# Patient Record
Sex: Female | Born: 1955
Health system: Southern US, Community
[De-identification: ages and names within clinical notes are randomized; demographics above are authoritative.]

## PROBLEM LIST (undated history)

## (undated) DIAGNOSIS — F32A Depression, unspecified: Secondary | ICD-10-CM

## (undated) DIAGNOSIS — K449 Diaphragmatic hernia without obstruction or gangrene: Secondary | ICD-10-CM

## (undated) DIAGNOSIS — I5030 Unspecified diastolic (congestive) heart failure: Secondary | ICD-10-CM

## (undated) DIAGNOSIS — E119 Type 2 diabetes mellitus without complications: Secondary | ICD-10-CM

## (undated) DIAGNOSIS — I509 Heart failure, unspecified: Secondary | ICD-10-CM

## (undated) DIAGNOSIS — I2510 Atherosclerotic heart disease of native coronary artery without angina pectoris: Secondary | ICD-10-CM

## (undated) DIAGNOSIS — K648 Other hemorrhoids: Secondary | ICD-10-CM

## (undated) DIAGNOSIS — F419 Anxiety disorder, unspecified: Secondary | ICD-10-CM

## (undated) DIAGNOSIS — G4733 Obstructive sleep apnea (adult) (pediatric): Secondary | ICD-10-CM

## (undated) DIAGNOSIS — E785 Hyperlipidemia, unspecified: Secondary | ICD-10-CM

## (undated) DIAGNOSIS — Z8619 Personal history of other infectious and parasitic diseases: Secondary | ICD-10-CM

## (undated) DIAGNOSIS — F410 Panic disorder [episodic paroxysmal anxiety] without agoraphobia: Secondary | ICD-10-CM

## (undated) DIAGNOSIS — I10 Essential (primary) hypertension: Secondary | ICD-10-CM

## (undated) DIAGNOSIS — I739 Peripheral vascular disease, unspecified: Secondary | ICD-10-CM

## (undated) DIAGNOSIS — K222 Esophageal obstruction: Secondary | ICD-10-CM

## (undated) DIAGNOSIS — K219 Gastro-esophageal reflux disease without esophagitis: Secondary | ICD-10-CM

## (undated) DIAGNOSIS — F329 Major depressive disorder, single episode, unspecified: Secondary | ICD-10-CM

## (undated) DIAGNOSIS — L570 Actinic keratosis: Secondary | ICD-10-CM

## (undated) HISTORY — DX: Heart failure, unspecified: I50.9

## (undated) HISTORY — PX: FOOT SURGERY: SHX648

## (undated) HISTORY — DX: Hyperlipidemia, unspecified: E78.5

## (undated) HISTORY — DX: Actinic keratosis: L57.0

## (undated) HISTORY — DX: Atherosclerotic heart disease of native coronary artery without angina pectoris: I25.10

## (undated) HISTORY — DX: Gastro-esophageal reflux disease without esophagitis: K21.9

## (undated) HISTORY — PX: BASAL CELL CARCINOMA EXCISION: SHX1214

## (undated) HISTORY — DX: Esophageal obstruction: K22.2

## (undated) HISTORY — DX: Depression, unspecified: F32.A

## (undated) HISTORY — DX: Peripheral vascular disease, unspecified: I73.9

## (undated) HISTORY — DX: Personal history of other infectious and parasitic diseases: Z86.19

## (undated) HISTORY — PX: REFRACTIVE SURGERY: SHX103

## (undated) HISTORY — DX: Anxiety disorder, unspecified: F41.9

## (undated) HISTORY — DX: Essential (primary) hypertension: I10

## (undated) HISTORY — DX: Unspecified diastolic (congestive) heart failure: I50.30

## (undated) HISTORY — DX: Diaphragmatic hernia without obstruction or gangrene: K44.9

## (undated) HISTORY — DX: Obstructive sleep apnea (adult) (pediatric): G47.33

## (undated) HISTORY — DX: Panic disorder (episodic paroxysmal anxiety): F41.0

## (undated) HISTORY — DX: Type 2 diabetes mellitus without complications: E11.9

## (undated) HISTORY — DX: Other hemorrhoids: K64.8

## (undated) HISTORY — PX: RIB RESECTION: SHX5077

## (undated) HISTORY — DX: Major depressive disorder, single episode, unspecified: F32.9

---

## 1996-04-02 HISTORY — PX: APPENDECTOMY: SHX54

## 1997-04-02 HISTORY — PX: AORTA SURGERY: SHX548

## 2003-04-03 HISTORY — PX: SHOULDER SURGERY: SHX246

## 2004-06-27 ENCOUNTER — Ambulatory Visit: Payer: Self-pay | Admitting: Gastroenterology

## 2004-07-11 ENCOUNTER — Ambulatory Visit: Payer: Self-pay | Admitting: Gastroenterology

## 2004-07-27 ENCOUNTER — Ambulatory Visit: Payer: Self-pay | Admitting: Gastroenterology

## 2004-08-11 ENCOUNTER — Ambulatory Visit: Payer: Self-pay | Admitting: Gastroenterology

## 2004-10-27 ENCOUNTER — Other Ambulatory Visit: Admission: RE | Admit: 2004-10-27 | Discharge: 2004-10-27 | Payer: Self-pay | Admitting: Obstetrics and Gynecology

## 2004-11-15 ENCOUNTER — Encounter: Admission: RE | Admit: 2004-11-15 | Discharge: 2004-11-15 | Payer: Self-pay | Admitting: Obstetrics and Gynecology

## 2005-12-17 ENCOUNTER — Other Ambulatory Visit: Admission: RE | Admit: 2005-12-17 | Discharge: 2005-12-17 | Payer: Self-pay | Admitting: Obstetrics and Gynecology

## 2005-12-25 ENCOUNTER — Encounter: Admission: RE | Admit: 2005-12-25 | Discharge: 2005-12-25 | Payer: Self-pay | Admitting: Obstetrics and Gynecology

## 2007-01-03 ENCOUNTER — Encounter: Admission: RE | Admit: 2007-01-03 | Discharge: 2007-01-03 | Payer: Self-pay | Admitting: Obstetrics and Gynecology

## 2008-02-16 ENCOUNTER — Encounter: Admission: RE | Admit: 2008-02-16 | Discharge: 2008-02-16 | Payer: Self-pay | Admitting: Obstetrics and Gynecology

## 2008-08-31 ENCOUNTER — Ambulatory Visit: Payer: Self-pay | Admitting: Family Medicine

## 2008-10-14 ENCOUNTER — Ambulatory Visit: Payer: Self-pay | Admitting: Family Medicine

## 2009-01-24 ENCOUNTER — Ambulatory Visit: Payer: Self-pay | Admitting: Family Medicine

## 2009-03-16 ENCOUNTER — Encounter: Admission: RE | Admit: 2009-03-16 | Discharge: 2009-03-16 | Payer: Self-pay | Admitting: Obstetrics and Gynecology

## 2009-12-08 ENCOUNTER — Ambulatory Visit: Payer: Self-pay | Admitting: Family Medicine

## 2010-01-30 ENCOUNTER — Ambulatory Visit: Payer: Self-pay | Admitting: Family Medicine

## 2010-02-06 ENCOUNTER — Ambulatory Visit: Payer: Self-pay | Admitting: Family Medicine

## 2010-03-02 HISTORY — PX: OTHER SURGICAL HISTORY: SHX169

## 2010-03-17 ENCOUNTER — Encounter
Admission: RE | Admit: 2010-03-17 | Discharge: 2010-03-17 | Payer: Self-pay | Source: Home / Self Care | Attending: Obstetrics and Gynecology | Admitting: Obstetrics and Gynecology

## 2011-04-12 ENCOUNTER — Ambulatory Visit: Payer: Self-pay | Admitting: Family Medicine

## 2011-04-20 ENCOUNTER — Ambulatory Visit: Payer: Self-pay | Admitting: Family Medicine

## 2011-05-09 ENCOUNTER — Encounter: Payer: Self-pay | Admitting: Pulmonary Disease

## 2011-05-10 ENCOUNTER — Encounter: Payer: Self-pay | Admitting: Pulmonary Disease

## 2011-05-10 ENCOUNTER — Ambulatory Visit (INDEPENDENT_AMBULATORY_CARE_PROVIDER_SITE_OTHER): Payer: 59 | Admitting: Pulmonary Disease

## 2011-05-10 VITALS — BP 124/76 | HR 90 | Temp 98.5°F | Ht 63.0 in | Wt 143.0 lb

## 2011-05-10 DIAGNOSIS — Z720 Tobacco use: Secondary | ICD-10-CM | POA: Insufficient documentation

## 2011-05-10 DIAGNOSIS — R079 Chest pain, unspecified: Secondary | ICD-10-CM | POA: Insufficient documentation

## 2011-05-10 DIAGNOSIS — R059 Cough, unspecified: Secondary | ICD-10-CM | POA: Insufficient documentation

## 2011-05-10 DIAGNOSIS — R05 Cough: Secondary | ICD-10-CM

## 2011-05-10 DIAGNOSIS — F17200 Nicotine dependence, unspecified, uncomplicated: Secondary | ICD-10-CM

## 2011-05-10 MED ORDER — INDOMETHACIN 25 MG PO CAPS: 25.0000 mg | ORAL_CAPSULE | Freq: Three times a day (TID) | ORAL | Status: AC | PRN

## 2011-05-10 MED ORDER — CYCLOBENZAPRINE HCL 10 MG PO TABS: 10.0000 mg | ORAL_TABLET | Freq: Three times a day (TID) | ORAL | Status: DC | PRN

## 2011-05-10 NOTE — Progress Notes (Signed)
Subjective:    Patient ID: Katelyn Cooper, female    DOB: 15-Apr-1955, 56 y.o.   MRN: [id number]  HPI This is a very pleasant 56 y/o female who comes to our office for evaluation of chest pain after recently having bronchitis.  She states that developed the cough in 03/2011 and was treated with prednisone and antibiotics.  She never had significant fever, but states that the cough was intense with frequent lengthy coughing spells.  The chest pain developed after several weeks of the cough and has not resolved since.  It is improved with prednisone, but only partially alleviated with high dose ibuprofen.  Her cough has since resolved but the pain persists.  The pain is located under her R axillary area and radiates anteriorly under her right breast.  It is worse with coughing and a deep breath.  She notes that the pain is worse with certain movements such as twisting, turning, pulling, reaching, etc.  No recent weight loss, fever, nausea, vomiting.  No hemoptysis.   She quit smoking this week.  Past Medical History  Diagnosis Date  . Depression      Family History  Problem Relation Age of Onset  . Heart disease Maternal Grandmother   . Lung cancer Mother     was a smoker  . Emphysema Mother   . Emphysema Sister     was a smoker     History   Social History  . Marital Status: Married    Spouse Name: N/A    Number of Children: 0  . Years of Education: N/A   Occupational History  .  Lab [employer]   Social History Main Topics  . Smoking status: Former Smoker -- 0.5 packs/day for 40 years    Types: Cigarettes    Quit date: 05/07/2011  . Smokeless tobacco: Never Used  . Alcohol Use: No  . Drug Use: No  . Sexually Active: Not on file   Other Topics Concern  . Not on file   Social History Narrative  . No narrative on file     Allergies  Allergen Reactions  . Paxil     itching     Outpatient Prescriptions Prior to Visit  Medication Sig Dispense Refill  . fluocinonide cream  (LIDEX) 0.05 % Apply topically as directed.      . lansoprazole (PREVACID) 15 MG capsule Take 15 mg by mouth daily.      Katelyn Kitchen venlafaxine (EFFEXOR-XR) 75 MG 24 hr capsule Take 150 mg by mouth daily.       Katelyn Kitchen ibuprofen (ADVIL,MOTRIN) 200 MG tablet Take 800 mg by mouth 3 (three) times daily.         Review of Systems  Constitutional: Negative for fever, chills and unexpected weight change.  HENT: Positive for congestion and postnasal drip. Negative for ear pain, nosebleeds, sore throat, rhinorrhea, sneezing, trouble swallowing, dental problem, voice change and sinus pressure.   Eyes: Negative for visual disturbance.  Respiratory: Positive for cough. Negative for choking and shortness of breath.   Cardiovascular: Negative for chest pain and leg swelling.  Gastrointestinal: Negative for vomiting, abdominal pain and diarrhea.  Genitourinary: Negative for difficulty urinating.  Musculoskeletal: Negative for arthralgias.  Skin: Negative for rash.  Neurological: Negative for tremors, syncope and headaches.  Hematological: Bruises/bleeds easily.       Objective:   Physical Exam  Filed Vitals:   05/10/11 1434  BP: 124/76  Pulse: 90  Temp: 98.5 F (36.9 C)  TempSrc: Oral  Height: 5\' 3"  (1.6 m)  Weight: 143 lb (64.864 kg)  SpO2: 96%    Gen: well appearing, no acute distress HEENT: NCAT, PERRL, EOMi, OP clear, neck supple without masses PULM: CTA B Chest: chest wall tenderness over 7th rib laterally and anteriorly CV: RRR, no mgr, no JVD AB: BS+, soft, nontender, no hsm Ext: warm, no edema, no clubbing, no cyanosis Derm: no rash or skin breakdown Neuro: A&Ox4, CN II-XII intact, strength 5/5 in all 4 extremities  Review of 04/2011 CT [facility]: mild to moderate emphysema noted, I question a posterior aspect fracture of the 7th rib (image 34); several old rib fractures on the right     Assessment & Plan:   Chest pain Ms. Cooper is quite concerned about her chest pain.  I explained  to her that I think the most likely etiology is either a muscle strain or a rib fracture. I reviewed her CT in depth and feel that she has a rib fracture in the 7th rib on the posterior aspect.  The radiology report does not comment on this, but this would fit clinically quite well.  Alternatively, if she does not have a rib fracture then a muscle strain is the next most likely etiology.   She has no parenchymal, pleural, or soft tissue abnormalities on her chest CT which are worrisome for underlying infectious or neoplastic causes.  I explained to her that I think this is musculoskeletal and that it could take weeks for this to improve.  If she does not have improvement then the next best study would be a bone scan to evaluate for another occult boney lesion.  Plan: -flexeril 10mg  po q8h (warned about drowsiness) -stop ibuprofen, start indomethacin -warm compresses tid  -call us if no improvement in three to four weeks -RTC 3 months  Cough She notes that this has resolved. Notably, she is a smoker and on her most recent chest CT she had emphysema.  Plan: -check PFT's to evaluate for COPD once chest pain resolved  Tobacco abuse Counseled to quit at length.  She quit on Monday for which we gave her encouragement today.  She is using Chantix.    Updated Medication List Outpatient Encounter Prescriptions as of 05/10/2011  Medication Sig Dispense Refill  . fluocinonide cream (LIDEX) 0.05 % Apply topically as directed.      . lansoprazole (PREVACID) 15 MG capsule Take 15 mg by mouth daily.      Katelyn Kitchen venlafaxine (EFFEXOR-XR) 75 MG 24 hr capsule Take 150 mg by mouth daily.       Katelyn Kitchen DISCONTD: ibuprofen (ADVIL,MOTRIN) 200 MG tablet Take 800 mg by mouth 3 (three) times daily.      . cyclobenzaprine (FLEXERIL) 10 MG tablet Take 1 tablet (10 mg total) by mouth 3 (three) times daily as needed.  50 tablet  0  . indomethacin (INDOCIN) 25 MG capsule Take 1 capsule (25 mg total) by mouth every 8 (eight)  hours as needed.  90 capsule  0

## 2011-05-10 NOTE — Assessment & Plan Note (Signed)
Ms. Satterwhite is quite concerned about her chest pain.  I explained to her that I think the most likely etiology is either a muscle strain or a rib fracture. I reviewed her CT in depth and feel that she has a rib fracture in the 7th rib on the posterior aspect.  The radiology report does not comment on this, but this would fit clinically quite well.  Alternatively, if she does not have a rib fracture then a muscle strain is the next most likely etiology.   She has no parenchymal, pleural, or soft tissue abnormalities on her chest CT which are worrisome for underlying infectious or neoplastic causes.  I explained to her that I think this is musculoskeletal and that it could take weeks for this to improve.  If she does not have improvement then the next best study would be a bone scan to evaluate for another occult boney lesion.  Plan: -flexeril 10mg  po q8h (warned about drowsiness) -stop ibuprofen, start indomethacin -warm compresses tid  -call us if no improvement in three to four weeks -RTC 3 months

## 2011-05-10 NOTE — Assessment & Plan Note (Signed)
Counseled to quit at length.  She quit on Monday for which we gave her encouragement today.  She is using Chantix.

## 2011-05-10 NOTE — Patient Instructions (Signed)
Stop ibuprofen Start taking indomethacin 25 mg every eight hours as needed for pain Use the flexeril 10mg  every eight hours as needed for pain, but note that this medication can make you drowsy, so do not take this and drive. Apply warm compresses to the area three times per day We will see you back in two months, but please call us if the pain has not resolved in 3-4 weeks

## 2011-05-10 NOTE — Assessment & Plan Note (Signed)
She notes that this has resolved. Notably, she is a smoker and on her most recent chest CT she had emphysema.  Plan: -check PFT's to evaluate for COPD once chest pain resolved

## 2011-05-15 ENCOUNTER — Telehealth: Payer: Self-pay | Admitting: Pulmonary Disease

## 2011-05-15 NOTE — Telephone Encounter (Signed)
Returning call can be reached at [phone].Raylene Everts

## 2011-05-15 NOTE — Telephone Encounter (Signed)
Called spoke with patient who reported that she "woke up in the middle of the night" last night with a dry cough.  Took delsym with no relief.  Denies any wheezing, increased SOB or congestion.  Pt stated she has a rib fracture and is requesting additional recs.  Per 2.7.13 ov with Dr Kendrick Fries:  Review of 04/2011 CT [facility]: mild to moderate emphysema noted, I question a posterior aspect fracture of the 7th rib (image 77); several old rib fractures on the right  Dr Kriste Basque please advise, thanks.  Allergies  Allergen Reactions  . Paxil     itching   [facility] in [place]

## 2011-05-15 NOTE — Telephone Encounter (Signed)
LMOM for pt TCB 

## 2011-05-15 NOTE — Telephone Encounter (Signed)
Per SN: okay for tussionex #4oz - 1 tsp po q12 prn cough and follow up with Dr Kendrick Fries.  LMOM TCB x1 at both home and work numbers.

## 2011-05-16 MED ORDER — HYDROCOD POLST-CHLORPHEN POLST 10-8 MG/5ML PO LQCR: ORAL | Status: DC

## 2011-05-16 NOTE — Telephone Encounter (Signed)
I spoke with pt and is aware of SN recs. She voiced her understanding and rx has been [facility] per pt request. Will forward to Dr. Kendrick Fries as an Lorain Childes

## 2011-05-16 NOTE — Telephone Encounter (Signed)
noted 

## 2011-06-21 ENCOUNTER — Other Ambulatory Visit: Payer: Self-pay | Admitting: Obstetrics and Gynecology

## 2011-06-21 DIAGNOSIS — Z1231 Encounter for screening mammogram for malignant neoplasm of breast: Secondary | ICD-10-CM

## 2011-06-28 ENCOUNTER — Ambulatory Visit
Admission: RE | Admit: 2011-06-28 | Discharge: 2011-06-28 | Disposition: A | Discharge status: Home / Self Care | Payer: 59 | Source: Ambulatory Visit | Attending: Obstetrics and Gynecology | Admitting: Obstetrics and Gynecology

## 2011-06-28 DIAGNOSIS — Z1231 Encounter for screening mammogram for malignant neoplasm of breast: Secondary | ICD-10-CM

## 2011-07-19 ENCOUNTER — Encounter: Payer: Self-pay | Admitting: Pulmonary Disease

## 2011-07-19 ENCOUNTER — Ambulatory Visit (INDEPENDENT_AMBULATORY_CARE_PROVIDER_SITE_OTHER): Payer: 59 | Admitting: Pulmonary Disease

## 2011-07-19 VITALS — BP 140/66 | HR 100 | Temp 98.8°F | Ht 63.0 in | Wt 147.0 lb

## 2011-07-19 DIAGNOSIS — R05 Cough: Secondary | ICD-10-CM

## 2011-07-19 DIAGNOSIS — R059 Cough, unspecified: Secondary | ICD-10-CM

## 2011-07-19 DIAGNOSIS — R079 Chest pain, unspecified: Secondary | ICD-10-CM

## 2011-07-19 DIAGNOSIS — Z720 Tobacco use: Secondary | ICD-10-CM

## 2011-07-19 DIAGNOSIS — F17200 Nicotine dependence, unspecified, uncomplicated: Secondary | ICD-10-CM

## 2011-07-19 NOTE — Assessment & Plan Note (Signed)
She has done great since quitting and has remained off of cigarettes.  Plan: -Encouraged to stay off cigarettes

## 2011-07-19 NOTE — Patient Instructions (Signed)
You do not have COPD. We are glad you quit smoking and please stay away from them! We will see you back as needed

## 2011-07-19 NOTE — Progress Notes (Signed)
Subjective:    Patient ID: Katelyn Cooper, female    DOB: Jun 19, 1955, 56 y.o.   MRN: [id number]  Synopsis: Katelyn Cooper first came to the [facility] office in February 2013 for evaluation of chest pain that developed after an episode of bronchitis.  I was concerned that she possibly had a fractured rib that was visible on CT, but [facility] radiology did not see this.  We started flexeril, indomethacin and warm compresses.  She is a smoker trying to quit with Chantix.  HPI  07/19/11 ROV -- She states that she has been doing remarkably well since our last visit. The indomethacin worked very well for relieving the pain which is now gone. She no longer has a cough. She notes very occasional dyspnea on exertion after climbing flights of starts, but can participate in most normal activities without limitations.  She had cough this year for several months, but this is the first time that has occurred.    She has not touched cigarettes since 05/04/2011.  Past Medical History  Diagnosis Date  . Depression       Review of Systems  Constitutional: Negative for fever, chills and unexpected weight change.  HENT: Negative for ear pain, nosebleeds, sore throat, rhinorrhea, sneezing, trouble swallowing, dental problem, voice change and sinus pressure.   Respiratory: Negative for choking and shortness of breath.   Cardiovascular: Negative for chest pain and leg swelling.       Objective:   Physical Exam   There were no vitals filed for this visit.  Gen: well appearing, no acute distress HEENT: NCAT, PERRL, EOMi, OP clear, neck supple without masses PULM: CTA B Chest: normal CV: RRR, no mgr, no JVD AB: BS+, soft, nontender, no hsm Ext: warm, no edema, no clubbing, no cyanosis Derm: no rash or skin breakdown Neuro: A&Ox4, CN II-XII intact, strength 5/5 in all 4 extremities  Review of 04/2011 CT [facility]: mild to moderate emphysema noted, I question a posterior aspect fracture of the 7th rib  (image 58); several old rib fractures on the right  07/2011 Simple spirometry: normal     Assessment & Plan:   Chest pain Resolved, I suspect that this represented a musculoskeletal issue (either a muscle strain or broken rib).  She had no clear evidence of pathology on imaging to explain otherwise.    Plan: -f/u with [name] prn or call back if pain recurrs  Tobacco abuse She has done great since quitting and has remained off of cigarettes.  Plan: -Encouraged to stay off cigarettes  Cough This has also resolved. She has a strong smoking history and states that she normally gets bronchitis annually.  She has only had one episode lasting three months, so she really doesn't have chronic bronchitis, but is certainly at risk.    Plan: -we checked simple spirometry today in clinic and it was normal -f/u with [name] prn     Updated Medication List Outpatient Encounter Prescriptions as of 07/19/2011  Medication Sig Dispense Refill  . chlorpheniramine-HYDROcodone (TUSSIONEX PENNKINETIC ER) 10-8 MG/5ML LQCR Take 1 tsp my mouth every 12 hrs as needed for cough  120 mL  0  . cyclobenzaprine (FLEXERIL) 10 MG tablet Take 1 tablet (10 mg total) by mouth 3 (three) times daily as needed.  50 tablet  0  . fluocinonide cream (LIDEX) 0.05 % Apply topically as directed.      . indomethacin (INDOCIN) 25 MG capsule Take 1 capsule (25 mg total) by mouth every 8 (  eight) hours as needed.  90 capsule  0  . lansoprazole (PREVACID) 15 MG capsule Take 15 mg by mouth daily.      [name] venlafaxine (EFFEXOR-XR) 75 MG 24 hr capsule Take 150 mg by mouth daily.

## 2011-07-19 NOTE — Assessment & Plan Note (Signed)
Resolved, I suspect that this represented a musculoskeletal issue (either a muscle strain or broken rib).  She had no clear evidence of pathology on imaging to explain otherwise.    Plan: -f/u with [name] prn or call back if pain recurrs

## 2011-07-19 NOTE — Assessment & Plan Note (Addendum)
This has also resolved. She has a strong smoking history and states that she normally gets bronchitis annually.  She has only had one episode lasting three months, so she really doesn't have chronic bronchitis, but is certainly at risk.    Plan: -we checked simple spirometry today in clinic and it was normal -f/u with [name] prn

## 2011-08-13 DIAGNOSIS — E119 Type 2 diabetes mellitus without complications: Secondary | ICD-10-CM | POA: Insufficient documentation

## 2011-08-15 ENCOUNTER — Encounter: Payer: Self-pay | Admitting: Gastroenterology

## 2011-08-24 ENCOUNTER — Ambulatory Visit: Payer: Self-pay | Admitting: Family Medicine

## 2011-09-01 ENCOUNTER — Ambulatory Visit: Payer: Self-pay | Admitting: Family Medicine

## 2011-09-05 ENCOUNTER — Encounter: Payer: Self-pay | Admitting: Internal Medicine

## 2011-09-05 ENCOUNTER — Encounter: Payer: Self-pay | Admitting: Gastroenterology

## 2011-09-05 ENCOUNTER — Ambulatory Visit (INDEPENDENT_AMBULATORY_CARE_PROVIDER_SITE_OTHER): Payer: 59 | Admitting: Gastroenterology

## 2011-09-05 VITALS — BP 124/60 | HR 96 | Ht 63.0 in | Wt 145.4 lb

## 2011-09-05 DIAGNOSIS — K219 Gastro-esophageal reflux disease without esophagitis: Secondary | ICD-10-CM

## 2011-09-05 DIAGNOSIS — Z1211 Encounter for screening for malignant neoplasm of colon: Secondary | ICD-10-CM

## 2011-09-05 NOTE — Progress Notes (Signed)
History of Present Illness: This is a 56 year old female who has a history of GERD and peptic stricture with her last dilation in April 2006. She's been off acid medications and has had no gastrointestinal complaints for several years. Over the past several months she has noted gradually worsening solid food dysphagia. She has had to induce vomiting on several occasions to clear food impactions. She started over-the-counter Prevacid several weeks ago without any change in symptoms. Denies weight loss, abdominal pain, constipation, diarrhea, change in stool caliber, melena, hematochezia, reflux symptoms, chest pain.  Allergies  Allergen Reactions  . Paroxetine Hcl     itching   Outpatient Prescriptions Prior to Visit  Medication Sig Dispense Refill  . fluocinonide cream (LIDEX) 0.05 % Apply topically as needed.       . lansoprazole (PREVACID) 15 MG capsule Take 15 mg by mouth daily.      [name] venlafaxine (EFFEXOR-XR) 75 MG 24 hr capsule Take 150 mg by mouth daily.       . chlorpheniramine-HYDROcodone (TUSSIONEX PENNKINETIC ER) 10-8 MG/5ML LQCR Take 1 tsp my mouth every 12 hrs as needed for cough  120 mL  0  . cyclobenzaprine (FLEXERIL) 10 MG tablet Take 1 tablet (10 mg total) by mouth 3 (three) times daily as needed.  50 tablet  0   Past Medical History  Diagnosis Date  . Depression   . Anxiety   . Panic disorder   . Internal hemorrhoids   . Esophageal stricture   . GERD (gastroesophageal reflux disease)   . Diabetes type 2, controlled   . Hyperlipidemia    Past Surgical History  Procedure Date  . Shoulder surgery 2005    left  . Aorta surgery 1999    stent placement  . Appendectomy 1998  . Rib resection [id number]  . Refractive surgery     right  . Foot surgery     Right   History   Social History  . Marital Status: Married    Spouse Name: N/A    Number of Children: 0  . Years of Education: N/A   Occupational History  . computer [employer]   Social History Main Topics  .  Smoking status: Former Smoker -- 0.5 packs/day for 40 years    Types: Cigarettes    Quit date: 05/07/2011  . Smokeless tobacco: Never Used  . Alcohol Use: No  . Drug Use: No  . Sexually Active: None   Other Topics Concern  . None   Social History Narrative  . None   Family History  Problem Relation Age of Onset  . Heart disease Maternal Grandmother   . Lung cancer Mother     was a smoker  . Emphysema Mother   . Emphysema Sister     was a smoker  . Breast cancer Sister   . COPD Sister   . Esophageal cancer Maternal Aunt     Review of Systems: Pertinent positive and negative review of systems were noted in the above HPI section. All other review of systems were otherwise negative.   Physical Exam: General: Well developed , well nourished, no acute distress Head: Normocephalic and atraumatic Eyes:  sclerae anicteric, EOMI Ears: Normal auditory acuity Mouth: No deformity or lesions Neck: Supple, no masses or thyromegaly Lungs: Clear throughout to auscultation Heart: Regular rate and rhythm; no murmurs, rubs or bruits Abdomen: Soft, non tender and non distended. No masses, hepatosplenomegaly or hernias noted. Normal Bowel sounds Musculoskeletal: Symmetrical with no gross deformities  Skin: No lesions on visible extremities Pulses:  Normal pulses noted Extremities: No clubbing, cyanosis, edema or deformities noted Neurological: Alert oriented x 4, grossly nonfocal Cervical Nodes:  No significant cervical adenopathy Inguinal Nodes: No significant inguinal adenopathy Psychological:  Alert and cooperative. Normal mood and affect  Assessment and Recommendations:  1. Solid food dysphagia. Presumed recurrent peptic stricture. Continue all standard antireflux measures and Prevacid 15 mg daily. Schedule endoscopy with dilation. The risks, benefits, and alternatives to endoscopy with possible biopsy and possible dilation were discussed with the patient and they consent to proceed.    2. Colorectal cancer screening, average risk. Colonoscopy due may 2016.

## 2011-09-05 NOTE — Patient Instructions (Addendum)
You have been scheduled for an endoscopy with propofol. Please follow written instructions given to you at your visit today.  Dr. Russella Dar would like you to stay on your Prevacid, daily   CC: Dr. Mila Merry

## 2011-09-19 ENCOUNTER — Ambulatory Visit (AMBULATORY_SURGERY_CENTER): Payer: 59 | Admitting: Gastroenterology

## 2011-09-19 ENCOUNTER — Encounter: Payer: Self-pay | Admitting: Gastroenterology

## 2011-09-19 VITALS — BP 122/63 | HR 80 | Temp 98.5°F | Resp 13 | Ht 63.0 in | Wt 145.0 lb

## 2011-09-19 DIAGNOSIS — K219 Gastro-esophageal reflux disease without esophagitis: Secondary | ICD-10-CM

## 2011-09-19 DIAGNOSIS — K222 Esophageal obstruction: Secondary | ICD-10-CM

## 2011-09-19 DIAGNOSIS — R1319 Other dysphagia: Secondary | ICD-10-CM

## 2011-09-19 HISTORY — PX: UPPER GI ENDOSCOPY: SHX6162

## 2011-09-19 MED ORDER — SODIUM CHLORIDE 0.9 % IV SOLN: 500.0000 mL | INTRAVENOUS | Status: DC

## 2011-09-19 NOTE — Op Note (Signed)
[facility] [address] [place], [place]  [address]  ENDOSCOPY PROCEDURE REPORT PATIENT:  Katelyn Cooper, Katelyn Cooper  MR#:  [id number] BIRTHDATE:  11-08-1955, 56 yrs. old  GENDER:  female ENDOSCOPIST:  Judie Petit T. Russella Dar, MD, [facility]  PROCEDURE DATE:  09/19/2011 PROCEDURE:  EGD with dilatation over guidewire ASA CLASS:  Class II INDICATIONS:  dysphagia, GERD MEDICATIONS:  MAC sedation, administered by CRNA, propofol (Diprivan) 220 mg IV TOPICAL ANESTHETIC:  none DESCRIPTION OF PROCEDURE:   After the risks benefits and alternatives of the procedure were thoroughly explained, informed consent was obtained.  The [facility] [id number] endoscope was introduced through the mouth and advanced to the second portion of the duodenum, without limitations.  The instrument was slowly withdrawn as the mucosa was fully examined. <<PROCEDUREIMAGES>> A stricture was found at the gastroesophageal junction. It was benign appearing and circumferential. It was 13 mm in diameter. Savary / guidewire with 14 mm, 15 mm and 16 mm with minimal resistance to each dilator and moderate heme on last 2 dilators. Otherwise normal esophagus.  Mild gastritis was found in the antrum. It was erosive and erythematous.  Otherwise normal stomach. Mild duodenitis was found in the bulb of the duodenum. It was erythematous.  Otherwise normal duodenum.  Retroflexed views revealed a hiatal hernia.  The scope was then withdrawn from the patient and the procedure completed. COMPLICATIONS:  None  ENDOSCOPIC IMPRESSION: 1) Stricture at the gastroesophageal junction 2) Mild gastritis 3) Mild duodenitis 4) Small hiatal hernia  RECOMMENDATIONS: 1) Anti-reflux regimen long term 2) PPI qam long term 3) Post dilation instructions  [name] T. Russella Dar, MD, [facility]  CC:  Mila Merry, MD  n. Rosalie DoctorVenita Lick. Valisa Karpel at 09/19/2011 01:21 PM  Silvestre Mesi, [phone]

## 2011-09-19 NOTE — Progress Notes (Signed)
Patient did not have preoperative order for IV antibiotic SSI prophylaxis. (G8918)  Patient did not experience any of the following events: a burn prior to discharge; a fall within the facility; wrong site/side/patient/procedure/implant event; or a hospital transfer or hospital admission upon discharge from the facility. (G8907)  

## 2011-09-19 NOTE — Patient Instructions (Signed)

## 2011-09-20 ENCOUNTER — Telehealth: Payer: Self-pay | Admitting: *Deleted

## 2011-09-20 NOTE — Telephone Encounter (Signed)
Left message to call if questions or concerns.  

## 2011-12-26 ENCOUNTER — Ambulatory Visit: Payer: Self-pay | Admitting: Family Medicine

## 2012-01-01 ENCOUNTER — Ambulatory Visit: Payer: Self-pay | Admitting: Family Medicine

## 2012-02-01 ENCOUNTER — Ambulatory Visit: Payer: Self-pay | Admitting: Family Medicine

## 2012-04-29 ENCOUNTER — Telehealth: Payer: Self-pay | Admitting: Obstetrics and Gynecology

## 2012-04-29 NOTE — Telephone Encounter (Signed)
AR pt 

## 2012-04-30 ENCOUNTER — Telehealth: Payer: Self-pay

## 2012-04-30 NOTE — Telephone Encounter (Signed)
LM for pt that per Dr. Su Hilt, we have refilled both her Nystatin/Triamcinolone cream to use topically prn to affected area  And her Desoximetasone topical cream to apply to affected area prn. No further refills  Til pt is seen for AEX. Melody Comas A

## 2012-08-05 ENCOUNTER — Encounter: Payer: Self-pay | Admitting: Gastroenterology

## 2012-12-10 ENCOUNTER — Encounter (INDEPENDENT_AMBULATORY_CARE_PROVIDER_SITE_OTHER): Payer: 59 | Admitting: Ophthalmology

## 2012-12-10 DIAGNOSIS — H33109 Unspecified retinoschisis, unspecified eye: Secondary | ICD-10-CM

## 2012-12-10 DIAGNOSIS — H43819 Vitreous degeneration, unspecified eye: Secondary | ICD-10-CM

## 2012-12-10 DIAGNOSIS — H2510 Age-related nuclear cataract, unspecified eye: Secondary | ICD-10-CM

## 2012-12-10 DIAGNOSIS — E11319 Type 2 diabetes mellitus with unspecified diabetic retinopathy without macular edema: Secondary | ICD-10-CM

## 2012-12-10 DIAGNOSIS — E1139 Type 2 diabetes mellitus with other diabetic ophthalmic complication: Secondary | ICD-10-CM

## 2013-05-11 ENCOUNTER — Other Ambulatory Visit: Payer: Self-pay

## 2013-05-11 DIAGNOSIS — Z1231 Encounter for screening mammogram for malignant neoplasm of breast: Secondary | ICD-10-CM

## 2013-05-25 ENCOUNTER — Ambulatory Visit
Admission: RE | Admit: 2013-05-25 | Discharge: 2013-05-25 | Disposition: A | Discharge status: Home / Self Care | Payer: 59 | Source: Ambulatory Visit

## 2013-05-25 DIAGNOSIS — Z1231 Encounter for screening mammogram for malignant neoplasm of breast: Secondary | ICD-10-CM

## 2013-06-10 ENCOUNTER — Ambulatory Visit (INDEPENDENT_AMBULATORY_CARE_PROVIDER_SITE_OTHER): Payer: 59 | Admitting: Ophthalmology

## 2013-06-10 DIAGNOSIS — H33109 Unspecified retinoschisis, unspecified eye: Secondary | ICD-10-CM

## 2013-06-10 DIAGNOSIS — H43819 Vitreous degeneration, unspecified eye: Secondary | ICD-10-CM

## 2013-06-10 DIAGNOSIS — H2510 Age-related nuclear cataract, unspecified eye: Secondary | ICD-10-CM

## 2013-06-10 DIAGNOSIS — E11319 Type 2 diabetes mellitus with unspecified diabetic retinopathy without macular edema: Secondary | ICD-10-CM

## 2013-06-10 DIAGNOSIS — E1139 Type 2 diabetes mellitus with other diabetic ophthalmic complication: Secondary | ICD-10-CM

## 2013-06-10 DIAGNOSIS — E1165 Type 2 diabetes mellitus with hyperglycemia: Secondary | ICD-10-CM

## 2013-09-14 ENCOUNTER — Ambulatory Visit: Payer: Self-pay | Admitting: Family Medicine

## 2013-11-03 DIAGNOSIS — E559 Vitamin D deficiency, unspecified: Secondary | ICD-10-CM | POA: Insufficient documentation

## 2013-12-10 LAB — CBC AND DIFFERENTIAL
HEMATOCRIT: 42 % (ref 36–46)
Hemoglobin: 14.9 g/dL (ref 12.0–16.0)
PLATELETS: 257 10*3/uL (ref 150–399)
WBC: 8.9 10^3/mL

## 2013-12-10 LAB — BASIC METABOLIC PANEL
BUN: 12 mg/dL (ref 4–21)
CREATININE: 0.9 mg/dL (ref 0.5–1.1)
GLUCOSE: 92 mg/dL
Potassium: 4.3 mmol/L (ref 3.4–5.3)
SODIUM: 141 mmol/L (ref 137–147)

## 2013-12-10 LAB — TSH: TSH: 1.26 u[IU]/mL (ref 0.41–5.90)

## 2013-12-10 LAB — HEPATIC FUNCTION PANEL
ALT: 16 U/L (ref 7–35)
AST: 15 U/L (ref 13–35)

## 2013-12-16 ENCOUNTER — Ambulatory Visit (INDEPENDENT_AMBULATORY_CARE_PROVIDER_SITE_OTHER): Payer: 59 | Admitting: Ophthalmology

## 2013-12-16 DIAGNOSIS — E1165 Type 2 diabetes mellitus with hyperglycemia: Secondary | ICD-10-CM

## 2013-12-16 DIAGNOSIS — H2510 Age-related nuclear cataract, unspecified eye: Secondary | ICD-10-CM

## 2013-12-16 DIAGNOSIS — H33109 Unspecified retinoschisis, unspecified eye: Secondary | ICD-10-CM

## 2013-12-16 DIAGNOSIS — E11319 Type 2 diabetes mellitus with unspecified diabetic retinopathy without macular edema: Secondary | ICD-10-CM

## 2013-12-16 DIAGNOSIS — E1139 Type 2 diabetes mellitus with other diabetic ophthalmic complication: Secondary | ICD-10-CM

## 2013-12-16 DIAGNOSIS — H43819 Vitreous degeneration, unspecified eye: Secondary | ICD-10-CM

## 2013-12-18 ENCOUNTER — Ambulatory Visit: Payer: Self-pay | Admitting: Family Medicine

## 2014-01-06 ENCOUNTER — Other Ambulatory Visit: Payer: Self-pay | Admitting: Orthopedic Surgery

## 2014-01-06 DIAGNOSIS — M542 Cervicalgia: Secondary | ICD-10-CM

## 2014-01-15 ENCOUNTER — Ambulatory Visit
Admission: RE | Admit: 2014-01-15 | Discharge: 2014-01-15 | Disposition: A | Discharge status: Home / Self Care | Payer: 59 | Source: Ambulatory Visit | Attending: Orthopedic Surgery | Admitting: Orthopedic Surgery

## 2014-01-15 DIAGNOSIS — M542 Cervicalgia: Secondary | ICD-10-CM

## 2014-04-16 ENCOUNTER — Ambulatory Visit: Payer: Self-pay | Admitting: Family Medicine

## 2014-06-05 ENCOUNTER — Encounter: Payer: Self-pay | Admitting: Gastroenterology

## 2014-09-10 ENCOUNTER — Other Ambulatory Visit: Payer: Self-pay | Admitting: Family Medicine

## 2014-10-15 ENCOUNTER — Encounter: Payer: Self-pay | Admitting: Family Medicine

## 2014-10-15 ENCOUNTER — Ambulatory Visit (INDEPENDENT_AMBULATORY_CARE_PROVIDER_SITE_OTHER): Payer: 59 | Admitting: Family Medicine

## 2014-10-15 VITALS — BP 110/66 | HR 102 | Temp 98.3°F | Resp 16 | Wt 153.0 lb

## 2014-10-15 DIAGNOSIS — D2322 Other benign neoplasm of skin of left ear and external auricular canal: Secondary | ICD-10-CM | POA: Diagnosis not present

## 2014-10-15 NOTE — Progress Notes (Signed)
Subjective:     Patient ID: Katelyn Cooper, female   DOB: 1955-12-18, 59 y.o.   MRN: [id number]  HPI  Chief Complaint  Patient presents with  . Skin Problem    Patient comes in office today with concerns of skin changes, she states that 3 days ago a bump had appeared behind her left ear and has grown in size  States she had a cold 3 weeks ago. Found the nodule behind her left ear accidentally. Denies tenderness or drainage. No injury to scalp or ear reported.   Review of Systems  Constitutional: Negative for fever and chills.  Skin:       No prior hx of skin cysts       Objective:   Physical Exam  Constitutional: She appears well-developed and well-nourished. No distress.  HENT:  Left Ear: Tympanic membrane normal.  Skin:  0.5 cm nodule behind her left ear at inferior aspect.Non- tender with no overlying erythema or sinus tract       Assessment:    1. Cyst, dermoid, ear, left- also consider reactive lymph node     Plan:    monitor. May call in one week regarding any changes.

## 2014-10-15 NOTE — Patient Instructions (Signed)
Monitor for a week. If getting larger or more tender or simply not going away call us.

## 2014-10-22 ENCOUNTER — Telehealth: Payer: Self-pay | Admitting: Family Medicine

## 2014-10-22 ENCOUNTER — Other Ambulatory Visit: Payer: Self-pay | Admitting: Family Medicine

## 2014-10-22 DIAGNOSIS — I889 Nonspecific lymphadenitis, unspecified: Secondary | ICD-10-CM

## 2014-10-22 MED ORDER — AMOXICILLIN 875 MG PO TABS: 875.0000 mg | ORAL_TABLET | Freq: Two times a day (BID) | ORAL | Status: DC

## 2014-10-22 NOTE — Telephone Encounter (Signed)
Pt states she was in last Friday with swollen glands.  Pt states she is not any better and is requesting a Rx to help with this.  [place].  [phone]

## 2014-10-22 NOTE — Telephone Encounter (Signed)
Patient has been advised. KW 

## 2014-10-22 NOTE — Telephone Encounter (Signed)
Have sent in amoxicillin-this may help if this is a reactive lymph node. If just a cyst, you will see little change.

## 2014-10-22 NOTE — Telephone Encounter (Signed)
Please advise 

## 2014-12-17 ENCOUNTER — Ambulatory Visit (INDEPENDENT_AMBULATORY_CARE_PROVIDER_SITE_OTHER): Payer: 59 | Admitting: Ophthalmology

## 2014-12-23 ENCOUNTER — Ambulatory Visit (INDEPENDENT_AMBULATORY_CARE_PROVIDER_SITE_OTHER): Payer: 59 | Admitting: Family Medicine

## 2014-12-23 ENCOUNTER — Encounter: Payer: Self-pay | Admitting: Family Medicine

## 2014-12-23 VITALS — BP 120/70 | HR 95 | Temp 98.7°F | Resp 16 | Wt 149.0 lb

## 2014-12-23 DIAGNOSIS — E876 Hypokalemia: Secondary | ICD-10-CM | POA: Diagnosis not present

## 2014-12-23 DIAGNOSIS — Z862 Personal history of diseases of the blood and blood-forming organs and certain disorders involving the immune mechanism: Secondary | ICD-10-CM | POA: Insufficient documentation

## 2014-12-23 DIAGNOSIS — R270 Ataxia, unspecified: Secondary | ICD-10-CM | POA: Insufficient documentation

## 2014-12-23 DIAGNOSIS — F329 Major depressive disorder, single episode, unspecified: Secondary | ICD-10-CM | POA: Insufficient documentation

## 2014-12-23 DIAGNOSIS — F419 Anxiety disorder, unspecified: Secondary | ICD-10-CM | POA: Insufficient documentation

## 2014-12-23 DIAGNOSIS — F32A Depression, unspecified: Secondary | ICD-10-CM | POA: Insufficient documentation

## 2014-12-23 DIAGNOSIS — R8271 Bacteriuria: Secondary | ICD-10-CM

## 2014-12-23 DIAGNOSIS — Z23 Encounter for immunization: Secondary | ICD-10-CM

## 2014-12-23 DIAGNOSIS — M545 Low back pain, unspecified: Secondary | ICD-10-CM

## 2014-12-23 DIAGNOSIS — L309 Dermatitis, unspecified: Secondary | ICD-10-CM | POA: Insufficient documentation

## 2014-12-23 DIAGNOSIS — K219 Gastro-esophageal reflux disease without esophagitis: Secondary | ICD-10-CM | POA: Insufficient documentation

## 2014-12-23 DIAGNOSIS — F39 Unspecified mood [affective] disorder: Secondary | ICD-10-CM | POA: Insufficient documentation

## 2014-12-23 DIAGNOSIS — E7800 Pure hypercholesterolemia, unspecified: Secondary | ICD-10-CM | POA: Insufficient documentation

## 2014-12-23 DIAGNOSIS — S161XXA Strain of muscle, fascia and tendon at neck level, initial encounter: Secondary | ICD-10-CM | POA: Insufficient documentation

## 2014-12-23 DIAGNOSIS — M549 Dorsalgia, unspecified: Secondary | ICD-10-CM | POA: Insufficient documentation

## 2014-12-23 DIAGNOSIS — N898 Other specified noninflammatory disorders of vagina: Secondary | ICD-10-CM

## 2014-12-23 DIAGNOSIS — F439 Reaction to severe stress, unspecified: Secondary | ICD-10-CM | POA: Insufficient documentation

## 2014-12-23 DIAGNOSIS — R251 Tremor, unspecified: Secondary | ICD-10-CM | POA: Insufficient documentation

## 2014-12-23 DIAGNOSIS — N390 Urinary tract infection, site not specified: Secondary | ICD-10-CM

## 2014-12-23 DIAGNOSIS — N12 Tubulo-interstitial nephritis, not specified as acute or chronic: Secondary | ICD-10-CM | POA: Insufficient documentation

## 2014-12-23 DIAGNOSIS — J309 Allergic rhinitis, unspecified: Secondary | ICD-10-CM | POA: Insufficient documentation

## 2014-12-23 MED ORDER — POTASSIUM CHLORIDE ER 10 MEQ PO TBCR: 10.0000 meq | EXTENDED_RELEASE_TABLET | Freq: Every day | ORAL | Status: DC

## 2014-12-23 MED ORDER — CYCLOBENZAPRINE HCL 5 MG PO TABS: 5.0000 mg | ORAL_TABLET | Freq: Three times a day (TID) | ORAL | Status: DC | PRN

## 2014-12-23 NOTE — Progress Notes (Signed)
Patient: Katelyn Cooper Female    DOB: 1955-07-08   59 y.o.   MRN: [id number] Visit Date: 12/23/2014  Today's Provider: Lelon Huh, MD   Chief Complaint  Patient presents with  . Back Pain   Subjective:    HPI  Patient was seen by Dr. Ulis Rias 12/20/2014 for routing follow up of depression.. Dr. Bridgett Larsson did labs, potassium came back low 3.3 and UA revealed 6-10 WBC/hpf with few bacteria. She has had no dysuria, frequency or fever. U/a was done for routine follow up of her medication by patient report. She denies any change in her diet. She is lithium prescribed by Dr. Bridgett Larsson  Low Back Pain She reports having mild low back pain for years, mainly on the left side. It has gradually worsened to the point it is sometimes limiting her activity. She has been seeing Chiropractor and tried using a TENs units. Chiropractic treatment help,but is time consuming. No radiation into legs. No recent injury.   Allergies  Allergen Reactions  . Lovastatin     depression  . Paroxetine Hcl     itching   Previous Medications   ARIPIPRAZOLE (ABILIFY PO)    Take 1 tablet by mouth daily.   CETIRIZINE (ZYRTEC ALLERGY) 10 MG TABLET    Take 1 tablet by mouth daily.   CHOLECALCIFEROL (VITAMIN D PO)    Take 2 capsules by mouth daily.   FLUOCINONIDE CREAM (LIDEX) 0.05 %    Apply topically as needed.    GLUCOSE BLOOD TEST STRIP    1 each by Other route as needed for other (ONE TOUCH ULTRA TEST STRIPS AND LANCETS). Use as instructed   IBUPROFEN (ADVIL) 200 MG TABLET    Take 200 mg by mouth as needed.   LANSOPRAZOLE (PREVACID) 15 MG CAPSULE    Take 15 mg by mouth daily.   LITHIUM CARBONATE 300 MG CAPSULE    take 1 capsule by mouth two times a day   PRAVASTATIN (PRAVACHOL) 40 MG TABLET    Take 1 tablet (40 mg total) by mouth daily.   SEROQUEL XR 50 MG TB24 24 HR TABLET       TRIAMCINOLONE ACETONIDE (TRIAMCINOLONE 0.1 % CREAM : EUCERIN) CREA    Apply 1 application topically daily.   VENLAFAXINE (EFFEXOR-XR)  75 MG 24 HR CAPSULE    Take 75 mg by mouth 3 (three) times daily.     Review of Systems  Constitutional: Positive for fatigue.  Cardiovascular: Negative for chest pain and palpitations.  Gastrointestinal: Negative for nausea and abdominal pain.  Genitourinary: Negative for urgency, frequency, vaginal bleeding, vaginal discharge, difficulty urinating, vaginal pain and pelvic pain.  Musculoskeletal: Positive for back pain.  Neurological: Negative for dizziness and light-headedness.    Social History  Substance Use Topics  . Smoking status: Former Smoker -- 0.50 packs/day for 40 years    Types: Cigarettes    Quit date: 05/07/2011  . Smokeless tobacco: Never Used  . Alcohol Use: No   Objective:   BP 120/70 mmHg  Pulse 95  Temp(Src) 98.7 F (37.1 C) (Oral)  Resp 16  Wt 149 lb (67.586 kg)  SpO2 96%  Physical Exam  General appearance: alert, well developed, well nourished, cooperative and in no distress Head: Normocephalic, without obvious abnormality, atraumatic Lungs: Respirations even and unlabored Extremities: No gross deformities Skin: Skin color, texture, turgor normal. No rashes seen  Psych: Appropriate mood and affect. Neurologic: Mental status: Alert, oriented to person, place, and  time, thought content appropriate. MS: No lumbar spine tenderness. Moderated tenderness of left paralumbar muscles with some muscle swelling. No erythema.  Abd: No CVA tenderness    Assessment & Plan:     1. Left-sided low back pain without sciatica Musculoskeletal. Has not tried muscle relaxer. Advised may cause drowsiness.   - cyclobenzaprine (FLEXERIL) 5 MG tablet; Take 1-2 tablets (5-10 mg total) by mouth 3 (three) times daily as needed for muscle spasms.  Dispense: 30 tablet; Refill: 1  2. Hypokalemia Mild, but considering she is taking lithium will start potassium supplement.  - potassium chloride (K-DUR) 10 MEQ tablet; Take 1 tablet (10 mEq total) by mouth daily.  Dispense: 30  tablet; Refill: 1  She anticipates that Dr. Bridgett Larsson will be checking labs again next month and will send copy of so. If he does not check potassium she is to contact our office for lab order in about a month.    3. Bacteriuria Asymptomatic.  - Urine culture  4. Need for influenza vaccination  - Flu Vaccine QUAD 36+ mos IM        Lelon Huh, MD  [facility]

## 2014-12-24 ENCOUNTER — Encounter: Payer: Self-pay | Admitting: Family Medicine

## 2014-12-24 LAB — URINE CULTURE

## 2014-12-27 ENCOUNTER — Telehealth: Payer: Self-pay

## 2014-12-27 NOTE — Telephone Encounter (Signed)
Patient advised as directed below.  Thanks,  -Joseline 

## 2014-12-27 NOTE — Telephone Encounter (Signed)
-----   Message from [name], MD sent at 12/25/2014  9:27 AM EDT ----- Urine culture does not show infection. No antibiotics are needed so long as she is not having pain with urination.

## 2015-01-05 DIAGNOSIS — K2980 Duodenitis without bleeding: Secondary | ICD-10-CM | POA: Insufficient documentation

## 2015-01-05 DIAGNOSIS — L300 Nummular dermatitis: Secondary | ICD-10-CM | POA: Insufficient documentation

## 2015-02-22 ENCOUNTER — Ambulatory Visit: Payer: 59 | Admitting: Licensed Clinical Social Worker

## 2015-02-28 ENCOUNTER — Ambulatory Visit (INDEPENDENT_AMBULATORY_CARE_PROVIDER_SITE_OTHER): Payer: 59 | Admitting: Licensed Clinical Social Worker

## 2015-02-28 DIAGNOSIS — F39 Unspecified mood [affective] disorder: Secondary | ICD-10-CM | POA: Diagnosis not present
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
## 2015-03-01 ENCOUNTER — Encounter: Payer: Self-pay | Admitting: Psychiatry

## 2015-03-01 ENCOUNTER — Ambulatory Visit (INDEPENDENT_AMBULATORY_CARE_PROVIDER_SITE_OTHER): Payer: 59 | Admitting: Psychiatry

## 2015-03-01 VITALS — BP 122/86 | HR 103 | Temp 98.3°F | Ht 63.0 in | Wt 146.8 lb

## 2015-03-01 DIAGNOSIS — F3160 Bipolar disorder, current episode mixed, unspecified: Secondary | ICD-10-CM | POA: Diagnosis not present

## 2015-03-01 DIAGNOSIS — F4310 Post-traumatic stress disorder, unspecified: Secondary | ICD-10-CM | POA: Diagnosis not present

## 2015-03-01 MED ORDER — QUETIAPINE FUMARATE 25 MG PO TABS: 100.0000 mg | ORAL_TABLET | Freq: Two times a day (BID) | ORAL | Status: DC

## 2015-03-01 MED ORDER — VENLAFAXINE HCL ER 75 MG PO CP24: 150.0000 mg | ORAL_CAPSULE | Freq: Every day | ORAL | Status: DC

## 2015-03-01 MED ORDER — QUETIAPINE FUMARATE 50 MG PO TABS: 150.0000 mg | ORAL_TABLET | Freq: Every day | ORAL | Status: DC
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
## 2015-03-11 ENCOUNTER — Telehealth: Payer: Self-pay | Admitting: Psychiatry

## 2015-03-14 NOTE — Telephone Encounter (Signed)
spoke with patient. pt states she has appt for tuesday and that she would speak with doctor at her appt.

## 2015-03-15 ENCOUNTER — Encounter: Payer: Self-pay | Admitting: Psychiatry

## 2015-03-15 ENCOUNTER — Ambulatory Visit (INDEPENDENT_AMBULATORY_CARE_PROVIDER_SITE_OTHER): Payer: 59 | Admitting: Psychiatry

## 2015-03-15 ENCOUNTER — Telehealth: Payer: Self-pay

## 2015-03-15 VITALS — BP 138/78 | HR 106 | Temp 97.8°F | Ht 63.0 in | Wt 149.2 lb

## 2015-03-15 DIAGNOSIS — F4312 Post-traumatic stress disorder, chronic: Secondary | ICD-10-CM | POA: Diagnosis not present

## 2015-03-15 MED ORDER — VENLAFAXINE HCL ER 75 MG PO CP24: 75.0000 mg | ORAL_CAPSULE | Freq: Every day | ORAL | Status: DC

## 2015-03-15 MED ORDER — QUETIAPINE FUMARATE 25 MG PO TABS: 25.0000 mg | ORAL_TABLET | ORAL | Status: DC

## 2015-03-15 MED ORDER — VENLAFAXINE HCL ER 75 MG PO CP24: 150.0000 mg | ORAL_CAPSULE | Freq: Every day | ORAL | Status: DC

## 2015-03-15 NOTE — Telephone Encounter (Signed)
Called Pharmacy as pt was unable to get the right amount of the dispense medications. She will get a prescription of Seroquel 25 mg by mouth twice a day Seroquel 300 mg half pill at bedtime
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
## 2015-03-15 NOTE — Telephone Encounter (Signed)
rx for seroquel will not go threw.  insurance will only pay for max # of 3 pills a day. will need to resend a new order

## 2015-03-30 NOTE — Progress Notes (Signed)
Pharmacy notified.

## 2015-04-07 ENCOUNTER — Ambulatory Visit (INDEPENDENT_AMBULATORY_CARE_PROVIDER_SITE_OTHER): Payer: 59 | Admitting: Family Medicine

## 2015-04-07 ENCOUNTER — Encounter: Payer: Self-pay | Admitting: Family Medicine

## 2015-04-07 VITALS — BP 100/56 | HR 105 | Temp 98.5°F | Resp 16 | Wt 149.2 lb

## 2015-04-07 DIAGNOSIS — J4521 Mild intermittent asthma with (acute) exacerbation: Secondary | ICD-10-CM

## 2015-04-07 MED ORDER — DOXYCYCLINE HYCLATE 100 MG PO TABS: 100.0000 mg | ORAL_TABLET | Freq: Two times a day (BID) | ORAL | Status: DC

## 2015-04-07 MED ORDER — HYDROCODONE-HOMATROPINE 5-1.5 MG/5ML PO SYRP: ORAL_SOLUTION | ORAL | Status: DC

## 2015-04-07 MED ORDER — PREDNISONE 20 MG PO TABS
ORAL_TABLET | ORAL | Status: DC
Start: 2015-04-07 — End: 2015-06-23

## 2015-04-07 NOTE — Progress Notes (Signed)
Subjective:     Patient ID: Katelyn Cooper, female   DOB: 05/03/1955, 60 y.o.   MRN: [id number]  HPI  Chief Complaint  Patient presents with  . Cough    Patient comes in office today with concerns of cough and chest congestion for the past week. Patient states that cough is non productive and her chest feels heavy, she states that she has had trouble breathing and has had runny nose. Patient has tried taking otc Advil for relief.   Reports sinus allergy sx a 3-4 weeks before onset of cough. States she has decreased smoking while ill. + flu shot.  Review of Systems  Constitutional: Positive for chills. Negative for fever.       Objective:   Physical Exam  Constitutional: She appears well-developed and well-nourished. No distress.  Ears: T.M's intact without inflammation Sinuses: non-tender Throat: no tonsillar enlargement or exudate Neck: no cervical adenopathy Lungs: Bilateral posterior expiratory wheezes      Assessment:    1. Asthmatic bronchitis, mild intermittent, with acute exacerbation - doxycycline (VIBRA-TABS) 100 MG tablet; Take 1 tablet (100 mg total) by mouth 2 (two) times daily.  Dispense: 20 tablet; Refill: 0 - HYDROcodone-homatropine (HYCODAN) 5-1.5 MG/5ML syrup; 5 ml 4-6 hours as needed for cough  Dispense: 240 mL; Refill: 0 - predniSONE (DELTASONE) 20 MG tablet; Taper as follows: 3 pills for 4 days, two pills for 4 days, one pill for four days  Dispense: 24 tablet; Refill: 0    Plan:    Add expectorant and stop smoking while ill.

## 2015-04-07 NOTE — Patient Instructions (Addendum)
Add Mucinex and stop smoking while ill. May add Claritin for sinus congestion.

## 2015-04-14 ENCOUNTER — Ambulatory Visit: Payer: 59 | Admitting: Psychiatry

## 2015-04-15 ENCOUNTER — Other Ambulatory Visit: Payer: Self-pay

## 2015-04-15 NOTE — Telephone Encounter (Signed)
pt called states that she will run out of her seroquel 300mg  take half a tablet at night total 150mg  .  pt states that insurance would not pay for her to take the 25mg  and take 6 at night plus the 1 in the am.  so dr. Gretel Acre called in seroquel 25mg  take one in the am and then a 300mg  seroquel take half a tablet at bedtime.   Pt will not have enough of the 300 mg to make it to her appt on  04-21-15.  Pt last seen on  03-15-15

## 2015-04-19 MED ORDER — QUETIAPINE FUMARATE 300 MG PO TABS: ORAL_TABLET | ORAL | Status: DC

## 2015-04-21 ENCOUNTER — Encounter: Payer: Self-pay | Admitting: Psychiatry

## 2015-04-21 ENCOUNTER — Ambulatory Visit (INDEPENDENT_AMBULATORY_CARE_PROVIDER_SITE_OTHER): Payer: 59 | Admitting: Psychiatry

## 2015-04-21 VITALS — BP 140/78 | HR 104 | Temp 98.8°F | Ht 63.0 in | Wt 147.4 lb

## 2015-04-21 DIAGNOSIS — F4312 Post-traumatic stress disorder, chronic: Secondary | ICD-10-CM

## 2015-04-21 MED ORDER — QUETIAPINE FUMARATE 25 MG PO TABS: 25.0000 mg | ORAL_TABLET | Freq: Two times a day (BID) | ORAL | Status: DC

## 2015-04-21 MED ORDER — QUETIAPINE FUMARATE 100 MG PO TABS: 150.0000 mg | ORAL_TABLET | Freq: Every day | ORAL | Status: DC

## 2015-04-21 MED ORDER — VENLAFAXINE HCL ER 75 MG PO CP24: 75.0000 mg | ORAL_CAPSULE | Freq: Every day | ORAL | Status: DC
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
## 2015-06-23 ENCOUNTER — Ambulatory Visit
Admission: RE | Admit: 2015-06-23 | Discharge: 2015-06-23 | Disposition: A | Discharge status: Home / Self Care | Payer: 59 | Source: Ambulatory Visit | Attending: Family Medicine | Admitting: Family Medicine

## 2015-06-23 ENCOUNTER — Ambulatory Visit (INDEPENDENT_AMBULATORY_CARE_PROVIDER_SITE_OTHER): Payer: 59 | Admitting: Family Medicine

## 2015-06-23 ENCOUNTER — Telehealth: Payer: Self-pay | Admitting: *Deleted

## 2015-06-23 ENCOUNTER — Encounter: Payer: Self-pay | Admitting: Family Medicine

## 2015-06-23 VITALS — BP 100/58 | HR 70 | Temp 98.0°F | Resp 16 | Wt 154.0 lb

## 2015-06-23 DIAGNOSIS — M546 Pain in thoracic spine: Secondary | ICD-10-CM | POA: Diagnosis not present

## 2015-06-23 DIAGNOSIS — R300 Dysuria: Secondary | ICD-10-CM

## 2015-06-23 DIAGNOSIS — R918 Other nonspecific abnormal finding of lung field: Secondary | ICD-10-CM | POA: Insufficient documentation

## 2015-06-23 DIAGNOSIS — R05 Cough: Secondary | ICD-10-CM | POA: Insufficient documentation

## 2015-06-23 DIAGNOSIS — R059 Cough, unspecified: Secondary | ICD-10-CM

## 2015-06-23 LAB — POCT URINALYSIS DIPSTICK
Bilirubin, UA: NEGATIVE
Glucose, UA: NEGATIVE
KETONES UA: NEGATIVE
LEUKOCYTES UA: NEGATIVE
Nitrite, UA: NEGATIVE
PH UA: 6
PROTEIN UA: NEGATIVE
RBC UA: NEGATIVE
SPEC GRAV UA: 1.01
Urobilinogen, UA: 0.2

## 2015-06-23 MED ORDER — AZITHROMYCIN 250 MG PO TABS: ORAL_TABLET | ORAL | Status: DC

## 2015-06-23 NOTE — Progress Notes (Signed)
Patient: Katelyn Cooper Female    DOB: 1955-06-22   60 y.o.   MRN: [id number] Visit Date: 06/23/2015  Today's Provider: Lelon Huh, MD   Chief Complaint  Patient presents with  . Follow-up  . Back Pain   Subjective:    Back Pain This is a recurrent problem. Episode onset: 2 days. The problem occurs constantly. The problem is unchanged. The quality of the pain is described as cramping. The pain does not radiate. The pain is at a severity of 8/10. The pain is moderate. The pain is worse during the day. Associated symptoms include dysuria. Pertinent negatives include no abdominal pain, bladder incontinence, bowel incontinence, chest pain, fever, headaches, leg pain, numbness, paresis, paresthesias, pelvic pain, perianal numbness, tingling, weakness or weight loss. Treatments tried: flexeril and advil. The treatment provided mild relief.    Was seen for left low back pain 12/23/2014 and prescribed flexeril 5 mg for muscle spasms. That pain had resolved. Current pain is on left but higher around T6-T8 level. Has been taking up to 600mg  ibuprofen three times a day with minimal relief, and no relief from cyclobenzaprine. Has not had any known injuries. She has had cold symptoms and mild cough for the last couple of weeks. No fever.   Allergies  Allergen Reactions  . Lovastatin     depression  . Paroxetine Hcl     itching  . Lamotrigine Rash   Previous Medications   DOXYCYCLINE (VIBRA-TABS) 100 MG TABLET    Take 1 tablet (100 mg total) by mouth 2 (two) times daily.   GLUCOSE BLOOD TEST STRIP    1 each by Other route as needed for other (ONE TOUCH ULTRA TEST STRIPS AND LANCETS). Use as instructed   HYDROCODONE-HOMATROPINE (HYCODAN) 5-1.5 MG/5ML SYRUP    5 ml 4-6 hours as needed for cough   IBUPROFEN (ADVIL) 200 MG TABLET    Take 200 mg by mouth as needed.   PRAVASTATIN (PRAVACHOL) 40 MG TABLET    Take 1 tablet (40 mg total) by mouth daily.   PREDNISONE (DELTASONE) 20 MG TABLET    Taper  as follows: 3 pills for 4 days, two pills for 4 days, one pill for four days   QUETIAPINE (SEROQUEL) 100 MG TABLET    Take 1.5 tablets (150 mg total) by mouth at bedtime.   QUETIAPINE (SEROQUEL) 25 MG TABLET    Take 1 tablet (25 mg total) by mouth 2 (two) times daily.   VENLAFAXINE XR (EFFEXOR-XR) 75 MG 24 HR CAPSULE    Take 1 capsule (75 mg total) by mouth daily with breakfast. Pt has supply   VITAMIN D, ERGOCALCIFEROL, (DRISDOL) 50000 UNITS CAPS CAPSULE    take 1 capsule by mouth TWICE A WEEK    Review of Systems  Constitutional: Negative for fever, chills, weight loss, appetite change and fatigue.  Respiratory: Negative for chest tightness and shortness of breath.   Cardiovascular: Negative for chest pain and palpitations.  Gastrointestinal: Negative for nausea, vomiting, abdominal pain and bowel incontinence.  Genitourinary: Positive for dysuria, urgency, frequency and flank pain. Negative for bladder incontinence and pelvic pain.  Musculoskeletal: Positive for back pain.  Neurological: Negative for dizziness, tingling, weakness, numbness, headaches and paresthesias.    Social History  Substance Use Topics  . Smoking status: Current Every Day Smoker -- 1.00 packs/day    Types: Cigarettes    Start date: 03/01/1975  . Smokeless tobacco: Never Used     Comment: pt had  quit for 10 years but started back  . Alcohol Use: No   Objective:   BP 100/58 mmHg  Pulse 70  Temp(Src) 98 F (36.7 C) (Oral)  Resp 16  Wt 154 lb (69.854 kg)  SpO2 98%  Physical Exam   General Appearance:    Alert, cooperative, no distress  Lungs:     Clear to auscultation bilaterally, respirations unlabored  Heart:    Regular rate and rhythm  Neurologic:   Awake, alert, oriented x 3. No apparent focal neurological           defect.   MS:    Slight tenderness with deep palpation left of mid thoracic spine. No gross deformities. No swelling.      Results for orders placed or performed in visit on 06/23/15    POCT urinalysis dipstick  Result Value Ref Range   Color, UA yellow    Clarity, UA Clear    Glucose, UA Neg    Bilirubin, UA Neg    Ketones, UA Neg    Spec Grav, UA 1.010    Blood, UA Neg    pH, UA 6.0    Protein, UA Neg    Urobilinogen, UA 0.2    Nitrite, UA Neg    Leukocytes, UA Negative Negative       Assessment & Plan:     1. Dysuria Normal u/a  - POCT urinalysis dipstick  2. Left-sided thoracic back pain Suspect intercostal muscle strain, possible due to uri and cough over the last couple of weeks. Will obtain Xray as below. Consider topical pain relievers if Xrays are negative.  - DG Chest 2 View; Future - DG Thoracic Spine 2 View; Future  3. Cough Improving,         Lelon Huh, MD  [facility]

## 2015-06-23 NOTE — Telephone Encounter (Signed)
Patient was notified of results. Patient expressed understanding. Rx sent to pharmacy.  

## 2015-06-23 NOTE — Telephone Encounter (Signed)
-----   Message from [name], MD sent at 06/23/2015  3:24 PM EDT ----- Katelyn Cooper shows are of suspected bronchitis on the left side. This could be causing some soreness and inflammation in the muscles around her ribs and lungs. Recommend she start a z-pack. Recommend OTC 4%lidocaine patch for the back pain until pain has resolved.

## 2015-06-28 ENCOUNTER — Telehealth: Payer: Self-pay

## 2015-06-28 NOTE — Telephone Encounter (Signed)
Please call patient to see if she still has pain or cough. If so need tosend 5 days azithromycin to her local pharmacy. Also, she should stop Seroquel while taking azithromycin.   Please cancel rx that was sent to [facility]. Thanks.

## 2015-06-28 NOTE — Telephone Encounter (Signed)
LMOVM for pt to return call 

## 2015-06-28 NOTE — Telephone Encounter (Signed)
Katelyn Cooper with [facility] states they got a prescription for an antibiotic from 06/23/2015. Katelyn Cooper is questioning if this should be filled by mail order vs. Local pharmacy. Katelyn Cooper also states there is an interaction with the Z Pack and pt's Seroquel have an interaction involving the cardiac system. Is this okay to fill? And should [facility] be the pharmacy to fill it? Reference# [id number]. CB# [phone]. Renaldo Fiddler, CMA

## 2015-06-29 NOTE — Telephone Encounter (Signed)
Patient notified. Patient stated that she is feeling somewhat better and wants to hold off on getting the rx for zpack. Patient stated that the pain patches for her back have helped a lot. Patient said that she will cb or come in for ov if symptoms worsen. Patient said that she does not want to stop taking the Seroquel.

## 2015-07-19 ENCOUNTER — Ambulatory Visit (INDEPENDENT_AMBULATORY_CARE_PROVIDER_SITE_OTHER): Payer: 59 | Admitting: Psychiatry

## 2015-07-19 ENCOUNTER — Encounter: Payer: Self-pay | Admitting: Psychiatry

## 2015-07-19 VITALS — BP 122/78 | HR 96 | Temp 98.1°F | Ht 63.0 in | Wt 144.0 lb

## 2015-07-19 DIAGNOSIS — F3160 Bipolar disorder, current episode mixed, unspecified: Secondary | ICD-10-CM | POA: Diagnosis not present

## 2015-07-19 DIAGNOSIS — F4312 Post-traumatic stress disorder, chronic: Secondary | ICD-10-CM | POA: Diagnosis not present

## 2015-07-19 MED ORDER — QUETIAPINE FUMARATE 100 MG PO TABS: 150.0000 mg | ORAL_TABLET | Freq: Every day | ORAL | Status: DC

## 2015-07-19 MED ORDER — VENLAFAXINE HCL ER 75 MG PO CP24: 75.0000 mg | ORAL_CAPSULE | Freq: Every day | ORAL | Status: DC

## 2015-07-19 MED ORDER — QUETIAPINE FUMARATE 25 MG PO TABS: 25.0000 mg | ORAL_TABLET | Freq: Two times a day (BID) | ORAL | Status: DC

## 2015-07-19 MED ORDER — LANSOPRAZOLE 30 MG PO CPDR: 30.0000 mg | DELAYED_RELEASE_CAPSULE | Freq: Every day | ORAL | Status: DC
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
## 2015-07-28 ENCOUNTER — Ambulatory Visit (INDEPENDENT_AMBULATORY_CARE_PROVIDER_SITE_OTHER): Payer: 59 | Admitting: Internal Medicine

## 2015-07-28 ENCOUNTER — Encounter: Payer: Self-pay | Admitting: Internal Medicine

## 2015-07-28 ENCOUNTER — Encounter (INDEPENDENT_AMBULATORY_CARE_PROVIDER_SITE_OTHER): Payer: Self-pay

## 2015-07-28 VITALS — BP 128/78 | HR 107 | Ht 63.0 in | Wt 145.0 lb

## 2015-07-28 DIAGNOSIS — J449 Chronic obstructive pulmonary disease, unspecified: Secondary | ICD-10-CM | POA: Diagnosis not present

## 2015-07-28 DIAGNOSIS — R0781 Pleurodynia: Secondary | ICD-10-CM | POA: Diagnosis not present

## 2015-07-28 MED ORDER — GLYCOPYRROLATE-FORMOTEROL 9-4.8 MCG/ACT IN AERO: 2.0000 | INHALATION_SPRAY | Freq: Two times a day (BID) | RESPIRATORY_TRACT | Status: AC

## 2015-07-28 MED ORDER — ALBUTEROL SULFATE HFA 108 (90 BASE) MCG/ACT IN AERS: 2.0000 | INHALATION_SPRAY | Freq: Four times a day (QID) | RESPIRATORY_TRACT | Status: DC | PRN

## 2015-07-28 MED ORDER — GLYCOPYRROLATE-FORMOTEROL 9-4.8 MCG/ACT IN AERO: 2.0000 | INHALATION_SPRAY | Freq: Two times a day (BID) | RESPIRATORY_TRACT | Status: DC

## 2015-07-28 NOTE — Progress Notes (Signed)
[facility] Consultation     Date: 07/28/2015,   MRN# [id number] Katelyn Cooper January 19, 1956 Code Status:  Code Status History    This patient does not have a recorded code status. Please follow your organizational policy for patients in this situation.     Hosp day:@LENGTHOFSTAYDAYS @ Referring MD: @ATDPROV @     PCP:      AdmissionWeight: 145 lb (65.772 kg)                 CurrentWeight: 145 lb (65.772 kg) Katelyn Cooper is a 60 y.o. old female seen in consultation for cough, pleuritic chest pain. Previous BQ patient     CHIEF COMPLAINT:   Pleuritic chest pain   HISTORY OF PRESENT ILLNESS   [birth year]  60 yo white female seen today, previous BQ patient H/o bronchitis and rib chest wall pain Now with similar complaints of cough, wheezing and back pain, pleuritic in nature x 4 weeks Still smokes  No signs if infection at this time 1 PPD for 40 years works on Set designer at [employer] Has chronic SOB/DOE    Current Medication:   Current outpatient prescriptions:  .  cholecalciferol (VITAMIN D) 1000 units tablet, Take 1,000 Units by mouth daily., Disp: , Rfl:  .  cyclobenzaprine (FLEXERIL) 5 MG tablet, Take 5 mg by mouth 3 (three) times daily as needed for muscle spasms., Disp: , Rfl:  .  desoximetasone (TOPICORT) 0.25 % cream, APPLY TOPICALLY IF NEEDED FOR 14 DAYS, Disp: , Rfl: 0 .  glucose blood test strip, 1 each by Other route as needed for other (ONE TOUCH ULTRA TEST STRIPS AND LANCETS). Use as instructed, Disp: , Rfl:  .  ibuprofen (ADVIL) 200 MG tablet, Take 200 mg by mouth as needed., Disp: , Rfl:  .  lansoprazole (PREVACID) 30 MG capsule, Take 1 capsule (30 mg total) by mouth daily at 12 noon., Disp: 30 capsule, Rfl: 1 .  nystatin cream (MYCOSTATIN), APPLY TOPICALLY IF NEEDED FOR 14 DAYS, Disp: , Rfl: 0 .  pravastatin (PRAVACHOL) 40 MG tablet, Take 1 tablet (40 mg total) by mouth daily., Disp: 90 tablet, Rfl: 3 .  QUEtiapine (SEROQUEL) 100 MG tablet, Take 1.5  tablets (150 mg total) by mouth at bedtime., Disp: 45 tablet, Rfl: 2 .  QUEtiapine (SEROQUEL) 25 MG tablet, Take 1 tablet (25 mg total) by mouth 2 (two) times daily., Disp: 180 tablet, Rfl: 1 .  venlafaxine XR (EFFEXOR-XR) 75 MG 24 hr capsule, Take 1 capsule (75 mg total) by mouth daily with breakfast. Pt has supply, Disp: 90 capsule, Rfl: 1     ALLERGIES   Lovastatin; Paroxetine hcl; and Lamotrigine     REVIEW OF SYSTEMS   Review of Systems  Constitutional: Negative for fever, chills, weight loss and malaise/fatigue.  Eyes: Negative for blurred vision.  Respiratory: Positive for cough, shortness of breath and wheezing. Negative for hemoptysis and sputum production.   Cardiovascular: Negative for chest pain, palpitations, orthopnea and leg swelling.  Gastrointestinal: Negative for heartburn, nausea and vomiting.  Musculoskeletal: Positive for back pain.  Skin: Negative for rash.  Neurological: Negative for dizziness and headaches.  Psychiatric/Behavioral: Negative for depression. The patient is not nervous/anxious.   All other systems reviewed and are negative.    VS: BP 128/78 mmHg  Pulse 107  Ht 5\' 3"  (1.6 m)  Wt 145 lb (65.772 kg)  BMI 25.69 kg/m2  SpO2 97%     PHYSICAL EXAM   Physical Exam  Constitutional: She is oriented  to person, place, and time. She appears well-developed and well-nourished. No distress.  HENT:  Head: Normocephalic and atraumatic.  Mouth/Throat: No oropharyngeal exudate.  Eyes: EOM are normal. Pupils are equal, round, and reactive to light. No scleral icterus.  Neck: Normal range of motion. Neck supple.  Cardiovascular: Normal rate, regular rhythm and normal heart sounds.   No murmur heard. Pulmonary/Chest: No stridor. No respiratory distress. She has no wheezes.  Musculoskeletal: Normal range of motion. She exhibits no edema.  Neurological: She is alert and oriented to person, place, and time. No cranial nerve deficit.  Skin: Skin is  warm. She is not diaphoretic.  Psychiatric: She has a normal mood and affect.        ASSESSMENT/PLAN   60 yo white female with ongoing tobacco use with cough and wheezing, signs and symptoms of COPD with pleuritic chest pain  1.obtain CT chest without contrast 2.start AC/LABA wth Bevespi, continue albuterol as needed 3.she will need PFT and 6MWT 4.smoking cessation strongly advised  Follow up 1 month for interval assessment  The Patient requires high complexity decision making for assessment and support, frequent evaluation and titration of therapies, application of advanced monitoring technologies and extensive interpretation of multiple databases.  Patient are satisfied with Plan of action and management. All questions answered   Katelyn Cooper, M.D.  [facility]  Medical Director [place] Director [facility] Cardio-Pulmonary Department

## 2015-07-28 NOTE — Patient Instructions (Signed)
Chronic Obstructive Pulmonary Disease Chronic obstructive pulmonary disease (COPD) is a common lung condition in which airflow from the lungs is limited. COPD is a general term that can be used to describe many different lung problems that limit airflow, including both chronic bronchitis and emphysema. If you have COPD, your lung function will probably never return to normal, but there are measures you can take to improve lung function and make yourself feel better. CAUSES   Smoking (common).  Exposure to secondhand smoke.  Genetic problems.  Chronic inflammatory lung diseases or recurrent infections. SYMPTOMS  Shortness of breath, especially with physical activity.  Deep, persistent (chronic) cough with a large amount of thick mucus.  Wheezing.  Rapid breaths (tachypnea).  Gray or bluish discoloration (cyanosis) of the skin, especially in your fingers, toes, or lips.  Fatigue.  Weight loss.  Frequent infections or episodes when breathing symptoms become much worse (exacerbations).  Chest tightness. DIAGNOSIS Your health care provider will take a medical history and perform a physical examination to diagnose COPD. Additional tests for COPD may include:  Lung (pulmonary) function tests.  Chest X-ray.  CT scan.  Blood tests. TREATMENT  Treatment for COPD may include:  Inhaler and nebulizer medicines. These help manage the symptoms of COPD and make your breathing more comfortable.  Supplemental oxygen. Supplemental oxygen is only helpful if you have a low oxygen level in your blood.  Exercise and physical activity. These are beneficial for nearly all people with COPD.  Lung surgery or transplant.  Nutrition therapy to gain weight, if you are underweight.  Pulmonary rehabilitation. This may involve working with a team of health care providers and specialists, such as respiratory, occupational, and physical therapists. HOME CARE INSTRUCTIONS  Take all medicines  (inhaled or pills) as directed by your health care provider.  Avoid over-the-counter medicines or cough syrups that dry up your airway (such as antihistamines) and slow down the elimination of secretions unless instructed otherwise by your health care provider.  If you are a smoker, the most important thing that you can do is stop smoking. Continuing to smoke will cause further lung damage and breathing trouble. Ask your health care provider for help with quitting smoking. He or she can direct you to community resources or hospitals that provide support.  Avoid exposure to irritants such as smoke, chemicals, and fumes that aggravate your breathing.  Use oxygen therapy and pulmonary rehabilitation if directed by your health care provider. If you require home oxygen therapy, ask your health care provider whether you should purchase a pulse oximeter to measure your oxygen level at home.  Avoid contact with individuals who have a contagious illness.  Avoid extreme temperature and humidity changes.  Eat healthy foods. Eating smaller, more frequent meals and resting before meals may help you maintain your strength.  Stay active, but balance activity with periods of rest. Exercise and physical activity will help you maintain your ability to do things you want to do.  Preventing infection and hospitalization is very important when you have COPD. Make sure to receive all the vaccines your health care provider recommends, especially the pneumococcal and influenza vaccines. Ask your health care provider whether you need a pneumonia vaccine.  Learn and use relaxation techniques to manage stress.  Learn and use controlled breathing techniques as directed by your health care provider. Controlled breathing techniques include:  Pursed lip breathing. Start by breathing in (inhaling) through your nose for 1 second. Then, purse your lips as if you were   going to whistle and breathe out (exhale) through the  pursed lips for 2 seconds.  Diaphragmatic breathing. Start by putting one hand on your abdomen just above your waist. Inhale slowly through your nose. The hand on your abdomen should move out. Then purse your lips and exhale slowly. You should be able to feel the hand on your abdomen moving in as you exhale.  Learn and use controlled coughing to clear mucus from your lungs. Controlled coughing is a series of short, progressive coughs. The steps of controlled coughing are: 1. Lean your head slightly forward. 2. Breathe in deeply using diaphragmatic breathing. 3. Try to hold your breath for 3 seconds. 4. Keep your mouth slightly open while coughing twice. 5. Spit any mucus out into a tissue. 6. Rest and repeat the steps once or twice as needed. SEEK MEDICAL CARE IF:  You are coughing up more mucus than usual.  There is a change in the color or thickness of your mucus.  Your breathing is more labored than usual.  Your breathing is faster than usual. SEEK IMMEDIATE MEDICAL CARE IF:  You have shortness of breath while you are resting.  You have shortness of breath that prevents you from:  Being able to talk.  Performing your usual physical activities.  You have chest pain lasting longer than 5 minutes.  Your skin color is more cyanotic than usual.  You measure low oxygen saturations for longer than 5 minutes with a pulse oximeter. MAKE SURE YOU:  Understand these instructions.  Will watch your condition.  Will get help right away if you are not doing well or get worse.   This information is not intended to replace advice given to you by your health care provider. Make sure you discuss any questions you have with your health care provider.   Document Released: 12/27/2004 Document Revised: 04/09/2014 Document Reviewed: 11/13/2012 Elsevier Interactive Patient Education 2016 [employer]  

## 2015-08-10 ENCOUNTER — Other Ambulatory Visit: Payer: Self-pay | Admitting: Internal Medicine

## 2015-08-11 LAB — BASIC METABOLIC PANEL
BUN/Creatinine Ratio: 10 — ABNORMAL LOW (ref 12–28)
BUN: 9 mg/dL (ref 8–27)
CALCIUM: 9.3 mg/dL (ref 8.7–10.3)
CO2: 20 mmol/L (ref 18–29)
CREATININE: 0.9 mg/dL (ref 0.57–1.00)
Chloride: 99 mmol/L (ref 96–106)
GFR calc Af Amer: 80 mL/min/{1.73_m2} (ref >59)
GFR, EST NON AFRICAN AMERICAN: 69 mL/min/{1.73_m2} (ref >59)
GLUCOSE: 152 mg/dL — AB (ref 65–99)
Potassium: 3.7 mmol/L (ref 3.5–5.2)
Sodium: 139 mmol/L (ref 134–144)

## 2015-08-19 ENCOUNTER — Ambulatory Visit (INDEPENDENT_AMBULATORY_CARE_PROVIDER_SITE_OTHER): Payer: 59 | Admitting: *Deleted

## 2015-08-19 DIAGNOSIS — R0781 Pleurodynia: Secondary | ICD-10-CM | POA: Diagnosis not present

## 2015-08-19 DIAGNOSIS — J449 Chronic obstructive pulmonary disease, unspecified: Secondary | ICD-10-CM

## 2015-08-19 LAB — PULMONARY FUNCTION TEST
DL/VA % pred: 68 %
DL/VA: 3.21 ml/min/mmHg/L
DLCO UNC % PRED: 61 %
DLCO unc: 14.08 ml/min/mmHg
FEF 25-75 PRE: 2.53 L/s
FEF 25-75 Post: 1.8 L/sec
FEF2575-%CHANGE-POST: -28 %
FEF2575-%PRED-POST: 80 %
FEF2575-%Pred-Pre: 112 %
FEV1-%CHANGE-POST: 0 %
FEV1-%Pred-Post: 86 %
FEV1-%Pred-Pre: 87 %
FEV1-POST: 2.11 L
FEV1-Pre: 2.12 L
FEV1FVC-%CHANGE-POST: 0 %
FEV1FVC-%Pred-Pre: 103 %
FEV6-%Change-Post: 3 %
FEV6-%Pred-Post: 86 %
FEV6-%Pred-Pre: 82 %
FEV6-PRE: 2.51 L
FEV6-Post: 2.61 L
FEV6FVC-%Change-Post: 0 %
FEV6FVC-%PRED-PRE: 104 %
FEV6FVC-%Pred-Post: 103 %
FVC-%CHANGE-POST: 0 %
FVC-%PRED-POST: 83 %
FVC-%Pred-Pre: 83 %
FVC-PRE: 2.63 L
FVC-Post: 2.63 L
POST FEV1/FVC RATIO: 80 %
PRE FEV6/FVC RATIO: 100 %
Post FEV6/FVC ratio: 99 %
Pre FEV1/FVC ratio: 80 %
RV % PRED: 109 %
RV: 2.14 L
TLC % pred: 102 %
TLC: 5.02 L

## 2015-08-19 NOTE — Progress Notes (Signed)
SMW performed today. 

## 2015-08-19 NOTE — Progress Notes (Signed)
PFT performed today. 

## 2015-08-30 ENCOUNTER — Ambulatory Visit
Admission: RE | Admit: 2015-08-30 | Discharge: 2015-08-30 | Disposition: A | Discharge status: Home / Self Care | Payer: 59 | Source: Ambulatory Visit | Attending: Internal Medicine | Admitting: Internal Medicine

## 2015-08-30 DIAGNOSIS — R0781 Pleurodynia: Secondary | ICD-10-CM

## 2015-08-30 DIAGNOSIS — R918 Other nonspecific abnormal finding of lung field: Secondary | ICD-10-CM | POA: Diagnosis not present

## 2015-08-30 DIAGNOSIS — F17200 Nicotine dependence, unspecified, uncomplicated: Secondary | ICD-10-CM | POA: Insufficient documentation

## 2015-09-01 ENCOUNTER — Encounter (INDEPENDENT_AMBULATORY_CARE_PROVIDER_SITE_OTHER): Payer: Self-pay

## 2015-09-01 ENCOUNTER — Ambulatory Visit (INDEPENDENT_AMBULATORY_CARE_PROVIDER_SITE_OTHER): Payer: 59 | Admitting: Internal Medicine

## 2015-09-01 ENCOUNTER — Encounter: Payer: Self-pay | Admitting: Internal Medicine

## 2015-09-01 VITALS — BP 126/72 | HR 104 | Ht 63.0 in | Wt 145.0 lb

## 2015-09-01 DIAGNOSIS — R911 Solitary pulmonary nodule: Secondary | ICD-10-CM | POA: Diagnosis not present

## 2015-09-01 MED ORDER — NICOTINE 21 MG/24HR TD PT24: 21.0000 mg | MEDICATED_PATCH | Freq: Every day | TRANSDERMAL | Status: DC

## 2015-09-01 NOTE — Progress Notes (Signed)
[facility] Consultation     Date: 09/01/2015,   MRN# [id number] Katelyn Cooper 1955-09-10 Code Status:  Code Status History    This patient does not have a recorded code status. Please follow your organizational policy for patients in this situation.     Hosp day:@LENGTHOFSTAYDAYS @ Referring MD: @ATDPROV @     PCP:      AdmissionWeight: 145 lb (65.772 kg)                 CurrentWeight: 145 lb (65.772 kg) Katelyn Cooper is a 60 y.o. old female seen in consultation for cough, pleuritic chest pain. Previous BQ patient     CHIEF COMPLAINT:   Pleuritic chest pain-resolved Has intermittent wheezing, sill smokes   HISTORY OF PRESENT ILLNESS   65  60 yo white female seen today, previous BQ patient H/o bronchitis and rib chest wall pain No acute SOB, WOB. No cough, has intermittent wheezing Still smokes No signs if infection at this time 1 PPD for 40 years works on Teaching laboratory technician at [employer] Has chronic SOB/DOE  PFT's 08/2015 reviewed with patient ratio 80% Fev1 2.2 L 87% FVC 2.6L 83%  Ct chest 08/30/15 Left ug Nodule approx 5 MM increased from 2-3 MM from 2013.   Current Medication:   Current outpatient prescriptions:  .  albuterol (PROVENTIL HFA;VENTOLIN HFA) 108 (90 Base) MCG/ACT inhaler, Inhale 2 puffs into the lungs every 6 (six) hours as needed for wheezing or shortness of breath., Disp: 1 Inhaler, Rfl: 2 .  cholecalciferol (VITAMIN D) 1000 units tablet, Take 1,000 Units by mouth daily., Disp: , Rfl:  .  cyclobenzaprine (FLEXERIL) 5 MG tablet, Take 5 mg by mouth 3 (three) times daily as needed for muscle spasms., Disp: , Rfl:  .  desoximetasone (TOPICORT) 0.25 % cream, APPLY TOPICALLY IF NEEDED FOR 14 DAYS, Disp: , Rfl: 0 .  glucose blood test strip, 1 each by Other route as needed for other (ONE TOUCH ULTRA TEST STRIPS AND LANCETS). Use as instructed, Disp: , Rfl:  .  Glycopyrrolate-Formoterol (BEVESPI AEROSPHERE) 9-4.8 MCG/ACT AERO, Inhale 2 puffs into the  lungs 2 (two) times daily., Disp: 4.8 Inhaler, Rfl: 3 .  ibuprofen (ADVIL) 200 MG tablet, Take 200 mg by mouth as needed., Disp: , Rfl:  .  lansoprazole (PREVACID) 30 MG capsule, Take 1 capsule (30 mg total) by mouth daily at 12 noon., Disp: 30 capsule, Rfl: 1 .  nystatin cream (MYCOSTATIN), APPLY TOPICALLY IF NEEDED FOR 14 DAYS, Disp: , Rfl: 0 .  pravastatin (PRAVACHOL) 40 MG tablet, Take 1 tablet (40 mg total) by mouth daily., Disp: 90 tablet, Rfl: 3 .  QUEtiapine (SEROQUEL) 100 MG tablet, Take 1.5 tablets (150 mg total) by mouth at bedtime., Disp: 45 tablet, Rfl: 2 .  QUEtiapine (SEROQUEL) 25 MG tablet, Take 1 tablet (25 mg total) by mouth 2 (two) times daily., Disp: 180 tablet, Rfl: 1 .  venlafaxine XR (EFFEXOR-XR) 75 MG 24 hr capsule, Take 1 capsule (75 mg total) by mouth daily with breakfast. Pt has supply, Disp: 90 capsule, Rfl: 1     ALLERGIES   Lovastatin; Paroxetine hcl; and Lamotrigine     REVIEW OF SYSTEMS   Review of Systems  Constitutional: Negative for fever, chills, weight loss and malaise/fatigue.  HENT: Negative for congestion.   Respiratory: Positive for shortness of breath and wheezing. Negative for cough, hemoptysis and sputum production.   Cardiovascular: Negative for chest pain, palpitations, orthopnea and leg swelling.  Gastrointestinal: Negative for heartburn,  nausea and vomiting.  Musculoskeletal: Negative for back pain.  Psychiatric/Behavioral: The patient is not nervous/anxious.   All other systems reviewed and are negative.    VS: BP 126/72 mmHg  Pulse 104  Ht 5\' 3"  (1.6 m)  Wt 145 lb (65.772 kg)  BMI 25.69 kg/m2  SpO2 96%     PHYSICAL EXAM   Physical Exam  Constitutional: She is oriented to person, place, and time. No distress.  HENT:  Mouth/Throat: No oropharyngeal exudate.  Cardiovascular: Normal rate, regular rhythm and normal heart sounds.   No murmur heard. Pulmonary/Chest: Effort normal and breath sounds normal. No stridor. No  respiratory distress. She has no wheezes. She has no rales.  Musculoskeletal: Normal range of motion. She exhibits no edema.  Neurological: She is alert and oriented to person, place, and time. No cranial nerve deficit.  Skin: Skin is warm. She is not diaphoretic.  Psychiatric: She has a normal mood and affect.        ASSESSMENT/PLAN   60 yo white female with ongoing tobacco use with cough and wheezing, signs and symptoms of reactive airways disease and intermittent acute bronchitis from smoking also with left lung solitary pulm nodule.  1.repeat CT chest in 3 months for interval changes 2.stop  AC/LABA  Bevespi, continue albuterol as needed 3.smoking cessation strongly advised -will prescribe nicotine patches 21 mg, advised to change routine/habits/establish quit date  Follow up 3 months with repeat CT chest  The Patient requires high complexity decision making for assessment and support, frequent evaluation and titration of therapies, application of advanced monitoring technologies and extensive interpretation of multiple databases.  Patient are satisfied with Plan of action and management. All questions answered   [name], M.D.  [facility]  Medical Director [place] Director [facility]

## 2015-09-01 NOTE — Progress Notes (Addendum)
Patient ID: Katelyn Cooper, female   DOB: [birth year], 60 y.o.   MRN: [id number]

## 2015-09-01 NOTE — Patient Instructions (Signed)
Smoking Cessation, Tips for Success If you are ready to quit smoking, congratulations! You have chosen to help yourself be healthier. Cigarettes bring nicotine, tar, carbon monoxide, and other irritants into your body. Your lungs, heart, and blood vessels will be able to work better without these poisons. There are many different ways to quit smoking. Nicotine gum, nicotine patches, a nicotine inhaler, or nicotine nasal spray can help with physical craving. Hypnosis, support groups, and medicines help break the habit of smoking. WHAT THINGS CAN I DO TO MAKE QUITTING EASIER?  Here are some tips to help you quit for good:  Pick a date when you will quit smoking completely. Tell all of your friends and family about your plan to quit on that date.  Do not try to slowly cut down on the number of cigarettes you are smoking. Pick a quit date and quit smoking completely starting on that day.  Throw away all cigarettes.   Clean and remove all ashtrays from your home, work, and car.  On a card, write down your reasons for quitting. Carry the card with you and read it when you get the urge to smoke.  Cleanse your body of nicotine. Drink enough water and fluids to keep your urine clear or pale yellow. Do this after quitting to flush the nicotine from your body.  Learn to predict your moods. Do not let a bad situation be your excuse to have a cigarette. Some situations in your life might tempt you into wanting a cigarette.  Never have "just one" cigarette. It leads to wanting another and another. Remind yourself of your decision to quit.  Change habits associated with smoking. If you smoked while driving or when feeling stressed, try other activities to replace smoking. Stand up when drinking your coffee. Brush your teeth after eating. Sit in a different chair when you read the paper. Avoid alcohol while trying to quit, and try to drink fewer caffeinated beverages. Alcohol and caffeine may urge you to  smoke.  Avoid foods and drinks that can trigger a desire to smoke, such as sugary or spicy foods and alcohol.  Ask people who smoke not to smoke around you.  Have something planned to do right after eating or having a cup of coffee. For example, plan to take a walk or exercise.  Try a relaxation exercise to calm you down and decrease your stress. Remember, you may be tense and nervous for the first 2 weeks after you quit, but this will pass.  Find new activities to keep your hands busy. Play with a pen, coin, or rubber band. Doodle or draw things on paper.  Brush your teeth right after eating. This will help cut down on the craving for the taste of tobacco after meals. You can also try mouthwash.   Use oral substitutes in place of cigarettes. Try using lemon drops, carrots, cinnamon sticks, or chewing gum. Keep them handy so they are available when you have the urge to smoke.  When you have the urge to smoke, try deep breathing.  Designate your home as a nonsmoking area.  If you are a heavy smoker, ask your health care provider about a prescription for nicotine chewing gum. It can ease your withdrawal from nicotine.  Reward yourself. Set aside the cigarette money you save and buy yourself something nice.  Look for support from others. Join a support group or smoking cessation program. Ask someone at home or at work to help you with your plan   to quit smoking.  Always ask yourself, "Do I need this cigarette or is this just a reflex?" Tell yourself, "Today, I choose not to smoke," or "I do not want to smoke." You are reminding yourself of your decision to quit.  Do not replace cigarette smoking with electronic cigarettes (commonly called e-cigarettes). The safety of e-cigarettes is unknown, and some may contain harmful chemicals.  If you relapse, do not give up! Plan ahead and think about what you will do the next time you get the urge to smoke. HOW WILL I FEEL WHEN I QUIT SMOKING? You  may have symptoms of withdrawal because your body is used to nicotine (the addictive substance in cigarettes). You may crave cigarettes, be irritable, feel very hungry, cough often, get headaches, or have difficulty concentrating. The withdrawal symptoms are only temporary. They are strongest when you first quit but will go away within 10-14 days. When withdrawal symptoms occur, stay in control. Think about your reasons for quitting. Remind yourself that these are signs that your body is healing and getting used to being without cigarettes. Remember that withdrawal symptoms are easier to treat than the major diseases that smoking can cause.  Even after the withdrawal is over, expect periodic urges to smoke. However, these cravings are generally short lived and will go away whether you smoke or not. Do not smoke! WHAT RESOURCES ARE AVAILABLE TO HELP ME QUIT SMOKING? Your health care provider can direct you to community resources or hospitals for support, which may include:  Group support.  Education.  Hypnosis.  Therapy.   This information is not intended to replace advice given to you by your health care provider. Make sure you discuss any questions you have with your health care provider.   Document Released: 12/16/2003 Document Revised: 04/09/2014 Document Reviewed: 09/04/2012 Elsevier Interactive Patient Education 2016 [employer]  

## 2015-09-06 ENCOUNTER — Ambulatory Visit (INDEPENDENT_AMBULATORY_CARE_PROVIDER_SITE_OTHER): Payer: 59 | Admitting: Psychiatry

## 2015-09-06 ENCOUNTER — Encounter: Payer: Self-pay | Admitting: Psychiatry

## 2015-09-06 VITALS — BP 122/78 | HR 118 | Temp 98.3°F | Ht 63.0 in | Wt 145.4 lb

## 2015-09-06 DIAGNOSIS — Z7289 Other problems related to lifestyle: Secondary | ICD-10-CM

## 2015-09-06 DIAGNOSIS — F3160 Bipolar disorder, current episode mixed, unspecified: Secondary | ICD-10-CM

## 2015-09-06 DIAGNOSIS — Z789 Other specified health status: Secondary | ICD-10-CM | POA: Diagnosis not present

## 2015-09-06 DIAGNOSIS — F4310 Post-traumatic stress disorder, unspecified: Secondary | ICD-10-CM | POA: Diagnosis not present

## 2015-09-06 MED ORDER — QUETIAPINE FUMARATE 200 MG PO TABS: 200.0000 mg | ORAL_TABLET | Freq: Every day | ORAL | Status: DC
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
## 2015-09-19 ENCOUNTER — Ambulatory Visit: Payer: 59 | Admitting: Psychiatry

## 2015-09-20 ENCOUNTER — Ambulatory Visit (INDEPENDENT_AMBULATORY_CARE_PROVIDER_SITE_OTHER): Payer: 59 | Admitting: Psychiatry

## 2015-09-20 ENCOUNTER — Encounter: Payer: Self-pay | Admitting: Psychiatry

## 2015-09-20 VITALS — BP 118/76 | HR 105 | Temp 98.1°F | Ht 63.0 in | Wt 146.2 lb

## 2015-09-20 DIAGNOSIS — F3160 Bipolar disorder, current episode mixed, unspecified: Secondary | ICD-10-CM | POA: Diagnosis not present

## 2015-09-20 DIAGNOSIS — F4310 Post-traumatic stress disorder, unspecified: Secondary | ICD-10-CM | POA: Diagnosis not present
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
## 2015-09-27 ENCOUNTER — Other Ambulatory Visit: Payer: Self-pay | Admitting: Family Medicine

## 2015-10-18 ENCOUNTER — Telehealth: Payer: Self-pay

## 2015-10-18 NOTE — Telephone Encounter (Signed)
left message to return call

## 2015-10-18 NOTE — Telephone Encounter (Signed)
pt called left message for me to call her.

## 2015-10-18 NOTE — Telephone Encounter (Signed)
pt states that the seroquel help but now she back to having trouble sleeping.  The medication is not working anymore. Please send something into [facility]

## 2015-10-19 MED ORDER — ZALEPLON 5 MG PO CAPS: 5.0000 mg | ORAL_CAPSULE | Freq: Every evening | ORAL | Status: DC | PRN

## 2015-10-19 NOTE — Telephone Encounter (Signed)
rx faxed and confirmed id # [id number] order # [id number]

## 2015-10-19 NOTE — Telephone Encounter (Signed)
[place] prescribed

## 2015-11-15 ENCOUNTER — Encounter: Payer: Self-pay | Admitting: Gastroenterology

## 2015-11-17 ENCOUNTER — Ambulatory Visit (INDEPENDENT_AMBULATORY_CARE_PROVIDER_SITE_OTHER): Payer: 59 | Admitting: Psychiatry

## 2015-11-17 ENCOUNTER — Encounter: Payer: Self-pay | Admitting: Psychiatry

## 2015-11-17 VITALS — BP 146/80 | HR 101 | Temp 98.2°F | Ht 63.0 in | Wt 156.4 lb

## 2015-11-17 DIAGNOSIS — F3160 Bipolar disorder, current episode mixed, unspecified: Secondary | ICD-10-CM

## 2015-11-17 DIAGNOSIS — F4312 Post-traumatic stress disorder, chronic: Secondary | ICD-10-CM | POA: Diagnosis not present

## 2015-11-17 MED ORDER — VENLAFAXINE HCL ER 75 MG PO CP24: 75.0000 mg | ORAL_CAPSULE | Freq: Every day | ORAL | 1 refills | Status: DC

## 2015-11-17 MED ORDER — ZALEPLON 5 MG PO CAPS: 5.0000 mg | ORAL_CAPSULE | Freq: Every evening | ORAL | 1 refills | Status: DC | PRN
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
## 2015-12-06 ENCOUNTER — Ambulatory Visit
Admission: RE | Admit: 2015-12-06 | Discharge: 2015-12-06 | Disposition: A | Discharge status: Home / Self Care | Payer: 59 | Source: Ambulatory Visit | Attending: Internal Medicine | Admitting: Internal Medicine

## 2015-12-06 DIAGNOSIS — R911 Solitary pulmonary nodule: Secondary | ICD-10-CM | POA: Diagnosis present

## 2015-12-06 DIAGNOSIS — I2510 Atherosclerotic heart disease of native coronary artery without angina pectoris: Secondary | ICD-10-CM | POA: Insufficient documentation

## 2015-12-06 DIAGNOSIS — J439 Emphysema, unspecified: Secondary | ICD-10-CM | POA: Diagnosis not present

## 2015-12-06 DIAGNOSIS — I700 Atherosclerosis of aorta: Secondary | ICD-10-CM | POA: Diagnosis not present

## 2015-12-08 ENCOUNTER — Encounter (INDEPENDENT_AMBULATORY_CARE_PROVIDER_SITE_OTHER): Payer: Self-pay

## 2015-12-08 ENCOUNTER — Ambulatory Visit (INDEPENDENT_AMBULATORY_CARE_PROVIDER_SITE_OTHER): Payer: 59 | Admitting: Internal Medicine

## 2015-12-08 ENCOUNTER — Encounter: Payer: Self-pay | Admitting: Internal Medicine

## 2015-12-08 VITALS — BP 130/74 | HR 114 | Ht 63.0 in | Wt 155.0 lb

## 2015-12-08 DIAGNOSIS — R911 Solitary pulmonary nodule: Secondary | ICD-10-CM | POA: Diagnosis not present

## 2015-12-08 DIAGNOSIS — R0683 Snoring: Secondary | ICD-10-CM

## 2015-12-08 DIAGNOSIS — G4719 Other hypersomnia: Secondary | ICD-10-CM | POA: Diagnosis not present

## 2015-12-08 DIAGNOSIS — R0789 Other chest pain: Secondary | ICD-10-CM | POA: Diagnosis not present

## 2015-12-08 NOTE — Addendum Note (Signed)
Addended by: Maryanna Shape A on: 12/08/2015 09:32 AM   Modules accepted: Orders

## 2015-12-08 NOTE — Progress Notes (Signed)
[facility] Consultation     Date: 12/08/2015,   MRN# [id number] Katelyn Cooper April 07, 1955 Code Status:  Code Status History    This patient does not have a recorded code status. Please follow your organizational policy for patients in this situation.     Hosp day:@LENGTHOFSTAYDAYS @ Referring MD: @ATDPROV @     PCP:      AdmissionWeight: 155 lb (70.3 kg)                 CurrentWeight: 155 lb (70.3 kg) Katelyn Cooper is a 60 y.o. old female seen in consultation for cough, pleuritic chest pain. Previous BQ patient     CHIEF COMPLAINT:   Follow up [place]   Patient  Patient has intermittent wheezing, uses albuterol as needed stopped smoking 10/2015 She Started vaping  No acute SOB, WOB. No cough  No signs if infection at this time 1 PPD for 40 years works on Teaching laboratory technician at [employer]  Patient  has been having sleep problems for over one year Patient has been having excessive daytime sleepiness Patient has been having extreme fatigue and tiredness, lack of energy Patient has been told that she has very  Loud snoring every night Patient has been told that she struggles to breathe at night and gasps for air  Patient also with intermittent chest tightness and discomfort, comes and goes, no aggravating factors, no relieving factors Happens at rest at times, happens with exertion at times  PFT's 08/2015 reviewed with patient ratio 80% Fev1 2.2 L 87% FVC 2.6L 83%  Ct chest 08/30/15 Left lung Nodule approx 6 MM increased from 2-3 MM from 2013.    CT chest 12/2015 Left Lung nodule approx 6 MM-no change from previous CT scans   Current Medication:   Current Outpatient Prescriptions:  .  albuterol (PROVENTIL HFA;VENTOLIN HFA) 108 (90 Base) MCG/ACT inhaler, Inhale 2 puffs into the lungs every 6 (six) hours as needed for wheezing or shortness of breath., Disp: 1 Inhaler, Rfl: 2 .  cholecalciferol (VITAMIN D) 1000 units tablet,  Take 1,000 Units by mouth daily., Disp: , Rfl:  .  cyclobenzaprine (FLEXERIL) 5 MG tablet, Take 5 mg by mouth 3 (three) times daily as needed for muscle spasms., Disp: , Rfl:  .  desoximetasone (TOPICORT) 0.25 % cream, APPLY TOPICALLY IF NEEDED FOR 14 DAYS, Disp: , Rfl: 0 .  glucose blood test strip, 1 each by Other route as needed for other (ONE TOUCH ULTRA TEST STRIPS AND LANCETS). Use as instructed, Disp: , Rfl:  .  ibuprofen (ADVIL) 200 MG tablet, Take 200 mg by mouth as needed., Disp: , Rfl:  .  lansoprazole (PREVACID) 30 MG capsule, Take 1 capsule (30 mg total) by mouth daily at 12 noon., Disp: 30 capsule, Rfl: 1 .  nicotine (NICODERM CQ - DOSED IN MG/24 HOURS) 21 mg/24hr patch, Place 1 patch (21 mg total) onto the skin daily., Disp: 28 patch, Rfl: 3 .  nystatin cream (MYCOSTATIN), APPLY TOPICALLY IF NEEDED FOR 14 DAYS, Disp: , Rfl: 0 .  pravastatin (PRAVACHOL) 40 MG tablet, Take 1 tablet by mouth  daily, Disp: 90 tablet, Rfl: 1 .  QUEtiapine (SEROQUEL) 200 MG tablet, Take 1 tablet (200 mg total) by mouth at bedtime. (Patient taking differently: Take 300 mg by mouth at bedtime. ), Disp: 90 tablet, Rfl: 1 .  QUEtiapine (SEROQUEL) 25 MG tablet, Take 1 tablet (25 mg total) by mouth 2 (two) times daily., Disp: 180 tablet,  Rfl: 1 .  venlafaxine XR (EFFEXOR-XR) 75 MG 24 hr capsule, Take 1 capsule (75 mg total) by mouth daily with breakfast. Pt has supply (Patient taking differently: Take 75 mg by mouth 2 (two) times daily. Pt has supply), Disp: 90 capsule, Rfl: 1 .  zaleplon (SONATA) 5 MG capsule, Take 1 capsule (5 mg total) by mouth at bedtime as needed for sleep., Disp: 30 capsule, Rfl: 1     ALLERGIES   Lovastatin; Paroxetine hcl; and Lamotrigine     REVIEW OF SYSTEMS   Review of Systems  Constitutional: Negative for chills, fever, malaise/fatigue and weight loss.  HENT: Negative for congestion.   Respiratory: Positive for wheezing. Negative for cough, hemoptysis, sputum production  and shortness of breath.   Cardiovascular: Negative for chest pain, palpitations, orthopnea and leg swelling.  Gastrointestinal: Negative for heartburn, nausea and vomiting.  Musculoskeletal: Negative for back pain.  Psychiatric/Behavioral: The patient is not nervous/anxious.   All other systems reviewed and are negative.    VS: BP 130/74 (BP Location: Left Arm, Cuff Size: Normal)   Pulse (!) 114   Ht 5\' 3"  (1.6 m)   Wt 155 lb (70.3 kg)   SpO2 98%   BMI 27.46 kg/m      PHYSICAL EXAM   Physical Exam  Constitutional: She is oriented to person, place, and time. No distress.  HENT:  Mouth/Throat: No oropharyngeal exudate.  Cardiovascular: Normal rate, regular rhythm and normal heart sounds.   No murmur heard. Pulmonary/Chest: Effort normal and breath sounds normal. No stridor. No respiratory distress. She has no wheezes. She has no rales.  Musculoskeletal: Normal range of motion. She exhibits no edema.  Neurological: She is alert and oriented to person, place, and time. No cranial nerve deficit.  Skin: Skin is warm. She is not diaphoretic.  Psychiatric: She has a normal mood and affect.        ASSESSMENT/PLAN   60 yo white female with signs and symptoms of intermittent reactive airways disease from vaping, quit tobacoo abuse 10/2015  left lung solitary pulm nodule that is stable over past 6 months, with signs and symptoms of sleep apnea. Patient with intermittent chest pain  1.repeat CT chest in 6 months for interval changes 2.continue albuterol as needed 3.smoking/Vaping  cessation strongly advised 4.recommend cardiology referral  5.obtain sleep study to assess for sleep apnea  Follow up 6 months  The Patient requires high complexity decision making for assessment and support, frequent evaluation and titration of therapies, application of advanced monitoring technologies and extensive interpretation of multiple databases.  Patient are satisfied with Plan of action and  management. All questions answered   [name], M.D.  [facility]  Medical Director [place] Director [facility] Cardio-P[facility] Department

## 2015-12-08 NOTE — Patient Instructions (Signed)
1.obtain cardiology referral for atypical chest pain 2.obtain sleep study 3.repeat CT chest in 6 months 4.stop vaping   Sleep Apnea  Sleep apnea is a sleep disorder characterized by abnormal pauses in breathing while you sleep. When your breathing pauses, the level of oxygen in your blood decreases. This causes you to move out of deep sleep and into light sleep. As a result, your quality of sleep is poor, and the system that carries your blood throughout your body (cardiovascular system) experiences stress. If sleep apnea remains untreated, the following conditions can develop:  High blood pressure (hypertension).  Coronary artery disease.  Inability to achieve or maintain an erection (impotence).  Impairment of your thought process (cognitive dysfunction). There are three types of sleep apnea: 1. Obstructive sleep apnea--Pauses in breathing during sleep because of a blocked airway. 2. Central sleep apnea--Pauses in breathing during sleep because the area of the brain that controls your breathing does not send the correct signals to the muscles that control breathing. 3. Mixed sleep apnea--A combination of both obstructive and central sleep apnea. RISK FACTORS The following risk factors can increase your risk of developing sleep apnea:  Being overweight.  Smoking.  Having narrow passages in your nose and throat.  Being of older age.  Being female.  Alcohol use.  Sedative and tranquilizer use.  Ethnicity. Among individuals younger than 35 years, African Americans are at increased risk of sleep apnea. SYMPTOMS   Difficulty staying asleep.  Daytime sleepiness and fatigue.  Loss of energy.  Irritability.  Loud, heavy snoring.  Morning headaches.  Trouble concentrating.  Forgetfulness.  Decreased interest in sex.  Unexplained sleepiness. DIAGNOSIS  In order to diagnose sleep apnea, your caregiver will perform a physical examination. A sleep study done in the  comfort of your own home may be appropriate if you are otherwise healthy. Your caregiver may also recommend that you spend the night in a sleep lab. In the sleep lab, several monitors record information about your heart, lungs, and brain while you sleep. Your leg and arm movements and blood oxygen level are also recorded. TREATMENT The following actions may help to resolve mild sleep apnea:  Sleeping on your side.   Using a decongestant if you have nasal congestion.   Avoiding the use of depressants, including alcohol, sedatives, and narcotics.   Losing weight and modifying your diet if you are overweight. There also are devices and treatments to help open your airway:  Oral appliances. These are custom-made mouthpieces that shift your lower jaw forward and slightly open your bite. This opens your airway.  Devices that create positive airway pressure. This positive pressure "splints" your airway open to help you breathe better during sleep. The following devices create positive airway pressure:  Continuous positive airway pressure (CPAP) device. The CPAP device creates a continuous level of air pressure with an air pump. The air is delivered to your airway through a mask while you sleep. This continuous pressure keeps your airway open.  Nasal expiratory positive airway pressure (EPAP) device. The EPAP device creates positive air pressure as you exhale. The device consists of single-use valves, which are inserted into each nostril and held in place by adhesive. The valves create very little resistance when you inhale but create much more resistance when you exhale. That increased resistance creates the positive airway pressure. This positive pressure while you exhale keeps your airway open, making it easier to breath when you inhale again.  Bilevel positive airway pressure (BPAP) device. The  BPAP device is used mainly in patients with central sleep apnea. This device is similar to the CPAP  device because it also uses an air pump to deliver continuous air pressure through a mask. However, with the BPAP machine, the pressure is set at two different levels. The pressure when you exhale is lower than the pressure when you inhale.  Surgery. Typically, surgery is only done if you cannot comply with less invasive treatments or if the less invasive treatments do not improve your condition. Surgery involves removing excess tissue in your airway to create a wider passage way.   This information is not intended to replace advice given to you by your health care provider. Make sure you discuss any questions you have with your health care provider.   Document Released: 03/09/2002 Document Revised: 04/09/2014 Document Reviewed: 07/26/2011 Elsevier Interactive Patient Education [employer].

## 2015-12-12 ENCOUNTER — Ambulatory Visit (INDEPENDENT_AMBULATORY_CARE_PROVIDER_SITE_OTHER): Payer: 59 | Admitting: Internal Medicine

## 2015-12-12 ENCOUNTER — Encounter: Payer: Self-pay | Admitting: Internal Medicine

## 2015-12-12 VITALS — BP 120/70 | HR 92 | Ht 63.0 in | Wt 165.5 lb

## 2015-12-12 DIAGNOSIS — R5383 Other fatigue: Secondary | ICD-10-CM

## 2015-12-12 DIAGNOSIS — R0789 Other chest pain: Secondary | ICD-10-CM | POA: Diagnosis not present

## 2015-12-12 DIAGNOSIS — R0602 Shortness of breath: Secondary | ICD-10-CM | POA: Diagnosis not present

## 2015-12-12 DIAGNOSIS — I739 Peripheral vascular disease, unspecified: Secondary | ICD-10-CM | POA: Diagnosis not present

## 2015-12-12 MED ORDER — ASPIRIN EC 81 MG PO TBEC: 81.0000 mg | DELAYED_RELEASE_TABLET | Freq: Every day | ORAL | Status: AC

## 2015-12-12 MED ORDER — NITROGLYCERIN 0.4 MG SL SUBL: 0.4000 mg | SUBLINGUAL_TABLET | SUBLINGUAL | 99 refills | Status: DC | PRN

## 2015-12-12 NOTE — Patient Instructions (Addendum)
Medication Instructions:  Your physician has recommended you make the following change in your medication:  1. Start Aspirin 81 mg Once Daily 2. Nitroglycerin 0.4 mg under the tongue for chest pain. May take 1 tablet every 5 minutes as needed for chest pain up to a total of 3. Please read attached materials before taking first dose.   Testing/Procedures: Your physician has requested that you have an echocardiogram. Echocardiography is a painless test that uses sound waves to create images of your heart. It provides your doctor with information about the size and shape of your heart and how well your heart's chambers and valves are working. This procedure takes approximately one hour. There are no restrictions for this procedure.  Your physician has requested that you have an ankle brachial index (ABI). During this test an ultrasound and blood pressure cuff are used to evaluate the arteries that supply the arms and legs with blood. Allow thirty minutes for this exam. There are no restrictions or special instructions.  Your physician has requested that you have a lower or upper extremity arterial duplex. This test is an ultrasound of the arteries in the legs or arms. It looks at arterial blood flow in the legs and arms. Allow one hour for Lower and Upper Arterial scans. There are no restrictions or special instructions  [place]  Your caregiver has ordered a Stress Test with nuclear imaging. The purpose of this test is to evaluate the blood supply to your heart muscle. This procedure is referred to as a "Non-Invasive Stress Test." This is because other than having an IV started in your vein, nothing is inserted or "invades" your body. Cardiac stress tests are done to find areas of poor blood flow to the heart by determining the extent of coronary artery disease (CAD). Some patients exercise on a treadmill, which naturally increases the blood flow to your heart, while others who are  unable to walk  on a treadmill due to physical limitations have a pharmacologic/chemical stress agent called Lexiscan . This medicine will mimic walking on a treadmill by temporarily increasing your coronary blood flow.   Please note: these test may take anywhere between 2-4 hours to complete  PLEASE REPORT TO [place] AT THE FIRST DESK WILL DIRECT YOU WHERE TO GO  Date of Procedure:_Friday, December 23, 2015 at 52:30AM_  Arrival Time for Procedure:__Arrive at 07:15AM to register___    PLEASE NOTIFY THE OFFICE AT LEAST 24 HOURS IN ADVANCE IF YOU ARE UNABLE TO [place].  [phone] AND  PLEASE NOTIFY NUCLEAR MEDICINE AT [facility] AT LEAST 24 HOURS IN ADVANCE IF YOU ARE UNABLE TO KEEP YOUR APPOINTMENT. [phone]  How to prepare for your [facility] test:  1. Do not eat or drink after midnight 2. No caffeine for 24 hours prior to test 3. No smoking 24 hours prior to test. 4. Your medication may be taken with water.  If your doctor stopped a medication because of this test, do not take that medication. 5. Ladies, please do not wear dresses.  Skirts or pants are appropriate. Please wear a short sleeve shirt. 6. No perfume, cologne or lotion. 7. Wear comfortable walking shoes. No heels!   Follow-Up: Your physician recommends that you schedule a follow-up appointment in: 1 month with Dr. Saunders Revel.  It was a pleasure seeing you today here in the office. Please do not hesitate to give [name] a call back if you have any further questions. [phone]  Lesleigh Noe RN, BSN     Echocardiogram An echocardiogram, or echocardiography, uses sound waves (ultrasound) to produce an image of your heart. The echocardiogram is simple, painless, obtained within a short period of time, and offers valuable information to your health care provider. The images from an echocardiogram can provide information such as:  Evidence of coronary artery disease (CAD).  Heart size.  Heart  muscle function.  Heart valve function.  Aneurysm detection.  Evidence of a past heart attack.  Fluid buildup around the heart.  Heart muscle thickening.  Assess heart valve function. LET [facility] CARE PROVIDER KNOW ABOUT:  Any allergies you have.  All medicines you are taking, including vitamins, herbs, eye drops, creams, and over-the-counter medicines.  Previous problems you or members of your family have had with the use of anesthetics.  Any blood disorders you have.  Previous surgeries you have had.  Medical conditions you have.  Possibility of pregnancy, if this applies. BEFORE THE PROCEDURE  No special preparation is needed. Eat and drink normally.  PROCEDURE   In order to produce an image of your heart, gel will be applied to your chest and a wand-like tool (transducer) will be moved over your chest. The gel will help transmit the sound waves from the transducer. The sound waves will harmlessly bounce off your heart to allow the heart images to be captured in real-time motion. These images will then be recorded.  You may need an IV to receive a medicine that improves the quality of the pictures. AFTER THE PROCEDURE You may return to your normal schedule including diet, activities, and medicines, unless your health care provider tells you otherwise.   This information is not intended to replace advice given to you by your health care provider. Make sure you discuss any questions you have with your health care provider.   Document Released: 03/16/2000 Document Revised: 04/09/2014 Document Reviewed: 11/24/2012 Elsevier Interactive Patient Education 2016 [place].   Pharmacologic Stress Electrocardiogram A pharmacologic stress electrocardiogram is a heart (cardiac) test that uses nuclear imaging to evaluate the blood supply to your heart. This test may also be called a pharmacologic stress electrocardiography. Pharmacologic means that a medicine is used to  increase your heart rate and blood pressure.  This stress test is done to find areas of poor blood flow to the heart by determining the extent of coronary artery disease (CAD). Some people exercise on a treadmill, which naturally increases the blood flow to the heart. For those people unable to exercise on a treadmill, a medicine is used. This medicine stimulates your heart and will cause your heart to beat harder and more quickly, as if you were exercising.  Pharmacologic stress tests can help determine:  The adequacy of blood flow to your heart during increased levels of activity in order to clear you for discharge home.  The extent of coronary artery blockage caused by CAD.  Your prognosis if you have suffered a heart attack.  The effectiveness of cardiac procedures done, such as an angioplasty, which can increase the circulation in your coronary arteries.  Causes of chest pain or pressure. LET [facility] CARE PROVIDER KNOW ABOUT:  Any allergies you have.  All medicines you are taking, including vitamins, herbs, eye drops, creams, and over-the-counter medicines.  Previous problems you or members of your family have had with the use of anesthetics.  Any blood disorders you have.  Previous surgeries you have had.  Medical conditions you have.  Possibility  of pregnancy, if this applies.  If you are currently breastfeeding. RISKS AND COMPLICATIONS Generally, this is a safe procedure. However, as with any procedure, complications can occur. Possible complications include:  You develop pain or pressure in the following areas:  Chest.  Jaw or neck.  Between your shoulder blades.  Radiating down your left arm.  Headache.  Dizziness or light-headedness.  Shortness of breath.  Increased or irregular heartbeat.  Low blood pressure.  Nausea or vomiting.  Flushing.  Redness going up the arm and slight pain during injection of medicine.  Heart attack (rare). BEFORE  THE PROCEDURE   Avoid all forms of caffeine for 24 hours before your test or as directed by your health care provider. This includes coffee, tea (even decaffeinated tea), caffeinated sodas, chocolate, cocoa, and certain pain medicines.  Follow your health care provider's instructions regarding eating and drinking before the test.  Take your medicines as directed at regular times with water unless instructed otherwise. Exceptions may include:  If you have diabetes, ask how you are to take your insulin or pills. It is common to adjust insulin dosing the morning of the test.  If you are taking beta-blocker medicines, it is important to talk to your health care provider about these medicines well before the date of your test. Taking beta-blocker medicines may interfere with the test. In some cases, these medicines need to be changed or stopped 24 hours or more before the test.  If you wear a nitroglycerin patch, it may need to be removed prior to the test. Ask your health care provider if the patch should be removed before the test.  If you use an inhaler for any breathing condition, bring it with you to the test.  If you are an outpatient, bring a snack so you can eat right after the stress phase of the test.  Do not smoke for 4 hours prior to the test or as directed by your health care provider.  Do not apply lotions, powders, creams, or oils on your chest prior to the test.  Wear comfortable shoes and clothing. Let your health care provider know if you were unable to complete or follow the preparations for your test. PROCEDURE   Multiple patches (electrodes) will be put on your chest. If needed, small areas of your chest may be shaved to get better contact with the electrodes. Once the electrodes are attached to your body, multiple wires will be attached to the electrodes, and your heart rate will be monitored.  An IV access will be started. A nuclear trace (isotope) is given. The isotope  may be given intravenously, or it may be swallowed. Nuclear refers to several types of radioactive isotopes, and the nuclear isotope lights up the arteries so that the nuclear images are clear. The isotope is absorbed by your body. This results in low radiation exposure.  A resting nuclear image is taken to show how your heart functions at rest.  A medicine is given through the IV access.  A second scan is done about 1 hour after the medicine injection and determines how your heart functions under stress.  During this stress phase, you will be connected to an electrocardiogram machine. Your blood pressure and oxygen levels will be monitored. AFTER THE PROCEDURE   Your heart rate and blood pressure will be monitored after the test.  You may return to your normal schedule, including diet,activities, and medicines, unless your health care provider tells you otherwise.   This  information is not intended to replace advice given to you by your health care provider. Make sure you discuss any questions you have with your health care provider.   Document Released: 08/05/2008 Document Revised: 03/24/2013 Document Reviewed: 11/24/2012 Elsevier Interactive Patient Education 2016 [employer]   Nitroglycerin sublingual tablets What is this medicine? NITROGLYCERIN (nye troe GLI ser in) is a type of vasodilator. It relaxes blood vessels, increasing the blood and oxygen supply to your heart. This medicine is used to relieve chest pain caused by angina. It is also used to prevent chest pain before activities like climbing stairs, going outdoors in cold weather, or sexual activity. This medicine may be used for other purposes; ask your health care provider or pharmacist if you have questions. What should I tell my health care provider before I take this medicine? They need to know if you have any of these conditions: -anemia -head injury, recent stroke, or bleeding in the brain -liver  disease -previous heart attack -an unusual or allergic reaction to nitroglycerin, other medicines, foods, dyes, or preservatives -pregnant or trying to get pregnant -breast-feeding How should I use this medicine? Take this medicine by mouth as needed. At the first sign of an angina attack (chest pain or tightness) place one tablet under your tongue. You can also take this medicine 5 to 10 minutes before an event likely to produce chest pain. Follow the directions on the prescription label. Let the tablet dissolve under the tongue. Do not swallow whole. Replace the dose if you accidentally swallow it. It will help if your mouth is not dry. Saliva around the tablet will help it to dissolve more quickly. Do not eat or drink, smoke or chew tobacco while a tablet is dissolving. If you are not better within 5 minutes after taking ONE dose of nitroglycerin, call 9-1-1 immediately to seek emergency medical care. Do not take more than 3 nitroglycerin tablets over 15 minutes. If you take this medicine often to relieve symptoms of angina, your doctor or health care professional may provide you with different instructions to manage your symptoms. If symptoms do not go away after following these instructions, it is important to call 9-1-1 immediately. Do not take more than 3 nitroglycerin tablets over 15 minutes. Talk to your pediatrician regarding the use of this medicine in children. Special care may be needed. Overdosage: If you think you have taken too much of this medicine contact a poison control center or emergency room at once. NOTE: This medicine is only for you. Do not share this medicine with others. What if I miss a dose? This does not apply. This medicine is only used as needed. What may interact with this medicine? Do not take this medicine with any of the following medications: -certain migraine medicines like ergotamine and dihydroergotamine (DHE) -medicines used to treat erectile dysfunction like  sildenafil, tadalafil, and vardenafil -riociguat This medicine may also interact with the following medications: -alteplase -aspirin -heparin -medicines for high blood pressure -medicines for mental depression -other medicines used to treat angina -phenothiazines like chlorpromazine, mesoridazine, prochlorperazine, thioridazine This list may not describe all possible interactions. Give your health care provider a list of all the medicines, herbs, non-prescription drugs, or dietary supplements you use. Also tell them if you smoke, drink alcohol, or use illegal drugs. Some items may interact with your medicine. What should I watch for while using this medicine? Tell your doctor or health care professional if you feel your medicine is no longer working. Keep  this medicine with you at all times. Sit or lie down when you take your medicine to prevent falling if you feel dizzy or faint after using it. Try to remain calm. This will help you to feel better faster. If you feel dizzy, take several deep breaths and lie down with your feet propped up, or bend forward with your head resting between your knees. You may get drowsy or dizzy. Do not drive, use machinery, or do anything that needs mental alertness until you know how this drug affects you. Do not stand or sit up quickly, especially if you are an older patient. This reduces the risk of dizzy or fainting spells. Alcohol can make you more drowsy and dizzy. Avoid alcoholic drinks. Do not treat yourself for coughs, colds, or pain while you are taking this medicine without asking your doctor or health care professional for advice. Some ingredients may increase your blood pressure. What side effects may I notice from receiving this medicine? Side effects that you should report to your doctor or health care professional as soon as possible: -blurred vision -dry mouth -skin rash -sweating -the feeling of extreme pressure in the head -unusually weak or  tired Side effects that usually do not require medical attention (report to your doctor or health care professional if they continue or are bothersome): -flushing of the face or neck -headache -irregular heartbeat, palpitations -nausea, vomiting This list may not describe all possible side effects. Call your doctor for medical advice about side effects. You may report side effects to FDA at 1-800-FDA-1088. Where should I keep my medicine? Keep out of the reach of children. Store at room temperature between 20 and 25 degrees C (68 and 77 degrees F). Store in [employer]. Protect from light and moisture. Keep tightly closed. Throw away any unused medicine after the expiration date. NOTE: This sheet is a summary. It may not cover all possible information. If you have questions about this medicine, talk to your doctor, pharmacist, or health care provider.    2016, Elsevier/[employer]. (2013-01-15 17:57:36)

## 2015-12-12 NOTE — Progress Notes (Signed)
New Outpatient Visit Date: 12/12/2015  Referring Provider: Flora Lipps, MD  CC:  Chief Complaint  Patient presents with  . Other    Ref by Dr. Mortimer Fries for cardiac evaluation. Pt. c/o chest tightness, fatigue and shortness of breath. Meds reviewed by the patient verbally.     HPI:  Ms. Cichy is a 60 y.o. year-old female with history of diet controlled diabetes mellitus, peripheral vascular disease with reported intervention to the abdominal aorta and/or left iliac artery in the 1990s, thoracic outlet syndrome status post bilateral first rib resections in the [id number], GERD complicated by esophageal stricture, hyperlipidemia, and anxiety/depression, who has been referred by Dr. Mortimer Fries for evaluation of chest pain and shortness of breath. The patient reports marked fatigue with exertional dyspnea over the last 8-9 months. She also has occasional substernal chest tightness without radiation that lasts 5-10 minutes after performing moderately strenuous activity. The pain's maximum intensity is 3-4/10, with gradual resolution with rest. The patient denies prior cardiac evaluation. Pulmonary work-up by Dr. Margit Hanks was notable only for intermittent reactive airway disease and solitary pulmonary nodule. However, given that her symptoms are now present with even mild activities like mopping around the house, she has been referred for cardiovascular evaluation.  The patient also reports significant left hip/thigh discomfort with ambulation. This seems to be getting worse over the last few months and is now at the point where she can only walk for short periods before needing to stop. This weekend, she went to the fair and had to stop 3 times to rest while walking a ~100 feet due to left leg pain. She has been evaluated by a chiropractor due to suspected musculoskeletal etiology related to a remote tailbone fracture. However, her practically therapy did not alleviate her pain. She currently takes ibuprofen 800 mg daily  for pain.  She has not had had any imaging of her lower extremity arteries for several years.  --------------------------------------------------------------------------------------------------  Past Medical History:  Diagnosis Date  . Anxiety   . Depression   . Diabetes type 2, controlled ([place])   . Esophageal stricture   . GERD (gastroesophageal reflux disease)   . History of chicken pox   . Hyperlipidemia   . Internal hemorrhoids   . Panic disorder     Past Surgical History:  Procedure Laterality Date  . [place]   stent placement  . APPENDECTOMY  1998  . FOOT SURGERY     Right  . pap smear  03/2010   Done by [name], normal  . REFRACTIVE SURGERY     right  . RIB RESECTION  [id number]  . SHOULDER SURGERY  2005   left  . UPPER GI ENDOSCOPY  09/19/2011   Stricture at GE junction, dilated. Mild gastritis and duodenitis. Small hiatal hernia    Outpatient Encounter Prescriptions as of 12/12/2015  Medication Sig  . albuterol (PROVENTIL HFA;VENTOLIN HFA) 108 (90 Base) MCG/ACT inhaler Inhale 2 puffs into the lungs every 6 (six) hours as needed for wheezing or shortness of breath.  . cholecalciferol (VITAMIN D) 1000 units tablet Take 1,000 Units by mouth daily.  . cyclobenzaprine (FLEXERIL) 5 MG tablet Take 5 mg by mouth 3 (three) times daily as needed for muscle spasms.  [name] desoximetasone (TOPICORT) 0.25 % cream APPLY TOPICALLY IF NEEDED FOR 14 DAYS  . glucose blood test strip 1 each by Other route as needed for other (ONE TOUCH ULTRA TEST STRIPS AND LANCETS). Use as instructed  . ibuprofen (ADVIL)  200 MG tablet Take 200 mg by mouth as needed.  . lansoprazole (PREVACID) 30 MG capsule Take 1 capsule (30 mg total) by mouth daily at 12 noon.  . nicotine (NICODERM CQ - DOSED IN MG/24 HOURS) 21 mg/24hr patch Place 1 patch (21 mg total) onto the skin daily.  [name] nystatin cream (MYCOSTATIN) APPLY TOPICALLY IF NEEDED FOR 14 DAYS  . pravastatin (PRAVACHOL) 40 MG tablet  Take 1 tablet by mouth  daily  . QUEtiapine (SEROQUEL) 200 MG tablet Take 1 tablet (200 mg total) by mouth at bedtime. (Patient taking differently: Take 300 mg by mouth at bedtime. )  . QUEtiapine (SEROQUEL) 25 MG tablet Take 1 tablet (25 mg total) by mouth 2 (two) times daily.  [name] venlafaxine XR (EFFEXOR-XR) 75 MG 24 hr capsule Take 1 capsule (75 mg total) by mouth daily with breakfast. Pt has supply (Patient taking differently: Take 75 mg by mouth 2 (two) times daily. Pt has supply)  . zaleplon (SONATA) 5 MG capsule Take 1 capsule (5 mg total) by mouth at bedtime as needed for sleep.   No facility-administered encounter medications on file as of 12/12/2015.    Allergies: Lovastatin; Paroxetine hcl; and Lamotrigine  Social History   Social History  . Marital status: Married    Spouse name: N/A  . Number of children: 0  . Years of education: N/A   Occupational History  . computer [place] History Main Topics  . Smoking status: Former Smoker    Packs/day: 1.00    Years: 40.00    Types: Cigarettes, E-cigarettes    Start date: 03/01/1975  . Smokeless tobacco: Never Used     Comment: quit smoking cig 10/05/15 occ smoke ecig  . Alcohol use No  . Drug use: No  . Sexual activity: Not Currently   Other Topics Concern  . Not on file   Social History Narrative  . No narrative on file    Family History  Problem Relation Age of Onset  . Lung cancer Mother     was a smoker  . Emphysema Mother   . Cancer Mother     Lung  . Alcohol abuse Mother   . Depression Mother   . Drug abuse Sister   . Breast cancer Sister   . Emphysema Sister   . Bipolar disorder Sister   . Alcohol abuse Other   . Depression Other   . Bipolar disorder Other   . Heart disease Other   . Vision loss Other   . Alcohol abuse Father   . Mood Disorder Father   . Heart disease Maternal Grandmother   . Esophageal cancer Maternal Aunt     Review of Systems: Patient reports long history of  anxiety/depresison that is currently under control with close outpatient psychiatric follow-up.  Otherwise, a 12-system review of systems was performed and was negative except as noted in the HPI.  --------------------------------------------------------------------------------------------------  PHYSICAL EXAM: BP 120/70 (BP Location: Right Arm, Patient Position: Sitting, Cuff Size: Normal)   Pulse 92   Ht 5\' 3"  (1.6 m)   Wt 165 lb 8 oz (75.1 kg)   SpO2 99%   BMI 29.32 kg/m   General:  Well-developed, well nourished female, seated comfortably in the exam room. HEENT: No conjunctival pallor or scleral icterus.  Moist mucous membranes.  OP clear. Neck: Supple without lymphadenopathy, thyromegaly, JVD, or HJR.  No carotid bruit. Lungs: Normal work of breathing.  Clear to auscultation bilaterally without wheezes or  crackles. CV: Regular rate and rhythm without murmurs, rubs, or gallops.  Non-displaced PMI. Abd: Bowel sounds present.  Soft, NT/ND without hepatosplenomegaly Ext: No lower extremity edema.  Radial and DP pulses 2+ bilaterally; PT pulses 1+ bilaterally. Skin: warm and dry without rash; no ulcers/lesions on feet Neuro: CNIII-XII intact.  Strength and fine-touch sensation intact in upper and lower extremities bilaterally. Psych: Normal mood and affect.  EKG:  Normal sinus rhythm (rate 91 bpm) without significant abnormalities.  --------------------------------------------------------------------------------------------------  ASSESSMENT AND PLAN: 1. Chest tightness, shortness of breath, and fatigue Given exertional nature of symptoms and multiple risk factors, including PVD, DM, HLD, and prior tobacco abuse, she certainly is at risk for CAD.  We discussed various treatment options, including non-invasive testing and cardiac catheterization.  We have agreed to proceed with pharmacologic myocardial perfusion stress test as well as transthoracic echocardiogram.  Her EKG today is  without significant abnromalities.  The patient will start taking ASA 81 mg daily.  I have also provided her with a prescription for sublingual nitroglycerin, to be used as needed.  I counseled her about warning signs, including worsening pain/dyspnea or symptoms at rest, that should prompt her to seek immediate medical attention.  2. Claudication [facility]) Ms. Aynes reports progressive left hip/thigh pain with ambulation.  Given her report of remote aortic/left iliac intervention, her symptoms could be due to claudication (though she has palpable pedal pulses on exam today.  Will will perform aortoiliac and lower extremity duplex for further evaluation.  Follow-up: Return to clinic in 1 month to discuss results and reassess symptoms.   Nelva Bush, MD 12/12/2015 9:44 AM

## 2015-12-16 ENCOUNTER — Other Ambulatory Visit: Payer: Self-pay | Admitting: Internal Medicine

## 2015-12-16 DIAGNOSIS — I739 Peripheral vascular disease, unspecified: Secondary | ICD-10-CM

## 2015-12-23 ENCOUNTER — Encounter
Admission: RE | Admit: 2015-12-23 | Discharge: 2015-12-23 | Disposition: A | Discharge status: Home / Self Care | Payer: 59 | Source: Ambulatory Visit | Attending: Internal Medicine | Admitting: Internal Medicine

## 2015-12-23 DIAGNOSIS — R0789 Other chest pain: Secondary | ICD-10-CM | POA: Diagnosis present

## 2015-12-23 DIAGNOSIS — R0602 Shortness of breath: Secondary | ICD-10-CM | POA: Insufficient documentation

## 2015-12-23 LAB — NM MYOCAR MULTI W/SPECT W/WALL MOTION / EF
CHL CUP MPHR: 159 {beats}/min
CHL CUP NUCLEAR SDS: 0
CSEPEDS: 0 s
CSEPEW: 1 METS
CSEPPHR: 111 {beats}/min
Exercise duration (min): 0 min
LV dias vol: 30 mL (ref 46–106)
LVSYSVOL: 9 mL
NUC STRESS TID: 1.24
Percent HR: 69 %
Rest HR: 86 {beats}/min
SRS: 0
SSS: 0

## 2015-12-23 MED ORDER — REGADENOSON 0.4 MG/5ML IV SOLN
0.4000 mg | Freq: Once | INTRAVENOUS | Status: AC
  Administered 2015-12-23: 0.4 mg via INTRAVENOUS

## 2015-12-23 MED ORDER — TECHNETIUM TC 99M TETROFOSMIN IV KIT
30.0000 | PACK | Freq: Once | INTRAVENOUS | Status: AC | PRN
  Administered 2015-12-23: 30.423 via INTRAVENOUS

## 2015-12-23 MED ORDER — TECHNETIUM TC 99M TETROFOSMIN IV KIT
11.8800 | PACK | Freq: Once | INTRAVENOUS | Status: AC | PRN
  Administered 2015-12-23: 11.88 via INTRAVENOUS

## 2015-12-26 ENCOUNTER — Telehealth: Payer: Self-pay | Admitting: Internal Medicine

## 2015-12-26 MED ORDER — METOPROLOL TARTRATE 25 MG PO TABS: 12.5000 mg | ORAL_TABLET | Freq: Two times a day (BID) | ORAL | 3 refills | Status: DC

## 2015-12-26 NOTE — Telephone Encounter (Signed)
I attempted to reach the patient by phone to discuss results of last week's myocardial perfusion stress test, which showed a small reversible defect involving that apical anterior and apical segments.  This may represent ischemia or artifact.  Given low risk nature of the study, it would be reasonable to optimize medical therapy and proceed to coronary angiography if symptoms persist.  Would favor adding metoprolol while we await results of echo and peripheral vascular studies.  Nelva Bush, MD [facility] Pager: [phone]

## 2015-12-26 NOTE — Telephone Encounter (Signed)
Left voicemail message for patient to call back for results.  

## 2015-12-26 NOTE — Telephone Encounter (Signed)
Spoke with patient and reviewed stress test results along with recommendations. She verbalized understanding and states that while she was in [place] she had increased shortness of breath and since her return she has increased chest tightness since having her stress test. Let her know that I would forward on to Dr. Saunders Revel for his awareness and to start on the metoprolol as discussed. She verbalized understanding and had no further questions. She is moving forward with other testing.

## 2015-12-26 NOTE — Telephone Encounter (Signed)
Thanks

## 2015-12-27 NOTE — Telephone Encounter (Signed)
I spoke with Katelyn Cooper regarding her abnormal stress test as well as continued chest tightness and shortness of breath with activity.  We have agreed to start metoprolol 12.5 mg BID and proceed with echo and lower extremity arterial Dopplers tomorrow, as scheduled.  We will follow-up by phone later this week to discuss those results and to reassess her symptoms.  If she has not noticed any improvement, we will make arrangements for outpatient left heart catheterization.  I advised the patient to continue her current medications and to seek immediate medical attention for worsening chest pain or chest pain at rest.  Nelva Bush, MD [place] Pager: [phone]

## 2015-12-28 ENCOUNTER — Ambulatory Visit: Payer: 59

## 2015-12-28 ENCOUNTER — Other Ambulatory Visit: Payer: Self-pay

## 2015-12-28 ENCOUNTER — Ambulatory Visit (INDEPENDENT_AMBULATORY_CARE_PROVIDER_SITE_OTHER): Payer: 59

## 2015-12-28 DIAGNOSIS — I739 Peripheral vascular disease, unspecified: Secondary | ICD-10-CM

## 2015-12-28 DIAGNOSIS — R0602 Shortness of breath: Secondary | ICD-10-CM

## 2015-12-28 DIAGNOSIS — R0789 Other chest pain: Secondary | ICD-10-CM

## 2015-12-28 DIAGNOSIS — I700 Atherosclerosis of aorta: Secondary | ICD-10-CM

## 2015-12-29 ENCOUNTER — Encounter: Payer: Self-pay | Admitting: Emergency Medicine

## 2015-12-29 ENCOUNTER — Emergency Department: Payer: 59

## 2015-12-29 ENCOUNTER — Telehealth: Payer: Self-pay | Admitting: Internal Medicine

## 2015-12-29 ENCOUNTER — Emergency Department
Admission: EM | Admit: 2015-12-29 | Discharge: 2015-12-29 | Disposition: A | Discharge status: Home / Self Care | Payer: 59 | Attending: Emergency Medicine | Admitting: Emergency Medicine

## 2015-12-29 DIAGNOSIS — Z87891 Personal history of nicotine dependence: Secondary | ICD-10-CM | POA: Insufficient documentation

## 2015-12-29 DIAGNOSIS — R0602 Shortness of breath: Secondary | ICD-10-CM | POA: Insufficient documentation

## 2015-12-29 DIAGNOSIS — Z79899 Other long term (current) drug therapy: Secondary | ICD-10-CM | POA: Insufficient documentation

## 2015-12-29 DIAGNOSIS — E119 Type 2 diabetes mellitus without complications: Secondary | ICD-10-CM | POA: Diagnosis not present

## 2015-12-29 DIAGNOSIS — Z7982 Long term (current) use of aspirin: Secondary | ICD-10-CM | POA: Diagnosis not present

## 2015-12-29 DIAGNOSIS — R0789 Other chest pain: Secondary | ICD-10-CM

## 2015-12-29 DIAGNOSIS — Z791 Long term (current) use of non-steroidal anti-inflammatories (NSAID): Secondary | ICD-10-CM | POA: Insufficient documentation

## 2015-12-29 DIAGNOSIS — R0609 Other forms of dyspnea: Secondary | ICD-10-CM

## 2015-12-29 DIAGNOSIS — R0600 Dyspnea, unspecified: Secondary | ICD-10-CM

## 2015-12-29 LAB — CBC
HEMATOCRIT: 38.3 % (ref 35.0–47.0)
HEMOGLOBIN: 13.7 g/dL (ref 12.0–16.0)
MCH: 31.4 pg (ref 26.0–34.0)
MCHC: 35.7 g/dL (ref 32.0–36.0)
MCV: 88.1 fL (ref 80.0–100.0)
Platelets: 177 10*3/uL (ref 150–440)
RBC: 4.35 MIL/uL (ref 3.80–5.20)
RDW: 13.8 % (ref 11.5–14.5)
WBC: 6.6 10*3/uL (ref 3.6–11.0)

## 2015-12-29 LAB — BASIC METABOLIC PANEL
ANION GAP: 6 (ref 5–15)
BUN: 11 mg/dL (ref 6–20)
CHLORIDE: 105 mmol/L (ref 101–111)
CO2: 25 mmol/L (ref 22–32)
Calcium: 9.3 mg/dL (ref 8.9–10.3)
Creatinine, Ser: 0.96 mg/dL (ref 0.44–1.00)
GFR calc non Af Amer: >60 mL/min (ref >60)
Glucose, Bld: 121 mg/dL — ABNORMAL HIGH (ref 65–99)
Potassium: 3.9 mmol/L (ref 3.5–5.1)
Sodium: 136 mmol/L (ref 135–145)

## 2015-12-29 LAB — FIBRIN DERIVATIVES D-DIMER (ARMC ONLY): Fibrin derivatives D-dimer (ARMC): 999 — ABNORMAL HIGH (ref 0–499)

## 2015-12-29 LAB — TROPONIN I: Troponin I: <0.03 ng/mL (ref <0.03)

## 2015-12-29 MED ORDER — IOPAMIDOL (ISOVUE-370) INJECTION 76%
75.0000 mL | Freq: Once | INTRAVENOUS | Status: AC | PRN
  Administered 2015-12-29: 75 mL via INTRAVENOUS
  Filled 2015-12-29: qty 75

## 2015-12-29 NOTE — Telephone Encounter (Signed)
Per Dr. Saunders Revel, he would like pt added on to his Wed, 10/4 @ 1:30pm. Pt still currently in ED. Will call pt to inform.

## 2015-12-29 NOTE — ED Notes (Signed)
MD at bedside. 

## 2015-12-29 NOTE — Telephone Encounter (Signed)
Given progressive nature of chest pain now present at rest, the patient should present to the nearest ED for further evaluation.

## 2015-12-29 NOTE — Telephone Encounter (Signed)
Pt reports chest pain at 11:30am yesterday stating it was more than usual. She took one nitro when minimal relief, followed by advil for her HA and went to sleep. When she awoke, she was chest pain free and states "this never happens" as she always has some sort of chest pain. She was pleased to be asymptomatic. She then pushed a large bag of dog food across the floor as she knew she should not lift it and chest pain returned.  Pt confirmed she is taking metoprolol 12.5mg  BID as prescribed by Dr. Saunders Revel. Pt was to reassess sx w/MD this week. Will forward to Dr. Saunders Revel to review and advise. Pt agreeable w/plan.

## 2015-12-29 NOTE — ED Provider Notes (Signed)
[facility] Emergency Department Provider Note ____________________________________________   I have reviewed the triage vital signs and the triage nursing note.  HISTORY  Chief Complaint Chest Pain   Historian Patient and husband  HPI Katelyn Cooper is a 60 y.o. female with a history of anxiety, depression, type 2 diabetes, hyperlipidemia, and acid reflux, is presenting today due to chest pressure for the past several weeks as well as dyspnea on exertion. She initially felt like her symptoms were central and right sided and felt lung related. She was highly anxious about the symptoms because her mom had lung cancer. She does have a history of smoking, but is currently quit. She saw Dr. Marchia Bond with pulmonology and had 2 CAT scans several months apart for a nodule which did not grow. She was referred to cardiology for which she is seen Dr. Saunders Revel, and had a equivocal or low risk nuclear medicine stress test, and then had an echocardiogram and peripheral vascular studies yesterday. She has had persistent symptoms for several weeks now, but had an episode last night and this morning which seemed a little bit worse and when she called the office they asked her to come to the ER for further evaluation.  She does report being under a lot of stress, and is set to retire at the end of the year.    Past Medical History:  Diagnosis Date  . Anxiety   . Depression   . Diabetes type 2, controlled ([place])    diet-controlled  . Esophageal stricture   . GERD (gastroesophageal reflux disease)   . History of chicken pox   . Hyperlipidemia   . Internal hemorrhoids   . Panic disorder     Patient Active Problem List   Diagnosis Date Noted  . Pleuritic chest pain 07/28/2015  . Duodenitis 01/05/2015  . Dermatitis, nummular 01/05/2015  . Allergic rhinitis 12/23/2014  . Anxiety 12/23/2014  . Ataxia 12/23/2014  . Back ache 12/23/2014  . Cervical muscle strain 12/23/2014  . Depression  12/23/2014  . Eczema 12/23/2014  . GERD (gastroesophageal reflux disease) 12/23/2014  . History of anemia 12/23/2014  . Pure hypercholesterolemia 12/23/2014  . Episodic mood disorder ([place]) 12/23/2014  . Nephropyelitis 12/23/2014  . Situational stress 12/23/2014  . Tremor 12/23/2014  . Vitamin D deficiency 11/03/2013  . Diabetes mellitus type 2, controlled ([place]) 08/13/2011  . Cough 05/10/2011  . Chest pain 05/10/2011  . Tobacco abuse 05/10/2011    Past Surgical History:  Procedure Laterality Date  . [place]   stent placement; ? left iliac artery intervention  . APPENDECTOMY  1998  . FOOT SURGERY     Right  . pap smear  03/2010   Done by [name], normal  . REFRACTIVE SURGERY     right  . RIB RESECTION  [id number]  . SHOULDER SURGERY  2005   left  . UPPER GI ENDOSCOPY  09/19/2011   Stricture at GE junction, dilated. Mild gastritis and duodenitis. Small hiatal hernia    Prior to Admission medications   Medication Sig Start Date End Date Taking? Authorizing Provider  albuterol (PROVENTIL HFA;VENTOLIN HFA) 108 (90 Base) MCG/ACT inhaler Inhale 2 puffs into the lungs every 6 (six) hours as needed for wheezing or shortness of breath. 07/28/15   Flora Lipps, MD  aspirin EC 81 MG tablet Take 1 tablet (81 mg total) by mouth daily. 12/12/15   Nelva Bush, MD  cholecalciferol (VITAMIN D) 1000 units tablet Take 1,000 Units  by mouth daily.    Historical Provider, MD  cyclobenzaprine (FLEXERIL) 5 MG tablet Take 5 mg by mouth 3 (three) times daily as needed for muscle spasms.    Historical Provider, MD  desoximetasone (TOPICORT) 0.25 % cream APPLY TOPICALLY IF NEEDED FOR 14 DAYS 07/18/15   Historical Provider, MD  glucose blood test strip 1 each by Other route as needed for other (ONE TOUCH ULTRA TEST STRIPS AND LANCETS). Use as instructed    Historical Provider, MD  ibuprofen (ADVIL) 200 MG tablet Take 200 mg by mouth as needed.    Historical Provider, MD   lansoprazole (PREVACID) 30 MG capsule Take 1 capsule (30 mg total) by mouth daily at 12 noon. 07/19/15   Rainey Pines, MD  metoprolol tartrate (LOPRESSOR) 25 MG tablet Take 0.5 tablets (12.5 mg total) by mouth 2 (two) times daily. 12/26/15 03/25/16  Nelva Bush, MD  nicotine (NICODERM CQ - DOSED IN MG/24 HOURS) 21 mg/24hr patch Place 1 patch (21 mg total) onto the skin daily. 09/01/15   Flora Lipps, MD  nitroGLYCERIN (NITROSTAT) 0.4 MG SL tablet Place 1 tablet (0.4 mg total) under the tongue every 5 (five) minutes as needed for chest pain. 12/12/15   Christopher End, MD  nystatin cream (MYCOSTATIN) APPLY TOPICALLY IF NEEDED FOR 14 DAYS 07/18/15   Historical Provider, MD  pravastatin (PRAVACHOL) 40 MG tablet Take 1 tablet by mouth  daily 09/27/15   [name], MD  QUEtiapine (SEROQUEL) 200 MG tablet Take 1 tablet (200 mg total) by mouth at bedtime. Patient taking differently: Take 300 mg by mouth at bedtime.  09/06/15   Rainey Pines, MD  QUEtiapine (SEROQUEL) 25 MG tablet Take 1 tablet (25 mg total) by mouth 2 (two) times daily. 07/19/15   Rainey Pines, MD  venlafaxine XR (EFFEXOR-XR) 75 MG 24 hr capsule Take 1 capsule (75 mg total) by mouth daily with breakfast. Pt has supply Patient taking differently: Take 75 mg by mouth 2 (two) times daily. Pt has supply 11/17/15   Rainey Pines, MD  zaleplon (SONATA) 5 MG capsule Take 1 capsule (5 mg total) by mouth at bedtime as needed for sleep. 11/17/15   Rainey Pines, MD    Allergies  Allergen Reactions  . Lovastatin     depression  . Paroxetine Hcl     itching  . Lamotrigine Rash    Family History  Problem Relation Age of Onset  . Lung cancer Mother     was a smoker  . Emphysema Mother   . Cancer Mother     Lung  . Alcohol abuse Mother   . Depression Mother   . Heart disease Mother   . Drug abuse Sister   . Breast cancer Sister   . Emphysema Sister   . Bipolar disorder Sister   . Alcohol abuse Other   . Depression Other   . Bipolar disorder  Other   . Heart disease Other   . Vision loss Other   . Alcohol abuse Father   . Mood Disorder Father   . Heart disease Maternal Grandmother   . Stroke Maternal Grandmother   . Esophageal cancer Maternal Aunt     Social History Social History  Substance Use Topics  . Smoking status: Former Smoker    Packs/day: 1.00    Years: 40.00    Types: Cigarettes, E-cigarettes    Start date: 03/01/1975    Quit date: 10/05/2015  . Smokeless tobacco: Never Used     Comment: quit  smoking cig 10/05/15 occ smoke ecig  . Alcohol use No    Review of Systems  Constitutional: Negative for fever. Eyes: Negative for visual changes. ENT: Negative for sore throat. Cardiovascular: Positive for chest pain. Respiratory: Positive for shortness of breath. Gastrointestinal: Negative for abdominal pain, vomiting and diarrhea. Genitourinary: Negative for dysuria. Musculoskeletal: Negative for back pain. Skin: Negative for rash. Neurological: Negative for headache. 10 point Review of Systems otherwise negative ____________________________________________   PHYSICAL EXAM:  VITAL SIGNS: ED Triage Vitals  Enc Vitals Group     BP 12/29/15 1110 115/69     Pulse Rate 12/29/15 1110 99     Resp 12/29/15 1110 17     Temp 12/29/15 1110 98.2 F (36.8 C)     Temp Source 12/29/15 1110 Oral     SpO2 12/29/15 1110 96 %     Weight 12/29/15 1111 153 lb (69.4 kg)     Height 12/29/15 1111 5\' 3"  (1.6 m)     Head Circumference --      Peak Flow --      Pain Score 12/29/15 1124 2     Pain Loc --      Pain Edu? --      Excl. in [place]? --      Constitutional: Alert and oriented. Well appearing and in no distress. HEENT   Head: Normocephalic and atraumatic.      Eyes: Conjunctivae are normal. PERRL. Normal extraocular movements.      Ears:         Nose: No congestion/rhinnorhea.   Mouth/Throat: Mucous membranes are moist.   Neck: No stridor. Cardiovascular/Chest: Normal rate, regular rhythm.  No  murmurs, rubs, or gallops. Respiratory: Normal respiratory effort without tachypnea nor retractions. Breath sounds are clear and equal bilaterally. No wheezes/rales/rhonchi. Gastrointestinal: Soft. No distention, no guarding, no rebound. Nontender.    Genitourinary/rectal:Deferred Musculoskeletal: Nontender with normal range of motion in all extremities. No joint effusions.  No lower extremity tenderness.  No edema. Neurologic:  Normal speech and language. No gross or focal neurologic deficits are appreciated. Skin:  Skin is warm, dry and intact. No rash noted. Psychiatric: Mood and affect are normal. Slightly anxious. Speech and behavior are normal. Patient exhibits appropriate insight and judgment.   ____________________________________________  LABS (pertinent positives/negatives)  Labs Reviewed  BASIC METABOLIC PANEL - Abnormal; Notable for the following:       Result Value   Glucose, Bld 121 (*)    All other components within normal limits  FIBRIN DERIVATIVES D-DIMER ([facility] ONLY) - Abnormal; Notable for the following:    Fibrin derivatives D-dimer [facility]) 999 (*)    All other components within normal limits  CBC  TROPONIN I    ____________________________________________    EKG I, Lisa Roca, MD, the attending physician have personally viewed and interpreted all ECGs.  100 bpm. Normal sinus rhythm. Narrow QRS. Normal axis. Normal ST and T-wave ____________________________________________  RADIOLOGY All Xrays were viewed by me. Imaging interpreted by Radiologist.  Chest x-ray two-view: FINDINGS: No infiltrate, congestive heart failure or pneumothorax.  CT detected tiny left fissural nodule not appreciated by plain film exam. Please see prior CT report. This may represent a subpleural lymph node.  Mild emphysematous changes better demonstrated on recent CT.  Calcified aorta.  Heart size within normal limits.  IMPRESSION: No acute pulmonary abnormality.   Please see above.  Aortic atherosclerosis.     CT chest with contrast for PE:   IMPRESSION: No CT findings for overt  pulmonary embolism.  Emphysematous changes but no acute pulmonary findings or worrisome pulmonary lesions.  No mediastinal or hilar mass or adenopathy.  __________________________________________  PROCEDURES  Procedure(s) performed: None  Critical Care performed: None  ____________________________________________   ED COURSE / ASSESSMENT AND PLAN  Pertinent labs & imaging results that were available during my care of the patient were reviewed by me and considered in my medical decision making (see chart for details).  Ms. Bru is here after being referred by cardiology over the phone for persistent/worsening waxing and waning chest pressure associated with shortness of breath. Her EKG is reassuring. I reviewed her recent workup including pulmonology for which she had a small nodule which did not change in size, but the CT was done without contrast several months ago. I reviewed her recent nuclear stress test which was considered low risk. Symptoms have been going on over 4 hours, and her troponin is negative. I'm not recommending repeat troponin given duration of symptoms. I did add on a d-dimer given the fact that she feels like her symptoms are pulmonary more than anything else, and it was elevated. Discussed with her that although she's had recent CT scans, these were done not with protocol for pulmonary embolism, and we chose to proceed.  Thankfully her CT scan showed no PE.  I do think that she is probably having a significant amount of anxiety concerning to her symptoms given her family history with the lung cancer, and paint under a lot of stress with retirement so near.  I did speak with on-call cardiologist Dr. Rockey Situ who reviewed her recent nuclear stress test, as well as studies from yesterday, and the presentation today with my description of  negative troponin and exam, EKG, and he recommends okay for outpatient follow-up.  Patient failure component with this plan of management.   CONSULTATIONS:   Cardiology by phone, Dr. Rockey Situ.   Patient / Family / Caregiver informed of clinical course, medical decision-making process, and agree with plan.   I discussed return precautions, follow-up instructions, and discharge instructions with patient and/or family.   ___________________________________________   FINAL CLINICAL IMPRESSION(S) / ED DIAGNOSES   Final diagnoses:  Chest tightness or pressure  Dyspnea on exertion              Note: This dictation was prepared with Dragon dictation. Any transcriptional errors that result from this process are unintentional    Lisa Roca, MD 12/29/15 [phone]

## 2015-12-29 NOTE — Telephone Encounter (Signed)
Pt calling stating she had some tests we ordered When she got home  She had some pains in her chest Took the pill under her tongue  And took a nap and felt a lot better Would just like [name] to be aware of this Is was very different for her

## 2015-12-29 NOTE — Discharge Instructions (Signed)
You were evaluated for chest discomfort and shortness of breath, and although no certain cause was found, your exam and evaluation are reassuring in the emergency department stay.  As we discussed, please call your cardiologist office tomorrow and make the next available appointment.  Return to the emergency room for any worsening symptoms including trouble breathing, fever, palpitations, dizziness or passing out, worsening pain, or any other symptoms concerning to you.

## 2015-12-29 NOTE — ED Triage Notes (Signed)
For the last week having mid-sternal chest for a few months , on Friday was seen by her cardiologist , and then again yesterday.   After her appointment, at home has an episode and took a nitro and then a nap and awoke up with no pain at all, she contacted her cardiologist today and was encouraged to come her to obtain blood work

## 2015-12-29 NOTE — Telephone Encounter (Signed)
S/w pt who is agreeable to proceed to the ER for further evaluation. States she is at work but will "close everything up" and go to [facility] now. Notified Raquel Sarna @ ER nurses station.

## 2015-12-30 ENCOUNTER — Telehealth: Payer: Self-pay | Admitting: Internal Medicine

## 2015-12-30 ENCOUNTER — Encounter: Payer: Self-pay | Admitting: *Deleted

## 2015-12-30 MED ORDER — ISOSORBIDE MONONITRATE ER 30 MG PO TB24: 30.0000 mg | ORAL_TABLET | Freq: Every day | ORAL | 11 refills | Status: DC

## 2015-12-30 NOTE — Telephone Encounter (Signed)
I spoke with Katelyn Cooper to follow-up regarding her chest pain and ED visit yesterday.  We also reviewed the results of her echo and LE vascular studies.  She continues to have very mild intermittent chest pain.  ED evaluation yesterday, including EKG, Tn, and CTA chest were unremarkable with the exception of coronary artery calcification noted on CTA.  Since her pain responded to SL NTG earlier in the week, we will start isosorbide mononitrate 30 mg daily and continue with metoprolol.  She will follow-up with me in clinic next week (10/4) to reevaluate her symptoms and discuss proceeding with coronary angiography.  She was counseled to seek immediate medical attention if her pain worsens in the meantime.  Nelva Bush, MD [facility] Pager: [phone]

## 2015-12-30 NOTE — Telephone Encounter (Signed)
Spoke with patient to see how she was feeling and if she had any questions. Confirmed her appointment and reviewed new medication that she was started on. She was very appreciative for the call and had no further questions at this time.

## 2016-01-04 ENCOUNTER — Encounter: Payer: Self-pay | Admitting: Internal Medicine

## 2016-01-04 ENCOUNTER — Ambulatory Visit (INDEPENDENT_AMBULATORY_CARE_PROVIDER_SITE_OTHER): Payer: 59 | Admitting: Internal Medicine

## 2016-01-04 VITALS — BP 110/58 | HR 94 | Ht 63.0 in | Wt 162.2 lb

## 2016-01-04 DIAGNOSIS — I2510 Atherosclerotic heart disease of native coronary artery without angina pectoris: Secondary | ICD-10-CM | POA: Diagnosis not present

## 2016-01-04 DIAGNOSIS — I739 Peripheral vascular disease, unspecified: Secondary | ICD-10-CM

## 2016-01-04 DIAGNOSIS — R079 Chest pain, unspecified: Secondary | ICD-10-CM

## 2016-01-04 MED ORDER — ATORVASTATIN CALCIUM 40 MG PO TABS: 40.0000 mg | ORAL_TABLET | Freq: Every day | ORAL | 11 refills | Status: DC

## 2016-01-04 MED ORDER — METOPROLOL TARTRATE 25 MG PO TABS
25.0000 mg | ORAL_TABLET | Freq: Two times a day (BID) | ORAL | 5 refills | Status: DC
Start: 2016-01-04 — End: 2016-01-12

## 2016-01-04 NOTE — Progress Notes (Signed)
 Follow-up Outpatient Visit Date: 01/04/2016  Chief Complaint:  Chief Complaint  Patient presents with  . other    Pt would like to discuss new medications c/o headaches. Meds reviewed verbally with pt.    HPI:  Katelyn Cooper is a 60 y.o. year-old female with history of diet controlled diabetes mellitus, PVD with prior left iliac stent in the 1990s, thoracic outlet syndrome status post bilateral rib resections in 1990, GERD complicated by esophageal stricture, hyperlipidemia, and anxiety/depression, who presents for follow-up of chest pain with abnormal stress test. I first met the patient on 12/12/15 for evaluation of chest tightness, dyspnea, and fatigue. Subsequent workup revealed normal LVEF by echo and myocardial perfusion stress test with a small reversible apical and apical anterior defect. Patient was started on low-dose metoprolol without improvement. She subtotally came to the ER due to chest pain at rest with negative troponin times one and CTA chest demonstrating coronary artery calcification but no PE. We decided to add isosorbide mononitrate 30 mg daily, which has led to significant improvement. The patient has not had any further episodes of chest pain since adding into her. Overall, her energy has also improved with less exertional dyspnea. She has a mild headache that improves but does not resolve with ibuprofen. She denies leg edema and orthopnea. She is in the process of trying to stop smoking but occasionally will have a cigarette. She is due for a sleep study to evaluate for obstructive sleep apnea later this week.  --------------------------------------------------------------------------------------------------  Cardiovascular History & Procedures: Cardiovascular Problems:  Coronary artery disease with abnormal stress test (12/2015)  Risk Factors:  Diabetes mellitus, hyperlipidemia, peripheral vascular disease  Cath/PCI:  None.  CV Surgery:  None.  EP Procedures and  Devices:  None.  Non-Invasive Evaluation(s):  Pharmacologic myocardial perfusion stress test (12/23/15): Small, mild area of reversible defect involving the apical anterior and apical segments that could reflect ischemia or breast attenuation. LVEF 55-65%. Borderline 3 times a day was noted.  Transthoracic echocardiogram (12/28/15): Normal LV systolic and diastolic function (EF 55-60%). No significant valvular abnormalities. Normal RV size and function.  Aortoiliac and lower extremity Dopplers: Pain stent in the left common iliac artery. No significant abnormalities. ABI right 1.05, left 1.03.  Recent CV Pertinent Labs: Lab Results  Component Value Date   K 3.9 12/29/2015   BUN 11 12/29/2015   BUN 9 08/10/2015   CREATININE 0.96 12/29/2015     Past medical and surgical history were reviewed and updated in EPIC.   Outpatient Encounter Prescriptions as of 01/04/2016  Medication Sig  . albuterol (PROVENTIL HFA;VENTOLIN HFA) 108 (90 Base) MCG/ACT inhaler Inhale 2 puffs into the lungs every 6 (six) hours as needed for wheezing or shortness of breath.  . aspirin EC 81 MG tablet Take 1 tablet (81 mg total) by mouth daily.  . cholecalciferol (VITAMIN D) 1000 units tablet Take 1,000 Units by mouth daily.  . cyclobenzaprine (FLEXERIL) 5 MG tablet Take 5 mg by mouth 3 (three) times daily as needed for muscle spasms.  . desoximetasone (TOPICORT) 0.25 % cream APPLY TOPICALLY IF NEEDED FOR 14 DAYS  . glucose blood test strip 1 each by Other route as needed for other (ONE TOUCH ULTRA TEST STRIPS AND LANCETS). Use as instructed  . ibuprofen (ADVIL) 200 MG tablet Take 200 mg by mouth as needed.  . isosorbide mononitrate (IMDUR) 30 MG 24 hr tablet Take 1 tablet (30 mg total) by mouth daily.  . lansoprazole (PREVACID) 30 MG capsule Take   1 capsule (30 mg total) by mouth daily at 12 noon.  . metoprolol tartrate (LOPRESSOR) 25 MG tablet Take 0.5 tablets (12.5 mg total) by mouth 2 (two) times daily.  .  nicotine (NICODERM CQ - DOSED IN MG/24 HOURS) 21 mg/24hr patch Place 1 patch (21 mg total) onto the skin daily.  . nitroGLYCERIN (NITROSTAT) 0.4 MG SL tablet Place 1 tablet (0.4 mg total) under the tongue every 5 (five) minutes as needed for chest pain.  . nystatin cream (MYCOSTATIN) APPLY TOPICALLY IF NEEDED FOR 14 DAYS  . pravastatin (PRAVACHOL) 40 MG tablet Take 1 tablet by mouth  daily  . QUEtiapine (SEROQUEL) 200 MG tablet Take 1 tablet (200 mg total) by mouth at bedtime. (Patient taking differently: Take 300 mg by mouth at bedtime. )  . QUEtiapine (SEROQUEL) 25 MG tablet Take 1 tablet (25 mg total) by mouth 2 (two) times daily.  . venlafaxine XR (EFFEXOR-XR) 75 MG 24 hr capsule Take 1 capsule (75 mg total) by mouth daily with breakfast. Pt has supply (Patient taking differently: Take 75 mg by mouth 2 (two) times daily. Pt has supply)  . zaleplon (SONATA) 5 MG capsule Take 1 capsule (5 mg total) by mouth at bedtime as needed for sleep.   No facility-administered encounter medications on file as of 01/04/2016.     Allergies: Lovastatin; Paroxetine hcl; and Lamotrigine  Social History   Social History  . Marital status: Married    Spouse name: N/A  . Number of children: 0  . Years of education: N/A   Occupational History  . [employer]   Social History Main Topics  . Smoking status: Former Smoker    Packs/day: 1.00    Years: 40.00    Types: Cigarettes, E-cigarettes    Start date: 03/01/1975    Quit date: 10/05/2015  . Smokeless tobacco: Never Used     Comment: quit smoking cig 10/05/15 occ smoke ecig  . Alcohol use No  . Drug use: No  . Sexual activity: Not Currently   Other Topics Concern  . Not on file   Social History Narrative  . No narrative on file    Family History  Problem Relation Age of Onset  . Lung cancer Mother     was a smoker  . Emphysema Mother   . Cancer Mother     Lung  . Alcohol abuse Mother   . Depression Mother   . Heart disease Mother     . Drug abuse Sister   . Breast cancer Sister   . Emphysema Sister   . Bipolar disorder Sister   . Alcohol abuse Other   . Depression Other   . Bipolar disorder Other   . Heart disease Other   . Vision loss Other   . Alcohol abuse Father   . Mood Disorder Father   . Heart disease Maternal Grandmother   . Stroke Maternal Grandmother   . Esophageal cancer Maternal Aunt     Review of Systems: A 12-system review of systems was performed and was negative except as noted in the HPI.  --------------------------------------------------------------------------------------------------  Physical Exam: BP (!) 110/58 (BP Location: Left Arm, Patient Position: Sitting, Cuff Size: Normal)   Pulse 94   Ht 5' 3" (1.6 m)   Wt 162 lb 4 oz (73.6 kg)   BMI 28.74 kg/m   General:  Well-developed, well nourished woman, seated comfortably in the exam room. HEENT: No conjunctival pallor or scleral icterus.  Moist mucous membranes.    OP clear. Neck: Supple without lymphadenopathy, thyromegaly, JVD, or HJR.  No carotid bruit. Lungs: Normal work of breathing.  Clear to auscultation bilaterally without wheezes or crackles. CV: Regular rate and rhythm without murmurs, rubs, or gallops.  Non-displaced PMI. Abd: Bowel sounds present.  Soft, NT/ND without hepatosplenomegaly Ext: No lower extremity edema.  Radial, PT, and DP pulses are 2+ bilaterally. Skin: warm and dry without rash  EKG:  Normal sinus rhythm.  No significant abnormalities.  Lab Results  Component Value Date   WBC 6.6 12/29/2015   HGB 13.7 12/29/2015   HCT 38.3 12/29/2015   MCV 88.1 12/29/2015   PLT 177 12/29/2015    Lab Results  Component Value Date   NA 136 12/29/2015   K 3.9 12/29/2015   CL 105 12/29/2015   CO2 25 12/29/2015   BUN 11 12/29/2015   CREATININE 0.96 12/29/2015   GLUCOSE 121 (H) 12/29/2015   ALT 16 12/10/2013    Lipids: Recently performed at health screen at workplace; patient will fax results to  us.  --------------------------------------------------------------------------------------------------  ASSESSMENT AND PLAN: 1. Chest pain and coronary artery disease The patient reports marked improvement in her exertional chest pain and fatigue since starting isosorbide mononitrate. Fact, she has not had any further episodes of chest pressure. Her EKG today remains normal with incomplete beta blockade. We have agreed to increase metoprolol to 25 mg twice a day and continue with her current dose of isosorbide mononitrate. If her headache becomes more problematic, we could consider decreasing isosorbide mononitrate to 15 mg daily. The patient should remain on low-dose aspirin. Given her diabetes and CAD based on abnormal albeit low risk stress test and coronary artery calcifications on chest CTA, we have agreed to escalate her statin therapy to a high-intensity agent. The patient will fax her recent lipid panel to our office so that we have a baseline. The patient was advised to contact us if her symptoms worsen so that we can make arrangements for coronary angiography.  2. Peripheral vascular disease (HCC) Recent aortoiliac and bilateral lower extremity arterial studies show patent stent in the left common iliac artery without significant stenoses. ABIs are normal as well. I suspect her left hip pain is not due to vascular insufficiency. We will continue with risk factor modification.  Follow-up: Return to clinic in 3 months, sooner if chest pain recurs.  Makennah Omura, MD 01/04/2016 1:52 PM 

## 2016-01-04 NOTE — Patient Instructions (Signed)
Medication Instructions: - Your physician has recommended you make the following change in your medication: 1) Increase metoprolol tartrate to 25 mg one tablet by mouth twice daily  Labwork: - none ordered  Procedures/Testing: - none ordered  Follow-Up: - Your physician recommends that you schedule a follow-up appointment in: 3 months with Dr. Saunders Revel.  Any Additional Special Instructions Will Be Listed Below (If Applicable).     If you need a refill on your cardiac medications before your next appointment, please call your pharmacy.

## 2016-01-05 ENCOUNTER — Encounter: Payer: Self-pay | Admitting: *Deleted

## 2016-01-05 ENCOUNTER — Telehealth: Payer: Self-pay | Admitting: Internal Medicine

## 2016-01-05 DIAGNOSIS — Z01812 Encounter for preprocedural laboratory examination: Secondary | ICD-10-CM

## 2016-01-05 NOTE — Telephone Encounter (Signed)
Spoke with patient and reviewed with her date, time, location, instructions, and pre procedure labs that we need her to have done. Printed lab slips and letter with instructions for catheterization to include location. Patient verbalized understanding of these instructions and also made her aware that Dr. Saunders Revel may also call her as well. She verbalized understanding of our conversation, agreement with plan of care, and had no further questions at this time. Will place her lab slips and letter up front for her to pick up tomorrow.

## 2016-01-05 NOTE — Telephone Encounter (Addendum)
Spoke with Santiago Glad at [facility] Cath lab and scheduled left heart catheterization for Thursday January 12, 2016 at 09:00AM. Patient to arrive at [facility] at 07:00AM. Ordered pre-procedure labs and

## 2016-01-05 NOTE — Telephone Encounter (Signed)
Spoke with patient and she states that the medication is helping but that after discussing with her husband they felt this was just a band aide on the issue and they really need to find out what is going on. So she wants to move forward with having a catheterization in [place]. The only date that doesn't work for her would be 01/27/16 because she is having a colonoscopy on that day. Let her know that I would coordinate this procedure and be in touch with her regarding instructions and appointment information. She verbalized understanding and had no further questions at this time.

## 2016-01-05 NOTE — Telephone Encounter (Signed)
That sounds fine.  Please let me know if she has any questions or concerns and we can discuss them by phone.  Thanks for your help.

## 2016-01-05 NOTE — Telephone Encounter (Signed)
Pt states she is ready to schedule her cath. Please call. 

## 2016-01-05 NOTE — Telephone Encounter (Signed)
I spoke with the patient this afternoon and confirmed that we will proceed with LHC and possible PCI next week at [facility].  She will contact us if any questions or concerns arise in the meantime.

## 2016-01-06 ENCOUNTER — Other Ambulatory Visit
Admission: RE | Admit: 2016-01-06 | Discharge: 2016-01-06 | Disposition: A | Discharge status: Home / Self Care | Payer: 59 | Source: Ambulatory Visit | Attending: Internal Medicine | Admitting: Internal Medicine

## 2016-01-06 DIAGNOSIS — Z01812 Encounter for preprocedural laboratory examination: Secondary | ICD-10-CM | POA: Insufficient documentation

## 2016-01-06 LAB — CBC WITH DIFFERENTIAL/PLATELET
Basophils Absolute: 0.1 10*3/uL (ref 0–0.1)
Basophils Relative: 1 %
EOS PCT: 6 %
Eosinophils Absolute: 0.4 10*3/uL (ref 0–0.7)
HCT: 36.1 % (ref 35.0–47.0)
Hemoglobin: 12.5 g/dL (ref 12.0–16.0)
LYMPHS ABS: 2 10*3/uL (ref 1.0–3.6)
LYMPHS PCT: 30 %
MCH: 30.5 pg (ref 26.0–34.0)
MCHC: 34.5 g/dL (ref 32.0–36.0)
MCV: 88.5 fL (ref 80.0–100.0)
MONO ABS: 0.4 10*3/uL (ref 0.2–0.9)
MONOS PCT: 5 %
Neutro Abs: 3.8 10*3/uL (ref 1.4–6.5)
Neutrophils Relative %: 58 %
PLATELETS: 174 10*3/uL (ref 150–440)
RBC: 4.08 MIL/uL (ref 3.80–5.20)
RDW: 13.5 % (ref 11.5–14.5)
WBC: 6.7 10*3/uL (ref 3.6–11.0)

## 2016-01-06 LAB — BASIC METABOLIC PANEL
ANION GAP: 6 (ref 5–15)
BUN: 14 mg/dL (ref 6–20)
CALCIUM: 9.2 mg/dL (ref 8.9–10.3)
CO2: 27 mmol/L (ref 22–32)
Chloride: 104 mmol/L (ref 101–111)
Creatinine, Ser: 1.13 mg/dL — ABNORMAL HIGH (ref 0.44–1.00)
GFR, EST AFRICAN AMERICAN: 60 mL/min — AB (ref >60)
GFR, EST NON AFRICAN AMERICAN: 51 mL/min — AB (ref >60)
Glucose, Bld: 138 mg/dL — ABNORMAL HIGH (ref 65–99)
POTASSIUM: 4.4 mmol/L (ref 3.5–5.1)
SODIUM: 137 mmol/L (ref 135–145)

## 2016-01-06 LAB — PROTIME-INR
INR: 1.06
Prothrombin Time: 13.8 seconds (ref 11.4–15.2)

## 2016-01-07 DIAGNOSIS — G4733 Obstructive sleep apnea (adult) (pediatric): Secondary | ICD-10-CM | POA: Diagnosis not present

## 2016-01-09 DIAGNOSIS — G4733 Obstructive sleep apnea (adult) (pediatric): Secondary | ICD-10-CM | POA: Diagnosis not present

## 2016-01-10 ENCOUNTER — Ambulatory Visit: Payer: 59 | Admitting: Internal Medicine

## 2016-01-10 ENCOUNTER — Telehealth: Payer: Self-pay | Admitting: Internal Medicine

## 2016-01-10 ENCOUNTER — Other Ambulatory Visit: Payer: Self-pay | Admitting: *Deleted

## 2016-01-10 DIAGNOSIS — G4719 Other hypersomnia: Secondary | ICD-10-CM

## 2016-01-10 DIAGNOSIS — R0683 Snoring: Secondary | ICD-10-CM

## 2016-01-10 NOTE — Telephone Encounter (Signed)
Reviewed instructions for upcoming catheterization and address. She had forgotten that the letter with instructions was in the envelope that she had picked up. Instructed her to call back if she has any further questions and she verbalized understanding.

## 2016-01-10 NOTE — Telephone Encounter (Signed)
Pt calling asking if we can call her back She has a procedure coming up and she just needs the address

## 2016-01-11 ENCOUNTER — Ambulatory Visit (AMBULATORY_SURGERY_CENTER): Payer: Self-pay

## 2016-01-11 VITALS — Ht 63.0 in | Wt 162.4 lb

## 2016-01-11 DIAGNOSIS — Z1211 Encounter for screening for malignant neoplasm of colon: Secondary | ICD-10-CM

## 2016-01-11 MED ORDER — SUPREP BOWEL PREP KIT 17.5-3.13-1.6 GM/177ML PO SOLN: 1.0000 | Freq: Once | ORAL | 0 refills | Status: AC

## 2016-01-11 NOTE — Progress Notes (Signed)
No allergies to eggs or soy No diet meds No home oxygen No past problems with anesthesia  Declined emmi "I've had one before and my husband has to get them every few years"

## 2016-01-12 ENCOUNTER — Encounter (HOSPITAL_COMMUNITY)
Admission: RE | Disposition: A | Discharge status: Home / Self Care | Payer: Self-pay | Source: Ambulatory Visit | Attending: Internal Medicine

## 2016-01-12 ENCOUNTER — Ambulatory Visit (HOSPITAL_COMMUNITY)
Admission: RE | Admit: 2016-01-12 | Discharge: 2016-01-12 | Disposition: A | Discharge status: Home / Self Care | Payer: 59 | Source: Ambulatory Visit | Attending: Internal Medicine | Admitting: Internal Medicine

## 2016-01-12 ENCOUNTER — Encounter (HOSPITAL_COMMUNITY): Payer: Self-pay | Admitting: Internal Medicine

## 2016-01-12 DIAGNOSIS — Z823 Family history of stroke: Secondary | ICD-10-CM | POA: Diagnosis not present

## 2016-01-12 DIAGNOSIS — F329 Major depressive disorder, single episode, unspecified: Secondary | ICD-10-CM | POA: Diagnosis not present

## 2016-01-12 DIAGNOSIS — K219 Gastro-esophageal reflux disease without esophagitis: Secondary | ICD-10-CM | POA: Diagnosis not present

## 2016-01-12 DIAGNOSIS — Z7982 Long term (current) use of aspirin: Secondary | ICD-10-CM | POA: Insufficient documentation

## 2016-01-12 DIAGNOSIS — R079 Chest pain, unspecified: Secondary | ICD-10-CM | POA: Diagnosis not present

## 2016-01-12 DIAGNOSIS — I2510 Atherosclerotic heart disease of native coronary artery without angina pectoris: Secondary | ICD-10-CM

## 2016-01-12 DIAGNOSIS — E785 Hyperlipidemia, unspecified: Secondary | ICD-10-CM | POA: Diagnosis not present

## 2016-01-12 DIAGNOSIS — K222 Esophageal obstruction: Secondary | ICD-10-CM | POA: Insufficient documentation

## 2016-01-12 DIAGNOSIS — F17290 Nicotine dependence, other tobacco product, uncomplicated: Secondary | ICD-10-CM | POA: Insufficient documentation

## 2016-01-12 DIAGNOSIS — Z801 Family history of malignant neoplasm of trachea, bronchus and lung: Secondary | ICD-10-CM | POA: Diagnosis not present

## 2016-01-12 DIAGNOSIS — F419 Anxiety disorder, unspecified: Secondary | ICD-10-CM | POA: Diagnosis not present

## 2016-01-12 DIAGNOSIS — Z95820 Peripheral vascular angioplasty status with implants and grafts: Secondary | ICD-10-CM | POA: Insufficient documentation

## 2016-01-12 DIAGNOSIS — E1151 Type 2 diabetes mellitus with diabetic peripheral angiopathy without gangrene: Secondary | ICD-10-CM | POA: Insufficient documentation

## 2016-01-12 DIAGNOSIS — Z8249 Family history of ischemic heart disease and other diseases of the circulatory system: Secondary | ICD-10-CM | POA: Insufficient documentation

## 2016-01-12 DIAGNOSIS — R9439 Abnormal result of other cardiovascular function study: Secondary | ICD-10-CM | POA: Diagnosis present

## 2016-01-12 DIAGNOSIS — Z803 Family history of malignant neoplasm of breast: Secondary | ICD-10-CM | POA: Diagnosis not present

## 2016-01-12 HISTORY — PX: CARDIAC CATHETERIZATION: SHX172

## 2016-01-12 SURGERY — LEFT HEART CATH AND CORONARY ANGIOGRAPHY

## 2016-01-12 MED ORDER — VERAPAMIL HCL 2.5 MG/ML IV SOLN
INTRAVENOUS | Status: DC | PRN
  Administered 2016-01-12: 10 mL via INTRA_ARTERIAL

## 2016-01-12 MED ORDER — SODIUM CHLORIDE 0.9% FLUSH: 3.0000 mL | INTRAVENOUS | Status: DC | PRN

## 2016-01-12 MED ORDER — SODIUM CHLORIDE 0.9 % WEIGHT BASED INFUSION
3.0000 mL/kg/h | INTRAVENOUS | Status: AC
Start: 2016-01-12 — End: 2016-01-12
  Administered 2016-01-12: 3 mL/kg/h via INTRAVENOUS

## 2016-01-12 MED ORDER — IOPAMIDOL (ISOVUE-370) INJECTION 76%
INTRAVENOUS | Status: DC | PRN
  Administered 2016-01-12: 40 mL via INTRA_ARTERIAL

## 2016-01-12 MED ORDER — SODIUM CHLORIDE 0.9 % IV SOLN: 250.0000 mL | INTRAVENOUS | Status: DC | PRN

## 2016-01-12 MED ORDER — HEPARIN SODIUM (PORCINE) 1000 UNIT/ML IJ SOLN
INTRAMUSCULAR | Status: AC
  Filled 2016-01-12: qty 1

## 2016-01-12 MED ORDER — METOPROLOL TARTRATE 25 MG PO TABS: 50.0000 mg | ORAL_TABLET | Freq: Two times a day (BID) | ORAL | 5 refills | Status: DC

## 2016-01-12 MED ORDER — MIDAZOLAM HCL 2 MG/2ML IJ SOLN
INTRAMUSCULAR | Status: DC | PRN
  Administered 2016-01-12: 1 mg via INTRAVENOUS

## 2016-01-12 MED ORDER — SODIUM CHLORIDE 0.9 % WEIGHT BASED INFUSION: 1.0000 mL/kg/h | INTRAVENOUS | Status: DC

## 2016-01-12 MED ORDER — HEPARIN SODIUM (PORCINE) 1000 UNIT/ML IJ SOLN
INTRAMUSCULAR | Status: DC | PRN
  Administered 2016-01-12: 3500 [IU] via INTRAVENOUS

## 2016-01-12 MED ORDER — SODIUM CHLORIDE 0.9% FLUSH: 3.0000 mL | Freq: Two times a day (BID) | INTRAVENOUS | Status: DC

## 2016-01-12 MED ORDER — VERAPAMIL HCL 2.5 MG/ML IV SOLN
INTRAVENOUS | Status: AC
Start: 2016-01-12 — End: 2016-01-12
  Filled 2016-01-12: qty 2

## 2016-01-12 MED ORDER — SODIUM CHLORIDE 0.9 % IV SOLN: INTRAVENOUS | Status: DC

## 2016-01-12 MED ORDER — IOPAMIDOL (ISOVUE-370) INJECTION 76%
INTRAVENOUS | Status: AC
  Filled 2016-01-12: qty 100

## 2016-01-12 MED ORDER — ASPIRIN 81 MG PO CHEW: 81.0000 mg | CHEWABLE_TABLET | ORAL | Status: DC

## 2016-01-12 MED ORDER — HEPARIN (PORCINE) IN NACL 2-0.9 UNIT/ML-% IJ SOLN
INTRAMUSCULAR | Status: DC | PRN
  Administered 2016-01-12: 500 mL

## 2016-01-12 MED ORDER — LIDOCAINE HCL (PF) 1 % IJ SOLN
INTRAMUSCULAR | Status: AC
  Filled 2016-01-12: qty 30

## 2016-01-12 MED ORDER — FENTANYL CITRATE (PF) 100 MCG/2ML IJ SOLN
INTRAMUSCULAR | Status: AC
  Filled 2016-01-12: qty 2

## 2016-01-12 MED ORDER — HEPARIN (PORCINE) IN NACL 2-0.9 UNIT/ML-% IJ SOLN
INTRAMUSCULAR | Status: AC
  Filled 2016-01-12: qty 500

## 2016-01-12 MED ORDER — FENTANYL CITRATE (PF) 100 MCG/2ML IJ SOLN
INTRAMUSCULAR | Status: DC | PRN
  Administered 2016-01-12: 50 ug via INTRAVENOUS

## 2016-01-12 MED ORDER — MIDAZOLAM HCL 2 MG/2ML IJ SOLN
INTRAMUSCULAR | Status: AC
  Filled 2016-01-12: qty 2

## 2016-01-12 MED ORDER — LIDOCAINE HCL (PF) 1 % IJ SOLN
INTRAMUSCULAR | Status: DC | PRN
  Administered 2016-01-12: 1 mL

## 2016-01-12 SURGICAL SUPPLY — 9 items
CATH 5FR JL3.5 JR4 ANG PIG MP (CATHETERS) ×1 IMPLANT
DEVICE RAD COMP TR BAND LRG (VASCULAR PRODUCTS) ×1 IMPLANT
GLIDESHEATH SLEND SS 6F .021 (SHEATH) ×1 IMPLANT
KIT HEART LEFT (KITS) ×2 IMPLANT
PACK CARDIAC CATHETERIZATION (CUSTOM PROCEDURE TRAY) ×2 IMPLANT
TRANSDUCER W/STOPCOCK (MISCELLANEOUS) ×2 IMPLANT
TUBING CIL FLEX 10 FLL-RA (TUBING) ×2 IMPLANT
WIRE HI TORQ VERSACORE-J 145CM (WIRE) ×1 IMPLANT
WIRE SAFE-T 1.5MM-J .035X260CM (WIRE) ×1 IMPLANT

## 2016-01-12 NOTE — H&P (View-Only) (Signed)
Follow-up Outpatient Visit Date: 01/04/2016  Chief Complaint:  Chief Complaint  Patient presents with  . other    Pt would like to discuss new medications c/o headaches. Meds reviewed verbally with pt.    HPI:  Katelyn Cooper is a 61 y.o. year-old female with history of diet controlled diabetes mellitus, PVD with prior left iliac stent in the 1990s, thoracic outlet syndrome status post bilateral rib resections in 7106, GERD complicated by esophageal stricture, hyperlipidemia, and anxiety/depression, who presents for follow-up of chest pain with abnormal stress test. I first met the patient on 12/12/15 for evaluation of chest tightness, dyspnea, and fatigue. Subsequent workup revealed normal LVEF by echo and myocardial perfusion stress test with a small reversible apical and apical anterior defect. Patient was started on low-dose metoprolol without improvement. She subtotally came to the ER due to chest pain at rest with negative troponin times one and CTA chest demonstrating coronary artery calcification but no PE. We decided to add isosorbide mononitrate 30 mg daily, which has led to significant improvement. The patient has not had any further episodes of chest pain since adding into her. Overall, her energy has also improved with less exertional dyspnea. She has a mild headache that improves but does not resolve with ibuprofen. She denies leg edema and orthopnea. She is in the process of trying to stop smoking but occasionally will have a cigarette. She is due for a sleep study to evaluate for obstructive sleep apnea later this week.  --------------------------------------------------------------------------------------------------  Cardiovascular History & Procedures: Cardiovascular Problems:  Coronary artery disease with abnormal stress test (12/2015)  Risk Factors:  Diabetes mellitus, hyperlipidemia, peripheral vascular disease  Cath/PCI:  None.  CV Surgery:  None.  EP Procedures and  Devices:  None.  Non-Invasive Evaluation(s):  Pharmacologic myocardial perfusion stress test (12/23/15): Small, mild area of reversible defect involving the apical anterior and apical segments that could reflect ischemia or breast attenuation. LVEF 55-65%. Borderline 3 times a day was noted.  Transthoracic echocardiogram (12/28/15): Normal LV systolic and diastolic function (EF 26-94%). No significant valvular abnormalities. Normal RV size and function.  Aortoiliac and lower extremity Dopplers: Pain stent in the left common iliac artery. No significant abnormalities. ABI right 1.05, left 1.03.  Recent CV Pertinent Labs: Lab Results  Component Value Date   K 3.9 12/29/2015   BUN 11 12/29/2015   BUN 9 08/10/2015   CREATININE 0.96 12/29/2015     Past medical and surgical history were reviewed and updated in EPIC.   Outpatient Encounter Prescriptions as of 01/04/2016  Medication Sig  . albuterol (PROVENTIL HFA;VENTOLIN HFA) 108 (90 Base) MCG/ACT inhaler Inhale 2 puffs into the lungs every 6 (six) hours as needed for wheezing or shortness of breath.  [name] aspirin EC 81 MG tablet Take 1 tablet (81 mg total) by mouth daily.  . cholecalciferol (VITAMIN D) 1000 units tablet Take 1,000 Units by mouth daily.  . cyclobenzaprine (FLEXERIL) 5 MG tablet Take 5 mg by mouth 3 (three) times daily as needed for muscle spasms.  [name] desoximetasone (TOPICORT) 0.25 % cream APPLY TOPICALLY IF NEEDED FOR 14 DAYS  . glucose blood test strip 1 each by Other route as needed for other (ONE TOUCH ULTRA TEST STRIPS AND LANCETS). Use as instructed  . ibuprofen (ADVIL) 200 MG tablet Take 200 mg by mouth as needed.  . isosorbide mononitrate (IMDUR) 30 MG 24 hr tablet Take 1 tablet (30 mg total) by mouth daily.  . lansoprazole (PREVACID) 30 MG capsule Take  1 capsule (30 mg total) by mouth daily at 12 noon.  . metoprolol tartrate (LOPRESSOR) 25 MG tablet Take 0.5 tablets (12.5 mg total) by mouth 2 (two) times daily.  .  nicotine (NICODERM CQ - DOSED IN MG/24 HOURS) 21 mg/24hr patch Place 1 patch (21 mg total) onto the skin daily.  . nitroGLYCERIN (NITROSTAT) 0.4 MG SL tablet Place 1 tablet (0.4 mg total) under the tongue every 5 (five) minutes as needed for chest pain.  [name] nystatin cream (MYCOSTATIN) APPLY TOPICALLY IF NEEDED FOR 14 DAYS  . pravastatin (PRAVACHOL) 40 MG tablet Take 1 tablet by mouth  daily  . QUEtiapine (SEROQUEL) 200 MG tablet Take 1 tablet (200 mg total) by mouth at bedtime. (Patient taking differently: Take 300 mg by mouth at bedtime. )  . QUEtiapine (SEROQUEL) 25 MG tablet Take 1 tablet (25 mg total) by mouth 2 (two) times daily.  [name] venlafaxine XR (EFFEXOR-XR) 75 MG 24 hr capsule Take 1 capsule (75 mg total) by mouth daily with breakfast. Pt has supply (Patient taking differently: Take 75 mg by mouth 2 (two) times daily. Pt has supply)  . zaleplon (SONATA) 5 MG capsule Take 1 capsule (5 mg total) by mouth at bedtime as needed for sleep.   No facility-administered encounter medications on file as of 01/04/2016.     Allergies: Lovastatin; Paroxetine hcl; and Lamotrigine  Social History   Social History  . Marital status: Married    Spouse name: N/A  . Number of children: 0  . Years of education: N/A   Occupational History  . computer [place] History Main Topics  . Smoking status: Former Smoker    Packs/day: 1.00    Years: 40.00    Types: Cigarettes, E-cigarettes    Start date: 03/01/1975    Quit date: 10/05/2015  . Smokeless tobacco: Never Used     Comment: quit smoking cig 10/05/15 occ smoke ecig  . Alcohol use No  . Drug use: No  . Sexual activity: Not Currently   Other Topics Concern  . Not on file   Social History Narrative  . No narrative on file    Family History  Problem Relation Age of Onset  . Lung cancer Mother     was a smoker  . Emphysema Mother   . Cancer Mother     Lung  . Alcohol abuse Mother   . Depression Mother   . Heart disease Mother     . Drug abuse Sister   . Breast cancer Sister   . Emphysema Sister   . Bipolar disorder Sister   . Alcohol abuse Other   . Depression Other   . Bipolar disorder Other   . Heart disease Other   . Vision loss Other   . Alcohol abuse Father   . Mood Disorder Father   . Heart disease Maternal Grandmother   . Stroke Maternal Grandmother   . Esophageal cancer Maternal Aunt     Review of Systems: A 12-system review of systems was performed and was negative except as noted in the HPI.  --------------------------------------------------------------------------------------------------  Physical Exam: BP (!) 110/58 (BP Location: Left Arm, Patient Position: Sitting, Cuff Size: Normal)   Pulse 94   Ht _0  (1.6 m)   Wt 162 lb 4 oz (73.6 kg)   BMI 28.74 kg/m   General:  Well-developed, well nourished woman, seated comfortably in the exam room. HEENT: No conjunctival pallor or scleral icterus.  Moist mucous membranes.  OP clear. Neck: Supple without lymphadenopathy, thyromegaly, JVD, or HJR.  No carotid bruit. Lungs: Normal work of breathing.  Clear to auscultation bilaterally without wheezes or crackles. CV: Regular rate and rhythm without murmurs, rubs, or gallops.  Non-displaced PMI. Abd: Bowel sounds present.  Soft, NT/ND without hepatosplenomegaly Ext: No lower extremity edema.  Radial, PT, and DP pulses are 2+ bilaterally. Skin: warm and dry without rash  EKG:  Normal sinus rhythm.  No significant abnormalities.  Lab Results  Component Value Date   WBC 6.6 12/29/2015   HGB 13.7 12/29/2015   HCT 38.3 12/29/2015   MCV 88.1 12/29/2015   PLT 177 12/29/2015    Lab Results  Component Value Date   NA 136 12/29/2015   K 3.9 12/29/2015   CL 105 12/29/2015   CO2 25 12/29/2015   BUN 11 12/29/2015   CREATININE 0.96 12/29/2015   GLUCOSE 121 (H) 12/29/2015   ALT 16 12/10/2013    Lipids: Recently performed at health screen at workplace; patient will fax results to  [name].  --------------------------------------------------------------------------------------------------  ASSESSMENT AND PLAN: 1. Chest pain and coronary artery disease The patient reports marked improvement in her exertional chest pain and fatigue since starting isosorbide mononitrate. Fact, she has not had any further episodes of chest pressure. Her EKG today remains normal with incomplete beta blockade. We have agreed to increase metoprolol to 25 mg twice a day and continue with her current dose of isosorbide mononitrate. If her headache becomes more problematic, we could consider decreasing isosorbide mononitrate to 15 mg daily. The patient should remain on low-dose aspirin. Given her diabetes and CAD based on abnormal albeit low risk stress test and coronary artery calcifications on chest CTA, we have agreed to escalate her statin therapy to a high-intensity agent. The patient will fax her recent lipid panel to our office so that we have a baseline. The patient was advised to contact us if her symptoms worsen so that we can make arrangements for coronary angiography.  2. Peripheral vascular disease (HCC) Recent aortoiliac and bilateral lower extremity arterial studies show patent stent in the left common iliac artery without significant stenoses. ABIs are normal as well. I suspect her left hip pain is not due to vascular insufficiency. We will continue with risk factor modification.  Follow-up: Return to clinic in 3 months, sooner if chest pain recurs.  Nelva Bush, MD 01/04/2016 1:52 PM

## 2016-01-12 NOTE — Interval H&P Note (Signed)
History and Physical Interval Note:  01/12/2016 8:44 AM  Katelyn Cooper  has presented today for cardiac catheterization, with the diagnosis of chest pain. The various methods of treatment have been discussed with the patient and family. After consideration of risks, benefits and other options for treatment, the patient has consented to  Procedure(s): Left Heart Cath and Coronary Angiography (N/A) as a surgical intervention .  The patient's history has been reviewed, patient examined, no change in status, stable for surgery.  I have reviewed the patient's chart and labs.  Questions were answered to the patient's satisfaction.    Cath Lab Visit (complete for each Cath Lab visit)  Clinical Evaluation Leading to the Procedure:   ACS: No.  Non-ACS:    Anginal Classification: CCS III  Anti-ischemic medical therapy: Maximal Therapy (2 or more classes of medications)  Non-Invasive Test Results: Low-risk stress test findings: cardiac mortality <1%/year  Prior CABG: No previous CABG  Katelyn Cooper

## 2016-01-12 NOTE — Discharge Instructions (Signed)
Radial Site Care °Refer to this sheet in the next few weeks. These instructions provide you with information about caring for yourself after your procedure. Your health care provider may also give you more specific instructions. Your treatment has been planned according to current medical practices, but problems sometimes occur. Call your health care provider if you have any problems or questions after your procedure. °WHAT TO EXPECT AFTER THE PROCEDURE °After your procedure, it is typical to have the following: °· Bruising at the radial site that usually fades within 1-2 weeks. °· Blood collecting in the tissue (hematoma) that may be painful to the touch. It should usually decrease in size and tenderness within 1-2 weeks. °HOME CARE INSTRUCTIONS °· Take medicines only as directed by your health care provider. °· You may shower 24-48 hours after the procedure or as directed by your health care provider. Remove the bandage (dressing) and gently wash the site with plain soap and water. Pat the area dry with a clean towel. Do not rub the site, because this may cause bleeding. °· Do not take baths, swim, or use a hot tub until your health care provider approves. °· Check your insertion site every day for redness, swelling, or drainage. °· Do not apply powder or lotion to the site. °· Do not flex or bend the affected arm for 24 hours or as directed by your health care provider. °· Do not push or pull heavy objects with the affected arm for 24 hours or as directed by your health care provider. °· Do not lift over 10 lb (4.5 kg) for 5 days after your procedure or as directed by your health care provider. °· Ask your health care provider when it is okay to: °¨ Return to work or school. °¨ Resume usual physical activities or sports. °¨ Resume sexual activity. °· Do not drive home if you are discharged the same day as the procedure. Have someone else drive you. °· You may drive 24 hours after the procedure unless otherwise  instructed by your health care provider. °· Do not operate machinery or power tools for 24 hours after the procedure. °· If your procedure was done as an outpatient procedure, which means that you went home the same day as your procedure, a responsible adult should be with you for the first 24 hours after you arrive home. °· Keep all follow-up visits as directed by your health care provider. This is important. °SEEK MEDICAL CARE IF: °· You have a fever. °· You have chills. °· You have increased bleeding from the radial site. Hold pressure on the site. Call 911 °SEEK IMMEDIATE MEDICAL CARE IF: °· You have unusual pain at the radial site. °· You have redness, warmth, or swelling at the radial site. °· You have drainage (other than a small amount of blood on the dressing) from the radial site. °· The radial site is bleeding, and the bleeding does not stop after 30 minutes of holding steady pressure on the site. °· Your arm or hand becomes pale, cool, tingly, or numb. °  °This information is not intended to replace advice given to you by your health care provider. Make sure you discuss any questions you have with your health care provider. °  °Document Released: 04/21/2010 Document Revised: 04/09/2014 Document Reviewed: 10/05/2013 °Elsevier Interactive Patient Education ©2016 Elsevier Inc. ° °

## 2016-01-12 NOTE — Research (Signed)
CADLAD Informed Consent   Subject Name: Katelyn Cooper  Subject met inclusion and exclusion criteria.  The informed consent form, study requirements and expectations were reviewed with the subject and questions and concerns were addressed prior to the signing of the consent form.  The subject verbalized understanding of the trial requirements.  The subject agreed to participate in the CADLAD trial and signed the informed consent.  The informed consent was obtained prior to performance of any protocol-specific procedures for the subject.  A copy of the signed informed consent was given to the subject and a copy was placed in the subject's medical record.  [name] 01/12/2016, 0825 am

## 2016-01-13 ENCOUNTER — Telehealth: Payer: Self-pay | Admitting: Gastroenterology

## 2016-01-13 ENCOUNTER — Encounter: Payer: Self-pay | Admitting: Gastroenterology

## 2016-01-13 ENCOUNTER — Telehealth: Payer: Self-pay | Admitting: *Deleted

## 2016-01-13 NOTE — Telephone Encounter (Signed)
Katelyn Cooper,   This patient had a PV with Angela on 10/11.  Cardiac cath done on 10/12.  Could you please look at cardiac cath results and let me know if she is ok for recall colon at [facility] on 10/27/  Thank you.  KM

## 2016-01-13 NOTE — Telephone Encounter (Signed)
Katelyn Cooper,  I have reviewed the cath report. This pt is cleared for anesthetic care at [facility].   Thanks,  Jenny Reichmann

## 2016-01-13 NOTE — Telephone Encounter (Signed)
Patient will come in on Tuesday with Dr. Fuller Plan.  Negative cardiac cath yesterday.

## 2016-01-17 ENCOUNTER — Ambulatory Visit (INDEPENDENT_AMBULATORY_CARE_PROVIDER_SITE_OTHER): Payer: 59 | Admitting: Gastroenterology

## 2016-01-17 ENCOUNTER — Encounter: Payer: Self-pay | Admitting: Gastroenterology

## 2016-01-17 VITALS — BP 140/70 | HR 84 | Ht 63.0 in | Wt 162.0 lb

## 2016-01-17 DIAGNOSIS — K219 Gastro-esophageal reflux disease without esophagitis: Secondary | ICD-10-CM | POA: Diagnosis not present

## 2016-01-17 DIAGNOSIS — Z1212 Encounter for screening for malignant neoplasm of rectum: Secondary | ICD-10-CM

## 2016-01-17 DIAGNOSIS — Z1211 Encounter for screening for malignant neoplasm of colon: Secondary | ICD-10-CM | POA: Diagnosis not present

## 2016-01-17 DIAGNOSIS — R0789 Other chest pain: Secondary | ICD-10-CM | POA: Diagnosis not present

## 2016-01-17 NOTE — Patient Instructions (Signed)
Take Advil 400-600 mg three times a day.   Increase Prevacid 30 mg twice a day x 1 month then decrease to once daily.   Thank you for choosing me and [facility].  Pricilla Riffle. Dagoberto Ligas., MD., Marval Regal

## 2016-01-17 NOTE — Progress Notes (Signed)
    History of Present Illness: This is a 60 year old female referred by [name], MD for the evaluation of chest pain. She relates a several month history of lower sternal pain and pressure that is present constantly. The pain increases with activity and exertion. She notes that Advil temporarily relieves her symptoms. She has been evaluated by pulmonary and cardiology including a chest CT and cardiac cath without an etiology found. She has chronic GERD and is currently maintained on Prevacid with excellent control of reflux symptoms. EGD in June 2013 showed an esophagogastric junction stricture which was dilated, a small hiatal hernia, mild gastritis and mild duodenitis. Her chest symptoms do not change with meals. She notes about a 25 pound weight gain since discontinuing smoking a few months ago. Unfortunately she states she recently restarted smoking. Denies weight loss, abdominal pain, constipation, diarrhea, change in stool caliber, melena, hematochezia, nausea, vomiting, dysphagia, reflux symptoms.  Review of Systems: Pertinent positive and negative review of systems were noted in the above HPI section. All other review of systems were otherwise negative.  Current Medications, Allergies, Past Medical History, Past Surgical History, Family History and Social History were reviewed in [employer] record.  Physical Exam: General: Well developed, well nourished, no acute distress Head: Normocephalic and atraumatic Eyes:  sclerae anicteric, EOMI Ears: Normal auditory acuity Mouth: No deformity or lesions Neck: Supple, no masses or thyromegaly Lungs: Clear throughout to auscultation Chest: tenderness over lower sternum Heart: Regular rate and rhythm; no murmurs, rubs or bruits Abdomen: Soft, non tender and non distended. No masses, hepatosplenomegaly or hernias noted. Normal Bowel sounds Rectal: deferred to colonoscopy Musculoskeletal: Symmetrical with no gross  deformities  Skin: No lesions on visible extremities Pulses:  Normal pulses noted Extremities: No clubbing, cyanosis, edema or deformities noted Neurological: Alert oriented x 4, grossly nonfocal Cervical Nodes:  No significant cervical adenopathy Inguinal Nodes: No significant inguinal adenopathy Psychological:  Alert and cooperative. Normal mood and affect  Assessment and Recommendations:  1. Anterior chest wall pain. Pain reproduced/exacerbated by palpation over lower sternum. Presumed costochondritis. Symptoms relieved with ibuprofen. These symptoms are not consistent with a gastrointestinal source of pain. Will increase Prevacid to 30 mg twice daily and assess response. Continue ibuprofen 400-600 mg 3 times a day as needed and follow-up on with her PCP for this problem.  2. GERD with a history of an esophageal stricture. See problem #1.  3. CRC screening, average risk. 10 year interval screening colonoscopy is scheduled for later this month.  cc: [name], MD [address] [place], [place] [address]

## 2016-01-18 ENCOUNTER — Telehealth: Payer: Self-pay | Admitting: *Deleted

## 2016-01-18 ENCOUNTER — Other Ambulatory Visit: Payer: Self-pay | Admitting: Family Medicine

## 2016-01-18 ENCOUNTER — Other Ambulatory Visit: Payer: Self-pay | Admitting: Psychiatry

## 2016-01-18 NOTE — Telephone Encounter (Signed)
-----   Message from Nelva Bush, MD sent at 01/18/2016  7:31 AM EDT ----- Regarding: Follow-up Hello,  Ms. Mcneff catheterization last week showed only mild CAD.  Can we schedule her to see me in the office in 3-4 weeks for follow-up?  Thanks.  Gerald Stabs

## 2016-01-18 NOTE — Telephone Encounter (Signed)
Notified patient that Dr. Saunders Revel would like to follow up with her in 3-4 weeks to see how things are going. She also wanted to let Dr. Saunders Revel know that she had to stop taking the isosorbide mononitrate due to extreme headaches. Let her know that I would make him aware and that we would see her at next appointment. She verbalized understanding and had no further questions. Appointment scheduled for her to come in and see Dr. Saunders Revel.

## 2016-01-19 ENCOUNTER — Telehealth: Payer: Self-pay

## 2016-01-19 NOTE — Telephone Encounter (Signed)
called in rx for zaleplon 5mg   #30 no refills pt has appt in  02-16-16.

## 2016-01-19 NOTE — Telephone Encounter (Signed)
received a request for refill on zaleplon.  pt was last seen on  11-17-15 next appt  02-16-16. pt will not have enough medication to make it to appt date.

## 2016-01-19 NOTE — Telephone Encounter (Signed)
That sounds fine.  If she has any new symptoms, she should let us know.  Otherwise we will follow-up as previously discussed.

## 2016-01-20 ENCOUNTER — Other Ambulatory Visit: Payer: Self-pay

## 2016-01-20 MED ORDER — METOPROLOL TARTRATE 25 MG PO TABS: 50.0000 mg | ORAL_TABLET | Freq: Two times a day (BID) | ORAL | 1 refills | Status: DC

## 2016-01-20 NOTE — Telephone Encounter (Signed)
Pharmacy request refill. 

## 2016-01-23 ENCOUNTER — Telehealth: Payer: Self-pay | Admitting: Internal Medicine

## 2016-01-23 DIAGNOSIS — G4733 Obstructive sleep apnea (adult) (pediatric): Secondary | ICD-10-CM

## 2016-01-23 NOTE — Telephone Encounter (Signed)
Pt calling about her breathing test she did a few weeks ago She did this at home and brought it in the following Monday

## 2016-01-23 NOTE — Telephone Encounter (Signed)
Per Rodena Piety at [facility] [place] has been scanned in chart. Will look and inform pt of results.

## 2016-01-23 NOTE — Telephone Encounter (Signed)
nope

## 2016-01-23 NOTE — Telephone Encounter (Signed)
Have you received the HST results for this pt? She is calling about them.

## 2016-01-24 NOTE — Telephone Encounter (Signed)
Pt informed of HST results. Will place order for auto CPAP of 5-20cm. Order placed. Nothing further needed.

## 2016-01-27 ENCOUNTER — Encounter: Payer: Self-pay | Admitting: Gastroenterology

## 2016-01-27 ENCOUNTER — Ambulatory Visit (AMBULATORY_SURGERY_CENTER): Payer: 59 | Admitting: Gastroenterology

## 2016-01-27 VITALS — BP 107/60 | HR 68 | Temp 98.6°F | Resp 19 | Ht 63.0 in | Wt 162.0 lb

## 2016-01-27 DIAGNOSIS — Z1212 Encounter for screening for malignant neoplasm of rectum: Secondary | ICD-10-CM

## 2016-01-27 DIAGNOSIS — Z1211 Encounter for screening for malignant neoplasm of colon: Secondary | ICD-10-CM

## 2016-01-27 DIAGNOSIS — D124 Benign neoplasm of descending colon: Secondary | ICD-10-CM

## 2016-01-27 MED ORDER — SODIUM CHLORIDE 0.9 % IV SOLN: 500.0000 mL | INTRAVENOUS | Status: DC

## 2016-01-27 NOTE — Progress Notes (Signed)
Report to PACU, RN, vss, BBS= Clear.  

## 2016-01-27 NOTE — Op Note (Signed)
[name] Procedure Date: 01/27/2016 10:57 AM MRN: [id number] Endoscopist: Ladene Artist , MD Age: 60 Referring MD:  Date of Birth: 25-Feb-1956 Gender: Female Account #: [id number] Procedure:                Colonoscopy Indications:              Screening for colorectal malignant neoplasm Medicines:                Monitored Anesthesia Care Procedure:                Pre-Anesthesia Assessment:                           - Prior to the procedure, a History and Physical                            was performed, and patient medications and                            allergies were reviewed. The patient's tolerance of                            previous anesthesia was also reviewed. The risks                            and benefits of the procedure and the sedation                            options and risks were discussed with the patient.                            All questions were answered, and informed consent                            was obtained. Prior Anticoagulants: The patient has                            taken no previous anticoagulant or antiplatelet                            agents. ASA Grade Assessment: II - A patient with                            mild systemic disease. After reviewing the risks                            and benefits, the patient was deemed in                            satisfactory condition to undergo the procedure.                           After obtaining informed consent, the colonoscope  was passed under direct vision. Throughout the                            procedure, the patient's blood pressure, pulse, and                            oxygen saturations were monitored continuously. The                            Model PCF-[id number]) scope was introduced                            through the anus and advanced to the the cecum,                            identified by  appendiceal orifice and ileocecal                            valve. The ileocecal valve, appendiceal orifice,                            and rectum were photographed. The quality of the                            bowel preparation was good. The colonoscopy was                            performed without difficulty. The patient tolerated                            the procedure well. Scope In: 11:13:32 AM Scope Out: 11:27:41 AM Scope Withdrawal Time: 0 hours 11 minutes 3 seconds  Total Procedure Duration: 0 hours 14 minutes 9 seconds  Findings:                 The perianal and digital rectal examinations were                            normal.                           A 6 mm polyp was found in the descending colon. The                            polyp was sessile. The polyp was removed with a                            cold snare. Resection and retrieval were complete.                           The exam was otherwise without abnormality on                            direct and retroflexion views. Complications:  No immediate complications. Estimated blood loss:                            None. Estimated Blood Loss:     Estimated blood loss: none. Impression:               - One 6 mm polyp in the descending colon, removed                            with a cold snare. Resected and retrieved.                           - The examination was otherwise normal on direct                            and retroflexion views. Recommendation:           - Repeat colonoscopy in 5 years for surveillance if                            polyp is precancerous, otherwise 10 years for                            screening.                           - Patient has a contact number available for                            emergencies. The signs and symptoms of potential                            delayed complications were discussed with the                            patient. Return to normal  activities tomorrow.                            Written discharge instructions were provided to the                            patient.                           - Resume previous diet.                           - Continue present medications.                           - Await pathology results. Ladene Artist, MD 01/27/2016 11:31:16 AM This report has been signed electronically.

## 2016-01-27 NOTE — Progress Notes (Signed)
Called to room to assist during endoscopic procedure.  Patient ID and intended procedure confirmed with present staff. Received instructions for my participation in the procedure from the performing physician.  

## 2016-01-27 NOTE — Patient Instructions (Signed)
YOU HAD AN ENDOSCOPIC PROCEDURE TODAY AT [facility]:   Refer to the procedure report that was given to you for any specific questions about what was found during the examination.  If the procedure report does not answer your questions, please call your gastroenterologist to clarify.  If you requested that your care partner not be given the details of your procedure findings, then the procedure report has been included in a sealed envelope for you to review at your convenience later.  YOU SHOULD EXPECT: Some feelings of bloating in the abdomen. Passage of more gas than usual.  Walking can help get rid of the air that was put into your GI tract during the procedure and reduce the bloating. If you had a lower endoscopy (such as a colonoscopy or flexible sigmoidoscopy) you may notice spotting of blood in your stool or on the toilet paper. If you underwent a bowel prep for your procedure, you may not have a normal bowel movement for a few days.  Please Note:  You might notice some irritation and congestion in your nose or some drainage.  This is from the oxygen used during your procedure.  There is no need for concern and it should clear up in a day or so.  SYMPTOMS TO REPORT IMMEDIATELY:   Following lower endoscopy (colonoscopy or flexible sigmoidoscopy):  Excessive amounts of blood in the stool  Significant tenderness or worsening of abdominal pains  Swelling of the abdomen that is new, acute  Fever of 100F or higher   For urgent or emergent issues, a gastroenterologist can be reached at any hour by calling [phone].   DIET:  We do recommend a small meal at first, but then you may proceed to your regular diet.  Drink plenty of fluids but you should avoid alcoholic beverages for 24 hours.  ACTIVITY:  You should plan to take it easy for the rest of today and you should NOT DRIVE or use heavy machinery until tomorrow (because of the sedation medicines used during the test).     FOLLOW UP: Our staff will call the number listed on your records the next business day following your procedure to check on you and address any questions or concerns that you may have regarding the information given to you following your procedure. If we do not reach you, we will leave a message.  However, if you are feeling well and you are not experiencing any problems, there is no need to return our call.  We will assume that you have returned to your regular daily activities without incident.  If any biopsies were taken you will be contacted by phone or by letter within the next 1-3 weeks.  Please call us at [phone] if you have not heard about the biopsies in 3 weeks.    SIGNATURES/CONFIDENTIALITY: You and/or your care partner have signed paperwork which will be entered into your electronic medical record.  These signatures attest to the fact that that the information above on your After Visit Summary has been reviewed and is understood.  Full responsibility of the confidentiality of this discharge information lies with you and/or your care-partner.  Polyps-handout given  Repeat colonoscopy in 5 years 2022.

## 2016-01-30 ENCOUNTER — Telehealth: Payer: Self-pay | Admitting: *Deleted

## 2016-01-30 NOTE — Telephone Encounter (Signed)
  Follow up Call-  Call back number 01/27/2016  Post procedure Call Back phone  # [phone]  Permission to leave phone message Yes  Some recent data might be hidden     Patient questions:  Do you have a fever, pain , or abdominal swelling? No. Pain Score  0 *  Have you tolerated food without any problems? Yes.    Have you been able to return to your normal activities? Yes.    Do you have any questions about your discharge instructions: Diet   No. Medications  No. Follow up visit  No.  Do you have questions or concerns about your Care? No.  Actions: * If pain score is 4 or above: No action needed, pain <4.

## 2016-02-06 ENCOUNTER — Other Ambulatory Visit: Payer: Self-pay

## 2016-02-06 MED ORDER — ATORVASTATIN CALCIUM 40 MG PO TABS: 40.0000 mg | ORAL_TABLET | Freq: Every day | ORAL | 2 refills | Status: DC

## 2016-02-08 ENCOUNTER — Encounter: Payer: Self-pay | Admitting: Internal Medicine

## 2016-02-08 ENCOUNTER — Ambulatory Visit (INDEPENDENT_AMBULATORY_CARE_PROVIDER_SITE_OTHER): Payer: 59 | Admitting: Internal Medicine

## 2016-02-08 ENCOUNTER — Encounter: Payer: Self-pay | Admitting: Gastroenterology

## 2016-02-08 VITALS — BP 90/60 | HR 82 | Ht 63.0 in | Wt 162.0 lb

## 2016-02-08 DIAGNOSIS — R079 Chest pain, unspecified: Secondary | ICD-10-CM

## 2016-02-08 MED ORDER — METOPROLOL TARTRATE 25 MG PO TABS: 25.0000 mg | ORAL_TABLET | Freq: Two times a day (BID) | ORAL | 5 refills | Status: DC

## 2016-02-08 NOTE — Patient Instructions (Signed)
Medication Instructions:  Your physician has recommended you make the following change in your medication:  DECREASE metoprolol to 25mg  twice daily   Labwork: none  Testing/Procedures: none  Follow-Up: Your physician wants you to follow-up in: 6 months with Dr. Saunders Revel.  You will receive a reminder letter in the mail two months in advance. If you don't receive a letter, please call our office to schedule the follow-up appointment.   Any Other Special Instructions Will Be Listed Below (If Applicable).     If you need a refill on your cardiac medications before your next appointment, please call your pharmacy.

## 2016-02-08 NOTE — Progress Notes (Signed)
Follow-up Outpatient Visit Date: 02/08/2016  Chief Complaint  Patient presents with  . Follow-up    PATIENT STATES SHE IS HAVING A LITTLE PRESSURE IN HER CHEST. NO CP, SOB OR SWELLING. NO OTHER COMPLAINTS.     HPI:  Katelyn Cooper is a 60 y.o. year-old female with history of diet controlled diabetes mellitus, PVD with prior left iliac stent in the 1990s, thoracic outlet syndrome status post bilateral rib resections in 1115, GERD complicated by esophageal stricture, hyperlipidemia, and anxiety/depression, who presents for follow-up of chest pain. I first met the patient on 12/12/15 for evaluation of chest tightness, dyspnea, and fatigue. Subsequent workup revealed normal LVEF by echo and myocardial perfusion stress test with a small reversible apical and apical anterior defect. She continued to have symptoms despite medical therapy with metoprolol and isosorbide mononitrate. We ultimately proceeded with coronary angiography on 01/12/16, which revealed mild, nonobstructive CAD in the setting of sluggish flow through the coronaries that may reflect microvascular dysfunction.  Katelyn Cooper was intolerant of isosorbide mononitrate due to significant headaches that have improved with discontinuation of the medication. She remains on metoprolol 50 mg twice a day. She is still having some occasional chest pressure but notes it is not as severe this summer. She predominantly notices the discomfort when she lays down at night. She has had some improvement with ibuprofen, that was recommended for possible costochondritis. She is not exercising regularly but hopes to become more active. The patient notes continued generalized fatigue. She believes that a lot of her symptoms may be related to stress at work and is contemplating retirement within the next 6 months. She has not had any shortness of breath, lightheadedness, palpitations, or edema. She received her CPAP machine yesterday for recently diagnosed obstructive sleep  apnea and will begin using at this weekend.  --------------------------------------------------------------------------------------------------  Cardiovascular History & Procedures: Cardiovascular Problems:  Nonobstructive coronary artery disease  Peripheral vascular status post left iliac stent  Risk Factors:  Diabetes mellitus, hyperlipidemia, peripheral vascular disease  Cath/PCI:  LHC (01/12/16): LMCA and LAD normal. There is a 40% stenosis in the proximal aspect of the large first septal perforator. There is 30% OM1 stenosis. 30% proximal and mid RCA lesions are also present. LVEDP mildly elevated at 15-20 mmHg.  CV Surgery:  Remote left iliac stent.  EP Procedures and Devices:  None.  Non-Invasive Evaluation(s):  Pharmacologic myocardial perfusion stress test (12/23/15): Small, mild area of reversible defect involving the apical anterior and apical segments that could reflect ischemia or breast attenuation. LVEF 55-65%. Borderline TID was noted.  Transthoracic echocardiogram (12/28/15): Normal LV systolic and diastolic function (EF 52-08%). No significant valvular abnormalities. Normal RV size and function.  Aortoiliac and lower extremity Dopplers: Pain stent in the left common iliac artery. No significant abnormalities. ABI right 1.05, left 1.03.  Recent CV Pertinent Labs: Lab Results  Component Value Date   INR 1.06 01/06/2016   K 4.4 01/06/2016   BUN 14 01/06/2016   BUN 9 08/10/2015   CREATININE 1.13 (H) 01/06/2016   Past medical and surgical history were reviewed and updated in EPIC.   Outpatient Encounter Prescriptions as of 02/08/2016  Medication Sig  . albuterol (PROVENTIL HFA;VENTOLIN HFA) 108 (90 Base) MCG/ACT inhaler Inhale 2 puffs into the lungs every 6 (six) hours as needed for wheezing or shortness of breath.  [name] aspirin EC 81 MG tablet Take 1 tablet (81 mg total) by mouth daily.  [name] atorvastatin (LIPITOR) 40 MG tablet Take 1 tablet (40 mg  total) by  mouth daily.  . cholecalciferol (VITAMIN D) 1000 units tablet Take 1,000 Units by mouth daily.  [name] glucose blood test strip 1 each by Other route as needed for other (ONE TOUCH ULTRA TEST STRIPS AND LANCETS). Use as instructed  . ibuprofen (ADVIL) 200 MG tablet Take 400 mg by mouth every 6 (six) hours as needed for headache.   . lansoprazole (PREVACID) 30 MG capsule Take 30 mg by mouth daily.  . metoprolol tartrate (LOPRESSOR) 25 MG tablet Take 2 tablets (50 mg total) by mouth 2 (two) times daily.  . nitroGLYCERIN (NITROSTAT) 0.4 MG SL tablet Place 1 tablet (0.4 mg total) under the tongue every 5 (five) minutes as needed for chest pain.  [name] nystatin cream (MYCOSTATIN) APPLY TOPICALLY IF NEEDED FOR 14 DAYS  . QUEtiapine (SEROQUEL) 200 MG tablet Take 1 tablet (200 mg total) by mouth at bedtime.  [name] QUEtiapine (SEROQUEL) 25 MG tablet Take 1 tablet (25 mg total) by mouth 2 (two) times daily.  [name] venlafaxine XR (EFFEXOR-XR) 75 MG 24 hr capsule Take 1 capsule (75 mg total) by mouth daily with breakfast. Pt has supply (Patient taking differently: Take 150 mg by mouth daily with breakfast. Pt has supply)  . zaleplon (SONATA) 5 MG capsule Take 1 capsule (5 mg total) by mouth at bedtime as needed for sleep.  . [DISCONTINUED] isosorbide mononitrate (IMDUR) 30 MG 24 hr tablet Take 1 tablet (30 mg total) by mouth daily.  . [DISCONTINUED] nicotine (NICODERM CQ - DOSED IN MG/24 HOURS) 21 mg/24hr patch Place 1 patch (21 mg total) onto the skin daily.  . [DISCONTINUED] QUEtiapine (SEROQUEL) 25 MG tablet TAKE 1 TABLET BY MOUTH TWO  TIMES DAILY   Facility-Administered Encounter Medications as of 02/08/2016  Medication  . 0.9 %  sodium chloride infusion    Allergies: Lovastatin; Paroxetine hcl; and Lamotrigine  Social History   Social History  . Marital status: Married    Spouse name: N/A  . Number of children: 0  . Years of education: N/A   Occupational History  . computer [employer]  Topics  . Smoking status: Current Every Day Smoker    Packs/day: 1.00    Years: 40.00    Types: Cigarettes, E-cigarettes    Start date: 03/01/1975    Last attempt to quit: 10/05/2015  . Smokeless tobacco: Never Used     Comment: using patches  . Alcohol use No  . Drug use: No  . Sexual activity: Not Currently   Other Topics Concern  . Not on file   Social History Narrative  . No narrative on file    Family History  Problem Relation Age of Onset  . Lung cancer Mother     was a smoker  . Emphysema Mother   . Cancer Mother     Lung  . Alcohol abuse Mother   . Depression Mother   . Heart disease Mother   . Drug abuse Sister   . Breast cancer Sister   . Emphysema Sister   . Bipolar disorder Sister   . Alcohol abuse Other   . Depression Other   . Bipolar disorder Other   . Heart disease Other   . Vision loss Other   . Alcohol abuse Father   . Mood Disorder Father   . Heart disease Maternal Grandmother   . Stroke Maternal Grandmother   . Esophageal cancer Maternal Aunt   . Colon cancer Neg Hx  Review of Systems: A 12-system review of systems was performed and was negative except as noted in the HPI.  --------------------------------------------------------------------------------------------------  Physical Exam: BP 90/60 (BP Location: Left Arm, Patient Position: Sitting, Cuff Size: Normal)   Pulse 82   Ht _0  (1.6 m)   Wt 162 lb (73.5 kg)   BMI 28.70 kg/m   General:  Well-developed, well nourished woman, seated comfortably in the exam room. HEENT: No conjunctival pallor or scleral icterus.  Moist mucous membranes.  OP clear. Neck: Supple without lymphadenopathy, thyromegaly, JVD, or HJR.  No carotid bruit. Lungs: Normal work of breathing.  Clear to auscultation bilaterally without wheezes or crackles. CV: Regular rate and rhythm without murmurs, rubs, or gallops.  Non-displaced PMI. Abd: Bowel sounds present.  Soft, NT/ND without hepatosplenomegaly Ext:  No lower extremity edema.  Radial, PT, and DP pulses are 2+ bilaterally.Right radial arteriotomy is well-healed with normal Allen's test. Skin: warm and dry without rash  EKG:  Normal sinus rhythm.  No significant abnormalities.  Lab Results  Component Value Date   WBC 6.7 01/06/2016   HGB 12.5 01/06/2016   HCT 36.1 01/06/2016   MCV 88.5 01/06/2016   PLT 174 01/06/2016    Lab Results  Component Value Date   NA 137 01/06/2016   K 4.4 01/06/2016   CL 104 01/06/2016   CO2 27 01/06/2016   BUN 14 01/06/2016   CREATININE 1.13 (H) 01/06/2016   GLUCOSE 138 (H) 01/06/2016   ALT 16 12/10/2013    --------------------------------------------------------------------------------------------------  ASSESSMENT AND PLAN: 1. Coronary artery disease Recent cardiac catheterization revealed nonobstructive CAD. The patient has continued to have some intermittent chest pain which sounds very atypical and is reproducible with palpation of her anterior chest wall. I suspect she has some degree of costochondritis or other muscular skeletal chest pain. She can continue with as needed ibuprofen. Given her overall fatigue and borderline low blood pressure today, we have agreed to decrease metoprolol to 25 mg twice a day. We will continue to hold isosorbide mononitrate given her headaches. I am hopeful that treatment of her sleep apnea will also make her feel better.  2. Peripheral vascular disease No new symptoms at this time. Recent vascular studies revealed no significant stenosis involving either lower extremity, including the patient's previous left iliac stent.  Follow-up: Return to clinic in 6 months.  Nelva Bush, MD 02/08/2016 2:15 PM

## 2016-02-09 ENCOUNTER — Encounter: Payer: Self-pay | Admitting: Internal Medicine

## 2016-02-16 ENCOUNTER — Encounter: Payer: Self-pay | Admitting: Psychiatry

## 2016-02-16 ENCOUNTER — Ambulatory Visit (INDEPENDENT_AMBULATORY_CARE_PROVIDER_SITE_OTHER): Payer: 59 | Admitting: Psychiatry

## 2016-02-16 VITALS — BP 118/64 | HR 84 | Temp 98.2°F | Resp 16 | Wt 162.6 lb

## 2016-02-16 DIAGNOSIS — F3160 Bipolar disorder, current episode mixed, unspecified: Secondary | ICD-10-CM

## 2016-02-16 DIAGNOSIS — F4312 Post-traumatic stress disorder, chronic: Secondary | ICD-10-CM

## 2016-02-16 MED ORDER — QUETIAPINE FUMARATE 200 MG PO TABS: 300.0000 mg | ORAL_TABLET | Freq: Every day | ORAL | 1 refills | Status: DC

## 2016-02-16 MED ORDER — QUETIAPINE FUMARATE 25 MG PO TABS: 25.0000 mg | ORAL_TABLET | ORAL | 1 refills | Status: DC

## 2016-02-16 MED ORDER — ZALEPLON 5 MG PO CAPS: 5.0000 mg | ORAL_CAPSULE | Freq: Every evening | ORAL | 1 refills | Status: DC | PRN

## 2016-02-16 MED ORDER — VENLAFAXINE HCL ER 75 MG PO CP24: 75.0000 mg | ORAL_CAPSULE | Freq: Every day | ORAL | 1 refills | Status: DC
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
## 2016-03-06 ENCOUNTER — Encounter: Payer: Self-pay | Admitting: Internal Medicine

## 2016-03-09 ENCOUNTER — Other Ambulatory Visit: Payer: Self-pay | Admitting: Family Medicine

## 2016-03-15 ENCOUNTER — Ambulatory Visit (INDEPENDENT_AMBULATORY_CARE_PROVIDER_SITE_OTHER): Payer: 59 | Admitting: Internal Medicine

## 2016-03-15 ENCOUNTER — Encounter: Payer: Self-pay | Admitting: Internal Medicine

## 2016-03-15 VITALS — BP 110/80 | HR 105 | Ht 63.0 in | Wt 161.0 lb

## 2016-03-15 DIAGNOSIS — Z9989 Dependence on other enabling machines and devices: Secondary | ICD-10-CM

## 2016-03-15 DIAGNOSIS — G4733 Obstructive sleep apnea (adult) (pediatric): Secondary | ICD-10-CM

## 2016-03-15 NOTE — Patient Instructions (Signed)
Continue CPAP therapy for OSA

## 2016-03-15 NOTE — Progress Notes (Signed)
[facility] Consultation     Date: 03/15/2016,   MRN# [id number] Katelyn Cooper 15-Apr-1955 Code Status:  Code Status History    This patient does not have a recorded code status. Please follow your organizational policy for patients in this situation.     Hosp day:@LENGTHOFSTAYDAYS @ Referring MD: @ATDPROV @     PCP:      Admission                  Current  Katelyn Cooper is a 60 y.o. old female seen in consultation for cough, pleuritic chest pain. Previous BQ patient     CHIEF COMPLAINT:   Follow up [facility]   Patient  Follow up Sleep study AHI 11.7 Noted to have desats 67 times compliance report of 87% AHI down to 6  Patient has more energy, feels better, fatigue has improved No more frequent naps   PFT's 08/2015 reviewed with patient ratio 80% Fev1 2.2 L 87% FVC 2.6L 83%  Ct chest 08/30/15 Left lung Nodule approx 6 MM increased from 2-3 MM from 2013.    CT chest 12/2015 Left Lung nodule approx 6 MM-no change from previous CT scans   Current Medication:   Current Outpatient Prescriptions:  .  albuterol (PROVENTIL HFA;VENTOLIN HFA) 108 (90 Base) MCG/ACT inhaler, Inhale 2 puffs into the lungs every 6 (six) hours as needed for wheezing or shortness of breath., Disp: 1 Inhaler, Rfl: 2 .  aspirin EC 81 MG tablet, Take 1 tablet (81 mg total) by mouth daily., Disp: , Rfl:  .  atorvastatin (LIPITOR) 40 MG tablet, Take 1 tablet (40 mg total) by mouth daily., Disp: 90 tablet, Rfl: 2 .  cholecalciferol (VITAMIN D) 1000 units tablet, Take 1,000 Units by mouth daily., Disp: , Rfl:  .  glucose blood test strip, 1 each by Other route as needed for other (ONE TOUCH ULTRA TEST STRIPS AND LANCETS). Use as instructed, Disp: , Rfl:  .  ibuprofen (ADVIL) 200 MG tablet, Take 400 mg by mouth every 6 (six) hours as needed for headache. , Disp: , Rfl:  .  lansoprazole (PREVACID) 30 MG capsule, Take 30 mg by mouth daily., Disp: , Rfl:    .  metoprolol tartrate (LOPRESSOR) 25 MG tablet, TAKE 2 TABLETS BY MOUTH TWO TIMES DAILY, Disp: 360 tablet, Rfl: 2 .  nitroGLYCERIN (NITROSTAT) 0.4 MG SL tablet, Place 1 tablet (0.4 mg total) under the tongue every 5 (five) minutes as needed for chest pain., Disp: 30 tablet, Rfl: prn .  nystatin cream (MYCOSTATIN), APPLY TOPICALLY IF NEEDED FOR 14 DAYS, Disp: , Rfl: 0 .  QUEtiapine (SEROQUEL) 200 MG tablet, Take 1.5 tablets (300 mg total) by mouth at bedtime., Disp: 135 tablet, Rfl: 1 .  QUEtiapine (SEROQUEL) 25 MG tablet, Take 1 tablet (25 mg total) by mouth every morning., Disp: 90 tablet, Rfl: 1 .  venlafaxine XR (EFFEXOR-XR) 75 MG 24 hr capsule, Take 1 capsule (75 mg total) by mouth daily with breakfast., Disp: 90 capsule, Rfl: 1 .  zaleplon (SONATA) 5 MG capsule, Take 1 capsule (5 mg total) by mouth at bedtime as needed for sleep., Disp: 30 capsule, Rfl: 1  Current Facility-Administered Medications:  .  0.9 %  sodium chloride infusion, 500 mL, Intravenous, Continuous, Ladene Artist, MD     ALLERGIES   Lovastatin; Paroxetine hcl; and Lamotrigine     REVIEW OF SYSTEMS   Review of Systems  Constitutional: Negative for chills, fever, malaise/fatigue  and weight loss.  HENT: Negative for congestion.   Respiratory: Positive for wheezing. Negative for cough, hemoptysis, sputum production and shortness of breath.   Cardiovascular: Negative for chest pain, palpitations, orthopnea and leg swelling.  Gastrointestinal: Negative for heartburn, nausea and vomiting.  Musculoskeletal: Negative for back pain.  Psychiatric/Behavioral: The patient is not nervous/anxious.   All other systems reviewed and are negative.   BP 110/80 (BP Location: Left Arm, Cuff Size: Normal)   Pulse (!) 105   Ht 5\' 3"  (1.6 m)   Wt 161 lb (73 kg)   SpO2 100%   BMI 28.52 kg/m     PHYSICAL EXAM   Physical Exam  Constitutional: She is oriented to person, place, and time. No distress.  HENT:   Mouth/Throat: No oropharyngeal exudate.  Cardiovascular: Normal rate, regular rhythm and normal heart sounds.   No murmur heard. Pulmonary/Chest: Effort normal and breath sounds normal. No stridor. No respiratory distress. She has no wheezes. She has no rales.  Musculoskeletal: Normal range of motion. She exhibits no edema.  Neurological: She is alert and oriented to person, place, and time. No cranial nerve deficit.  Skin: Skin is warm. She is not diaphoretic.  Psychiatric: She has a normal mood and affect.        ASSESSMENT/PLAN   60 yo white female with signs and symptoms of intermittent reactive airways disease from vaping, quit tobacoo abuse 10/2015  left lung solitary pulm nodule that is stable over past 6 months, with OSA    Her OSA has imprived with therapy, her reactive airways disease is stable. No acute issues at this time   1.repeat CT chest in 6 months for interval changes 2.continue albuterol as needed 3.smoking/Vaping  cessation strongly advised 4.contien CPAP therapy for OSA  Follow up 6 months  The Patient requires high complexity decision making for assessment and support, frequent evaluation and titration of therapies, application of advanced monitoring technologies and extensive interpretation of multiple databases.  Patient are satisfied with Plan of action and management. All questions answered   [name], M.D.  [facility]  Medical Director [place] Director [facility] Cardio[facility] Department

## 2016-04-06 ENCOUNTER — Ambulatory Visit: Payer: 59 | Admitting: Internal Medicine

## 2016-04-18 ENCOUNTER — Ambulatory Visit: Payer: 59 | Admitting: Psychiatry

## 2016-04-30 ENCOUNTER — Encounter: Payer: Self-pay | Admitting: Family Medicine

## 2016-04-30 ENCOUNTER — Ambulatory Visit (INDEPENDENT_AMBULATORY_CARE_PROVIDER_SITE_OTHER): Payer: 59 | Admitting: Family Medicine

## 2016-04-30 ENCOUNTER — Other Ambulatory Visit: Payer: Self-pay | Admitting: Psychiatry

## 2016-04-30 ENCOUNTER — Ambulatory Visit
Admission: RE | Admit: 2016-04-30 | Discharge: 2016-04-30 | Disposition: A | Discharge status: Home / Self Care | Payer: 59 | Source: Ambulatory Visit | Attending: Family Medicine | Admitting: Family Medicine

## 2016-04-30 VITALS — BP 100/58 | HR 92 | Temp 98.1°F | Resp 17 | Wt 165.6 lb

## 2016-04-30 DIAGNOSIS — R058 Other specified cough: Secondary | ICD-10-CM

## 2016-04-30 DIAGNOSIS — I700 Atherosclerosis of aorta: Secondary | ICD-10-CM | POA: Insufficient documentation

## 2016-04-30 DIAGNOSIS — R05 Cough: Secondary | ICD-10-CM | POA: Insufficient documentation

## 2016-04-30 DIAGNOSIS — M545 Low back pain, unspecified: Secondary | ICD-10-CM

## 2016-04-30 DIAGNOSIS — R918 Other nonspecific abnormal finding of lung field: Secondary | ICD-10-CM | POA: Diagnosis not present

## 2016-04-30 MED ORDER — CYCLOBENZAPRINE HCL 5 MG PO TABS: 5.0000 mg | ORAL_TABLET | Freq: Three times a day (TID) | ORAL | 0 refills | Status: DC | PRN

## 2016-04-30 NOTE — Patient Instructions (Signed)
Continue ibuprofen 800 mg. Every 6 hours with food. Take muscle relaxant as needed pending X-ray report.

## 2016-04-30 NOTE — Progress Notes (Signed)
Subjective:     Patient ID: Katelyn Cooper, female   DOB: 08-15-1955, 61 y.o.   MRN: [id number]  HPI  Chief Complaint  Patient presents with  . Cough    Patient comes in office today with complaints of cough x3 weeks intermittent. Patient reports over weekend she began to have dull ache on the right side of her lower back radiating up to shoulder. Patient reports taking otc Ichy/Hot with lidocaine and Robitussin.  States she had cold sx at onset now with occasional non-productive cough without fever. Has resumed smoking. Hx of left pulmonary nodule followed by pulmonary. Has also taken ibuprofen 800 mg.with minimal improvement in her sx. Back pain increases with breathing and cough and feels better when she awakens in the AM.   Review of Systems     Objective:   Physical Exam  Constitutional: She appears well-developed and well-nourished. No distress.  Pulmonary/Chest: Breath sounds normal. She has no wheezes.  Musculoskeletal:  Localizes pain to her right scapular area with radiation towards her shoulder and lumbar area. Non-tender to the touch.  Skin: No rash noted.       Assessment:    1. Acute right-sided low back pain without sciatica - DG Chest 2 View; Future - cyclobenzaprine (FLEXERIL) 5 MG tablet; Take 1 tablet (5 mg total) by mouth 3 (three) times daily as needed for muscle spasms.  Dispense: 21 tablet; Refill: 0  2. Post-viral cough syndrome - DG Chest 2 View; Future    Plan:    Continue ibuprofen; further f/u pending x-ray report.

## 2016-05-01 ENCOUNTER — Telehealth: Payer: Self-pay | Admitting: Family Medicine

## 2016-05-01 ENCOUNTER — Other Ambulatory Visit: Payer: Self-pay | Admitting: Family Medicine

## 2016-05-01 MED ORDER — TRAMADOL HCL 50 MG PO TABS: 50.0000 mg | ORAL_TABLET | Freq: Four times a day (QID) | ORAL | 0 refills | Status: DC | PRN

## 2016-05-01 NOTE — Telephone Encounter (Signed)
Let patient know you have called in tramadol for her.

## 2016-05-01 NOTE — Telephone Encounter (Signed)
Pt stated that she saw Mikki Santee for OV 04/30/16 and has been taking cyclobenzaprine (FLEXERIL) 5 MG tablet as directed and laying on heating pad. Pt stated that it hasn't helped with her pain or giving her any relief. Pt stated that she is having a hard time sleeping because of the pain. Pt wanted to see if there was something else she could try. Please advise. Thanks TNP

## 2016-05-01 NOTE — Telephone Encounter (Signed)
Patient seen in office yesterday for back pain, since it has only been 24hrs since visit is there and otc medication she can take with Flexeril to help with pain? Please review note and advise. KW

## 2016-05-01 NOTE — Telephone Encounter (Signed)
LMTCB ED 

## 2016-05-01 NOTE — Progress Notes (Signed)
Prescription has been called into pharmacy. KW 

## 2016-05-02 NOTE — Telephone Encounter (Signed)
faxed and confirmed rx for sonata 5mg  id # [id number] order # [id number]

## 2016-05-02 NOTE — Telephone Encounter (Signed)
Left patient a voicemail advising her that the RX has be called in at the pharmacy.

## 2016-05-02 NOTE — Telephone Encounter (Signed)
done

## 2016-05-23 ENCOUNTER — Telehealth: Payer: Self-pay

## 2016-05-23 ENCOUNTER — Ambulatory Visit: Payer: 59 | Admitting: Psychiatry

## 2016-05-23 NOTE — Telephone Encounter (Signed)
pt states she will not have enough medication since she had to r/s appt pt states she needs zaleplon  [facility] [address]

## 2016-05-23 NOTE — Telephone Encounter (Signed)
Called in rx for zaleplon #30 no additonal refills

## 2016-06-13 ENCOUNTER — Ambulatory Visit (INDEPENDENT_AMBULATORY_CARE_PROVIDER_SITE_OTHER): Payer: 59 | Admitting: Psychiatry

## 2016-06-13 ENCOUNTER — Encounter: Payer: Self-pay | Admitting: Psychiatry

## 2016-06-13 DIAGNOSIS — F3160 Bipolar disorder, current episode mixed, unspecified: Secondary | ICD-10-CM | POA: Diagnosis not present

## 2016-06-13 MED ORDER — VENLAFAXINE HCL ER 75 MG PO CP24: 75.0000 mg | ORAL_CAPSULE | Freq: Every day | ORAL | 1 refills | Status: DC

## 2016-06-13 MED ORDER — ZALEPLON 10 MG PO CAPS: 10.0000 mg | ORAL_CAPSULE | Freq: Every evening | ORAL | 2 refills | Status: DC | PRN

## 2016-06-13 MED ORDER — QUETIAPINE FUMARATE 200 MG PO TABS: 200.0000 mg | ORAL_TABLET | Freq: Every day | ORAL | 1 refills | Status: DC
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
## 2016-06-14 ENCOUNTER — Telehealth: Payer: Self-pay

## 2016-06-14 ENCOUNTER — Telehealth: Payer: Self-pay | Admitting: Internal Medicine

## 2016-06-14 NOTE — Telephone Encounter (Signed)
Pt calling stating yesterday she had an appointment with her  Physiatrist (in [place])  They took her BP she states it was 172/102 They advised her to call her PCP but she called [name]  Would like to know what needs to be done. Please advise

## 2016-06-14 NOTE — Telephone Encounter (Signed)
called to make sure that patient to make sure that she notified her pcp about her bp being high.  pt states she called but they told her to just monitor it.  pt states she has to go to [facility] to get checked.  pt was told that if she out this way she can stop by and I would check her bp for her.

## 2016-06-14 NOTE — Telephone Encounter (Signed)
Returned call to patient. Her BP was 178/102 yesterday at the psychatrist.  She denies having any headache, blurred vision, dizziness, chest pain, or SOB anytime lately. She thinks it was just a one time thing. She says she's been feeling fine. Patient does not have home BP cuff but will check it at the drug store occasionally and its always been normal. Encouraged patient to purchase her own BP cuff and keep a BP log. Patient said she will go to drug store tomorrow and check her BP and let us know what it is at that time. Patient currently takes Metoprolol tartrate 25 mg BID. Advised patient to call back if she has any of the above mentioned s/s of elevated BP and she verbalized understanding.

## 2016-06-15 NOTE — Telephone Encounter (Signed)
Spoke with patient and she denies any symptoms associated with low blood pressures and she states that she did it at the pharmacy. Instructed her to get a blood pressure machine for home use and to do a log so that we can see how they trend over time. In the absence of symptoms instructed her to continue monitoring and if she should develop any new symptoms or if blood pressures do not improve she should give [name] a call back so that we can see what we need to do. She verbalized understanding of our conversation, agreement with plan, and had no further questions at this time.

## 2016-06-15 NOTE — Telephone Encounter (Signed)
Pt calling to give [name] her BP reading for today 89/55 Hr 94  Please call if we have any concerns

## 2016-07-24 ENCOUNTER — Other Ambulatory Visit: Payer: Self-pay | Admitting: Family Medicine

## 2016-07-24 NOTE — Telephone Encounter (Signed)
[facility] faxed a request on the following medication. Thanks CC   metoprolol tartrate (LOPRESSOR) 25 MG tablet

## 2016-07-25 MED ORDER — METOPROLOL TARTRATE 25 MG PO TABS: 25.0000 mg | ORAL_TABLET | Freq: Two times a day (BID) | ORAL | 0 refills | Status: DC

## 2016-07-25 NOTE — Telephone Encounter (Signed)
Please advise patient that 90 day rf for metoprolol was sent to her mail-order, but it has been over a year since her last follow up appt and needs to  Schedule within the next month or two.

## 2016-09-03 ENCOUNTER — Other Ambulatory Visit: Payer: Self-pay

## 2016-09-03 ENCOUNTER — Telehealth: Payer: Self-pay

## 2016-09-03 MED ORDER — ATORVASTATIN CALCIUM 40 MG PO TABS: 40.0000 mg | ORAL_TABLET | Freq: Every day | ORAL | 0 refills | Status: DC

## 2016-09-03 NOTE — Telephone Encounter (Signed)
pt called states that she needs her seroquel sent to the new pharamacy [facility].  pt states that [facility] would not transfer additional refill to new pharmacy.  so she need new rx sent to new pharmacy.

## 2016-09-04 ENCOUNTER — Ambulatory Visit (INDEPENDENT_AMBULATORY_CARE_PROVIDER_SITE_OTHER): Payer: Managed Care, Other (non HMO) | Admitting: Family Medicine

## 2016-09-04 VITALS — BP 100/54 | HR 100 | Temp 98.7°F | Resp 18 | Wt 160.0 lb

## 2016-09-04 DIAGNOSIS — J0110 Acute frontal sinusitis, unspecified: Secondary | ICD-10-CM | POA: Diagnosis not present

## 2016-09-04 DIAGNOSIS — R05 Cough: Secondary | ICD-10-CM | POA: Diagnosis not present

## 2016-09-04 DIAGNOSIS — I2510 Atherosclerotic heart disease of native coronary artery without angina pectoris: Secondary | ICD-10-CM | POA: Diagnosis not present

## 2016-09-04 DIAGNOSIS — R059 Cough, unspecified: Secondary | ICD-10-CM

## 2016-09-04 MED ORDER — HYDROCODONE-HOMATROPINE 5-1.5 MG/5ML PO SYRP: 5.0000 mL | ORAL_SOLUTION | Freq: Three times a day (TID) | ORAL | 0 refills | Status: DC | PRN

## 2016-09-04 MED ORDER — AZITHROMYCIN 250 MG PO TABS: ORAL_TABLET | ORAL | 0 refills | Status: DC

## 2016-09-04 NOTE — Progress Notes (Signed)
Patient: Katelyn Cooper Female    DOB: December 18, 1955   61 y.o.   MRN: [id number] Visit Date: 09/04/2016  Today's Provider: Lelon Huh, MD   Chief Complaint  Patient presents with  . Cough   Subjective:    HPI  Patient started to feel bad on Friday June 1st. Symptoms are cough-productive with yellow phlegm, chest pain from coughing, shortness of breath, cough is keeping patient up at night and she can not lay flat down when sleeping, post nasal drip, sore throat on the left side, head pressure, headache, chills. No fever that she noticed. Patient has taking Advil as needed. Patient states her husband has similar symptoms and he was given cough syrup and antibiotic in case he got worse.    Allergies  Allergen Reactions  . Lovastatin     depression  . Paroxetine Hcl     itching  . Lamotrigine Rash     Current Outpatient Prescriptions:  .  aspirin EC 81 MG tablet, Take 1 tablet (81 mg total) by mouth daily., Disp: , Rfl:  .  atorvastatin (LIPITOR) 40 MG tablet, Take 1 tablet (40 mg total) by mouth daily., Disp: 90 tablet, Rfl: 0 .  Calcium Carb-Cholecalciferol (CALCIUM 1000 + D PO), Take by mouth. 2 daily, Disp: , Rfl:  .  Cholecalciferol (VITAMIN D3) 5000 units TABS, Take 1 tablet by mouth once a week., Disp: , Rfl: 0 .  desoximetasone (TOPICORT) 0.25 % cream, , Disp: , Rfl: 0 .  glucose blood test strip, 1 each by Other route as needed for other (ONE TOUCH ULTRA TEST STRIPS AND LANCETS). Use as instructed, Disp: , Rfl:  .  ibuprofen (ADVIL) 200 MG tablet, Take 400 mg by mouth every 6 (six) hours as needed for headache. , Disp: , Rfl:  .  lansoprazole (PREVACID) 30 MG capsule, Take 30 mg by mouth daily., Disp: , Rfl:  .  metoprolol tartrate (LOPRESSOR) 25 MG tablet, Take 1 tablet (25 mg total) by mouth 2 (two) times daily., Disp: 180 tablet, Rfl: 0 .  nitroGLYCERIN (NITROSTAT) 0.4 MG SL tablet, Place 1 tablet (0.4 mg total) under the tongue every 5 (five) minutes as needed for  chest pain., Disp: 30 tablet, Rfl: prn .  nystatin cream (MYCOSTATIN), APPLY TOPICALLY IF NEEDED FOR 14 DAYS, Disp: , Rfl: 0 .  QUEtiapine (SEROQUEL) 200 MG tablet, Take 1 tablet (200 mg total) by mouth at bedtime., Disp: 90 tablet, Rfl: 1 .  venlafaxine XR (EFFEXOR-XR) 75 MG 24 hr capsule, Take 1 capsule (75 mg total) by mouth daily with breakfast. Pr has supply, Disp: 90 capsule, Rfl: 1 .  zaleplon (SONATA) 10 MG capsule, Take 1 capsule (10 mg total) by mouth at bedtime as needed for sleep., Disp: 30 capsule, Rfl: 2 .  albuterol (PROAIR HFA) 108 (90 Base) MCG/ACT inhaler, ProAir HFA 90 mcg/actuation aerosol inhaler  inhale 2 puffs by mouth every 6 hours if needed for wheezing or shortness of breath, Disp: , Rfl:  .  traMADol (ULTRAM) 50 MG tablet, Take 1 tablet (50 mg total) by mouth every 6 (six) hours as needed (for back pain). (Patient not taking: Reported on 09/04/2016), Disp: 20 tablet, Rfl: 0  Current Facility-Administered Medications:  .  0.9 %  sodium chloride infusion, 500 mL, Intravenous, Continuous, Ladene Artist, MD  Review of Systems  Constitutional: Positive for chills and fatigue.  HENT: Positive for congestion, postnasal drip, sinus pressure, sore throat and voice change.  Head pressure  Respiratory: Positive for cough and shortness of breath.   Cardiovascular: Positive for chest pain (from coughing so much).  Musculoskeletal: Positive for arthralgias.  Neurological: Negative for dizziness.    Social History  Substance Use Topics  . Smoking status: Current Every Day Smoker    Packs/day: 1.00    Years: 40.00    Types: Cigarettes, E-cigarettes    Start date: 03/01/1975    Last attempt to quit: 10/05/2015  . Smokeless tobacco: Never Used     Comment: using patches  . Alcohol use No   Objective:   BP (!) 100/54   Pulse 100   Temp 98.7 F (37.1 C)   Resp 18   Wt 160 lb (72.6 kg)   SpO2 94%   BMI 28.34 kg/m  Vitals:   09/04/16 0855  BP: (!) 100/54    Pulse: 100  Resp: 18  Temp: 98.7 F (37.1 C)  SpO2: 94%  Weight: 160 lb (72.6 kg)     Physical Exam  General Appearance:    Alert, cooperative, no distress  HENT:   bilateral TM normal without fluid or infection, neck without nodes, throat normal without erythema or exudate, frontal and maxillary sinus tender and nasal mucosa pale and congested  Eyes:    PERRL, conjunctiva/corneas clear, EOM's intact       Lungs:     Occasional expiratory wheeze, no rales, , respirations unlabored  Heart:    Regular rate and rhythm  Neurologic:   Awake, alert, oriented x 3. No apparent focal neurological           defect.           Assessment & Plan:     1. Acute frontal sinusitis, recurrence not specified  - azithromycin (ZITHROMAX) 250 MG tablet; 2 by mouth today, then 1 daily for 4 days  Dispense: 6 tablet; Refill: 0  2. Cough  - HYDROcodone-homatropine (HYCODAN) 5-1.5 MG/5ML syrup; Take 5 mLs by mouth every 8 (eight) hours as needed for cough.  Dispense: 120 mL; Refill: 0  She is scheduled for chest CT tomorrow by Dr. Mortimer Fries.  Call if symptoms change or if not rapidly improving.          Lelon Huh, MD  [facility]

## 2016-09-05 ENCOUNTER — Ambulatory Visit
Admission: RE | Admit: 2016-09-05 | Discharge: 2016-09-05 | Disposition: A | Discharge status: Home / Self Care | Payer: Managed Care, Other (non HMO) | Source: Ambulatory Visit | Attending: Internal Medicine | Admitting: Internal Medicine

## 2016-09-05 DIAGNOSIS — R911 Solitary pulmonary nodule: Secondary | ICD-10-CM | POA: Diagnosis not present

## 2016-09-05 DIAGNOSIS — K760 Fatty (change of) liver, not elsewhere classified: Secondary | ICD-10-CM | POA: Diagnosis not present

## 2016-09-05 DIAGNOSIS — I2510 Atherosclerotic heart disease of native coronary artery without angina pectoris: Secondary | ICD-10-CM | POA: Diagnosis not present

## 2016-09-12 ENCOUNTER — Ambulatory Visit: Payer: 59 | Admitting: Psychiatry

## 2016-09-12 ENCOUNTER — Ambulatory Visit (INDEPENDENT_AMBULATORY_CARE_PROVIDER_SITE_OTHER): Payer: Managed Care, Other (non HMO) | Admitting: Internal Medicine

## 2016-09-12 ENCOUNTER — Encounter: Payer: Self-pay | Admitting: Internal Medicine

## 2016-09-12 VITALS — BP 102/60 | HR 78 | Resp 16 | Ht 63.0 in | Wt 159.0 lb

## 2016-09-12 DIAGNOSIS — R911 Solitary pulmonary nodule: Secondary | ICD-10-CM | POA: Diagnosis not present

## 2016-09-12 DIAGNOSIS — G4730 Sleep apnea, unspecified: Secondary | ICD-10-CM | POA: Diagnosis not present

## 2016-09-12 DIAGNOSIS — Z720 Tobacco use: Secondary | ICD-10-CM | POA: Diagnosis not present

## 2016-09-12 MED ORDER — VARENICLINE TARTRATE 1 MG PO TABS: 1.0000 mg | ORAL_TABLET | Freq: Two times a day (BID) | ORAL | 1 refills | Status: DC

## 2016-09-12 MED ORDER — VARENICLINE TARTRATE 0.5 MG X 11 & 1 MG X 42 PO MISC: ORAL | 0 refills | Status: DC

## 2016-09-12 NOTE — Progress Notes (Signed)
[facility] Consultation     Date: 09/12/2016,   MRN# [id number] Ahlivia Salahuddin 11/15/1955 Code Status:  Code Status History    This patient does not have a recorded code status. Please follow your organizational policy for patients in this situation.     Hosp day:@LENGTHOFSTAYDAYS @ Referring MD: @ATDPROV @     PCP:      AdmissionWeight: 159 lb (72.1 kg)                 CurrentWeight: 159 lb (72.1 kg) Reyanna Baley is a 61 y.o. old female seen in consultation for cough, pleuritic chest pain. Previous BQ patient     CHIEF COMPLAINT:   Follow up cough,wheezing 2 weeks ago   HISTORY OF PRESENT ILLNESS   Patient  Follow up Sleep study  AHI down 5.8 with >90% compliance >4 hrs per night  Patient had signs and symptoms of acute bronchitis and upper restrained infection 2 weeks ago Was given Z-Pak for therapy Patient had complete resolution of symptoms  For her OSA, Patient has more energy, feels better, fatigue has improved No more frequent naps Patient is doing great on her current CPAP therapy for sleep apnea  Patient still smokes patient has tried nicotine patches and has failed patient would like to retry Chantix   PFT's 08/2015 reviewed with patient ratio 80% Fev1 2.2 L 87% FVC 2.6L 83%  Ct chest 08/30/15 Left lung Nodule approx 6 MM increased from 2-3 MM from 2013.    CT chest 12/2015 Left Lung nodule approx 6 MM-no change from previous CT scans   CT chest 09/05/16 There is a right lower lobe small opacity probable mucus plugging mucoid impaction The left lower lobe nodule has not increased in size significantly from previous CAT scan  I have reviewed the CT scan findings with patient  Current Medication:   Current Outpatient Prescriptions:  .  albuterol (PROAIR HFA) 108 (90 Base) MCG/ACT inhaler, ProAir HFA 90 mcg/actuation aerosol inhaler  inhale 2 puffs by mouth every 6 hours if needed for wheezing or shortness of breath, Disp: , Rfl:  .   aspirin EC 81 MG tablet, Take 1 tablet (81 mg total) by mouth daily., Disp: , Rfl:  .  atorvastatin (LIPITOR) 40 MG tablet, Take 1 tablet (40 mg total) by mouth daily., Disp: 90 tablet, Rfl: 0 .  Calcium Carb-Cholecalciferol (CALCIUM 1000 + D PO), Take by mouth. 2 daily, Disp: , Rfl:  .  Cholecalciferol (VITAMIN D3) 5000 units TABS, Take 1 tablet by mouth once a week., Disp: , Rfl: 0 .  desoximetasone (TOPICORT) 0.25 % cream, , Disp: , Rfl: 0 .  glucose blood test strip, 1 each by Other route as needed for other (ONE TOUCH ULTRA TEST STRIPS AND LANCETS). Use as instructed, Disp: , Rfl:  .  HYDROcodone-homatropine (HYCODAN) 5-1.5 MG/5ML syrup, Take 5 mLs by mouth every 8 (eight) hours as needed for cough., Disp: 120 mL, Rfl: 0 .  ibuprofen (ADVIL) 200 MG tablet, Take 400 mg by mouth every 6 (six) hours as needed for headache. , Disp: , Rfl:  .  lansoprazole (PREVACID) 30 MG capsule, Take 30 mg by mouth daily., Disp: , Rfl:  .  metoprolol tartrate (LOPRESSOR) 25 MG tablet, Take 1 tablet (25 mg total) by mouth 2 (two) times daily., Disp: 180 tablet, Rfl: 0 .  nitroGLYCERIN (NITROSTAT) 0.4 MG SL tablet, Place 1 tablet (0.4 mg total) under the tongue every 5 (five) minutes as needed for chest pain.,  Disp: 30 tablet, Rfl: prn .  nystatin cream (MYCOSTATIN), APPLY TOPICALLY IF NEEDED FOR 14 DAYS, Disp: , Rfl: 0 .  QUEtiapine (SEROQUEL) 200 MG tablet, Take 1 tablet (200 mg total) by mouth at bedtime., Disp: 90 tablet, Rfl: 1 .  traMADol (ULTRAM) 50 MG tablet, Take 1 tablet (50 mg total) by mouth every 6 (six) hours as needed (for back pain)., Disp: 20 tablet, Rfl: 0 .  venlafaxine XR (EFFEXOR-XR) 75 MG 24 hr capsule, Take 1 capsule (75 mg total) by mouth daily with breakfast. Pr has supply, Disp: 90 capsule, Rfl: 1 .  zaleplon (SONATA) 10 MG capsule, Take 1 capsule (10 mg total) by mouth at bedtime as needed for sleep., Disp: 30 capsule, Rfl: 2  Current Facility-Administered Medications:  .  0.9 %  sodium  chloride infusion, 500 mL, Intravenous, Continuous, Ladene Artist, MD     ALLERGIES   Lovastatin; Paroxetine hcl; and Lamotrigine     REVIEW OF SYSTEMS   Review of Systems  Constitutional: Negative for chills, fever, malaise/fatigue and weight loss.  HENT: Negative for congestion.   Respiratory: Negative for cough, hemoptysis, sputum production, shortness of breath and wheezing.   Cardiovascular: Negative for chest pain, palpitations, orthopnea and leg swelling.  Gastrointestinal: Negative for nausea.  All other systems reviewed and are negative.   BP 102/60 (BP Location: Right Arm, Patient Position: Sitting, Cuff Size: Normal)   Pulse 78   Resp 16   Ht 5\' 3"  (1.6 m)   Wt 159 lb (72.1 kg)   SpO2 94%   BMI 28.17 kg/m     PHYSICAL EXAM   Physical Exam  Constitutional: She is oriented to person, place, and time. No distress.  HENT:  Mouth/Throat: No oropharyngeal exudate.  Cardiovascular: Normal rate, regular rhythm and normal heart sounds.   No murmur heard. Pulmonary/Chest: Effort normal and breath sounds normal. No stridor. No respiratory distress. She has no wheezes. She has no rales.  Musculoskeletal: Normal range of motion. She exhibits no edema.  Neurological: She is alert and oriented to person, place, and time. No cranial nerve deficit.  Skin: Skin is warm. She is not diaphoretic.  Psychiatric: She has a normal mood and affect.        ASSESSMENT/PLAN   61 yo white female with signs and symptoms of intermittent reactive airways disease from vaping, quit tobacoo abuse 10/2015 But has restarted smoking, patient with  left lung solitary pulm nodule that is stable over past 6 months. Patient also has a diagnosis of sleep apnea    Her OSA has imprived with therapy, her reactive airways disease is stable. No acute issues at this time   1.Intermittent reactive airways disease from tobacco abuse -Recommend smoking cessation -Chantix starter pack and  continuing Chantix for 1 month prescription provided -Albuterol as needed-  2repeat CT chest in 9 months for interval changes -LLL nodule is benign at this time   3.smoking/Vaping  cessation strongly advised  4.continue CPAP therapy for OSA -AHI down to 5.8  Follow up 9 months   Patient are satisfied with Plan of action and management. All questions answered   [name], M.D.  [facility]  Medical Director [place] Director [facility] Cardio-Pulmonary Department

## 2016-09-12 NOTE — Patient Instructions (Signed)
STOP SMOKING!!! START CHANTIX  CT chest in 9 months

## 2016-09-13 ENCOUNTER — Telehealth: Payer: Self-pay

## 2016-09-13 NOTE — Telephone Encounter (Signed)
Patient called saying that over the last 3 days her blood sugars have been running high. She reports that in the past her blood sugars have always been stable (in the 120s, with a HbgA1c of 6.0). She reports that since the beginning of this week her blood sugars have been anywhere between 220-300. She denies any changes in her diet. She has not been on any steroids. She does mention being on an antibiotic for an sinus infection last week.   She reports that she is starting to feel really fatigued and she has occasional dizzy spells that only last a few seconds. Patient is wanting to know what should she do for her blood sugars? Please advise. Thanks!

## 2016-09-13 NOTE — Telephone Encounter (Signed)
Avoid sweets and starchy foods and schedule o.v. Within the next week.

## 2016-09-13 NOTE — Telephone Encounter (Signed)
Patient was notified. Patient scheduled appt for 09/20/2016 at 8:45 am.

## 2016-09-14 ENCOUNTER — Telehealth: Payer: Self-pay | Admitting: Psychiatry

## 2016-09-18 ENCOUNTER — Encounter: Payer: Self-pay | Admitting: Internal Medicine

## 2016-09-18 ENCOUNTER — Ambulatory Visit (INDEPENDENT_AMBULATORY_CARE_PROVIDER_SITE_OTHER): Payer: Managed Care, Other (non HMO) | Admitting: Internal Medicine

## 2016-09-18 VITALS — BP 126/62 | HR 85 | Ht 63.0 in | Wt 158.8 lb

## 2016-09-18 DIAGNOSIS — E785 Hyperlipidemia, unspecified: Secondary | ICD-10-CM | POA: Diagnosis not present

## 2016-09-18 DIAGNOSIS — I739 Peripheral vascular disease, unspecified: Secondary | ICD-10-CM | POA: Diagnosis not present

## 2016-09-18 DIAGNOSIS — I25118 Atherosclerotic heart disease of native coronary artery with other forms of angina pectoris: Secondary | ICD-10-CM

## 2016-09-18 NOTE — Progress Notes (Signed)
Follow-up Outpatient Visit Date: 09/18/2016  Chief Complaint: Follow-up chest pain and nonobstructive coronary artery disease.  HPI:  Katelyn Cooper is a 61 y.o. year-old female with history of diet controlled diabetes mellitus, PVD with prior left iliac stent in the 1990s, thoracic outlet syndrome status post bilateral rib resections in 1610, GERD complicated by esophageal stricture, hyperlipidemia, and anxiety/depression, who presents for follow-up of chest pain. I first met the patient on 12/12/15 for evaluation of chest tightness, dyspnea, and fatigue. Subsequent workup revealed normal LVEF by echo and myocardial perfusion stress test with a small reversible apical and apical anterior defect. She continued to have symptoms despite medical therapy with metoprolol and isosorbide mononitrate. We ultimately proceeded with coronary angiography on 01/12/16, which revealed mild, nonobstructive CAD in the setting of sluggish flow through the coronaries that may reflect microvascular dysfunction.  Today, Katelyn Cooper reports feeling well. She has not had any further chest pain since her last visit, with the exception of chest wall discomfort with a cough this spring. She denies shortness of breath, palpitations, claudication, and edema. She began using CPAP a few months ago and has noticed a significant improvement in her fatigue. She continues to smoke but is hoping to stop with the assistance of Chantix this summer. She notes that her blood pressure is occasionally borderline low. She has brief lightheadedness at times, though she has not felt like passing out or lost consciousness.  --------------------------------------------------------------------------------------------------  Cardiovascular History & Procedures: Cardiovascular Problems:  Nonobstructive coronary artery disease  Peripheral vascular status post left iliac stent  Risk Factors:  Diabetes mellitus, hyperlipidemia, peripheral vascular  disease  Cath/PCI:  LHC (01/12/16): LMCA and LAD normal. There is a 40% stenosis in the proximal aspect of the large first septal perforator. There is 30% OM1 stenosis. 30% proximal and mid RCA lesions are also present. LVEDP mildly elevated at 15-20 mmHg.  CV Surgery:  Remote left iliac stent.  EP Procedures and Devices:  None.  Non-Invasive Evaluation(s):  Pharmacologic myocardial perfusion stress test (12/23/15): Small, mild area of reversible defect involving the apical anterior and apical segments that could reflect ischemia or breast attenuation. LVEF 55-65%. Borderline TID was noted.  Transthoracic echocardiogram (12/28/15): Normal LV systolic and diastolic function (EF 96-04%). No significant valvular abnormalities. Normal RV size and function.  Aortoiliac and lower extremity Dopplers: Patent stent in the left common iliac artery. No significant abnormalities. ABI right 1.05, left 1.03.  Recent CV Pertinent Labs: Lab Results  Component Value Date   INR 1.06 01/06/2016   K 4.4 01/06/2016   BUN 14 01/06/2016   BUN 9 08/10/2015   CREATININE 1.13 (H) 01/06/2016   Past medical and surgical history were reviewed and updated in Epic.  Outpatient Encounter Prescriptions as of 09/18/2016  Medication Sig  . albuterol (PROAIR HFA) 108 (90 Base) MCG/ACT inhaler ProAir HFA 90 mcg/actuation aerosol inhaler  inhale 2 puffs by mouth every 6 hours if needed for wheezing or shortness of breath  . aspirin EC 81 MG tablet Take 1 tablet (81 mg total) by mouth daily.  [name] atorvastatin (LIPITOR) 40 MG tablet Take 1 tablet (40 mg total) by mouth daily.  . Calcium Carb-Cholecalciferol (CALCIUM 1000 + D PO) Take by mouth. 2 daily  . Cholecalciferol (VITAMIN D3) 5000 units TABS Take 1 tablet by mouth daily.   [name] desoximetasone (TOPICORT) 0.25 % cream   . glucose blood test strip 1 each by Other route as needed for other (ONE TOUCH ULTRA TEST STRIPS AND LANCETS).  Use as instructed  . ibuprofen  (ADVIL) 200 MG tablet Take 400 mg by mouth every 6 (six) hours as needed for headache.   . lansoprazole (PREVACID) 30 MG capsule Take 30 mg by mouth daily.  . metoprolol tartrate (LOPRESSOR) 25 MG tablet Take 1 tablet (25 mg total) by mouth 2 (two) times daily.  . nitroGLYCERIN (NITROSTAT) 0.4 MG SL tablet Place 1 tablet (0.4 mg total) under the tongue every 5 (five) minutes as needed for chest pain.  [name] nystatin cream (MYCOSTATIN) APPLY TOPICALLY IF NEEDED FOR 14 DAYS  . QUEtiapine (SEROQUEL) 200 MG tablet Take 1 tablet (200 mg total) by mouth at bedtime.  . traMADol (ULTRAM) 50 MG tablet Take 1 tablet (50 mg total) by mouth every 6 (six) hours as needed (for back pain).  [name] venlafaxine XR (EFFEXOR-XR) 75 MG 24 hr capsule Take 1 capsule (75 mg total) by mouth daily with breakfast. Pr has supply  . zaleplon (SONATA) 10 MG capsule Take 1 capsule (10 mg total) by mouth at bedtime as needed for sleep.  . varenicline (CHANTIX CONTINUING MONTH PAK) 1 MG tablet Take 1 tablet (1 mg total) by mouth 2 (two) times daily. (Patient not taking: Reported on 09/18/2016)  . varenicline (CHANTIX STARTING MONTH PAK) 0.5 MG X 11 & 1 MG X 42 tablet Take one 0.5 mg tablet by mouth once daily for 3 days, then increase to one 0.5 mg tablet twice daily for 4 days, then increase to one 1 mg tablet twice daily. (Patient not taking: Reported on 09/18/2016)  . [DISCONTINUED] HYDROcodone-homatropine (HYCODAN) 5-1.5 MG/5ML syrup Take 5 mLs by mouth every 8 (eight) hours as needed for cough. (Patient not taking: Reported on 09/18/2016)   Facility-Administered Encounter Medications as of 09/18/2016  Medication  . 0.9 %  sodium chloride infusion    Allergies: Lovastatin; Paroxetine hcl; and Lamotrigine  Social History   Social History  . Marital status: Married    Spouse name: N/A  . Number of children: 0  . Years of education: N/A   Occupational History  . 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 Smoking status:  Current Every Day Smoker    Packs/day: 1.00    Years: 40.00    Types: Cigarettes, E-cigarettes    Start date: 03/01/1975    Last attempt to quit: 10/05/2015  . Smokeless tobacco: Never Used     Comment: using patches  . Alcohol use No  . Drug use: No  . Sexual activity: Not Currently   Other Topics Concern  . Not on file   Social History Narrative  . No narrative on file    Family History  Problem Relation Age of Onset  . Lung cancer Mother        was a smoker  . Emphysema Mother   . Cancer Mother        Lung  . Alcohol abuse Mother   . Depression Mother   . Heart disease Mother   . Drug abuse Sister   . Breast cancer Sister   . Emphysema Sister   . Bipolar disorder Sister   . Alcohol abuse Other   . Depression Other   . Bipolar disorder Other   . Heart disease Other   . Vision loss Other   . Alcohol abuse Father   . Mood Disorder Father   . Heart disease Maternal Grandmother   . Stroke Maternal Grandmother   . Esophageal cancer Maternal Aunt   .  Colon cancer Neg Hx     Review of Systems: A 12-system review of systems was performed and was negative except as noted in the HPI.  --------------------------------------------------------------------------------------------------  Physical Exam: BP 126/62 (BP Location: Left Arm, Patient Position: Sitting, Cuff Size: Normal)   Pulse 85   Ht _0  (1.6 m)   Wt 158 lb 12 oz (72 kg)   BMI 28.12 kg/m   General:  Overweight woman, seated comfortably in the exam room. HEENT: No conjunctival pallor or scleral icterus.  Moist mucous membranes.  OP clear. Neck: Supple without lymphadenopathy, thyromegaly, JVD, or HJR. Lungs: Normal work of breathing.  Clear to auscultation bilaterally without wheezes or crackles. Heart: Regular rate and rhythm without murmurs, rubs, or gallops.  Non-displaced PMI. Abd: Bowel sounds present.  Soft, NT/ND without hepatosplenomegaly Ext: No lower extremity edema.  Radial, PT, and DP pulses  are 2+ bilaterally. Skin: Warm and dry without rash.  EKG:  Normal sinus rhythm with nonspecific ST/T changes. No significant change from prior tracing on 02/08/16.  Lab Results  Component Value Date   WBC 6.7 01/06/2016   HGB 12.5 01/06/2016   HCT 36.1 01/06/2016   MCV 88.5 01/06/2016   PLT 174 01/06/2016    Lab Results  Component Value Date   NA 137 01/06/2016   K 4.4 01/06/2016   CL 104 01/06/2016   CO2 27 01/06/2016   BUN 14 01/06/2016   CREATININE 1.13 (H) 01/06/2016   GLUCOSE 138 (H) 01/06/2016   ALT 16 12/10/2013   No results found for: CHOL, HDL, LDLCALC, LDLDIRECT, TRIG, CHOLHDL  --------------------------------------------------------------------------------------------------  ASSESSMENT AND PLAN: Coronary artery disease with stable angina Patient has not had any further episodes of chest pain with low-dose metoprolol. Though she notes her blood pressure at times is borderline low, she seems to be tolerating the medication well. Given that her cardiac catheterization last year showed mild to moderate, nonobstructive CAD, we will continue with her current regimen. If angina worsens, ranolazine might be a good option for Ms. Luczak, as she has been intolerant of long-acting nitrates. I have encouraged her to proceed with smoking cessation.  Peripheral vascular disease Ms. Funes does not have any symptoms to suggest worsening PVD. Her pedal pulses are normal today. We will continue with secondary prevention.  Hyperlipidemia Goal LDL remains less than 70, given nonobstructive CAD and history of PAD. She is scheduled to follow-up with her PCP, Dr. Caryn Section, later this week. I suggest that he check a lipid panel as part of her labs to ensure that her LDL is at goal. We will continue her current dose of atorvastatin.  Follow-up: Return to clinic in 1 year.  Nelva Bush, MD 09/19/2016 7:48 AM

## 2016-09-18 NOTE — Patient Instructions (Signed)
Medication Instructions:  Your physician recommends that you continue on your current medications as directed. Please refer to the Current Medication list given to you today.   Labwork: none  Testing/Procedures: none  Follow-Up: Your physician wants you to follow-up in: [place]. You will receive a reminder letter in the mail two months in advance. If you don't receive a letter, please call our office to schedule the follow-up appointment.  If you need a refill on your cardiac medications before your next appointment, please call your pharmacy.

## 2016-09-19 ENCOUNTER — Ambulatory Visit (INDEPENDENT_AMBULATORY_CARE_PROVIDER_SITE_OTHER): Payer: 59 | Admitting: Psychiatry

## 2016-09-19 ENCOUNTER — Encounter: Payer: Self-pay | Admitting: Psychiatry

## 2016-09-19 VITALS — BP 108/64 | HR 90 | Temp 98.4°F | Wt 158.4 lb

## 2016-09-19 DIAGNOSIS — F4310 Post-traumatic stress disorder, unspecified: Secondary | ICD-10-CM | POA: Diagnosis not present

## 2016-09-19 DIAGNOSIS — E785 Hyperlipidemia, unspecified: Secondary | ICD-10-CM | POA: Insufficient documentation

## 2016-09-19 DIAGNOSIS — E1169 Type 2 diabetes mellitus with other specified complication: Secondary | ICD-10-CM | POA: Insufficient documentation

## 2016-09-19 DIAGNOSIS — F3160 Bipolar disorder, current episode mixed, unspecified: Secondary | ICD-10-CM | POA: Diagnosis not present

## 2016-09-19 DIAGNOSIS — I739 Peripheral vascular disease, unspecified: Secondary | ICD-10-CM | POA: Insufficient documentation

## 2016-09-19 MED ORDER — QUETIAPINE FUMARATE 200 MG PO TABS: 200.0000 mg | ORAL_TABLET | Freq: Every day | ORAL | 1 refills | Status: DC

## 2016-09-19 MED ORDER — VENLAFAXINE HCL ER 75 MG PO CP24: 75.0000 mg | ORAL_CAPSULE | Freq: Every day | ORAL | 1 refills | Status: DC

## 2016-09-19 MED ORDER — QUETIAPINE FUMARATE 200 MG PO TABS: 200.0000 mg | ORAL_TABLET | Freq: Every day | ORAL | 0 refills | Status: DC

## 2016-09-19 MED ORDER — ZALEPLON 10 MG PO CAPS: 10.0000 mg | ORAL_CAPSULE | Freq: Every evening | ORAL | 2 refills | Status: DC | PRN

## 2016-09-19 NOTE — Telephone Encounter (Signed)
Pt was seen today. Pt had appt on  09-19-16.
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
## 2016-09-20 ENCOUNTER — Encounter: Payer: Self-pay | Admitting: Family Medicine

## 2016-09-20 ENCOUNTER — Ambulatory Visit (INDEPENDENT_AMBULATORY_CARE_PROVIDER_SITE_OTHER): Payer: Managed Care, Other (non HMO) | Admitting: Family Medicine

## 2016-09-20 VITALS — BP 90/56 | HR 72 | Temp 98.0°F | Resp 16 | Wt 160.0 lb

## 2016-09-20 DIAGNOSIS — E119 Type 2 diabetes mellitus without complications: Secondary | ICD-10-CM | POA: Diagnosis not present

## 2016-09-20 DIAGNOSIS — E785 Hyperlipidemia, unspecified: Secondary | ICD-10-CM

## 2016-09-20 DIAGNOSIS — I2510 Atherosclerotic heart disease of native coronary artery without angina pectoris: Secondary | ICD-10-CM

## 2016-09-20 DIAGNOSIS — Z720 Tobacco use: Secondary | ICD-10-CM

## 2016-09-20 DIAGNOSIS — Z1159 Encounter for screening for other viral diseases: Secondary | ICD-10-CM | POA: Diagnosis not present

## 2016-09-20 NOTE — Progress Notes (Signed)
Patient: Katelyn Cooper Female    DOB: [birth year]   61 y.o.   MRN: [id number] Visit Date: 09/20/2016  Today's Provider: Lelon Huh, MD   Chief Complaint  Patient presents with  . Diabetes    follow up  . Hyperlipidemia    follow up   Subjective:    HPI  Diabetes Mellitus Type II, Follow-up:   No results found for: HGBA1C  Last seen for diabetes more than 1 years ago.  Management since then includes no changes . She reports poor compliance with treatment. She is not having side effects.  Current symptoms include polydipsia and have been stable. Home blood sugar records: fasting range: 239  Episodes of hypoglycemia? no   Current Insulin Regimen: none Most Recent Eye Exam: <1 year ago Weight trend: stable Prior visit with dietician: no Current diet: in general, an "unhealthy" diet Current exercise: none  Pertinent Labs:    Component Value Date/Time   CREATININE 1.13 (H) 01/06/2016 0912    Wt Readings from Last 3 Encounters:  09/18/16 158 lb 12 oz (72 kg)  09/12/16 159 lb (72.1 kg)  09/04/16 160 lb (72.6 kg)    ------------------------------------------------------------------------  Lipid/Cholesterol, Follow-up:   Last seen for this1 years ago.  Management changes since that visit include none. Patient was recently seen by her Cardiologist 2 days ago and was advised to follow up with her PCP to have lipids checked.  . Last Lipid Panel: No results found for: CHOL, TRIG, HDL, CHOLHDL, VLDL, LDLCALC, LDLDIRECT  Risk factors for vascular disease include diabetes mellitus and hypercholesterolemia  She reports good compliance with treatment. She is not having side effects.  Current symptoms include none and have been stable. Weight trend: stable Prior visit with dietician: no Current diet: in general, an "unhealthy" diet Current exercise: none  Wt Readings from Last 3 Encounters:  09/18/16 158 lb 12 oz (72 kg)  09/12/16 159 lb (72.1 kg)  09/04/16  160 lb (72.6 kg)    -------------------------------------------------------------------  Smoking She is working on trying to quit smoking again. She has been using nicotine patch which didn't help. Dr. Mortimer Fries recently prescribed Chantix which she plans on starting in the next couple of weeks.   Follow up CAD. She recently had follow up with Dr. Rockey Situ which went well and she is not due for follow up for a year. She has had no recent chest pains, palpitations, or heart flutters.     Allergies  Allergen Reactions  . Lovastatin     depression  . Paroxetine Hcl     itching  . Lamotrigine Rash     Current Outpatient Prescriptions:  .  albuterol (PROAIR HFA) 108 (90 Base) MCG/ACT inhaler, ProAir HFA 90 mcg/actuation aerosol inhaler  inhale 2 puffs by mouth every 6 hours if needed for wheezing or shortness of breath, Disp: , Rfl:  .  aspirin EC 81 MG tablet, Take 1 tablet (81 mg total) by mouth daily., Disp: , Rfl:  .  atorvastatin (LIPITOR) 40 MG tablet, Take 1 tablet (40 mg total) by mouth daily., Disp: 90 tablet, Rfl: 0 .  Calcium Carb-Cholecalciferol (CALCIUM 1000 + D PO), Take by mouth. 2 daily, Disp: , Rfl:  .  Cholecalciferol (VITAMIN D3) 5000 units TABS, Take 1 tablet by mouth daily. , Disp: , Rfl: 0 .  desoximetasone (TOPICORT) 0.25 % cream, , Disp: , Rfl: 0 .  glucose blood test strip, 1 each by Other route as needed for other (  ONE TOUCH ULTRA TEST STRIPS AND LANCETS). Use as instructed, Disp: , Rfl:  .  ibuprofen (ADVIL) 200 MG tablet, Take 400 mg by mouth every 6 (six) hours as needed for headache. , Disp: , Rfl:  .  lansoprazole (PREVACID) 30 MG capsule, Take 30 mg by mouth daily., Disp: , Rfl:  .  metoprolol tartrate (LOPRESSOR) 25 MG tablet, Take 1 tablet (25 mg total) by mouth 2 (two) times daily., Disp: 180 tablet, Rfl: 0 .  nitroGLYCERIN (NITROSTAT) 0.4 MG SL tablet, Place 1 tablet (0.4 mg total) under the tongue every 5 (five) minutes as needed for chest pain., Disp: 30  tablet, Rfl: prn .  nystatin cream (MYCOSTATIN), APPLY TOPICALLY IF NEEDED FOR 14 DAYS, Disp: , Rfl: 0 .  QUEtiapine (SEROQUEL) 200 MG tablet, Take 1 tablet (200 mg total) by mouth at bedtime., Disp: 30 tablet, Rfl: 0 .  traMADol (ULTRAM) 50 MG tablet, Take 1 tablet (50 mg total) by mouth every 6 (six) hours as needed (for back pain)., Disp: 20 tablet, Rfl: 0 .  varenicline (CHANTIX CONTINUING MONTH PAK) 1 MG tablet, Take 1 tablet (1 mg total) by mouth 2 (two) times daily., Disp: 60 tablet, Rfl: 1 .  varenicline (CHANTIX STARTING MONTH PAK) 0.5 MG X 11 & 1 MG X 42 tablet, Take one 0.5 mg tablet by mouth once daily for 3 days, then increase to one 0.5 mg tablet twice daily for 4 days, then increase to one 1 mg tablet twice daily., Disp: 53 tablet, Rfl: 0 .  venlafaxine XR (EFFEXOR-XR) 75 MG 24 hr capsule, Take 1 capsule (75 mg total) by mouth daily with breakfast., Disp: 90 capsule, Rfl: 1 .  venlafaxine XR (EFFEXOR-XR) 75 MG 24 hr capsule, Take 1 capsule (75 mg total) by mouth daily with breakfast. Pr has supply, Disp: 90 capsule, Rfl: 1 .  zaleplon (SONATA) 10 MG capsule, Take 1 capsule (10 mg total) by mouth at bedtime as needed for sleep., Disp: 30 capsule, Rfl: 2  Current Facility-Administered Medications:  .  0.9 %  sodium chloride infusion, 500 mL, Intravenous, Continuous, Ladene Artist, MD  Review of Systems  Constitutional: Negative for appetite change, chills, fatigue and fever.  Respiratory: Negative for chest tightness and shortness of breath.   Cardiovascular: Negative for chest pain and palpitations.  Gastrointestinal: Negative for abdominal pain, nausea and vomiting.  Endocrine: Positive for polydipsia.  Neurological: Negative for dizziness and weakness.    Social History  Substance Use Topics  . Smoking status: Current Every Day Smoker    Packs/day: 1.00    Years: 40.00    Types: Cigarettes    Start date: 03/01/1975    Last attempt to quit: 10/05/2015  . Smokeless  tobacco: Never Used     Comment: using patches  . Alcohol use No   Objective:   BP (!) 90/56 (BP Location: Left Arm, Cuff Size: Normal)   Pulse 72   Temp 98 F (36.7 C) (Oral)   Resp 16   Wt 160 lb (72.6 kg)   SpO2 95% Comment: room air  BMI 28.34 kg/m  Vitals:   09/20/16 0851 09/20/16 0855  BP: (!) 98/58 (!) 90/56  Pulse: 72   Resp: 16   Temp: 98 F (36.7 C)   TempSrc: Oral   SpO2: 95%   Weight: 160 lb (72.6 kg)      Physical Exam  General appearance: alert, well developed, well nourished, cooperative and in no distress Head: Normocephalic, without obvious abnormality,  atraumatic Respiratory: Respirations even and unlabored, normal respiratory rate Extremities: No gross deformities Skin: Skin color, texture, turgor normal. No rashes seen  Psych: Appropriate mood and affect. Neurologic: Mental status: Alert, oriented to person, place, and time, thought content appropriate.     Assessment & Plan:     1. Atherosclerosis of native coronary artery of native heart without angina pectoris Asymptomatic. Compliant with medication.  Continue aggressive risk factor modification.   - Comprehensive metabolic panel - Lipid panel  2. Need for hepatitis C screening test  - Hepatitis C antibody  3. Type 2 diabetes mellitus without complication, without long-term current use of insulin (HCC) Home sugars have been running high, currently on no medications.  - Hemoglobin A1c  4. Hyperlipidemia LDL goal <70 She is tolerating atorvastatin well with no adverse effects.    5. Tobacco abuse Anticipating starting chantix in the next couple of weeks.    The entirety of the information documented in the History of Present Illness, Review of Systems and Physical Exam were personally obtained by me. Portions of this information were initially documented by April M. Sabra Heck, CMA and reviewed by me for thoroughness and accuracy.        Lelon Huh, MD  [facility]

## 2016-09-22 LAB — LIPID PANEL
CHOLESTEROL TOTAL: 148 mg/dL (ref 100–199)
Chol/HDL Ratio: 3.7 ratio (ref 0.0–4.4)
HDL: 40 mg/dL (ref >39)
LDL CALC: 73 mg/dL (ref 0–99)
Triglycerides: 175 mg/dL — ABNORMAL HIGH (ref 0–149)
VLDL Cholesterol Cal: 35 mg/dL (ref 5–40)

## 2016-09-22 LAB — HEMOGLOBIN A1C
ESTIMATED AVERAGE GLUCOSE: 140 mg/dL
Hgb A1c MFr Bld: 6.5 % — ABNORMAL HIGH (ref 4.8–5.6)

## 2016-09-22 LAB — COMPREHENSIVE METABOLIC PANEL
ALBUMIN: 4.1 g/dL (ref 3.6–4.8)
ALK PHOS: 160 IU/L — AB (ref 39–117)
ALT: 22 IU/L (ref 0–32)
AST: 27 IU/L (ref 0–40)
Albumin/Globulin Ratio: 1.3 (ref 1.2–2.2)
BUN / CREAT RATIO: 8 — AB (ref 12–28)
BUN: 9 mg/dL (ref 8–27)
Bilirubin Total: 0.3 mg/dL (ref 0.0–1.2)
CO2: 22 mmol/L (ref 20–29)
CREATININE: 1.08 mg/dL — AB (ref 0.57–1.00)
Calcium: 9.4 mg/dL (ref 8.7–10.3)
Chloride: 100 mmol/L (ref 96–106)
GFR calc Af Amer: 64 mL/min/{1.73_m2} (ref >59)
GFR calc non Af Amer: 55 mL/min/{1.73_m2} — ABNORMAL LOW (ref >59)
GLOBULIN, TOTAL: 3.1 g/dL (ref 1.5–4.5)
Glucose: 113 mg/dL — ABNORMAL HIGH (ref 65–99)
Potassium: 4.5 mmol/L (ref 3.5–5.2)
SODIUM: 138 mmol/L (ref 134–144)
Total Protein: 7.2 g/dL (ref 6.0–8.5)

## 2016-09-22 LAB — HEPATITIS C ANTIBODY

## 2016-10-09 ENCOUNTER — Encounter: Payer: Self-pay | Admitting: Family Medicine

## 2016-10-09 ENCOUNTER — Ambulatory Visit (INDEPENDENT_AMBULATORY_CARE_PROVIDER_SITE_OTHER): Payer: Managed Care, Other (non HMO) | Admitting: Family Medicine

## 2016-10-09 VITALS — BP 108/60 | HR 92 | Temp 98.2°F | Resp 16 | Wt 159.0 lb

## 2016-10-09 DIAGNOSIS — J40 Bronchitis, not specified as acute or chronic: Secondary | ICD-10-CM

## 2016-10-09 DIAGNOSIS — J301 Allergic rhinitis due to pollen: Secondary | ICD-10-CM

## 2016-10-09 MED ORDER — PREDNISONE 10 MG PO TABS: ORAL_TABLET | ORAL | 0 refills | Status: AC

## 2016-10-09 MED ORDER — AZITHROMYCIN 250 MG PO TABS: ORAL_TABLET | ORAL | 0 refills | Status: AC

## 2016-10-09 NOTE — Progress Notes (Signed)
Patient: Katelyn Cooper Female    DOB: 1955/11/08   61 y.o.   MRN: [id number] Visit Date: 10/09/2016  Today's Provider: Lelon Huh, MD   Chief Complaint  Patient presents with  . Cough   Subjective:    Cough  This is a recurrent (she sees Dr. Mortimer Fries, pulmonology for abnormal lung imaging and reactive airway dx. Last CT was 09/05/2016, and showed possible infection of right middle lobe. Was untreated due to recent abx use for sinusitis.) problem. Episode onset: x 2 weeks. The problem has been unchanged. The cough is non-productive. Associated symptoms include myalgias (due to coughing), shortness of breath and wheezing (slight). Pertinent negatives include no chest pain, chills, ear congestion, ear pain, fever, headaches, heartburn, hemoptysis, nasal congestion, postnasal drip, rhinorrhea, sore throat, sweats or weight loss. The symptoms are aggravated by lying down. Treatments tried: Mucinex, albuterol inhaler, Delsym. Improvement on treatment: moderate short term relief.   She was treated for sinusitis with cough a month ago with azithromycin and quickly improved. Current cough started two weeks ago, after her last visit with Dr. Mortimer Fries. She is also having persistent clear nasal drainage and congestion.       Allergies  Allergen Reactions  . Lovastatin     depression  . Paroxetine Hcl     itching  . Lamotrigine Rash     Current Outpatient Prescriptions:  .  albuterol (PROAIR HFA) 108 (90 Base) MCG/ACT inhaler, ProAir HFA 90 mcg/actuation aerosol inhaler  inhale 2 puffs by mouth every 6 hours if needed for wheezing or shortness of breath, Disp: , Rfl:  .  aspirin EC 81 MG tablet, Take 1 tablet (81 mg total) by mouth daily., Disp: , Rfl:  .  atorvastatin (LIPITOR) 40 MG tablet, Take 1 tablet (40 mg total) by mouth daily., Disp: 90 tablet, Rfl: 0 .  Calcium Carb-Cholecalciferol (CALCIUM 1000 + D PO), Take by mouth. 2 daily, Disp: , Rfl:  .  Cholecalciferol (VITAMIN D3) 5000 units  TABS, Take 1 tablet by mouth daily. , Disp: , Rfl: 0 .  desoximetasone (TOPICORT) 0.25 % cream, , Disp: , Rfl: 0 .  glucose blood test strip, 1 each by Other route as needed for other (ONE TOUCH ULTRA TEST STRIPS AND LANCETS). Use as instructed, Disp: , Rfl:  .  ibuprofen (ADVIL) 200 MG tablet, Take 400 mg by mouth every 6 (six) hours as needed for headache. , Disp: , Rfl:  .  lansoprazole (PREVACID) 30 MG capsule, Take 30 mg by mouth daily., Disp: , Rfl:  .  metoprolol tartrate (LOPRESSOR) 25 MG tablet, Take 1 tablet (25 mg total) by mouth 2 (two) times daily., Disp: 180 tablet, Rfl: 0 .  nitroGLYCERIN (NITROSTAT) 0.4 MG SL tablet, Place 1 tablet (0.4 mg total) under the tongue every 5 (five) minutes as needed for chest pain., Disp: 30 tablet, Rfl: prn .  nystatin cream (MYCOSTATIN), APPLY TOPICALLY IF NEEDED FOR 14 DAYS, Disp: , Rfl: 0 .  QUEtiapine (SEROQUEL) 200 MG tablet, Take 1 tablet (200 mg total) by mouth at bedtime., Disp: 30 tablet, Rfl: 0 .  venlafaxine XR (EFFEXOR-XR) 75 MG 24 hr capsule, Take 1 capsule (75 mg total) by mouth daily with breakfast. Pr has supply, Disp: 90 capsule, Rfl: 1 .  zaleplon (SONATA) 10 MG capsule, Take 1 capsule (10 mg total) by mouth at bedtime as needed for sleep., Disp: 30 capsule, Rfl: 2 .  traMADol (ULTRAM) 50 MG tablet, Take 1 tablet (50  mg total) by mouth every 6 (six) hours as needed (for back pain). (Patient not taking: Reported on 10/09/2016), Disp: 20 tablet, Rfl: 0 .  varenicline (CHANTIX CONTINUING MONTH PAK) 1 MG tablet, Take 1 tablet (1 mg total) by mouth 2 (two) times daily. (Patient not taking: Reported on 10/09/2016), Disp: 60 tablet, Rfl: 1 .  varenicline (CHANTIX STARTING MONTH PAK) 0.5 MG X 11 & 1 MG X 42 tablet, Take one 0.5 mg tablet by mouth once daily for 3 days, then increase to one 0.5 mg tablet twice daily for 4 days, then increase to one 1 mg tablet twice daily. (Patient not taking: Reported on 10/09/2016), Disp: 53 tablet, Rfl: 0  Review  of Systems  Constitutional: Negative for chills, fever and weight loss.  HENT: Negative for ear pain, postnasal drip, rhinorrhea and sore throat.   Respiratory: Positive for cough, shortness of breath and wheezing (slight). Negative for hemoptysis.   Cardiovascular: Negative for chest pain.  Gastrointestinal: Negative for heartburn.  Musculoskeletal: Positive for myalgias (due to coughing).  Neurological: Negative for headaches.    Social History  Substance Use Topics  . Smoking status: Current Every Day Smoker    Packs/day: 1.00    Years: 40.00    Types: Cigarettes    Start date: 03/01/1975    Last attempt to quit: 10/05/2015  . Smokeless tobacco: Never Used     Comment: using patches. 1 ppd since 1976  . Alcohol use No   Objective:   BP 108/60 (BP Location: Right Arm, Patient Position: Sitting, Cuff Size: Normal)   Pulse 92   Temp 98.2 F (36.8 C) (Oral)   Resp 16   Wt 159 lb (72.1 kg)   SpO2 93%   BMI 28.17 kg/m     Physical Exam  General Appearance:    Alert, cooperative, no distress  HENT:   bilateral TM normal without fluid or infection, left TM fluid noted, sinuses nontender and nasal mucosa pale and congested  Eyes:    PERRL, conjunctiva/corneas clear, EOM's intact       Lungs:     Occasional expiratory wheeze, no rales, , respirations unlabored  Heart:    Regular rate and rhythm  Neurologic:   Awake, alert, oriented x 3. No apparent focal neurological           defect.           Assessment & Plan:     1. Bronchitis  - azithromycin (ZITHROMAX) 250 MG tablet; 2 by mouth today, then 1 daily for 4 days  Dispense: 6 tablet; Refill: 0  2. Allergic rhinitis due to pollen, unspecified seasonality  - predniSONE (DELTASONE) 10 MG tablet; 6 tablets for 1 day, then 5 for 1 day, then 4 for 1 day, then 3 for 1 day, then 2 for 1 day then 1 for 1 day.  Dispense: 21 tablet; Refill: 0  Call if symptoms change or if not rapidly improving.           Lelon Huh,  MD  [facility]

## 2016-10-15 ENCOUNTER — Other Ambulatory Visit: Payer: Self-pay | Admitting: Family Medicine

## 2016-12-17 ENCOUNTER — Ambulatory Visit: Payer: 59 | Admitting: Psychiatry

## 2016-12-31 ENCOUNTER — Telehealth: Payer: Self-pay | Admitting: Internal Medicine

## 2016-12-31 ENCOUNTER — Ambulatory Visit (INDEPENDENT_AMBULATORY_CARE_PROVIDER_SITE_OTHER): Payer: 59 | Admitting: Psychiatry

## 2016-12-31 ENCOUNTER — Other Ambulatory Visit: Payer: Self-pay | Admitting: *Deleted

## 2016-12-31 ENCOUNTER — Encounter: Payer: Self-pay | Admitting: Psychiatry

## 2016-12-31 VITALS — BP 106/68 | HR 99 | Temp 98.7°F | Wt 168.0 lb

## 2016-12-31 DIAGNOSIS — F4310 Post-traumatic stress disorder, unspecified: Secondary | ICD-10-CM

## 2016-12-31 DIAGNOSIS — F3160 Bipolar disorder, current episode mixed, unspecified: Secondary | ICD-10-CM | POA: Diagnosis not present

## 2016-12-31 MED ORDER — VENLAFAXINE HCL ER 75 MG PO CP24
75.0000 mg | ORAL_CAPSULE | Freq: Every day | ORAL | 1 refills | Status: DC
Start: 2016-12-31 — End: 2017-03-04

## 2016-12-31 MED ORDER — ATORVASTATIN CALCIUM 40 MG PO TABS: 40.0000 mg | ORAL_TABLET | Freq: Every day | ORAL | 2 refills | Status: DC

## 2016-12-31 MED ORDER — QUETIAPINE FUMARATE 200 MG PO TABS: 200.0000 mg | ORAL_TABLET | Freq: Every day | ORAL | 1 refills | Status: DC

## 2016-12-31 MED ORDER — ZALEPLON 10 MG PO CAPS: 10.0000 mg | ORAL_CAPSULE | Freq: Every evening | ORAL | 2 refills | Status: DC | PRN
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
## 2016-12-31 NOTE — Telephone Encounter (Signed)
Requested Prescriptions   Signed Prescriptions Disp Refills  . atorvastatin (LIPITOR) 40 MG tablet 90 tablet 2    Sig: Take 1 tablet (40 mg total) by mouth daily.    Authorizing Provider: END, CHRISTOPHER    Ordering User: Britt Bottom

## 2016-12-31 NOTE — Telephone Encounter (Signed)
°*  STAT* If patient is at the pharmacy, call can be transferred to refill team.   1. Which medications need to be refilled? (please list name of each medication and dose if known)  Generic for Lipitor   2. Which pharmacy/location (including street and city if local pharmacy) is medication to be sent to? [facility]   3. Do they need a 30 day or 90 day supply? 90 day

## 2017-01-23 ENCOUNTER — Encounter: Payer: Self-pay | Admitting: Gastroenterology

## 2017-01-23 ENCOUNTER — Ambulatory Visit (INDEPENDENT_AMBULATORY_CARE_PROVIDER_SITE_OTHER): Payer: Managed Care, Other (non HMO) | Admitting: Gastroenterology

## 2017-01-23 VITALS — BP 100/60 | HR 68 | Ht 63.0 in | Wt 167.0 lb

## 2017-01-23 DIAGNOSIS — K219 Gastro-esophageal reflux disease without esophagitis: Secondary | ICD-10-CM

## 2017-01-23 MED ORDER — PANTOPRAZOLE SODIUM 40 MG PO TBEC: 40.0000 mg | DELAYED_RELEASE_TABLET | Freq: Two times a day (BID) | ORAL | 2 refills | Status: DC

## 2017-01-23 NOTE — Progress Notes (Signed)
Reviewed and agree with management plan.  Jahliyah Trice T. Kariya Lavergne, MD FACG 

## 2017-01-23 NOTE — Progress Notes (Signed)
01/23/2017 Katelyn Cooper [id number] 17-Mar-1956   HISTORY OF PRESENT ILLNESS: This is a pleasant 61 year old female who is known to Dr. Fuller Plan for reflux issues.  She is here today with complaints of the same.  She tells me that she has been on Prevacid 30 mg daily for several years and was doing very well.  Then, about 3 weeks ago she started having severe reflux.  She says that it starts in the morning and just seems to get worse throughout the day.  It has even made her vomit from the acid and the burning.  She says that she can feel acid and burning all the way into her throat.  She tried increasing her Prevacid to twice a day only for about a week and says that she did not notice any difference.  She then switched to Zantac twice daily and that has not seemed to help much either.  She is unsure if this is 75 or 150 mg.  Her last EGD was in June 2013 at which time she was found to have a stricture at the GE junction, mild gastritis, mild duodenal, and a small hiatal hernia.   Past Medical History:  Diagnosis Date  . Anxiety   . Depression   . Diabetes type 2, controlled ([place])    diet-controlled  . Esophageal stricture   . GERD (gastroesophageal reflux disease)   . Hiatal hernia   . History of chicken pox   . Hyperlipidemia   . Internal hemorrhoids   . Panic disorder    Past Surgical History:  Procedure Laterality Date  . [place]   stent placement; ? left iliac artery intervention  . APPENDECTOMY  1998  . CARDIAC CATHETERIZATION N/A 01/12/2016   Procedure: Left Heart Cath and Coronary Angiography;  Surgeon: Nelva Bush, MD;  Location: [facility];  Service: Cardiovascular;  Laterality: N/A;  . FOOT SURGERY     Right  . pap smear  03/2010   Done by [name], normal  . REFRACTIVE SURGERY     right  . RIB RESECTION  [id number]  . SHOULDER SURGERY  2005   left  . UPPER GI ENDOSCOPY  09/19/2011   Stricture at GE junction, dilated. Mild gastritis  and duodenitis. Small hiatal hernia    reports that she quit smoking about 3 months ago. Her smoking use included Cigarettes. She started smoking about 41 years ago. She has a 40.00 pack-year smoking history. She has never used smokeless tobacco. She reports that she does not drink alcohol or use drugs. family history includes Alcohol abuse in her father, mother, and other; Bipolar disorder in her other and sister; Breast cancer in her sister; Cancer in her mother; Depression in her mother and other; Drug abuse in her sister; Emphysema in her mother and sister; Esophageal cancer in her maternal aunt; Heart disease in her maternal grandmother, mother, and other; Lung cancer in her mother; Mood Disorder in her father; Stroke in her maternal grandmother; Vision loss in her other. Allergies  Allergen Reactions  . Lovastatin     depression  . Paroxetine Hcl     itching  . Lamotrigine Rash      Outpatient Encounter Prescriptions as of 01/23/2017  Medication Sig  . albuterol (PROAIR HFA) 108 (90 Base) MCG/ACT inhaler ProAir HFA 90 mcg/actuation aerosol inhaler  inhale 2 puffs by mouth every 6 hours if needed for wheezing or shortness of breath  . aspirin EC 81  MG tablet Take 1 tablet (81 mg total) by mouth daily.  [name] atorvastatin (LIPITOR) 40 MG tablet Take 1 tablet (40 mg total) by mouth daily.  . Calcium Carb-Cholecalciferol (CALCIUM 1000 + D PO) Take by mouth. 2 daily  . Cholecalciferol (VITAMIN D3) 5000 units TABS Take 1 tablet by mouth daily.   [name] desoximetasone (TOPICORT) 0.25 % cream   . glucose blood test strip 1 each by Other route as needed for other (ONE TOUCH ULTRA TEST STRIPS AND LANCETS). Use as instructed  . ibuprofen (ADVIL) 200 MG tablet Take 400 mg by mouth every 6 (six) hours as needed for headache.   . metoprolol tartrate (LOPRESSOR) 25 MG tablet TAKE 1 TABLET (25 MG TOTAL) BY MOUTH TWICE DAILY  . nitroGLYCERIN (NITROSTAT) 0.4 MG SL tablet Place 1 tablet (0.4 mg total) under the  tongue every 5 (five) minutes as needed for chest pain.  [name] nystatin cream (MYCOSTATIN) APPLY TOPICALLY IF NEEDED FOR 14 DAYS  . QUEtiapine (SEROQUEL) 200 MG tablet Take 1 tablet (200 mg total) by mouth at bedtime.  . ranitidine (ZANTAC) 150 MG tablet Take 150 mg by mouth 2 (two) times daily.  [name] venlafaxine XR (EFFEXOR-XR) 75 MG 24 hr capsule Take 1 capsule (75 mg total) by mouth daily with breakfast.  . zaleplon (SONATA) 10 MG capsule Take 1 capsule (10 mg total) by mouth at bedtime as needed for sleep.  . [DISCONTINUED] traMADol (ULTRAM) 50 MG tablet Take 1 tablet (50 mg total) by mouth every 6 (six) hours as needed (for back pain).  . [DISCONTINUED] varenicline (CHANTIX CONTINUING MONTH PAK) 1 MG tablet Take 1 tablet (1 mg total) by mouth 2 (two) times daily.  . [DISCONTINUED] varenicline (CHANTIX STARTING MONTH PAK) 0.5 MG X 11 & 1 MG X 42 tablet Take one 0.5 mg tablet by mouth once daily for 3 days, then increase to one 0.5 mg tablet twice daily for 4 days, then increase to one 1 mg tablet twice daily.  . [DISCONTINUED] lansoprazole (PREVACID) 30 MG capsule Take 30 mg by mouth daily.   No facility-administered encounter medications on file as of 01/23/2017.      REVIEW OF SYSTEMS  : All other systems reviewed and negative except where noted in the History of Present Illness.   PHYSICAL EXAM: BP 100/60   Pulse 68   Ht 5\' 3"  (1.6 m)   Wt 167 lb (75.8 kg)   BMI 29.58 kg/m  General: Well developed white female in no acute distress Head: Normocephalic and atraumatic Eyes:  Sclerae anicteric, conjunctiva pink. Ears: Normal auditory acuity Lungs: Clear throughout to auscultation; no increased WOB. Heart: Regular rate and rhythm; no M/R/G. Abdomen: Soft, non-distended.  BS present.  Non-tender. Musculoskeletal: Symmetrical with no gross deformities  Skin: No lesions on visible extremities Extremities: No edema  Neurological: Alert oriented x 4, grossly non-focal Psychological:  Alert  and cooperative. Normal mood and affect  ASSESSMENT AND PLAN: *Refractory GERD: I am going to change her PPI to pantoprazole 40 mg twice daily for now.  She can also take Zantac at bedtime if needed.  We will schedule her for EGD with Dr. Fuller Plan.  GERD dietary guidelines discussed and paperwork given.  **The risks, benefits, and alternatives to EGD were discussed with the patient and she consents to proceed.    CC:  [name], MD

## 2017-01-23 NOTE — Patient Instructions (Signed)
You have been scheduled for an endoscopy. Please follow written instructions given to you at your visit today. If you use inhalers (even only as needed), please bring them with you on the day of your procedure. Your physician has requested that you go to www.startemmi.com and enter the access code given to you at your visit today. This web site gives a general overview about your procedure. However, you should still follow specific instructions given to you by our office regarding your preparation for the procedure.  We have sent the following medications to your pharmacy for you to pick up at your convenience: Pantoprazole 40 mg twice a day  Continue Zantac 75 mg or 150 mg at bedtime as well.   We have given you a GERD handout with dietary guidelines. Please strive to adhere to these guidelines.

## 2017-01-24 ENCOUNTER — Telehealth: Payer: Self-pay | Admitting: Physician Assistant

## 2017-01-25 NOTE — Telephone Encounter (Signed)
Spoke to patient prior auth started on [email or web address]. She verbalized understanding.

## 2017-01-29 NOTE — Telephone Encounter (Signed)
Let's just do pantoprazole 40 mg once daily (and hope that just the change in medication will help her since she had been on the prevacid for years).  She can then take the zantac at dinner AND bedtime if needed until her EGD.  Thank you,  Jess

## 2017-01-29 NOTE — Telephone Encounter (Signed)
Insurance will not cover Pantoprazole for twice a day. Please advise.

## 2017-01-29 NOTE — Telephone Encounter (Signed)
Spoke to patient and informed her of the medication changes. She agrees and verbalized understanding.

## 2017-02-28 ENCOUNTER — Encounter: Payer: Self-pay | Admitting: Gastroenterology

## 2017-03-04 ENCOUNTER — Ambulatory Visit (INDEPENDENT_AMBULATORY_CARE_PROVIDER_SITE_OTHER): Payer: 59 | Admitting: Psychiatry

## 2017-03-04 ENCOUNTER — Other Ambulatory Visit: Payer: Self-pay

## 2017-03-04 ENCOUNTER — Encounter: Payer: Self-pay | Admitting: Psychiatry

## 2017-03-04 VITALS — BP 104/66 | HR 106 | Temp 98.0°F | Wt 168.8 lb

## 2017-03-04 DIAGNOSIS — F3160 Bipolar disorder, current episode mixed, unspecified: Secondary | ICD-10-CM

## 2017-03-04 DIAGNOSIS — F4310 Post-traumatic stress disorder, unspecified: Secondary | ICD-10-CM

## 2017-03-04 MED ORDER — QUETIAPINE FUMARATE 200 MG PO TABS: 200.0000 mg | ORAL_TABLET | Freq: Every day | ORAL | 1 refills | Status: DC

## 2017-03-04 MED ORDER — ZALEPLON 10 MG PO CAPS: 10.0000 mg | ORAL_CAPSULE | Freq: Every evening | ORAL | 2 refills | Status: DC | PRN

## 2017-03-04 MED ORDER — VENLAFAXINE HCL ER 75 MG PO CP24: 75.0000 mg | ORAL_CAPSULE | Freq: Every day | ORAL | 1 refills | Status: DC
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
## 2017-03-05 ENCOUNTER — Encounter: Payer: Self-pay | Admitting: Gastroenterology

## 2017-03-05 ENCOUNTER — Other Ambulatory Visit: Payer: Self-pay

## 2017-03-05 ENCOUNTER — Ambulatory Visit (AMBULATORY_SURGERY_CENTER): Payer: Managed Care, Other (non HMO) | Admitting: Gastroenterology

## 2017-03-05 VITALS — BP 102/61 | HR 75 | Temp 97.5°F | Resp 15 | Ht 63.0 in | Wt 167.0 lb

## 2017-03-05 DIAGNOSIS — K219 Gastro-esophageal reflux disease without esophagitis: Secondary | ICD-10-CM | POA: Diagnosis present

## 2017-03-05 DIAGNOSIS — K2900 Acute gastritis without bleeding: Secondary | ICD-10-CM

## 2017-03-05 DIAGNOSIS — K2950 Unspecified chronic gastritis without bleeding: Secondary | ICD-10-CM

## 2017-03-05 MED ORDER — SODIUM CHLORIDE 0.9 % IV SOLN: 500.0000 mL | INTRAVENOUS | Status: DC

## 2017-03-05 NOTE — Progress Notes (Signed)
Called to room to assist during endoscopic procedure.  Patient ID and intended procedure confirmed with present staff. Received instructions for my participation in the procedure from the performing physician.  

## 2017-03-05 NOTE — Patient Instructions (Signed)
YOU HAD AN ENDOSCOPIC PROCEDURE TODAY AT [facility]:   Refer to the procedure report that was given to you for any specific questions about what was found during the examination.  If the procedure report does not answer your questions, please call your gastroenterologist to clarify.  If you requested that your care partner not be given the details of your procedure findings, then the procedure report has been included in a sealed envelope for you to review at your convenience later.  YOU SHOULD EXPECT: Some feelings of bloating in the abdomen. Passage of more gas than usual.  Walking can help get rid of the air that was put into your GI tract during the procedure and reduce the bloating. If you had a lower endoscopy (such as a colonoscopy or flexible sigmoidoscopy) you may notice spotting of blood in your stool or on the toilet paper. If you underwent a bowel prep for your procedure, you may not have a normal bowel movement for a few days.  Please Note:  You might notice some irritation and congestion in your nose or some drainage.  This is from the oxygen used during your procedure.  There is no need for concern and it should clear up in a day or so.  SYMPTOMS TO REPORT IMMEDIATELY:   Following upper endoscopy (EGD)  Vomiting of blood or coffee ground material  New chest pain or pain under the shoulder blades  Painful or persistently difficult swallowing  New shortness of breath  Fever of 100F or higher  Black, tarry-looking stools  For urgent or emergent issues, a gastroenterologist can be reached at any hour by calling [phone].   DIET:  We do recommend a small meal at first, but then you may proceed to your regular diet.  Drink plenty of fluids but you should avoid alcoholic beverages for 24 hours.  ACTIVITY:  You should plan to take it easy for the rest of today and you should NOT DRIVE or use heavy machinery until tomorrow (because of the sedation medicines used  during the test).    FOLLOW UP: Our staff will call the number listed on your records the next business day following your procedure to check on you and address any questions or concerns that you may have regarding the information given to you following your procedure. If we do not reach you, we will leave a message.  However, if you are feeling well and you are not experiencing any problems, there is no need to return our call.  We will assume that you have returned to your regular daily activities without incident.  If any biopsies were taken you will be contacted by phone or by letter within the next 1-3 weeks.  Please call us at [phone] if you have not heard about the biopsies in 3 weeks.   Await for biopsy results  .Marland KitchenNo ibuprofen, naproxen or other non-steroidal anti-inflammatory drugs. Follow up office visit in 2 months with Dr. Fuller Plan, office will call to make the appointment.  SIGNATURES/CONFIDENTIALITY: You and/or your care partner have signed paperwork which will be entered into your electronic medical record.  These signatures attest to the fact that that the information above on your After Visit Summary has been reviewed and is understood.  Full responsibility of the confidentiality of this discharge information lies with you and/or your care-partner.

## 2017-03-05 NOTE — Op Note (Signed)
[facility] Patient Name: Katelyn Cooper Procedure Date: 03/05/2017 10:20 AM MRN: [id number] Endoscopist: Ladene Artist , MD Age: 61 Referring MD:  Date of Birth: December 16, 1955 Gender: Female Account #: [id number] Procedure:                Upper GI endoscopy Indications:              Epigastric abdominal pain, Gastroesophageal reflux                            disease Medicines:                Monitored Anesthesia Care Procedure:                Pre-Anesthesia Assessment:                           - Prior to the procedure, a History and Physical                            was performed, and patient medications and                            allergies were reviewed. The patient's tolerance of                            previous anesthesia was also reviewed. The risks                            and benefits of the procedure and the sedation                            options and risks were discussed with the patient.                            All questions were answered, and informed consent                            was obtained. Prior Anticoagulants: The patient has                            taken no previous anticoagulant or antiplatelet                            agents. ASA Grade Assessment: II - A patient with                            mild systemic disease. After reviewing the risks                            and benefits, the patient was deemed in                            satisfactory condition to undergo the procedure.  After obtaining informed consent, the endoscope was                            passed under direct vision. Throughout the                            procedure, the patient's blood pressure, pulse, and                            oxygen saturations were monitored continuously. The                            Model GIF-H180 [id number]) scope was introduced                            through the mouth, and advanced to the second  part                            of duodenum. The upper GI endoscopy was                            accomplished without difficulty. The patient                            tolerated the procedure well. Scope In: Scope Out: Findings:                 The examined esophagus was normal.                           A few localized, small non-bleeding erosions were                            found in the gastric antrum and in the prepyloric                            region of the stomach. There were no stigmata of                            recent bleeding. Biopsies were taken with a cold                            forceps for histology.                           A small amount of food (residue) was found on the                            greater curvature of the gastric body.                           The exam of the stomach was otherwise normal.                           The  duodenal bulb and second portion of the                            duodenum were normal. Complications:            No immediate complications. Estimated Blood Loss:     Estimated blood loss was minimal. Impression:               - Normal esophagus.                           - Non-bleeding erosive gastropathy. Biopsied.                           - A small amount of food (residue) in the stomach.                           - Normal duodenal bulb and second portion of the                            duodenum. Recommendation:           - Patient has a contact number available for                            emergencies. The signs and symptoms of potential                            delayed complications were discussed with the                            patient. Return to normal activities tomorrow.                            Written discharge instructions were provided to the                            patient.                           - Resume previous diet.                           - Follow antireflux measures.                            - Continue present medications.                           - Await pathology results.                           - GI office follow up in 2 months.                           - No aspirin, ibuprofen, naproxen, or other  non-steroidal anti-inflammatory drugs. Ladene Artist, MD 03/05/2017 10:34:34 AM This report has been signed electronically.

## 2017-03-05 NOTE — Progress Notes (Signed)
Report given to PACU, vss 

## 2017-03-06 ENCOUNTER — Telehealth: Payer: Self-pay

## 2017-03-06 ENCOUNTER — Telehealth: Payer: Self-pay | Admitting: *Deleted

## 2017-03-06 NOTE — Telephone Encounter (Signed)
  Follow up Call-  Call back number 03/05/2017 01/27/2016  Post procedure Call Back phone  # [phone]  Permission to leave phone message Yes Yes  Some recent data might be hidden     Patient questions:  Do you have a fever, pain , or abdominal swelling? No. Pain Score  0 *  Have you tolerated food without any problems? Yes.    Have you been able to return to your normal activities? Yes.    Do you have any questions about your discharge instructions: Diet   No. Medications  No. Follow up visit  No.  Do you have questions or concerns about your Care? No.  Actions: * If pain score is 4 or above: No action needed, pain <4. Pt verbalize last night she felt like she was having a hard time breathing. Verbalize she said she had a hard time catching her breath. Verbalize today she is feeling better and has return to normal. Pt is not voicing any concerns at this time , just wanted to make you aware.

## 2017-03-06 NOTE — Telephone Encounter (Signed)
Spoken with pt.,  per Dr. Fuller Plan  Advised if she should experience anymore breathing problems sge should follow up with her PCP promptly. Pt verbalize that she does have a pulmonary doctor that she sees. Pt denies any concerns at this time.

## 2017-03-06 NOTE — Telephone Encounter (Signed)
Message left

## 2017-03-06 NOTE — Telephone Encounter (Signed)
If she has any more difficult breathing she should promptly contact her PCP.

## 2017-03-13 ENCOUNTER — Encounter: Payer: Self-pay | Admitting: Gastroenterology

## 2017-04-10 ENCOUNTER — Encounter (INDEPENDENT_AMBULATORY_CARE_PROVIDER_SITE_OTHER): Payer: Self-pay | Admitting: Orthopedic Surgery

## 2017-04-10 ENCOUNTER — Ambulatory Visit (INDEPENDENT_AMBULATORY_CARE_PROVIDER_SITE_OTHER): Payer: Managed Care, Other (non HMO) | Admitting: Orthopedic Surgery

## 2017-04-10 ENCOUNTER — Ambulatory Visit (INDEPENDENT_AMBULATORY_CARE_PROVIDER_SITE_OTHER): Payer: Managed Care, Other (non HMO)

## 2017-04-10 DIAGNOSIS — M25562 Pain in left knee: Secondary | ICD-10-CM

## 2017-04-10 DIAGNOSIS — M19011 Primary osteoarthritis, right shoulder: Secondary | ICD-10-CM | POA: Diagnosis not present

## 2017-04-12 ENCOUNTER — Encounter (INDEPENDENT_AMBULATORY_CARE_PROVIDER_SITE_OTHER): Payer: Self-pay | Admitting: Orthopedic Surgery

## 2017-04-12 DIAGNOSIS — M19011 Primary osteoarthritis, right shoulder: Secondary | ICD-10-CM

## 2017-04-12 MED ORDER — METHYLPREDNISOLONE ACETATE 40 MG/ML IJ SUSP
13.3300 mg | INTRAMUSCULAR | Status: AC | PRN
  Administered 2017-04-12: 13.33 mg via INTRA_ARTICULAR

## 2017-04-12 MED ORDER — BUPIVACAINE HCL 0.25 % IJ SOLN
0.6600 mL | INTRAMUSCULAR | Status: AC | PRN
  Administered 2017-04-12: .66 mL via INTRA_ARTICULAR

## 2017-04-12 MED ORDER — LIDOCAINE HCL 1 % IJ SOLN
3.0000 mL | INTRAMUSCULAR | Status: AC | PRN
  Administered 2017-04-12: 3 mL

## 2017-04-12 NOTE — Progress Notes (Signed)
Office Visit Note   Patient: Katelyn Cooper           Date of Birth: 1956-03-19           MRN: [id number] Visit Date: 04/10/2017 Requested by: [name], MD [address] [place],  [address] PCP: [name], MD  Subjective: Chief Complaint  Patient presents with  . Right Shoulder - Follow-up  . Left Knee - Pain    HPI: Katelyn Cooper is a patient with right shoulder pain.  She has had injections into the [facility] joint in the past.  Last one done in August 2017 and it worked well.  She reports recurrent pain in the right shoulder and request another injection today.  Patient also describes left knee pain.  She noticed going from sitting to standing and also reports some difficulty with stairs.  Left knee pain is been going on for 4 months.  Denies any mechanical symptoms.              ROS: All systems reviewed are negative as they relate to the chief complaint within the history of present illness.  Patient denies  fevers or chills.   Assessment & Plan: Visit Diagnoses:  1. Left knee pain, unspecified chronicity   2. Arthritis of right acromioclavicular joint     Plan: Impression is left knee pain with possible degenerative meniscal tear.  No effusion and normal radiographs are present.  This is something we can watch for now.  In regards to the right shoulder she has recurrent no AC joint arthritis in that area is injected today under ultrasound.  I will see her back as needed  Follow-Up Instructions: Return if symptoms worsen or fail to improve.   Orders:  Orders Placed This Encounter  Procedures  . XR KNEE 3 VIEW LEFT   No orders of the defined types were placed in this encounter.     Procedures: Medium Joint Inj: R acromioclavicular on 04/12/2017 8:11 PM Indications: diagnostic evaluation and pain Details: 25 G 1.5 in needle, ultrasound-guided superior approach Medications: 3 mL lidocaine 1 %; 0.66 mL bupivacaine 0.25 %; 13.33 mg methylPREDNISolone  acetate 40 MG/ML Outcome: tolerated well, no immediate complications Procedure, treatment alternatives, risks and benefits explained, specific risks discussed. Consent was given by the patient. Immediately prior to procedure a time out was called to verify the correct patient, procedure, equipment, support staff and site/side marked as required. Patient was prepped and draped in the usual sterile fashion.       Clinical Data: No additional findings.  Objective: Vital Signs: There were no vitals taken for this visit.  Physical Exam:   Constitutional: Patient appears well-developed HEENT:  Head: Normocephalic Eyes:EOM are normal Neck: Normal range of motion Cardiovascular: Normal rate Pulmonary/chest: Effort normal Neurologic: Patient is alert Skin: Skin is warm Psychiatric: Patient has normal mood and affect    Ortho Exam: Orthopedic exam demonstrates tenderness to palpation of the [facility] joint on the right with pain with crossed arm adduction.  Rotator cuff strength intact.  No loss of passive range of motion present.  Negative apprehension relocation testing.  Negative impingement signs.  Negative O'Brien's testing.  Left knee is examined.  Full range of motion present.  No effusion.  Mild patellofemoral crepitus is present.  Collateral and cruciate ligaments are stable.  No other masses lymph adenopathy or skin changes noted in the left knee region.  Specialty Comments:  No specialty comments available.  Imaging: No results found.  PMFS History: Patient Active Problem List   Diagnosis Date Noted  . Peripheral vascular disease ([place]) 09/19/2016  . Hyperlipidemia LDL goal <70 09/19/2016  . Coronary atherosclerosis 09/05/2016  . Abnormal stress test 01/12/2016  . Pleuritic chest pain 07/28/2015  . Duodenitis 01/05/2015  . Dermatitis, nummular 01/05/2015  . Allergic rhinitis 12/23/2014  . Anxiety 12/23/2014  . Ataxia 12/23/2014  . Back ache 12/23/2014  . Cervical  muscle strain 12/23/2014  . Depression 12/23/2014  . Eczema 12/23/2014  . GERD (gastroesophageal reflux disease) 12/23/2014  . History of anemia 12/23/2014  . Pure hypercholesterolemia 12/23/2014  . Episodic mood disorder ([place]) 12/23/2014  . Nephropyelitis 12/23/2014  . Situational stress 12/23/2014  . Tremor 12/23/2014  . Vitamin D deficiency 11/03/2013  . Diabetes mellitus type 2, controlled ([place]) 08/13/2011  . Cough 05/10/2011  . Chest pain 05/10/2011  . Tobacco abuse 05/10/2011   Past Medical History:  Diagnosis Date  . Anxiety   . Depression   . Diabetes type 2, controlled ([place])    diet-controlled  . Esophageal stricture   . GERD (gastroesophageal reflux disease)   . Hiatal hernia   . History of chicken pox   . Hyperlipidemia   . Internal hemorrhoids   . Panic disorder     Family History  Problem Relation Age of Onset  . Lung cancer Mother        was a smoker  . Emphysema Mother   . Cancer Mother        Lung  . Alcohol abuse Mother   . Depression Mother   . Heart disease Mother   . Drug abuse Sister   . Breast cancer Sister   . Emphysema Sister   . Bipolar disorder Sister   . Alcohol abuse Other   . Depression Other   . Bipolar disorder Other   . Heart disease Other   . Vision loss Other   . Alcohol abuse Father   . Mood Disorder Father   . Heart disease Maternal Grandmother   . Stroke Maternal Grandmother   . Esophageal cancer Maternal Aunt   . Colon cancer Neg Hx     Past Surgical History:  Procedure Laterality Date  . [place]   stent placement; ? left iliac artery intervention  . APPENDECTOMY  1998  . CARDIAC CATHETERIZATION N/A 01/12/2016   Procedure: Left Heart Cath and Coronary Angiography;  Surgeon: Nelva Bush, MD;  Location: [facility];  Service: Cardiovascular;  Laterality: N/A;  . FOOT SURGERY     Right  . pap smear  03/2010   Done by [name], normal  . REFRACTIVE SURGERY     right  . RIB RESECTION   [id number]  . SHOULDER SURGERY  2005   left  . UPPER GI ENDOSCOPY  09/19/2011   Stricture at GE junction, dilated. Mild gastritis and duodenitis. Small hiatal hernia   Social History   Occupational History  . Occupation: computer-retired    Employer: [employer]  Tobacco Use  . Smoking status: Former Smoker    Packs/day: 1.00    Years: 40.00    Pack years: 40.00    Types: Cigarettes    Start date: 03/01/1975    Last attempt to quit: 10/11/2016    Years since quitting: 0.5  . Smokeless tobacco: Never Used  . Tobacco comment: using patches. 1 ppd since 1976  Substance and Sexual Activity  . Alcohol use: No    Alcohol/week: 0.0 oz  .  Drug use: No  . Sexual activity: Not Currently

## 2017-04-16 ENCOUNTER — Encounter: Payer: Self-pay | Admitting: Family Medicine

## 2017-04-16 ENCOUNTER — Ambulatory Visit (INDEPENDENT_AMBULATORY_CARE_PROVIDER_SITE_OTHER): Payer: Managed Care, Other (non HMO) | Admitting: Family Medicine

## 2017-04-16 VITALS — BP 112/60 | HR 94 | Temp 98.8°F | Resp 18 | Wt 166.0 lb

## 2017-04-16 DIAGNOSIS — R059 Cough, unspecified: Secondary | ICD-10-CM

## 2017-04-16 DIAGNOSIS — J40 Bronchitis, not specified as acute or chronic: Secondary | ICD-10-CM

## 2017-04-16 DIAGNOSIS — R05 Cough: Secondary | ICD-10-CM

## 2017-04-16 MED ORDER — PREDNISONE 10 MG PO TABS: ORAL_TABLET | ORAL | 0 refills | Status: AC

## 2017-04-16 MED ORDER — HYDROCODONE-HOMATROPINE 5-1.5 MG/5ML PO SYRP: 5.0000 mL | ORAL_SOLUTION | Freq: Three times a day (TID) | ORAL | 0 refills | Status: DC | PRN

## 2017-04-16 MED ORDER — AZITHROMYCIN 250 MG PO TABS: ORAL_TABLET | ORAL | 0 refills | Status: AC

## 2017-04-16 NOTE — Progress Notes (Signed)
Patient: Katelyn Cooper Female    DOB: December 24, 1955   62 y.o.   MRN: [id number] Visit Date: 04/16/2017  Today's Provider: Lelon Huh, MD   Chief Complaint  Patient presents with  . Cough    x 10 days   Subjective:    Cough  This is a new problem. Episode onset: 10 days ago. The problem has been gradually worsening. The cough is non-productive. Associated symptoms include chills, postnasal drip, shortness of breath and wheezing. Pertinent negatives include no chest pain, ear congestion, ear pain, fever, headaches, hemoptysis, myalgias, nasal congestion, rhinorrhea, sore throat or sweats. The symptoms are aggravated by lying down. Treatments tried: Robitussin. The treatment provided no relief.   She has albuterol inhaler prescribed by Dr. Mortimer Fries but has not been using it lately.     Allergies  Allergen Reactions  . Lovastatin     depression  . Paroxetine Hcl     itching  . Lamotrigine Rash     Current Outpatient Medications:  .  albuterol (PROAIR HFA) 108 (90 Base) MCG/ACT inhaler, ProAir HFA 90 mcg/actuation aerosol inhaler  inhale 2 puffs by mouth every 6 hours if needed for wheezing or shortness of breath, Disp: , Rfl:  .  aspirin EC 81 MG tablet, Take 1 tablet (81 mg total) by mouth daily., Disp: , Rfl:  .  atorvastatin (LIPITOR) 40 MG tablet, Take 1 tablet (40 mg total) by mouth daily., Disp: 90 tablet, Rfl: 2 .  Calcium Carb-Cholecalciferol (CALCIUM 1000 + D PO), Take by mouth. 2 daily, Disp: , Rfl:  .  Cholecalciferol (VITAMIN D3) 5000 units TABS, Take 1 tablet by mouth daily. , Disp: , Rfl: 0 .  desoximetasone (TOPICORT) 0.25 % cream, , Disp: , Rfl: 0 .  glucose blood test strip, 1 each by Other route as needed for other (ONE TOUCH ULTRA TEST STRIPS AND LANCETS). Use as instructed, Disp: , Rfl:  .  ibuprofen (ADVIL) 200 MG tablet, Take 400 mg by mouth every 6 (six) hours as needed for headache. , Disp: , Rfl:  .  metoprolol tartrate (LOPRESSOR) 25 MG tablet, TAKE 1  TABLET (25 MG TOTAL) BY MOUTH TWICE DAILY, Disp: 180 tablet, Rfl: 4 .  nitroGLYCERIN (NITROSTAT) 0.4 MG SL tablet, Place 1 tablet (0.4 mg total) under the tongue every 5 (five) minutes as needed for chest pain., Disp: 30 tablet, Rfl: prn .  nystatin cream (MYCOSTATIN), APPLY TOPICALLY IF NEEDED FOR 14 DAYS, Disp: , Rfl: 0 .  pantoprazole (PROTONIX) 40 MG tablet, Take 1 tablet (40 mg total) by mouth 2 (two) times daily before a meal., Disp: 60 tablet, Rfl: 2 .  QUEtiapine (SEROQUEL) 200 MG tablet, Take 1 tablet (200 mg total) by mouth at bedtime., Disp: 90 tablet, Rfl: 1 .  ranitidine (ZANTAC) 150 MG tablet, Take 150 mg by mouth 2 (two) times daily., Disp: , Rfl:  .  venlafaxine XR (EFFEXOR-XR) 75 MG 24 hr capsule, Take 1 capsule (75 mg total) by mouth daily with breakfast., Disp: 90 capsule, Rfl: 1 .  zaleplon (SONATA) 10 MG capsule, Take 1 capsule (10 mg total) by mouth at bedtime as needed for sleep., Disp: 30 capsule, Rfl: 2  Current Facility-Administered Medications:  .  0.9 %  sodium chloride infusion, 500 mL, Intravenous, Continuous, Ladene Artist, MD  Review of Systems  Constitutional: Positive for chills. Negative for fever.  HENT: Positive for postnasal drip. Negative for congestion, ear pain, mouth sores, nosebleeds, rhinorrhea, sinus pressure,  sinus pain, sneezing, sore throat and voice change.   Respiratory: Positive for cough, shortness of breath and wheezing. Negative for hemoptysis and chest tightness.   Cardiovascular: Negative for chest pain.  Musculoskeletal: Negative for myalgias.  Neurological: Negative for headaches.    Social History   Tobacco Use  . Smoking status: Current Every Day Smoker    Packs/day: 1.00    Years: 40.00    Pack years: 40.00    Types: Cigarettes    Start date: 03/01/1975    Last attempt to quit: 10/11/2016    Years since quitting: 0.5  . Smokeless tobacco: Never Used  . Tobacco comment: using patches. 1 ppd since 1976  Substance Use  Topics  . Alcohol use: No    Alcohol/week: 0.0 oz   Objective:   BP 112/60 (BP Location: Left Arm, Patient Position: Sitting, Cuff Size: Normal)   Pulse 94   Temp 98.8 F (37.1 C) (Oral)   Resp 18   Wt 166 lb (75.3 kg)   SpO2 94% Comment: room air  BMI 29.41 kg/m  There were no vitals filed for this visit.   Physical Exam  General Appearance:    Alert, cooperative, no distress  HENT:   bilateral TM normal without fluid or infection, neck without nodes, throat normal without erythema or exudate, sinuses nontender and post nasal drip noted  Eyes:    PERRL, conjunctiva/corneas clear, EOM's intact       Lungs:     Occasional expiratory wheezes, respirations unlabored  Heart:    Regular rate and rhythm  Neurologic:   Awake, alert, oriented x 3. No apparent focal neurological           defect.           Assessment & Plan:     1. Bronchitis Advised to start using albuterol inhaler consistently.  - predniSONE (DELTASONE) 10 MG tablet; 6 tablets for 1 day, then 5 for 1 day, then 4 for 1 day, then 3 for 1 day, then 2 for 1 day then 1 for 1 day.  Dispense: 21 tablet; Refill: 0 - azithromycin (ZITHROMAX) 250 MG tablet; 2 by mouth today, then 1 daily for 4 days  Dispense: 6 tablet; Refill: 0  2. Cough  - HYDROcodone-homatropine (HYCODAN) 5-1.5 MG/5ML syrup; Take 5 mLs by mouth every 8 (eight) hours as needed for cough.  Dispense: 100 mL; Refill: 0 - predniSONE (DELTASONE) 10 MG tablet; 6 tablets for 1 day, then 5 for 1 day, then 4 for 1 day, then 3 for 1 day, then 2 for 1 day then 1 for 1 day.  Dispense: 21 tablet; Refill: 0       Lelon Huh, MD  [facility]

## 2017-04-23 ENCOUNTER — Ambulatory Visit: Payer: Self-pay | Admitting: Family Medicine

## 2017-05-21 ENCOUNTER — Other Ambulatory Visit: Payer: Self-pay | Admitting: *Deleted

## 2017-05-21 DIAGNOSIS — R0789 Other chest pain: Secondary | ICD-10-CM

## 2017-05-21 DIAGNOSIS — R0781 Pleurodynia: Secondary | ICD-10-CM

## 2017-05-21 NOTE — Progress Notes (Signed)
New CT order placed due to other order expired.

## 2017-06-03 ENCOUNTER — Encounter: Payer: Self-pay | Admitting: Psychiatry

## 2017-06-03 ENCOUNTER — Ambulatory Visit (INDEPENDENT_AMBULATORY_CARE_PROVIDER_SITE_OTHER): Payer: 59 | Admitting: Psychiatry

## 2017-06-03 VITALS — BP 116/66 | HR 96 | Ht 63.5 in | Wt 165.0 lb

## 2017-06-03 DIAGNOSIS — F4310 Post-traumatic stress disorder, unspecified: Secondary | ICD-10-CM

## 2017-06-03 DIAGNOSIS — F3160 Bipolar disorder, current episode mixed, unspecified: Secondary | ICD-10-CM | POA: Diagnosis not present

## 2017-06-03 MED ORDER — QUETIAPINE FUMARATE 200 MG PO TABS: 200.0000 mg | ORAL_TABLET | Freq: Every day | ORAL | 1 refills | Status: DC

## 2017-06-03 MED ORDER — ZALEPLON 10 MG PO CAPS: 10.0000 mg | ORAL_CAPSULE | Freq: Every evening | ORAL | 2 refills | Status: DC | PRN

## 2017-06-03 MED ORDER — VENLAFAXINE HCL ER 75 MG PO CP24: 75.0000 mg | ORAL_CAPSULE | Freq: Every day | ORAL | 1 refills | Status: DC
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
## 2017-06-06 ENCOUNTER — Ambulatory Visit
Admission: RE | Admit: 2017-06-06 | Discharge: 2017-06-06 | Disposition: A | Discharge status: Home / Self Care | Payer: Managed Care, Other (non HMO) | Source: Ambulatory Visit | Attending: Internal Medicine | Admitting: Internal Medicine

## 2017-06-06 DIAGNOSIS — R0789 Other chest pain: Secondary | ICD-10-CM | POA: Insufficient documentation

## 2017-06-06 DIAGNOSIS — I2510 Atherosclerotic heart disease of native coronary artery without angina pectoris: Secondary | ICD-10-CM | POA: Diagnosis not present

## 2017-06-06 DIAGNOSIS — R0781 Pleurodynia: Secondary | ICD-10-CM | POA: Diagnosis present

## 2017-06-06 DIAGNOSIS — K8020 Calculus of gallbladder without cholecystitis without obstruction: Secondary | ICD-10-CM | POA: Diagnosis not present

## 2017-06-06 DIAGNOSIS — I700 Atherosclerosis of aorta: Secondary | ICD-10-CM | POA: Insufficient documentation

## 2017-06-06 DIAGNOSIS — J439 Emphysema, unspecified: Secondary | ICD-10-CM | POA: Diagnosis not present

## 2017-06-06 DIAGNOSIS — R932 Abnormal findings on diagnostic imaging of liver and biliary tract: Secondary | ICD-10-CM | POA: Insufficient documentation

## 2017-06-13 ENCOUNTER — Encounter: Payer: Self-pay | Admitting: Internal Medicine

## 2017-06-13 ENCOUNTER — Ambulatory Visit (INDEPENDENT_AMBULATORY_CARE_PROVIDER_SITE_OTHER): Payer: Managed Care, Other (non HMO) | Admitting: Internal Medicine

## 2017-06-13 VITALS — BP 132/72 | HR 113 | Resp 16 | Ht 63.5 in | Wt 165.0 lb

## 2017-06-13 DIAGNOSIS — G4733 Obstructive sleep apnea (adult) (pediatric): Secondary | ICD-10-CM | POA: Diagnosis not present

## 2017-06-13 DIAGNOSIS — D259 Leiomyoma of uterus, unspecified: Secondary | ICD-10-CM | POA: Insufficient documentation

## 2017-06-13 DIAGNOSIS — M8580 Other specified disorders of bone density and structure, unspecified site: Secondary | ICD-10-CM | POA: Insufficient documentation

## 2017-06-13 DIAGNOSIS — N951 Menopausal and female climacteric states: Secondary | ICD-10-CM | POA: Insufficient documentation

## 2017-06-13 NOTE — Progress Notes (Signed)
[facility] Consultation     Date: 06/13/2017,   MRN# [id number] Treva Huyett [birth year]     AdmissionWeight: 165 lb (74.8 kg)                 CurrentWeight: 165 lb (74.8 kg) Ginelle Bays is a 62 y.o. old female seen in consultation for cough, pleuritic chest pain. Previous BQ patient     CHIEF COMPLAINT:   Follow up OSA Still smoking   HISTORY OF PRESENT ILLNESS   Follow up Sleep study  AHI down 5.1 with >90% compliance >4 hrs per night  Patient still smokes 1 PPD Smoking cessation counseling for 7 minutes  For her OSA, Patient has more energy, feels better, fatigue has improved No more frequent naps Patient is doing great on her current CPAP therapy for sleep apnea  Patient still smokes patient has tried nicotine patches Does not want chantix at this time   PFT's 08/2015 reviewed with patient ratio 80% Fev1 2.2 L 87% FVC 2.6L 83%  Ct chest 08/30/15 Left lung Nodule approx 6 MM increased from 2-3 MM from 2013.    CT chest 12/2015 Left Lung nodule approx 6 MM-no change from previous CT scans   CT chest 09/05/16 There is a right lower lobe small opacity probable mucus plugging mucoid impaction  CT chest 3.7.19 The left lower lobe nodule has not increased in size significantly from previous CAT scan   I have reviewed the CT scan findings with patient   Patient     Current Medication:   Current Outpatient Medications:  .  aspirin EC 81 MG tablet, Take 1 tablet (81 mg total) by mouth daily., Disp: , Rfl:  .  atorvastatin (LIPITOR) 40 MG tablet, Take 1 tablet (40 mg total) by mouth daily., Disp: 90 tablet, Rfl: 2 .  Calcium Carb-Cholecalciferol (CALCIUM 1000 + D PO), Take by mouth. 2 daily, Disp: , Rfl:  .  Cholecalciferol (VITAMIN D3) 5000 units TABS, Take 1 tablet by mouth daily. , Disp: , Rfl: 0 .  desoximetasone (TOPICORT) 0.25 % cream, , Disp: , Rfl: 0 .  glucose blood test strip, 1 each by Other route as needed for other (ONE TOUCH  ULTRA TEST STRIPS AND LANCETS). Use as instructed, Disp: , Rfl:  .  metoprolol tartrate (LOPRESSOR) 25 MG tablet, TAKE 1 TABLET (25 MG TOTAL) BY MOUTH TWICE DAILY, Disp: 180 tablet, Rfl: 4 .  nystatin cream (MYCOSTATIN), APPLY TOPICALLY IF NEEDED FOR 14 DAYS, Disp: , Rfl: 0 .  pantoprazole (PROTONIX) 40 MG tablet, Take 1 tablet (40 mg total) by mouth 2 (two) times daily before a meal., Disp: 60 tablet, Rfl: 2 .  QUEtiapine (SEROQUEL) 200 MG tablet, Take 1 tablet (200 mg total) by mouth at bedtime., Disp: 90 tablet, Rfl: 1 .  ranitidine (ZANTAC) 150 MG tablet, Take 150 mg by mouth 2 (two) times daily., Disp: , Rfl:  .  venlafaxine XR (EFFEXOR-XR) 75 MG 24 hr capsule, Take 1 capsule (75 mg total) by mouth daily with breakfast., Disp: 90 capsule, Rfl: 1 .  zaleplon (SONATA) 10 MG capsule, Take 1 capsule (10 mg total) by mouth at bedtime as needed for sleep., Disp: 30 capsule, Rfl: 2     ALLERGIES   Lovastatin; Paroxetine hcl; and Lamotrigine     REVIEW OF SYSTEMS   Review of Systems  Constitutional: Negative for chills, fever, malaise/fatigue and weight loss.  HENT: Negative for congestion.   Respiratory: Negative for cough, hemoptysis, sputum production,  shortness of breath and wheezing.   Cardiovascular: Negative for chest pain, palpitations, orthopnea and leg swelling.  Gastrointestinal: Negative for nausea.  All other systems reviewed and are negative.   Resp 16   Ht 5' 3.5" (1.613 m)   Wt 165 lb (74.8 kg)   BMI 28.77 kg/m     PHYSICAL EXAM   Physical Exam  Constitutional: She is oriented to person, place, and time. No distress.  HENT:  Mouth/Throat: No oropharyngeal exudate.  Cardiovascular: Normal rate, regular rhythm and normal heart sounds.  No murmur heard. Pulmonary/Chest: Effort normal and breath sounds normal. No stridor. No respiratory distress. She has no wheezes. She has no rales.  Musculoskeletal: Normal range of motion. She exhibits no edema.    Neurological: She is alert and oriented to person, place, and time. No cranial nerve deficit.  Skin: Skin is warm. She is not diaphoretic.  Psychiatric: She has a normal mood and affect.        ASSESSMENT/PLAN   62 yo white female with signs and symptoms of intermittent reactive airways disease from vaping, quit tobacoo abuse 10/2015 But has restarted smoking, patient with  left lung solitary pulm nodule that is stable over past 12  months. Patient also has a diagnosis of sleep apnea    Her OSA has imprived with therapy, her reactive airways disease is stable. No acute issues at this time   1.Intermittent reactive airways disease from tobacco abuse -Recommend smoking cessation -Chantix starter pack and continuing Chantix for 1 month prescription provided -Albuterol as needed-  2repeat CT chest reviewed from 3.7.19  -LLL nodule is benign at this time   3.smoking/Vaping  cessation strongly advised  4.continue CPAP therapy for OSA -AHI down to 5.1  Follow up 6 months  Patient are satisfied with Plan of action and management. All questions answered   [name], M.D.  [facility]  Medical Director [place] Director [facility] Cardio-Pulmonary Department

## 2017-06-13 NOTE — Patient Instructions (Addendum)
Please STOP SMOKING!!  Continue CPAP as prescribed

## 2017-06-20 ENCOUNTER — Telehealth: Payer: Self-pay | Admitting: Internal Medicine

## 2017-06-20 MED ORDER — ALBUTEROL SULFATE HFA 108 (90 BASE) MCG/ACT IN AERS: 2.0000 | INHALATION_SPRAY | RESPIRATORY_TRACT | 2 refills | Status: DC | PRN

## 2017-06-20 NOTE — Telephone Encounter (Signed)
Pt is requesting nicotine patches. Would you like for me to send her in some 21mg  patches? Proair has been refilled.

## 2017-06-20 NOTE — Telephone Encounter (Signed)
Patient wants rx for nicotine patches and a pro air inhaler as patient is having difficulty breathing   Patient has set Monday as her quit smoking day .

## 2017-06-21 MED ORDER — NICOTINE 21 MG/24HR TD PT24: 21.0000 mg | MEDICATED_PATCH | TRANSDERMAL | 1 refills | Status: DC

## 2017-06-21 NOTE — Telephone Encounter (Signed)
Nicotine patches sent to the pharmacy. Nothing further needed.

## 2017-06-21 NOTE — Telephone Encounter (Signed)
Yes please

## 2017-08-08 DIAGNOSIS — C4491 Basal cell carcinoma of skin, unspecified: Secondary | ICD-10-CM

## 2017-08-08 HISTORY — DX: Basal cell carcinoma of skin, unspecified: C44.91

## 2017-08-28 ENCOUNTER — Other Ambulatory Visit: Payer: Self-pay | Admitting: Gastroenterology

## 2017-09-02 ENCOUNTER — Ambulatory Visit (INDEPENDENT_AMBULATORY_CARE_PROVIDER_SITE_OTHER): Payer: 59 | Admitting: Psychiatry

## 2017-09-02 ENCOUNTER — Other Ambulatory Visit: Payer: Self-pay

## 2017-09-02 ENCOUNTER — Encounter: Payer: Self-pay | Admitting: Psychiatry

## 2017-09-02 VITALS — BP 102/64 | HR 96 | Temp 98.6°F | Wt 168.2 lb

## 2017-09-02 DIAGNOSIS — F3160 Bipolar disorder, current episode mixed, unspecified: Secondary | ICD-10-CM

## 2017-09-02 DIAGNOSIS — F4310 Post-traumatic stress disorder, unspecified: Secondary | ICD-10-CM

## 2017-09-02 MED ORDER — ZALEPLON 10 MG PO CAPS: 10.0000 mg | ORAL_CAPSULE | Freq: Every evening | ORAL | 2 refills | Status: DC | PRN

## 2017-09-02 MED ORDER — QUETIAPINE FUMARATE 100 MG PO TABS: 100.0000 mg | ORAL_TABLET | Freq: Every day | ORAL | 1 refills | Status: DC
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
## 2017-10-23 ENCOUNTER — Other Ambulatory Visit: Payer: Self-pay | Admitting: Internal Medicine

## 2017-10-28 ENCOUNTER — Telehealth: Payer: Self-pay | Admitting: Internal Medicine

## 2017-10-28 NOTE — Telephone Encounter (Signed)
Pt states that her sleep machine has stopped working. Please call to discuss

## 2017-10-28 NOTE — Telephone Encounter (Signed)
Pt states cpap machine stopped working 10/25/17 in the middle of the night. She says it will power on but starts making a loud noise. Patient and advised to contact DME([facility]) 1st. If no resolution then call our office back. Pt verbalized understanding nothing further needed.

## 2017-11-05 ENCOUNTER — Other Ambulatory Visit: Payer: Self-pay

## 2017-11-05 ENCOUNTER — Emergency Department: Payer: Managed Care, Other (non HMO)

## 2017-11-05 ENCOUNTER — Inpatient Hospital Stay
Admission: EM | Admit: 2017-11-05 | Discharge: 2017-11-08 | DRG: 286 | Disposition: A | Discharge status: Home / Self Care | Payer: Managed Care, Other (non HMO) | Attending: Internal Medicine | Admitting: Internal Medicine

## 2017-11-05 ENCOUNTER — Encounter: Payer: Self-pay | Admitting: Emergency Medicine

## 2017-11-05 DIAGNOSIS — F329 Major depressive disorder, single episode, unspecified: Secondary | ICD-10-CM | POA: Diagnosis present

## 2017-11-05 DIAGNOSIS — E1151 Type 2 diabetes mellitus with diabetic peripheral angiopathy without gangrene: Secondary | ICD-10-CM | POA: Diagnosis present

## 2017-11-05 DIAGNOSIS — N179 Acute kidney failure, unspecified: Secondary | ICD-10-CM | POA: Diagnosis present

## 2017-11-05 DIAGNOSIS — I5033 Acute on chronic diastolic (congestive) heart failure: Secondary | ICD-10-CM

## 2017-11-05 DIAGNOSIS — Z8249 Family history of ischemic heart disease and other diseases of the circulatory system: Secondary | ICD-10-CM

## 2017-11-05 DIAGNOSIS — J309 Allergic rhinitis, unspecified: Secondary | ICD-10-CM | POA: Diagnosis present

## 2017-11-05 DIAGNOSIS — K219 Gastro-esophageal reflux disease without esophagitis: Secondary | ICD-10-CM | POA: Diagnosis present

## 2017-11-05 DIAGNOSIS — I110 Hypertensive heart disease with heart failure: Secondary | ICD-10-CM | POA: Diagnosis present

## 2017-11-05 DIAGNOSIS — I2510 Atherosclerotic heart disease of native coronary artery without angina pectoris: Secondary | ICD-10-CM | POA: Diagnosis present

## 2017-11-05 DIAGNOSIS — J9601 Acute respiratory failure with hypoxia: Secondary | ICD-10-CM | POA: Diagnosis present

## 2017-11-05 DIAGNOSIS — G4733 Obstructive sleep apnea (adult) (pediatric): Secondary | ICD-10-CM | POA: Diagnosis present

## 2017-11-05 DIAGNOSIS — I509 Heart failure, unspecified: Secondary | ICD-10-CM

## 2017-11-05 DIAGNOSIS — Z801 Family history of malignant neoplasm of trachea, bronchus and lung: Secondary | ICD-10-CM | POA: Diagnosis not present

## 2017-11-05 DIAGNOSIS — I340 Nonrheumatic mitral (valve) insufficiency: Secondary | ICD-10-CM | POA: Diagnosis not present

## 2017-11-05 DIAGNOSIS — Z7982 Long term (current) use of aspirin: Secondary | ICD-10-CM | POA: Diagnosis not present

## 2017-11-05 DIAGNOSIS — Z888 Allergy status to other drugs, medicaments and biological substances status: Secondary | ICD-10-CM

## 2017-11-05 DIAGNOSIS — F410 Panic disorder [episodic paroxysmal anxiety] without agoraphobia: Secondary | ICD-10-CM | POA: Diagnosis present

## 2017-11-05 DIAGNOSIS — K449 Diaphragmatic hernia without obstruction or gangrene: Secondary | ICD-10-CM | POA: Diagnosis present

## 2017-11-05 DIAGNOSIS — D649 Anemia, unspecified: Secondary | ICD-10-CM | POA: Diagnosis present

## 2017-11-05 DIAGNOSIS — Z87891 Personal history of nicotine dependence: Secondary | ICD-10-CM

## 2017-11-05 DIAGNOSIS — Z825 Family history of asthma and other chronic lower respiratory diseases: Secondary | ICD-10-CM | POA: Diagnosis not present

## 2017-11-05 DIAGNOSIS — E785 Hyperlipidemia, unspecified: Secondary | ICD-10-CM | POA: Diagnosis present

## 2017-11-05 DIAGNOSIS — Z818 Family history of other mental and behavioral disorders: Secondary | ICD-10-CM

## 2017-11-05 DIAGNOSIS — Z79899 Other long term (current) drug therapy: Secondary | ICD-10-CM

## 2017-11-05 DIAGNOSIS — I5031 Acute diastolic (congestive) heart failure: Secondary | ICD-10-CM | POA: Diagnosis present

## 2017-11-05 DIAGNOSIS — E876 Hypokalemia: Secondary | ICD-10-CM | POA: Diagnosis present

## 2017-11-05 DIAGNOSIS — Z95828 Presence of other vascular implants and grafts: Secondary | ICD-10-CM

## 2017-11-05 LAB — BLOOD GAS, VENOUS
ACID-BASE DEFICIT: 0.3 mmol/L (ref 0.0–2.0)
Bicarbonate: 24 mmol/L (ref 20.0–28.0)
O2 Saturation: 82.6 %
PATIENT TEMPERATURE: 37
PO2 VEN: 46 mmHg — AB (ref 32.0–45.0)
pCO2, Ven: 37 mmHg — ABNORMAL LOW (ref 44.0–60.0)
pH, Ven: 7.42 (ref 7.250–7.430)

## 2017-11-05 LAB — CBC WITH DIFFERENTIAL/PLATELET
BASOS ABS: 0 10*3/uL (ref 0–0.1)
BASOS PCT: 0 %
Eosinophils Absolute: 0.3 10*3/uL (ref 0–0.7)
Eosinophils Relative: 3 %
HCT: 32.3 % — ABNORMAL LOW (ref 35.0–47.0)
Hemoglobin: 10.7 g/dL — ABNORMAL LOW (ref 12.0–16.0)
Lymphocytes Relative: 17 %
Lymphs Abs: 1.7 10*3/uL (ref 1.0–3.6)
MCH: 27.6 pg (ref 26.0–34.0)
MCHC: 33.1 g/dL (ref 32.0–36.0)
MCV: 83.5 fL (ref 80.0–100.0)
Monocytes Absolute: 0.4 10*3/uL (ref 0.2–0.9)
Monocytes Relative: 4 %
NEUTROS PCT: 76 %
Neutro Abs: 7.6 10*3/uL — ABNORMAL HIGH (ref 1.4–6.5)
Platelets: 193 10*3/uL (ref 150–440)
RBC: 3.87 MIL/uL (ref 3.80–5.20)
RDW: 18.1 % — ABNORMAL HIGH (ref 11.5–14.5)
WBC: 10.1 10*3/uL (ref 3.6–11.0)

## 2017-11-05 LAB — COMPREHENSIVE METABOLIC PANEL
ALBUMIN: 3.2 g/dL — AB (ref 3.5–5.0)
ALT: 11 U/L (ref 0–44)
AST: 36 U/L (ref 15–41)
Alkaline Phosphatase: 120 U/L (ref 38–126)
Anion gap: 10 (ref 5–15)
BUN: 6 mg/dL — ABNORMAL LOW (ref 8–23)
CALCIUM: 8.3 mg/dL — AB (ref 8.9–10.3)
CO2: 23 mmol/L (ref 22–32)
CREATININE: 1.04 mg/dL — AB (ref 0.44–1.00)
Chloride: 105 mmol/L (ref 98–111)
GFR calc Af Amer: >60 mL/min (ref >60)
GFR calc non Af Amer: 56 mL/min — ABNORMAL LOW (ref >60)
GLUCOSE: 173 mg/dL — AB (ref 70–99)
Potassium: 3 mmol/L — ABNORMAL LOW (ref 3.5–5.1)
Sodium: 138 mmol/L (ref 135–145)
Total Bilirubin: 0.7 mg/dL (ref 0.3–1.2)
Total Protein: 7.3 g/dL (ref 6.5–8.1)

## 2017-11-05 LAB — TROPONIN I: Troponin I: <0.03 ng/mL (ref <0.03)

## 2017-11-05 LAB — GLUCOSE, CAPILLARY: Glucose-Capillary: 207 mg/dL — ABNORMAL HIGH (ref 70–99)

## 2017-11-05 LAB — BRAIN NATRIURETIC PEPTIDE: B Natriuretic Peptide: 731 pg/mL — ABNORMAL HIGH (ref 0.0–100.0)

## 2017-11-05 MED ORDER — POTASSIUM CHLORIDE CRYS ER 20 MEQ PO TBCR
40.0000 meq | EXTENDED_RELEASE_TABLET | ORAL | Status: AC
  Administered 2017-11-05: 40 meq via ORAL
  Filled 2017-11-05: qty 2

## 2017-11-05 MED ORDER — ATORVASTATIN CALCIUM 20 MG PO TABS
40.0000 mg | ORAL_TABLET | Freq: Every day | ORAL | Status: DC
  Administered 2017-11-06 – 2017-11-07 (×2): 40 mg via ORAL
  Filled 2017-11-05 (×2): qty 2

## 2017-11-05 MED ORDER — INSULIN ASPART 100 UNIT/ML ~~LOC~~ SOLN
0.0000 [IU] | Freq: Three times a day (TID) | SUBCUTANEOUS | Status: DC
  Administered 2017-11-06: 1 [IU] via SUBCUTANEOUS
  Administered 2017-11-06: 2 [IU] via SUBCUTANEOUS
  Administered 2017-11-07 (×2): 1 [IU] via SUBCUTANEOUS
  Filled 2017-11-05 (×4): qty 1

## 2017-11-05 MED ORDER — VENLAFAXINE HCL ER 75 MG PO CP24
75.0000 mg | ORAL_CAPSULE | Freq: Every day | ORAL | Status: DC
  Administered 2017-11-06 – 2017-11-07 (×2): 75 mg via ORAL
  Filled 2017-11-05 (×2): qty 1

## 2017-11-05 MED ORDER — BISACODYL 5 MG PO TBEC
5.0000 mg | DELAYED_RELEASE_TABLET | Freq: Every day | ORAL | Status: DC | PRN
  Administered 2017-11-07: 5 mg via ORAL
  Filled 2017-11-05: qty 1

## 2017-11-05 MED ORDER — ACETAMINOPHEN 650 MG RE SUPP: 650.0000 mg | Freq: Four times a day (QID) | RECTAL | Status: DC | PRN

## 2017-11-05 MED ORDER — PANTOPRAZOLE SODIUM 40 MG PO TBEC
40.0000 mg | DELAYED_RELEASE_TABLET | Freq: Every day | ORAL | Status: DC
  Administered 2017-11-06 – 2017-11-07 (×2): 40 mg via ORAL
  Filled 2017-11-05 (×2): qty 1

## 2017-11-05 MED ORDER — TRAZODONE HCL 50 MG PO TABS
25.0000 mg | ORAL_TABLET | Freq: Every evening | ORAL | Status: DC | PRN
  Administered 2017-11-05 – 2017-11-07 (×2): 25 mg via ORAL
  Filled 2017-11-05 (×2): qty 1

## 2017-11-05 MED ORDER — METOPROLOL TARTRATE 25 MG PO TABS: 25.0000 mg | ORAL_TABLET | Freq: Two times a day (BID) | ORAL | Status: DC

## 2017-11-05 MED ORDER — ONDANSETRON HCL 4 MG/2ML IJ SOLN: 4.0000 mg | Freq: Four times a day (QID) | INTRAMUSCULAR | Status: DC | PRN

## 2017-11-05 MED ORDER — NICOTINE 21 MG/24HR TD PT24
21.0000 mg | MEDICATED_PATCH | TRANSDERMAL | Status: DC
  Filled 2017-11-05: qty 1

## 2017-11-05 MED ORDER — IPRATROPIUM-ALBUTEROL 0.5-2.5 (3) MG/3ML IN SOLN
RESPIRATORY_TRACT | Status: AC
  Administered 2017-11-05: 3 mL via RESPIRATORY_TRACT
  Filled 2017-11-05: qty 3

## 2017-11-05 MED ORDER — METOPROLOL TARTRATE 5 MG/5ML IV SOLN
2.5000 mg | Freq: Once | INTRAVENOUS | Status: AC
Start: 2017-11-05 — End: 2017-11-05
  Administered 2017-11-05: 2.5 mg via INTRAVENOUS
  Filled 2017-11-05: qty 5

## 2017-11-05 MED ORDER — FAMOTIDINE 20 MG PO TABS
10.0000 mg | ORAL_TABLET | Freq: Every day | ORAL | Status: DC
  Administered 2017-11-06 – 2017-11-07 (×2): 10 mg via ORAL
  Filled 2017-11-05 (×2): qty 1

## 2017-11-05 MED ORDER — HEPARIN SODIUM (PORCINE) 5000 UNIT/ML IJ SOLN
5000.0000 [IU] | Freq: Three times a day (TID) | INTRAMUSCULAR | Status: DC
  Administered 2017-11-06 – 2017-11-08 (×7): 5000 [IU] via SUBCUTANEOUS
  Filled 2017-11-05 (×7): qty 1

## 2017-11-05 MED ORDER — METOPROLOL TARTRATE 25 MG PO TABS
25.0000 mg | ORAL_TABLET | Freq: Once | ORAL | Status: AC
  Administered 2017-11-05: 25 mg via ORAL
  Filled 2017-11-05: qty 1

## 2017-11-05 MED ORDER — ALBUTEROL SULFATE (2.5 MG/3ML) 0.083% IN NEBU: 3.0000 mL | INHALATION_SOLUTION | RESPIRATORY_TRACT | Status: DC | PRN

## 2017-11-05 MED ORDER — DOCUSATE SODIUM 100 MG PO CAPS
100.0000 mg | ORAL_CAPSULE | Freq: Two times a day (BID) | ORAL | Status: DC
  Administered 2017-11-06 – 2017-11-07 (×4): 100 mg via ORAL
  Filled 2017-11-05 (×4): qty 1

## 2017-11-05 MED ORDER — FUROSEMIDE 10 MG/ML IJ SOLN
60.0000 mg | Freq: Once | INTRAMUSCULAR | Status: AC
  Administered 2017-11-05: 60 mg via INTRAVENOUS
  Filled 2017-11-05: qty 8

## 2017-11-05 MED ORDER — IPRATROPIUM-ALBUTEROL 0.5-2.5 (3) MG/3ML IN SOLN
3.0000 mL | RESPIRATORY_TRACT | Status: AC
  Administered 2017-11-05: 3 mL via RESPIRATORY_TRACT
  Filled 2017-11-05: qty 3

## 2017-11-05 MED ORDER — HYDROCODONE-ACETAMINOPHEN 5-325 MG PO TABS
1.0000 | ORAL_TABLET | ORAL | Status: DC | PRN
  Administered 2017-11-06 – 2017-11-07 (×4): 2 via ORAL
  Filled 2017-11-05 (×4): qty 2

## 2017-11-05 MED ORDER — ASPIRIN EC 81 MG PO TBEC
81.0000 mg | DELAYED_RELEASE_TABLET | Freq: Every day | ORAL | Status: DC
  Administered 2017-11-06 – 2017-11-07 (×2): 81 mg via ORAL
  Filled 2017-11-05 (×2): qty 1

## 2017-11-05 MED ORDER — QUETIAPINE FUMARATE 25 MG PO TABS
50.0000 mg | ORAL_TABLET | Freq: Every day | ORAL | Status: DC
  Administered 2017-11-05 – 2017-11-07 (×3): 50 mg via ORAL
  Filled 2017-11-05 (×3): qty 2

## 2017-11-05 MED ORDER — INSULIN ASPART 100 UNIT/ML ~~LOC~~ SOLN
0.0000 [IU] | Freq: Every day | SUBCUTANEOUS | Status: DC
  Administered 2017-11-05: 2 [IU] via SUBCUTANEOUS
  Filled 2017-11-05: qty 1

## 2017-11-05 MED ORDER — ONDANSETRON HCL 4 MG PO TABS: 4.0000 mg | ORAL_TABLET | Freq: Four times a day (QID) | ORAL | Status: DC | PRN

## 2017-11-05 MED ORDER — VITAMIN D3 25 MCG (1000 UNIT) PO TABS
5000.0000 [IU] | ORAL_TABLET | Freq: Every day | ORAL | Status: DC
  Administered 2017-11-06 – 2017-11-07 (×2): 5000 [IU] via ORAL
  Filled 2017-11-05 (×3): qty 5

## 2017-11-05 MED ORDER — BUDESONIDE 0.25 MG/2ML IN SUSP
RESPIRATORY_TRACT | Status: AC
  Administered 2017-11-05: 0.25 mg
  Filled 2017-11-05: qty 2

## 2017-11-05 MED ORDER — ZOLPIDEM TARTRATE 5 MG PO TABS
5.0000 mg | ORAL_TABLET | Freq: Every evening | ORAL | Status: DC | PRN
Start: 2017-11-05 — End: 2017-11-08

## 2017-11-05 MED ORDER — ACETAMINOPHEN 325 MG PO TABS: 650.0000 mg | ORAL_TABLET | Freq: Four times a day (QID) | ORAL | Status: DC | PRN

## 2017-11-05 NOTE — ED Provider Notes (Signed)
[facility] Emergency Department Provider Note    First MD Initiated Contact with Patient 11/05/17 1944     (approximate)  I have reviewed the triage vital signs and the nursing notes.   HISTORY  Chief Complaint Respiratory Distress    HPI Katelyn Cooper is a 62 y.o. female acute respiratory distress.  Patient states she is been feeling unwell for the past several days and then while sitting at dinner tonight she developed worsening shortness of breath.  EMS was called and found patient be hypoxic to 70% on room air.  She does wear CPAP at home.  Denies any history of COPD or congestive heart failure.  No new medications.  States that she does get short of breath and laying flat.  Denies any leg swelling.  Denies any chest pain or pressure at this time.  Patient was placed on CPAP given albuterol as well as Solu-Medrol with improvement in symptoms.    Echo 2017: Study Conclusions  - Procedure narrative: Transthoracic echocardiography. Image   quality was fair. The study was technically difficult. - Left ventricle: The cavity size was normal. Wall thickness was   normal. Systolic function was normal. The estimated ejection   fraction was in the range of 55% to 60%. Wall motion was normal   grossly; there were no regional wall motion abnormalities. Left   ventricular diastolic function parameters were normal for the   patient&'s age.   Past Medical History:  Diagnosis Date  . Anxiety   . Depression   . Diabetes type 2, controlled ([place])    diet-controlled  . Esophageal stricture   . GERD (gastroesophageal reflux disease)   . Hiatal hernia   . History of chicken pox   . Hyperlipidemia   . Internal hemorrhoids   . Panic disorder    Family History  Problem Relation Age of Onset  . Lung cancer Mother        was a smoker  . Emphysema Mother   . Cancer Mother        Lung  . Alcohol abuse Mother   . Depression Mother   . Heart disease Mother   .  Drug abuse Sister   . Breast cancer Sister   . Emphysema Sister   . Bipolar disorder Sister   . Alcohol abuse Other   . Depression Other   . Bipolar disorder Other   . Heart disease Other   . Vision loss Other   . Alcohol abuse Father   . Mood Disorder Father   . Heart disease Maternal Grandmother   . Stroke Maternal Grandmother   . Esophageal cancer Maternal Aunt   . Colon cancer Neg Hx    Past Surgical History:  Procedure Laterality Date  . [place]   stent placement; ? left iliac artery intervention  . APPENDECTOMY  1998  . CARDIAC CATHETERIZATION N/A 01/12/2016   Procedure: Left Heart Cath and Coronary Angiography;  Surgeon: Nelva Bush, MD;  Location: [place] [facility];  Service: Cardiovascular;  Laterality: N/A;  . FOOT SURGERY     Right  . pap smear  03/2010   Done by [name], normal  . REFRACTIVE SURGERY     right  . RIB RESECTION  [id number]  . SHOULDER SURGERY  2005   left  . UPPER GI ENDOSCOPY  09/19/2011   Stricture at GE junction, dilated. Mild gastritis and duodenitis. Small hiatal hernia   Patient Active Problem List  Diagnosis Date Noted  . Acute exacerbation of CHF (congestive heart failure) ([place]) 11/05/2017  . Menopausal symptom 06/13/2017  . Osteopenia 06/13/2017  . Uterine leiomyoma 06/13/2017  . Peripheral vascular disease ([place]) 09/19/2016  . Hyperlipidemia LDL goal <70 09/19/2016  . Coronary atherosclerosis 09/05/2016  . Abnormal stress test 01/12/2016  . Pleuritic chest pain 07/28/2015  . Duodenitis 01/05/2015  . Dermatitis, nummular 01/05/2015  . Allergic rhinitis 12/23/2014  . Anxiety 12/23/2014  . Ataxia 12/23/2014  . Back ache 12/23/2014  . Cervical muscle strain 12/23/2014  . Depression 12/23/2014  . Eczema 12/23/2014  . GERD (gastroesophageal reflux disease) 12/23/2014  . History of anemia 12/23/2014  . Pure hypercholesterolemia 12/23/2014  . Episodic mood disorder ([place]) 12/23/2014  . Nephropyelitis  12/23/2014  . Situational stress 12/23/2014  . Tremor 12/23/2014  . Vitamin D deficiency 11/03/2013  . Diabetes mellitus type 2, controlled ([place]) 08/13/2011  . Cough 05/10/2011  . Chest pain 05/10/2011  . Tobacco abuse 05/10/2011      Prior to Admission medications   Medication Sig Start Date End Date Taking? Authorizing Provider  albuterol (PROAIR HFA) 108 (90 Base) MCG/ACT inhaler Inhale 2 puffs into the lungs every 4 (four) hours as needed for wheezing or shortness of breath. 06/20/17  Yes Flora Lipps, MD  aspirin EC 81 MG tablet Take 1 tablet (81 mg total) by mouth daily. 12/12/15  Yes End, Harrell Gave, MD  atorvastatin (LIPITOR) 40 MG tablet TAKE 1 TABLET BY MOUTH DAILY 10/23/17  Yes End, Harrell Gave, MD  Calcium Carb-Cholecalciferol (CALCIUM 1000 + D PO) Take by mouth. 2 daily   Yes [provider]  Cholecalciferol (VITAMIN D3) 5000 units TABS Take 1 tablet by mouth daily.  04/09/16  Yes [provider]  desoximetasone (TOPICORT) 0.25 % cream  04/22/16  Yes [provider]  glucose blood test strip 1 each by Other route as needed for other (ONE TOUCH ULTRA TEST STRIPS AND LANCETS). Use as instructed   Yes [provider]  metoprolol tartrate (LOPRESSOR) 25 MG tablet TAKE 1 TABLET (25 MG TOTAL) BY MOUTH TWICE DAILY 10/15/16  Yes [name], MD  nicotine (NICODERM CQ - DOSED IN MG/24 HOURS) 21 mg/24hr patch Place 1 patch (21 mg total) onto the skin daily. 06/21/17 06/21/18 Yes Kasa, Maretta Bees, MD  pantoprazole (PROTONIX) 40 MG tablet TAKE 1 TABLET BY MOUTH TWICE A DAY BEFORE A MEAL 08/29/17  Yes Ladene Artist, MD  QUEtiapine (SEROQUEL) 100 MG tablet Take 1 tablet (100 mg total) by mouth at bedtime. Pt has supply 09/02/17  Yes Rainey Pines, MD  ranitidine (ZANTAC) 150 MG tablet Take 150 mg by mouth 2 (two) times daily.   Yes [provider]  venlafaxine XR (EFFEXOR-XR) 75 MG 24 hr capsule Take 1 capsule (75 mg total) by mouth daily with breakfast.  06/03/17  Yes Rainey Pines, MD  zaleplon (SONATA) 10 MG capsule Take 1 capsule (10 mg total) by mouth at bedtime as needed for sleep. 09/02/17  Yes Rainey Pines, MD    Allergies Lovastatin; Paroxetine hcl; and Lamotrigine    Social History Social History   Tobacco Use  . Smoking status: Current Every Day Smoker    Packs/day: 1.00    Years: 40.00    Pack years: 40.00    Types: Cigarettes    Start date: 03/01/1975    Last attempt to quit: 10/11/2016    Years since quitting: 1.0  . Smokeless tobacco: Never Used  Substance Use Topics  . Alcohol  use: No    Alcohol/week: 0.0 oz  . Drug use: No    Review of Systems Patient denies headaches, rhinorrhea, blurry vision, numbness, shortness of breath, chest pain, edema, cough, abdominal pain, nausea, vomiting, diarrhea, dysuria, fevers, rashes or hallucinations unless otherwise stated above in HPI. ____________________________________________   PHYSICAL EXAM:  VITAL SIGNS: Vitals:   11/05/17 2224 11/05/17 2247  BP: (!) 146/84 (!) 144/78  Pulse: (!) 102 (!) 111  Resp: 20   Temp:  98.5 F (36.9 C)  SpO2: 94% 95%    Constitutional: Alert and oriented in mild to moderate resp distress.  Eyes: Conjunctivae are normal.  Head: Atraumatic. Nose: No congestion/rhinnorhea. Mouth/Throat: Mucous membranes are moist.   Neck: No stridor. Painless ROM.  Cardiovascular: Normal rate, regular rhythm. Grossly normal heart sounds.  Good peripheral circulation. Respiratory: mild tachypnea with diffuse anterior wheeze and posterior crackles. Gastrointestinal: Soft and nontender. No distention. No abdominal bruits. No CVA tenderness. Genitourinary: deferred Musculoskeletal: No lower extremity tenderness nor edema.  No joint effusions. Neurologic:  Normal speech and language. No gross focal neurologic deficits are appreciated. No facial droop Skin:  Skin is warm, dry and intact. No rash noted. Psychiatric: Mood and affect are normal. Speech and  behavior are normal.  ____________________________________________   LABS (all labs ordered are listed, but only abnormal results are displayed)  Results for orders placed or performed during the hospital encounter of 11/05/17 (from the past 24 hour(s))  CBC with Differential/Platelet     Status: Abnormal   Collection Time: 11/05/17  7:55 PM  Result Value Ref Range   WBC 10.1 3.6 - 11.0 K/uL   RBC 3.87 3.80 - 5.20 MIL/uL   Hemoglobin 10.7 (L) 12.0 - 16.0 g/dL   HCT 32.3 (L) 35.0 - 47.0 %   MCV 83.5 80.0 - 100.0 fL   MCH 27.6 26.0 - 34.0 pg   MCHC 33.1 32.0 - 36.0 g/dL   RDW 18.1 (H) 11.5 - 14.5 %   Platelets 193 150 - 440 K/uL   Neutrophils Relative % 76 %   Neutro Abs 7.6 (H) 1.4 - 6.5 K/uL   Lymphocytes Relative 17 %   Lymphs Abs 1.7 1.0 - 3.6 K/uL   Monocytes Relative 4 %   Monocytes Absolute 0.4 0.2 - 0.9 K/uL   Eosinophils Relative 3 %   Eosinophils Absolute 0.3 0 - 0.7 K/uL   Basophils Relative 0 %   Basophils Absolute 0.0 0 - 0.1 K/uL  Comprehensive metabolic panel     Status: Abnormal   Collection Time: 11/05/17  7:55 PM  Result Value Ref Range   Sodium 138 135 - 145 mmol/L   Potassium 3.0 (L) 3.5 - 5.1 mmol/L   Chloride 105 98 - 111 mmol/L   CO2 23 22 - 32 mmol/L   Glucose, Bld 173 (H) 70 - 99 mg/dL   BUN 6 (L) 8 - 23 mg/dL   Creatinine, Ser 1.04 (H) 0.44 - 1.00 mg/dL   Calcium 8.3 (L) 8.9 - 10.3 mg/dL   Total Protein 7.3 6.5 - 8.1 g/dL   Albumin 3.2 (L) 3.5 - 5.0 g/dL   AST 36 15 - 41 U/L   ALT 11 0 - 44 U/L   Alkaline Phosphatase 120 38 - 126 U/L   Total Bilirubin 0.7 0.3 - 1.2 mg/dL   GFR calc non Af Amer 56 (L) >60 mL/min   GFR calc Af Amer >60 >60 mL/min   Anion gap 10 5 - 15  Troponin  I     Status: None   Collection Time: 11/05/17  7:55 PM  Result Value Ref Range   Troponin I <0.03 <0.03 ng/mL  Brain natriuretic peptide     Status: Abnormal   Collection Time: 11/05/17  7:55 PM  Result Value Ref Range   B Natriuretic Peptide 731.0 (H) 0.0 - 100.0  pg/mL  Blood gas, venous     Status: Abnormal   Collection Time: 11/05/17  8:50 PM  Result Value Ref Range   pH, Ven 7.42 7.250 - 7.430   pCO2, Ven 37 (L) 44.0 - 60.0 mmHg   pO2, Ven 46.0 (H) 32.0 - 45.0 mmHg   Bicarbonate 24.0 20.0 - 28.0 mmol/L   Acid-base deficit 0.3 0.0 - 2.0 mmol/L   O2 Saturation 82.6 %   Patient temperature 37.0    Collection site VENOUS    Sample type VENOUS   Glucose, capillary     Status: Abnormal   Collection Time: 11/05/17 11:15 PM  Result Value Ref Range   Glucose-Capillary 207 (H) 70 - 99 mg/dL   ____________________________________________  EKG My review and personal interpretation at Time: 19:48   Indication: resp distress  Rate: 115  Rhythm: sinus Axis: normal Other: normal intervals, nonspecific st abn ____________________________________________  RADIOLOGY  I personally reviewed all radiographic images ordered to evaluate for the above acute complaints and reviewed radiology reports and findings.  These findings were personally discussed with the patient.  Please see medical record for radiology report.  ____________________________________________   PROCEDURES  Procedure(s) performed:  .Critical Care Performed by: Merlyn Lot, MD Authorized by: Merlyn Lot, MD   Critical care provider statement:    Critical care time (minutes):  35   Critical care time was exclusive of:  Separately billable procedures and treating other patients   Critical care was necessary to treat or prevent imminent or life-threatening deterioration of the following conditions:  Respiratory failure   Critical care was time spent personally by me on the following activities:  Development of treatment plan with patient or surrogate, discussions with consultants, evaluation of patient's response to treatment, examination of patient, obtaining history from patient or surrogate, ordering and performing treatments and interventions, ordering and review of  laboratory studies, ordering and review of radiographic studies, pulse oximetry, re-evaluation of patient's condition and review of old charts      Critical Care performed: yes ____________________________________________   INITIAL IMPRESSION / [place] / ED COURSE  Pertinent labs & imaging results that were available during my care of the patient were reviewed by me and considered in my medical decision making (see chart for details).   DDX: Asthma, copd, CHF, pna, ptx, malignancy, Pe, anemia   Katelyn Cooper is a 62 y.o. who presents to the ED with respiratory distress as described above.  Patient seems to have improved on CPAP.  She is protecting her airway.  Breath sounds as described above.  Still requiring submental oxygen for acute respiratory failure with hypoxia.  Letter will be sent for the above differential.  Certainly seems concerning for COPD exacerbation based on wheeze though she has no history of this.  Possible cardiac wheeze.  Denies any chest pain.  Unlikely ACS.  Clinical Course as of Nov 06 4  Tue Nov 05, 2017  2122 Reassessed.  Trip negative but BNP is elevated.  No hypercapnia.  This seems to be primarily acute hypoxic respiratory failure likely secondary to volume overload.  Do not see any evidence of pneumonia.  Will start on Lasix.  Will give additional metoprolol and Lopressor for rate control.   [PR]    Clinical Course User Index [PR] Merlyn Lot, MD   Given patient's respiratory failure with hypoxia and evidence of probable new onset pulmonary edema and congestive heart failure patient will require hospitalization for additional diuresis and medication management.   As part of my medical decision making, I reviewed the following data within the [facility] notes reviewed and incorporated, Labs reviewed, notes from prior ED visits.   ____________________________________________   FINAL CLINICAL IMPRESSION(S) /  ED DIAGNOSES  Final diagnoses:  Acute respiratory failure with hypoxia (HCC)  Acute congestive heart failure, unspecified heart failure type ([place])      NEW MEDICATIONS STARTED DURING THIS VISIT:  Current Discharge Medication List       Note:  This document was prepared using Dragon voice recognition software and may include unintentional dictation errors.    Merlyn Lot, MD 11/06/17 [phone]

## 2017-11-05 NOTE — Progress Notes (Signed)
Electrolyte replacement consult  PM K+ 3.0. 40 mEq KCl PO ordered by MD. Recheck K+ and Mg with AM labs.  Sim Boast, PharmD, BCPS  11/05/17 11:53 PM

## 2017-11-05 NOTE — ED Notes (Signed)
Patient states she feels much better. States her O2 sats usually are no higher than 95% on regular basis at home.

## 2017-11-05 NOTE — Plan of Care (Signed)
Patient was admitted at 2300. Patient profile completed. No complaints of pain. Patient is diuresing.Will continue to monitor.

## 2017-11-05 NOTE — ED Notes (Signed)
Artist Pais , RN aware if bed assigned

## 2017-11-05 NOTE — ED Triage Notes (Signed)
Patient to ER via [facility] for c/o difficulty breathing. Patient c/o cough for last couple of days (non-productive). Developed resp distress at dinner, 70% on RA. Lower lobes diminished. Has CPAP at home. No itching, no N/V, no hives present. Received 10mg  Albuterol, 1mg  Atrovent, 125mg  Solumedrol. Patient states she feels much better upon arrival since receiving meds (arrived on cpap).

## 2017-11-05 NOTE — ED Notes (Signed)
Spoke to MD [facility] about potassium level. MD to put in orders for potassium supplement.

## 2017-11-05 NOTE — H&P (Signed)
[facility] NAME: Katelyn Cooper    MR#:  [id number]  DATE OF BIRTH:  17-Nov-1955  DATE OF ADMISSION:  11/05/2017  PRIMARY CARE PHYSICIAN: [name], MD   REQUESTING/REFERRING PHYSICIAN:   CHIEF COMPLAINT:   Chief Complaint  Patient presents with  . Respiratory Distress    HISTORY OF PRESENT ILLNESS: Katelyn Cooper  is a 62 y.o. female with a known history of diabetes type 2, hyperlipidemia, anxiety and depression disorder. Patient was brought to emergency room via EMS for acute onset of shortness of breath going on for the past 24 hours, gradually getting worse.  Her shortness of breath would be worsened by exertion.  Patient states she will be gasping for air after walking just 2 steps.  Fatigue and generalized weakness are associated, but no chest pain, palpitations, no fever or chills, no bleeding.  No swelling in the lower extremities.   EMS found her hypoxic with oxygen saturation in 70s.  She is currently on 3-1/2 L per nasal cannula with oxygen saturation at 96%. Patient denies having similar episodes in the past.  She uses CPAP every evening at home.  She used to smoke but quit in March of this year. Most recent 2D echo, from 2 years ago, showed normal ejection fraction. Blood test done emergency room are notable for potassium level at 3, creatinine level of 1.04 and BNP at 731.  First troponin level is less than 0.03. EKG, reviewed by myself, shows sinus tachycardia with heart rate at 115, normal axis, normal intervals, no acute ischemic changes. Chest x-ray shows bilateral pulmonary edema. Patient is admitted for further evaluation and treatment.  PAST MEDICAL HISTORY:   Past Medical History:  Diagnosis Date  . Anxiety   . Depression   . Diabetes type 2, controlled ([place])    diet-controlled  . Esophageal stricture   . GERD (gastroesophageal reflux disease)   . Hiatal hernia   . History of chicken pox   .  Hyperlipidemia   . Internal hemorrhoids   . Panic disorder     PAST SURGICAL HISTORY:  Past Surgical History:  Procedure Laterality Date  . [place]   stent placement; ? left iliac artery intervention  . APPENDECTOMY  1998  . CARDIAC CATHETERIZATION N/A 01/12/2016   Procedure: Left Heart Cath and Coronary Angiography;  Surgeon: Nelva Bush, MD;  Location: [facility];  Service: Cardiovascular;  Laterality: N/A;  . FOOT SURGERY     Right  . pap smear  03/2010   Done by [name], normal  . REFRACTIVE SURGERY     right  . RIB RESECTION  [id number]  . SHOULDER SURGERY  2005   left  . UPPER GI ENDOSCOPY  09/19/2011   Stricture at GE junction, dilated. Mild gastritis and duodenitis. Small hiatal hernia    SOCIAL HISTORY:  Social History   Tobacco Use  . Smoking status: Current Every Day Smoker    Packs/day: 1.00    Years: 40.00    Pack years: 40.00    Types: Cigarettes    Start date: 03/01/1975    Last attempt to quit: 10/11/2016    Years since quitting: 1.0  . Smokeless tobacco: Never Used  Substance Use Topics  . Alcohol use: No    Alcohol/week: 0.0 oz    FAMILY HISTORY:  Family History  Problem Relation Age of Onset  . Lung cancer Mother  was a smoker  . Emphysema Mother   . Cancer Mother        Lung  . Alcohol abuse Mother   . Depression Mother   . Heart disease Mother   . Drug abuse Sister   . Breast cancer Sister   . Emphysema Sister   . Bipolar disorder Sister   . Alcohol abuse Other   . Depression Other   . Bipolar disorder Other   . Heart disease Other   . Vision loss Other   . Alcohol abuse Father   . Mood Disorder Father   . Heart disease Maternal Grandmother   . Stroke Maternal Grandmother   . Esophageal cancer Maternal Aunt   . Colon cancer Neg Hx     DRUG ALLERGIES:  Allergies  Allergen Reactions  . Lovastatin     depression  . Paroxetine Hcl     itching  . Lamotrigine Rash    REVIEW OF  SYSTEMS:   CONSTITUTIONAL: No fever, but patient complains fatigue and general weakness.  EYES: No blurred or double vision.  EARS, NOSE, AND THROAT: No tinnitus or ear pain.  RESPIRATORY: Positive for shortness of breath with exertion.  No cough, wheezing or hemoptysis.  CARDIOVASCULAR: No chest pain, orthopnea, edema.  GASTROINTESTINAL: No nausea, vomiting, diarrhea or abdominal pain.  GENITOURINARY: No dysuria, hematuria.  ENDOCRINE: No polyuria, nocturia,  HEMATOLOGY: No anemia, easy bruising or bleeding SKIN: No rash or lesion. MUSCULOSKELETAL: No joint pain or arthritis.   NEUROLOGIC: No tingling, numbness, weakness.  PSYCHIATRY: Positive history of anxiety and depression.   MEDICATIONS AT HOME:  Prior to Admission medications   Medication Sig Start Date End Date Taking? Authorizing Provider  albuterol (PROAIR HFA) 108 (90 Base) MCG/ACT inhaler Inhale 2 puffs into the lungs every 4 (four) hours as needed for wheezing or shortness of breath. 06/20/17   Flora Lipps, MD  aspirin EC 81 MG tablet Take 1 tablet (81 mg total) by mouth daily. 12/12/15   End, Harrell Gave, MD  atorvastatin (LIPITOR) 40 MG tablet TAKE 1 TABLET BY MOUTH DAILY 10/23/17   End, Harrell Gave, MD  Calcium Carb-Cholecalciferol (CALCIUM 1000 + D PO) Take by mouth. 2 daily    [provider]  Cholecalciferol (VITAMIN D3) 5000 units TABS Take 1 tablet by mouth daily.  04/09/16   [provider]  desoximetasone (TOPICORT) 0.25 % cream  04/22/16   [provider]  glucose blood test strip 1 each by Other route as needed for other (ONE TOUCH ULTRA TEST STRIPS AND LANCETS). Use as instructed    [provider]  metoprolol tartrate (LOPRESSOR) 25 MG tablet TAKE 1 TABLET (25 MG TOTAL) BY MOUTH TWICE DAILY 10/15/16   [name], MD  nicotine (NICODERM CQ - DOSED IN MG/24 HOURS) 21 mg/24hr patch Place 1 patch (21 mg total) onto the skin daily. 06/21/17 06/21/18  Flora Lipps, MD  nystatin cream  (MYCOSTATIN) APPLY TOPICALLY IF NEEDED FOR 14 DAYS 07/18/15   [provider]  pantoprazole (PROTONIX) 40 MG tablet TAKE 1 TABLET BY MOUTH TWICE A DAY BEFORE A MEAL 08/29/17   Ladene Artist, MD  QUEtiapine (SEROQUEL) 100 MG tablet Take 1 tablet (100 mg total) by mouth at bedtime. Pt has supply 09/02/17   Rainey Pines, MD  ranitidine (ZANTAC) 150 MG tablet Take 150 mg by mouth 2 (two) times daily.    [provider]  venlafaxine XR (EFFEXOR-XR) 75 MG 24 hr capsule Take 1 capsule (75 mg total)  by mouth daily with breakfast. 06/03/17   Rainey Pines, MD  zaleplon (SONATA) 10 MG capsule Take 1 capsule (10 mg total) by mouth at bedtime as needed for sleep. 09/02/17   Rainey Pines, MD      PHYSICAL EXAMINATION:   VITAL SIGNS: Blood pressure (!) 141/70, pulse (!) 106, temperature 99.2 F (37.3 C), temperature source Oral, resp. rate 14, height 5\' 3"  (1.6 m), weight 74.8 kg (165 lb), SpO2 96 %.  GENERAL:  62 y.o.-year-old patient lying in the bed with mild respiratory distress, at rest, sitting up at 90 degrees.  EYES: Pupils equal, round, reactive to light and accommodation. No scleral icterus. Extraocular muscles intact.  HEENT: Head atraumatic, normocephalic. Oropharynx and nasopharynx clear.  NECK:  Supple, no jugular venous distention. No thyroid enlargement, no tenderness.  LUNGS: Tachypnea is noted.  Reduced breath sounds and scattered wheezing are noted bilaterally.  Bibasilar crackles are heard posteriorly.  Patient is using accessory muscles of respiration.  CARDIOVASCULAR: Tachycardic.  S1, S2 normal. No S3/S4.  ABDOMEN: Soft, nontender, nondistended. Bowel sounds present. No organomegaly or mass.  EXTREMITIES: No pedal edema, cyanosis, or clubbing.  NEUROLOGIC: Cranial nerves II through XII are intact. Muscle strength 5/5 in all extremities. Sensation intact.  PSYCHIATRIC: The patient is alert and oriented x 3.  SKIN: No obvious rash, lesion, or ulcer.   LABORATORY PANEL:    CBC Recent Labs  Lab 11/05/17 1955  WBC 10.1  HGB 10.7*  HCT 32.3*  PLT 193  MCV 83.5  MCH 27.6  MCHC 33.1  RDW 18.1*  LYMPHSABS 1.7  MONOABS 0.4  EOSABS 0.3  BASOSABS 0.0   ------------------------------------------------------------------------------------------------------------------  Chemistries  Recent Labs  Lab 11/05/17 1955  NA 138  K 3.0*  CL 105  CO2 23  GLUCOSE 173*  BUN 6*  CREATININE 1.04*  CALCIUM 8.3*  AST 36  ALT 11  ALKPHOS 120  BILITOT 0.7   ------------------------------------------------------------------------------------------------------------------ estimated creatinine clearance is 53.7 mL/min (A) (by C-G formula based on SCr of 1.04 mg/dL (H)). ------------------------------------------------------------------------------------------------------------------ No results for input(s): TSH, T4TOTAL, T3FREE, THYROIDAB in the last 72 hours.  Invalid input(s): FREET3   Coagulation profile No results for input(s): INR, PROTIME in the last 168 hours. ------------------------------------------------------------------------------------------------------------------- No results for input(s): DDIMER in the last 72 hours. -------------------------------------------------------------------------------------------------------------------  Cardiac Enzymes Recent Labs  Lab 11/05/17 1955  TROPONINI <0.03   ------------------------------------------------------------------------------------------------------------------ Invalid input(s): POCBNP  ---------------------------------------------------------------------------------------------------------------  Urinalysis    Component Value Date/Time   BILIRUBINUR Neg 06/23/2015 0839   PROTEINUR Neg 06/23/2015 0839   UROBILINOGEN 0.2 06/23/2015 0839   NITRITE Neg 06/23/2015 0839   LEUKOCYTESUR Negative 06/23/2015 0839     RADIOLOGY: Dg Chest Portable 1 View  Result Date:  11/05/2017 CLINICAL DATA:  Nonproductive cough, respiratory distress. EXAM: PORTABLE CHEST 1 VIEW COMPARISON:  CT scan of June 06, 2017. Radiographs of April 30, 2016. FINDINGS: Stable cardiomediastinal silhouette. Interval development of bilateral perihilar and basilar interstitial densities are noted consistent with pulmonary edema. No pneumothorax or significant pleural effusion is noted. Bony thorax is unremarkable. IMPRESSION: Interval development of bilateral pulmonary edema. Electronically Signed   By: Marijo Conception, M.D.   On: 11/05/2017 20:23    EKG: Orders placed or performed during the hospital encounter of 11/05/17  . EKG 12-Lead  . EKG 12-Lead  . ED EKG  . ED EKG    IMPRESSION AND PLAN:  1.  Acute respiratory failure with hypoxia, likely related to acute CHF exacerbation and possibly COPD exacerbation.  We will start Lasix IV, duo nebs and oxygen therapy.  Will check 2D echo and consult cardiology for further evaluation and treatment.  Continue to monitor on telemetry and follow troponin levels. 2.  Pulmonary edema, will start treatment of Lasix IV.  Will check 2D echo for further evaluation of the heart function.  Cardiology is consulted for further evaluation and treatment 3.  Diabetes type 2, diet controlled.  Will monitor blood sugars before meals and at bedtime and use insulin treatment during the hospital stay. 4.   Acute renal failure.  Slightly elevated at 1.04.  This could be related to fluid overload.  We will continue to monitor kidney function closely and avoid nephrotoxic medications. 5.  Anxiety/depression disorder, stable, continue home medications.  All the records are reviewed and case discussed with ED provider. Management plans discussed with the patient, who is in agreement.  CODE STATUS: Full Code Status History    Date Active Date Inactive Code Status Order ID Comments User Context   01/12/2016 0931 01/12/2016 1521 Full Code [id number]  Nelva Bush,  MD Inpatient       TOTAL TIME TAKING CARE OF THIS PATIENT: 45 minutes.    Amelia Jo M.D on 11/05/2017 at 9:48 PM  Between 7am to 6pm - Pager - [phone]  After 6pm go to [email or web address] - password [email or web address] [facility]  Office  [phone]  CC: Primary care physician; [name], MD

## 2017-11-06 ENCOUNTER — Inpatient Hospital Stay (HOSPITAL_COMMUNITY)
Admit: 2017-11-06 | Discharge: 2017-11-06 | Disposition: A | Discharge status: Home / Self Care | Payer: Managed Care, Other (non HMO) | Attending: Internal Medicine | Admitting: Internal Medicine

## 2017-11-06 ENCOUNTER — Inpatient Hospital Stay: Payer: Managed Care, Other (non HMO)

## 2017-11-06 DIAGNOSIS — I5033 Acute on chronic diastolic (congestive) heart failure: Secondary | ICD-10-CM

## 2017-11-06 DIAGNOSIS — I5031 Acute diastolic (congestive) heart failure: Secondary | ICD-10-CM

## 2017-11-06 DIAGNOSIS — I340 Nonrheumatic mitral (valve) insufficiency: Secondary | ICD-10-CM

## 2017-11-06 LAB — BASIC METABOLIC PANEL
Anion gap: 10 (ref 5–15)
BUN: 7 mg/dL — AB (ref 8–23)
CHLORIDE: 103 mmol/L (ref 98–111)
CO2: 25 mmol/L (ref 22–32)
CREATININE: 1.03 mg/dL — AB (ref 0.44–1.00)
Calcium: 8.3 mg/dL — ABNORMAL LOW (ref 8.9–10.3)
GFR calc non Af Amer: 57 mL/min — ABNORMAL LOW (ref >60)
Glucose, Bld: 213 mg/dL — ABNORMAL HIGH (ref 70–99)
Potassium: 3.4 mmol/L — ABNORMAL LOW (ref 3.5–5.1)
SODIUM: 138 mmol/L (ref 135–145)

## 2017-11-06 LAB — CBC
HCT: 31.1 % — ABNORMAL LOW (ref 35.0–47.0)
Hemoglobin: 10.4 g/dL — ABNORMAL LOW (ref 12.0–16.0)
MCH: 27.9 pg (ref 26.0–34.0)
MCHC: 33.4 g/dL (ref 32.0–36.0)
MCV: 83.6 fL (ref 80.0–100.0)
PLATELETS: 181 10*3/uL (ref 150–440)
RBC: 3.72 MIL/uL — AB (ref 3.80–5.20)
RDW: 18.1 % — ABNORMAL HIGH (ref 11.5–14.5)
WBC: 8.7 10*3/uL (ref 3.6–11.0)

## 2017-11-06 LAB — GLUCOSE, CAPILLARY
GLUCOSE-CAPILLARY: 179 mg/dL — AB (ref 70–99)
GLUCOSE-CAPILLARY: 131 mg/dL — AB (ref 70–99)
Glucose-Capillary: 167 mg/dL — ABNORMAL HIGH (ref 70–99)
Glucose-Capillary: 190 mg/dL — ABNORMAL HIGH (ref 70–99)

## 2017-11-06 LAB — ECHOCARDIOGRAM COMPLETE
Height: 63 in
WEIGHTICAEL: 2520 [oz_av]

## 2017-11-06 LAB — FIBRIN DERIVATIVES D-DIMER (ARMC ONLY): Fibrin derivatives D-dimer (ARMC): 2549.47 ng/mL (FEU) — ABNORMAL HIGH (ref 0.00–499.00)

## 2017-11-06 LAB — MAGNESIUM: MAGNESIUM: 2 mg/dL (ref 1.7–2.4)

## 2017-11-06 MED ORDER — FUROSEMIDE 10 MG/ML IJ SOLN: 20.0000 mg | Freq: Two times a day (BID) | INTRAMUSCULAR | Status: DC

## 2017-11-06 MED ORDER — FUROSEMIDE 40 MG PO TABS: 40.0000 mg | ORAL_TABLET | Freq: Two times a day (BID) | ORAL | Status: DC

## 2017-11-06 MED ORDER — PERFLUTREN LIPID MICROSPHERE
1.0000 mL | INTRAVENOUS | Status: AC | PRN
  Administered 2017-11-06: 2 mL via INTRAVENOUS
  Filled 2017-11-06: qty 10

## 2017-11-06 MED ORDER — METOPROLOL TARTRATE 25 MG PO TABS
12.5000 mg | ORAL_TABLET | Freq: Two times a day (BID) | ORAL | Status: DC
  Administered 2017-11-06 – 2017-11-07 (×4): 12.5 mg via ORAL
  Filled 2017-11-06 (×4): qty 1

## 2017-11-06 MED ORDER — SODIUM CHLORIDE 0.9% FLUSH
3.0000 mL | Freq: Two times a day (BID) | INTRAVENOUS | Status: DC
  Administered 2017-11-06 – 2017-11-07 (×2): 3 mL via INTRAVENOUS

## 2017-11-06 MED ORDER — POTASSIUM CHLORIDE CRYS ER 20 MEQ PO TBCR
20.0000 meq | EXTENDED_RELEASE_TABLET | Freq: Once | ORAL | Status: AC
  Administered 2017-11-06: 20 meq via ORAL
  Filled 2017-11-06: qty 1

## 2017-11-06 MED ORDER — IPRATROPIUM-ALBUTEROL 0.5-2.5 (3) MG/3ML IN SOLN
RESPIRATORY_TRACT | Status: AC
  Filled 2017-11-06: qty 3

## 2017-11-06 MED ORDER — IPRATROPIUM-ALBUTEROL 0.5-2.5 (3) MG/3ML IN SOLN
3.0000 mL | Freq: Four times a day (QID) | RESPIRATORY_TRACT | Status: DC
  Administered 2017-11-06 – 2017-11-07 (×5): 3 mL via RESPIRATORY_TRACT
  Filled 2017-11-06 (×5): qty 3

## 2017-11-06 MED ORDER — FUROSEMIDE 10 MG/ML IJ SOLN
20.0000 mg | Freq: Two times a day (BID) | INTRAMUSCULAR | Status: DC
  Administered 2017-11-06 (×2): 20 mg via INTRAVENOUS
  Filled 2017-11-06 (×2): qty 2

## 2017-11-06 MED ORDER — FUROSEMIDE 10 MG/ML IJ SOLN: 40.0000 mg | Freq: Two times a day (BID) | INTRAMUSCULAR | Status: DC

## 2017-11-06 MED ORDER — POTASSIUM CHLORIDE CRYS ER 20 MEQ PO TBCR: 20.0000 meq | EXTENDED_RELEASE_TABLET | Freq: Every day | ORAL | Status: DC

## 2017-11-06 MED ORDER — IOPAMIDOL (ISOVUE-370) INJECTION 76%
75.0000 mL | Freq: Once | INTRAVENOUS | Status: AC | PRN
  Administered 2017-11-06: 75 mL via INTRAVENOUS

## 2017-11-06 MED ORDER — POTASSIUM CHLORIDE CRYS ER 20 MEQ PO TBCR
40.0000 meq | EXTENDED_RELEASE_TABLET | Freq: Once | ORAL | Status: AC
  Administered 2017-11-06: 40 meq via ORAL
  Filled 2017-11-06: qty 2

## 2017-11-06 NOTE — Progress Notes (Signed)
*  PRELIMINARY RESULTS* Echocardiogram 2D Echocardiogram has been performed.  Katelyn Cooper 11/06/2017, 10:55 AM

## 2017-11-06 NOTE — Progress Notes (Signed)
Chaplain responded to an OR for and AD. Pt's spouse was at the bedside. Chaplain educated Pt and spouse about AD. Pt said spouse was he 59. Chaplain offered prayer. Chaplain let Pt know Chaplain services is always available.    11/06/17 1000  Clinical Encounter Type  Visited With Patient and family together  Visit Type Initial;Spiritual support  Referral From Nurse

## 2017-11-06 NOTE — Consult Note (Signed)
Cardiology Consult    Patient ID: Katelyn Cooper MRN: [id number], DOB/AGE: December 25, 1955   Admit date: 11/05/2017 Date of Consult: 11/06/2017  Primary Physician: [name], MD Primary Cardiologist: No primary care provider on file. Requesting Provider: Dr. Duane Boston  Patient Profile    Katelyn Cooper is a 62 y.o. female known to Dr. Saunders Revel with a history of nonobstructive CAD s/p LHC (2017) and 2017 TTE with normal EF, HLD, DM2, PVD with prior L iliac stent in the 1990s, thoracic outlet syndrome s/p b/l rib resections in 1990, OSA (on CPAP), GERD complicated by esophageal stricture, anxiety / depression who is being seen today for the evaluation of SOB ddx of acute CHF exacerbation at the request of Dr. Duane Boston.  Past Medical History   Past Medical History:  Diagnosis Date  . Anxiety   . Depression   . Diabetes type 2, controlled ([place])    diet-controlled  . Esophageal stricture   . GERD (gastroesophageal reflux disease)   . Hiatal hernia   . History of chicken pox   . Hyperlipidemia   . Internal hemorrhoids   . Panic disorder     Past Surgical History:  Procedure Laterality Date  . [place]   stent placement; ? left iliac artery intervention  . APPENDECTOMY  1998  . CARDIAC CATHETERIZATION N/A 01/12/2016   Procedure: Left Heart Cath and Coronary Angiography;  Surgeon: Nelva Bush, MD;  Location: [facility];  Service: Cardiovascular;  Laterality: N/A;  . FOOT SURGERY     Right  . pap smear  03/2010   Done by [name], normal  . REFRACTIVE SURGERY     right  . RIB RESECTION  [id number]  . SHOULDER SURGERY  2005   left  . UPPER GI ENDOSCOPY  09/19/2011   Stricture at GE junction, dilated. Mild gastritis and duodenitis. Small hiatal hernia     Allergies  Allergies  Allergen Reactions  . Lovastatin     depression  . Paroxetine Hcl     itching  . Lamotrigine Rash    History of Present Illness    Patient is known to [facility]. Seen 02/08/2016 by Dr. [facility] for chest tightness (worse in summer), dyspnea (worse when laying flat), fatigue despite medical tx with metoprolol and isosorbide mononitrate. Per Dr. Darnelle Bos 02/2016 note, some improvement with ibuprofen, recommended for costochondritis. 2017 Normal LVEF with TTE and LHC results as below. Documented isosorbide mononitrate intolerance d/t significant HA.  On 11/05/2017, she presented to [facility] ED with c/o worsening SOB x1 day. She reports that yesterday 11/04/17 afternoon, she felt worsening SOB that came on suddenly and was made whenever she tried to walk a few steps but did improve with rest, such as leaning against the kitchen counter. Her SOB reportedly prevented her from eating her meal, because she could not get enough air to swallow her food. She states she hoped to wait to see her PCP today about her SOB; however, she became much worse after deciding to lay flat in bed for some rest after putting on her CPAP machine. She reports "hearing herself breathe" and gasping for air at that time, and so her husband called for EMS. When they arrived at the scene, EMS reportedly found her hypoxic with O2 sats in the 70s, and she was brought to the ED. She reports she has not experienced an episode like this before. She denies any associated chest pain, pressure, racing heart  rate, or palpitations. She does report feeling dizzy and fatigued /weak when walking around but denies any syncope. She denies fever, chills, headache, n/v/d, or increase in LEE or abdominal swelling. Excluding this most recent episode of SOB, she denies previous difficulty lying flat at night and has not used additional pillows for sleeping. She does report worsening SOB leading up to the episode and when walking short distances, as well as climbing stairs. However, as noted above, this is the worst episode of SOB and unlike those in the past.  Of note, she reports recent travel to [facility],  [place] via car with her granddaughter, during which time she ate out much more often than usual. She further explains that her daughter spent two weeks with her in [place] as well, and during that time, she cooked more unhealthy food until her granddaughter left last week. She denies any recent inpatient surgeries but does report outpatient procedures, including cataract surgery.  She also reports she quit smoking in April 2019 and does not drink alcohol or do any illegal drugs.   In the ED, she was found to be in acute respiratory failure with hypoxia and received 3.5L [place] O2 with sats improving to 96%. She also received lasix 40mg  po, with urine output of ~2.1L / 4 lbs weight decrease and patient report of improvement in breathing. Vitals significant for elevated BP 141/70, tachycardia with HR 106. Labs significant for Hgb 10.7, K+ 3.0, glucose 173, BUN 6, Cr 1.04, Ca 8.3. Troponin negative x1. CXR read as interval development of b/l pulmonary edema with stable cardiomediastinal silhouette.   Echo ordered by ED MD and pending results.  Ddimer pending results & ordered given recent travel and tachycardia.  As noted above, risk factors for cardiovascular disease include DM2, HLD, PVD.   Inpatient Medications    . aspirin EC  81 mg Oral Daily  . atorvastatin  40 mg Oral Daily  . cholecalciferol  5,000 Units Oral Daily  . docusate sodium  100 mg Oral BID  . famotidine  10 mg Oral Daily  . furosemide  20 mg Intravenous BID  . heparin  5,000 Units Subcutaneous Q8H  . insulin aspart  0-5 Units Subcutaneous QHS  . insulin aspart  0-9 Units Subcutaneous TID WC  . ipratropium-albuterol  3 mL Nebulization Q6H  . ipratropium-albuterol      . metoprolol tartrate  12.5 mg Oral BID  . nicotine  21 mg Transdermal Q24H  . pantoprazole  40 mg Oral Daily  . potassium chloride  40 mEq Oral Once   Followed by  . potassium chloride  20 mEq Oral Once  . [START ON 11/07/2017] potassium chloride  20 mEq Oral Daily  .  QUEtiapine  50 mg Oral QHS  . venlafaxine XR  75 mg Oral Q breakfast    Family History    Family History  Problem Relation Age of Onset  . Lung cancer Mother        was a smoker  . Emphysema Mother   . Cancer Mother        Lung  . Alcohol abuse Mother   . Depression Mother   . Heart disease Mother   . Drug abuse Sister   . Breast cancer Sister   . Emphysema Sister   . Bipolar disorder Sister   . Alcohol abuse Other   . Depression Other   . Bipolar disorder Other   . Heart disease Other   . Vision loss Other   .  Alcohol abuse Father   . Mood Disorder Father   . Heart disease Maternal Grandmother   . Stroke Maternal Grandmother   . Esophageal cancer Maternal Aunt   . Colon cancer Neg Hx    indicated that her mother is deceased. She indicated that her father is deceased. She indicated that only one of her two sisters is alive. She indicated that her brother is deceased. She indicated that the status of her maternal grandmother is unknown. She indicated that the status of her maternal aunt is unknown. She indicated that the status of her neg hx is unknown. She indicated that her other is alive.   Social History    Social History   Socioeconomic History  . Marital status: Married    Spouse name: mark  . Number of children: 0  . Years of education: Not on file  . Highest education level: High school graduate  Occupational History  . Occupation: computer-retired    Employer: [employer]  Social Needs  . Financial resource strain: Not hard at all  . Food insecurity:    Worry: Never true    Inability: Never true  . Transportation needs:    Medical: No    Non-medical: No  Tobacco Use  . Smoking status: Current Every Day Smoker    Packs/day: 1.00    Years: 40.00    Pack years: 40.00    Types: Cigarettes    Start date: 03/01/1975    Last attempt to quit: 10/11/2016    Years since quitting: 1.0  . Smokeless tobacco: Never Used  Substance and Sexual Activity  .  Alcohol use: No    Alcohol/week: 0.0 oz  . Drug use: No  . Sexual activity: Not Currently  Lifestyle  . Physical activity:    Days per week: 0 days    Minutes per session: 0 min  . Stress: Not at all  Relationships  . Social connections:    Talks on phone: Once a week    Gets together: Never    Attends religious service: Never    Active member of club or organization: No    Attends meetings of clubs or organizations: Never    Relationship status: Married  . Intimate partner violence:    Fear of current or ex partner: No    Emotionally abused: No    Physically abused: No    Forced sexual activity: No  Other Topics Concern  . Not on file  Social History Narrative  . Not on file     Review of Systems    General:  No chills, fever, night sweats or weight changes, excluding 4lbs fluid change with lasix.  Cardiovascular:  No chest pain, +++dyspnea on exertion, no edema, +++orthopnea, no palpitations, no paroxysmal nocturnal dyspnea. Dermatological: No rash, lesions/masses Respiratory: No cough, +++dyspnea Urologic: No hematuria, dysuria Abdominal:   No nausea, vomiting, diarrhea, bright red blood per rectum, melena, or hematemesis Neurologic:  No visual changes, +++wkns, no changes in mental status. All other systems reviewed and are otherwise negative except as noted above.  Physical Exam    Blood pressure 108/62, pulse 89, temperature 97.9 F (36.6 C), temperature source Oral, resp. rate 18, height 5\' 3"  (1.6 m), weight 157 lb 8 oz (71.4 kg), SpO2 95 %.  General: Pleasant, NAD Psych: Normal affect. Neuro: Alert and oriented X 3. Moves all extremities spontaneously. HEENT: Normal  Neck: Supple without bruits or JVD. Lungs:  Resp regular and unlabored, CTA. Heart: tachycardic with regular  rhythm, no s3, s4, or murmurs. Abdomen: Soft, non-tender, non-distended, BS + x 4.  Extremities: No clubbing, cyanosis or edema with compression stockings. DP/PT/Radials 2+ and equal  bilaterally.  Labs    Troponin (Point of Care Test) No results for input(s): TROPIPOC in the last 72 hours. Recent Labs    11/05/17 1955  TROPONINI <0.03   Lab Results  Component Value Date   WBC 8.7 11/06/2017   HGB 10.4 (L) 11/06/2017   HCT 31.1 (L) 11/06/2017   MCV 83.6 11/06/2017   PLT 181 11/06/2017    Recent Labs  Lab 11/05/17 1955 11/06/17 0515  NA 138 138  K 3.0* 3.4*  CL 105 103  CO2 23 25  BUN 6* 7*  CREATININE 1.04* 1.03*  CALCIUM 8.3* 8.3*  PROT 7.3  --   BILITOT 0.7  --   ALKPHOS 120  --   ALT 11  --   AST 36  --   GLUCOSE 173* 213*   Lab Results  Component Value Date   CHOL 148 09/21/2016   HDL 40 09/21/2016   LDLCALC 73 09/21/2016   TRIG 175 (H) 09/21/2016   No results found for: [facility]   Radiology Studies    Dg Chest Portable 1 View  Result Date: 11/05/2017 CLINICAL DATA:  Nonproductive cough, respiratory distress. EXAM: PORTABLE CHEST 1 VIEW COMPARISON:  CT scan of June 06, 2017. Radiographs of April 30, 2016. FINDINGS: Stable cardiomediastinal silhouette. Interval development of bilateral perihilar and basilar interstitial densities are noted consistent with pulmonary edema. No pneumothorax or significant pleural effusion is noted. Bony thorax is unremarkable. IMPRESSION: Interval development of bilateral pulmonary edema. Electronically Signed   By: Marijo Conception, M.D.   On: 11/05/2017 20:23    ECG & Cardiac Imaging    11/06/17 TTE Pending  01/12/2016 LHC and Coronary Angio Conclusions: 1. Mild, non-obstructive coronary artery disease.  There is slightly sluggish flow throughout the coronary arteries, which may reflect microvascular dysfunction. 2. Upper normal to mildly elevated left ventricular filling pressure. Recommendations 1. Continue risk factor modification. 2. Uptitrate metoprolol and continue long-acting nitrate for possible component of microvascular dysfunction. 3. Outpatient follow-up with  myself.  12/28/2015 TTE EF 55-60% - Procedure narrative: Transthoracic echocardiography. Image   quality was fair. The study was technically difficult. - Left ventricle: The cavity size was normal. Wall thickness was   normal. Systolic function was normal. The estimated ejection   fraction was in the range of 55% to 60%. Wall motion was normal   grossly; there were no regional wall motion abnormalities. Left   ventricular diastolic function parameters were normal for the   patient&'s age.  Assessment & Plan    1. Acute SOB exacerbation in setting of mild nonobstructive CAD  - No current CP or cardiac symptoms, including palpitations or feeling of heart racing.  - Recently quit smoking, April 2019. - 02/08/2016 - Dr. Saunders Revel [facility] f/u as above. - Isosorbide mononitrate intolerance documented ([facility]) 02/2016. - Troponin negative x1. Continue to cycle.  - Pending D-dimer, considering tachycardia and SOB. - Continue furosemide 40mg  IV. Improved SOB and urine output with lasix.  - Continue K-dur as recommended by pharmacy electrolyte consult today for hypokalemia. - Continue metoprolol tartrate, 12.5mg  BID. - Continue atorvastatin 40mg  po daily. Continue asa 81mg  po daily. - Consider ACE/ARB pending trending of renal function.  - Continue to monitor BP and HR/ telemetry / tachycardia. - Further cardiac workup pending 11/06/17 echo ordered by ED.  2. Hypokalemia - Per 8/7 electrolyte replacement consult, replace K with goal 4.0  - Mg 2.0  3. AKI - As per above, continue to monitor renal function and consider ACE/ARB prior to discharge pending labs - Consider renal function in setting of nephrotoxic medications / contrast procedures - Cr 1.04  1.03 / BUN 6   7 - Baseline Cr 0.9   4. Anemia - Hgb 10.4. Monitor - Per IM  5. Hypoxia with acute respiratory failure in setting of pulmonary edema - CXR as above. Continue duonebs, oxygen therapy, IV Lasix - Per IM  6. DM2 - Diet  controlled at home - Per IM  7. Remainder per IM   For questions or updates, please contact [facility] Please consult [email or web address] for contact info under Cardiology/STEMI.      Dorthula Nettles, PA-C  Pager [phone] 11/06/2017, 8:19 AM

## 2017-11-06 NOTE — Progress Notes (Signed)
[facility] NAME: Katelyn Cooper    MR#:  [id number]  DATE OF BIRTH:  [birth year]  SUBJECTIVE:  Patient still with some SOB  REVIEW OF SYSTEMS:    Review of Systems  Constitutional: Negative for fever, chills weight loss HENT: Negative for ear pain, nosebleeds, congestion, facial swelling, rhinorrhea, neck pain, neck stiffness and ear discharge.   Respiratory: Negative for cough, ++ shortness of breath, NO wheezing  Cardiovascular: Negative for chest pain, palpitations and leg swelling.  Gastrointestinal: Negative for heartburn, abdominal pain, vomiting, diarrhea or consitpation Genitourinary: Negative for dysuria, urgency, frequency, hematuria Musculoskeletal: Negative for back pain or joint pain Neurological: Negative for dizziness, seizures, syncope, focal weakness,  numbness and headaches.  Hematological: Does not bruise/bleed easily.  Psychiatric/Behavioral: Negative for hallucinations, confusion, dysphoric mood    Tolerating Diet: yes      DRUG ALLERGIES:   Allergies  Allergen Reactions  . Lovastatin     depression  . Paroxetine Hcl     itching  . Lamotrigine Rash    VITALS:  Blood pressure 108/62, pulse 89, temperature 97.9 F (36.6 C), temperature source Oral, resp. rate 18, height 5\' 3"  (1.6 m), weight 157 lb 8 oz (71.4 kg), SpO2 95 %.  PHYSICAL EXAMINATION:  Constitutional: Appears well-developed and well-nourished. No distress. HENT: Normocephalic. [name] Oropharynx is clear and moist.  Eyes: Conjunctivae and EOM are normal. PERRLA, no scleral icterus.  Neck: Normal ROM. Neck supple. No JVD. No tracheal deviation. CVS: RRR, S1/S2 +, no murmurs, no gallops, no carotid bruit.  Pulmonary: Normal respiratory effort with bilateral crackles Abdominal: Soft. BS +,  no distension, tenderness, rebound or guarding.  Musculoskeletal: Normal range of motion. No edema and no tenderness.  Neuro: Alert. CN 2-12 grossly  intact. No focal deficits. Skin: Skin is warm and dry. No rash noted. Psychiatric: Normal mood and affect.      LABORATORY PANEL:   CBC Recent Labs  Lab 11/06/17 0515  WBC 8.7  HGB 10.4*  HCT 31.1*  PLT 181   ------------------------------------------------------------------------------------------------------------------  Chemistries  Recent Labs  Lab 11/05/17 1955 11/06/17 0515  NA 138 138  K 3.0* 3.4*  CL 105 103  CO2 23 25  GLUCOSE 173* 213*  BUN 6* 7*  CREATININE 1.04* 1.03*  CALCIUM 8.3* 8.3*  MG  --  2.0  AST 36  --   ALT 11  --   ALKPHOS 120  --   BILITOT 0.7  --    ------------------------------------------------------------------------------------------------------------------  Cardiac Enzymes Recent Labs  Lab 11/05/17 1955  TROPONINI <0.03   ------------------------------------------------------------------------------------------------------------------  RADIOLOGY:  Dg Chest Portable 1 View  Result Date: 11/05/2017 CLINICAL DATA:  Nonproductive cough, respiratory distress. EXAM: PORTABLE CHEST 1 VIEW COMPARISON:  CT scan of June 06, 2017. Radiographs of April 30, 2016. FINDINGS: Stable cardiomediastinal silhouette. Interval development of bilateral perihilar and basilar interstitial densities are noted consistent with pulmonary edema. No pneumothorax or significant pleural effusion is noted. Bony thorax is unremarkable. IMPRESSION: Interval development of bilateral pulmonary edema. Electronically Signed   By: Marijo Conception, M.D.   On: 11/05/2017 20:23     ASSESSMENT AND PLAN:   62 year old female with a history of remote tobacco dependence and mild nonobstructive CAD who presents with shortness of breath  1. Fluid overload causing shortness of breath with elevated BNP on admission: Echocardiogram pending Continue IV Lasix/potassium supplementation Monitor intake and output with daily weight Continue metoprolol with decreased dose due to  low blood pressure Appreciate cardiology consultation Follow-up on CT chest  2.  Hypokalemia: This is been repleted  3.  Essential hypertension: Continue metoprolol  4.  Nonobstructive CAD: Continue aspirin, statin and metoprolol  5.  Depression: Continue Effexor and Seroquel  6.  Diabetes: Continue sliding scale with ADA diet  7. Tobacco dependence: Patient is encouraged to quit smoking. Counseling was provided for 4 minutes. Patient quit smoking in March.  Patient is encouraged to continue to remain abstinent.  Continue nicotine patch.  Management plans discussed with the patient and husband and they are in agreement.  CODE STATUS: Full  TOTAL TIME TAKING CARE OF THIS PATIENT: 30 minutes.   Discussed with cardiology  POSSIBLE D/C tomorrow, DEPENDING ON CLINICAL CONDITION.   Katelyn Cooper M.D on 11/06/2017 at 10:55 AM  Between 7am to 6pm - Pager - [phone] After 6pm go to [email or web address] - password [email or web address] [facility]  Office  [phone]  CC: Primary care physician; Katelyn Sons, MD  Note: This dictation was prepared with Dragon dictation along with smaller phrase technology. Any transcriptional errors that result from this process are unintentional.

## 2017-11-06 NOTE — Progress Notes (Signed)
Electrolyte replacement consult  11/06/2017 K 3.4, Ca 8.3, Mg 2, phos not assessed. Will give Klor-Con 40 mEq po once this morning and Klor-Con 20 mEq po once this evening and recheck all electrolytes tomorrow with AM labs.   Biannca Scantlin A. Jordan Hawks, PharmD, BCPS Clinical Pharmacist  11/06/17 7:09 AM

## 2017-11-06 NOTE — Progress Notes (Signed)
Pt stated she did not need a tx at this time, she had clear breath sounds and in no respiratory distress at this time.

## 2017-11-07 ENCOUNTER — Encounter: Payer: Self-pay | Admitting: *Deleted

## 2017-11-07 DIAGNOSIS — I2510 Atherosclerotic heart disease of native coronary artery without angina pectoris: Secondary | ICD-10-CM

## 2017-11-07 LAB — HIV ANTIBODY (ROUTINE TESTING W REFLEX): HIV SCREEN 4TH GENERATION: NONREACTIVE

## 2017-11-07 LAB — GLUCOSE, CAPILLARY
GLUCOSE-CAPILLARY: 106 mg/dL — AB (ref 70–99)
GLUCOSE-CAPILLARY: 125 mg/dL — AB (ref 70–99)
Glucose-Capillary: 149 mg/dL — ABNORMAL HIGH (ref 70–99)
Glucose-Capillary: 147 mg/dL — ABNORMAL HIGH (ref 70–99)

## 2017-11-07 LAB — BASIC METABOLIC PANEL
Anion gap: 10 (ref 5–15)
BUN: 13 mg/dL (ref 8–23)
CHLORIDE: 104 mmol/L (ref 98–111)
CO2: 26 mmol/L (ref 22–32)
Calcium: 8.6 mg/dL — ABNORMAL LOW (ref 8.9–10.3)
Creatinine, Ser: 1.01 mg/dL — ABNORMAL HIGH (ref 0.44–1.00)
GFR calc non Af Amer: 58 mL/min — ABNORMAL LOW (ref >60)
Glucose, Bld: 122 mg/dL — ABNORMAL HIGH (ref 70–99)
POTASSIUM: 3.8 mmol/L (ref 3.5–5.1)
Sodium: 140 mmol/L (ref 135–145)

## 2017-11-07 LAB — MAGNESIUM: MAGNESIUM: 2.3 mg/dL (ref 1.7–2.4)

## 2017-11-07 LAB — TROPONIN I: Troponin I: 0.03 ng/mL (ref <0.03)

## 2017-11-07 LAB — PHOSPHORUS: PHOSPHORUS: 3.2 mg/dL (ref 2.5–4.6)

## 2017-11-07 MED ORDER — SODIUM CHLORIDE 0.9 % IV SOLN: 250.0000 mL | INTRAVENOUS | Status: DC | PRN

## 2017-11-07 MED ORDER — SODIUM CHLORIDE 0.9% FLUSH: 3.0000 mL | INTRAVENOUS | Status: DC | PRN

## 2017-11-07 MED ORDER — ADULT MULTIVITAMIN W/MINERALS CH
1.0000 | ORAL_TABLET | Freq: Every day | ORAL | Status: DC
  Administered 2017-11-07: 1 via ORAL
  Filled 2017-11-07: qty 1

## 2017-11-07 MED ORDER — SODIUM CHLORIDE 0.9 % IV SOLN
INTRAVENOUS | Status: DC
  Administered 2017-11-08: 07:00:00 via INTRAVENOUS

## 2017-11-07 MED ORDER — ASPIRIN 81 MG PO CHEW
81.0000 mg | CHEWABLE_TABLET | ORAL | Status: AC
  Administered 2017-11-08: 81 mg via ORAL
  Filled 2017-11-07: qty 1

## 2017-11-07 MED ORDER — SODIUM CHLORIDE 0.9% FLUSH
3.0000 mL | Freq: Two times a day (BID) | INTRAVENOUS | Status: DC
  Administered 2017-11-07 (×2): 3 mL via INTRAVENOUS

## 2017-11-07 MED ORDER — IPRATROPIUM-ALBUTEROL 0.5-2.5 (3) MG/3ML IN SOLN
3.0000 mL | Freq: Two times a day (BID) | RESPIRATORY_TRACT | Status: DC
  Administered 2017-11-07 – 2017-11-08 (×2): 3 mL via RESPIRATORY_TRACT
  Filled 2017-11-07 (×3): qty 3

## 2017-11-07 MED ORDER — ENSURE ENLIVE PO LIQD
237.0000 mL | Freq: Three times a day (TID) | ORAL | Status: DC
  Administered 2017-11-07 (×2): 237 mL via ORAL

## 2017-11-07 MED ORDER — POTASSIUM CHLORIDE CRYS ER 20 MEQ PO TBCR
20.0000 meq | EXTENDED_RELEASE_TABLET | Freq: Two times a day (BID) | ORAL | Status: AC
  Administered 2017-11-07 (×2): 20 meq via ORAL
  Filled 2017-11-07 (×2): qty 1

## 2017-11-07 MED ORDER — FUROSEMIDE 10 MG/ML IJ SOLN
40.0000 mg | Freq: Two times a day (BID) | INTRAMUSCULAR | Status: DC
  Administered 2017-11-07 (×2): 40 mg via INTRAVENOUS
  Filled 2017-11-07 (×2): qty 4

## 2017-11-07 NOTE — Plan of Care (Signed)
Nutrition Education Note  RD consulted for nutrition education regarding new onset CHF.  RD provided "Low Sodium Nutrition Therapy" handout from the Academy of Nutrition and Dietetics. Reviewed patient's dietary recall. Provided examples on ways to decrease sodium intake in diet. Discouraged intake of processed foods and use of salt shaker. Encouraged fresh fruits and vegetables as well as whole grain sources of carbohydrates to maximize fiber intake.   RD discussed why it is important for patient to adhere to diet recommendations, and emphasized the role of fluids, foods to avoid, and importance of weighing self daily. Teach back method used.  Expect good compliance.  Body mass index is 27.81 kg/m. Pt meets criteria for normal weight based on current BMI.  Current diet order is HH, patient is consuming approximately 25% of meals at this time.   RD following this pt   Koleen Distance MS, RD, LDN Pager #[phone] Office#- [phone] After Hours Pager: [phone]

## 2017-11-07 NOTE — Progress Notes (Signed)
Electrolyte replacement consult  11/07/2017 K 3.8, Ca 8.6, Mg 2.3, phos 3.2. Will give Klor-Con 20 mEq po BID and recheck all electrolytes tomorrow with AM labs.   Katelyn Cooper A. Jordan Hawks, PharmD, BCPS Clinical Pharmacist  11/07/17 7:50 AM

## 2017-11-07 NOTE — Progress Notes (Signed)
[facility] NAME: Katelyn Cooper    MR#:  [id number]  DATE OF BIRTH:  03-Mar-1956  SUBJECTIVE:  Patient still with SOB  Not lay flat.  Denies chest pain REVIEW OF SYSTEMS:    Review of Systems  Constitutional: Negative for fever, chills weight loss HENT: Negative for ear pain, nosebleeds, congestion, facial swelling, rhinorrhea, neck pain, neck stiffness and ear discharge.   Respiratory: Negative for cough, ++ shortness of breath, NO wheezing  Cardiovascular: Negative for chest pain, palpitations and leg swelling.  Gastrointestinal: Negative for heartburn, abdominal pain, vomiting, diarrhea or consitpation Genitourinary: Negative for dysuria, urgency, frequency, hematuria Musculoskeletal: Negative for back pain or joint pain Neurological: Negative for dizziness, seizures, syncope, focal weakness,  numbness and headaches.  Hematological: Does not bruise/bleed easily.  Psychiatric/Behavioral: Negative for hallucinations, confusion, dysphoric mood    Tolerating Diet: yes      DRUG ALLERGIES:   Allergies  Allergen Reactions  . Lovastatin     depression  . Paroxetine Hcl     itching  . Lamotrigine Rash    VITALS:  Blood pressure 107/77, pulse 81, temperature 97.6 F (36.4 C), temperature source Oral, resp. rate 18, height 5\' 3"  (1.6 m), weight 71.2 kg, SpO2 95 %.  PHYSICAL EXAMINATION:  Constitutional: Appears well-developed and well-nourished. No distress. HENT: Normocephalic. [name] Oropharynx is clear and moist.  Eyes: Conjunctivae and EOM are normal. PERRLA, no scleral icterus.  Neck: Normal ROM. Neck supple. No JVD. No tracheal deviation. CVS: RRR, S1/S2 +, no murmurs, no gallops, no carotid bruit.  Pulmonary: Normal respiratory effort with bilateral crackles Abdominal: Soft. BS +,  no distension, tenderness, rebound or guarding.  Musculoskeletal: Normal range of motion. No edema and no tenderness.  Neuro: Alert. CN  2-12 grossly intact. No focal deficits. Skin: Skin is warm and dry. No rash noted. Psychiatric: Normal mood and affect.      LABORATORY PANEL:   CBC Recent Labs  Lab 11/06/17 0515  WBC 8.7  HGB 10.4*  HCT 31.1*  PLT 181   ------------------------------------------------------------------------------------------------------------------  Chemistries  Recent Labs  Lab 11/05/17 1955  11/07/17 0535  NA 138   < > 140  K 3.0*   < > 3.8  CL 105   < > 104  CO2 23   < > 26  GLUCOSE 173*   < > 122*  BUN 6*   < > 13  CREATININE 1.04*   < > 1.01*  CALCIUM 8.3*   < > 8.6*  MG  --    < > 2.3  AST 36  --   --   ALT 11  --   --   ALKPHOS 120  --   --   BILITOT 0.7  --   --    < > = values in this interval not displayed.   ------------------------------------------------------------------------------------------------------------------  Cardiac Enzymes Recent Labs  Lab 11/05/17 1955  TROPONINI <0.03   ------------------------------------------------------------------------------------------------------------------  RADIOLOGY:  Ct Angio Chest Pe W Or Wo Contrast  Result Date: 11/06/2017 CLINICAL DATA:  Shortness of breath and congestive heart failure. EXAM: CT ANGIOGRAPHY CHEST WITH CONTRAST TECHNIQUE: Multidetector CT imaging of the chest was performed using the standard protocol during bolus administration of intravenous contrast. Multiplanar CT image reconstructions and MIPs were obtained to evaluate the vascular anatomy. CONTRAST:  78mL ISOVUE-370 IOPAMIDOL (ISOVUE-370) INJECTION 76% COMPARISON:  Radiography 11/05/2017.  CT 06/06/2017. FINDINGS: Cardiovascular: Pulmonary arterial opacification is good. There are no pulmonary emboli.  Heart size is normal. No pericardial fluid. There is aortic atherosclerosis but no aneurysm or dissection. There is coronary artery calcification. Mediastinum/Nodes: Mild mediastinal nodal prominence, probably reactive 2 congestive heart failure.  Lungs/Pleura: Background emphysema. Interstitial pulmonary edema. Bilateral effusions with dependent pulmonary atelectasis. 4 mm subpleural nodule in the left lower lobe is unchanged and benign. Upper Abdomen: Early cirrhosis as suggested previously. Musculoskeletal: Normal Review of the MIP images confirms the above findings. IMPRESSION: Findings consistent with congestive heart failure. Interstitial edema. Bilateral effusions layering dependently with dependent atelectasis. Background emphysema. Aortic atherosclerosis. Coronary artery calcification. No pulmonary emboli. No change in a subpleural benign 4 mm nodule in the left lower lobe. Electronically Signed   By: Nelson Chimes M.D.   On: 11/06/2017 11:48   Dg Chest Portable 1 View  Result Date: 11/05/2017 CLINICAL DATA:  Nonproductive cough, respiratory distress. EXAM: PORTABLE CHEST 1 VIEW COMPARISON:  CT scan of June 06, 2017. Radiographs of April 30, 2016. FINDINGS: Stable cardiomediastinal silhouette. Interval development of bilateral perihilar and basilar interstitial densities are noted consistent with pulmonary edema. No pneumothorax or significant pleural effusion is noted. Bony thorax is unremarkable. IMPRESSION: Interval development of bilateral pulmonary edema. Electronically Signed   By: Marijo Conception, M.D.   On: 11/05/2017 20:23     ASSESSMENT AND PLAN:   62 year old female with a history of remote tobacco dependence and mild nonobstructive CAD who presents with shortness of breath  1.  Acute diastolic heart failure with preserved ejection fraction by echocardiogram: Continue IV Lasix/potassium supplementation Monitor intake and output with daily weight Continue metoprolol with decreased dose due to low blood pressure Appreciate cardiology consultation   2.  Hypokalemia: This is been repleted  3.  Essential hypertension: Continue metoprolol  4.  Nonobstructive CAD: Echocardiogram shows normal ejection fraction with grade 2  diastolic dysfunction and There is hypokinesis of   the anteroseptal and anterior myocardium Plan for cardiac catheterization tomorrow.   Continue aspirin, statin and metoprolol  5.  Depression: Continue Effexor and Seroquel  6.  Diabetes: Continue sliding scale with ADA diet  7. Tobacco dependence: Patient is encouraged to quit smoking. Counseling was provided for 4 minutes. Patient quit smoking in March.  Patient is encouraged to continue to remain abstinent.  Continue nicotine patch.  Management plans discussed with the patient and husband and they are in agreement.  CODE STATUS: Full  TOTAL TIME TAKING CARE OF THIS PATIENT: 24 minutes.     POSSIBLE D/C over the weekend DEPENDING ON CLINICAL CONDITION.   Merlen Gurry M.D on 11/07/2017 at 11:25 AM  Between 7am to 6pm - Pager - [phone] After 6pm go to [email or web address] - password [email or web address] [facility]  Office  [phone]  CC: Primary care physician; [name], MD  Note: This dictation was prepared with Dragon dictation along with smaller phrase technology. Any transcriptional errors that result from this process are unintentional.

## 2017-11-07 NOTE — H&P (View-Only) (Signed)
Progress Note  Patient Name: Katelyn Cooper Date of Encounter: 11/07/2017  Primary Cardiologist: Nelva Bush, MD  Subjective   No chest pain.  Still short of breath with minimal activity such as walking to the bathroom.  She is also not able to lie flat due to persistent orthopnea.  Patient feels like her urine output was not as good yesterday after furosemide was decreased.  Inpatient Medications    Scheduled Meds: . [START ON 11/08/2017] aspirin  81 mg Oral Pre-Cath  . aspirin EC  81 mg Oral Daily  . atorvastatin  40 mg Oral Daily  . cholecalciferol  5,000 Units Oral Daily  . docusate sodium  100 mg Oral BID  . famotidine  10 mg Oral Daily  . feeding supplement (ENSURE ENLIVE)  237 mL Oral TID BM  . furosemide  40 mg Intravenous BID  . heparin  5,000 Units Subcutaneous Q8H  . insulin aspart  0-5 Units Subcutaneous QHS  . insulin aspart  0-9 Units Subcutaneous TID WC  . ipratropium-albuterol  3 mL Nebulization Q6H  . metoprolol tartrate  12.5 mg Oral BID  . multivitamin with minerals  1 tablet Oral Daily  . nicotine  21 mg Transdermal Q24H  . pantoprazole  40 mg Oral Daily  . potassium chloride  20 mEq Oral BID  . QUEtiapine  50 mg Oral QHS  . sodium chloride flush  3 mL Intravenous Q12H  . sodium chloride flush  3 mL Intravenous Q12H  . venlafaxine XR  75 mg Oral Q breakfast   Continuous Infusions: . sodium chloride    . [START ON 11/08/2017] sodium chloride     PRN Meds: sodium chloride, acetaminophen **OR** acetaminophen, bisacodyl, HYDROcodone-acetaminophen, ondansetron **OR** ondansetron (ZOFRAN) IV, sodium chloride flush, traZODone, zolpidem   Vital Signs    Vitals:   11/07/17 0211 11/07/17 0417 11/07/17 0742 11/07/17 0750  BP:  114/68  107/77  Pulse:  87  81  Resp:    18  Temp:  98 F (36.7 C)  97.6 F (36.4 C)  TempSrc:  Oral  Oral  SpO2: 98% 96% 97% 95%  Weight:  71.2 kg    Height:        Intake/Output Summary (Last 24 hours) at 11/07/2017 1137 Last  data filed at 11/07/2017 1106 Gross per 24 hour  Intake 240 ml  Output 2500 ml  Net -2260 ml   Filed Weights   11/05/17 2247 11/06/17 0333 11/07/17 0417  Weight: 73.3 kg 71.4 kg 71.2 kg    Telemetry    Normal sinus rhythm and sinus tachycardia - Personally Reviewed  ECG    No new tracing  Physical Exam   GEN: No acute distress.   Neck:  JVP approximately 8 cm Cardiac: RRR, no murmurs, rubs, or gallops.  Respiratory:  Mildly diminished breath sounds at the bases.  No wheezes or crackles. GI: Soft, nontender, non-distended  MS: No edema; No deformity. Neuro:  Nonfocal  Psych: Normal affect   Labs    Chemistry Recent Labs  Lab 11/05/17 1955 11/06/17 0515 11/07/17 0535  NA 138 138 140  K 3.0* 3.4* 3.8  CL 105 103 104  CO2 23 25 26   GLUCOSE 173* 213* 122*  BUN 6* 7* 13  CREATININE 1.04* 1.03* 1.01*  CALCIUM 8.3* 8.3* 8.6*  PROT 7.3  --   --   ALBUMIN 3.2*  --   --   AST 36  --   --   ALT 11  --   --  ALKPHOS 120  --   --   BILITOT 0.7  --   --   GFRNONAA 56* 57* 58*  GFRAA >60 >60 >60  ANIONGAP 10 10 10      Hematology Recent Labs  Lab 11/05/17 1955 11/06/17 0515  WBC 10.1 8.7  RBC 3.87 3.72*  HGB 10.7* 10.4*  HCT 32.3* 31.1*  MCV 83.5 83.6  MCH 27.6 27.9  MCHC 33.1 33.4  RDW 18.1* 18.1*  PLT 193 181    Cardiac Enzymes Recent Labs  Lab 11/05/17 1955  TROPONINI <0.03   No results for input(s): TROPIPOC in the last 168 hours.   BNP Recent Labs  Lab 11/05/17 1955  BNP 731.0*     DDimer No results for input(s): DDIMER in the last 168 hours.   Radiology    Ct Angio Chest Pe W Or Wo Contrast  Result Date: 11/06/2017 CLINICAL DATA:  Shortness of breath and congestive heart failure. EXAM: CT ANGIOGRAPHY CHEST WITH CONTRAST TECHNIQUE: Multidetector CT imaging of the chest was performed using the standard protocol during bolus administration of intravenous contrast. Multiplanar CT image reconstructions and MIPs were obtained to evaluate the  vascular anatomy. CONTRAST:  42mL ISOVUE-370 IOPAMIDOL (ISOVUE-370) INJECTION 76% COMPARISON:  Radiography 11/05/2017.  CT 06/06/2017. FINDINGS: Cardiovascular: Pulmonary arterial opacification is good. There are no pulmonary emboli. Heart size is normal. No pericardial fluid. There is aortic atherosclerosis but no aneurysm or dissection. There is coronary artery calcification. Mediastinum/Nodes: Mild mediastinal nodal prominence, probably reactive 2 congestive heart failure. Lungs/Pleura: Background emphysema. Interstitial pulmonary edema. Bilateral effusions with dependent pulmonary atelectasis. 4 mm subpleural nodule in the left lower lobe is unchanged and benign. Upper Abdomen: Early cirrhosis as suggested previously. Musculoskeletal: Normal Review of the MIP images confirms the above findings. IMPRESSION: Findings consistent with congestive heart failure. Interstitial edema. Bilateral effusions layering dependently with dependent atelectasis. Background emphysema. Aortic atherosclerosis. Coronary artery calcification. No pulmonary emboli. No change in a subpleural benign 4 mm nodule in the left lower lobe. Electronically Signed   By: Nelson Chimes M.D.   On: 11/06/2017 11:48   Dg Chest Portable 1 View  Result Date: 11/05/2017 CLINICAL DATA:  Nonproductive cough, respiratory distress. EXAM: PORTABLE CHEST 1 VIEW COMPARISON:  CT scan of June 06, 2017. Radiographs of April 30, 2016. FINDINGS: Stable cardiomediastinal silhouette. Interval development of bilateral perihilar and basilar interstitial densities are noted consistent with pulmonary edema. No pneumothorax or significant pleural effusion is noted. Bony thorax is unremarkable. IMPRESSION: Interval development of bilateral pulmonary edema. Electronically Signed   By: Marijo Conception, M.D.   On: 11/05/2017 20:23    Cardiac Studies   Echo (11/06/17): - Left ventricle: The cavity size was normal. Wall thickness was   normal. Systolic function was  normal. The estimated ejection   fraction was in the range of 50% to 55%. There is hypokinesis of   the anteroseptal and anterior myocardium. Features are consistent   with a pseudonormal left ventricular filling pattern, with   concomitant abnormal relaxation and increased filling pressure   (grade 2 diastolic dysfunction). Doppler parameters are   consistent with high ventricular filling pressure. - Mitral valve: There was mild regurgitation. - Right ventricle: The cavity size was normal. Wall thickness was   normal. Systolic function was normal. - Pulmonary arteries: Systolic pressure was mildly increased,   estimated to be 35 mm Hg.  Patient Profile     61 y.o. female woman with history of nonobstructive CAD, hypertension, hyperlipidemia, peripheral vascular disease,  OSA, and GERD, admitted with recent worsening of shortness of breath and orthopnea and found to have acute diastolic heart failure and new wall motion abnormality by echo.  Assessment & Plan    Acute diastolic heart failure Symptomatically, Ms. Henrie only slightly improved since yesterday.  Diuresis was significantly de-escalated from the ED, which may account for this.  She is not able to lie flat and still has exertional shortness of breath with minimal activity.  CTA of the chest yesterday showed no evidence of PE but lung findings consistent with heart failure.  Increase furosemide to 40 mg IV twice daily.  Continue metoprolol tartrate 12.5 mg twice daily.  Creatinine stable.  Coronary artery disease Patient is not had any angina, but her echo shows a new anterior/anteroseptal wall motion abnormality.  Initial troponin on arrival was negative.  Catheterization in 01/2016, which I have personally reviewed, does not show any significant CAD in the left coronary artery.  It is possible that interval development of IVCD could account for some of her wall motion abnormality, though I am still concerned that LAD ischemia  may be the culprit.  We have discussed further work-up and have agreed to proceed with cardiac catheterization when Ms. Bencomo is able to lie flat.  Anticipate left and right heart catheterization with possible PCI tomorrow.  I have reviewed the risks, indications, and alternatives to cardiac catheterization, possible angioplasty, and stenting with the patient. Risks include but are not limited to bleeding, infection, vascular injury, stroke, myocardial infection, arrhythmia, kidney injury, radiation-related injury in the case of prolonged fluoroscopy use, emergency cardiac surgery, and death. The patient understands the risks of serious complication is 1-2 in 9563 with diagnostic cardiac cath and 1-2% or less with angioplasty/stenting.  N.p.o. after midnight in anticipation of catheterization.  Continue aspirin and atorvastatin.  I will add a troponin onto this morning labs to ensure that there has not been a bump.  However, she has not had any chest pain to suggest acute MI.  For questions or updates, please contact [place] Please consult [email or web address] for contact info under [facility].   Signed, Nelva Bush, MD  11/07/2017, 11:37 AM

## 2017-11-07 NOTE — Progress Notes (Signed)
Progress Note  Patient Name: Katelyn Cooper Date of Encounter: 11/07/2017  Primary Cardiologist: Nelva Bush, MD  Subjective   No chest pain.  Still short of breath with minimal activity such as walking to the bathroom.  She is also not able to lie flat due to persistent orthopnea.  Patient feels like her urine output was not as good yesterday after furosemide was decreased.  Inpatient Medications    Scheduled Meds: . [START ON 11/08/2017] aspirin  81 mg Oral Pre-Cath  . aspirin EC  81 mg Oral Daily  . atorvastatin  40 mg Oral Daily  . cholecalciferol  5,000 Units Oral Daily  . docusate sodium  100 mg Oral BID  . famotidine  10 mg Oral Daily  . feeding supplement (ENSURE ENLIVE)  237 mL Oral TID BM  . furosemide  40 mg Intravenous BID  . heparin  5,000 Units Subcutaneous Q8H  . insulin aspart  0-5 Units Subcutaneous QHS  . insulin aspart  0-9 Units Subcutaneous TID WC  . ipratropium-albuterol  3 mL Nebulization Q6H  . metoprolol tartrate  12.5 mg Oral BID  . multivitamin with minerals  1 tablet Oral Daily  . nicotine  21 mg Transdermal Q24H  . pantoprazole  40 mg Oral Daily  . potassium chloride  20 mEq Oral BID  . QUEtiapine  50 mg Oral QHS  . sodium chloride flush  3 mL Intravenous Q12H  . sodium chloride flush  3 mL Intravenous Q12H  . venlafaxine XR  75 mg Oral Q breakfast   Continuous Infusions: . sodium chloride    . [START ON 11/08/2017] sodium chloride     PRN Meds: sodium chloride, acetaminophen **OR** acetaminophen, bisacodyl, HYDROcodone-acetaminophen, ondansetron **OR** ondansetron (ZOFRAN) IV, sodium chloride flush, traZODone, zolpidem   Vital Signs    Vitals:   11/07/17 0211 11/07/17 0417 11/07/17 0742 11/07/17 0750  BP:  114/68  107/77  Pulse:  87  81  Resp:    18  Temp:  98 F (36.7 C)  97.6 F (36.4 C)  TempSrc:  Oral  Oral  SpO2: 98% 96% 97% 95%  Weight:  71.2 kg    Height:        Intake/Output Summary (Last 24 hours) at 11/07/2017 1137 Last  data filed at 11/07/2017 1106 Gross per 24 hour  Intake 240 ml  Output 2500 ml  Net -2260 ml   Filed Weights   11/05/17 2247 11/06/17 0333 11/07/17 0417  Weight: 73.3 kg 71.4 kg 71.2 kg    Telemetry    Normal sinus rhythm and sinus tachycardia - Personally Reviewed  ECG    No new tracing  Physical Exam   GEN: No acute distress.   Neck:  JVP approximately 8 cm Cardiac: RRR, no murmurs, rubs, or gallops.  Respiratory:  Mildly diminished breath sounds at the bases.  No wheezes or crackles. GI: Soft, nontender, non-distended  MS: No edema; No deformity. Neuro:  Nonfocal  Psych: Normal affect   Labs    Chemistry Recent Labs  Lab 11/05/17 1955 11/06/17 0515 11/07/17 0535  NA 138 138 140  K 3.0* 3.4* 3.8  CL 105 103 104  CO2 23 25 26   GLUCOSE 173* 213* 122*  BUN 6* 7* 13  CREATININE 1.04* 1.03* 1.01*  CALCIUM 8.3* 8.3* 8.6*  PROT 7.3  --   --   ALBUMIN 3.2*  --   --   AST 36  --   --   ALT 11  --   --  ALKPHOS 120  --   --   BILITOT 0.7  --   --   GFRNONAA 56* 57* 58*  GFRAA >60 >60 >60  ANIONGAP 10 10 10      Hematology Recent Labs  Lab 11/05/17 1955 11/06/17 0515  WBC 10.1 8.7  RBC 3.87 3.72*  HGB 10.7* 10.4*  HCT 32.3* 31.1*  MCV 83.5 83.6  MCH 27.6 27.9  MCHC 33.1 33.4  RDW 18.1* 18.1*  PLT 193 181    Cardiac Enzymes Recent Labs  Lab 11/05/17 1955  TROPONINI <0.03   No results for input(s): TROPIPOC in the last 168 hours.   BNP Recent Labs  Lab 11/05/17 1955  BNP 731.0*     DDimer No results for input(s): DDIMER in the last 168 hours.   Radiology    Ct Angio Chest Pe W Or Wo Contrast  Result Date: 11/06/2017 CLINICAL DATA:  Shortness of breath and congestive heart failure. EXAM: CT ANGIOGRAPHY CHEST WITH CONTRAST TECHNIQUE: Multidetector CT imaging of the chest was performed using the standard protocol during bolus administration of intravenous contrast. Multiplanar CT image reconstructions and MIPs were obtained to evaluate the  vascular anatomy. CONTRAST:  44mL ISOVUE-370 IOPAMIDOL (ISOVUE-370) INJECTION 76% COMPARISON:  Radiography 11/05/2017.  CT 06/06/2017. FINDINGS: Cardiovascular: Pulmonary arterial opacification is good. There are no pulmonary emboli. Heart size is normal. No pericardial fluid. There is aortic atherosclerosis but no aneurysm or dissection. There is coronary artery calcification. Mediastinum/Nodes: Mild mediastinal nodal prominence, probably reactive 2 congestive heart failure. Lungs/Pleura: Background emphysema. Interstitial pulmonary edema. Bilateral effusions with dependent pulmonary atelectasis. 4 mm subpleural nodule in the left lower lobe is unchanged and benign. Upper Abdomen: Early cirrhosis as suggested previously. Musculoskeletal: Normal Review of the MIP images confirms the above findings. IMPRESSION: Findings consistent with congestive heart failure. Interstitial edema. Bilateral effusions layering dependently with dependent atelectasis. Background emphysema. Aortic atherosclerosis. Coronary artery calcification. No pulmonary emboli. No change in a subpleural benign 4 mm nodule in the left lower lobe. Electronically Signed   By: Nelson Chimes M.D.   On: 11/06/2017 11:48   Dg Chest Portable 1 View  Result Date: 11/05/2017 CLINICAL DATA:  Nonproductive cough, respiratory distress. EXAM: PORTABLE CHEST 1 VIEW COMPARISON:  CT scan of June 06, 2017. Radiographs of April 30, 2016. FINDINGS: Stable cardiomediastinal silhouette. Interval development of bilateral perihilar and basilar interstitial densities are noted consistent with pulmonary edema. No pneumothorax or significant pleural effusion is noted. Bony thorax is unremarkable. IMPRESSION: Interval development of bilateral pulmonary edema. Electronically Signed   By: Marijo Conception, M.D.   On: 11/05/2017 20:23    Cardiac Studies   Echo (11/06/17): - Left ventricle: The cavity size was normal. Wall thickness was   normal. Systolic function was  normal. The estimated ejection   fraction was in the range of 50% to 55%. There is hypokinesis of   the anteroseptal and anterior myocardium. Features are consistent   with a pseudonormal left ventricular filling pattern, with   concomitant abnormal relaxation and increased filling pressure   (grade 2 diastolic dysfunction). Doppler parameters are   consistent with high ventricular filling pressure. - Mitral valve: There was mild regurgitation. - Right ventricle: The cavity size was normal. Wall thickness was   normal. Systolic function was normal. - Pulmonary arteries: Systolic pressure was mildly increased,   estimated to be 35 mm Hg.  Patient Profile     62 y.o. female woman with history of nonobstructive CAD, hypertension, hyperlipidemia, peripheral vascular disease,  OSA, and GERD, admitted with recent worsening of shortness of breath and orthopnea and found to have acute diastolic heart failure and new wall motion abnormality by echo.  Assessment & Plan    Acute diastolic heart failure Symptomatically, Ms. Rodenberg only slightly improved since yesterday.  Diuresis was significantly de-escalated from the ED, which may account for this.  She is not able to lie flat and still has exertional shortness of breath with minimal activity.  CTA of the chest yesterday showed no evidence of PE but lung findings consistent with heart failure.  Increase furosemide to 40 mg IV twice daily.  Continue metoprolol tartrate 12.5 mg twice daily.  Creatinine stable.  Coronary artery disease Patient is not had any angina, but her echo shows a new anterior/anteroseptal wall motion abnormality.  Initial troponin on arrival was negative.  Catheterization in 01/2016, which I have personally reviewed, does not show any significant CAD in the left coronary artery.  It is possible that interval development of IVCD could account for some of her wall motion abnormality, though I am still concerned that LAD ischemia  may be the culprit.  We have discussed further work-up and have agreed to proceed with cardiac catheterization when Ms. Ratchford is able to lie flat.  Anticipate left and right heart catheterization with possible PCI tomorrow.  I have reviewed the risks, indications, and alternatives to cardiac catheterization, possible angioplasty, and stenting with the patient. Risks include but are not limited to bleeding, infection, vascular injury, stroke, myocardial infection, arrhythmia, kidney injury, radiation-related injury in the case of prolonged fluoroscopy use, emergency cardiac surgery, and death. The patient understands the risks of serious complication is 1-2 in 8882 with diagnostic cardiac cath and 1-2% or less with angioplasty/stenting.  N.p.o. after midnight in anticipation of catheterization.  Continue aspirin and atorvastatin.  I will add a troponin onto this morning labs to ensure that there has not been a bump.  However, she has not had any chest pain to suggest acute MI.  For questions or updates, please contact [place] Please consult [email or web address] for contact info under [facility].   Signed, Nelva Bush, MD  11/07/2017, 11:37 AM

## 2017-11-07 NOTE — Progress Notes (Signed)
Initial Nutrition Assessment  DOCUMENTATION CODES:   Not applicable  INTERVENTION:   Ensure Enlive po BID, each supplement provides 350 kcal and 20 grams of protein  MVI daily  Liberalize diet  NUTRITION DIAGNOSIS:   Inadequate oral intake related to acute illness as evidenced by meal completion < 50%.  GOAL:   Patient will meet greater than or equal to 90% of their needs  MONITOR:   PO intake, Supplement acceptance, Labs, Weight trends, Skin, I & O's  REASON FOR ASSESSMENT:   Consult Diet education  ASSESSMENT:   62 y.o. female known to Dr. Saunders Revel with a history of nonobstructive CAD s/p LHC (2017) and 2017 TTE with normal EF, HLD, DM2, PVD with prior L iliac stent in the 1990s, thoracic outlet syndrome s/p b/l rib resections in 1990, OSA (on CPAP), GERD complicated by esophageal stricture, anxiety / depression who is admitted for SOB 2/2 CHF exacerbation    Met with pt in room today. Pt reports decreased appetite and oral intake for 1 week pta. Pt eating 25% of meals in hospital. Pt is willing to drink chocolate Ensure until her appetite improves. Pt reports her UBW ~165lbs. Per chart, pt has lost 10lbs(6%) over the past year; this is not significant. RD will liberalize diet and add supplements to encourage increased po intake. Pt provided with low sodium diet education today.   Medications reviewed and include: aspirin, vitamin D, colace, lasix, pepcid, heparin, insulin, nicotine, protonix, KCl  Labs reviewed: K 3.8 wnl, P 3.2 wnl, Mg 2.3 wnl BNP- 731(H)- 8/6 cbgs- 167, 179, 131, 190, 106 x 24 hrs AIC 6.5(H)- 08/2016  NUTRITION - FOCUSED PHYSICAL EXAM:    Most Recent Value  Orbital Region  No depletion  Upper Arm Region  No depletion  Thoracic and Lumbar Region  No depletion  Buccal Region  No depletion  Temple Region  No depletion  Clavicle Bone Region  No depletion  Clavicle and Acromion Bone Region  No depletion  Scapular Bone Region  No depletion  Dorsal Hand   No depletion  Patellar Region  No depletion  Anterior Thigh Region  No depletion  Posterior Calf Region  No depletion  Edema (RD Assessment)  Mild [BLE]  Hair  Reviewed  Eyes  Reviewed  Mouth  Reviewed  Skin  Reviewed  Nails  Reviewed     Diet Order:   Diet Order            Diet NPO time specified Except for: Sips with Meds  Diet effective midnight        Diet heart healthy/carb modified Room service appropriate? Yes; Fluid consistency: Thin  Diet effective now             EDUCATION NEEDS:   Education needs have been addressed  Skin:  Skin Assessment: Reviewed RN Assessment(wound left nare )  Last BM:  8/6  Height:   Ht Readings from Last 1 Encounters:  11/05/17 '5\' 3"'  (1.6 m)    Weight:   Wt Readings from Last 1 Encounters:  11/07/17 71.2 kg    Ideal Body Weight:  52.3 kg  BMI:  Body mass index is 27.81 kg/m.  Estimated Nutritional Needs:   Kcal:  1500-1700kcal/day   Protein:  71-78g/day   Fluid:  >1.3L/day or per MD  Koleen Distance MS, RD, LDN Pager #- [phone] Office#- [phone] After Hours Pager: [phone]

## 2017-11-07 NOTE — Progress Notes (Signed)
Pt refused to wear CPAP last night. Pt stated she was fine without it. Encouraged pt to let nurse know if she changed her mind. No distress noted.

## 2017-11-08 ENCOUNTER — Encounter: Payer: Self-pay | Admitting: Cardiovascular Disease

## 2017-11-08 ENCOUNTER — Encounter
Admission: EM | Disposition: A | Discharge status: Home / Self Care | Payer: Self-pay | Source: Home / Self Care | Attending: Internal Medicine

## 2017-11-08 HISTORY — PX: RIGHT/LEFT HEART CATH AND CORONARY ANGIOGRAPHY: CATH118266

## 2017-11-08 LAB — BASIC METABOLIC PANEL
Anion gap: 13 (ref 5–15)
BUN: 17 mg/dL (ref 8–23)
CO2: 28 mmol/L (ref 22–32)
CREATININE: 1.12 mg/dL — AB (ref 0.44–1.00)
Calcium: 8.6 mg/dL — ABNORMAL LOW (ref 8.9–10.3)
Chloride: 99 mmol/L (ref 98–111)
GFR calc Af Amer: 59 mL/min — ABNORMAL LOW (ref >60)
GFR, EST NON AFRICAN AMERICAN: 51 mL/min — AB (ref >60)
GLUCOSE: 107 mg/dL — AB (ref 70–99)
Potassium: 3.9 mmol/L (ref 3.5–5.1)
SODIUM: 140 mmol/L (ref 135–145)

## 2017-11-08 LAB — GLUCOSE, CAPILLARY: Glucose-Capillary: 122 mg/dL — ABNORMAL HIGH (ref 70–99)

## 2017-11-08 LAB — PHOSPHORUS: Phosphorus: 3.5 mg/dL (ref 2.5–4.6)

## 2017-11-08 LAB — CARDIAC CATHETERIZATION: CATHEFQUANT: 55 %

## 2017-11-08 LAB — MAGNESIUM: Magnesium: 2.5 mg/dL — ABNORMAL HIGH (ref 1.7–2.4)

## 2017-11-08 SURGERY — RIGHT/LEFT HEART CATH AND CORONARY ANGIOGRAPHY
Anesthesia: Moderate Sedation

## 2017-11-08 MED ORDER — METOPROLOL TARTRATE 25 MG PO TABS: 12.5000 mg | ORAL_TABLET | Freq: Two times a day (BID) | ORAL | 0 refills | Status: DC

## 2017-11-08 MED ORDER — POTASSIUM CHLORIDE CRYS ER 20 MEQ PO TBCR: 20.0000 meq | EXTENDED_RELEASE_TABLET | Freq: Two times a day (BID) | ORAL | Status: DC

## 2017-11-08 MED ORDER — FUROSEMIDE 40 MG PO TABS: 40.0000 mg | ORAL_TABLET | Freq: Every day | ORAL | 0 refills | Status: DC

## 2017-11-08 MED ORDER — HEPARIN (PORCINE) IN NACL 1000-0.9 UT/500ML-% IV SOLN
INTRAVENOUS | Status: DC | PRN
  Administered 2017-11-08: 500 mL

## 2017-11-08 MED ORDER — MIDAZOLAM HCL 2 MG/2ML IJ SOLN
INTRAMUSCULAR | Status: AC
  Filled 2017-11-08: qty 2

## 2017-11-08 MED ORDER — FENTANYL CITRATE (PF) 100 MCG/2ML IJ SOLN
INTRAMUSCULAR | Status: AC
  Filled 2017-11-08: qty 2

## 2017-11-08 MED ORDER — POTASSIUM CHLORIDE CRYS ER 20 MEQ PO TBCR: 20.0000 meq | EXTENDED_RELEASE_TABLET | Freq: Every day | ORAL | Status: DC

## 2017-11-08 MED ORDER — HEPARIN SODIUM (PORCINE) 1000 UNIT/ML IJ SOLN
INTRAMUSCULAR | Status: DC | PRN
  Administered 2017-11-08: 3500 [IU] via INTRAVENOUS

## 2017-11-08 MED ORDER — MIDAZOLAM HCL 2 MG/2ML IJ SOLN
INTRAMUSCULAR | Status: DC | PRN
  Administered 2017-11-08: 1 mg via INTRAVENOUS

## 2017-11-08 MED ORDER — SODIUM CHLORIDE 0.9 % IV SOLN: 250.0000 mL | INTRAVENOUS | Status: DC | PRN

## 2017-11-08 MED ORDER — FENTANYL CITRATE (PF) 100 MCG/2ML IJ SOLN
INTRAMUSCULAR | Status: DC | PRN
  Administered 2017-11-08: 25 ug via INTRAVENOUS

## 2017-11-08 MED ORDER — SODIUM CHLORIDE 0.9% FLUSH: 3.0000 mL | Freq: Two times a day (BID) | INTRAVENOUS | Status: DC

## 2017-11-08 MED ORDER — HEPARIN (PORCINE) IN NACL 1000-0.9 UT/500ML-% IV SOLN
INTRAVENOUS | Status: AC
  Filled 2017-11-08: qty 1000

## 2017-11-08 MED ORDER — SODIUM CHLORIDE 0.9% FLUSH: 3.0000 mL | INTRAVENOUS | Status: DC | PRN

## 2017-11-08 MED ORDER — LIDOCAINE HCL (PF) 1 % IJ SOLN
INTRAMUSCULAR | Status: AC
  Filled 2017-11-08: qty 30

## 2017-11-08 MED ORDER — HEPARIN SODIUM (PORCINE) 1000 UNIT/ML IJ SOLN
INTRAMUSCULAR | Status: AC
  Filled 2017-11-08: qty 1

## 2017-11-08 MED ORDER — VERAPAMIL HCL 2.5 MG/ML IV SOLN
INTRAVENOUS | Status: AC
  Filled 2017-11-08: qty 2

## 2017-11-08 MED ORDER — VERAPAMIL HCL 2.5 MG/ML IV SOLN
INTRAVENOUS | Status: DC | PRN
  Administered 2017-11-08: 2.5 mg via INTRA_ARTERIAL

## 2017-11-08 MED ORDER — FUROSEMIDE 40 MG PO TABS
40.0000 mg | ORAL_TABLET | Freq: Every day | ORAL | Status: DC
  Administered 2017-11-08: 40 mg via ORAL
  Filled 2017-11-08 (×4): qty 1

## 2017-11-08 MED ORDER — IOPAMIDOL (ISOVUE-300) INJECTION 61%
INTRAVENOUS | Status: DC | PRN
  Administered 2017-11-08: 40 mL via INTRA_ARTERIAL

## 2017-11-08 SURGICAL SUPPLY — 10 items
CATH BALLN WEDGE 5F 110CM (CATHETERS) ×1 IMPLANT
CATH INFINITI 5FR JK (CATHETERS) ×1 IMPLANT
DEVICE RAD TR BAND REGULAR (VASCULAR PRODUCTS) ×1 IMPLANT
KIT MANI 3VAL PERCEP (MISCELLANEOUS) ×2 IMPLANT
KIT RIGHT HEART (MISCELLANEOUS) ×1 IMPLANT
NDL PERC 21GX4CM (NEEDLE) IMPLANT
NEEDLE PERC 21GX4CM (NEEDLE) ×2 IMPLANT
PACK CARDIAC CATH (CUSTOM PROCEDURE TRAY) ×2 IMPLANT
SHEATH RAIN 4/5FR (SHEATH) ×1 IMPLANT
SHEATH RAIN RADIAL 21G 6FR (SHEATH) ×1 IMPLANT

## 2017-11-08 NOTE — Progress Notes (Signed)
Discharged via wheelchair and private vehicle.

## 2017-11-08 NOTE — Discharge Summary (Signed)
[place] [facility] [place] NAME: Katelyn Cooper    MR#:  [id number]  DATE OF BIRTH:  21-Feb-1956  DATE OF ADMISSION:  11/05/2017 ADMITTING PHYSICIAN: Amelia Jo, MD  DATE OF DISCHARGE: 11/07/2017  PRIMARY CARE PHYSICIAN: [name], MD    ADMISSION DIAGNOSIS:  Breathing difficulty  DISCHARGE DIAGNOSIS:  Active Problems:   Acute exacerbation of CHF (congestive heart failure) (HCC)   Acute diastolic heart failure ([place])   SECONDARY DIAGNOSIS:   Past Medical History:  Diagnosis Date  . Anxiety   . Depression   . Diabetes type 2, controlled ([place])    diet-controlled  . Esophageal stricture   . GERD (gastroesophageal reflux disease)   . Hiatal hernia   . History of chicken pox   . Hyperlipidemia   . Internal hemorrhoids   . Panic disorder     HOSPITAL COURSE:   62 year old female with a history of remote tobacco dependence and mild nonobstructive CAD who presents with shortness of breath  1.  Acute diastolic heart failure with preserved ejection fraction by echocardiogram: She has diuresed well with IV Lasix. She is referred to CHF clinic upon discharge.  She will discharge on oral Lasix.  2.  Hypokalemia: This was been repleted  3.  Essential hypertension: Continue metoprolol  4.  Nonobstructive CAD: Echocardiogram shows normal ejection fraction with grade 2 diastolic dysfunction and There is hypokinesis of the anteroseptal and anterior myocardium She underwent cardiac catheterization without any acute abnormalities.  Continue aspirin, statin and metoprolol  5.  Depression: Continue Effexor and Seroquel  6.  Diabetes: Continue  ADA diet  7. Tobacco dependence: Patient is encouraged to quit smoking. Counseling was provided for 4 minutes.   DISCHARGE CONDITIONS AND DIET:  Stable for discharge on heart healthy diet  CONSULTS OBTAINED:  Treatment Team:  Nelva Bush, MD  DRUG ALLERGIES:   Allergies  Allergen  Reactions  . Lovastatin     depression  . Paroxetine Hcl     itching  . Lamotrigine Rash    DISCHARGE MEDICATIONS:   Allergies as of 11/08/2017      Reactions   Lovastatin    depression   Paroxetine Hcl    itching   Lamotrigine Rash      Medication List    TAKE these medications   albuterol 108 (90 Base) MCG/ACT inhaler Commonly known as:  PROVENTIL HFA;VENTOLIN HFA Inhale 2 puffs into the lungs every 4 (four) hours as needed for wheezing or shortness of breath.   aspirin EC 81 MG tablet Take 1 tablet (81 mg total) by mouth daily.   atorvastatin 40 MG tablet Commonly known as:  LIPITOR TAKE 1 TABLET BY MOUTH DAILY   CALCIUM 1000 + D PO Take 2 tablets by mouth daily.   furosemide 40 MG tablet Commonly known as:  LASIX Take 1 tablet (40 mg total) by mouth daily. Start taking on:  11/09/2017   glucose blood test strip 1 each by Other route as needed for other (ONE TOUCH ULTRA TEST STRIPS AND LANCETS). Use as instructed   metoprolol tartrate 25 MG tablet Commonly known as:  LOPRESSOR Take 0.5 tablets (12.5 mg total) by mouth 2 (two) times daily. What changed:  See the new instructions.   nicotine 21 mg/24hr patch Commonly known as:  NICODERM CQ - dosed in mg/24 hours Place 1 patch (21 mg total) onto the skin daily.   pantoprazole 40 MG tablet Commonly known as:  PROTONIX TAKE  1 TABLET BY MOUTH TWICE A DAY BEFORE A MEAL What changed:  See the new instructions.   QUEtiapine 100 MG tablet Commonly known as:  SEROQUEL Take 1 tablet (100 mg total) by mouth at bedtime. Pt has supply   ranitidine 150 MG tablet Commonly known as:  ZANTAC Take 150 mg by mouth 2 (two) times daily.   venlafaxine XR 75 MG 24 hr capsule Commonly known as:  EFFEXOR-XR Take 1 capsule (75 mg total) by mouth daily with breakfast.   Vitamin D3 5000 units Tabs Take 1 tablet by mouth daily.   zaleplon 10 MG capsule Commonly known as:  SONATA Take 1 capsule (10 mg total) by mouth at  bedtime as needed for sleep.         Today   CHIEF COMPLAINT:   Patient was seen in cardiac cath recovery.  She is doing well.  No shortness of breath or chest pain   VITAL SIGNS:  Blood pressure 114/62, pulse 82, temperature 98.3 F (36.8 C), temperature source Oral, resp. rate 19, height 5\' 3"  (1.6 m), weight 69.4 kg, SpO2 92 %.   REVIEW OF SYSTEMS:  Review of Systems  Constitutional: Negative.  Negative for chills, fever and malaise/fatigue.  HENT: Negative.  Negative for ear discharge, ear pain, hearing loss, nosebleeds and sore throat.   Eyes: Negative.  Negative for blurred vision and pain.  Respiratory: Negative.  Negative for cough, hemoptysis, shortness of breath and wheezing.   Cardiovascular: Negative.  Negative for chest pain, palpitations and leg swelling.  Gastrointestinal: Negative.  Negative for abdominal pain, blood in stool, diarrhea, nausea and vomiting.  Genitourinary: Negative.  Negative for dysuria.  Musculoskeletal: Negative.  Negative for back pain.  Skin: Negative.   Neurological: Negative for dizziness, tremors, speech change, focal weakness, seizures and headaches.  Endo/Heme/Allergies: Negative.  Does not bruise/bleed easily.  Psychiatric/Behavioral: Negative.  Negative for depression, hallucinations and suicidal ideas.     PHYSICAL EXAMINATION:  GENERAL:  62 y.o.-year-old patient lying in the bed with no acute distress.  NECK:  Supple, no jugular venous distention. No thyroid enlargement, no tenderness.  LUNGS: Normal breath sounds bilaterally, no wheezing, rales,rhonchi  No use of accessory muscles of respiration.  CARDIOVASCULAR: S1, S2 normal. No murmurs, rubs, or gallops.  ABDOMEN: Soft, non-tender, non-distended. Bowel sounds present. No organomegaly or mass.  EXTREMITIES: No pedal edema, cyanosis, or clubbing.  PSYCHIATRIC: The patient is alert and oriented x 3.  SKIN: No obvious rash, lesion, or ulcer.   DATA REVIEW:   CBC Recent  Labs  Lab 11/06/17 0515  WBC 8.7  HGB 10.4*  HCT 31.1*  PLT 181    Chemistries  Recent Labs  Lab 11/05/17 1955  11/08/17 0558  NA 138   < > 140  K 3.0*   < > 3.9  CL 105   < > 99  CO2 23   < > 28  GLUCOSE 173*   < > 107*  BUN 6*   < > 17  CREATININE 1.04*   < > 1.12*  CALCIUM 8.3*   < > 8.6*  MG  --    < > 2.5*  AST 36  --   --   ALT 11  --   --   ALKPHOS 120  --   --   BILITOT 0.7  --   --    < > = values in this interval not displayed.    Cardiac Enzymes Recent Labs  Lab 11/05/17 1955  11/07/17 0535  TROPONINI <0.03 0.03*    Microbiology Results  @MICRORSLT48 @  RADIOLOGY:  No results found.    Allergies as of 11/08/2017      Reactions   Lovastatin    depression   Paroxetine Hcl    itching   Lamotrigine Rash      Medication List    TAKE these medications   albuterol 108 (90 Base) MCG/ACT inhaler Commonly known as:  PROVENTIL HFA;VENTOLIN HFA Inhale 2 puffs into the lungs every 4 (four) hours as needed for wheezing or shortness of breath.   aspirin EC 81 MG tablet Take 1 tablet (81 mg total) by mouth daily.   atorvastatin 40 MG tablet Commonly known as:  LIPITOR TAKE 1 TABLET BY MOUTH DAILY   CALCIUM 1000 + D PO Take 2 tablets by mouth daily.   furosemide 40 MG tablet Commonly known as:  LASIX Take 1 tablet (40 mg total) by mouth daily. Start taking on:  11/09/2017   glucose blood test strip 1 each by Other route as needed for other (ONE TOUCH ULTRA TEST STRIPS AND LANCETS). Use as instructed   metoprolol tartrate 25 MG tablet Commonly known as:  LOPRESSOR Take 0.5 tablets (12.5 mg total) by mouth 2 (two) times daily. What changed:  See the new instructions.   nicotine 21 mg/24hr patch Commonly known as:  NICODERM CQ - dosed in mg/24 hours Place 1 patch (21 mg total) onto the skin daily.   pantoprazole 40 MG tablet Commonly known as:  PROTONIX TAKE 1 TABLET BY MOUTH TWICE A DAY BEFORE A MEAL What changed:  See the new  instructions.   QUEtiapine 100 MG tablet Commonly known as:  SEROQUEL Take 1 tablet (100 mg total) by mouth at bedtime. Pt has supply   ranitidine 150 MG tablet Commonly known as:  ZANTAC Take 150 mg by mouth 2 (two) times daily.   venlafaxine XR 75 MG 24 hr capsule Commonly known as:  EFFEXOR-XR Take 1 capsule (75 mg total) by mouth daily with breakfast.   Vitamin D3 5000 units Tabs Take 1 tablet by mouth daily.   zaleplon 10 MG capsule Commonly known as:  SONATA Take 1 capsule (10 mg total) by mouth at bedtime as needed for sleep.           Management plans discussed with the patient and she is in agreement. Stable for discharge home  Patient should follow up with cardiology  CODE STATUS:     Code Status Orders  (From admission, onward)         Start     Ordered   11/05/17 2247  Full code  Continuous     11/05/17 2246        Code Status History    Date Active Date Inactive Code Status Order ID Comments User Context   01/12/2016 0931 01/12/2016 1521 Full Code [id number]  Nelva Bush, MD Inpatient      TOTAL TIME TAKING CARE OF THIS PATIENT: 38 minutes.    Note: This dictation was prepared with Dragon dictation along with smaller phrase technology. Any transcriptional errors that result from this process are unintentional.  Rayder Sullenger M.D on 11/08/2017 at 12:01 PM  Between 7am to 6pm - Pager - [phone] After 6pm go to [email or web address] - password [email or web address] [facility]  Office  [phone]  CC: Primary care physician; [name], MD

## 2017-11-08 NOTE — Progress Notes (Signed)
Electrolyte replacement consult  11/08/2017 K 3.9, Ca 8.6, Mg 2.5, phos 3.5. Will give Klor-Con 20 mEq po BID x 2 doses followed by Klor-Con 20 mEq po daily and recheck all electrolytes tomorrow with AM labs.   Eames Dibiasio A. Jordan Hawks, PharmD, BCPS Clinical Pharmacist  11/08/17 7:36 AM

## 2017-11-08 NOTE — Interval H&P Note (Signed)
History and Physical Interval Note:  11/08/2017 9:26 AM  Katelyn Cooper  has presented today for surgery, with the diagnosis of Acute heart failure  The various methods of treatment have been discussed with the patient and family. After consideration of risks, benefits and other options for treatment, the patient has consented to  Procedure(s): RIGHT/LEFT HEART CATH AND CORONARY ANGIOGRAPHY (N/A) as a surgical intervention .  The patient's history has been reviewed, patient examined, no change in status, stable for surgery.  I have reviewed the patient's chart and labs.  Questions were answered to the patient's satisfaction.     [name]

## 2017-11-08 NOTE — Plan of Care (Signed)
  Problem: Skin Integrity: Goal: Risk for impaired skin integrity will decrease Outcome: Progressing   Problem: Education: Goal: Knowledge of General Education information will improve Description Including pain rating scale, medication(s)/side effects and non-pharmacologic comfort measures Outcome: Completed/Met

## 2017-11-11 ENCOUNTER — Telehealth: Payer: Self-pay

## 2017-11-11 NOTE — Telephone Encounter (Signed)
Transition Care Management Follow-up Telephone Call  Date of discharge and from where: [facility] on 11/08/17.  How have you been since you were released from the hospital? Doing ok, still feeling a bit tired. Has had some SOB with activity, but nothing like it was. Declines any other s/s.  Any questions or concerns? No   Items Reviewed:  Did the pt receive and understand the discharge instructions provided? Yes   Medications obtained and verified? No, pt declined and stated that she wanted to review these at the hospital follow up apt.  Any new allergies since your discharge? No   Dietary orders reviewed? Yes  Do you have support at home? Yes   Other (ie: DME, Home Health, etc) N/A  Functional Questionnaire: (I = Independent and D = Dependent) ADL's: I  Bathing/Dressing- I   Meal Prep- I  Eating- I  Maintaining continence- I  Transferring/Ambulation- I  Managing Meds- I   Follow up appointments reviewed:    PCP Hospital f/u appt confirmed? Yes  Scheduled to see [name] on 11/15/17 @ 4:20 PM.  [facility] f/u appt confirmed? Yes  Scheduled to see Dr End on 11/13/17 @ 1:40 PM.  Are transportation arrangements needed? No   If their condition worsens, is the pt aware to call  their PCP or go to the ED? Yes  Was the patient provided with contact information for the PCP's office or ED? Yes  Was the pt encouraged to call back with questions or concerns? Yes

## 2017-11-12 NOTE — Progress Notes (Signed)
Follow-up Outpatient Visit Date: 11/13/2017  Primary Care Provider: [name], [place] [address] [place] [address]  Chief Complaint: Follow-up hospitalization for acute diastolic heart failure  HPI:  Katelyn Cooper is a 62 y.o. year-old female with history of nonobstructive CAD, PAD status post left iliac stent, diet-controlled DM 2, thoracic outlet syndrome status post bilateral rib resections (4696), GERD complicated by esophageal stricture, HLD, and anxiety/depression, who presents for follow-up of acute diastolic heart failure for which she was admitted at [facility] last week.  She presented with rapidly progressive shortness of breath and accompanying hypoxia.  Echo showed relatively preserved LVEF albeit with anterior/anteroseptal hypokinesis in the setting of new IVCD.  She was diuresed with gradual improvement in her shortness of breath.  She also underwent left and right heart catheterization that showed stable nonobstructive CAD and normal left and right heart filling pressures in the setting of a reduced cardiac output/index.  She was discharged on furosemide 40 mg daily as well as metoprolol tartrate 25 mg twice daily.  Today, the patient reports that she is gradually improving.  She has only minimal shortness of breath and no further orthopnea.  She still has some generalized fatigue.  She is tolerating her medications well, though she has noted some intermittent orthostatic lightheadedness and is concerned this could be related to dehydration.  She has not had any chest pain or palpitations.  --------------------------------------------------------------------------------------------------  Cardiovascular History & Procedures: Cardiovascular Problems:  Nonobstructive coronary artery disease  Peripheral vascular status post left iliac stent  Risk Factors:  Diabetes mellitus, hyperlipidemia, peripheral vascular disease  Cath/PCI:  L/RHC (11/08/2017): LMCA with  minimal diffuse disease.  LAD normal.  30% stenosis at first septal perforator.  LCx with mild luminal irregularities.  OM1 with 30% disease.  RCA with sequential 30% proximal and mid stenoses.  LHC (01/12/16): LMCA and LAD normal. There is a 40% stenosis in the proximal aspect of the large first septal perforator. There is 30% OM1 stenosis. 30% proximal and mid RCA lesions are also present. LVEDP mildly elevated at 15-20 mmHg.  CV Surgery:  Remote left iliac stent.  EP Procedures and Devices:  None.  Non-Invasive Evaluation(s):  TTE (11/06/2017): Normal LV size and wall thickness.  LVEF 50 to 55% with hypokinesis of the anteroseptal and anterior myocardium.  Grade 2 diastolic dysfunction with elevated filling pressures.  Mild MR.  Normal RV size and function.  Mild pulmonary hypertension (PASP 35 mmHg).  Pharmacologic myocardial perfusion stress test (12/23/15): Small, mild area of reversible defect involving the apical anterior and apical segments that could reflect ischemia or breast attenuation. LVEF 55-65%. Borderline TID was noted.  Transthoracic echocardiogram (12/28/15): Normal LV systolic and diastolic function (EF 29-52%). No significant valvular abnormalities. Normal RV size and function.  Aortoiliac and lower extremity Dopplers: Patent stent in the left common iliac artery. No significant abnormalities. ABI right 1.05, left 1.03.  Recent CV Pertinent Labs: Lab Results  Component Value Date   CHOL 148 09/21/2016   HDL 40 09/21/2016   LDLCALC 73 09/21/2016   TRIG 175 (H) 09/21/2016   CHOLHDL 3.7 09/21/2016   INR 1.06 01/06/2016   BNP 731.0 (H) 11/05/2017   K 3.9 11/08/2017   MG 2.5 (H) 11/08/2017   BUN 17 11/08/2017   BUN 9 09/21/2016   CREATININE 1.12 (H) 11/08/2017    Past medical and surgical history were reviewed and updated in EPIC.  Current Meds  Medication Sig  . albuterol (PROAIR HFA) 108 (  90 Base) MCG/ACT inhaler Inhale 2 puffs into the lungs every 4  (four) hours as needed for wheezing or shortness of breath.  [name] aspirin EC 81 MG tablet Take 1 tablet (81 mg total) by mouth daily.  [name] atorvastatin (LIPITOR) 40 MG tablet TAKE 1 TABLET BY MOUTH DAILY  . Calcium Carb-Cholecalciferol (CALCIUM 1000 + D PO) Take 2 tablets by mouth daily.   . Cholecalciferol (VITAMIN D3) 5000 units TABS Take 1 tablet by mouth daily.   [name] glucose blood test strip 1 each by Other route as needed for other (ONE TOUCH ULTRA TEST STRIPS AND LANCETS). Use as instructed  . nicotine (NICODERM CQ - DOSED IN MG/24 HOURS) 21 mg/24hr patch Place 1 patch (21 mg total) onto the skin daily.  . pantoprazole (PROTONIX) 40 MG tablet TAKE 1 TABLET BY MOUTH TWICE A DAY BEFORE A MEAL (Patient taking differently: Take 40 mg by mouth daily. )  . QUEtiapine (SEROQUEL) 100 MG tablet Take 1 tablet (100 mg total) by mouth at bedtime. Pt has supply  . ranitidine (ZANTAC) 150 MG tablet Take 150 mg by mouth 2 (two) times daily.  [name] venlafaxine XR (EFFEXOR-XR) 75 MG 24 hr capsule Take 1 capsule (75 mg total) by mouth daily with breakfast.  . zaleplon (SONATA) 10 MG capsule Take 1 capsule (10 mg total) by mouth at bedtime as needed for sleep.  . [DISCONTINUED] furosemide (LASIX) 40 MG tablet Take 1 tablet (40 mg total) by mouth daily.    Allergies: Lovastatin; Paroxetine hcl; and Lamotrigine  Social History   Tobacco Use  . Smoking status: Current Every Day Smoker    Packs/day: 1.00    Years: 40.00    Pack years: 40.00    Types: Cigarettes    Start date: 03/01/1975    Last attempt to quit: 10/11/2016    Years since quitting: 1.0  . Smokeless tobacco: Never Used  Substance Use Topics  . Alcohol use: No    Alcohol/week: 0.0 standard drinks  . Drug use: No    Family History  Problem Relation Age of Onset  . Lung cancer Mother        was a smoker  . Emphysema Mother   . Cancer Mother        Lung  . Alcohol abuse Mother   . Depression Mother   . Heart disease Mother   . Drug abuse  Sister   . Breast cancer Sister   . Emphysema Sister   . Bipolar disorder Sister   . Alcohol abuse Other   . Depression Other   . Bipolar disorder Other   . Heart disease Other   . Vision loss Other   . Alcohol abuse Father   . Mood Disorder Father   . Heart disease Maternal Grandmother   . Stroke Maternal Grandmother   . Esophageal cancer Maternal Aunt   . Colon cancer Neg Hx     Review of Systems: A 12-system review of systems was performed and was negative except as noted in the HPI.  --------------------------------------------------------------------------------------------------  Physical Exam: BP 112/60 (BP Location: Left Arm, Patient Position: Sitting, Cuff Size: Normal)   Pulse 95   Ht 5\' 3"  (1.6 m)   Wt 151 lb 8 oz (68.7 kg)   BMI 26.84 kg/m   General: NAD.  Accompanied by friend. HEENT: No conjunctival pallor or scleral icterus. Moist mucous membranes.  OP clear. Neck: Supple without lymphadenopathy, thyromegaly, JVD, or HJR. Lungs: Normal work of breathing. Clear to auscultation  bilaterally without wheezes or crackles. Heart: Regular rate and rhythm without murmurs, rubs, or gallops. Non-displaced PMI. Abd: Bowel sounds present. Soft, NT/ND without hepatosplenomegaly Ext: No lower extremity edema.  Right radial arteriotomy site healed with minimal surrounding bruising. Skin: Warm and dry without rash.  EKG: Normal sinus rhythm with LBBB.  Since prior tracing from 11/05/2017, LBBB has replaced IVCD.  Lab Results  Component Value Date   WBC 8.7 11/06/2017   HGB 10.4 (L) 11/06/2017   HCT 31.1 (L) 11/06/2017   MCV 83.6 11/06/2017   PLT 181 11/06/2017    Lab Results  Component Value Date   NA 140 11/08/2017   K 3.9 11/08/2017   CL 99 11/08/2017   CO2 28 11/08/2017   BUN 17 11/08/2017   CREATININE 1.12 (H) 11/08/2017   GLUCOSE 107 (H) 11/08/2017   ALT 11 11/05/2017    Lab Results  Component Value Date   CHOL 148 09/21/2016   HDL 40 09/21/2016    LDLCALC 73 09/21/2016   TRIG 175 (H) 09/21/2016   CHOLHDL 3.7 09/21/2016    --------------------------------------------------------------------------------------------------  ASSESSMENT AND PLAN: Acute diastolic heart failure and nonischemic cardiomyopathy Ms. Snelson appears euvolemic today with continued NYHA class II symptoms.  Given that she does not appear volume overloaded on exam today and is also had intermittent orthostatic lightheadedness, I will decrease furosemide to 20 mg daily 2.  I will check a basic metabolic panel today.  We will not make any further medication changes at this time.  Underlying etiology of her heart failure exacerbation and new left bundle branch block remains unclear.  If symptoms persist, we may need to consider cardiac MRI to evaluate for infiltrative process.  Coronary artery disease Nonobstructive CAD noted on 2 prior catheterizations, most recently earlier this month.  She does not have any symptoms to suggest worsening coronary insufficiency.  We will continue her current medications for secondary prevention.  Follow-up: Return to clinic in 1 month.  Katelyn Bush, MD 11/13/2017 2:01 PM

## 2017-11-13 ENCOUNTER — Ambulatory Visit (INDEPENDENT_AMBULATORY_CARE_PROVIDER_SITE_OTHER): Payer: Managed Care, Other (non HMO) | Admitting: Internal Medicine

## 2017-11-13 ENCOUNTER — Encounter: Payer: Self-pay | Admitting: Internal Medicine

## 2017-11-13 VITALS — BP 112/60 | HR 95 | Ht 63.0 in | Wt 151.5 lb

## 2017-11-13 DIAGNOSIS — I428 Other cardiomyopathies: Secondary | ICD-10-CM | POA: Diagnosis not present

## 2017-11-13 DIAGNOSIS — I5031 Acute diastolic (congestive) heart failure: Secondary | ICD-10-CM

## 2017-11-13 DIAGNOSIS — I2510 Atherosclerotic heart disease of native coronary artery without angina pectoris: Secondary | ICD-10-CM | POA: Diagnosis not present

## 2017-11-13 MED ORDER — FUROSEMIDE 20 MG PO TABS: 20.0000 mg | ORAL_TABLET | Freq: Every day | ORAL | 3 refills | Status: DC

## 2017-11-13 NOTE — Patient Instructions (Signed)
Medication Instructions:  Your physician has recommended you make the following change in your medication:  1- DECREASE Furosemide to 20 mg by mouth once a day.   Labwork: Your physician recommends that you return for lab work in: [place] (BMET).   Testing/Procedures: NONE  Follow-Up: Your physician recommends that you schedule a follow-up appointment in: [place] APP.  If you need a refill on your cardiac medications before your next appointment, please call your pharmacy.

## 2017-11-14 ENCOUNTER — Other Ambulatory Visit: Payer: Self-pay | Admitting: *Deleted

## 2017-11-14 DIAGNOSIS — I5031 Acute diastolic (congestive) heart failure: Secondary | ICD-10-CM

## 2017-11-14 DIAGNOSIS — E876 Hypokalemia: Secondary | ICD-10-CM

## 2017-11-14 LAB — BASIC METABOLIC PANEL
BUN / CREAT RATIO: 8 — AB (ref 12–28)
BUN: 9 mg/dL (ref 8–27)
CO2: 23 mmol/L (ref 20–29)
Calcium: 9.4 mg/dL (ref 8.7–10.3)
Chloride: 92 mmol/L — ABNORMAL LOW (ref 96–106)
Creatinine, Ser: 1.15 mg/dL — ABNORMAL HIGH (ref 0.57–1.00)
GFR, EST AFRICAN AMERICAN: 59 mL/min/{1.73_m2} — AB (ref >59)
GFR, EST NON AFRICAN AMERICAN: 51 mL/min/{1.73_m2} — AB (ref >59)
Glucose: 130 mg/dL — ABNORMAL HIGH (ref 65–99)
POTASSIUM: 3.4 mmol/L — AB (ref 3.5–5.2)
SODIUM: 135 mmol/L (ref 134–144)

## 2017-11-14 MED ORDER — POTASSIUM CHLORIDE CRYS ER 20 MEQ PO TBCR: 20.0000 meq | EXTENDED_RELEASE_TABLET | Freq: Every day | ORAL | 6 refills | Status: DC

## 2017-11-14 NOTE — Progress Notes (Signed)
Patient: Katelyn Cooper Female    DOB: 1955-11-07   62 y.o.   MRN: [id number] Visit Date: 11/15/2017  Today's Provider: Lelon Huh, MD   Chief Complaint  Patient presents with  . Hospitalization Follow-up   Subjective:    HPI   Follow up Hospitalization  Patient was admitted to [facility] on 11/05/2017 and discharged on 11/08/2017. She was treated for Acute Exacerbation of CHF and Acute Diastolic heart failure. Treatment for this included starting patient on IV Lasix  Patient was discharged home on oral Lasix and referred to CHF clinic. She reports good compliance with treatment. She reports this condition is Improved.  Patient also mentions that she is taking potassium supplements as well.   She is also due for diabetes follow up. Has not been checking sugars at home, but did need a few doses of insulin in the hospital. Last a1c in June 2018 was 6.5%      Allergies  Allergen Reactions  . Lovastatin     depression  . Paroxetine Hcl     itching  . Lamotrigine Rash     Current Outpatient Medications:  .  albuterol (PROAIR HFA) 108 (90 Base) MCG/ACT inhaler, Inhale 2 puffs into the lungs every 4 (four) hours as needed for wheezing or shortness of breath., Disp: 1 Inhaler, Rfl: 2 .  aspirin EC 81 MG tablet, Take 1 tablet (81 mg total) by mouth daily., Disp: , Rfl:  .  atorvastatin (LIPITOR) 40 MG tablet, TAKE 1 TABLET BY MOUTH DAILY, Disp: 30 tablet, Rfl: 0 .  Calcium Carb-Cholecalciferol (CALCIUM 1000 + D PO), Take 2 tablets by mouth daily. , Disp: , Rfl:  .  Cholecalciferol (VITAMIN D3) 5000 units TABS, Take 1 tablet by mouth daily. , Disp: , Rfl: 0 .  furosemide (LASIX) 20 MG tablet, Take 1 tablet (20 mg total) by mouth daily., Disp: 90 tablet, Rfl: 3 .  glucose blood test strip, 1 each by Other route as needed for other (ONE TOUCH ULTRA TEST STRIPS AND LANCETS). Use as instructed, Disp: , Rfl:  .  metoprolol tartrate (LOPRESSOR) 25 MG tablet, Take 12.5 mg by mouth  2 (two) times daily., Disp: , Rfl:  .  nicotine (NICODERM CQ - DOSED IN MG/24 HOURS) 21 mg/24hr patch, Place 1 patch (21 mg total) onto the skin daily., Disp: 30 patch, Rfl: 1 .  pantoprazole (PROTONIX) 40 MG tablet, TAKE 1 TABLET BY MOUTH TWICE A DAY BEFORE A MEAL (Patient taking differently: Take 40 mg by mouth daily. ), Disp: 60 tablet, Rfl: 0 .  potassium chloride SA (K-DUR,KLOR-CON) 20 MEQ tablet, Take 1 tablet (20 mEq total) by mouth daily., Disp: 30 tablet, Rfl: 6 .  QUEtiapine (SEROQUEL) 100 MG tablet, Take 1 tablet (100 mg total) by mouth at bedtime. Pt has supply, Disp: 90 tablet, Rfl: 1 .  ranitidine (ZANTAC) 150 MG tablet, Take 150 mg by mouth 2 (two) times daily., Disp: , Rfl:  .  venlafaxine XR (EFFEXOR-XR) 75 MG 24 hr capsule, Take 1 capsule (75 mg total) by mouth daily with breakfast., Disp: 90 capsule, Rfl: 1 .  zaleplon (SONATA) 10 MG capsule, Take 1 capsule (10 mg total) by mouth at bedtime as needed for sleep., Disp: 30 capsule, Rfl: 2  Review of Systems  Constitutional: Negative for appetite change, chills, fatigue and fever.  Respiratory: Negative for chest tightness and shortness of breath.   Cardiovascular: Negative for chest pain and palpitations.  Gastrointestinal: Negative for abdominal  pain, nausea and vomiting.  Neurological: Negative for dizziness and weakness.    Social History   Tobacco Use  . Smoking status: Former Smoker    Packs/day: 1.00    Years: 40.00    Pack years: 40.00    Types: Cigarettes    Start date: 03/01/1975    Last attempt to quit: 08/11/2017    Years since quitting: 0.2  . Smokeless tobacco: Never Used  Substance Use Topics  . Alcohol use: No    Alcohol/week: 0.0 standard drinks   Objective:   BP (!) 122/58 (BP Location: Left Arm, Patient Position: Sitting, Cuff Size: Normal)   Pulse 92   Temp 98.3 F (36.8 C)   Resp 16   Ht 5\' 4"  (1.626 m)   Wt 152 lb (68.9 kg)   SpO2 96%   BMI 26.09 kg/m  Vitals:   11/15/17 1615  BP: (!)  122/58  Pulse: 92  Resp: 16  Temp: 98.3 F (36.8 C)  SpO2: 96%  Weight: 152 lb (68.9 kg)  Height: 5\' 4"  (1.626 m)     Physical Exam   General Appearance:    Alert, cooperative, no distress  Eyes:    PERRL, conjunctiva/corneas clear, EOM's intact       Lungs:     Clear to auscultation bilaterally, respirations unlabored  Heart:    Regular rate and rhythm  Neurologic:   Awake, alert, oriented x 3. No apparent focal neurological           defect.       Results for orders placed or performed in visit on 11/15/17  POCT glycosylated hemoglobin (Hb A1C)  Result Value Ref Range   Hemoglobin A1C 6.8 (A) 4.0 - 5.6 %   HbA1c POC (<> result, manual entry)     HbA1c, POC (prediabetic range)     HbA1c, POC (controlled diabetic range)         Assessment & Plan:     1. Controlled type 2 diabetes mellitus without complication, without long-term current use of insulin ([place]) She is candidate for Jardiance, but she really doesn't want to take any more medications right now and is working on getting on heart healthy diet and increasing exercise. Will hold off on adding any medications for the time being as her A1c is under 7 - POCT glycosylated hemoglobin (Hb A1C)  2. Chronic diastolic heart failure (HCC) Recent diagnosis. Greatly improved with diuresis. Continue follow up at [facility] clinic and cardiology  3. Coronary artery disease involving native coronary artery of native heart without angina pectoris Asymptomatic. Compliant with medication.  Continue aggressive risk factor modification.         Lelon Huh, MD  [facility]

## 2017-11-15 ENCOUNTER — Ambulatory Visit (INDEPENDENT_AMBULATORY_CARE_PROVIDER_SITE_OTHER): Payer: Managed Care, Other (non HMO) | Admitting: Family Medicine

## 2017-11-15 ENCOUNTER — Ambulatory Visit: Payer: Managed Care, Other (non HMO) | Attending: Family | Admitting: Family

## 2017-11-15 ENCOUNTER — Encounter: Payer: Self-pay | Admitting: Family Medicine

## 2017-11-15 ENCOUNTER — Encounter: Payer: Self-pay | Admitting: Family

## 2017-11-15 VITALS — BP 122/58 | HR 92 | Temp 98.3°F | Resp 16 | Ht 64.0 in | Wt 152.0 lb

## 2017-11-15 DIAGNOSIS — Z818 Family history of other mental and behavioral disorders: Secondary | ICD-10-CM | POA: Diagnosis not present

## 2017-11-15 DIAGNOSIS — Z8249 Family history of ischemic heart disease and other diseases of the circulatory system: Secondary | ICD-10-CM | POA: Insufficient documentation

## 2017-11-15 DIAGNOSIS — Z825 Family history of asthma and other chronic lower respiratory diseases: Secondary | ICD-10-CM | POA: Diagnosis not present

## 2017-11-15 DIAGNOSIS — Z87891 Personal history of nicotine dependence: Secondary | ICD-10-CM | POA: Diagnosis not present

## 2017-11-15 DIAGNOSIS — I2510 Atherosclerotic heart disease of native coronary artery without angina pectoris: Secondary | ICD-10-CM | POA: Diagnosis not present

## 2017-11-15 DIAGNOSIS — Z7982 Long term (current) use of aspirin: Secondary | ICD-10-CM | POA: Diagnosis not present

## 2017-11-15 DIAGNOSIS — Z9889 Other specified postprocedural states: Secondary | ICD-10-CM | POA: Diagnosis not present

## 2017-11-15 DIAGNOSIS — I110 Hypertensive heart disease with heart failure: Secondary | ICD-10-CM | POA: Diagnosis present

## 2017-11-15 DIAGNOSIS — F329 Major depressive disorder, single episode, unspecified: Secondary | ICD-10-CM | POA: Diagnosis not present

## 2017-11-15 DIAGNOSIS — K219 Gastro-esophageal reflux disease without esophagitis: Secondary | ICD-10-CM | POA: Diagnosis not present

## 2017-11-15 DIAGNOSIS — Z803 Family history of malignant neoplasm of breast: Secondary | ICD-10-CM | POA: Diagnosis not present

## 2017-11-15 DIAGNOSIS — I10 Essential (primary) hypertension: Secondary | ICD-10-CM | POA: Insufficient documentation

## 2017-11-15 DIAGNOSIS — R42 Dizziness and giddiness: Secondary | ICD-10-CM | POA: Diagnosis not present

## 2017-11-15 DIAGNOSIS — Z811 Family history of alcohol abuse and dependence: Secondary | ICD-10-CM | POA: Insufficient documentation

## 2017-11-15 DIAGNOSIS — Z79899 Other long term (current) drug therapy: Secondary | ICD-10-CM | POA: Insufficient documentation

## 2017-11-15 DIAGNOSIS — Z823 Family history of stroke: Secondary | ICD-10-CM | POA: Insufficient documentation

## 2017-11-15 DIAGNOSIS — E119 Type 2 diabetes mellitus without complications: Secondary | ICD-10-CM

## 2017-11-15 DIAGNOSIS — Z85828 Personal history of other malignant neoplasm of skin: Secondary | ICD-10-CM | POA: Diagnosis not present

## 2017-11-15 DIAGNOSIS — I5032 Chronic diastolic (congestive) heart failure: Secondary | ICD-10-CM | POA: Diagnosis not present

## 2017-11-15 DIAGNOSIS — Z888 Allergy status to other drugs, medicaments and biological substances status: Secondary | ICD-10-CM | POA: Diagnosis not present

## 2017-11-15 DIAGNOSIS — F419 Anxiety disorder, unspecified: Secondary | ICD-10-CM | POA: Insufficient documentation

## 2017-11-15 DIAGNOSIS — G4733 Obstructive sleep apnea (adult) (pediatric): Secondary | ICD-10-CM | POA: Diagnosis not present

## 2017-11-15 DIAGNOSIS — Z801 Family history of malignant neoplasm of trachea, bronchus and lung: Secondary | ICD-10-CM | POA: Diagnosis not present

## 2017-11-15 DIAGNOSIS — K449 Diaphragmatic hernia without obstruction or gangrene: Secondary | ICD-10-CM | POA: Diagnosis not present

## 2017-11-15 DIAGNOSIS — E785 Hyperlipidemia, unspecified: Secondary | ICD-10-CM | POA: Insufficient documentation

## 2017-11-15 LAB — POCT GLYCOSYLATED HEMOGLOBIN (HGB A1C): HEMOGLOBIN A1C: 6.8 % — AB (ref 4.0–5.6)

## 2017-11-15 NOTE — Patient Instructions (Signed)
Hgba1c = 6.8%

## 2017-11-15 NOTE — Progress Notes (Signed)
Patient ID: Katelyn Cooper, female    DOB: [birth year], 62 y.o.   MRN: [id number]  HPI  Katelyn Cooper is a 62 y/o female with a history of DM, hyperlipidemia, HTN, depression, anxiety, GERD, obstructive sleep apnea, previous tobacco use and heart failure.   Echo report from 11/06/17 reviewed and showed an EF of 50-55% along with mild MR and a mildly elevated PA pressure of 35 mm Hg.   Cardiac catheterization done 11/08/17 showed an EF of 55% and mild nonobstructive CAD. Right heart cath showed normal filling pressures, normal pulmonary pressure and moderately reduced cardiac output.   Admitted 11/05/17 due to acute diastolic HF exacerbation. Initially needed IV lasix with transition to oral diuretics. Cardiology consult obtained and catheterization was done. Discharged after 3 days.   She presents today for her initial visit with a chief complaint of moderate fatigue upon minimal exertion. She says that this has been present for several months but is improving. She has associated shortness of breath and light-headedness along with this. She denies any difficulty sleeping, abdominal distention, palpitations, pedal edema, chest pain or weight gain.   Past Medical History:  Diagnosis Date  . Anxiety   . CHF (congestive heart failure) ([place])   . Depression   . Diabetes type 2, controlled ([place])    diet-controlled  . Esophageal stricture   . GERD (gastroesophageal reflux disease)   . Hiatal hernia   . History of chicken pox   . Hyperlipidemia   . Hypertension   . Internal hemorrhoids   . Obstructive sleep apnea   . Panic disorder    Past Surgical History:  Procedure Laterality Date  . [place]   stent placement; ? left iliac artery intervention  . APPENDECTOMY  1998  . BASAL CELL CARCINOMA EXCISION    . CARDIAC CATHETERIZATION N/A 01/12/2016   Procedure: Left Heart Cath and Coronary Angiography;  Surgeon: Nelva Bush, MD;  Location: [facility];  Service: Cardiovascular;   Laterality: N/A;  . FOOT SURGERY     Right  . pap smear  03/2010   Done by [name], normal  . REFRACTIVE SURGERY     right  . RIB RESECTION  [id number]  . RIGHT/LEFT HEART CATH AND CORONARY ANGIOGRAPHY N/A 11/08/2017   Procedure: RIGHT/LEFT HEART CATH AND CORONARY ANGIOGRAPHY;  Surgeon: Wellington Hampshire, MD;  Location: [place] [facility];  Service: Cardiovascular;  Laterality: N/A;  . SHOULDER SURGERY  2005   left  . UPPER GI ENDOSCOPY  09/19/2011   Stricture at GE junction, dilated. Mild gastritis and duodenitis. Small hiatal hernia   Family History  Problem Relation Age of Onset  . Lung cancer Mother        was a smoker  . Emphysema Mother   . Cancer Mother        Lung  . Alcohol abuse Mother   . Depression Mother   . Heart disease Mother   . Drug abuse Sister   . Breast cancer Sister   . Emphysema Sister   . Bipolar disorder Sister   . Alcohol abuse Other   . Depression Other   . Bipolar disorder Other   . Heart disease Other   . Vision loss Other   . Alcohol abuse Father   . Mood Disorder Father   . Heart disease Maternal Grandmother   . Stroke Maternal Grandmother   . Esophageal cancer Maternal Aunt   . Colon cancer Neg Hx  Social History   Tobacco Use  . Smoking status: Former Smoker    Packs/day: 1.00    Years: 40.00    Pack years: 40.00    Types: Cigarettes    Start date: 03/01/1975    Last attempt to quit: 08/11/2017    Years since quitting: 0.2  . Smokeless tobacco: Never Used  Substance Use Topics  . Alcohol use: No    Alcohol/week: 0.0 standard drinks   Allergies  Allergen Reactions  . Lovastatin     depression  . Paroxetine Hcl     itching  . Lamotrigine Rash   Prior to Admission medications   Medication Sig Start Date End Date Taking? Authorizing Provider  albuterol (PROAIR HFA) 108 (90 Base) MCG/ACT inhaler Inhale 2 puffs into the lungs every 4 (four) hours as needed for wheezing or shortness of breath. 06/20/17  Yes Flora Lipps, MD  aspirin EC 81 MG tablet Take 1 tablet (81 mg total) by mouth daily. 12/12/15  Yes End, Harrell Gave, MD  atorvastatin (LIPITOR) 40 MG tablet TAKE 1 TABLET BY MOUTH DAILY 10/23/17  Yes End, Harrell Gave, MD  Calcium Carb-Cholecalciferol (CALCIUM 1000 + D PO) Take 2 tablets by mouth daily.    Yes [provider]  Cholecalciferol (VITAMIN D3) 5000 units TABS Take 1 tablet by mouth daily.  04/09/16  Yes [provider]  furosemide (LASIX) 20 MG tablet Take 1 tablet (20 mg total) by mouth daily. 11/13/17 02/11/18 Yes End, Harrell Gave, MD  glucose blood test strip 1 each by Other route as needed for other (ONE TOUCH ULTRA TEST STRIPS AND LANCETS). Use as instructed   Yes [provider]  metoprolol tartrate (LOPRESSOR) 25 MG tablet Take 12.5 mg by mouth 2 (two) times daily.   Yes [provider]  nicotine (NICODERM CQ - DOSED IN MG/24 HOURS) 21 mg/24hr patch Place 1 patch (21 mg total) onto the skin daily. 06/21/17 06/21/18 Yes Kasa, Maretta Bees, MD  pantoprazole (PROTONIX) 40 MG tablet TAKE 1 TABLET BY MOUTH TWICE A DAY BEFORE A MEAL Patient taking differently: Take 40 mg by mouth daily.  08/29/17  Yes Ladene Artist, MD  potassium chloride SA (K-DUR,KLOR-CON) 20 MEQ tablet Take 1 tablet (20 mEq total) by mouth daily. 11/14/17  Yes End, Harrell Gave, MD  QUEtiapine (SEROQUEL) 100 MG tablet Take 1 tablet (100 mg total) by mouth at bedtime. Pt has supply 09/02/17  Yes Rainey Pines, MD  ranitidine (ZANTAC) 150 MG tablet Take 150 mg by mouth 2 (two) times daily.   Yes [provider]  venlafaxine XR (EFFEXOR-XR) 75 MG 24 hr capsule Take 1 capsule (75 mg total) by mouth daily with breakfast. 06/03/17  Yes Rainey Pines, MD  zaleplon (SONATA) 10 MG capsule Take 1 capsule (10 mg total) by mouth at bedtime as needed for sleep. 09/02/17  Yes Rainey Pines, MD    Review of Systems  Constitutional: Positive for appetite change (decreased) and fatigue (getting better).  HENT:  Negative for congestion, postnasal drip and sore throat.   Eyes: Negative.   Respiratory: Positive for shortness of breath (improving). Negative for chest tightness.   Cardiovascular: Negative for chest pain and leg swelling.  Gastrointestinal: Negative for abdominal distention and abdominal pain.  Endocrine: Negative.   Genitourinary: Negative.   Musculoskeletal: Negative for back pain and neck pain.  Skin: Negative.   Allergic/Immunologic: Negative.   Neurological: Positive for light-headedness (with position changes). Negative for dizziness.  Hematological: Negative for adenopathy. Does not bruise/bleed easily.  Psychiatric/Behavioral: Negative for dysphoric mood and sleep disturbance (wearing CPAP with 1 pillow). The patient is not nervous/anxious.    Vitals:   11/15/17 1259  BP: 105/63  Pulse: 97  Resp: 18  SpO2: 97%  Weight: 151 lb 8 oz (68.7 kg)  Height: 5\' 3"  (1.6 m)   Wt Readings from Last 3 Encounters:  11/15/17 151 lb 8 oz (68.7 kg)  11/13/17 151 lb 8 oz (68.7 kg)  11/08/17 153 lb (69.4 kg)   Lab Results  Component Value Date   CREATININE 1.15 (H) 11/13/2017   CREATININE 1.12 (H) 11/08/2017   CREATININE 1.01 (H) 11/07/2017    Physical Exam  Constitutional: She is oriented to person, place, and time. She appears well-developed and well-nourished.  HENT:  Head: Normocephalic and atraumatic.  Neck: Normal range of motion. Neck supple. No JVD present.  Cardiovascular: Normal rate and regular rhythm.  Pulmonary/Chest: Effort normal. No respiratory distress. She has no wheezes. She has no rales.  Abdominal: Soft. She exhibits no distension.  Musculoskeletal:       Right lower leg: She exhibits no tenderness and no edema.       Left lower leg: She exhibits no tenderness and no edema.  Neurological: She is alert and oriented to person, place, and time.  Skin: Skin is warm and dry.  Psychiatric: She has a normal mood and affect. Her behavior is normal.  Nursing note  and vitals reviewed.  Assessment & Plan:  1: Chronic heart failure with preserved ejection fraction- - NYHA class III - euvolemic today - already weighing daily and she was instructed to call for an overnight weight gain of >2 pounds or a weekly weight gain of >5 pounds - reading food labels and not adding any salt to her food. Reviewed the importance of closely following a 2000mg  sodium diet and written dietary information was given to her about this - is going [place] walking at the mall and she was instructed to sit down if she starts to feel fatigued or short of breath. Discussed pulmonary rehab with her but she will have to be 6 weeks out from her HF admission - may need to titrate up metoprolol if she remains tachycardic - saw cardiology (End) 11/13/17 - BNP 11/05/17 was 731.0  2: HTN- - BP looks good today although on the low side - saw PCP Caryn Section) 04/16/17 and returns later today - BMP 11/13/17 reviewed and showed sodium 135, potassium 3.4 and GFR 51  3: Obstructive sleep apnea- - saw pulmonologist (Kasa) 06/13/17 - wearing CPAP nightly.  Medication list was reviewed.  Return in 2 months or sooner for any questions/problems before then.

## 2017-11-15 NOTE — Patient Instructions (Signed)
Continue weighing daily and call for an overnight weight gain of > 2 pounds or a weekly weight gain of >5 pounds. 

## 2017-11-18 ENCOUNTER — Other Ambulatory Visit: Payer: Self-pay | Admitting: Family Medicine

## 2017-11-18 MED ORDER — GLUCOSE BLOOD VI STRP: ORAL_STRIP | 3 refills | Status: DC

## 2017-11-18 NOTE — Telephone Encounter (Signed)
Patient needs refills on One Touch Ultra Mini strips sent to [facility] on [facility]

## 2017-11-26 ENCOUNTER — Other Ambulatory Visit: Payer: Self-pay

## 2017-11-26 MED ORDER — ATORVASTATIN CALCIUM 40 MG PO TABS: 40.0000 mg | ORAL_TABLET | Freq: Every day | ORAL | 3 refills | Status: DC

## 2017-11-29 ENCOUNTER — Other Ambulatory Visit: Payer: Self-pay | Admitting: *Deleted

## 2017-11-29 ENCOUNTER — Other Ambulatory Visit
Admission: RE | Admit: 2017-11-29 | Discharge: 2017-11-29 | Disposition: A | Discharge status: Home / Self Care | Payer: Managed Care, Other (non HMO) | Source: Ambulatory Visit | Attending: Internal Medicine | Admitting: Internal Medicine

## 2017-11-29 DIAGNOSIS — E876 Hypokalemia: Secondary | ICD-10-CM

## 2017-11-29 DIAGNOSIS — I5031 Acute diastolic (congestive) heart failure: Secondary | ICD-10-CM | POA: Insufficient documentation

## 2017-11-29 LAB — BASIC METABOLIC PANEL
ANION GAP: 8 (ref 5–15)
BUN: 6 mg/dL — ABNORMAL LOW (ref 8–23)
CALCIUM: 8.7 mg/dL — AB (ref 8.9–10.3)
CHLORIDE: 103 mmol/L (ref 98–111)
CO2: 26 mmol/L (ref 22–32)
Creatinine, Ser: 0.97 mg/dL (ref 0.44–1.00)
GFR calc non Af Amer: >60 mL/min (ref >60)
GLUCOSE: 198 mg/dL — AB (ref 70–99)
POTASSIUM: 3 mmol/L — AB (ref 3.5–5.1)
Sodium: 137 mmol/L (ref 135–145)

## 2017-11-29 MED ORDER — POTASSIUM CHLORIDE CRYS ER 20 MEQ PO TBCR: 40.0000 meq | EXTENDED_RELEASE_TABLET | Freq: Two times a day (BID) | ORAL | 6 refills | Status: DC

## 2017-12-05 ENCOUNTER — Other Ambulatory Visit
Admission: RE | Admit: 2017-12-05 | Discharge: 2017-12-05 | Disposition: A | Discharge status: Home / Self Care | Payer: Managed Care, Other (non HMO) | Source: Ambulatory Visit | Attending: Nurse Practitioner | Admitting: Nurse Practitioner

## 2017-12-05 ENCOUNTER — Other Ambulatory Visit: Payer: Self-pay | Admitting: Internal Medicine

## 2017-12-05 DIAGNOSIS — E876 Hypokalemia: Secondary | ICD-10-CM

## 2017-12-05 LAB — BASIC METABOLIC PANEL
Anion gap: 7 (ref 5–15)
BUN: 8 mg/dL (ref 8–23)
CHLORIDE: 107 mmol/L (ref 98–111)
CO2: 25 mmol/L (ref 22–32)
CREATININE: 0.98 mg/dL (ref 0.44–1.00)
Calcium: 8.9 mg/dL (ref 8.9–10.3)
GFR calc Af Amer: >60 mL/min (ref >60)
GFR calc non Af Amer: >60 mL/min (ref >60)
Glucose, Bld: 169 mg/dL — ABNORMAL HIGH (ref 70–99)
Potassium: 3.9 mmol/L (ref 3.5–5.1)
Sodium: 139 mmol/L (ref 135–145)

## 2017-12-09 ENCOUNTER — Other Ambulatory Visit: Payer: Self-pay

## 2017-12-09 ENCOUNTER — Ambulatory Visit (INDEPENDENT_AMBULATORY_CARE_PROVIDER_SITE_OTHER): Payer: 59 | Admitting: Psychiatry

## 2017-12-09 ENCOUNTER — Encounter: Payer: Self-pay | Admitting: Psychiatry

## 2017-12-09 VITALS — BP 132/73 | HR 106 | Temp 98.1°F | Wt 154.4 lb

## 2017-12-09 DIAGNOSIS — F4310 Post-traumatic stress disorder, unspecified: Secondary | ICD-10-CM | POA: Diagnosis not present

## 2017-12-09 DIAGNOSIS — F3160 Bipolar disorder, current episode mixed, unspecified: Secondary | ICD-10-CM | POA: Diagnosis not present

## 2017-12-09 MED ORDER — QUETIAPINE FUMARATE 100 MG PO TABS: 100.0000 mg | ORAL_TABLET | Freq: Every day | ORAL | 1 refills | Status: DC

## 2017-12-09 MED ORDER — VENLAFAXINE HCL ER 75 MG PO CP24: 75.0000 mg | ORAL_CAPSULE | Freq: Every day | ORAL | 1 refills | Status: DC

## 2017-12-09 MED ORDER — ZALEPLON 10 MG PO CAPS: 10.0000 mg | ORAL_CAPSULE | Freq: Every evening | ORAL | 2 refills | Status: DC | PRN
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
## 2017-12-18 ENCOUNTER — Encounter: Payer: Self-pay | Admitting: Nurse Practitioner

## 2017-12-18 ENCOUNTER — Ambulatory Visit (INDEPENDENT_AMBULATORY_CARE_PROVIDER_SITE_OTHER): Payer: Managed Care, Other (non HMO) | Admitting: Nurse Practitioner

## 2017-12-18 VITALS — BP 100/65 | HR 96 | Ht 63.0 in | Wt 153.5 lb

## 2017-12-18 DIAGNOSIS — E782 Mixed hyperlipidemia: Secondary | ICD-10-CM | POA: Diagnosis not present

## 2017-12-18 DIAGNOSIS — I5032 Chronic diastolic (congestive) heart failure: Secondary | ICD-10-CM

## 2017-12-18 NOTE — Progress Notes (Signed)
Office Visit    Patient Name: Katelyn Cooper Date of Encounter: 12/18/2017  Primary Care Provider:  [name], MD Primary Cardiologist:  Nelva Bush, MD  Chief Complaint    62 year old female with a history of hypertension, hyperlipidemia, nonobstructive CAD, sleep apnea, peripheral arterial disease, diet-controlled diabetes, GERD, anxiety, and depression, who presents for follow-up related to HFpEF.  Past Medical History    Past Medical History:  Diagnosis Date  . (HFpEF) heart failure with preserved ejection fraction ([place])    a. 12/2015 Echo: EF 55-60%, nl RV size/fxn, no significant valvular abnormalities; b. 10/2017 Echo: EF 50-55%, antsept, ant HK. Gr2 DD. Mild MR. Nl RV fxn. PASP 76mmHg.  [name] Anxiety   . Depression   . Diabetes type 2, controlled ([place])    diet-controlled  . Esophageal stricture   . GERD (gastroesophageal reflux disease)   . Hiatal hernia   . History of chicken pox   . Hyperlipidemia   . Hypertension   . Internal hemorrhoids   . Non-obstructive CAD (coronary artery disease)    a. 12/2015 MV: apical ant and apical reversible defect. EF 55-65%; b. 01/2016 Cath: LM nl, LAD nl, SP1 40ost, LCX nl, OM1 30, RCA 30p/m, EDP 15-7mmHg; c. 10/2017 Cath: LM nl, LAD nl, SP1 30ost, LCX nl, OM1 30, RCA 30p/m. EF 55%.  . Obstructive sleep apnea   . PAD (peripheral artery disease) ([place])    a. 1990's s/p prior LCIA stenting; b. 12/2015 ABI: R 1.05, L 1.03.  . Panic disorder    Past Surgical History:  Procedure Laterality Date  . [place]   stent placement; ? left iliac artery intervention  . APPENDECTOMY  1998  . BASAL CELL CARCINOMA EXCISION    . CARDIAC CATHETERIZATION N/A 01/12/2016   Procedure: Left Heart Cath and Coronary Angiography;  Surgeon: Nelva Bush, MD;  Location: [place] [facility];  Service: Cardiovascular;  Laterality: N/A;  . FOOT SURGERY     Right  . pap smear  03/2010   Done by [name], normal  . REFRACTIVE SURGERY      right  . RIB RESECTION  [id number]  . RIGHT/LEFT HEART CATH AND CORONARY ANGIOGRAPHY N/A 11/08/2017   Procedure: RIGHT/LEFT HEART CATH AND CORONARY ANGIOGRAPHY;  Surgeon: Wellington Hampshire, MD;  Location: [place] [facility];  Service: Cardiovascular;  Laterality: N/A;  . SHOULDER SURGERY  2005   left  . UPPER GI ENDOSCOPY  09/19/2011   Stricture at GE junction, dilated. Mild gastritis and duodenitis. Small hiatal hernia    Allergies  Allergies  Allergen Reactions  . Lovastatin     depression  . Paroxetine Hcl     itching  . Lamotrigine Rash    History of Present Illness    62 year old female with the above complex past medical history including hypertension, hyperlipidemia, nonobstructive CAD, sleep apnea, peripheral arterial disease, GERD, anxiety, diet-controlled diabetes, depression, and HFpEF.  She was hospitalized at [facility] in early August with progressive dyspnea and hypoxia.  Echo showed an EF of 50 to 55% with new anteroseptal and anterior hypokinesis.  She was also noted to have a new IVCD.  Diagnostic catheterization was performed and showed stable, nonobstructive CAD and medical therapy was recommended.  She was last seen in clinic in mid August, at which time she was doing reasonably well.  Her Lasix dose was reduced to 20 mg daily at that time.  Follow-up labs subsequently showed hypokalemia and she required  additional potassium supplementation.    Since her last visit, she reports doing remarkably well.  She is walking at the mall for about 30 minutes 3-4 times per week.  She is not experiencing any chest pain, dyspnea, palpitations, PND, orthopnea, syncope, edema, or early satiety.  She does occasionally notice lightheadedness if she stands up too fast.  Her weights have been stable between 150 and 153 pounds.  She has not required any supplemental Lasix on top of her daily dose.  She is very careful with her salt intake and tries to avoid eating out.  Overall  she is quite pleased with how she has been feeling.  Home Medications    Prior to Admission medications   Medication Sig Start Date End Date Taking? Authorizing Provider  albuterol (PROAIR HFA) 108 (90 Base) MCG/ACT inhaler Inhale 2 puffs into the lungs every 4 (four) hours as needed for wheezing or shortness of breath. 06/20/17  Yes Flora Lipps, MD  aspirin EC 81 MG tablet Take 1 tablet (81 mg total) by mouth daily. 12/12/15  Yes End, Harrell Gave, MD  atorvastatin (LIPITOR) 40 MG tablet Take 1 tablet (40 mg total) by mouth daily. 11/26/17  Yes End, Harrell Gave, MD  Calcium Carb-Cholecalciferol (CALCIUM 1000 + D PO) Take 2 tablets by mouth daily.    Yes [provider]  Cholecalciferol (VITAMIN D3) 5000 units TABS Take 1 tablet by mouth daily.  04/09/16  Yes [provider]  furosemide (LASIX) 20 MG tablet Take 1 tablet (20 mg total) by mouth daily. 11/13/17 02/11/18 Yes End, Harrell Gave, MD  glucose blood test strip ONETOUCH ULTRA MINI STRIPS AND LANCETS. Use one daily to check blood sugar. 11/18/17  Yes [name], MD  metoprolol tartrate (LOPRESSOR) 25 MG tablet Take 12.5 mg by mouth 2 (two) times daily.   Yes [provider]  pantoprazole (PROTONIX) 40 MG tablet TAKE 1 TABLET BY MOUTH TWICE A DAY BEFORE A MEAL Patient taking differently: Take 40 mg by mouth daily.  08/29/17  Yes Ladene Artist, MD  potassium chloride SA (K-DUR,KLOR-CON) 20 MEQ tablet Take 2 tablets (40 mEq total) by mouth 2 (two) times daily. 11/29/17  Yes Theora Gianotti, NP  QUEtiapine (SEROQUEL) 100 MG tablet Take 1 tablet (100 mg total) by mouth at bedtime. 12/09/17  Yes Rainey Pines, MD  ranitidine (ZANTAC) 150 MG tablet Take 150 mg by mouth 2 (two) times daily.   Yes [provider]  venlafaxine XR (EFFEXOR-XR) 75 MG 24 hr capsule Take 1 capsule (75 mg total) by mouth daily with breakfast. 12/09/17  Yes Rainey Pines, MD  zaleplon (SONATA) 10 MG capsule Take 1 capsule (10 mg  total) by mouth at bedtime as needed for sleep. 12/09/17  Yes Rainey Pines, MD    Review of Systems    She denies chest pain, palpitations, dyspnea, pnd, orthopnea, n, v, syncope, edema, weight gain, or early satiety.  She does occasionally notice lightheadedness if she stands up too fast.  All other systems reviewed and are otherwise negative except as noted above.  Physical Exam    VS:  BP 100/65 (BP Location: Left Arm, Patient Position: Sitting, Cuff Size: Normal)   Pulse 96   Ht 5\' 3"  (1.6 m)   Wt 153 lb 8 oz (69.6 kg)   BMI 27.19 kg/m  , BMI Body mass index is 27.19 kg/m. GEN: Well nourished, well developed, in no acute distress. HEENT: normal. Neck: Supple, no JVD, carotid bruits, or masses. Cardiac: RRR,  no murmurs, rubs, or gallops. No clubbing, cyanosis, edema.  Radials/DP/PT 2+ and equal bilaterally.  Respiratory:  Respirations regular and unlabored, clear to auscultation bilaterally. GI: Soft, nontender, nondistended, BS + x 4. MS: no deformity or atrophy. Skin: warm and dry, no rash. Neuro:  Strength and sensation are intact. Psych: Normal affect.  Accessory Clinical Findings    ECG personally reviewed by me today -regular sinus rhythm, 96, left bundle branch block- no acute changes.  Assessment & Plan    1.  HFpEF: Patient hospitalized in August with acute on chronic diastolic heart failure.  Echo showed EF of 50 to 55%.  Diagnostic catheterization was performed secondary to newly developed IVCD and also anterior hypokinesis on echo.  Cath showed stable nonobstructive CAD.  She has been medically managed and has done well since her last visit in August.  She is now walking a few times per week and trying to increase her exercise tolerance.  She is not experiencing any significant dyspnea and her weight has been stable.  She is euvolemic on exam today.  Blood pressure is soft and she is only on metoprolol 12.5 twice daily.  She occasionally notes lightheadedness when  standing up quickly but overall feels that this is tolerable.  I will continue beta-blocker at current dose.  Continue Lasix 20 mg daily.  She had follow-up labs earlier this month which showed stable renal function and potassium.  We discussed the importance of daily weights, sodium restriction, medication compliance, and symptom reporting and she verbalizes understanding.  2.  Nonobstructive CAD: Status post catheterization in early August.  She has not been having any chest pain.  She remains on aspirin and statin therapy.  3.  Hyperlipidemia: LDL was 73 in June.  LFTs were normal in August.  4.  Disposition: Follow-up in 3 months or sooner if necessary.  Murray Hodgkins, NP 12/18/2017, 12:17 PM

## 2017-12-18 NOTE — Patient Instructions (Signed)
Medication Instructions: Your physician recommends that you continue on your current medications as directed. Please refer to the Current Medication list given to you today.  If you need a refill on your cardiac medications before your next appointment, please call your pharmacy.    Follow-Up: Your physician wants you to follow-up in 3 months with Dr. Saunders Revel.   Thank you for choosing [facility]!

## 2017-12-30 NOTE — Progress Notes (Signed)
Patient ID: Katelyn Cooper, female    DOB: [birth year], 62 y.o.   MRN: [id number]  HPI  Ms Asfour is a 62 y/o female with a history of DM, hyperlipidemia, HTN, depression, anxiety, GERD, obstructive sleep apnea, previous tobacco use and heart failure.   Echo report from 11/06/17 reviewed and showed an EF of 50-55% along with mild MR and a mildly elevated PA pressure of 35 mm Hg.   Cardiac catheterization done 11/08/17 showed an EF of 55% and mild nonobstructive CAD. Right heart cath showed normal filling pressures, normal pulmonary pressure and moderately reduced cardiac output.   Admitted 11/05/17 due to acute diastolic HF exacerbation. Initially needed IV lasix with transition to oral diuretics. Cardiology consult obtained and catheterization was done. Discharged after 3 days.   She presents today for a follow-up visit with a chief complaint of minimal shortness of breath upon moderate exertion. She says that this has continued to improve and that, overall, she feels much better than she did. She has associated fatigue and neck pain along with this. She denies difficulty sleeping, abdominal distention, pedal edema, chest pain, palpitations, cough, dizziness or weight gain. Is walking 3 days/ week for ~ 30 minutes each time. Has been really trying to watch her sodium intake. Has not gotten her flu vaccine yet.   Past Medical History:  Diagnosis Date  . (HFpEF) heart failure with preserved ejection fraction ([place])    a. 12/2015 Echo: EF 55-60%, nl RV size/fxn, no significant valvular abnormalities; b. 10/2017 Echo: EF 50-55%, antsept, ant HK. Gr2 DD. Mild MR. Nl RV fxn. PASP 62mmHg.  [name] Anxiety   . Depression   . Diabetes type 2, controlled ([place])    diet-controlled  . Esophageal stricture   . GERD (gastroesophageal reflux disease)   . Hiatal hernia   . History of chicken pox   . Hyperlipidemia   . Hypertension   . Internal hemorrhoids   . Non-obstructive CAD (coronary artery disease)    a. 12/2015 MV:  apical ant and apical reversible defect. EF 55-65%; b. 01/2016 Cath: LM nl, LAD nl, SP1 40ost, LCX nl, OM1 30, RCA 30p/m, EDP 15-6mmHg; c. 10/2017 Cath: LM nl, LAD nl, SP1 30ost, LCX nl, OM1 30, RCA 30p/m. EF 55%.  . Obstructive sleep apnea   . PAD (peripheral artery disease) ([place])    a. 1990's s/p prior LCIA stenting; b. 12/2015 ABI: R 1.05, L 1.03.  . Panic disorder    Past Surgical History:  Procedure Laterality Date  . [place]   stent placement; ? left iliac artery intervention  . APPENDECTOMY  1998  . BASAL CELL CARCINOMA EXCISION    . CARDIAC CATHETERIZATION N/A 01/12/2016   Procedure: Left Heart Cath and Coronary Angiography;  Surgeon: Nelva Bush, MD;  Location: [facility];  Service: Cardiovascular;  Laterality: N/A;  . FOOT SURGERY     Right  . pap smear  03/2010   Done by [name], normal  . REFRACTIVE SURGERY     right  . RIB RESECTION  [id number]  . RIGHT/LEFT HEART CATH AND CORONARY ANGIOGRAPHY N/A 11/08/2017   Procedure: RIGHT/LEFT HEART CATH AND CORONARY ANGIOGRAPHY;  Surgeon: Wellington Hampshire, MD;  Location: [facility];  Service: Cardiovascular;  Laterality: N/A;  . SHOULDER SURGERY  2005   left  . UPPER GI ENDOSCOPY  09/19/2011   Stricture at GE junction, dilated. Mild gastritis and duodenitis. Small hiatal hernia   Family History  Problem Relation Age of Onset  . Lung cancer Mother        was a smoker  . Emphysema Mother   . Cancer Mother        Lung  . Alcohol abuse Mother   . Depression Mother   . Heart disease Mother   . Drug abuse Sister   . Breast cancer Sister   . Emphysema Sister   . Bipolar disorder Sister   . Alcohol abuse Other   . Depression Other   . Bipolar disorder Other   . Heart disease Other   . Vision loss Other   . Alcohol abuse Father   . Mood Disorder Father   . Heart disease Maternal Grandmother   . Stroke Maternal Grandmother   . Esophageal cancer Maternal Aunt   . Colon cancer Neg Hx     Social History   Tobacco Use  . Smoking status: Former Smoker    Packs/day: 1.00    Years: 40.00    Pack years: 40.00    Types: Cigarettes    Start date: 03/01/1975    Last attempt to quit: 08/11/2017    Years since quitting: 0.3  . Smokeless tobacco: Never Used  Substance Use Topics  . Alcohol use: No    Alcohol/week: 0.0 standard drinks   Allergies  Allergen Reactions  . Lovastatin     depression  . Paroxetine Hcl     itching  . Lamotrigine Rash   Prior to Admission medications   Medication Sig Start Date End Date Taking? Authorizing Provider  albuterol (PROAIR HFA) 108 (90 Base) MCG/ACT inhaler Inhale 2 puffs into the lungs every 4 (four) hours as needed for wheezing or shortness of breath. 06/20/17  Yes Flora Lipps, MD  aspirin EC 81 MG tablet Take 1 tablet (81 mg total) by mouth daily. 12/12/15  Yes End, Harrell Gave, MD  atorvastatin (LIPITOR) 40 MG tablet Take 1 tablet (40 mg total) by mouth daily. 11/26/17  Yes End, Harrell Gave, MD  Calcium Carb-Cholecalciferol (CALCIUM 1000 + D PO) Take 2 tablets by mouth daily.    Yes [provider]  Cholecalciferol (VITAMIN D3) 5000 units TABS Take 1 tablet by mouth daily.  04/09/16  Yes [provider]  furosemide (LASIX) 20 MG tablet Take 1 tablet (20 mg total) by mouth daily. 11/13/17 02/11/18 Yes End, Harrell Gave, MD  glucose blood test strip ONETOUCH ULTRA MINI STRIPS AND LANCETS. Use one daily to check blood sugar. 11/18/17  Yes [name], MD  lansoprazole (PREVACID) 15 MG capsule Take 15 mg by mouth daily at 6 (six) AM.   Yes [provider]  metoprolol tartrate (LOPRESSOR) 25 MG tablet Take 12.5 mg by mouth 2 (two) times daily.   Yes [provider]  potassium chloride SA (K-DUR,KLOR-CON) 20 MEQ tablet Take 2 tablets (40 mEq total) by mouth 2 (two) times daily. 11/29/17  Yes Theora Gianotti, NP  QUEtiapine (SEROQUEL) 100 MG tablet Take 1 tablet (100 mg total) by mouth at bedtime.  12/09/17  Yes Rainey Pines, MD  venlafaxine XR (EFFEXOR-XR) 75 MG 24 hr capsule Take 1 capsule (75 mg total) by mouth daily with breakfast. 12/09/17  Yes Rainey Pines, MD  zaleplon (SONATA) 10 MG capsule Take 1 capsule (10 mg total) by mouth at bedtime as needed for sleep. Patient taking differently: Take 10 mg by mouth at bedtime as needed for sleep. Takes one a night 12/09/17  Yes Rainey Pines, MD    Review of Systems  Constitutional: Positive for fatigue (getting better). Negative for appetite change.  HENT: Negative for congestion, postnasal drip and sore throat.   Eyes: Negative.   Respiratory: Positive for shortness of breath (improving). Negative for cough and chest tightness.   Cardiovascular: Negative for chest pain, palpitations and leg swelling.  Gastrointestinal: Negative for abdominal distention and abdominal pain.  Endocrine: Negative.   Genitourinary: Negative.   Musculoskeletal: Positive for neck pain. Negative for back pain.  Skin: Negative.   Allergic/Immunologic: Negative.   Neurological: Negative for dizziness and light-headedness.  Hematological: Negative for adenopathy. Does not bruise/bleed easily.  Psychiatric/Behavioral: Negative for dysphoric mood and sleep disturbance (wearing CPAP with 1 pillow). The patient is not nervous/anxious.    Vitals:   12/31/17 0959  BP: 124/62  Pulse: 97  Resp: 18  SpO2: 99%  Weight: 153 lb 8 oz (69.6 kg)  Height: 5\' 3"  (1.6 m)   Wt Readings from Last 3 Encounters:  12/31/17 153 lb 8 oz (69.6 kg)  12/18/17 153 lb 8 oz (69.6 kg)  11/15/17 152 lb (68.9 kg)   Lab Results  Component Value Date   CREATININE 0.98 12/05/2017   CREATININE 0.97 11/29/2017   CREATININE 1.15 (H) 11/13/2017    Physical Exam  Constitutional: She is oriented to person, place, and time. She appears well-developed and well-nourished.  HENT:  Head: Normocephalic and atraumatic.  Neck: Normal range of motion. Neck supple. No JVD present.  Cardiovascular:  Normal rate and regular rhythm.  Pulmonary/Chest: Effort normal. No respiratory distress. She has no wheezes. She has no rales.  Abdominal: Soft. She exhibits no distension.  Musculoskeletal:       Right lower leg: She exhibits no tenderness and no edema.       Left lower leg: She exhibits no tenderness and no edema.  Neurological: She is alert and oriented to person, place, and time.  Skin: Skin is warm and dry.  Psychiatric: She has a normal mood and affect. Her behavior is normal.  Nursing note and vitals reviewed.  Assessment & Plan:  1: Chronic heart failure with preserved ejection fraction- - NYHA class II - euvolemic today - weighing daily and she was reminded to call for an overnight weight gain of >2 pounds or a weekly weight gain of >5 pounds - weight up ~ 2 pounds since she was last here 6 weeks ago - reading food labels and not adding any salt to her food. Reminded to closely follow a 2000mg  sodium diet  - [place] 3 [place]/ [place] for ~ 30 minutes each time - patient is not interested in pulmonary rehab at this time since she's walking at the mall - saw cardiology Sharolyn Douglas) 12/18/17 - BNP 11/05/17 was 731.0 - PharmD reconciled medications with the patient  2: HTN- - BP looks good today; has been low previously - saw PCP Caryn Section) 11/15/17  - BMP 12/05/17 reviewed and showed sodium 139, potassium 3.9, creatinine 0.98 and GFR >60  Patient did not bring her medications nor a list. Each medication was verbally reviewed with the patient and she was encouraged to bring the bottles to every visit to confirm accuracy of list.  Will not make a return appointment for patient at this time. Advised patient that she could call back at anytime to make another appointment. She is to continue care with her cardiologist and PCP.

## 2017-12-31 ENCOUNTER — Ambulatory Visit: Payer: Managed Care, Other (non HMO) | Attending: Family | Admitting: Family

## 2017-12-31 ENCOUNTER — Encounter: Payer: Self-pay | Admitting: Family

## 2017-12-31 VITALS — BP 124/62 | HR 97 | Resp 18 | Ht 63.0 in | Wt 153.5 lb

## 2017-12-31 DIAGNOSIS — Z85828 Personal history of other malignant neoplasm of skin: Secondary | ICD-10-CM | POA: Insufficient documentation

## 2017-12-31 DIAGNOSIS — Z79899 Other long term (current) drug therapy: Secondary | ICD-10-CM | POA: Diagnosis not present

## 2017-12-31 DIAGNOSIS — I110 Hypertensive heart disease with heart failure: Secondary | ICD-10-CM | POA: Insufficient documentation

## 2017-12-31 DIAGNOSIS — E1151 Type 2 diabetes mellitus with diabetic peripheral angiopathy without gangrene: Secondary | ICD-10-CM | POA: Diagnosis not present

## 2017-12-31 DIAGNOSIS — E785 Hyperlipidemia, unspecified: Secondary | ICD-10-CM | POA: Insufficient documentation

## 2017-12-31 DIAGNOSIS — I2510 Atherosclerotic heart disease of native coronary artery without angina pectoris: Secondary | ICD-10-CM | POA: Diagnosis not present

## 2017-12-31 DIAGNOSIS — K449 Diaphragmatic hernia without obstruction or gangrene: Secondary | ICD-10-CM | POA: Diagnosis not present

## 2017-12-31 DIAGNOSIS — Z7982 Long term (current) use of aspirin: Secondary | ICD-10-CM | POA: Diagnosis not present

## 2017-12-31 DIAGNOSIS — I5032 Chronic diastolic (congestive) heart failure: Secondary | ICD-10-CM | POA: Diagnosis not present

## 2017-12-31 DIAGNOSIS — Z87891 Personal history of nicotine dependence: Secondary | ICD-10-CM | POA: Insufficient documentation

## 2017-12-31 DIAGNOSIS — F419 Anxiety disorder, unspecified: Secondary | ICD-10-CM | POA: Diagnosis not present

## 2017-12-31 DIAGNOSIS — I10 Essential (primary) hypertension: Secondary | ICD-10-CM

## 2017-12-31 DIAGNOSIS — G4733 Obstructive sleep apnea (adult) (pediatric): Secondary | ICD-10-CM | POA: Insufficient documentation

## 2017-12-31 DIAGNOSIS — F329 Major depressive disorder, single episode, unspecified: Secondary | ICD-10-CM | POA: Insufficient documentation

## 2017-12-31 DIAGNOSIS — K219 Gastro-esophageal reflux disease without esophagitis: Secondary | ICD-10-CM | POA: Insufficient documentation

## 2017-12-31 NOTE — Patient Instructions (Signed)
Continue weighing daily and call for an overnight weight gain of > 2 pounds or a weekly weight gain of >5 pounds. 

## 2018-01-02 ENCOUNTER — Other Ambulatory Visit: Payer: Self-pay | Admitting: Family Medicine

## 2018-01-02 MED ORDER — METOPROLOL TARTRATE 25 MG PO TABS: 12.5000 mg | ORAL_TABLET | Freq: Two times a day (BID) | ORAL | 4 refills | Status: DC

## 2018-01-02 NOTE — Telephone Encounter (Signed)
[facility] faxed refill request for the following medications:  metoprolol tartrate (LOPRESSOR) 25 MG *Plain*  tablet    Please advise.

## 2018-01-07 ENCOUNTER — Encounter: Payer: Self-pay | Admitting: Internal Medicine

## 2018-01-07 ENCOUNTER — Ambulatory Visit (INDEPENDENT_AMBULATORY_CARE_PROVIDER_SITE_OTHER): Payer: Managed Care, Other (non HMO) | Admitting: Internal Medicine

## 2018-01-07 VITALS — BP 120/64 | HR 76 | Ht 62.5 in | Wt 155.2 lb

## 2018-01-07 DIAGNOSIS — G4733 Obstructive sleep apnea (adult) (pediatric): Secondary | ICD-10-CM

## 2018-01-07 DIAGNOSIS — I5032 Chronic diastolic (congestive) heart failure: Secondary | ICD-10-CM | POA: Diagnosis not present

## 2018-01-07 NOTE — Patient Instructions (Signed)
Continue CPAP as prescribed Follow up with Cardiology, lasix as prescribed   CONGRATULATIONS!!!!! YOU STOPPED SMOKING!!!

## 2018-01-07 NOTE — Progress Notes (Signed)
[facility] Consultation     Date: 01/07/2018,   MRN# [id number] Katelyn Cooper [birth year]     Admission                  Current  Katelyn Cooper is a 62 y.o. old female seen in consultation for cough, pleuritic chest pain. Previous BQ patient   PFT FINDINGS PFT's 08/2015 reviewed with patient ratio 80% Fev1 2.2 L 87% FVC 2.6L 83%  Ct chest 08/30/15 Left lung Nodule approx 6 MM increased from 2-3 MM from 2013.    CT chest 12/2015 Left Lung nodule approx 6 MM-no change from previous CT scans   CT chest 09/05/16 There is a right lower lobe small opacity probable mucus plugging mucoid impaction  CT chest 3.7.19 The left lower lobe nodule has not increased in size significantly from previous CAT scan  CT chest 8/19 Edema and effusions Left Nodule stable  findings reveiwed with patient   ECHO 8/19 left ventricular filling pattern, with   concomitant abnormal relaxation and increased filling pressure   (grade 2 diastolic dysfunction).  CT che  CT chest 10/2017 Edema a dn effusions CHIEF COMPLAINT:   Follow up OSA Admission to hospital   HISTORY OF PRESENT ILLNESS   Doing well with CPAP therapy AHI 6.1 >100% compliance 87%>4 hrs  Recent admission to the hospital for acute pulmonary edema with pleural effusions Patient was given Lasix therapy and resolved after 3 days of admission  CT of the chest was obtained to rule out PE The nodule has been stable over the last several months Findings reviewed with patient   Her sleep apnea seems to be under control with therapy She uses CPAP and benefits from it Uses nasal pillows  There was couple days where the CPAP machine was broken She did feel severe lethargy and excessive daytime sleepiness due to the fact that she missed a couple of days Which proved to [name] that she needs her CPAP daily   Current Medication:   Current Outpatient Medications:  .  albuterol (PROAIR HFA) 108 (90 Base) MCG/ACT  inhaler, Inhale 2 puffs into the lungs every 4 (four) hours as needed for wheezing or shortness of breath., Disp: 1 Inhaler, Rfl: 2 .  aspirin EC 81 MG tablet, Take 1 tablet (81 mg total) by mouth daily., Disp: , Rfl:  .  atorvastatin (LIPITOR) 40 MG tablet, Take 1 tablet (40 mg total) by mouth daily., Disp: 90 tablet, Rfl: 3 .  Calcium Carb-Cholecalciferol (CALCIUM 1000 + D PO), Take 2 tablets by mouth daily. , Disp: , Rfl:  .  Cholecalciferol (VITAMIN D3) 5000 units TABS, Take 1 tablet by mouth daily. , Disp: , Rfl: 0 .  furosemide (LASIX) 20 MG tablet, Take 1 tablet (20 mg total) by mouth daily., Disp: 90 tablet, Rfl: 3 .  glucose blood test strip, ONETOUCH ULTRA MINI STRIPS AND LANCETS. Use one daily to check blood sugar., Disp: 100 each, Rfl: 3 .  lansoprazole (PREVACID) 15 MG capsule, Take 15 mg by mouth daily at 6 (six) AM., Disp: , Rfl:  .  metoprolol tartrate (LOPRESSOR) 25 MG tablet, Take 0.5 tablets (12.5 mg total) by mouth 2 (two) times daily., Disp: 180 tablet, Rfl: 4 .  potassium chloride SA (K-DUR,KLOR-CON) 20 MEQ tablet, Take 2 tablets (40 mEq total) by mouth 2 (two) times daily., Disp: 120 tablet, Rfl: 6 .  QUEtiapine (SEROQUEL) 100 MG tablet, Take 1 tablet (100 mg total) by mouth at bedtime., Disp:  90 tablet, Rfl: 1 .  venlafaxine XR (EFFEXOR-XR) 75 MG 24 hr capsule, Take 1 capsule (75 mg total) by mouth daily with breakfast., Disp: 90 capsule, Rfl: 1 .  zaleplon (SONATA) 10 MG capsule, Take 1 capsule (10 mg total) by mouth at bedtime as needed for sleep. (Patient taking differently: Take 10 mg by mouth at bedtime as needed for sleep. Takes one a night), Disp: 30 capsule, Rfl: 2     ALLERGIES   Lovastatin; Paroxetine hcl; and Lamotrigine     REVIEW OF SYSTEMS   Review of Systems  Constitutional: Negative for chills, fever, malaise/fatigue and weight loss.  HENT: Negative for congestion.   Respiratory: Negative for cough, hemoptysis, sputum production, shortness of  breath and wheezing.   Cardiovascular: Negative for chest pain, palpitations, orthopnea and leg swelling.  Gastrointestinal: Negative for nausea.  All other systems reviewed and are negative.   BP 120/64 (BP Location: Left Arm, Patient Position: Sitting, Cuff Size: Normal)   Pulse 76   Ht 5' 2.5" (1.588 m)   Wt 155 lb 3.2 oz (70.4 kg)   SpO2 98%   BMI 27.93 kg/m     PHYSICAL EXAM   Physical Exam  Constitutional: She is oriented to person, place, and time. No distress.  HENT:  Mouth/Throat: No oropharyngeal exudate.  Cardiovascular: Normal rate, regular rhythm and normal heart sounds.  No murmur heard. Pulmonary/Chest: Effort normal and breath sounds normal. No stridor. No respiratory distress. She has no wheezes. She has no rales.  Musculoskeletal: Normal range of motion. She exhibits no edema.  Neurological: She is alert and oriented to person, place, and time. No cranial nerve deficit.  Skin: Skin is warm. She is not diaphoretic.  Psychiatric: She has a normal mood and affect.        ASSESSMENT/PLAN   62 yo white female with recent admission for grade 2 diastolic heart failure with pulmonary edema and pleural effusions responded well to Lasix therapy in the setting of underlying severe sleep apnea which seems to be well-controlled at this time   Patient has quit smoking and we have congratulated her on her success  Patient with incidental left lung solitary pulm nodule that is stable over past 12  months.   Her OSA has improved with therapy, her reactive airways disease is stable. No acute issues at this time   1.Intermittent reactive airways disease  -Albuterol as needed-  2repeat CT chest reviewed from 10/2017 -LLL nodule is benign at this time   3. grade 2 diastolic dysfunction Lasix as needed and prescribed Follow-up cardiology consultation  4.continue CPAP therapy for OSA -AHI down to 6  Follow up 6 months  Patient are satisfied with Plan of action  and management. All questions answered   [name], M.D.  [facility]  Medical Director [place] Director [facility] Cardio-Pulmonary Department

## 2018-01-09 ENCOUNTER — Other Ambulatory Visit: Payer: Self-pay | Admitting: Internal Medicine

## 2018-01-22 ENCOUNTER — Telehealth: Payer: Self-pay | Admitting: Family Medicine

## 2018-01-22 NOTE — Telephone Encounter (Signed)
Kim Nurse Case Manager with [facility] called to advised that pt was in their case management program but Maudie Mercury has recently lost touch with pt and plans on closing pt's case this week unless Dr. Caryn Section would like to request anything. Please advise. Thanks TNP

## 2018-03-09 IMAGING — CT CT CHEST W/O CM
1 series · 14 of 34 positions shown, 18 images · non-contrast
Comparison: 08/30/2015.

CLINICAL DATA: Left lung nodule.

EXAM:
CT CHEST WITHOUT CONTRAST
TECHNIQUE: Multidetector CT imaging of the chest was performed following the
standard protocol without IV contrast.

[Series 2: thorax · axial · 0.69mm/px · z∈[-630,-354]mm · 14 of 164 slices shown, 18 images]
[im 13/164  mediastinal]
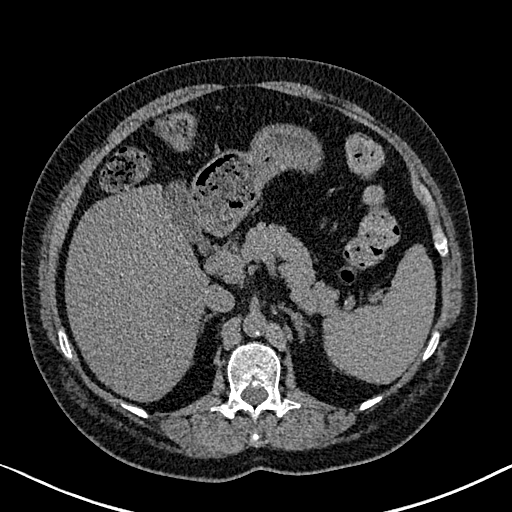
[im 13/164  lung]
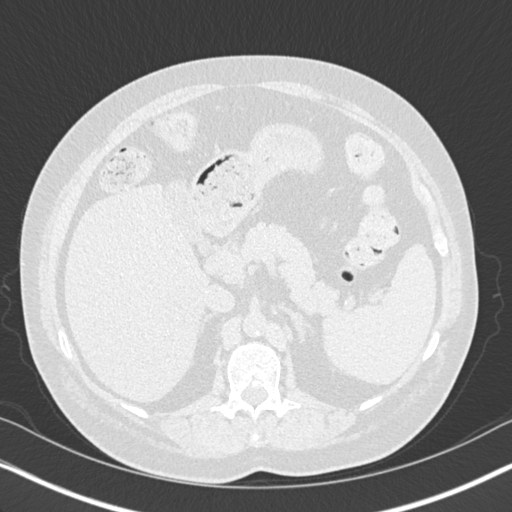
[im 25/164  lung]
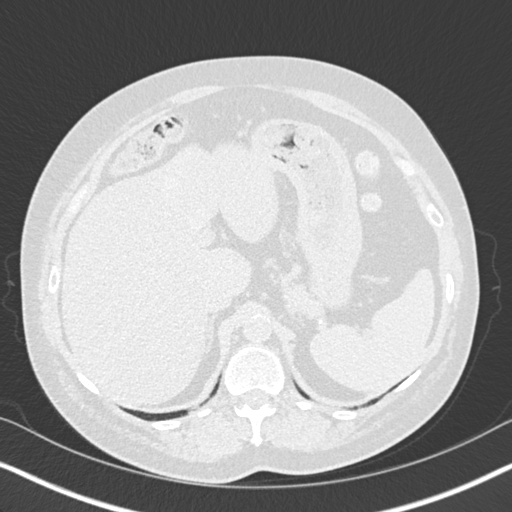
[im 33/164  lung]
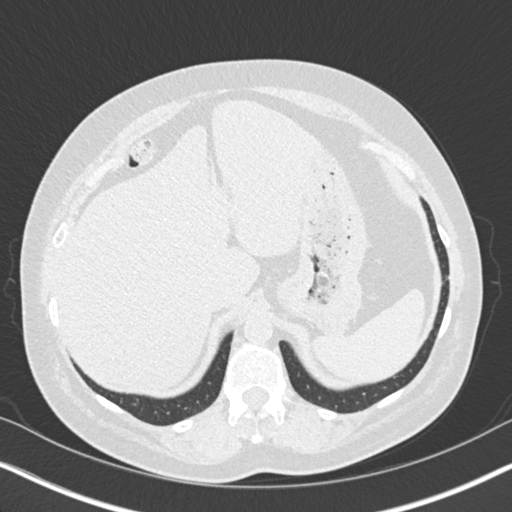
[im 49/164  lung]
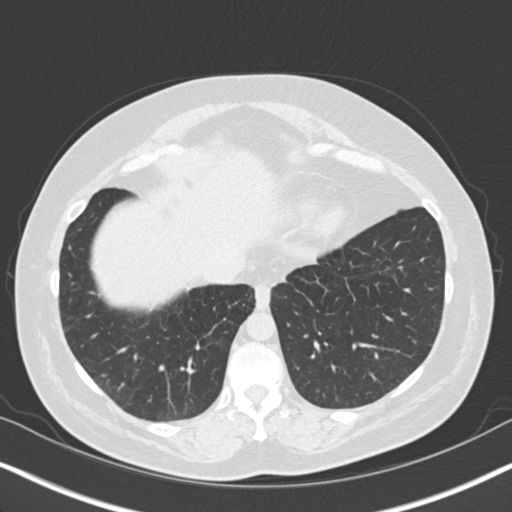
[im 61/164  mediastinal]
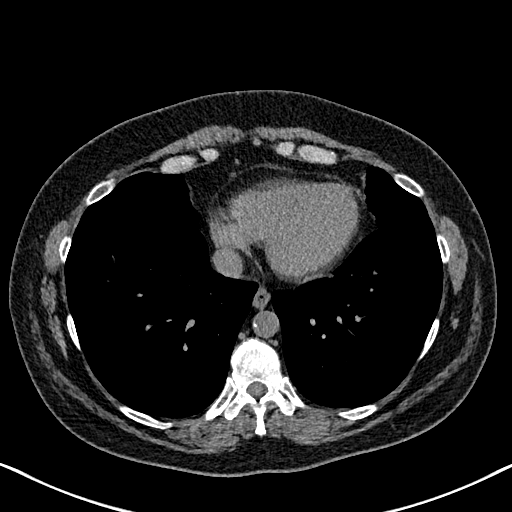
[im 61/164  lung]
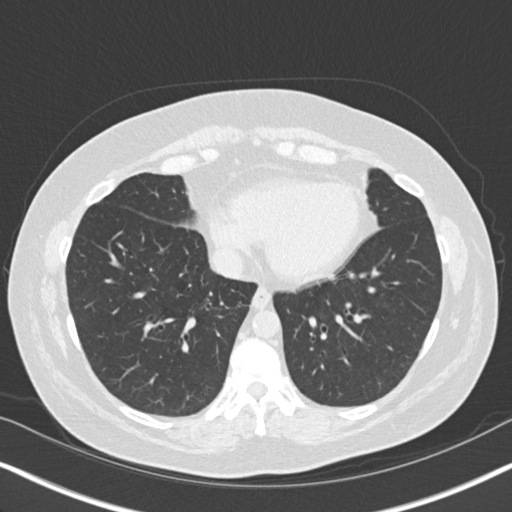
[im 67/164  lung]
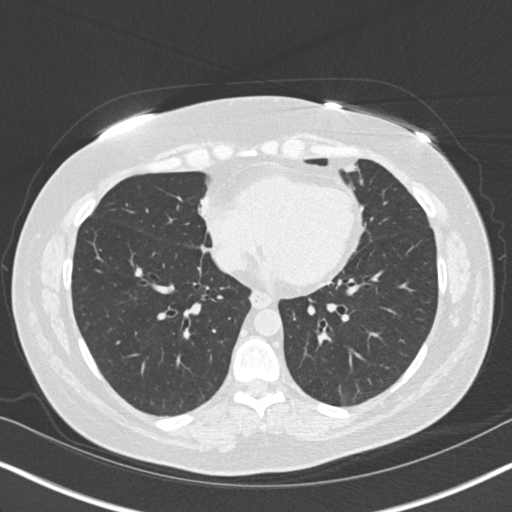
[im 78/164  lung]
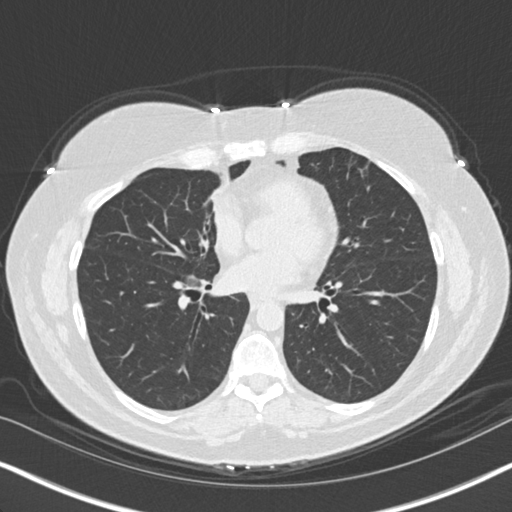
[im 87/164  lung]
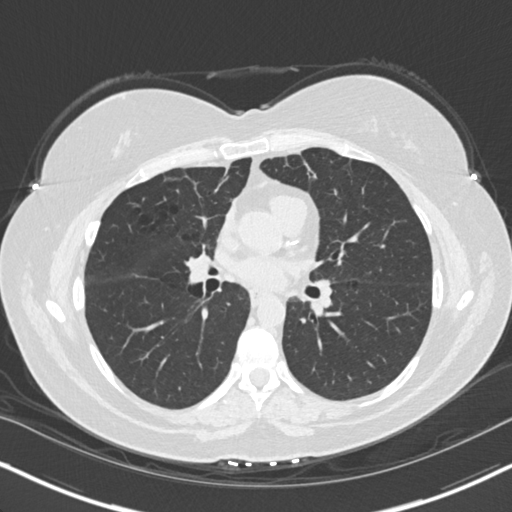
[im 97/164  mediastinal]
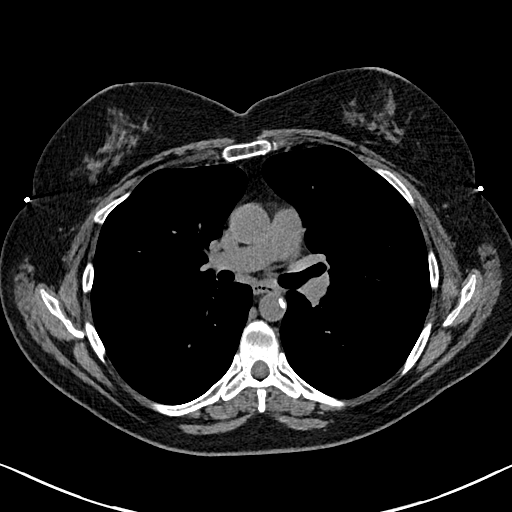
[im 97/164  lung]
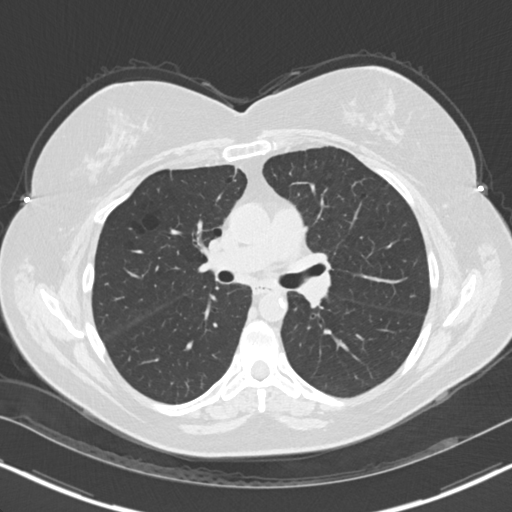
[im 103/164  lung]
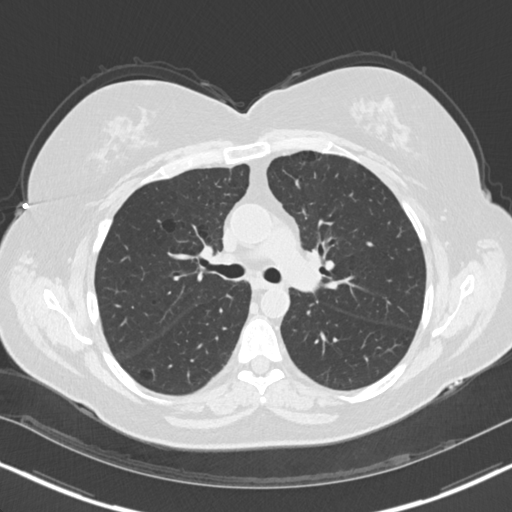
[im 121/164  lung]
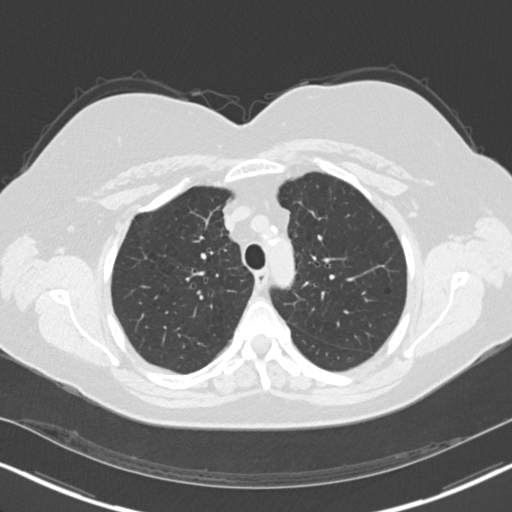
[im 131/164  lung]
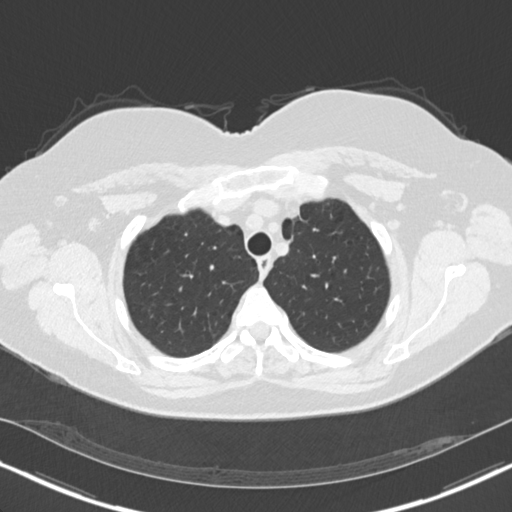
[im 139/164  mediastinal]
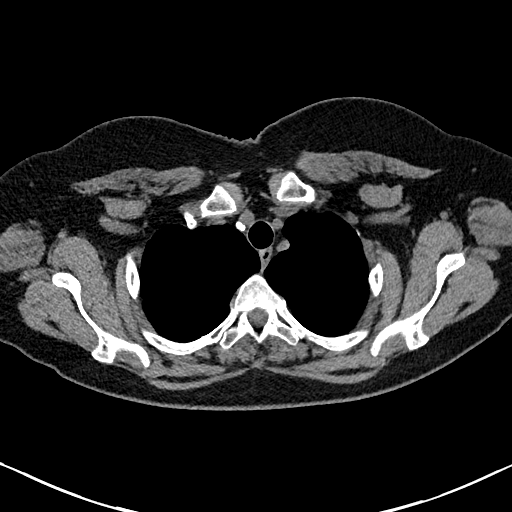
[im 139/164  lung]
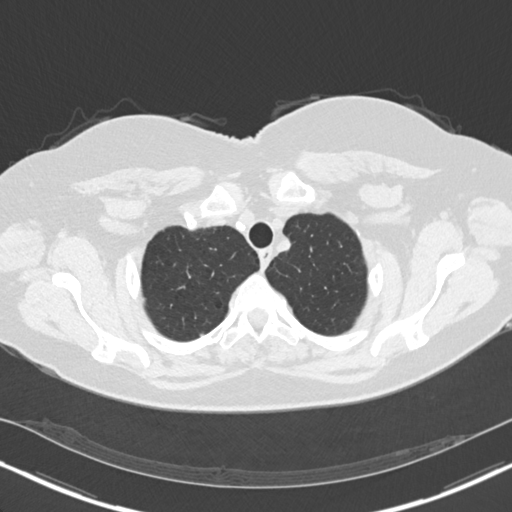
[im 151/164  lung]
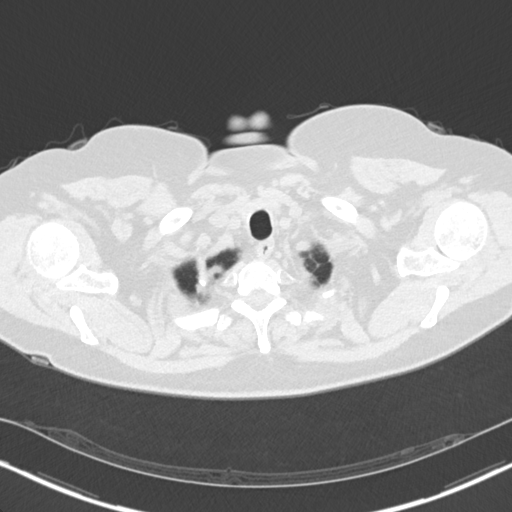

[14 of 34 positions shown; findings below may reference images not displayed]

FINDINGS: Cardiovascular: The heart size is normal. No pericardial effusion.
Coronary artery calcification is noted. Atherosclerotic
calcification is noted in the wall of the throracic aorta.

Mediastinum/Nodes: Stable upper normal size mediastinal lymph nodes.
7 mm short axis subcarinal lymph node was 7 mm previously. No
evidence for gross hilar lymphadenopathy although assessment is
limited by the lack of intravenous contrast on today's study. The
esophagus has normal imaging features. There is no axillary
lymphadenopathy.

Lungs/Pleura: Biapical pleural-parenchymal scarring is evident.
Stable appearance of bronchiectasis with scarring medial right
middle lobe. Nodule in question seen previously along the left major
fissure measures 6 x 2 mm today compared to 6 x 4 mm previously. The
short axis measurement differences probably related to slight
difference in obliquity given that this triangle-shaped perifissural
nodule is oriented along the axis of the fissure. Sagittal
reconstruction show a characteristic triangular configuration for
subpleural lymph node (image 133 series 6).

Upper Abdomen: Diffusely decreased attenuation of liver parenchyma
is compatible with fatty deposition in some subcapsular sparing
noted along the gallbladder fossa. Adrenal glands unremarkable.
Atherosclerotic calcification noted in the abdominal aorta.

Musculoskeletal: Bone windows reveal no worrisome lytic or sclerotic
osseous lesions.
IMPRESSION: 1. Stable appearance 6 mm left perifissural nodule. Likely
subpleural lymph node. Non-contrast chest CT at 6-12 months is
recommended. If the nodule is stable at time of repeat CT, then
future CT at 18-24 months (from today's scan) is considered optional
for low-risk patients, but is recommended for high-risk patients.
This recommendation follows the consensus statement: Guidelines for
Management of Incidental Pulmonary Nodules Detected on CT
Images:From the [HOSPITAL] 7712; published online before
print (10.1148/radiol.[id number]).
Imaging findings of potential clinical significance:

Emphysema. (FLKX2-U94.C)

Abdominal Aortic Atherosclerois (FLKX2-170.0)

Coronary artery atherosclerosis.

1.

## 2018-03-10 ENCOUNTER — Encounter: Payer: Self-pay | Admitting: Psychiatry

## 2018-03-10 ENCOUNTER — Ambulatory Visit (INDEPENDENT_AMBULATORY_CARE_PROVIDER_SITE_OTHER): Payer: 59 | Admitting: Psychiatry

## 2018-03-10 DIAGNOSIS — F3160 Bipolar disorder, current episode mixed, unspecified: Secondary | ICD-10-CM

## 2018-03-10 MED ORDER — ZALEPLON 10 MG PO CAPS: 10.0000 mg | ORAL_CAPSULE | Freq: Every evening | ORAL | 2 refills | Status: DC | PRN

## 2018-03-10 MED ORDER — QUETIAPINE FUMARATE 100 MG PO TABS: 100.0000 mg | ORAL_TABLET | Freq: Every day | ORAL | 1 refills | Status: DC

## 2018-03-10 MED ORDER — VENLAFAXINE HCL ER 75 MG PO CP24: 75.0000 mg | ORAL_CAPSULE | Freq: Every day | ORAL | 1 refills | Status: DC
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
## 2018-03-17 LAB — HM DEXA SCAN

## 2018-03-19 ENCOUNTER — Ambulatory Visit: Payer: Managed Care, Other (non HMO) | Admitting: Internal Medicine

## 2018-03-21 ENCOUNTER — Ambulatory Visit (INDEPENDENT_AMBULATORY_CARE_PROVIDER_SITE_OTHER): Payer: Managed Care, Other (non HMO) | Admitting: Internal Medicine

## 2018-03-21 ENCOUNTER — Encounter: Payer: Self-pay | Admitting: Internal Medicine

## 2018-03-21 VITALS — BP 116/60 | HR 91 | Ht 63.0 in | Wt 157.8 lb

## 2018-03-21 DIAGNOSIS — E785 Hyperlipidemia, unspecified: Secondary | ICD-10-CM | POA: Diagnosis not present

## 2018-03-21 DIAGNOSIS — I2510 Atherosclerotic heart disease of native coronary artery without angina pectoris: Secondary | ICD-10-CM | POA: Diagnosis not present

## 2018-03-21 DIAGNOSIS — I739 Peripheral vascular disease, unspecified: Secondary | ICD-10-CM

## 2018-03-21 DIAGNOSIS — I5032 Chronic diastolic (congestive) heart failure: Secondary | ICD-10-CM | POA: Diagnosis not present

## 2018-03-21 NOTE — Patient Instructions (Signed)
Medication Instructions:  Your physician recommends that you continue on your current medications as directed. Please refer to the Current Medication list given to you today.  If you need a refill on your cardiac medications before your next appointment, please call your pharmacy.   Lab work: Your physician recommends that you return for lab work in: [place], LIPID.  If you have labs (blood work) drawn today and your tests are completely normal, you will receive your results only by: [name] MyChart Message (if you have MyChart) OR . A paper copy in the mail If you have any lab test that is abnormal or we need to change your treatment, we will call you to review the results.  Testing/Procedures: none  Follow-Up: At [facility], you and your health needs are our priority.  As part of our continuing mission to provide you with exceptional heart care, we have created designated Provider Care Teams.  These Care Teams include your primary Cardiologist (physician) and Advanced Practice Providers (APPs -  Physician Assistants and Nurse Practitioners) who all work together to provide you with the care you need, when you need it. You will need a follow up appointment in 6 months.  Please call our office 2 months in advance to schedule this appointment.  You may see Nelva Bush, MD or one of the following Advanced Practice Providers on your designated Care Team:   Murray Hodgkins, NP Christell Faith, PA-C . Marrianne Mood, PA-C

## 2018-03-21 NOTE — Progress Notes (Signed)
Follow-up Outpatient Visit Date: 03/21/2018  Primary Care Provider: [name], MD [address] [place] [address]  Chief Complaint: Follow-up CAD and HFpEF  HPI:  Katelyn Cooper is a 62 y.o. year-old female with history of nonobstructive CAD, PAD status post left iliac stent, diet-controlled DM 2, thoracic outlet syndrome status post bilateral rib resections (2706), GERD complicated by esophageal stricture, HLD, and anxiety/depression, who presents for follow-up of diastolic heart failure and coronary artery disease.  She was last seen in our office in mid September by Ignacia Bayley, NP, at which time she was doing well with continued recovery from her hospitalization in August.  No medication changes were made at that time.  Today, Ms. August reports feeling well.  She has been very careful to limit her salt intake.  She reports mild shortness of breath, having the sensation that she could hear herself breathing, a couple of weeks ago.  This has since resolved.  Her exertional dyspnea is stable.  She denies chest pain, palpitations, lightheadedness, and orthopnea.  She uses CPAP nightly.  She denies claudication.  --------------------------------------------------------------------------------------------------  Cardiovascular History & Procedures: Cardiovascular Problems:  Nonobstructive coronary artery disease  Peripheral vascular status post left iliac stent  Risk Factors:  Diabetes mellitus, hyperlipidemia, peripheral vascular disease  Cath/PCI:  L/RHC (11/08/2017): LMCA with minimal diffuse disease.  LAD normal.  30% stenosis at first septal perforator.  LCx with mild luminal irregularities.  OM1 with 30% disease.  RCA with sequential 30% proximal and mid stenoses.  LHC (01/12/16): LMCA and LAD normal. There is a 40% stenosis in the proximal aspect of the large first septal perforator. There is 30% OM1 stenosis. 30% proximal and mid RCA lesions are also  present. LVEDP mildly elevated at 15-20 mmHg.  CV Surgery:  Remote left iliac stent.  EP Procedures and Devices:  None.  Non-Invasive Evaluation(s):  TTE (11/06/2017): Normal LV size and wall thickness.  LVEF 50-55% with hypokinesis of the anteroseptal and anterior myocardium.  Grade 2 diastolic dysfunction with elevated filling pressures.  Mild MR.  Normal RV size and function.  Mild pulmonary hypertension (PASP 35 mmHg).  Pharmacologic myocardial perfusion stress test (12/23/15): Small, mild area of reversible defect involving the apical anterior and apical segments that could reflect ischemia or breast attenuation. LVEF 55-65%. Borderline TID was noted.  Transthoracic echocardiogram (12/28/15): Normal LV systolic and diastolic function (EF 23-76%). No significant valvular abnormalities. Normal RV size and function.  Aortoiliac and lower extremity Dopplers:Patentstent in the left common iliac artery. No significant abnormalities. ABI right 1.05, left 1.03.  Recent CV Pertinent Labs: Lab Results  Component Value Date   CHOL 148 09/21/2016   HDL 40 09/21/2016   LDLCALC 73 09/21/2016   TRIG 175 (H) 09/21/2016   CHOLHDL 3.7 09/21/2016   INR 1.06 01/06/2016   BNP 731.0 (H) 11/05/2017   K 3.9 12/05/2017   MG 2.5 (H) 11/08/2017   BUN 8 12/05/2017   BUN 9 11/13/2017   CREATININE 0.98 12/05/2017    Past medical and surgical history were reviewed and updated in EPIC.  Current Meds  Medication Sig  . albuterol (PROAIR HFA) 108 (90 Base) MCG/ACT inhaler Inhale 2 puffs into the lungs every 4 (four) hours as needed for wheezing or shortness of breath.  [name] aspirin EC 81 MG tablet Take 1 tablet (81 mg total) by mouth daily.  [name] atorvastatin (LIPITOR) 40 MG tablet Take 1 tablet (40 mg total) by mouth daily.  . Calcium Carb-Cholecalciferol (  CALCIUM 1000 + D PO) Take 2 tablets by mouth daily.   . Cholecalciferol (VITAMIN D3) 5000 units TABS Take 1 tablet by mouth daily.   . furosemide  (LASIX) 20 MG tablet Take 1 tablet (20 mg total) by mouth daily.  [name] glucose blood test strip ONETOUCH ULTRA MINI STRIPS AND LANCETS. Use one daily to check blood sugar.  . lansoprazole (PREVACID) 15 MG capsule Take 15 mg by mouth daily at 6 (six) AM.  . metoprolol tartrate (LOPRESSOR) 25 MG tablet Take 0.5 tablets (12.5 mg total) by mouth 2 (two) times daily.  . potassium chloride SA (K-DUR,KLOR-CON) 20 MEQ tablet Take 2 tablets (40 mEq total) by mouth 2 (two) times daily.  . QUEtiapine (SEROQUEL) 100 MG tablet Take 1 tablet (100 mg total) by mouth at bedtime.  [name] venlafaxine XR (EFFEXOR-XR) 75 MG 24 hr capsule Take 1 capsule (75 mg total) by mouth daily with breakfast.  . zaleplon (SONATA) 10 MG capsule Take 1 capsule (10 mg total) by mouth at bedtime as needed for sleep.    Allergies: Lovastatin; Paroxetine hcl; and Lamotrigine  Social History   Tobacco Use  . Smoking status: Former Smoker    Packs/day: 1.00    Years: 40.00    Pack years: 40.00    Types: Cigarettes    Start date: 03/01/1975    Last attempt to quit: 08/11/2017    Years since quitting: 0.6  . Smokeless tobacco: Never Used  Substance Use Topics  . Alcohol use: No    Alcohol/week: 0.0 standard drinks  . Drug use: No    Family History  Problem Relation Age of Onset  . Lung cancer Mother        was a smoker  . Emphysema Mother   . Cancer Mother        Lung  . Alcohol abuse Mother   . Depression Mother   . Heart disease Mother   . Drug abuse Sister   . Breast cancer Sister   . Emphysema Sister   . Bipolar disorder Sister   . Alcohol abuse Other   . Depression Other   . Bipolar disorder Other   . Heart disease Other   . Vision loss Other   . Alcohol abuse Father   . Mood Disorder Father   . Heart disease Maternal Grandmother   . Stroke Maternal Grandmother   . Esophageal cancer Maternal Aunt   . Colon cancer Neg Hx     Review of Systems: A 12-system review of systems was performed and was negative  except as noted in the HPI.  --------------------------------------------------------------------------------------------------  Physical Exam: BP 116/60 (BP Location: Left Arm, Patient Position: Sitting, Cuff Size: Normal)   Pulse 91   Ht 5\' 3"  (1.6 m)   Wt 157 lb 12 oz (71.6 kg)   BMI 27.94 kg/m   General:  NAD HEENT: No conjunctival pallor or scleral icterus. Moist mucous membranes.  OP clear. Neck: Supple without lymphadenopathy, thyromegaly, JVD, or HJR. Lungs: Normal work of breathing. Clear to auscultation bilaterally without wheezes or crackles. Heart: Regular rate and rhythm without murmurs, rubs, or gallops. Non-displaced PMI. Abd: Bowel sounds present. Soft, NT/ND without hepatosplenomegaly Ext: No lower extremity edema. Radial, PT, and DP pulses are 2+ bilaterally. Skin: Warm and dry without rash.  EKG:  NSR with LBBB.  Lab Results  Component Value Date   WBC 8.7 11/06/2017   HGB 10.4 (L) 11/06/2017   HCT 31.1 (L) 11/06/2017   MCV 83.6 11/06/2017  PLT 181 11/06/2017    Lab Results  Component Value Date   NA 139 12/05/2017   K 3.9 12/05/2017   CL 107 12/05/2017   CO2 25 12/05/2017   BUN 8 12/05/2017   CREATININE 0.98 12/05/2017   GLUCOSE 169 (H) 12/05/2017   ALT 11 11/05/2017    Lab Results  Component Value Date   CHOL 148 09/21/2016   HDL 40 09/21/2016   LDLCALC 73 09/21/2016   TRIG 175 (H) 09/21/2016   CHOLHDL 3.7 09/21/2016    --------------------------------------------------------------------------------------------------  ASSESSMENT AND PLAN: HFpEF Ms. Wurth appears euvolemic with stable NYHA class II symptoms.  We will continue her current medications.  I have encouraged her to remain diligent about minimizing her salt intake.  I will check a BMP today to see if she remains hypokalemic and if we can reduce/stop her potassium supplementation.  Coronary artery disease Mild on catheterization in September.  No angina reported.  Continue  medical therapy.  Hyperlipidemia LDL well-controlled on last check over a year ago.  I will check a lipid panel today.  Continue high-intensity statin therapy with goal LDL < 70, given PAD and non-obstructive CAD.  Peripheral vascular disease No claudication s/p left iliac stent many years ago.  Continue secondary prevention.  Follow-up: Return to clinic in 6 months.  Nelva Bush, MD 03/21/2018 2:46 PM

## 2018-03-22 LAB — LIPID PANEL
Chol/HDL Ratio: 2.7 ratio (ref 0.0–4.4)
Cholesterol, Total: 137 mg/dL (ref 100–199)
HDL: 50 mg/dL (ref >39)
LDL Calculated: 59 mg/dL (ref 0–99)
Triglycerides: 140 mg/dL (ref 0–149)
VLDL Cholesterol Cal: 28 mg/dL (ref 5–40)

## 2018-03-22 LAB — BASIC METABOLIC PANEL
BUN/Creatinine Ratio: 8 — ABNORMAL LOW (ref 12–28)
BUN: 6 mg/dL — ABNORMAL LOW (ref 8–27)
CO2: 22 mmol/L (ref 20–29)
Calcium: 9.1 mg/dL (ref 8.7–10.3)
Chloride: 101 mmol/L (ref 96–106)
Creatinine, Ser: 0.75 mg/dL (ref 0.57–1.00)
GFR calc Af Amer: 98 mL/min/{1.73_m2} (ref >59)
GFR calc non Af Amer: 85 mL/min/{1.73_m2} (ref >59)
Glucose: 110 mg/dL — ABNORMAL HIGH (ref 65–99)
POTASSIUM: 3.8 mmol/L (ref 3.5–5.2)
SODIUM: 139 mmol/L (ref 134–144)

## 2018-04-01 ENCOUNTER — Telehealth: Payer: Self-pay | Admitting: Internal Medicine

## 2018-04-01 NOTE — Telephone Encounter (Signed)
Patient made aware of results and verbalized understanding.  Notes recorded by Nelva Bush, MD on 03/23/2018 at 9:48 PM EST Normal renal function and electrolytes. Lipids at goal. Continue current medications. MyChart message sent to patient.

## 2018-04-01 NOTE — Telephone Encounter (Signed)
Patient calling to discuss recent lab testing results  ° °Please call  ° °

## 2018-04-09 ENCOUNTER — Encounter: Payer: Self-pay | Admitting: Family Medicine

## 2018-04-09 ENCOUNTER — Ambulatory Visit (INDEPENDENT_AMBULATORY_CARE_PROVIDER_SITE_OTHER): Payer: Managed Care, Other (non HMO) | Admitting: Family Medicine

## 2018-04-09 VITALS — BP 136/66 | HR 108 | Temp 98.4°F | Resp 16 | Wt 153.0 lb

## 2018-04-09 DIAGNOSIS — K529 Noninfective gastroenteritis and colitis, unspecified: Secondary | ICD-10-CM

## 2018-04-09 NOTE — Progress Notes (Signed)
  Subjective:     Patient ID: Katelyn Cooper, female   DOB: 06/20/1955, 63 y.o.   MRN: [id number] Chief Complaint  Patient presents with  . Abdominal Pain    Started two days ago. Pt may have had a fever yesterday.   . Diarrhea   HPI States she developed lower abdominal pain and frequent episodes of loose stool after eating at [employer]. States she did use their bathroom while there. No nausea or vomiting but has taken in little food or fluids Reports decreased cramping today and no loose stools.Family members are not ill. No travel abroad or recent abx use. Colonoscopy in 2017 without diverticulosis. Denies constipation prior to onset of sx or dysuria.  Review of Systems     Objective:   Physical Exam Constitutional:      General: She is not in acute distress.    Appearance: She is ill-appearing.  Abdominal:     General: Bowel sounds are decreased.     Tenderness: There is abdominal tenderness ( mild in LLQ). There is no guarding.  Neurological:     Mental Status: She is alert.        Assessment:    1. Gastroenteritis, infectious, presumed    Plan:    Discussed use of imodium and increasing fluid intake with food as tolerated.

## 2018-04-09 NOTE — Patient Instructions (Signed)
Discussed increasing fluids with water and Gatorade. Eat as tolerated. Try imodium AD for stomach cramps. Let us know if not continuing to improve.

## 2018-04-14 ENCOUNTER — Telehealth: Payer: Self-pay | Admitting: Family Medicine

## 2018-04-14 NOTE — Telephone Encounter (Signed)
Patient was advised and states she will wait out for a few days and give call back if no improvement.

## 2018-04-14 NOTE — Telephone Encounter (Signed)
Let her know after the diarrhea it will take her bowels a few days to reestablish a pattern once she is eating normally again. If she needs something for diarrhea Miralax is the best choice and is over the counter.

## 2018-04-14 NOTE — Telephone Encounter (Signed)
Pt states she was seen last week for a problem and now she is having more problems (didn't want to elaborate)  Requesting a call back from the provider or nurse for advise.

## 2018-04-14 NOTE — Telephone Encounter (Signed)
Patient states she has been dealing with constipation since last visit and wanted to know if anything could be called in to the pharmacy for her.

## 2018-04-23 ENCOUNTER — Telehealth: Payer: Self-pay | Admitting: Internal Medicine

## 2018-04-23 DIAGNOSIS — I5032 Chronic diastolic (congestive) heart failure: Secondary | ICD-10-CM

## 2018-04-23 NOTE — Telephone Encounter (Signed)
Pt c/o Shortness Of Breath: STAT if SOB developed within the last 24 hours or pt is noticeably SOB on the phone  1. Are you currently SOB (can you hear that pt is SOB on the phone)? Yes   2. How long have you been experiencing SOB? Yesterday   3. Are you SOB when sitting or when up moving around? Moving around mostly   4. Are you currently experiencing any other symptoms?

## 2018-04-23 NOTE — Telephone Encounter (Signed)
I spoke with the patient. She reports increased SOB since yesterday. She denies weight gain, lower extremity swelling, or abdominal swelling. Per the patient, she will typically go to the mall and walk 3 laps a day. Today she walked a 1/4 of a lap and had to stop to rest due to SOB. She then finished the 1 lap and stopped.   I did confirm with the patient she has had not recent respiratory illness. She denies increased fluid intake, but does confirm that she ate barbeque over the weekend.   Most recent echo done- 11/06/17- EF 50-55% Most recent right & left heart cath- 11/08/17- normal pulmonary pressures, diastolic heart failure Most recent BMP- 03/21/18- BUN/creat- 6/075 K+- 3.8.  Confirmed with the patient that she is taking lasix 20 mg once daily and potassium 20 meq 2 tablets (40 meq) BID.  Reviewed the above with Christell Faith, PA- orders received to increase lasix to 40 mg once daily x 3 days and leave potassium dose the same. She will need a repeat BMP on Monday.  The patient voices understanding of the above recommendations and is agreeable. She will go to the [facility] on Monday for BMP.

## 2018-04-28 ENCOUNTER — Other Ambulatory Visit
Admission: RE | Admit: 2018-04-28 | Discharge: 2018-04-28 | Disposition: A | Discharge status: Home / Self Care | Payer: 59 | Source: Ambulatory Visit | Attending: Physician Assistant | Admitting: Physician Assistant

## 2018-04-28 DIAGNOSIS — I5032 Chronic diastolic (congestive) heart failure: Secondary | ICD-10-CM | POA: Insufficient documentation

## 2018-04-28 LAB — BASIC METABOLIC PANEL
Anion gap: 9 (ref 5–15)
BUN: 9 mg/dL (ref 8–23)
CO2: 24 mmol/L (ref 22–32)
Calcium: 9.3 mg/dL (ref 8.9–10.3)
Chloride: 104 mmol/L (ref 98–111)
Creatinine, Ser: 0.9 mg/dL (ref 0.44–1.00)
GFR calc Af Amer: >60 mL/min (ref >60)
Glucose, Bld: 158 mg/dL — ABNORMAL HIGH (ref 70–99)
Potassium: 4.1 mmol/L (ref 3.5–5.1)
Sodium: 137 mmol/L (ref 135–145)

## 2018-04-30 ENCOUNTER — Other Ambulatory Visit: Payer: Self-pay

## 2018-04-30 MED ORDER — POTASSIUM CHLORIDE CRYS ER 20 MEQ PO TBCR: 40.0000 meq | EXTENDED_RELEASE_TABLET | Freq: Two times a day (BID) | ORAL | 3 refills | Status: DC

## 2018-04-30 NOTE — Telephone Encounter (Signed)
*  STAT* If patient is at the pharmacy, call can be transferred to refill team.   1. Which medications need to be refilled? (please list name of each medication and dose if known) Potassium  2. Which pharmacy/location (including street and city if local pharmacy) is medication to be sent to? [facility]  3. Do they need a 30 day or 90 day supply? [place]

## 2018-05-19 ENCOUNTER — Ambulatory Visit (INDEPENDENT_AMBULATORY_CARE_PROVIDER_SITE_OTHER): Payer: Managed Care, Other (non HMO) | Admitting: Psychiatry

## 2018-05-19 ENCOUNTER — Encounter: Payer: Self-pay | Admitting: Psychiatry

## 2018-05-19 ENCOUNTER — Encounter: Payer: Self-pay | Admitting: Family Medicine

## 2018-05-19 ENCOUNTER — Other Ambulatory Visit: Payer: Self-pay

## 2018-05-19 ENCOUNTER — Ambulatory Visit (INDEPENDENT_AMBULATORY_CARE_PROVIDER_SITE_OTHER): Payer: Managed Care, Other (non HMO) | Admitting: Family Medicine

## 2018-05-19 VITALS — BP 127/67 | HR 90 | Temp 98.2°F | Wt 153.0 lb

## 2018-05-19 VITALS — BP 109/62 | HR 106 | Temp 98.5°F | Wt 154.3 lb

## 2018-05-19 DIAGNOSIS — I5032 Chronic diastolic (congestive) heart failure: Secondary | ICD-10-CM

## 2018-05-19 DIAGNOSIS — F3160 Bipolar disorder, current episode mixed, unspecified: Secondary | ICD-10-CM

## 2018-05-19 DIAGNOSIS — E119 Type 2 diabetes mellitus without complications: Secondary | ICD-10-CM | POA: Diagnosis not present

## 2018-05-19 LAB — POCT UA - MICROALBUMIN: Microalbumin Ur, POC: 20 mg/L

## 2018-05-19 LAB — POCT GLYCOSYLATED HEMOGLOBIN (HGB A1C): Hemoglobin A1C: 7.2 % — AB (ref 4.0–5.6)

## 2018-05-19 MED ORDER — ZALEPLON 10 MG PO CAPS: 10.0000 mg | ORAL_CAPSULE | Freq: Every evening | ORAL | 2 refills | Status: DC | PRN

## 2018-05-19 MED ORDER — VENLAFAXINE HCL ER 75 MG PO CP24: 75.0000 mg | ORAL_CAPSULE | Freq: Every day | ORAL | 1 refills | Status: DC

## 2018-05-19 MED ORDER — QUETIAPINE FUMARATE 100 MG PO TABS: 100.0000 mg | ORAL_TABLET | Freq: Every day | ORAL | 1 refills | Status: DC

## 2018-05-19 MED ORDER — METFORMIN HCL ER 500 MG PO TB24: 500.0000 mg | ORAL_TABLET | Freq: Every evening | ORAL | 1 refills | Status: DC

## 2018-05-19 NOTE — Progress Notes (Signed)
Patient: Katelyn Cooper Female    DOB: [birth year]   63 y.o.   MRN: [id number] Visit Date: 05/19/2018  Today's Provider: Lelon Huh, MD   Chief Complaint  Patient presents with  . Diabetes    6 month fup  . Hypertension   Subjective:     HPI   Diabetes Mellitus Type II, Follow-up:   Lab Results  Component Value Date   HGBA1C 6.8 (A) 11/15/2017   HGBA1C 6.5 (H) 09/21/2016    Last seen for diabetes 6 months ago.  Management since then includes pt was to start on a heart healthy diet and increase exercise. She reports excellent compliance with treatment. She is not having side effects.  Current symptoms include polyuria and have been stable. Home blood sugar records: Mid 100's  Episodes of hypoglycemia? no   Current Insulin Regimen: None Most Recent Eye Exam: 04/2018 Weight trend: stable Prior visit with dietician: no Current diet: in general, a "healthy" diet   Current exercise: walking  Pertinent Labs:    Component Value Date/Time   CHOL 137 03/21/2018 1500   TRIG 140 03/21/2018 1500   HDL 50 03/21/2018 1500   LDLCALC 59 03/21/2018 1500   CREATININE 0.90 04/28/2018 0934    Wt Readings from Last 3 Encounters:  04/09/18 153 lb (69.4 kg)  03/21/18 157 lb 12 oz (71.6 kg)  01/07/18 155 lb 3.2 oz (70.4 kg)    ------------------------------------------------------------------------   Hypertension, follow-up:  BP Readings from Last 3 Encounters:  04/09/18 136/66  03/21/18 116/60  01/07/18 120/64    She was last seen for hypertension 4 months ago.  BP at that visit was 120/64. Management since that visit includes No changes. She reports excellent compliance with treatment. She is not having side effects.  She is exercising. She is adherent to low salt diet.   Outside blood pressures are not being checked regularly. She is experiencing none.  Patient denies chest pain, dyspnea, fatigue and syncope.   Cardiovascular risk factors include  diabetes mellitus, dyslipidemia and hypertension.  Use of agents associated with hypertension: none.     Weight trend: stable Wt Readings from Last 3 Encounters:  04/09/18 153 lb (69.4 kg)  03/21/18 157 lb 12 oz (71.6 kg)  01/07/18 155 lb 3.2 oz (70.4 kg)    Current diet: in general, a "healthy" diet    ------------------------------------------------------------------------    Allergies  Allergen Reactions  . Lovastatin     depression  . Paroxetine Hcl     itching  . Lamotrigine Rash     Current Outpatient Medications:  .  albuterol (PROAIR HFA) 108 (90 Base) MCG/ACT inhaler, Inhale 2 puffs into the lungs every 4 (four) hours as needed for wheezing or shortness of breath., Disp: 1 Inhaler, Rfl: 2 .  aspirin EC 81 MG tablet, Take 1 tablet (81 mg total) by mouth daily., Disp: , Rfl:  .  atorvastatin (LIPITOR) 40 MG tablet, Take 1 tablet (40 mg total) by mouth daily., Disp: 90 tablet, Rfl: 3 .  Cholecalciferol (VITAMIN D3) 5000 units TABS, Take 1 tablet by mouth daily. , Disp: , Rfl: 0 .  glucose blood test strip, ONETOUCH ULTRA MINI STRIPS AND LANCETS. Use one daily to check blood sugar., Disp: 100 each, Rfl: 3 .  lansoprazole (PREVACID) 15 MG capsule, Take 15 mg by mouth daily at 6 (six) AM., Disp: , Rfl:  .  metoprolol tartrate (LOPRESSOR) 25 MG tablet, Take 0.5 tablets (12.5 mg total)  by mouth 2 (two) times daily., Disp: 180 tablet, Rfl: 4 .  potassium chloride SA (K-DUR,KLOR-CON) 20 MEQ tablet, Take 2 tablets (40 mEq total) by mouth 2 (two) times daily., Disp: 120 tablet, Rfl: 3 .  QUEtiapine (SEROQUEL) 100 MG tablet, Take 1 tablet (100 mg total) by mouth at bedtime., Disp: 90 tablet, Rfl: 1 .  venlafaxine XR (EFFEXOR-XR) 75 MG 24 hr capsule, Take 1 capsule (75 mg total) by mouth daily with breakfast., Disp: 90 capsule, Rfl: 1 .  zaleplon (SONATA) 10 MG capsule, Take 1 capsule (10 mg total) by mouth at bedtime as needed for sleep., Disp: 30 capsule, Rfl: 2 .  furosemide  (LASIX) 20 MG tablet, Take 1 tablet (20 mg total) by mouth daily., Disp: 90 tablet, Rfl: 3  Review of Systems  Constitutional: Negative.   Respiratory: Negative.   Cardiovascular: Negative.   Gastrointestinal: Negative.   Endocrine: Positive for polyuria. Negative for cold intolerance, heat intolerance, polydipsia and polyphagia.  Musculoskeletal: Negative.   Neurological: Negative for dizziness, weakness, light-headedness and headaches.    Social History   Tobacco Use  . Smoking status: Former Smoker    Packs/day: 1.00    Years: 40.00    Pack years: 40.00    Types: Cigarettes    Start date: 03/01/1975    Last attempt to quit: 08/11/2017    Years since quitting: 0.7  . Smokeless tobacco: Never Used  Substance Use Topics  . Alcohol use: No    Alcohol/week: 0.0 standard drinks      Objective:   BP 127/67 (BP Location: Right Arm, Patient Position: Sitting, Cuff Size: Normal)   Pulse 90   Temp 98.2 F (36.8 C) (Oral)   Wt 153 lb (69.4 kg)   BMI 27.10 kg/m     Physical Exam   General Appearance:    Alert, cooperative, no distress  Eyes:    PERRL, conjunctiva/corneas clear, EOM's intact       Lungs:     Clear to auscultation bilaterally, respirations unlabored  Heart:    Regular rate and rhythm  Neurologic:   Awake, alert, oriented x 3. No apparent focal neurological           defect.       Results for orders placed or performed in visit on 05/19/18  POCT UA - Microalbumin  Result Value Ref Range   Microalbumin Ur, POC 20 mg/L  POCT glycosylated hemoglobin (Hb A1C)  Result Value Ref Range   Hemoglobin A1C 7.2 (A) 4.0 - 5.6 %       Assessment & Plan    1. Controlled type 2 diabetes mellitus without complication, without long-term current use of insulin (HCC) a1c continues to climb, will add- metFORMIN (GLUCOPHAGE-XR) 500 MG 24 hr tablet; Take 1 tablet (500 mg total) by mouth every evening.  Dispense: 90 tablet; Refill: 1  Future Appointments  Date Time  Provider [place]  09/16/2018  9:40 AM Caryn Section, Kirstie Peri, MD [facility] None       Lelon Huh, MD  [facility]
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
## 2018-05-19 NOTE — Patient Instructions (Signed)
.   Please review the attached list of medications and notify my office if there are any errors.   . Please bring all of your medications to every appointment so we can make sure that our medication list is the same as yours.   

## 2018-08-04 ENCOUNTER — Other Ambulatory Visit: Payer: Self-pay | Admitting: Internal Medicine

## 2018-08-04 MED ORDER — POTASSIUM CHLORIDE CRYS ER 20 MEQ PO TBCR: 40.0000 meq | EXTENDED_RELEASE_TABLET | Freq: Two times a day (BID) | ORAL | 3 refills | Status: DC

## 2018-08-04 NOTE — Telephone Encounter (Signed)
Refill sent for Potassium chl 20 meq 2 tablets twice a day.

## 2018-08-04 NOTE — Telephone Encounter (Signed)
°*  STAT* If patient is at the pharmacy, call can be transferred to refill team.   1. Which medications need to be refilled? (please list name of each medication and dose if known) potassium chloride 20 MEQ 2 tablets 2 times daily  2. Which pharmacy/location (including street and city if local pharmacy) is medication to be sent to? [place]  3. Do they need a 30 day or 90 day supply? 90 day

## 2018-08-18 ENCOUNTER — Other Ambulatory Visit: Payer: Self-pay

## 2018-08-18 ENCOUNTER — Ambulatory Visit (INDEPENDENT_AMBULATORY_CARE_PROVIDER_SITE_OTHER): Payer: 59 | Admitting: Psychiatry

## 2018-08-18 ENCOUNTER — Encounter: Payer: Self-pay | Admitting: Psychiatry

## 2018-08-18 DIAGNOSIS — F4310 Post-traumatic stress disorder, unspecified: Secondary | ICD-10-CM

## 2018-08-18 DIAGNOSIS — F3160 Bipolar disorder, current episode mixed, unspecified: Secondary | ICD-10-CM

## 2018-08-18 MED ORDER — QUETIAPINE FUMARATE 100 MG PO TABS: 100.0000 mg | ORAL_TABLET | Freq: Every day | ORAL | 1 refills | Status: DC

## 2018-08-18 MED ORDER — VENLAFAXINE HCL ER 75 MG PO CP24: 75.0000 mg | ORAL_CAPSULE | Freq: Every day | ORAL | 1 refills | Status: DC

## 2018-08-18 NOTE — Progress Notes (Signed)
Patient ID: Katelyn Cooper, female   DOB: 1955-12-23, 63 y.o.   MRN: [id number]  Patient is a 63 year old female who was contacted for her medications. Patient reported that she has been doing well and trying to stay safe due to the Covid. She stated that the medications are helping her and she has been compliant with the medications. Patient reported that she does not go out and has been staying healthy. We discussed about her medications in detail. She reported that she is becoming apprehensive due to the current situation and wants it to and quickly. She is not planning to attend any groups or churches in the recent environment. She denied having any suicidal or homicidal ideations or plans. She denied having any perceptual disturbances. She's able to contract for safety.  Plan Continue medications as prescribed. I discussed the assessment and treatment plan with the patient. The patient was provided an opportunity to ask questions and all were answered. The patient agreed with the plan and demonstrated an understanding of the instructions.   The patient was advised to call back or seek an in-person evaluation if the symptoms worsen or if the condition fails to improve as anticipated.   I provided 15 minutes of non-face-to-face time during this encounter.

## 2018-08-29 ENCOUNTER — Telehealth: Payer: Self-pay | Admitting: Internal Medicine

## 2018-08-29 NOTE — Telephone Encounter (Signed)
Called patient for COVID-19 pre-screening for in office visit.  Have you recently traveled any where out of the local area in the last 2 weeks? no  Have you been in close contact with a person diagnosed with COVID-19 within the last 2 weeks? no  Do you currently have any of the following symptoms? If so, when did they start? Cough     Diarrhea   Joint Pain Fever      Muscle Pain   Red eyes Shortness of breath   Abdominal pain  Vomiting Loss of smell    Rash    Sore Throat Headache    Weakness   Bruising or bleeding   Okay to proceed with visit.  

## 2018-09-01 ENCOUNTER — Ambulatory Visit (INDEPENDENT_AMBULATORY_CARE_PROVIDER_SITE_OTHER): Payer: Managed Care, Other (non HMO) | Admitting: Internal Medicine

## 2018-09-01 ENCOUNTER — Encounter: Payer: Self-pay | Admitting: Internal Medicine

## 2018-09-01 ENCOUNTER — Other Ambulatory Visit: Payer: Self-pay

## 2018-09-01 VITALS — BP 120/60 | HR 89 | Ht 63.0 in | Wt 151.4 lb

## 2018-09-01 DIAGNOSIS — J449 Chronic obstructive pulmonary disease, unspecified: Secondary | ICD-10-CM

## 2018-09-01 NOTE — Progress Notes (Signed)
[place] [facility] Consultation     Date: 09/01/2018,   MRN# [id number] Katelyn Cooper - [place] 1956/02/23     AdmissionWeight: 151 lb 6.4 oz (68.7 kg)                 CurrentWeight: 151 lb 6.4 oz (68.7 kg) Katelyn Cooper is a 63 y.o. old female seen in consultation for cough, pleuritic chest pain. Previous BQ patient   PFT FINDINGS PFT's 08/2015 reviewed with patient ratio 80% Fev1 2.2 L 87% FVC 2.6L 83%  Ct chest 08/30/15 Left lung Nodule approx 6 MM increased from 2-3 MM from 2013.    CT chest 12/2015 Left Lung nodule approx 6 MM-no change from previous CT scans   CT chest 09/05/16 There is a right lower lobe small opacity probable mucus plugging mucoid impaction  CT chest 3.7.19 The left lower lobe nodule has not increased in size significantly from previous CAT scan  CT chest 8/19 Edema and effusions Left Nodule stable  findings reveiwed with patient   ECHO 8/19 left ventricular filling pattern, with   concomitant abnormal relaxation and increased filling pressure   (grade 2 diastolic dysfunction).  CT che  CT chest 10/2017 Edema a dn effusions CHIEF COMPLAINT:   Follow-up COPD Follow-up OSA   HISTORY OF PRESENT ILLNESS   COPD seems to be under control at this time Patient intermittently uses albuterol No maintenance therapy at this time Quit tobacco 1 year ago  Her sleep apnea is under control with CPAP therapy Great compliance report AHI  is down to 5 Patient uses nasal pillows No signs of infection at this time No signs of COPD exacerbation No signs of acute heart failure at this time     Current Medication:   Current Outpatient Medications:  .  albuterol (PROAIR HFA) 108 (90 Base) MCG/ACT inhaler, Inhale 2 puffs into the lungs every 4 (four) hours as needed for wheezing or shortness of breath., Disp: 1 Inhaler, Rfl: 2 .  aspirin EC 81 MG tablet, Take 1 tablet (81 mg total) by mouth daily., Disp: , Rfl:  .  atorvastatin (LIPITOR) 40 MG  tablet, Take 1 tablet (40 mg total) by mouth daily., Disp: 90 tablet, Rfl: 3 .  Cholecalciferol (VITAMIN D3) 5000 units TABS, Take 1 tablet by mouth daily. , Disp: , Rfl: 0 .  glucose blood test strip, ONETOUCH ULTRA MINI STRIPS AND LANCETS. Use one daily to check blood sugar., Disp: 100 each, Rfl: 3 .  lansoprazole (PREVACID) 15 MG capsule, Take 15 mg by mouth daily at 6 (six) AM., Disp: , Rfl:  .  metFORMIN (GLUCOPHAGE-XR) 500 MG 24 hr tablet, Take 1 tablet (500 mg total) by mouth every evening., Disp: 90 tablet, Rfl: 1 .  metoprolol tartrate (LOPRESSOR) 25 MG tablet, Take 0.5 tablets (12.5 mg total) by mouth 2 (two) times daily., Disp: 180 tablet, Rfl: 4 .  potassium chloride SA (K-DUR) 20 MEQ tablet, Take 2 tablets (40 mEq total) by mouth 2 (two) times daily., Disp: 120 tablet, Rfl: 3 .  QUEtiapine (SEROQUEL) 100 MG tablet, Take 1 tablet (100 mg total) by mouth at bedtime., Disp: 90 tablet, Rfl: 1 .  venlafaxine XR (EFFEXOR-XR) 75 MG 24 hr capsule, Take 1 capsule (75 mg total) by mouth daily with breakfast., Disp: 90 capsule, Rfl: 1 .  zaleplon (SONATA) 10 MG capsule, Take 1 capsule (10 mg total) by mouth at bedtime as needed for sleep., Disp: 30 capsule, Rfl: 2 .  furosemide (LASIX) 20  MG tablet, Take 1 tablet (20 mg total) by mouth daily., Disp: 90 tablet, Rfl: 3     ALLERGIES   Lovastatin; Paroxetine hcl; and Lamotrigine     REVIEW OF SYSTEMS   Review of Systems  Constitutional: Negative for chills, fever, malaise/fatigue and weight loss.  HENT: Negative for congestion.   Respiratory: Negative for cough, hemoptysis, sputum production, shortness of breath and wheezing.   Cardiovascular: Negative for chest pain, palpitations, orthopnea and leg swelling.  Gastrointestinal: Negative for nausea.  Psychiatric/Behavioral: Depression:    All other systems reviewed and are negative.   BP 120/60 (BP Location: Left Arm, Cuff Size: Normal)   Pulse 89   Ht 5\' 3"  (1.6 m)   Wt 151 lb  6.4 oz (68.7 kg)   SpO2 96%   BMI 26.82 kg/m     PHYSICAL EXAM   Physical Exam  Constitutional: She is oriented to person, place, and time. No distress.  HENT:  Mouth/Throat: No oropharyngeal exudate.  Cardiovascular: Normal rate, regular rhythm and normal heart sounds.  No murmur heard. Pulmonary/Chest: Effort normal and breath sounds normal. No stridor. No respiratory distress. She has no wheezes. She has no rales.  Musculoskeletal: Normal range of motion.        General: No edema.  Neurological: She is alert and oriented to person, place, and time. No cranial nerve deficit.  Skin: Skin is warm. She is not diaphoretic.  Psychiatric: She has a normal mood and affect.         ASSESSMENT/PLAN   63 year old white female with grade 2 diastolic heart failure with underlying severe sleep apnea which seems to be well-controlled at this time  Patient has quit smoking approximately 1 year ago She has had a left lung solitary pulmonary nodule that she says is been stable over the last 12 months  OSA has improved with therapy and her underlying COPD is stable No acute issues at this time  Intermittent shortness of breath and wheezing Due to mild COPD Continues albuterol as needed  CT chest August 2019 shows left lower lobe nodule is benign and stable Other CT scans of time  Grade 2 diastolic dysfunction Follow-up cardiology as scheduled  Continue CPAP therapy for OSA AHI is down to 5 Continue auto CPAP 5 to 20 cm of pressure 100% greater than 4 hours   COVID-19 EDUCATION: The signs and symptoms of COVID-19 were discussed with the patient and how to seek care for testing.  The importance of social distancing was discussed today. Hand Washing Techniques and avoid touching face was advised.  MEDICATION ADJUSTMENTS/LABS AND TESTS ORDERED: Follow-up compliance report in the next 6 months  CURRENT MEDICATIONS REVIEWED AT LENGTH WITH PATIENT TODAY   Patient  satisfied with  Plan of action and management. All questions answered Follow up in 6 months   Austina Constantin Patricia Pesa, M.D.  [facility]  Medical Director [place] Director [facility] Cardio-Pulmonary Department

## 2018-09-01 NOTE — Patient Instructions (Addendum)
Continue ALBUTEROL AS NEEDED  CONTINUE CPAP AS PRESCRIBED

## 2018-09-09 ENCOUNTER — Other Ambulatory Visit: Payer: Self-pay | Admitting: Psychiatry

## 2018-09-11 ENCOUNTER — Telehealth: Payer: Self-pay

## 2018-09-11 NOTE — Telephone Encounter (Signed)
Patient called requesting a refill on her Zaleplon 10mg . She stated she is out of medication. Patient has a scheduled  appointment on 11/17/18. She is using [facility] on [address]. Please review and advise. Thank you.

## 2018-09-15 ENCOUNTER — Other Ambulatory Visit: Payer: Self-pay | Admitting: Psychiatry

## 2018-09-15 MED ORDER — ZALEPLON 10 MG PO CAPS: 10.0000 mg | ORAL_CAPSULE | Freq: Every evening | ORAL | 2 refills | Status: DC | PRN

## 2018-09-15 NOTE — Telephone Encounter (Signed)
Pt called left message that she needs a refill on her medications. Sleep medications.  Needs to be sent to [facility]

## 2018-09-16 ENCOUNTER — Ambulatory Visit: Payer: Self-pay | Admitting: Family Medicine

## 2018-09-23 ENCOUNTER — Encounter: Payer: Self-pay | Admitting: Family Medicine

## 2018-09-23 ENCOUNTER — Ambulatory Visit (INDEPENDENT_AMBULATORY_CARE_PROVIDER_SITE_OTHER): Payer: Managed Care, Other (non HMO) | Admitting: Family Medicine

## 2018-09-23 ENCOUNTER — Other Ambulatory Visit: Payer: Self-pay

## 2018-09-23 VITALS — BP 109/68 | HR 84 | Temp 98.5°F | Wt 151.0 lb

## 2018-09-23 DIAGNOSIS — E785 Hyperlipidemia, unspecified: Secondary | ICD-10-CM

## 2018-09-23 DIAGNOSIS — I10 Essential (primary) hypertension: Secondary | ICD-10-CM | POA: Diagnosis not present

## 2018-09-23 DIAGNOSIS — E119 Type 2 diabetes mellitus without complications: Secondary | ICD-10-CM | POA: Diagnosis not present

## 2018-09-23 LAB — POCT GLYCOSYLATED HEMOGLOBIN (HGB A1C): Hemoglobin A1C: 7 % — AB (ref 4.0–5.6)

## 2018-09-23 NOTE — Patient Instructions (Signed)
.   Please review the attached list of medications and notify my office if there are any errors.   . Please bring all of your medications to every appointment so we can make sure that our medication list is the same as yours.   

## 2018-09-23 NOTE — Progress Notes (Signed)
Patient: Katelyn Cooper Female    DOB: October 31, 1955   63 y.o.   MRN: [id number] Visit Date: 09/23/2018  Today's Provider: Lelon Huh, MD   Chief Complaint  Patient presents with  . Hypertension  . Hyperlipidemia  . Diabetes   Subjective:     HPI     Diabetes Mellitus Type II, Follow-up:   Lab Results  Component Value Date   HGBA1C 7.2 (A) 05/19/2018   HGBA1C 6.8 (A) 11/15/2017   HGBA1C 6.5 (H) 09/21/2016   Last seen for diabetes 4 months ago.  Management since then inc ludes Added Metformin XR 500mg  once a day. She reports excellent compliance with treatment. She is not having side effects.  Current symptoms include polyuria and have been stable. Home blood sugar records: are not being checked  Episodes of hypoglycemia? no   Current Insulin Regimen: None Most Recent Eye Exam: UTD  Weight trend: stable  Current diet: in general, a "healthy" diet   Current exercise: Some  ------------------------------------------------------------------------   Hypertension, follow-up:  BP Readings from Last 3 Encounters:  09/01/18 120/60  05/19/18 127/67  04/09/18 136/66    She was last seen for hypertension 4 months ago.  BP at that visit was 120/64. Management since that visit includes No changes She reports excellent compliance with treatment. She is not having side effects.  She is exercising. She is adherent to low salt diet.   Outside blood pressures are not being checked. She is experiencing none.  Patient denies chest pain, dyspnea, lower extremity edema and palpitations.   Cardiovascular risk factors include diabetes mellitus, dyslipidemia and hypertension.  Use of agents associated with hypertension: none.   ------------------------------------------------------------------------    Lipid/Cholesterol, Follow-up:   Last seen for this 6 months ago.  Management since that visit includes No changes.  Last Lipid Panel:    Component Value  Date/Time   CHOL 137 03/21/2018 1500   TRIG 140 03/21/2018 1500   HDL 50 03/21/2018 1500   CHOLHDL 2.7 03/21/2018 1500   LDLCALC 59 03/21/2018 1500    She reports excellent compliance with treatment. She is not having side effects.   Wt Readings from Last 3 Encounters:  09/01/18 151 lb 6.4 oz (68.7 kg)  05/19/18 153 lb (69.4 kg)  04/09/18 153 lb (69.4 kg)    ------------------------------------------------------------------------    Allergies  Allergen Reactions  . Lovastatin     depression  . Paroxetine Hcl     itching  . Lamotrigine Rash     Current Outpatient Medications:  .  albuterol (PROAIR HFA) 108 (90 Base) MCG/ACT inhaler, Inhale 2 puffs into the lungs every 4 (four) hours as needed for wheezing or shortness of breath., Disp: 1 Inhaler, Rfl: 2 .  aspirin EC 81 MG tablet, Take 1 tablet (81 mg total) by mouth daily., Disp: , Rfl:  .  atorvastatin (LIPITOR) 40 MG tablet, Take 1 tablet (40 mg total) by mouth daily., Disp: 90 tablet, Rfl: 3 .  Cholecalciferol (VITAMIN D3) 5000 units TABS, Take 1 tablet by mouth daily. , Disp: , Rfl: 0 .  furosemide (LASIX) 20 MG tablet, Take 1 tablet (20 mg total) by mouth daily., Disp: 90 tablet, Rfl: 3 .  glucose blood test strip, ONETOUCH ULTRA MINI STRIPS AND LANCETS. Use one daily to check blood sugar., Disp: 100 each, Rfl: 3 .  lansoprazole (PREVACID) 15 MG capsule, Take 15 mg by mouth daily at 6 (six) AM., Disp: , Rfl:  .  metFORMIN (GLUCOPHAGE-XR) 500 MG 24 hr tablet, Take 1 tablet (500 mg total) by mouth every evening., Disp: 90 tablet, Rfl: 1 .  metoprolol tartrate (LOPRESSOR) 25 MG tablet, Take 0.5 tablets (12.5 mg total) by mouth 2 (two) times daily., Disp: 180 tablet, Rfl: 4 .  potassium chloride SA (K-DUR) 20 MEQ tablet, Take 2 tablets (40 mEq total) by mouth 2 (two) times daily., Disp: 120 tablet, Rfl: 3 .  QUEtiapine (SEROQUEL) 100 MG tablet, Take 1 tablet (100 mg total) by mouth at bedtime., Disp: 90 tablet, Rfl: 1 .   venlafaxine XR (EFFEXOR-XR) 75 MG 24 hr capsule, Take 1 capsule (75 mg total) by mouth daily with breakfast., Disp: 90 capsule, Rfl: 1 .  zaleplon (SONATA) 10 MG capsule, Take 1 capsule (10 mg total) by mouth at bedtime as needed for sleep., Disp: 30 capsule, Rfl: 2  Review of Systems  Constitutional: Negative.   Respiratory: Positive for cough and wheezing. Negative for apnea, choking, chest tightness, shortness of breath and stridor.   Cardiovascular: Negative.   Gastrointestinal: Negative.   Endocrine: Negative.   Neurological: Negative for dizziness, light-headedness and headaches.    Social History   Tobacco Use  . Smoking status: Former Smoker    Packs/day: 1.00    Years: 40.00    Pack years: 40.00    Types: Cigarettes    Start date: 03/01/1975    Quit date: 08/11/2017    Years since quitting: 1.1  . Smokeless tobacco: Never Used  Substance Use Topics  . Alcohol use: No    Alcohol/week: 0.0 standard drinks      Objective:   BP 109/68 (BP Location: Right Arm, Patient Position: Sitting, Cuff Size: Normal)   Pulse 84   Temp 98.5 F (36.9 C) (Oral)   Wt 151 lb (68.5 kg)   BMI 26.75 kg/m  There were no vitals filed for this visit.   Physical Exam  General appearance: alert, well developed, well nourished, cooperative and in no distress Head: Normocephalic, without obvious abnormality, atraumatic Respiratory: Respirations even and unlabored, normal respiratory rate Extremities: No gross deformities Skin: Skin color, texture, turgor normal. No rashes seen  Psych: Appropriate mood and affect. Neurologic: Mental status: Alert, oriented to person, place, and time, thought content appropriate.  Results for orders placed or performed in visit on 09/23/18  POCT glycosylated hemoglobin (Hb A1C)  Result Value Ref Range   Hemoglobin A1C 7.0 (A) 4.0 - 5.6 %       Assessment & Plan    1. Essential hypertension Well controlled.  Continue current medications.    2.  Controlled type 2 diabetes mellitus without complication, without long-term current use of insulin ([place]) Well controlled.  Continue current medications.  Advised to if her lot of metformin is recalled we will change to metformin IR  3. Hyperlipidemia LDL goal <70 She is tolerating atorvastatin well with no adverse effects.       Lelon Huh, MD  [facility]

## 2018-09-24 ENCOUNTER — Telehealth: Payer: Self-pay | Admitting: *Deleted

## 2018-09-24 NOTE — Telephone Encounter (Signed)

## 2018-09-26 ENCOUNTER — Other Ambulatory Visit: Payer: Self-pay

## 2018-09-26 ENCOUNTER — Ambulatory Visit (INDEPENDENT_AMBULATORY_CARE_PROVIDER_SITE_OTHER): Payer: Managed Care, Other (non HMO) | Admitting: Internal Medicine

## 2018-09-26 ENCOUNTER — Encounter: Payer: Self-pay | Admitting: Internal Medicine

## 2018-09-26 VITALS — BP 126/60 | HR 93 | Ht 63.0 in | Wt 151.2 lb

## 2018-09-26 DIAGNOSIS — I739 Peripheral vascular disease, unspecified: Secondary | ICD-10-CM | POA: Diagnosis not present

## 2018-09-26 DIAGNOSIS — I5032 Chronic diastolic (congestive) heart failure: Secondary | ICD-10-CM

## 2018-09-26 DIAGNOSIS — E785 Hyperlipidemia, unspecified: Secondary | ICD-10-CM | POA: Diagnosis not present

## 2018-09-26 DIAGNOSIS — I2510 Atherosclerotic heart disease of native coronary artery without angina pectoris: Secondary | ICD-10-CM

## 2018-09-26 NOTE — Progress Notes (Signed)
Follow-up Outpatient Visit Date: 09/26/2018  Primary Care Provider: [name], MD [address] [place] [address]  Chief Complaint: Shortness of breath  HPI:  Katelyn Cooper is a 63 y.o. year-old female with history of nonobstructive CAD, PAD status post left iliac stent, HFpEF, diet-controlled DM 2, thoracic outlet syndrome status post bilateral rib resections (9371), GERD complicated by esophageal stricture, HLD, and anxiety/depression, who presents for follow-up of CAD and HFpEF.  I last saw her in December, at which time she was doing well other than mild shortness of breath from time to time.  We did not make any medication changes at that visit.  Today, Katelyn Cooper reports that she is feeling well.  She notes 2 episodes over the last 6 months during which she generally felt unwell with some shortness of breath, chest heaviness, and malaise.  She doubled her furosemide for 2 to 3 days during both events and had improvement in the symptoms.  She has not noticed any swelling nor orthopnea.  She uses CPAP nightly.  She denies exertional chest pain, palpitations, and lightheadedness.  Katelyn Cooper had been walking regularly up until the COVID-19 pandemic began.  Since then, she has been mostly sedentary.  She is tolerating her medications well.  --------------------------------------------------------------------------------------------------  Cardiovascular History & Procedures: Cardiovascular Problems:  Nonobstructive coronary artery disease  Peripheral vascular status post left iliac stent  HFpEF  Risk Factors:  Diabetes mellitus, hyperlipidemia, peripheral vascular disease  Cath/PCI:  L/RHC (11/08/2017): LMCA with minimal diffuse disease. LAD normal. 30% stenosis at first septal perforator. LCx with mild luminal irregularities. OM1 with 30% disease. RCA with sequential 30% proximal and mid stenoses.  LHC (01/12/16): LMCA and LAD normal. There is a 40%  stenosis in the proximal aspect of the large first septal perforator. There is 30% OM1 stenosis. 30% proximal and mid RCA lesions are also present. LVEDP mildly elevated at 15-20 mmHg.  CV Surgery:  Remote left iliac stent.  EP Procedures and Devices:  None.  Non-Invasive Evaluation(s):  TTE (11/06/2017): Normal LV size and wall thickness. LVEF 50-55% with hypokinesis of the anteroseptal and anterior myocardium. Grade 2 diastolic dysfunction with elevated filling pressures. Mild MR. Normal RV size and function. Mild pulmonary hypertension (PASP 35 mmHg).  Pharmacologic myocardial perfusion stress test (12/23/15): Small, mild area of reversible defect involving the apical anterior and apical segments that could reflect ischemia or breast attenuation. LVEF 55-65%. Borderline TID was noted.  Transthoracic echocardiogram (12/28/15): Normal LV systolic and diastolic function (EF 69-67%). No significant valvular abnormalities. Normal RV size and function.  Aortoiliac and lower extremity Dopplers:Patentstent in the left common iliac artery. No significant abnormalities. ABI right 1.05, left 1.03.  Recent CV Pertinent Labs: Lab Results  Component Value Date   CHOL 137 03/21/2018   HDL 50 03/21/2018   LDLCALC 59 03/21/2018   TRIG 140 03/21/2018   CHOLHDL 2.7 03/21/2018   INR 1.06 01/06/2016   BNP 731.0 (H) 11/05/2017   K 4.1 04/28/2018   MG 2.5 (H) 11/08/2017   BUN 9 04/28/2018   BUN 6 (L) 03/21/2018   CREATININE 0.90 04/28/2018    Past medical and surgical history were reviewed and updated in EPIC.  Current Meds  Medication Sig  . albuterol (PROAIR HFA) 108 (90 Base) MCG/ACT inhaler Inhale 2 puffs into the lungs every 4 (four) hours as needed for wheezing or shortness of breath.  [name] aspirin EC 81 MG tablet Take 1 tablet (81 mg total) by mouth  daily.  . atorvastatin (LIPITOR) 40 MG tablet Take 1 tablet (40 mg total) by mouth daily.  . Cholecalciferol (VITAMIN D3) 5000 units  TABS Take 1 tablet by mouth daily.   . furosemide (LASIX) 20 MG tablet Take 1 tablet (20 mg total) by mouth daily.  [name] glucose blood test strip ONETOUCH ULTRA MINI STRIPS AND LANCETS. Use one daily to check blood sugar.  . lansoprazole (PREVACID) 15 MG capsule Take 15 mg by mouth daily at 6 (six) AM.  . metFORMIN (GLUCOPHAGE-XR) 500 MG 24 hr tablet Take 1 tablet (500 mg total) by mouth every evening.  . metoprolol tartrate (LOPRESSOR) 25 MG tablet Take 0.5 tablets (12.5 mg total) by mouth 2 (two) times daily.  . potassium chloride SA (K-DUR) 20 MEQ tablet Take 2 tablets (40 mEq total) by mouth 2 (two) times daily.  . QUEtiapine (SEROQUEL) 100 MG tablet Take 1 tablet (100 mg total) by mouth at bedtime.  [name] venlafaxine XR (EFFEXOR-XR) 75 MG 24 hr capsule Take 1 capsule (75 mg total) by mouth daily with breakfast.  . zaleplon (SONATA) 10 MG capsule Take 1 capsule (10 mg total) by mouth at bedtime as needed for sleep.    Allergies: Lovastatin, Paroxetine hcl, and Lamotrigine  Social History   Tobacco Use  . Smoking status: Former Smoker    Packs/day: 1.00    Years: 40.00    Pack years: 40.00    Types: Cigarettes    Start date: 03/01/1975    Quit date: 08/11/2017    Years since quitting: 1.1  . Smokeless tobacco: Never Used  Substance Use Topics  . Alcohol use: No    Alcohol/week: 0.0 standard drinks  . Drug use: No    Family History  Problem Relation Age of Onset  . Lung cancer Mother        was a smoker  . Emphysema Mother   . Cancer Mother        Lung  . Alcohol abuse Mother   . Depression Mother   . Heart disease Mother   . Drug abuse Sister   . Breast cancer Sister   . Emphysema Sister   . Bipolar disorder Sister   . Alcohol abuse Other   . Depression Other   . Bipolar disorder Other   . Heart disease Other   . Vision loss Other   . Alcohol abuse Father   . Mood Disorder Father   . Heart disease Maternal Grandmother   . Stroke Maternal Grandmother   . Esophageal  cancer Maternal Aunt   . Colon cancer Neg Hx     Review of Systems: A 12-system review of systems was performed and was negative except as noted in the HPI.  --------------------------------------------------------------------------------------------------  Physical Exam: BP 126/60 (BP Location: Left Arm, Patient Position: Sitting, Cuff Size: Normal)   Pulse 93   Ht 5\' 3"  (1.6 m)   Wt 151 lb 4 oz (68.6 kg)   BMI 26.79 kg/m   General: NAD. HEENT: No conjunctival pallor or scleral icterus.  Facemask in place Neck: Supple without lymphadenopathy, thyromegaly, JVD, or HJR. Lungs: Normal work of breathing. Clear to auscultation bilaterally without wheezes or crackles. Heart: Regular rate and rhythm without murmurs, rubs, or gallops. Non-displaced PMI. Abd: Bowel sounds present. Soft, NT/ND without hepatosplenomegaly Ext: No lower extremity edema. Radial, PT, and DP pulses are 2+ bilaterally. Skin: Warm and dry without rash.  EKG: Normal sinus rhythm with IVCD in LBBB pattern.  No significant change from prior  tracing.  Lab Results  Component Value Date   WBC 8.7 11/06/2017   HGB 10.4 (L) 11/06/2017   HCT 31.1 (L) 11/06/2017   MCV 83.6 11/06/2017   PLT 181 11/06/2017    Lab Results  Component Value Date   NA 137 04/28/2018   K 4.1 04/28/2018   CL 104 04/28/2018   CO2 24 04/28/2018   BUN 9 04/28/2018   CREATININE 0.90 04/28/2018   GLUCOSE 158 (H) 04/28/2018   ALT 11 11/05/2017    Lab Results  Component Value Date   CHOL 137 03/21/2018   HDL 50 03/21/2018   LDLCALC 59 03/21/2018   TRIG 140 03/21/2018   CHOLHDL 2.7 03/21/2018    --------------------------------------------------------------------------------------------------  ASSESSMENT AND PLAN: Chronic HFpEF: Ms. Dolinger appears euvolemic and well compensated today with NYHA class II symptoms.  I think it is reasonable to continue her current regimen.  She can take additional furosemide for weight gain, swelling,  and shortness of breath.  Coronary artery disease: Other than two episodes of chest discomfort in the setting of dyspnea and generalized malaise that likely reflect exacerbations of her HFpEF, Ms. Dicenso has been asymptomatic.  Catheterization last year showed stable nonobstructive CAD.  We will continue with aspirin and atorvastatin for secondary prevention.  Peripheral vascular disease: No claudication, s/p remote left iliac stent.  Continue aspirin and atorvastatin for secondary prevention.  Hyperlipidemia: LDL at goal on last check in 03/2018 (less than 70).  Continue atorvastatin 40 mg daily.  Follow-up: Return to clinic in 6 months.  Nelva Bush, MD 09/26/2018 1:37 PM

## 2018-09-26 NOTE — Patient Instructions (Signed)
Medication Instructions:  Your physician recommends that you continue on your current medications as directed. Please refer to the Current Medication list given to you today.  If you need a refill on your cardiac medications before your next appointment, please call your pharmacy.   Lab work: None ordered If you have labs (blood work) drawn today and your tests are completely normal, you will receive your results only by: . MyChart Message (if you have MyChart) OR . A paper copy in the mail If you have any lab test that is abnormal or we need to change your treatment, we will call you to review the results.  Testing/Procedures: None ordered  Follow-Up: At [facility], you and your health needs are our priority.  As part of our continuing mission to provide you with exceptional heart care, we have created designated Provider Care Teams.  These Care Teams include your primary Cardiologist (physician) and Advanced Practice Providers (APPs -  Physician Assistants and Nurse Practitioners) who all work together to provide you with the care you need, when you need it. You will need a follow up appointment in 6 months.  Please call our office 2 months in advance to schedule this appointment.  You may see Christopher End, MD or one of the following Advanced Practice Providers on your designated Care Team:   Christopher Berge, NP Ryan Dunn, PA-C . Jacquelyn Visser, PA-C     

## 2018-10-27 ENCOUNTER — Other Ambulatory Visit: Payer: Self-pay | Admitting: Family Medicine

## 2018-10-27 DIAGNOSIS — E119 Type 2 diabetes mellitus without complications: Secondary | ICD-10-CM

## 2018-10-29 ENCOUNTER — Other Ambulatory Visit: Payer: Self-pay

## 2018-10-29 ENCOUNTER — Ambulatory Visit (INDEPENDENT_AMBULATORY_CARE_PROVIDER_SITE_OTHER): Payer: Managed Care, Other (non HMO) | Admitting: Physician Assistant

## 2018-10-29 ENCOUNTER — Encounter: Payer: Self-pay | Admitting: Physician Assistant

## 2018-10-29 VITALS — Wt 150.0 lb

## 2018-10-29 DIAGNOSIS — M545 Low back pain, unspecified: Secondary | ICD-10-CM

## 2018-10-29 MED ORDER — IBUPROFEN 600 MG PO TABS: 600.0000 mg | ORAL_TABLET | Freq: Three times a day (TID) | ORAL | 0 refills | Status: DC | PRN

## 2018-10-29 MED ORDER — CYCLOBENZAPRINE HCL 5 MG PO TABS: 5.0000 mg | ORAL_TABLET | Freq: Three times a day (TID) | ORAL | 0 refills | Status: DC | PRN

## 2018-10-29 MED ORDER — SULFAMETHOXAZOLE-TRIMETHOPRIM 800-160 MG PO TABS: 1.0000 | ORAL_TABLET | Freq: Two times a day (BID) | ORAL | 0 refills | Status: DC

## 2018-10-29 NOTE — Patient Instructions (Signed)
Lumbar Strain A lumbar strain, which is sometimes called a low-back strain, is a stretch or tear in a muscle or the strong cords of tissue that attach muscle to bone (tendons) in the lower back (lumbar spine). This type of injury occurs when muscles or tendons are torn or are stretched beyond their limits. Lumbar strains can range from mild to severe. Mild strains may involve stretching a muscle or tendon without tearing it. These may heal in 1-2 weeks. More severe strains involve tearing of muscle fibers or tendons. These will cause more pain and may take 6-8 weeks to heal. What are the causes? This condition may be caused by:  Trauma, such as a fall or a hit to the body.  Twisting or overstretching the back. This may result from doing activities that need a lot of energy, such as lifting heavy objects. What increases the risk? This injury is more common in:  Athletes.  People with obesity.  People who do repeated lifting, bending, or other movements that involve their back. What are the signs or symptoms? Symptoms of this condition may include:  Sharp or dull pain in the lower back that does not go away. The pain may extend to the buttocks.  Stiffness or limited range of motion.  Sudden muscle tightening (spasms). How is this diagnosed? This condition may be diagnosed based on:  Your symptoms.  Your medical history.  A physical exam.  Imaging tests, such as: ? X-rays. ? MRI. How is this treated? Treatment for this condition may include:  Rest.  Applying heat and cold to the affected area.  Over-the-counter medicines to help relieve pain and inflammation, such as NSAIDs.  Prescription pain medicine and muscle relaxants may be needed for a short time.  Physical therapy. Follow these instructions at home: Managing pain, stiffness, and swelling      If directed, put ice on the injured area during the first 24 hours after your injury. ? Put ice in a plastic bag.  ? Place a towel between your skin and the bag. ? Leave the ice on for 20 minutes, 2-3 times a day.  If directed, apply heat to the affected area as often as told by your health care provider. Use the heat source that your health care provider recommends, such as a moist heat pack or a heating pad. ? Place a towel between your skin and the heat source. ? Leave the heat on for 20-30 minutes. ? Remove the heat if your skin turns bright red. This is especially important if you are unable to feel pain, heat, or cold. You may have a greater risk of getting burned. Activity  Rest and return to your normal activities as told by your health care provider. Ask your health care provider what activities are safe for you.  Do exercises as told by your health care provider. Medicines  Take over-the-counter and prescription medicines only as told by your health care provider.  Ask your health care provider if the medicine prescribed to you: ? Requires you to avoid driving or using heavy machinery. ? Can cause constipation. You may need to take these actions to prevent or treat constipation:  Drink enough fluid to keep your urine pale yellow.  Take over-the-counter or prescription medicines.  Eat foods that are high in fiber, such as beans, whole grains, and fresh fruits and vegetables.  Limit foods that are high in fat and processed sugars, such as fried or sweet foods. Injury prevention To prevent  a future low-back injury:  Always warm up properly before physical activity or sports.  Cool down and stretch after being active.  Use correct form when playing sports and lifting heavy objects. Bend your knees before you lift heavy objects.  Use good posture when sitting and standing.  Stay physically fit and keep a healthy weight. ? Do at least 150 minutes of moderate-intensity exercise each week, such as brisk walking or water aerobics. ? Do strength exercises at least 2 times each week.   General instructions  Do not use any products that contain nicotine or tobacco, such as cigarettes, e-cigarettes, and chewing tobacco. If you need help quitting, ask your health care provider.  Keep all follow-up visits as told by your health care provider. This is important. Contact a health care provider if:  Your back pain does not improve after 6 weeks of treatment.  Your symptoms get worse. Get help right away if:  Your back pain is severe.  You are unable to stand or walk.  You develop pain in your legs.  You develop weakness in your buttocks or legs.  You have difficulty controlling when you urinate or when you have a bowel movement. ? You have frequent, painful, or bloody urination. ? You have a temperature over 101.63F (38.3C) Summary  A lumbar strain, which is sometimes called a low-back strain, is a stretch or tear in a muscle or the strong cords of tissue that attach muscle to bone (tendons) in the lower back (lumbar spine).  This type of injury occurs when muscles or tendons are torn or are stretched beyond their limits.  Rest and return to your normal activities as told by your health care provider. If directed, apply heat and ice to the affected area as often as told by your health care provider.  Take over-the-counter and prescription medicines only as told by your health care provider.  Contact a health care provider if you have new or worsening symptoms. This information is not intended to replace advice given to you by your health care provider. Make sure you discuss any questions you have with your health care provider. Document Released: 03/19/2005 Document Revised: 01/16/2018 Document Reviewed: 01/16/2018 Elsevier Patient Education  [place]. Pyelonephritis, Adult  Pyelonephritis is an infection that occurs in the kidney. The kidneys are organs that help clean the blood by moving waste out of the blood and into the pee (urine). This infection  can happen quickly, or it can last for a long time. In most cases, it clears up with treatment and does not cause other problems. What are the causes? This condition may be caused by:  Germs (bacteria) going from the bladder up to the kidney. This may happen after having a bladder infection.  Germs going from the blood to the kidney. What increases the risk? This condition is more likely to develop in:  Pregnant women.  Older people.  People who have any of these conditions: ? Diabetes. ? Inflammation of the prostate gland (prostatitis), in males. ? Kidney stones or bladder stones. ? Other problems with the kidney or the parts of your body that carry pee from the kidneys to the bladder (ureters). ? Cancer.  People who have a small, thin tube (catheter) placed in the bladder.  People who are sexually active.  Women who use a medicine that kills sperm (spermicide) to prevent pregnancy.  People who have had a prior urinary tract infection (UTI). What are the signs or symptoms? Symptoms  of this condition include:  Peeing often.  A strong urge to pee right away.  Burning or stinging when peeing.  Belly pain.  Back pain.  Pain in the side (flank area).  Fever or chills.  Blood in the pee, or dark pee.  Feeling sick to your stomach (nauseous) or throwing up (vomiting). How is this treated? This condition may be treated by:  Taking antibiotic medicines by mouth (orally).  Drinking enough fluids. If the infection is bad, you may need to stay in the hospital. You may be given antibiotics and fluids that are put directly into a vein through an IV tube. In some cases, other treatments may be needed. Follow these instructions at home: Medicines  Take your antibiotic medicine as told by your doctor. Do not stop taking the antibiotic even if you start to feel better.  Take over-the-counter and prescription medicines only as told by your doctor. General instructions    Drink enough fluid to keep your pee pale yellow.  Avoid caffeine, tea, and carbonated drinks.  Pee (urinate) often. Avoid holding in pee for long periods of time.  Pee before and after sex.  After pooping (having a bowel movement), women should wipe from front to back. Use each tissue only once.  Keep all follow-up visits as told by your doctor. This is important. Contact a doctor if:  You do not feel better after 2 days.  Your symptoms get worse.  You have a fever. Get help right away if:  You cannot take your medicine or drink fluids as told.  You have chills and shaking.  You throw up.  You have very bad pain in your side or back.  You feel very weak or you pass out (faint). Summary  Pyelonephritis is an infection that occurs in the kidney.  In most cases, this infection clears up with treatment and does not cause other problems.  Take your antibiotic medicine as told by your doctor. Do not stop taking the antibiotic even if you start to feel better.  Drink enough fluid to keep your pee pale yellow. This information is not intended to replace advice given to you by your health care provider. Make sure you discuss any questions you have with your health care provider. Document Released: 04/26/2004 Document Revised: 01/21/2018 Document Reviewed: 01/21/2018 Elsevier Patient Education  2020 [employer].

## 2018-10-29 NOTE — Progress Notes (Signed)
Patient: Katelyn Cooper Female    DOB: [birth year]   63 y.o.   MRN: [id number] Visit Date: 10/29/2018  Today's Provider: Mar Daring, PA-C   Chief Complaint  Patient presents with  . Back Pain   Subjective:     Virtual Visit via Telephone Note  I connected with Katelyn Cooper on 10/29/18 at  2:20 PM EDT by telephone and verified that I am speaking with the correct person using two identifiers.  Location: Patient: Home Provider: [facility]   I discussed the limitations, risks, security and privacy concerns of performing an evaluation and management service by telephone and the availability of in person appointments. I also discussed with the patient that there may be a patient responsible charge related to this service. The patient expressed understanding and agreed to proceed.   Back Pain This is a new problem. The current episode started 1 to 4 weeks ago. The problem occurs constantly. The problem has been gradually worsening since onset. The pain is present in the lumbar spine (Lower back pain, Right side pain). The quality of the pain is described as shooting and stabbing. The pain does not radiate. Pain scale: today 5/10. The pain is the same all the time. The symptoms are aggravated by twisting and position. Pertinent negatives include no leg pain. (Right side flank) Treatments tried: Tylenol. The treatment provided no relief.   Patient states it feels like when she has had a kidney infection in the past. No fever or nausea/vomiting.  Allergies  Allergen Reactions  . Lovastatin     depression  . Paroxetine Hcl     itching  . Lamotrigine Rash     Current Outpatient Medications:  .  albuterol (PROAIR HFA) 108 (90 Base) MCG/ACT inhaler, Inhale 2 puffs into the lungs every 4 (four) hours as needed for wheezing or shortness of breath., Disp: 1 Inhaler, Rfl: 2 .  aspirin EC 81 MG tablet, Take 1 tablet (81 mg total) by mouth daily., Disp: , Rfl:  .  atorvastatin  (LIPITOR) 40 MG tablet, Take 1 tablet (40 mg total) by mouth daily., Disp: 90 tablet, Rfl: 3 .  Cholecalciferol (VITAMIN D3) 5000 units TABS, Take 1 tablet by mouth daily. , Disp: , Rfl: 0 .  glucose blood test strip, ONETOUCH ULTRA MINI STRIPS AND LANCETS. Use one daily to check blood sugar., Disp: 100 each, Rfl: 3 .  lansoprazole (PREVACID) 15 MG capsule, Take 15 mg by mouth daily at 6 (six) AM., Disp: , Rfl:  .  metFORMIN (GLUCOPHAGE-XR) 500 MG 24 hr tablet, TAKE 1 TABLET BY MOUTH EVERY EVENING, Disp: 90 tablet, Rfl: 3 .  metoprolol tartrate (LOPRESSOR) 25 MG tablet, Take 0.5 tablets (12.5 mg total) by mouth 2 (two) times daily., Disp: 180 tablet, Rfl: 4 .  potassium chloride SA (K-DUR) 20 MEQ tablet, Take 2 tablets (40 mEq total) by mouth 2 (two) times daily., Disp: 120 tablet, Rfl: 3 .  QUEtiapine (SEROQUEL) 100 MG tablet, Take 1 tablet (100 mg total) by mouth at bedtime., Disp: 90 tablet, Rfl: 1 .  venlafaxine XR (EFFEXOR-XR) 75 MG 24 hr capsule, Take 1 capsule (75 mg total) by mouth daily with breakfast., Disp: 90 capsule, Rfl: 1 .  zaleplon (SONATA) 10 MG capsule, Take 1 capsule (10 mg total) by mouth at bedtime as needed for sleep., Disp: 30 capsule, Rfl: 2 .  furosemide (LASIX) 20 MG tablet, Take 1 tablet (20 mg total) by mouth daily., Disp: 90  tablet, Rfl: 3  Review of Systems  Constitutional: Negative.   Respiratory: Negative.   Cardiovascular: Negative.   Genitourinary: Negative.   Musculoskeletal: Positive for back pain and myalgias.  Neurological: Negative.     Social History   Tobacco Use  . Smoking status: Former Smoker    Packs/day: 1.00    Years: 40.00    Pack years: 40.00    Types: Cigarettes    Start date: 03/01/1975    Quit date: 08/11/2017    Years since quitting: 1.2  . Smokeless tobacco: Never Used  Substance Use Topics  . Alcohol use: No    Alcohol/week: 0.0 standard drinks      Objective:   Wt 150 lb (68 kg)   BMI 26.57 kg/m  Vitals:   10/29/18  1359  Weight: 150 lb (68 kg)     Physical Exam Vitals signs reviewed.  Constitutional:      General: She is not in acute distress.    Appearance: Normal appearance. She is well-developed. She is not ill-appearing.  HENT:     Head: Normocephalic and atraumatic.  Neck:     Musculoskeletal: Normal range of motion and neck supple.  Pulmonary:     Effort: Pulmonary effort is normal. No respiratory distress.  Neurological:     Mental Status: She is alert.  Psychiatric:        Mood and Affect: Mood normal.        Behavior: Behavior normal.        Thought Content: Thought content normal.        Judgment: Judgment normal.      No results found for any visits on 10/29/18.     Assessment & Plan     1. Acute right-sided low back pain without sciatica Limited due to evisit on exam and true diagnosis. Will cover her with Bactrim as below for possible kidney infection. Will also give IBU and flexeril as below for possible muscle strain/spasm. Call if worsening or not improving for in office evaluation.  - sulfamethoxazole-trimethoprim (BACTRIM DS) 800-160 MG tablet; Take 1 tablet by mouth 2 (two) times daily.  Dispense: 14 tablet; Refill: 0 - cyclobenzaprine (FLEXERIL) 5 MG tablet; Take 1 tablet (5 mg total) by mouth 3 (three) times daily as needed for muscle spasms.  Dispense: 30 tablet; Refill: 0 - ibuprofen (ADVIL) 600 MG tablet; Take 1 tablet (600 mg total) by mouth every 8 (eight) hours as needed.  Dispense: 30 tablet; Refill: 0   I discussed the assessment and treatment plan with the patient. The patient was provided an opportunity to ask questions and all were answered. The patient agreed with the plan and demonstrated an understanding of the instructions.   The patient was advised to call back or seek an in-person evaluation if the symptoms worsen or if the condition fails to improve as anticipated.  I provided 16 minutes of non-face-to-face time during this encounter.     Mar Daring, PA-C  [facility]

## 2018-11-04 ENCOUNTER — Other Ambulatory Visit: Payer: Self-pay | Admitting: Internal Medicine

## 2018-11-17 ENCOUNTER — Other Ambulatory Visit: Payer: Self-pay

## 2018-11-17 ENCOUNTER — Ambulatory Visit (INDEPENDENT_AMBULATORY_CARE_PROVIDER_SITE_OTHER): Payer: 59 | Admitting: Psychiatry

## 2018-11-17 ENCOUNTER — Encounter: Payer: Self-pay | Admitting: Psychiatry

## 2018-11-17 DIAGNOSIS — F3160 Bipolar disorder, current episode mixed, unspecified: Secondary | ICD-10-CM

## 2018-11-17 DIAGNOSIS — F4310 Post-traumatic stress disorder, unspecified: Secondary | ICD-10-CM | POA: Diagnosis not present

## 2018-11-17 MED ORDER — QUETIAPINE FUMARATE 100 MG PO TABS: 100.0000 mg | ORAL_TABLET | Freq: Every day | ORAL | 1 refills | Status: DC

## 2018-11-17 MED ORDER — ZALEPLON 10 MG PO CAPS: 10.0000 mg | ORAL_CAPSULE | Freq: Every evening | ORAL | 2 refills | Status: DC | PRN

## 2018-11-17 MED ORDER — VENLAFAXINE HCL ER 75 MG PO CP24: 75.0000 mg | ORAL_CAPSULE | Freq: Every day | ORAL | 1 refills | Status: DC

## 2018-11-17 NOTE — Progress Notes (Signed)
Patient ID: Katelyn Cooper, female   DOB: 02/12/1956, 63 y.o.   MRN: [id number]   Patient is a 63 year old female who was followed up for medication management. She reported that she has been spending most of the time at home due to her comorbid medical conditions. She stated that her husband has been going out once a week to bring some groceries. She stated that she does not want to go out as she has lung problems and she wants to stay safe and does not want to get sick. She reported that she has been spending time watching TV and Netflix. Does not have any other acute symptoms. She takes her sleeping pill sonata on the regular basis. She has been sleeping long hours. She denied having any suicidal or homicidal ideation the plans. No other acute symptoms noted at this time.  Plan Patient will continue on her medications asPrescribed. I called in the refill for Sonata to the local pharmacy. She will follow-up in three months or later depending on her symptoms.   I connected with patient via telemedicine application and verified that I am speaking with the correct person using two identifiers.  I discussed the limitations of evaluation and management by telemedicine and the availability of in person appointments. The patient expressed understanding and agreed to proceed.   I discussed the assessment and treatment plan with the patient. The patient was provided an opportunity to ask questions and all were answered. The patient agreed with the plan and demonstrated an understanding of the instructions.   The patient was advised to call back or seek an in-person evaluation if the symptoms worsen or if the condition fails to improve as anticipated.   I provided 15 minutes of non-face-to-face time during this encounter.

## 2018-12-02 ENCOUNTER — Other Ambulatory Visit: Payer: Self-pay | Admitting: Internal Medicine

## 2019-01-02 ENCOUNTER — Telehealth: Payer: Self-pay | Admitting: Family Medicine

## 2019-01-02 NOTE — Telephone Encounter (Signed)
Pt got a flu vaccine at her husbands work but wants to know if she needs any other vaccines .  CB#  [phone]  Con [place]

## 2019-01-06 NOTE — Telephone Encounter (Signed)
Patient was advised that she would be due to complete the shingrix series and when she turns 64 in January it would be recommended that she get Prevnar 13. KW

## 2019-01-08 ENCOUNTER — Other Ambulatory Visit: Payer: Self-pay | Admitting: Physician Assistant

## 2019-01-28 ENCOUNTER — Other Ambulatory Visit: Payer: Self-pay | Admitting: *Deleted

## 2019-01-28 DIAGNOSIS — Z20822 Contact with and (suspected) exposure to covid-19: Secondary | ICD-10-CM

## 2019-01-29 LAB — NOVEL CORONAVIRUS, NAA: SARS-CoV-2, NAA: NOT DETECTED

## 2019-02-04 ENCOUNTER — Encounter: Payer: Self-pay | Admitting: *Deleted

## 2019-02-04 ENCOUNTER — Ambulatory Visit
Admission: EM | Admit: 2019-02-04 | Discharge: 2019-02-04 | Disposition: A | Discharge status: Home / Self Care | Payer: Managed Care, Other (non HMO) | Attending: Family Medicine | Admitting: Family Medicine

## 2019-02-04 ENCOUNTER — Other Ambulatory Visit: Payer: Self-pay

## 2019-02-04 DIAGNOSIS — M779 Enthesopathy, unspecified: Secondary | ICD-10-CM

## 2019-02-04 MED ORDER — PREDNISONE 10 MG (21) PO TBPK: ORAL_TABLET | ORAL | 0 refills | Status: DC

## 2019-02-04 NOTE — ED Notes (Signed)
Patient will get elbow sleeve from drug store

## 2019-02-04 NOTE — ED Triage Notes (Addendum)
Patient reports couple week history of progressively worsening left elbow pain, states woke up yesterday with severe pain. Has ortho appt a week from tomorrow. No injury. CMS intact distally, ROM intact. Took flexeril rx yesterday to see if it wont help, denies any relief with flexeril.

## 2019-02-04 NOTE — Discharge Instructions (Addendum)
Treating you for tendinitis Take the prednisone as prescribed Purchase an elbow sleeve for comfort You can ice the area Follow-up with your orthopedic as planned

## 2019-02-04 NOTE — ED Provider Notes (Signed)
[facility]    CSN: [id number] Arrival date & time: 02/04/19  1240      History   Chief Complaint Chief Complaint  Patient presents with  . Elbow Pain    HPI Katelyn Cooper is a 63 y.o. female.   Patient is a 63 year old female with past medical history of heart failure, hypertension, anxiety, diabetes type 2, CAD, GERD.  She presents today with approximately 2 weeks of left elbow pain.  The pain is been constant, waxing and waning.  The pain worsened when she woke up yesterday.  She took Flexeril without any relief of her pain.  She also took some time without much relief.  Has not orthopedic appointment scheduled for next week.  Denies any injuries to the elbow, falls.  No numbness, tingling or weakness in the arm.  No chest pain or shortness of breath.  No erythema or swelling.  ROS per HPI      Past Medical History:  Diagnosis Date  . (HFpEF) heart failure with preserved ejection fraction ([place])    a. 12/2015 Echo: EF 55-60%, nl RV size/fxn, no significant valvular abnormalities; b. 10/2017 Echo: EF 50-55%, antsept, ant HK. Gr2 DD. Mild MR. Nl RV fxn. PASP 40mmHg.  [name] Anxiety   . CHF (congestive heart failure) ([place])   . Depression   . Diabetes type 2, controlled ([place])    diet-controlled  . Esophageal stricture   . GERD (gastroesophageal reflux disease)   . Hiatal hernia   . History of chicken pox   . Hyperlipidemia   . Hypertension   . Internal hemorrhoids   . Non-obstructive CAD (coronary artery disease)    a. 12/2015 MV: apical ant and apical reversible defect. EF 55-65%; b. 01/2016 Cath: LM nl, LAD nl, SP1 40ost, LCX nl, OM1 30, RCA 30p/m, EDP 15-29mmHg; c. 10/2017 Cath: LM nl, LAD nl, SP1 30ost, LCX nl, OM1 30, RCA 30p/m. EF 55%.  . Obstructive sleep apnea   . PAD (peripheral artery disease) ([place])    a. 1990's s/p prior LCIA stenting; b. 12/2015 ABI: R 1.05, L 1.03.  . Panic disorder     Patient Active Problem List   Diagnosis Date Noted  . Chronic  diastolic heart failure ([place]) 11/15/2017  . HTN (hypertension) 11/15/2017  . Obstructive sleep apnea 11/15/2017  . NICM (nonischemic cardiomyopathy) ([place]) 11/13/2017  . Coronary artery disease involving native coronary artery of native heart without angina pectoris 11/13/2017  . Menopausal symptom 06/13/2017  . Osteopenia 06/13/2017  . Uterine leiomyoma 06/13/2017  . Peripheral vascular disease ([place]) 09/19/2016  . Hyperlipidemia LDL goal <70 09/19/2016  . Coronary atherosclerosis 09/05/2016  . Abnormal stress test 01/12/2016  . Pleuritic chest pain 07/28/2015  . Duodenitis 01/05/2015  . Dermatitis, nummular 01/05/2015  . Allergic rhinitis 12/23/2014  . Anxiety 12/23/2014  . Ataxia 12/23/2014  . Back ache 12/23/2014  . Cervical muscle strain 12/23/2014  . Depression 12/23/2014  . Eczema 12/23/2014  . GERD (gastroesophageal reflux disease) 12/23/2014  . History of anemia 12/23/2014  . Pure hypercholesterolemia 12/23/2014  . Episodic mood disorder ([place]) 12/23/2014  . Nephropyelitis 12/23/2014  . Situational stress 12/23/2014  . Tremor 12/23/2014  . Vitamin D deficiency 11/03/2013  . Diabetes mellitus type 2, controlled ([place]) 08/13/2011  . Cough 05/10/2011  . Chest pain 05/10/2011  . Tobacco abuse 05/10/2011    Past Surgical History:  Procedure Laterality Date  . [place]   stent placement; ? left iliac artery intervention  .  APPENDECTOMY  1998  . BASAL CELL CARCINOMA EXCISION    . CARDIAC CATHETERIZATION N/A 01/12/2016   Procedure: Left Heart Cath and Coronary Angiography;  Surgeon: Nelva Bush, MD;  Location: [facility];  Service: Cardiovascular;  Laterality: N/A;  . FOOT SURGERY     Right  . pap smear  03/2010   Done by [name], normal  . REFRACTIVE SURGERY     right  . RIB RESECTION  [id number]  . RIGHT/LEFT HEART CATH AND CORONARY ANGIOGRAPHY N/A 11/08/2017   Procedure: RIGHT/LEFT HEART CATH AND CORONARY ANGIOGRAPHY;  Surgeon: Wellington Hampshire, MD;  Location: [facility];  Service: Cardiovascular;  Laterality: N/A;  . SHOULDER SURGERY  2005   left  . UPPER GI ENDOSCOPY  09/19/2011   Stricture at GE junction, dilated. Mild gastritis and duodenitis. Small hiatal hernia    OB History   No obstetric history on file.      Home Medications    Prior to Admission medications   Medication Sig Start Date End Date Taking? Authorizing Provider  albuterol (PROAIR HFA) 108 (90 Base) MCG/ACT inhaler Inhale 2 puffs into the lungs every 4 (four) hours as needed for wheezing or shortness of breath. 06/20/17   Flora Lipps, MD  aspirin EC 81 MG tablet Take 1 tablet (81 mg total) by mouth daily. 12/12/15   End, Harrell Gave, MD  atorvastatin (LIPITOR) 40 MG tablet TAKE 1 TABLET BY MOUTH (40MG  TOTAL) DAILY 11/04/18   End, Harrell Gave, MD  Cholecalciferol (VITAMIN D3) 5000 units TABS Take 1 tablet by mouth daily.  04/09/16   [provider]  cyclobenzaprine (FLEXERIL) 5 MG tablet Take 1 tablet (5 mg total) by mouth 3 (three) times daily as needed for muscle spasms. 10/29/18   Mar Daring, PA-C  furosemide (LASIX) 20 MG tablet TAKE 1 TABLET BY MOUTH DAILY 12/02/18   End, Harrell Gave, MD  glucose blood test strip ONETOUCH ULTRA MINI STRIPS AND LANCETS. Use one daily to check blood sugar. 11/18/17   [name], MD  ibuprofen (ADVIL) 600 MG tablet Take 1 tablet (600 mg total) by mouth every 8 (eight) hours as needed. 10/29/18   Mar Daring, PA-C  KLOR-CON M20 20 MEQ tablet TAKE 2 TABLETS TWICE A DAY 01/08/19   Rise Mu, PA-C  lansoprazole (PREVACID) 15 MG capsule Take 15 mg by mouth daily at 6 (six) AM.    [provider]  metFORMIN (GLUCOPHAGE-XR) 500 MG 24 hr tablet TAKE 1 TABLET BY MOUTH EVERY EVENING 10/27/18   [name], MD  metoprolol tartrate (LOPRESSOR) 25 MG tablet Take 0.5 tablets (12.5 mg total) by mouth 2 (two) times daily. 01/02/18   [name], MD  predniSONE (STERAPRED  UNI-PAK 21 TAB) 10 MG (21) TBPK tablet 6 tabs for 1 day, then 5 tabs for 1 das, then 4 tabs for 1 day, then 3 tabs for 1 day, 2 tabs for 1 day, then 1 tab for 1 day 02/04/19   Loura Halt A, NP  QUEtiapine (SEROQUEL) 100 MG tablet Take 1 tablet (100 mg total) by mouth at bedtime. 11/17/18   Rainey Pines, MD  venlafaxine XR (EFFEXOR-XR) 75 MG 24 hr capsule Take 1 capsule (75 mg total) by mouth daily with breakfast. 11/17/18   Rainey Pines, MD  zaleplon (SONATA) 10 MG capsule Take 1 capsule (10 mg total) by mouth at bedtime as needed for sleep. 11/17/18   Rainey Pines, MD    Family  History Family History  Problem Relation Age of Onset  . Lung cancer Mother        was a smoker  . Emphysema Mother   . Cancer Mother        Lung  . Alcohol abuse Mother   . Depression Mother   . Heart disease Mother   . Drug abuse Sister   . Breast cancer Sister   . Emphysema Sister   . Bipolar disorder Sister   . Alcohol abuse Other   . Depression Other   . Bipolar disorder Other   . Heart disease Other   . Vision loss Other   . Alcohol abuse Father   . Mood Disorder Father   . Heart disease Maternal Grandmother   . Stroke Maternal Grandmother   . Esophageal cancer Maternal Aunt   . Colon cancer Neg Hx     Social History Social History   Tobacco Use  . Smoking status: Former Smoker    Packs/day: 1.00    Years: 40.00    Pack years: 40.00    Types: Cigarettes    Start date: 03/01/1975    Quit date: 08/11/2017    Years since quitting: 1.4  . Smokeless tobacco: Never Used  Substance Use Topics  . Alcohol use: No    Alcohol/week: 0.0 standard drinks  . Drug use: No     Allergies   Lovastatin, Paroxetine hcl, and Lamotrigine   Review of Systems Review of Systems   Physical Exam Triage Vital Signs ED Triage Vitals  Enc Vitals Group     BP 02/04/19 1257 112/69     Pulse Rate 02/04/19 1257 96     Resp 02/04/19 1257 20     Temp 02/04/19 1257 98.9 F (37.2 C)     Temp src --       SpO2 02/04/19 1257 97 %     Weight --      Height --      Head Circumference --      Peak Flow --      Pain Score 02/04/19 1241 8     Pain Loc --      Pain Edu? --      Excl. in [place]? --    No data found.  Updated Vital Signs BP 112/69 (BP Location: Right Arm)   Pulse 96   Temp 98.9 F (37.2 C)   Resp 20   SpO2 97%   Visual Acuity Right Eye Distance:   Left Eye Distance:   Bilateral Distance:    Right Eye Near:   Left Eye Near:    Bilateral Near:      Physical Exam Vitals signs and nursing note reviewed.  Constitutional:      General: She is not in acute distress.    Appearance: Normal appearance. She is not ill-appearing, toxic-appearing or diaphoretic.  HENT:     Head: Normocephalic.     Nose: Nose normal.     Mouth/Throat:     Pharynx: Oropharynx is clear.  Eyes:     Conjunctiva/sclera: Conjunctivae normal.  Neck:     Musculoskeletal: Normal range of motion.  Pulmonary:     Effort: Pulmonary effort is normal.  Musculoskeletal: Normal range of motion.        General: Tenderness present. No swelling or deformity.     Left elbow: She exhibits normal range of motion, no swelling, no effusion, no deformity and no laceration. Tenderness found. Lateral epicondyle and olecranon process tenderness noted.  Comments: Strength 5/5 CMS intact  Skin:    General: Skin is warm and dry.     Findings: No rash.  Neurological:     Mental Status: She is alert.  Psychiatric:        Mood and Affect: Mood normal.     UC Treatments / Results  Labs (all labs ordered are listed, but only abnormal results are displayed) Labs Reviewed - No data to display  EKG   Radiology No results found.  Procedures Procedures (including critical care time)  Medications Ordered in UC Medications - No data to display  Initial Impression / Assessment and Plan / UC Course  I have reviewed the triage vital signs and the nursing notes.  Pertinent labs & imaging results that were  available during my care of the patient were reviewed by me and considered in my medical decision making (see chart for details).     Tendonitis- treating with prednisone taper, she has had this before and tolerated well Recommended elbow sleeve for comfort Rest, ice, elevate and follow-up with Ortho as planned. Final Clinical Impressions(s) / UC Diagnoses   Final diagnoses:  Tendonitis     Discharge Instructions     Treating you for tendinitis Take the prednisone as prescribed Purchase an elbow sleeve for comfort You can ice the area Follow-up with your orthopedic as planned    ED Prescriptions    Medication Sig Dispense Auth. Provider   predniSONE (STERAPRED UNI-PAK 21 TAB) 10 MG (21) TBPK tablet 6 tabs for 1 day, then 5 tabs for 1 das, then 4 tabs for 1 day, then 3 tabs for 1 day, 2 tabs for 1 day, then 1 tab for 1 day 21 tablet Jarelle Ates A, NP     PDMP not reviewed this encounter.   Orvan July, NP 02/04/19 1452

## 2019-02-06 ENCOUNTER — Ambulatory Visit: Payer: Self-pay | Admitting: Family Medicine

## 2019-02-09 ENCOUNTER — Other Ambulatory Visit: Payer: Self-pay

## 2019-02-09 ENCOUNTER — Ambulatory Visit: Payer: 59 | Admitting: Psychiatry

## 2019-02-09 ENCOUNTER — Encounter: Payer: Self-pay | Admitting: Psychiatry

## 2019-02-09 ENCOUNTER — Ambulatory Visit (INDEPENDENT_AMBULATORY_CARE_PROVIDER_SITE_OTHER): Payer: 59 | Admitting: Psychiatry

## 2019-02-09 DIAGNOSIS — F3170 Bipolar disorder, currently in remission, most recent episode unspecified: Secondary | ICD-10-CM | POA: Insufficient documentation

## 2019-02-09 DIAGNOSIS — F4310 Post-traumatic stress disorder, unspecified: Secondary | ICD-10-CM | POA: Diagnosis not present

## 2019-02-09 MED ORDER — QUETIAPINE FUMARATE 100 MG PO TABS: 100.0000 mg | ORAL_TABLET | Freq: Every day | ORAL | 1 refills | Status: DC

## 2019-02-09 MED ORDER — VENLAFAXINE HCL ER 75 MG PO CP24: 75.0000 mg | ORAL_CAPSULE | Freq: Every day | ORAL | 1 refills | Status: DC

## 2019-02-09 MED ORDER — ZALEPLON 10 MG PO CAPS: 10.0000 mg | ORAL_CAPSULE | Freq: Every evening | ORAL | 2 refills | Status: DC | PRN

## 2019-02-09 NOTE — Progress Notes (Signed)
[facility] MD OP Progress Note  I connected with  Katelyn Cooper on 02/09/19 by a video enabled telemedicine application and verified that I am speaking with the correct person using two identifiers.   I discussed the limitations of evaluation and management by telemedicine. The patient expressed understanding and agreed to proceed.    02/09/2019 11:36 AM Katelyn Cooper  MRN:  [id number]  Chief Complaint:  " I am doing pretty good."  HPI: Pt reported doing well on her current regimen. She reported sleeping well and described her mood as being stable. She denied any issues or concerns. She has started to go walk for 2 mile walks 3 times per week with her friend which she finds helpful as well.  Visit Diagnosis:    ICD-10-CM   1. Bipolar disorder in remission ([place])  F31.70   2. Post traumatic stress disorder (PTSD)  F43.10     Past Psychiatric History: PTSD, Bipolar d/o  Past Medical History:  Past Medical History:  Diagnosis Date  . (HFpEF) heart failure with preserved ejection fraction ([place])    a. 12/2015 Echo: EF 55-60%, nl RV size/fxn, no significant valvular abnormalities; b. 10/2017 Echo: EF 50-55%, antsept, ant HK. Gr2 DD. Mild MR. Nl RV fxn. PASP 55mmHg.  [name] Anxiety   . CHF (congestive heart failure) ([place])   . Depression   . Diabetes type 2, controlled ([place])    diet-controlled  . Esophageal stricture   . GERD (gastroesophageal reflux disease)   . Hiatal hernia   . History of chicken pox   . Hyperlipidemia   . Hypertension   . Internal hemorrhoids   . Non-obstructive CAD (coronary artery disease)    a. 12/2015 MV: apical ant and apical reversible defect. EF 55-65%; b. 01/2016 Cath: LM nl, LAD nl, SP1 40ost, LCX nl, OM1 30, RCA 30p/m, EDP 15-66mmHg; c. 10/2017 Cath: LM nl, LAD nl, SP1 30ost, LCX nl, OM1 30, RCA 30p/m. EF 55%.  . Obstructive sleep apnea   . PAD (peripheral artery disease) ([place])    a. 1990's s/p prior LCIA stenting; b. 12/2015 ABI: R 1.05, L 1.03.  . Panic disorder      Past Surgical History:  Procedure Laterality Date  . [place]   stent placement; ? left iliac artery intervention  . APPENDECTOMY  1998  . BASAL CELL CARCINOMA EXCISION    . CARDIAC CATHETERIZATION N/A 01/12/2016   Procedure: Left Heart Cath and Coronary Angiography;  Surgeon: Nelva Bush, MD;  Location: [facility];  Service: Cardiovascular;  Laterality: N/A;  . FOOT SURGERY     Right  . pap smear  03/2010   Done by [name], normal  . REFRACTIVE SURGERY     right  . RIB RESECTION  [id number]  . RIGHT/LEFT HEART CATH AND CORONARY ANGIOGRAPHY N/A 11/08/2017   Procedure: RIGHT/LEFT HEART CATH AND CORONARY ANGIOGRAPHY;  Surgeon: Wellington Hampshire, MD;  Location: [facility];  Service: Cardiovascular;  Laterality: N/A;  . SHOULDER SURGERY  2005   left  . UPPER GI ENDOSCOPY  09/19/2011   Stricture at GE junction, dilated. Mild gastritis and duodenitis. Small hiatal hernia    Family Psychiatric History: sister- bipolar d/o,mom - depression  Family History:  Family History  Problem Relation Age of Onset  . Lung cancer Mother        was a smoker  . Emphysema Mother   . Cancer Mother  Lung  . Alcohol abuse Mother   . Depression Mother   . Heart disease Mother   . Drug abuse Sister   . Breast cancer Sister   . Emphysema Sister   . Bipolar disorder Sister   . Alcohol abuse Other   . Depression Other   . Bipolar disorder Other   . Heart disease Other   . Vision loss Other   . Alcohol abuse Father   . Mood Disorder Father   . Heart disease Maternal Grandmother   . Stroke Maternal Grandmother   . Esophageal cancer Maternal Aunt   . Colon cancer Neg Hx     Social History:  Social History   Socioeconomic History  . Marital status: Married    Spouse name: mark  . Number of children: 0  . Years of education: Not on file  . Highest education level: High school graduate  Occupational History  . Occupation: computer-retired     Employer: [employer]  Social Needs  . Financial resource strain: Not hard at all  . Food insecurity    Worry: Never true    Inability: Never true  . Transportation needs    Medical: No    Non-medical: No  Tobacco Use  . Smoking status: Former Smoker    Packs/day: 1.00    Years: 40.00    Pack years: 40.00    Types: Cigarettes    Start date: 03/01/1975    Quit date: 08/11/2017    Years since quitting: 1.4  . Smokeless tobacco: Never Used  Substance and Sexual Activity  . Alcohol use: No    Alcohol/week: 0.0 standard drinks  . Drug use: No  . Sexual activity: Not Currently  Lifestyle  . Physical activity    Days per week: 0 days    Minutes per session: 0 min  . Stress: Not at all  Relationships  . Social Herbalist on phone: Once a week    Gets together: Never    Attends religious service: Never    Active member of club or organization: No    Attends meetings of clubs or organizations: Never    Relationship status: Married  Other Topics Concern  . Not on file  Social History Narrative  . Not on file    Allergies:  Allergies  Allergen Reactions  . Lovastatin     depression  . Paroxetine Hcl     itching  . Lamotrigine Rash    Metabolic Disorder Labs: Lab Results  Component Value Date   HGBA1C 7.0 (A) 09/23/2018   No results found for: PROLACTIN Lab Results  Component Value Date   CHOL 137 03/21/2018   TRIG 140 03/21/2018   HDL 50 03/21/2018   CHOLHDL 2.7 03/21/2018   LDLCALC 59 03/21/2018   LDLCALC 73 09/21/2016   Lab Results  Component Value Date   TSH 1.26 12/10/2013    Therapeutic Level Labs: No results found for: LITHIUM No results found for: VALPROATE No components found for:  CBMZ  Current Medications: Current Outpatient Medications  Medication Sig Dispense Refill  . albuterol (PROAIR HFA) 108 (90 Base) MCG/ACT inhaler Inhale 2 puffs into the lungs every 4 (four) hours as needed for wheezing or shortness of breath. 1 Inhaler  2  . aspirin EC 81 MG tablet Take 1 tablet (81 mg total) by mouth daily.    [name] atorvastatin (LIPITOR) 40 MG tablet TAKE 1 TABLET BY MOUTH (40MG  TOTAL) DAILY 90 tablet 3  .  Cholecalciferol (VITAMIN D3) 5000 units TABS Take 1 tablet by mouth daily.   0  . cyclobenzaprine (FLEXERIL) 5 MG tablet Take 1 tablet (5 mg total) by mouth 3 (three) times daily as needed for muscle spasms. 30 tablet 0  . furosemide (LASIX) 20 MG tablet TAKE 1 TABLET BY MOUTH DAILY 90 tablet 0  . glucose blood test strip ONETOUCH ULTRA MINI STRIPS AND LANCETS. Use one daily to check blood sugar. 100 each 3  . ibuprofen (ADVIL) 600 MG tablet Take 1 tablet (600 mg total) by mouth every 8 (eight) hours as needed. 30 tablet 0  . KLOR-CON M20 20 MEQ tablet TAKE 2 TABLETS TWICE A DAY 120 tablet 2  . lansoprazole (PREVACID) 15 MG capsule Take 15 mg by mouth daily at 6 (six) AM.    . metFORMIN (GLUCOPHAGE-XR) 500 MG 24 hr tablet TAKE 1 TABLET BY MOUTH EVERY EVENING 90 tablet 3  . metoprolol tartrate (LOPRESSOR) 25 MG tablet Take 0.5 tablets (12.5 mg total) by mouth 2 (two) times daily. 180 tablet 4  . predniSONE (STERAPRED UNI-PAK 21 TAB) 10 MG (21) TBPK tablet 6 tabs for 1 day, then 5 tabs for 1 das, then 4 tabs for 1 day, then 3 tabs for 1 day, 2 tabs for 1 day, then 1 tab for 1 day 21 tablet 0  . QUEtiapine (SEROQUEL) 100 MG tablet Take 1 tablet (100 mg total) by mouth at bedtime. 90 tablet 1  . venlafaxine XR (EFFEXOR-XR) 75 MG 24 hr capsule Take 1 capsule (75 mg total) by mouth daily with breakfast. 90 capsule 1  . zaleplon (SONATA) 10 MG capsule Take 1 capsule (10 mg total) by mouth at bedtime as needed for sleep. 30 capsule 2   No current facility-administered medications for this visit.      Musculoskeletal: Strength & Muscle Tone: unable to assess due to telemed visit Gait & Station: unable to assess due to telemed visit Patient leans: unable to assess due to telemed visit   Psychiatric Specialty Exam: ROS  There were  no vitals taken for this visit.There is no height or weight on file to calculate BMI.  General Appearance: Fairly Groomed  Eye Contact:  Good  Speech:  Clear and Coherent and Normal Rate  Volume:  Normal  Mood:  Euphoric and Euthymic  Affect:  Congruent  Thought Process:  Goal Directed, Linear and Descriptions of Associations: Intact  Orientation:  Full (Time, Place, and Person)  Thought Content: Logical   Suicidal Thoughts:  No  Homicidal Thoughts:  No  Memory:  Recent;   Good Remote;   Good  Judgement:  Fair  Insight:  Fair  Psychomotor Activity:  Normal  Concentration:  Concentration: Good and Attention Span: Good  Recall:  Good  Fund of Knowledge: Good  Language: Good  Akathisia:  Negative  Handed:  Right  AIMS (if indicated): not tested  Assets:  Communication Skills Desire for Improvement Financial Resources/Insurance Housing Leisure Time Social Support Talents/Skills Transportation  ADL's:  Intact  Cognition: WNL  Sleep:  Good   Screenings: AIMS     Office Visit from 03/10/2018 in [facility] Total Score  0    PHQ2-9     Office Visit from 12/31/2017 in [facility] Office Visit from 11/15/2017 in [facility] Office Visit from 09/20/2016 in [facility] [facility]  PHQ-2 Total Score  0  0  0  PHQ-9 Total  Score  -  -  3       Assessment and Plan: 63 y/o female with hx of Bipolar d/o and PTSD now doing well on her med regimen.  1. Bipolar disorder in remission (H[facility])  - venlafaxine XR (EFFEXOR-XR) 75 MG 24 hr capsule; Take 1 capsule (75 mg total) by mouth daily with breakfast.  Dispense: 90 capsule; Refill: 1 - QUEtiapine (SEROQUEL) 100 MG tablet; Take 1 tablet (100 mg total) by mouth at bedtime.  Dispense: 90 tablet; Refill: 1 - Continue Sonata 10 mg HS  2. Post traumatic stress disorder (PTSD)  - venlafaxine XR (EFFEXOR-XR) 75 MG 24 hr  capsule; Take 1 capsule (75 mg total) by mouth daily with breakfast.  Dispense: 90 capsule; Refill: 1  F/up in 3 months.  N[place] Crane, MD 02/09/2019, 11:36 AM

## 2019-02-12 ENCOUNTER — Other Ambulatory Visit: Payer: Self-pay

## 2019-02-12 ENCOUNTER — Ambulatory Visit (INDEPENDENT_AMBULATORY_CARE_PROVIDER_SITE_OTHER): Payer: Managed Care, Other (non HMO)

## 2019-02-12 ENCOUNTER — Encounter: Payer: Self-pay | Admitting: Orthopedic Surgery

## 2019-02-12 ENCOUNTER — Ambulatory Visit (INDEPENDENT_AMBULATORY_CARE_PROVIDER_SITE_OTHER): Payer: Managed Care, Other (non HMO) | Admitting: Orthopedic Surgery

## 2019-02-12 DIAGNOSIS — M25522 Pain in left elbow: Secondary | ICD-10-CM

## 2019-02-12 NOTE — Progress Notes (Signed)
Office Visit Note   Patient: Katelyn Cooper           Date of Birth: 10-31-1955           MRN: [id number] Visit Date: 02/12/2019 Requested by: [name], MD [address] [place],  [place] [address] PCP: [name], MD  Subjective: Chief Complaint  Patient presents with  . Left Elbow - Pain    HPI: Katelyn Cooper is a 63 y.o. female who presents to the office complaining of left elbow pain.  Patient notes pain for about a month.  Patient notes insidious onset without any history of injury.  She reports pain in the posterior medial elbow with acute worsening 10 days ago when she became very tender to gentle touch.  Patient subsequently went to urgent care where she received a steroid Dosepak that provided some relief but did not resolve her pain.  She denies any swelling, fevers, chills, waking with pain, shoulder pain, wrist pain.  She states that this is never happened before.  She takes occasional Tylenol for pain as she cannot take NSAIDs due to GI irritation..                ROS:  All systems reviewed are negative as they relate to the chief complaint within the history of present illness.  Patient denies fevers or chills.  Assessment & Plan: Visit Diagnoses:  1. Pain in left elbow     Plan: Patient is a 63 year old female who presents complaining of left elbow pain over the past month.  It acutely worsened 10 days ago and a steroid Dosepak provided some relief but did not resolve her pain.  On exam she has tenderness over the skin of the posterior elbow but no tenderness over discernible specific structures of the elbow.  X-rays taken today show no evidence of significant degenerative changes or bony pathology.  Impression is superficial nerve irritation.  Recommended that patient try topical Voltaren daily over the past month.  Patient will follow up in 4 weeks for clinical recheck.  Structurally the elbow looks intact and radiographs are normal.  Do not  anticipate this is operative problem.  Do not think this is gout or pseudogout.  Onset was relatively acute.  Consider further imaging in 4 weeks if not resolved  Follow-Up Instructions: No follow-ups on file.   Orders:  Orders Placed This Encounter  Procedures  . XR Elbow 2 Views Left   No orders of the defined types were placed in this encounter.     Procedures: No procedures performed   Clinical Data: No additional findings.  Objective: Vital Signs: There were no vitals taken for this visit.  Physical Exam:  Constitutional: Patient appears well-developed HEENT:  Head: Normocephalic Eyes:EOM are normal Neck: Normal range of motion Cardiovascular: Normal rate Pulmonary/chest: Effort normal Neurologic: Patient is alert Skin: Skin is warm Psychiatric: Patient has normal mood and affect  Ortho Exam:  Left elbow exam Tenderness to light touch over the posterior elbow.  Mild TTP over the medial epicondyle and tricep tendon.  Mild TTP with squeeze of the tricep muscle belly.  No pain with resisted tricep strength testing.  No pain with resisted bicep strength testing.  No pain over the radial head, bicep tendon, lateral epicondyle.  No subluxation of the ulnar nerve with passive range of motion of the elbow.  No pain with passive flexion, extension, pronation, supination of the elbow.  Mild pain with resisted  pronation of the left elbow.  Specialty Comments:  No specialty comments available.  Imaging: No results found.   PMFS History: Patient Active Problem List   Diagnosis Date Noted  . Bipolar disorder in remission ([place]) 02/09/2019  . Post traumatic stress disorder (PTSD) 02/09/2019  . Chronic diastolic heart failure ([place]) 11/15/2017  . HTN (hypertension) 11/15/2017  . Obstructive sleep apnea 11/15/2017  . NICM (nonischemic cardiomyopathy) ([place]) 11/13/2017  . Coronary artery disease involving native coronary artery of native heart without angina pectoris 11/13/2017   . Menopausal symptom 06/13/2017  . Osteopenia 06/13/2017  . Uterine leiomyoma 06/13/2017  . Peripheral vascular disease ([place]) 09/19/2016  . Hyperlipidemia LDL goal <70 09/19/2016  . Coronary atherosclerosis 09/05/2016  . Abnormal stress test 01/12/2016  . Pleuritic chest pain 07/28/2015  . Duodenitis 01/05/2015  . Dermatitis, nummular 01/05/2015  . Allergic rhinitis 12/23/2014  . Anxiety 12/23/2014  . Ataxia 12/23/2014  . Back ache 12/23/2014  . Cervical muscle strain 12/23/2014  . Depression 12/23/2014  . Eczema 12/23/2014  . GERD (gastroesophageal reflux disease) 12/23/2014  . History of anemia 12/23/2014  . Pure hypercholesterolemia 12/23/2014  . Episodic mood disorder ([place]) 12/23/2014  . Nephropyelitis 12/23/2014  . Situational stress 12/23/2014  . Tremor 12/23/2014  . Vitamin D deficiency 11/03/2013  . Diabetes mellitus type 2, controlled ([place]) 08/13/2011  . Cough 05/10/2011  . Chest pain 05/10/2011  . Tobacco abuse 05/10/2011   Past Medical History:  Diagnosis Date  . (HFpEF) heart failure with preserved ejection fraction ([place])    a. 12/2015 Echo: EF 55-60%, nl RV size/fxn, no significant valvular abnormalities; b. 10/2017 Echo: EF 50-55%, antsept, ant HK. Gr2 DD. Mild MR. Nl RV fxn. PASP 42mmHg.  [name] Anxiety   . CHF (congestive heart failure) ([place])   . Depression   . Diabetes type 2, controlled ([place])    diet-controlled  . Esophageal stricture   . GERD (gastroesophageal reflux disease)   . Hiatal hernia   . History of chicken pox   . Hyperlipidemia   . Hypertension   . Internal hemorrhoids   . Non-obstructive CAD (coronary artery disease)    a. 12/2015 MV: apical ant and apical reversible defect. EF 55-65%; b. 01/2016 Cath: LM nl, LAD nl, SP1 40ost, LCX nl, OM1 30, RCA 30p/m, EDP 15-7mmHg; c. 10/2017 Cath: LM nl, LAD nl, SP1 30ost, LCX nl, OM1 30, RCA 30p/m. EF 55%.  . Obstructive sleep apnea   . PAD (peripheral artery disease) ([place])    a. 1990's s/p prior LCIA  stenting; b. 12/2015 ABI: R 1.05, L 1.03.  . Panic disorder     Family History  Problem Relation Age of Onset  . Lung cancer Mother        was a smoker  . Emphysema Mother   . Cancer Mother        Lung  . Alcohol abuse Mother   . Depression Mother   . Heart disease Mother   . Drug abuse Sister   . Breast cancer Sister   . Emphysema Sister   . Bipolar disorder Sister   . Alcohol abuse Other   . Depression Other   . Bipolar disorder Other   . Heart disease Other   . Vision loss Other   . Alcohol abuse Father   . Mood Disorder Father   . Heart disease Maternal Grandmother   . Stroke Maternal Grandmother   . Esophageal cancer Maternal Aunt   . Colon cancer Neg Hx  Past Surgical History:  Procedure Laterality Date  . [place]   stent placement; ? left iliac artery intervention  . APPENDECTOMY  1998  . BASAL CELL CARCINOMA EXCISION    . CARDIAC CATHETERIZATION N/A 01/12/2016   Procedure: Left Heart Cath and Coronary Angiography;  Surgeon: Nelva Bush, MD;  Location: [facility];  Service: Cardiovascular;  Laterality: N/A;  . FOOT SURGERY     Right  . pap smear  03/2010   Done by [name], normal  . REFRACTIVE SURGERY     right  . RIB RESECTION  [id number]  . RIGHT/LEFT HEART CATH AND CORONARY ANGIOGRAPHY N/A 11/08/2017   Procedure: RIGHT/LEFT HEART CATH AND CORONARY ANGIOGRAPHY;  Surgeon: Wellington Hampshire, MD;  Location: [facility];  Service: Cardiovascular;  Laterality: N/A;  . SHOULDER SURGERY  2005   left  . UPPER GI ENDOSCOPY  09/19/2011   Stricture at GE junction, dilated. Mild gastritis and duodenitis. Small hiatal hernia   Social History   Occupational History  . Occupation: computer-retired    Employer: [employer]  Tobacco Use  . Smoking status: Former Smoker    Packs/day: 1.00    Years: 40.00    Pack years: 40.00    Types: Cigarettes    Start date: 03/01/1975    Quit date: 08/11/2017    Years since quitting: 1.5   . Smokeless tobacco: Never Used  Substance and Sexual Activity  . Alcohol use: No    Alcohol/week: 0.0 standard drinks  . Drug use: No  . Sexual activity: Not Currently

## 2019-03-02 ENCOUNTER — Other Ambulatory Visit: Payer: Self-pay | Admitting: Internal Medicine

## 2019-03-04 ENCOUNTER — Encounter: Payer: Self-pay | Admitting: Internal Medicine

## 2019-03-04 ENCOUNTER — Ambulatory Visit: Payer: Managed Care, Other (non HMO) | Admitting: Family Medicine

## 2019-03-04 ENCOUNTER — Ambulatory Visit (INDEPENDENT_AMBULATORY_CARE_PROVIDER_SITE_OTHER): Payer: Managed Care, Other (non HMO) | Admitting: Internal Medicine

## 2019-03-04 DIAGNOSIS — R918 Other nonspecific abnormal finding of lung field: Secondary | ICD-10-CM | POA: Diagnosis not present

## 2019-03-04 DIAGNOSIS — J449 Chronic obstructive pulmonary disease, unspecified: Secondary | ICD-10-CM | POA: Diagnosis not present

## 2019-03-04 DIAGNOSIS — R911 Solitary pulmonary nodule: Secondary | ICD-10-CM | POA: Diagnosis not present

## 2019-03-04 DIAGNOSIS — G4733 Obstructive sleep apnea (adult) (pediatric): Secondary | ICD-10-CM

## 2019-03-04 NOTE — Progress Notes (Signed)
[facility] Consultation      I connected with the patient by telephone enabled telemedicine visit and verified that I am speaking with the correct person using two identifiers.    I discussed the limitations, risks, security and privacy concerns of performing an evaluation and management service by telemedicine and the availability of in-person appointments. I also discussed with the patient that there may be Cooper patient responsible charge related to this service. The patient expressed understanding and agreed to proceed.  PATIENT AGREES AND CONFIRMS -YES   Other persons participating in the visit and their role in the encounter: Patient, nursing  This visit type was conducted due to national recommendations for restrictions regarding the COVID-19 Pandemic (e.g. social distancing).  This format is felt to be most appropriate for this patient at this time.  All issues noted in this document were discussed and addressed.        Date: 03/04/2019,   MRN# [id number] Katelyn Cooper [facility] 10/26/1955     Admission                  Current  Katelyn Cooper is Cooper 63 y.o. old female seen in consultation for cough, pleuritic chest pain. Previous BQ patient   PFT FINDINGS PFT's 08/2015 reviewed with patient ratio 80% Fev1 2.2 L 87% FVC 2.6L 83%  Ct chest 08/30/15 Left lung Nodule approx 6 MM increased from 2-3 MM from 2013.    CT chest 12/2015 Left Lung nodule approx 6 MM-no change from previous CT scans   CT chest 09/05/16 There is Cooper right lower lobe small opacity probable mucus plugging mucoid impaction  CT chest 3.7.19 The left lower lobe nodule has not increased in size significantly from previous CAT scan  CT chest 8/19 Edema and effusions Left Nodule stable  findings reveiwed with patient   ECHO 8/19 left ventricular filling pattern, with   concomitant abnormal relaxation and increased filling pressure   (grade 2 diastolic dysfunction).  CT che  CT chest  10/2017 Edema Cooper dn effusions CHIEF COMPLAINT:   COPD follow-up  Follow-up [facility]  HISTORY OF PRESENT ILLNESS    COPD seems to be under control at this time Intermittent albuterol use No maintenance therapy at this time Quit tobacco 1.5 years ago  Her sleep apnea is under control with CPAP therapy Excellent compliance report AHI is down to 4.7 100% percent compliance for days 77% compliance for more than 4 hours Auto CPAP 5 to 20 cm water pressure   No  COPD exacerbation at this time No evidence of heart failure at this time No evidence or signs of infection at this time No respiratory distress No fevers, chills, nausea, vomiting, diarrhea No evidence of lower extremity edema No evidence hemoptysis     Current Medication:   Current Outpatient Medications:  .  albuterol (PROAIR HFA) 108 (90 Base) MCG/ACT inhaler, Inhale 2 puffs into the lungs every 4 (four) hours as needed for wheezing or shortness of breath., Disp: 1 Inhaler, Rfl: 2 .  aspirin EC 81 MG tablet, Take 1 tablet (81 mg total) by mouth daily., Disp: , Rfl:  .  atorvastatin (LIPITOR) 40 MG tablet, TAKE 1 TABLET BY MOUTH (40MG  TOTAL) DAILY, Disp: 90 tablet, Rfl: 3 .  Cholecalciferol (VITAMIN D3) 5000 units TABS, Take 1 tablet by mouth daily. , Disp: , Rfl: 0 .  cyclobenzaprine (FLEXERIL) 5 MG tablet, Take 1 tablet (5 mg total) by mouth 3 (three) times daily as needed  for muscle spasms., Disp: 30 tablet, Rfl: 0 .  furosemide (LASIX) 20 MG tablet, Take 1 tablet (20 mg total) by mouth daily. *NEEDS OFFICE VISIT FOR FURTHER REFILLS*, Disp: 90 tablet, Rfl: 0 .  glucose blood test strip, ONETOUCH ULTRA MINI STRIPS AND LANCETS. Use one daily to check blood sugar., Disp: 100 each, Rfl: 3 .  ibuprofen (ADVIL) 600 MG tablet, Take 1 tablet (600 mg total) by mouth every 8 (eight) hours as needed., Disp: 30 tablet, Rfl: 0 .  KLOR-CON M20 20 MEQ tablet, TAKE 2 TABLETS TWICE Cooper DAY, Disp: 120 tablet, Rfl: 2 .  lansoprazole  (PREVACID) 15 MG capsule, Take 15 mg by mouth daily at 6 (six) AM., Disp: , Rfl:  .  metFORMIN (GLUCOPHAGE-XR) 500 MG 24 hr tablet, TAKE 1 TABLET BY MOUTH EVERY EVENING, Disp: 90 tablet, Rfl: 3 .  metoprolol tartrate (LOPRESSOR) 25 MG tablet, Take 0.5 tablets (12.5 mg total) by mouth 2 (two) times daily., Disp: 180 tablet, Rfl: 4 .  predniSONE (STERAPRED UNI-PAK 21 TAB) 10 MG (21) TBPK tablet, 6 tabs for 1 day, then 5 tabs for 1 das, then 4 tabs for 1 day, then 3 tabs for 1 day, 2 tabs for 1 day, then 1 tab for 1 day, Disp: 21 tablet, Rfl: 0 .  QUEtiapine (SEROQUEL) 100 MG tablet, Take 1 tablet (100 mg total) by mouth at bedtime., Disp: 90 tablet, Rfl: 1 .  venlafaxine XR (EFFEXOR-XR) 75 MG 24 hr capsule, Take 1 capsule (75 mg total) by mouth daily with breakfast., Disp: 90 capsule, Rfl: 1 .  zaleplon (SONATA) 10 MG capsule, Take 1 capsule (10 mg total) by mouth at bedtime as needed for sleep., Disp: 30 capsule, Rfl: 2     ALLERGIES   Lovastatin, Paroxetine hcl, and Lamotrigine     REVIEW OF SYSTEMS   Review of Systems  Constitutional: Negative for chills, fever, malaise/fatigue and weight loss.  HENT: Negative for congestion.   Respiratory: Negative for cough, hemoptysis, sputum production, shortness of breath and wheezing.   Cardiovascular: Negative for chest pain, palpitations, orthopnea and leg swelling.  Gastrointestinal: Negative for nausea.  Psychiatric/Behavioral: Depression:    All other systems reviewed and are negative.      ASSESSMENT/PLAN   63 year old white female with grade 2 diastolic heart failure with underlying severe sleep apnea seems to be well-controlled at this time in the setting of COPD  Smoking cessation approximately 1.5 years ago Solitary pulmonary nodule has been stable over the last 1.5 years  COPD stable Intermittent shortness of breath and wheezing Albuterol as needed No additional inhalers needed at this time  OSA Has improved with  therapy and underlying COPD stable No acute issues at this time Patient has very good compliance report Patient uses and benefits from auto CPAP therapy  Grade 2 diastolic dysfunction Follow-up cardiology as scheduled  LEFT Lung  Nodule- Repeat CT chest PENDING  FLU SHOT UP TO DATE 10/20   COVID-19 EDUCATION: The signs and symptoms of COVID-19 were discussed with the patient and how to seek care for testing.  The importance of social distancing was discussed today. Hand Washing Techniques and avoid touching face was advised.  MEDICATION ADJUSTMENTS/LABS AND TESTS ORDERED: Albuterol as needed Avoid sick contacts Continue auto CPAP therapy CT CHEST NEEDED  CURRENT MEDICATIONS REVIEWED AT LENGTH WITH PATIENT TODAY   Patient  satisfied with Plan of action and management. All questions answered Follow up in 6 months  Total time spent 22 minutes  [name], M.D.  [facility]  Medical Director [place] Director [facility] Cardio-Pulmonary Department

## 2019-03-04 NOTE — Patient Instructions (Addendum)
MEDICATION ADJUSTMENTS/LABS AND TESTS ORDERED: Albuterol as needed Avoid sick contacts Continue auto CPAP therapy CT chest     COVID-19 COVID-19 is a respiratory infection that is caused by a virus called severe acute respiratory syndrome coronavirus 2 (SARS-CoV-2). The disease is also known as coronavirus disease or novel coronavirus. In some people, the virus may not cause any symptoms. In others, it may cause a serious infection. The infection can get worse quickly and can lead to complications, such as:  Pneumonia, or infection of the lungs.  Acute respiratory distress syndrome or ARDS. This is fluid build-up in the lungs.  Acute respiratory failure. This is a condition in which there is not enough oxygen passing from the lungs to the body.  Sepsis or septic shock. This is a serious bodily reaction to an infection.  Blood clotting problems.  Secondary infections due to bacteria or fungus. The virus that causes COVID-19 is contagious. This means that it can spread from person to person through droplets from coughs and sneezes (respiratory secretions). What are the causes? This illness is caused by a virus. You may catch the virus by:  Breathing in droplets from an infected person's cough or sneeze.  Touching something, like a table or a doorknob, that was exposed to the virus (contaminated) and then touching your mouth, nose, or eyes. What increases the risk? Risk for infection You are more likely to be infected with this virus if you:  Live in or travel to an area with a COVID-19 outbreak.  Come in contact with a sick person who recently traveled to an area with a COVID-19 outbreak.  Provide care for or live with a person who is infected with COVID-19. Risk for serious illness You are more likely to become seriously ill from the virus if you:  Are 61 years of age or older.  Have a long-term disease that lowers your body's ability to fight infection  (immunocompromised).  Live in a nursing home or long-term care facility.  Have a long-term (chronic) disease such as: ? Chronic lung disease, including chronic obstructive pulmonary disease or asthma ? Heart disease. ? Diabetes. ? Chronic kidney disease. ? Liver disease.  Are obese. What are the signs or symptoms? Symptoms of this condition can range from mild to severe. Symptoms may appear any time from 2 to 14 days after being exposed to the virus. They include:  A fever.  A cough.  Difficulty breathing.  Chills.  Muscle pains.  A sore throat.  Loss of taste or smell. Some people may also have stomach problems, such as nausea, vomiting, or diarrhea. Other people may not have any symptoms of COVID-19. How is this diagnosed? This condition may be diagnosed based on:  Your signs and symptoms, especially if: ? You live in an area with a COVID-19 outbreak. ? You recently traveled to or from an area where the virus is common. ? You provide care for or live with a person who was diagnosed with COVID-19.  A physical exam.  Lab tests, which may include: ? A nasal swab to take a sample of fluid from your nose. ? A throat swab to take a sample of fluid from your throat. ? A sample of mucus from your lungs (sputum). ? Blood tests.  Imaging tests, which may include, X-rays, CT scan, or ultrasound. How is this treated? At present, there is no medicine to treat COVID-19. Medicines that treat other diseases are being used on a trial basis to see if  they are effective against COVID-19. Your health care provider will talk with you about ways to treat your symptoms. For most people, the infection is mild and can be managed at home with rest, fluids, and over-the-counter medicines. Treatment for a serious infection usually takes places in a hospital intensive care unit (ICU). It may include one or more of the following treatments. These treatments are given until your symptoms  improve.  Receiving fluids and medicines through an IV.  Supplemental oxygen. Extra oxygen is given through a tube in the nose, a face mask, or a hood.  Positioning you to lie on your stomach (prone position). This makes it easier for oxygen to get into the lungs.  Continuous positive airway pressure (CPAP) or bi-level positive airway pressure (BPAP) machine. This treatment uses mild air pressure to keep the airways open. A tube that is connected to a motor delivers oxygen to the body.  Ventilator. This treatment moves air into and out of the lungs by using a tube that is placed in your windpipe.  Tracheostomy. This is a procedure to create a hole in the neck so that a breathing tube can be inserted.  Extracorporeal membrane oxygenation (ECMO). This procedure gives the lungs a chance to recover by taking over the functions of the heart and lungs. It supplies oxygen to the body and removes carbon dioxide. Follow these instructions at home: Lifestyle  If you are sick, stay home except to get medical care. Your health care provider will tell you how long to stay home. Call your health care provider before you go for medical care.  Rest at home as told by your health care provider.  Do not use any products that contain nicotine or tobacco, such as cigarettes, e-cigarettes, and chewing tobacco. If you need help quitting, ask your health care provider.  Return to your normal activities as told by your health care provider. Ask your health care provider what activities are safe for you. General instructions  Take over-the-counter and prescription medicines only as told by your health care provider.  Drink enough fluid to keep your urine pale yellow.  Keep all follow-up visits as told by your health care provider. This is important. How is this prevented?  There is no vaccine to help prevent COVID-19 infection. However, there are steps you can take to protect yourself and others from this  virus. To protect yourself:   Do not travel to areas where COVID-19 is a risk. The areas where COVID-19 is reported change often. To identify high-risk areas and travel restrictions, check the CDC travel website: FatFares.com.br  If you live in, or must travel to, an area where COVID-19 is a risk, take precautions to avoid infection. ? Stay away from people who are sick. ? Wash your hands often with soap and water for 20 seconds. If soap and water are not available, use an alcohol-based hand sanitizer. ? Avoid touching your mouth, face, eyes, or nose. ? Avoid going out in public, follow guidance from your state and local health authorities. ? If you must go out in public, wear a cloth face covering or face mask. ? Disinfect objects and surfaces that are frequently touched every day. This may include:  Counters and tables.  Doorknobs and light switches.  Sinks and faucets.  Electronics, such as phones, remote controls, keyboards, computers, and tablets. To protect others: If you have symptoms of COVID-19, take steps to prevent the virus from spreading to others.  If you think you  have a COVID-19 infection, contact your health care provider right away. Tell your health care team that you think you may have a COVID-19 infection.  Stay home. Leave your house only to seek medical care. Do not use public transport.  Do not travel while you are sick.  Wash your hands often with soap and water for 20 seconds. If soap and water are not available, use alcohol-based hand sanitizer.  Stay away from other members of your household. Let healthy household members care for children and pets, if possible. If you have to care for children or pets, wash your hands often and wear a mask. If possible, stay in your own room, separate from others. Use a different bathroom.  Make sure that all people in your household wash their hands well and often.  Cough or sneeze into a tissue or your  sleeve or elbow. Do not cough or sneeze into your hand or into the air.  Wear a cloth face covering or face mask. Where to find more information  Centers for Disease Control and Prevention: PurpleGadgets.be  World Health Organization: https://www.castaneda.info/ Contact a health care provider if:  You live in or have traveled to an area where COVID-19 is a risk and you have symptoms of the infection.  You have had contact with someone who has COVID-19 and you have symptoms of the infection. Get help right away if:  You have trouble breathing.  You have pain or pressure in your chest.  You have confusion.  You have bluish lips and fingernails.  You have difficulty waking from sleep.  You have symptoms that get worse. These symptoms may represent a serious problem that is an emergency. Do not wait to see if the symptoms will go away. Get medical help right away. Call your local emergency services (911 in the [place] Do not drive yourself to the hospital. Let the emergency medical personnel know if you think you have COVID-19. Summary  COVID-19 is a respiratory infection that is caused by a virus. It is also known as coronavirus disease or novel coronavirus. It can cause serious infections, such as pneumonia, acute respiratory distress syndrome, acute respiratory failure, or sepsis.  The virus that causes COVID-19 is contagious. This means that it can spread from person to person through droplets from coughs and sneezes.  You are more likely to develop a serious illness if you are 57 years of age or older, have a weak immunity, live in a nursing home, or have chronic disease.  There is no medicine to treat COVID-19. Your health care provider will talk with you about ways to treat your symptoms.  Take steps to protect yourself and others from infection. Wash your hands often and disinfect objects and surfaces that are frequently touched every day.  Stay away from people who are sick and wear a mask if you are sick. This information is not intended to replace advice given to you by your health care provider. Make sure you discuss any questions you have with your health care provider. Document Released: 04/24/2018 Document Revised: 08/14/2018 Document Reviewed: 04/24/2018 Elsevier Patient Education  2020 [employer].

## 2019-03-06 ENCOUNTER — Ambulatory Visit: Payer: Self-pay | Admitting: Family Medicine

## 2019-03-12 ENCOUNTER — Telehealth: Payer: Self-pay | Admitting: Internal Medicine

## 2019-03-12 NOTE — Telephone Encounter (Signed)
Pt c/o Shortness Of Breath: STAT if SOB developed within the last 24 hours or pt is noticeably SOB on the phone  1. Are you currently SOB (can you hear that pt is SOB on the phone)? If she gets up and walks  2. How long have you been experiencing SOB? Since Monday, has taken an extra Lasix daily since Monday  3. Are you SOB when sitting or when up moving around? Up moving around  4. Are you currently experiencing any other symptoms? Upper back, shoulders have been hurting.

## 2019-03-12 NOTE — Telephone Encounter (Signed)
I spoke with Ms. Baltes; she reports progressive exertional dyspnea over the last 3-4 days.  She now gets breathless walking from one room to another.  She has doubled her Lasix, which has helped in the past, without any improvement.  She denies fevers, chills, and sick contacts but has developed a non-productive cough.  Her weight was up 4 pounds from baseline but has dropped by 2 pounds since yesterday.  She denies chest pain but has notices some tightness across her upper back.  I asked her to check her VS while on the phone: BP 142/75, pulse 114 bpm.  She believes that her pulse is regular.  I advised her to try using her albuterol inhaler to see if that helps improve her dyspnea.  We will try to add her on to be seen by the DOD tomorrow.  If her symptoms worsen in the meantime or she develops fevers or chills, she should go the ED for further evaluation.  Nelva Bush, MD [facility]

## 2019-03-12 NOTE — Telephone Encounter (Signed)
Spoke with patient. Monday she started feeling more "out of breath." She was tidying up around the house and had to take several breaks - this is not normal for her. She took extra 20 mg of lasix 3 days in a row with no improvement.  Usually walks about 7 laps with a friend and each day it has become more difficult for her. Today, she could barely do 1 lap and had to stop. Her friend encouraged her to call us because this is abnormal for patient. Reported it is hard to change the sheets on the bed as well and with doing laundry. Reports her back and shoulders are hurting. She is keeping a heating pad on them and that relieves the pain. Taking Ibuprofen as needed for this and it helps some.  Stated again she is "out of breath" and the last time she took extra lasix for about 2 days and the feeling subsided.  This time it is not working. She denies being around or in contact with anyone with suspected Covid.  Denies chest pain, swelling or dizziness. She is voiding well.  First available appointment in our office is 03/18/19 with Sharolyn Douglas.  Advised her to call PCP as well to see if they may have an opening tomorrow and in the meantime, routing to Dr End for further advice.

## 2019-03-13 ENCOUNTER — Other Ambulatory Visit: Payer: Self-pay

## 2019-03-13 ENCOUNTER — Ambulatory Visit (INDEPENDENT_AMBULATORY_CARE_PROVIDER_SITE_OTHER): Payer: Managed Care, Other (non HMO) | Admitting: Cardiology

## 2019-03-13 ENCOUNTER — Encounter: Payer: Self-pay | Admitting: Cardiology

## 2019-03-13 VITALS — BP 125/64 | HR 93 | Ht 63.0 in | Wt 155.0 lb

## 2019-03-13 DIAGNOSIS — I5032 Chronic diastolic (congestive) heart failure: Secondary | ICD-10-CM

## 2019-03-13 DIAGNOSIS — R0600 Dyspnea, unspecified: Secondary | ICD-10-CM | POA: Diagnosis not present

## 2019-03-13 DIAGNOSIS — I2510 Atherosclerotic heart disease of native coronary artery without angina pectoris: Secondary | ICD-10-CM | POA: Diagnosis not present

## 2019-03-13 DIAGNOSIS — Z87891 Personal history of nicotine dependence: Secondary | ICD-10-CM | POA: Diagnosis not present

## 2019-03-13 DIAGNOSIS — R0609 Other forms of dyspnea: Secondary | ICD-10-CM

## 2019-03-13 NOTE — Progress Notes (Addendum)
Cardiology Office Note:    Date:  03/13/2019   ID:  Katelyn Cooper, DOB 04/23/1955, MRN [id number]  PCP:  [name], MD  Cardiologist:  Nelva Bush, MD  Electrophysiologist:  None   Referring MD: [name], MD   Chief Complaint  Patient presents with  . other    Patient c/o shortness of breath with little to no exertion, pounding in her chest that started on Monday and has discomfort in her right shoulder blade.    History of Present Illness:    Katelyn Cooper is a 63 y.o. female with a hx of anxiety, HFpEF, diabetes, former smoker x40 years, hyperlipidemia, nonobstructive CAD, sleep apnea on CPAP who presents due to dyspnea on exertion over the past week.  Patient is usually able to walk around her park with no issues.  This week, she is only able to do three quarters of her walk before getting short of breath and palpitations.  She has also noticed over the past 5 days dyspnea with activities of daily living such as doing her laundry or walking fast around her home.  She checks her weight frequently which is usually 150 pounds and over the past week he has increased to maybe 151.5 pounds.  Her weight today in the office is 155 pounds, 6 months ago on prior follow-up she weighed 151 pounds.  She denies any edema.  She is compliant with her medications.  She denies any dyspnea at rest, denies chest pain but endorses palpitations when she has symptoms..  Last echocardiogram was about 14 months ago showing normal EF with grade 2 diastolic dysfunction.  Left heart cath around the same time showed nonobstructive CAD.  Past Medical History:  Diagnosis Date  . (HFpEF) heart failure with preserved ejection fraction ([place])    a. 12/2015 Echo: EF 55-60%, nl RV size/fxn, no significant valvular abnormalities; b. 10/2017 Echo: EF 50-55%, antsept, ant HK. Gr2 DD. Mild MR. Nl RV fxn. PASP 52mmHg.  [name] Anxiety   . CHF (congestive heart failure) ([place])   . Depression   . Diabetes type  2, controlled ([place])    diet-controlled  . Esophageal stricture   . GERD (gastroesophageal reflux disease)   . Hiatal hernia   . History of chicken pox   . Hyperlipidemia   . Hypertension   . Internal hemorrhoids   . Non-obstructive CAD (coronary artery disease)    a. 12/2015 MV: apical ant and apical reversible defect. EF 55-65%; b. 01/2016 Cath: LM nl, LAD nl, SP1 40ost, LCX nl, OM1 30, RCA 30p/m, EDP 15-89mmHg; c. 10/2017 Cath: LM nl, LAD nl, SP1 30ost, LCX nl, OM1 30, RCA 30p/m. EF 55%.  . Obstructive sleep apnea   . PAD (peripheral artery disease) ([place])    a. 1990's s/p prior LCIA stenting; b. 12/2015 ABI: R 1.05, L 1.03.  . Panic disorder     Past Surgical History:  Procedure Laterality Date  . [place]   stent placement; ? left iliac artery intervention  . APPENDECTOMY  1998  . BASAL CELL CARCINOMA EXCISION    . CARDIAC CATHETERIZATION N/A 01/12/2016   Procedure: Left Heart Cath and Coronary Angiography;  Surgeon: Nelva Bush, MD;  Location: [facility];  Service: Cardiovascular;  Laterality: N/A;  . FOOT SURGERY     Right  . pap smear  03/2010   Done by [name], normal  . REFRACTIVE SURGERY     right  . RIB  RESECTION  [id number]  . RIGHT/LEFT HEART CATH AND CORONARY ANGIOGRAPHY N/A 11/08/2017   Procedure: RIGHT/LEFT HEART CATH AND CORONARY ANGIOGRAPHY;  Surgeon: Wellington Hampshire, MD;  Location: [facility];  Service: Cardiovascular;  Laterality: N/A;  . SHOULDER SURGERY  2005   left  . UPPER GI ENDOSCOPY  09/19/2011   Stricture at GE junction, dilated. Mild gastritis and duodenitis. Small hiatal hernia    Current Medications: Current Meds  Medication Sig  . albuterol (PROAIR HFA) 108 (90 Base) MCG/ACT inhaler Inhale 2 puffs into the lungs every 4 (four) hours as needed for wheezing or shortness of breath.  [name] aspirin EC 81 MG tablet Take 1 tablet (81 mg total) by mouth daily.  [name] atorvastatin (LIPITOR) 40 MG tablet TAKE 1 TABLET  BY MOUTH (40MG  TOTAL) DAILY  . Cholecalciferol (VITAMIN D3) 5000 units TABS Take 1 tablet by mouth daily.   . furosemide (LASIX) 20 MG tablet Take 1 tablet (20 mg total) by mouth daily. *NEEDS OFFICE VISIT FOR FURTHER REFILLS*  . glucose blood test strip ONETOUCH ULTRA MINI STRIPS AND LANCETS. Use one daily to check blood sugar.  . ibuprofen (ADVIL) 600 MG tablet Take 1 tablet (600 mg total) by mouth every 8 (eight) hours as needed.  [name] KLOR-CON M20 20 MEQ tablet TAKE 2 TABLETS TWICE A DAY  . lansoprazole (PREVACID) 15 MG capsule Take 15 mg by mouth daily at 6 (six) AM.  . metFORMIN (GLUCOPHAGE-XR) 500 MG 24 hr tablet TAKE 1 TABLET BY MOUTH EVERY EVENING  . metoprolol tartrate (LOPRESSOR) 25 MG tablet Take 0.5 tablets (12.5 mg total) by mouth 2 (two) times daily.  . QUEtiapine (SEROQUEL) 100 MG tablet Take 1 tablet (100 mg total) by mouth at bedtime.  [name] venlafaxine XR (EFFEXOR-XR) 75 MG 24 hr capsule Take 1 capsule (75 mg total) by mouth daily with breakfast.  . zaleplon (SONATA) 10 MG capsule Take 1 capsule (10 mg total) by mouth at bedtime as needed for sleep.     Allergies:   Lovastatin, Paroxetine hcl, and Lamotrigine   Social History   Socioeconomic History  . Marital status: Married    Spouse name: mark  . Number of children: 0  . Years of education: Not on file  . Highest education level: High school graduate  Occupational History  . Occupation: computer-retired    Employer: [employer]  Tobacco Use  . Smoking status: Former Smoker    Packs/day: 1.00    Years: 40.00    Pack years: 40.00    Types: Cigarettes    Start date: 03/01/1975    Quit date: 08/11/2017    Years since quitting: 1.5  . Smokeless tobacco: Never Used  Substance and Sexual Activity  . Alcohol use: No    Alcohol/week: 0.0 standard drinks  . Drug use: No  . Sexual activity: Not Currently  Other Topics Concern  . Not on file  Social History Narrative  . Not on file   Social Determinants of Health    Financial Resource Strain:   . Difficulty of Paying Living Expenses: Not on file  Food Insecurity:   . Worried About Charity fundraiser in the Last Year: Not on file  . Ran Out of Food in the Last Year: Not on file  Transportation Needs:   . Lack of Transportation (Medical): Not on file  . Lack of Transportation (Non-Medical): Not on file  Physical Activity:   . Days of Exercise per Week: Not on file  .  Minutes of Exercise per Session: Not on file  Stress:   . Feeling of Stress : Not on file  Social Connections:   . Frequency of Communication with Friends and Family: Not on file  . Frequency of Social Gatherings with Friends and Family: Not on file  . Attends Religious Services: Not on file  . Active Member of Clubs or Organizations: Not on file  . Attends Archivist Meetings: Not on file  . Marital Status: Not on file     Family History: The patient's family history includes Alcohol abuse in her father, mother, and another family member; Bipolar disorder in her sister and another family member; Breast cancer in her sister; Cancer in her mother; Depression in her mother and another family member; Drug abuse in her sister; Emphysema in her mother and sister; Esophageal cancer in her maternal aunt; Heart disease in her maternal grandmother, mother, and another family member; Lung cancer in her mother; Mood Disorder in her father; Stroke in her maternal grandmother; Vision loss in an other family member. There is no history of Colon cancer.  ROS:   Please see the history of present illness.     All other systems reviewed and are negative.  EKGs/Labs/Other Studies Reviewed:    The following studies were reviewed today: LHC 2017-11-09  Ost 1st Sept lesion is 30% stenosed.  1st Mrg lesion is 30% stenosed.  Prox RCA lesion is 30% stenosed.  Mid RCA lesion is 30% stenosed.  The left ventricular systolic function is normal.  LV end diastolic pressure is mildly  elevated.   1.  Mild nonobstructive coronary artery disease. 2.  Normal LV systolic function with an EF of 55% with borderline mid anterior wall hypokinesis likely due to IVCD. 3.  Right heart catheterization showed high normal filling pressures, normal pulmonary pressure and moderately reduced cardiac output.  Pulmonary capillary wedge pressure was 11 mmHg, PA pressure was 27 over 11 mmHg, cardiac output was 3.1 with a cardiac index of 1.8.  TTE 11/06/2017 Left ventricle: The cavity size was normal. Wall thickness was   normal. Systolic function was normal. The estimated ejection   fraction was in the range of 50% to 55%. There is hypokinesis of   the anteroseptal and anterior myocardium. Features are consistent   with a pseudonormal left ventricular filling pattern, with   concomitant abnormal relaxation and increased filling pressure   (grade 2 diastolic dysfunction). Doppler parameters are   consistent with high ventricular filling pressure. - Mitral valve: There was mild regurgitation. - Right ventricle: The cavity size was normal. Wall thickness was   normal. Systolic function was normal. - Pulmonary arteries: Systolic pressure was mildly increased,   estimated to be 35 mm Hg.  EKG:  EKG is  ordered today.  The ekg ordered today demonstrates normal sinus rhythm, left bundle branch block.  Recent Labs: 04/28/2018: BUN 9; Creatinine, Ser 0.90; Potassium 4.1; Sodium 137  Recent Lipid Panel    Component Value Date/Time   CHOL 137 03/21/2018 1500   TRIG 140 03/21/2018 1500   HDL 50 03/21/2018 1500   CHOLHDL 2.7 03/21/2018 1500   LDLCALC 59 03/21/2018 1500    Physical Exam:    VS:  BP 125/64 (BP Location: Left Arm, Patient Position: Sitting, Cuff Size: Normal)   Pulse 93   Ht 5\' 3"  (1.6 m)   Wt 155 lb (70.3 kg)   SpO2 98%   BMI 27.46 kg/m     Wt Readings from  Last 3 Encounters:  03/13/19 155 lb (70.3 kg)  10/29/18 150 lb (68 kg)  09/26/18 151 lb 4 oz (68.6 kg)     GEN:   Well nourished, well developed in no acute distress HEENT: Normal NECK: No JVD; No carotid bruits LYMPHATICS: No lymphadenopathy CARDIAC: RRR, no murmurs, rubs, gallops RESPIRATORY:  Clear to auscultation without rales, wheezing or rhonchi  ABDOMEN: Soft, non-tender, non-distended MUSCULOSKELETAL:  No edema; No deformity  SKIN: Warm and dry NEUROLOGIC:  Alert and oriented x 3 PSYCHIATRIC:  Normal affect   ASSESSMENT:   Patient presents with dyspnea on exertion worsening over the past week.  She appears euvolemic on my clinical exam.  No significant weight gain over the past 6 months.  Symptoms could be an anginal equivalent.  Last left heart cath performed 16 months ago showed nonobstructive disease.  It is also possible patient symptoms could be pulmonary in origin due to 40+ years of smoking history.  If cardiac work-up is unrevealing, patient will need formal pulmonary function testing to evaluate presence and or severity of pulmonary disease.   1. Dyspnea on exertion   2. Chronic heart failure with preserved ejection fraction (HFpEF) ([place])   3. Former smoker   4. CAD in native artery    PLAN:    In order of problems above  1. obtain echocardiogram to evaluate any structural changes.  Get a Lexiscan to evaluate for any obstructive/ischemic disease 2. Continue beta-blocker, Lasix as currently prescribed 3. If echo and Lexiscan are within normal limits, will recommend pulmonary function testing due to her prior long smoking history. 4. Continue aspirin and Lipitor as currently prescribed.  Follow-up after above testing.  This note was generated in part or whole with voice recognition software. Voice recognition is usually quite accurate but there are transcription errors that can and very often do occur. I apologize for any typographical errors that were not detected and corrected.  Medication Adjustments/Labs and Tests Ordered: Current medicines are reviewed at length with the  patient today.  Concerns regarding medicines are outlined above.  Orders Placed This Encounter  Procedures  . NM Myocar Multi W/Spect W/Wall Motion / EF  . EKG 12-Lead  . ECHOCARDIOGRAM COMPLETE   No orders of the defined types were placed in this encounter.   Patient Instructions  Medication Instructions:  No changes  *If you need a refill on your cardiac medications before your next appointment, please call your pharmacy*  Lab Work: None  If you have labs (blood work) drawn today and your tests are completely normal, you will receive your results only by: [name] MyChart Message (if you have MyChart) OR . A paper copy in the mail If you have any lab test that is abnormal or we need to change your treatment, we will call you to review the results.  Testing/Procedures: Your physician has requested that you have an echocardiogram. Echocardiography is a painless test that uses sound waves to create images of your heart. It provides your doctor with information about the size and shape of your heart and how well your heart's chambers and valves are working. This procedure takes approximately one hour. There are no restrictions for this procedure.  [place]  Your caregiver has ordered a Stress Test with nuclear imaging. The purpose of this test is to evaluate the blood supply to your heart muscle. This procedure is referred to as a "Non-Invasive Stress Test." This is because other than having an IV started in your vein,  nothing is inserted or "invades" your body. Cardiac stress tests are done to find areas of poor blood flow to the heart by determining the extent of coronary artery disease (CAD). Some patients exercise on a treadmill, which naturally increases the blood flow to your heart, while others who are  unable to walk on a treadmill due to physical limitations have a pharmacologic/chemical stress agent called Lexiscan . This medicine will mimic walking on a treadmill by temporarily  increasing your coronary blood flow.   Please note: these test may take anywhere between 2-4 hours to complete  PLEASE REPORT TO [place] AT THE FIRST DESK WILL DIRECT YOU WHERE TO GO  Date of Procedure:_____________________________________  Arrival Time for Procedure:______________________________  Instructions regarding medication:   _XX___ : Hold diabetes medication morning of procedure  _XX___:  Hold Metoprolol night before procedure and morning of procedure _XX___ : Hold Furosemide & Potassium medication morning of procedure  PLEASE NOTIFY THE OFFICE AT LEAST 24 HOURS IN ADVANCE IF YOU ARE UNABLE TO KEEP YOUR APPOINTMENT.  [phone] AND  PLEASE NOTIFY NUCLEAR MEDICINE AT [facility] AT LEAST 24 HOURS IN ADVANCE IF YOU ARE UNABLE TO KEEP YOUR APPOINTMENT. [phone]  How to prepare for your Myoview test:  1. Do not eat or drink after midnight 2. No caffeine for 24 hours prior to test 3. No smoking 24 hours prior to test. 4. Your medication may be taken with water.  If your doctor stopped a medication because of this test, do not take that medication. 5. Ladies, please do not wear dresses.  Skirts or pants are appropriate. Please wear a short sleeve shirt. 6. No perfume, cologne or lotion. 7. Wear comfortable walking shoes. No heels!       Follow-Up: At [facility], you and your health needs are our priority.  As part of our continuing mission to provide you with exceptional heart care, we have created designated Provider Care Teams.  These Care Teams include your primary Cardiologist (physician) and Advanced Practice Providers (APPs -  Physician Assistants and Nurse Practitioners) who all work together to provide you with the care you need, when you need it.  Your next appointment:   Follow up with Dr. Harrell Gave End after all testing has been done.       Signed, Kate Sable, MD  03/13/2019 11:35 AM    [place]

## 2019-03-13 NOTE — Patient Instructions (Addendum)
Medication Instructions:  No changes  *If you need a refill on your cardiac medications before your next appointment, please call your pharmacy*  Lab Work: None  If you have labs (blood work) drawn today and your tests are completely normal, you will receive your results only by: [name] MyChart Message (if you have MyChart) OR . A paper copy in the mail If you have any lab test that is abnormal or we need to change your treatment, we will call you to review the results.  Testing/Procedures: Your physician has requested that you have an echocardiogram. Echocardiography is a painless test that uses sound waves to create images of your heart. It provides your doctor with information about the size and shape of your heart and how well your heart's chambers and valves are working. This procedure takes approximately one hour. There are no restrictions for this procedure.  [place]  Your caregiver has ordered a Stress Test with nuclear imaging. The purpose of this test is to evaluate the blood supply to your heart muscle. This procedure is referred to as a "Non-Invasive Stress Test." This is because other than having an IV started in your vein, nothing is inserted or "invades" your body. Cardiac stress tests are done to find areas of poor blood flow to the heart by determining the extent of coronary artery disease (CAD). Some patients exercise on a treadmill, which naturally increases the blood flow to your heart, while others who are  unable to walk on a treadmill due to physical limitations have a pharmacologic/chemical stress agent called Lexiscan . This medicine will mimic walking on a treadmill by temporarily increasing your coronary blood flow.   Please note: these test may take anywhere between 2-4 hours to complete  PLEASE REPORT TO [place] AT THE FIRST DESK WILL DIRECT YOU WHERE TO GO  Date of Procedure:_____________________________________  Arrival Time  for Procedure:______________________________  Instructions regarding medication:   _XX___ : Hold diabetes medication morning of procedure  _XX___:  Hold Metoprolol night before procedure and morning of procedure _XX___ : Hold Furosemide & Potassium medication morning of procedure  PLEASE NOTIFY THE OFFICE AT LEAST 24 HOURS IN ADVANCE IF YOU ARE UNABLE TO KEEP YOUR APPOINTMENT.  [phone] AND  PLEASE NOTIFY NUCLEAR MEDICINE AT [facility] AT LEAST 24 HOURS IN ADVANCE IF YOU ARE UNABLE TO KEEP YOUR APPOINTMENT. [phone]  How to prepare for your Myoview test:  1. Do not eat or drink after midnight 2. No caffeine for 24 hours prior to test 3. No smoking 24 hours prior to test. 4. Your medication may be taken with water.  If your doctor stopped a medication because of this test, do not take that medication. 5. Ladies, please do not wear dresses.  Skirts or pants are appropriate. Please wear a short sleeve shirt. 6. No perfume, cologne or lotion. 7. Wear comfortable walking shoes. No heels!       Follow-Up: At [facility], you and your health needs are our priority.  As part of our continuing mission to provide you with exceptional heart care, we have created designated Provider Care Teams.  These Care Teams include your primary Cardiologist (physician) and Advanced Practice Providers (APPs -  Physician Assistants and Nurse Practitioners) who all work together to provide you with the care you need, when you need it.  Your next appointment:   Follow up with Dr. Harrell Gave End after all testing has been done.

## 2019-03-16 ENCOUNTER — Ambulatory Visit (INDEPENDENT_AMBULATORY_CARE_PROVIDER_SITE_OTHER): Payer: Managed Care, Other (non HMO) | Admitting: Orthopedic Surgery

## 2019-03-16 ENCOUNTER — Telehealth: Payer: Self-pay | Admitting: Internal Medicine

## 2019-03-16 ENCOUNTER — Other Ambulatory Visit: Payer: Self-pay

## 2019-03-16 DIAGNOSIS — M25522 Pain in left elbow: Secondary | ICD-10-CM | POA: Diagnosis not present

## 2019-03-16 DIAGNOSIS — R911 Solitary pulmonary nodule: Secondary | ICD-10-CM

## 2019-03-16 NOTE — Telephone Encounter (Signed)
Insurance denied CT Chest due to patient not having a CXR prior to requesting CT Chest.  Please place order for CXR if you would like to proceed with trying to obtain CT Chest approval. Catha Gosselin

## 2019-03-16 NOTE — Telephone Encounter (Signed)
Will route to Dr. Mortimer Fries to make aware.

## 2019-03-17 ENCOUNTER — Encounter: Payer: Self-pay | Admitting: Orthopedic Surgery

## 2019-03-17 NOTE — Telephone Encounter (Signed)
CXR has been ordered for lung nodule.  Pt is aware of this information and voiced her understanding.  Nothing further is needed.

## 2019-03-17 NOTE — Telephone Encounter (Signed)
Please order CXR prior to CT chest

## 2019-03-17 NOTE — Telephone Encounter (Signed)
What diagnosis would you like to use for CXR?

## 2019-03-17 NOTE — Progress Notes (Signed)
Office Visit Note   Patient: Katelyn Cooper           Date of Birth: 07-30-1955           MRN: [id number] Visit Date: 03/16/2019 Requested by: [name], MD [address] [place],  [place] [address] PCP: [name], MD  Subjective: Chief Complaint  Patient presents with  . Follow-up    HPI: Katelyn Cooper is a patient with left elbow pain.  She is somewhat better.  She is not been leaning on the elbow as much.  Denies any ulnar nerve symptoms.  Topical may have helped.  Decision point today was for or against MRI scanning depending on her symptoms.              ROS: All systems reviewed are negative as they relate to the chief complaint within the history of present illness.  Patient denies  fevers or chills.   Assessment & Plan: Visit Diagnoses:  1. Pain in left elbow     Plan: Impression is improved medial elbow pain.  Plan is continue with topicals and avoiding direct compression on the medial aspect of the elbow follow-up with [name] as needed.  No indication for further scanning at this time.  No mechanical symptoms in the elbow.  Follow-Up Instructions: Return if symptoms worsen or fail to improve.   Orders:  No orders of the defined types were placed in this encounter.  No orders of the defined types were placed in this encounter.     Procedures: No procedures performed   Clinical Data: No additional findings.  Objective: Vital Signs: There were no vitals taken for this visit.  Physical Exam:   Constitutional: Patient appears well-developed HEENT:  Head: Normocephalic Eyes:EOM are normal Neck: Normal range of motion Cardiovascular: Normal rate Pulmonary/chest: Effort normal Neurologic: Patient is alert Skin: Skin is warm Psychiatric: Patient has normal mood and affect    Ortho Exam: Ortho exam demonstrates full active and passive range of motion of the shoulder and wrist on the left-hand side.  Elbow also has full range of motion  pronation supination flexion extension.  Radial pulses intact.  No masses lymphadenopathy or skin changes noted in that elbow region.  Ulnar nerve is not subluxating.  No real tenderness to palpation on the medial or lateral epicondyle or pain with resisted wrist extension or wrist flexion.  Specialty Comments:  No specialty comments available.  Imaging: No results found.   PMFS History: Patient Active Problem List   Diagnosis Date Noted  . Bipolar disorder in remission ([place]) 02/09/2019  . Post traumatic stress disorder (PTSD) 02/09/2019  . Chronic diastolic heart failure ([place]) 11/15/2017  . HTN (hypertension) 11/15/2017  . Obstructive sleep apnea 11/15/2017  . NICM (nonischemic cardiomyopathy) ([place]) 11/13/2017  . Coronary artery disease involving native coronary artery of native heart without angina pectoris 11/13/2017  . Menopausal symptom 06/13/2017  . Osteopenia 06/13/2017  . Uterine leiomyoma 06/13/2017  . Peripheral vascular disease ([place]) 09/19/2016  . Hyperlipidemia LDL goal <70 09/19/2016  . Coronary atherosclerosis 09/05/2016  . Abnormal stress test 01/12/2016  . Pleuritic chest pain 07/28/2015  . Duodenitis 01/05/2015  . Dermatitis, nummular 01/05/2015  . Allergic rhinitis 12/23/2014  . Anxiety 12/23/2014  . Ataxia 12/23/2014  . Back ache 12/23/2014  . Cervical muscle strain 12/23/2014  . Depression 12/23/2014  . Eczema 12/23/2014  . GERD (gastroesophageal reflux disease) 12/23/2014  . History of anemia 12/23/2014  . Pure hypercholesterolemia 12/23/2014  .  Episodic mood disorder ([place]) 12/23/2014  . Nephropyelitis 12/23/2014  . Situational stress 12/23/2014  . Tremor 12/23/2014  . Vitamin D deficiency 11/03/2013  . Diabetes mellitus type 2, controlled ([place]) 08/13/2011  . Cough 05/10/2011  . Chest pain 05/10/2011  . Tobacco abuse 05/10/2011   Past Medical History:  Diagnosis Date  . (HFpEF) heart failure with preserved ejection fraction ([name])    a. 12/2015  Echo: EF 55-60%, nl RV size/fxn, no significant valvular abnormalities; b. 10/2017 Echo: EF 50-55%, antsept, ant HK. Gr2 DD. Mild MR. Nl RV fxn. PASP 70mmHg.  [name] Anxiety   . CHF (congestive heart failure) ([name])   . Depression   . Diabetes type 2, controlled ([name])    diet-controlled  . Esophageal stricture   . GERD (gastroesophageal reflux disease)   . Hiatal hernia   . History of chicken pox   . Hyperlipidemia   . Hypertension   . Internal hemorrhoids   . Non-obstructive CAD (coronary artery disease)    a. 12/2015 MV: apical ant and apical reversible defect. EF 55-65%; b. 01/2016 Cath: LM nl, LAD nl, SP1 40ost, LCX nl, OM1 30, RCA 30p/m, EDP 15-20mmHg; c. 10/2017 Cath: LM nl, LAD nl, SP1 30ost, LCX nl, OM1 30, RCA 30p/m. EF 55%.  . Obstructive sleep apnea   . PAD (peripheral artery disease) ([name])    a. 1990's s/p prior LCIA stenting; b. 12/2015 ABI: R 1.05, L 1.03.  . Panic disorder     Family History  Problem Relation Age of Onset  . Lung cancer Mother        was a smoker  . Emphysema Mother   . Cancer Mother        Lung  . Alcohol abuse Mother   . Depression Mother   . Heart disease Mother   . Drug abuse Sister   . Breast cancer Sister   . Emphysema Sister   . Bipolar disorder Sister   . Alcohol abuse Other   . Depression Other   . Bipolar disorder Other   . Heart disease Other   . Vision loss Other   . Alcohol abuse Father   . Mood Disorder Father   . Heart disease Maternal Grandmother   . Stroke Maternal Grandmother   . Esophageal cancer Maternal Aunt   . Colon cancer Neg Hx     Past Surgical History:  Procedure Laterality Date  . [place]   stent placement; ? left iliac artery intervention  . APPENDECTOMY  1998  . BASAL CELL CARCINOMA EXCISION    . CARDIAC CATHETERIZATION N/A 01/12/2016   Procedure: Left Heart Cath and Coronary Angiography;  Surgeon: Nelva Bush, MD;  Location: [facility];  Service: Cardiovascular;  Laterality: N/A;  . FOOT  SURGERY     Right  . pap smear  03/2010   Done by [name], normal  . REFRACTIVE SURGERY     right  . RIB RESECTION  [id number]  . RIGHT/LEFT HEART CATH AND CORONARY ANGIOGRAPHY N/A 11/08/2017   Procedure: RIGHT/LEFT HEART CATH AND CORONARY ANGIOGRAPHY;  Surgeon: Wellington Hampshire, MD;  Location: [facility];  Service: Cardiovascular;  Laterality: N/A;  . SHOULDER SURGERY  2005   left  . UPPER GI ENDOSCOPY  09/19/2011   Stricture at GE junction, dilated. Mild gastritis and duodenitis. Small hiatal hernia   Social History   Occupational History  . Occupation: computer-retired    Employer: [employer]  Tobacco Use  .  Smoking status: Former Smoker    Packs/day: 1.00    Years: 40.00    Pack years: 40.00    Types: Cigarettes    Start date: 03/01/1975    Quit date: 08/11/2017    Years since quitting: 1.5  . Smokeless tobacco: Never Used  Substance and Sexual Activity  . Alcohol use: No    Alcohol/week: 0.0 standard drinks  . Drug use: No  . Sexual activity: Not Currently

## 2019-03-17 NOTE — Telephone Encounter (Signed)
What was dx for CT chest?

## 2019-03-17 NOTE — Telephone Encounter (Signed)
Call routed to [facility] to place CXR order. [name]

## 2019-03-18 ENCOUNTER — Ambulatory Visit: Payer: Self-pay | Admitting: Family Medicine

## 2019-03-18 ENCOUNTER — Ambulatory Visit: Payer: Managed Care, Other (non HMO) | Admitting: Nurse Practitioner

## 2019-03-20 ENCOUNTER — Encounter (HOSPITAL_BASED_OUTPATIENT_CLINIC_OR_DEPARTMENT_OTHER)
Admission: RE | Admit: 2019-03-20 | Discharge: 2019-03-20 | Disposition: A | Discharge status: Home / Self Care | Payer: Managed Care, Other (non HMO) | Source: Ambulatory Visit | Attending: Cardiology | Admitting: Cardiology

## 2019-03-20 ENCOUNTER — Ambulatory Visit
Admission: RE | Admit: 2019-03-20 | Discharge: 2019-03-20 | Disposition: A | Discharge status: Home / Self Care | Payer: Managed Care, Other (non HMO) | Source: Ambulatory Visit | Attending: Internal Medicine | Admitting: Internal Medicine

## 2019-03-20 ENCOUNTER — Other Ambulatory Visit: Payer: Self-pay

## 2019-03-20 DIAGNOSIS — R911 Solitary pulmonary nodule: Secondary | ICD-10-CM | POA: Diagnosis not present

## 2019-03-20 DIAGNOSIS — R0609 Other forms of dyspnea: Secondary | ICD-10-CM

## 2019-03-20 DIAGNOSIS — R0600 Dyspnea, unspecified: Secondary | ICD-10-CM

## 2019-03-20 LAB — NM MYOCAR MULTI W/SPECT W/WALL MOTION / EF
LV dias vol: 50 mL (ref 46–106)
LV sys vol: 12 mL
Peak HR: 108 {beats}/min
Percent HR: 69 %
Rest HR: 97 {beats}/min
SDS: 0
SRS: 7
SSS: 2
TID: 1.06

## 2019-03-20 MED ORDER — REGADENOSON 0.4 MG/5ML IV SOLN
0.4000 mg | Freq: Once | INTRAVENOUS | Status: AC
  Administered 2019-03-20: 0.4 mg via INTRAVENOUS

## 2019-03-20 MED ORDER — TECHNETIUM TC 99M TETROFOSMIN IV KIT
33.0200 | PACK | Freq: Once | INTRAVENOUS | Status: AC | PRN
  Administered 2019-03-20: 33.02 via INTRAVENOUS

## 2019-03-20 MED ORDER — TECHNETIUM TC 99M TETROFOSMIN IV KIT
10.0000 | PACK | Freq: Once | INTRAVENOUS | Status: AC | PRN
  Administered 2019-03-20: 10.82 via INTRAVENOUS

## 2019-03-23 ENCOUNTER — Other Ambulatory Visit: Payer: Self-pay | Admitting: Family Medicine

## 2019-03-30 ENCOUNTER — Encounter: Payer: Self-pay | Admitting: Emergency Medicine

## 2019-03-30 ENCOUNTER — Other Ambulatory Visit: Payer: Self-pay

## 2019-03-30 ENCOUNTER — Ambulatory Visit
Admission: EM | Admit: 2019-03-30 | Discharge: 2019-03-30 | Disposition: A | Discharge status: Home / Self Care | Payer: Managed Care, Other (non HMO) | Attending: Emergency Medicine | Admitting: Emergency Medicine

## 2019-03-30 ENCOUNTER — Telehealth: Payer: Self-pay

## 2019-03-30 DIAGNOSIS — M546 Pain in thoracic spine: Secondary | ICD-10-CM

## 2019-03-30 MED ORDER — CYCLOBENZAPRINE HCL 10 MG PO TABS: 10.0000 mg | ORAL_TABLET | Freq: Two times a day (BID) | ORAL | 0 refills | Status: DC | PRN

## 2019-03-30 NOTE — Telephone Encounter (Signed)
Call to patient to discuss stress test readings.   No further questions or orders at this time.   Pt cooperative with POC.   Advised pt to call for any further questions or concerns.

## 2019-03-30 NOTE — ED Triage Notes (Signed)
Patient in office today c/o upper back pain x 1wk w/ burn on back due to use of heating pad the only thing that has help per patient.  [id number]

## 2019-03-30 NOTE — Discharge Instructions (Addendum)
Take the Tylenol as needed for your pain.  Take the muscle relaxer Flexeril as needed for muscle spasm; do not drive, operate machinery, or drink alcohol with this medication as it may make you drowsy.    Follow up with your primary care provider t if your pain is not improving.  Go to the emergency department if you have worsening pain or develop new symptoms such as difficulty with urination, weakness, numbness, loss of control of your bladder or bowels, fever, or chills.

## 2019-03-30 NOTE — Telephone Encounter (Signed)
-----   Message from Kate Sable, MD sent at 03/20/2019  4:08 PM EST ----- Stress test with no no evidence of ischemia.  Mild coronary calcification.  Moderate calcification of the descending aorta.  Continue aspirin and Lipitor as currently prescribed.  Low risk scan

## 2019-03-30 NOTE — ED Provider Notes (Signed)
[facility]    CSN: [id number] Arrival date & time: 03/30/19  1119      History   Chief Complaint Chief Complaint  Patient presents with  . Back Pain    HPI Katelyn Cooper is a 63 y.o. female.   Patient presents with muscle spasm and pain in her mid back x1 week.  She denies falls or injury.  She has been treating this at home with a heating pad but used it so much that she caused a "burn" on her back.  She rates her back pain at 6/10, nonradiating, worse with movement, improves with heat.  She has treated her symptoms at home with Tylenol also.  She denies numbness, tingling, weakness in her extremities.  She denies loss of bowel/bladder control.    The history is provided by the patient.    Past Medical History:  Diagnosis Date  . (HFpEF) heart failure with preserved ejection fraction ([place])    a. 12/2015 Echo: EF 55-60%, nl RV size/fxn, no significant valvular abnormalities; b. 10/2017 Echo: EF 50-55%, antsept, ant HK. Gr2 DD. Mild MR. Nl RV fxn. PASP 77mmHg.  [name] Anxiety   . CHF (congestive heart failure) ([place])   . Depression   . Diabetes type 2, controlled ([place])    diet-controlled  . Esophageal stricture   . GERD (gastroesophageal reflux disease)   . Hiatal hernia   . History of chicken pox   . Hyperlipidemia   . Hypertension   . Internal hemorrhoids   . Non-obstructive CAD (coronary artery disease)    a. 12/2015 MV: apical ant and apical reversible defect. EF 55-65%; b. 01/2016 Cath: LM nl, LAD nl, SP1 40ost, LCX nl, OM1 30, RCA 30p/m, EDP 15-33mmHg; c. 10/2017 Cath: LM nl, LAD nl, SP1 30ost, LCX nl, OM1 30, RCA 30p/m. EF 55%.  . Obstructive sleep apnea   . PAD (peripheral artery disease) ([place])    a. 1990's s/p prior LCIA stenting; b. 12/2015 ABI: R 1.05, L 1.03.  . Panic disorder     Patient Active Problem List   Diagnosis Date Noted  . Bipolar disorder in remission ([place]) 02/09/2019  . Post traumatic stress disorder (PTSD) 02/09/2019  . Chronic diastolic  heart failure ([place]) 11/15/2017  . HTN (hypertension) 11/15/2017  . Obstructive sleep apnea 11/15/2017  . NICM (nonischemic cardiomyopathy) ([place]) 11/13/2017  . Coronary artery disease involving native coronary artery of native heart without angina pectoris 11/13/2017  . Menopausal symptom 06/13/2017  . Osteopenia 06/13/2017  . Uterine leiomyoma 06/13/2017  . Peripheral vascular disease ([place]) 09/19/2016  . Hyperlipidemia LDL goal <70 09/19/2016  . Coronary atherosclerosis 09/05/2016  . Abnormal stress test 01/12/2016  . Pleuritic chest pain 07/28/2015  . Duodenitis 01/05/2015  . Dermatitis, nummular 01/05/2015  . Allergic rhinitis 12/23/2014  . Anxiety 12/23/2014  . Ataxia 12/23/2014  . Back ache 12/23/2014  . Cervical muscle strain 12/23/2014  . Depression 12/23/2014  . Eczema 12/23/2014  . GERD (gastroesophageal reflux disease) 12/23/2014  . History of anemia 12/23/2014  . Pure hypercholesterolemia 12/23/2014  . Episodic mood disorder ([place]) 12/23/2014  . Nephropyelitis 12/23/2014  . Situational stress 12/23/2014  . Tremor 12/23/2014  . Vitamin D deficiency 11/03/2013  . Diabetes mellitus type 2, controlled ([place]) 08/13/2011  . Cough 05/10/2011  . Chest pain 05/10/2011  . Tobacco abuse 05/10/2011    Past Surgical History:  Procedure Laterality Date  . [place]   stent placement; ? left iliac artery intervention  .  APPENDECTOMY  1998  . BASAL CELL CARCINOMA EXCISION    . CARDIAC CATHETERIZATION N/A 01/12/2016   Procedure: Left Heart Cath and Coronary Angiography;  Surgeon: Nelva Bush, MD;  Location: [facility];  Service: Cardiovascular;  Laterality: N/A;  . FOOT SURGERY     Right  . pap smear  03/2010   Done by [name], normal  . REFRACTIVE SURGERY     right  . RIB RESECTION  [id number]  . RIGHT/LEFT HEART CATH AND CORONARY ANGIOGRAPHY N/A 11/08/2017   Procedure: RIGHT/LEFT HEART CATH AND CORONARY ANGIOGRAPHY;  Surgeon: Wellington Hampshire, MD;  Location: [facility];  Service: Cardiovascular;  Laterality: N/A;  . SHOULDER SURGERY  2005   left  . UPPER GI ENDOSCOPY  09/19/2011   Stricture at GE junction, dilated. Mild gastritis and duodenitis. Small hiatal hernia    OB History   No obstetric history on file.      Home Medications    Prior to Admission medications   Medication Sig Start Date End Date Taking? Authorizing Provider  albuterol (PROAIR HFA) 108 (90 Base) MCG/ACT inhaler Inhale 2 puffs into the lungs every 4 (four) hours as needed for wheezing or shortness of breath. 06/20/17   Flora Lipps, MD  aspirin EC 81 MG tablet Take 1 tablet (81 mg total) by mouth daily. 12/12/15   End, Harrell Gave, MD  atorvastatin (LIPITOR) 40 MG tablet TAKE 1 TABLET BY MOUTH (40MG  TOTAL) DAILY 11/04/18   End, Harrell Gave, MD  Cholecalciferol (VITAMIN D3) 5000 units TABS Take 1 tablet by mouth daily.  04/09/16   [provider]  cyclobenzaprine (FLEXERIL) 10 MG tablet Take 1 tablet (10 mg total) by mouth 2 (two) times daily as needed for muscle spasms. 03/30/19   Sharion Balloon, NP  furosemide (LASIX) 20 MG tablet Take 1 tablet (20 mg total) by mouth daily. *NEEDS OFFICE VISIT FOR FURTHER REFILLS* 03/02/19   End, Harrell Gave, MD  glucose blood test strip ONETOUCH ULTRA MINI STRIPS AND LANCETS. Use one daily to check blood sugar. 11/18/17   [name], MD  ibuprofen (ADVIL) 600 MG tablet Take 1 tablet (600 mg total) by mouth every 8 (eight) hours as needed. 10/29/18   Mar Daring, PA-C  KLOR-CON M20 20 MEQ tablet TAKE 2 TABLETS TWICE A DAY 01/08/19   Rise Mu, PA-C  lansoprazole (PREVACID) 15 MG capsule Take 15 mg by mouth daily at 6 (six) AM.    [provider]  metFORMIN (GLUCOPHAGE-XR) 500 MG 24 hr tablet TAKE 1 TABLET BY MOUTH EVERY EVENING 10/27/18   [name], MD  metoprolol tartrate (LOPRESSOR) 25 MG tablet TAKE ONE-HALF (1/2) TABLET BY MOUTH 2 TIMES DAILY 03/23/19   [name], MD    QUEtiapine (SEROQUEL) 100 MG tablet Take 1 tablet (100 mg total) by mouth at bedtime. 02/09/19   [place] Crane, MD  venlafaxine XR (EFFEXOR-XR) 75 MG 24 hr capsule Take 1 capsule (75 mg total) by mouth daily with breakfast. 02/09/19   [place] Crane, MD  zaleplon (SONATA) 10 MG capsule Take 1 capsule (10 mg total) by mouth at bedtime as needed for sleep. 02/09/19   [place] Crane, MD    Family History Family History  Problem Relation Age of Onset  . Lung cancer Mother        was a smoker  . Emphysema Mother   . Cancer Mother        Lung  . Alcohol abuse  Mother   . Depression Mother   . Heart disease Mother   . Drug abuse Sister   . Breast cancer Sister   . Emphysema Sister   . Bipolar disorder Sister   . Alcohol abuse Other   . Depression Other   . Bipolar disorder Other   . Heart disease Other   . Vision loss Other   . Alcohol abuse Father   . Mood Disorder Father   . Heart disease Maternal Grandmother   . Stroke Maternal Grandmother   . Esophageal cancer Maternal Aunt   . Colon cancer Neg Hx     Social History Social History   Tobacco Use  . Smoking status: Former Smoker    Packs/day: 1.00    Years: 40.00    Pack years: 40.00    Types: Cigarettes    Start date: 03/01/1975    Quit date: 08/11/2017    Years since quitting: 1.6  . Smokeless tobacco: Never Used  Substance Use Topics  . Alcohol use: No    Alcohol/week: 0.0 standard drinks  . Drug use: No     Allergies   Lovastatin, Paroxetine hcl, and Lamotrigine   Review of Systems Review of Systems  Constitutional: Negative for chills and fever.  HENT: Negative for ear pain and sore throat.   Eyes: Negative for pain and visual disturbance.  Respiratory: Negative for cough and shortness of breath.   Cardiovascular: Negative for chest pain and palpitations.  Gastrointestinal: Negative for abdominal pain, diarrhea, nausea and vomiting.  Genitourinary: Negative for dysuria and hematuria.  Musculoskeletal:  Positive for back pain. Negative for arthralgias.  Skin: Negative for color change and rash.  Neurological: Negative for seizures, syncope, weakness and numbness.  All other systems reviewed and are negative.    Physical Exam Triage Vital Signs ED Triage Vitals [03/30/19 1130]  Enc Vitals Group     BP      Pulse      Resp      Temp      Temp src      SpO2      Weight 153 lb (69.4 kg)     Height      Head Circumference      Peak Flow      Pain Score 6     Pain Loc      Pain Edu?      Excl. in [place]?    No data found.  Updated Vital Signs BP 118/75 (BP Location: Left Arm)   Pulse 96   Temp 98.5 F (36.9 C) (Oral)   Resp 16   Wt 153 lb (69.4 kg)   SpO2 95%   BMI 27.10 kg/m   Visual Acuity Right Eye Distance:   Left Eye Distance:   Bilateral Distance:    Right Eye Near:   Left Eye Near:    Bilateral Near:     Physical Exam Vitals and nursing note reviewed.  Constitutional:      General: She is not in acute distress.    Appearance: She is well-developed.  HENT:     Head: Normocephalic and atraumatic.     Mouth/Throat:     Mouth: Mucous membranes are moist.     Pharynx: Oropharynx is clear.  Eyes:     Conjunctiva/sclera: Conjunctivae normal.  Cardiovascular:     Rate and Rhythm: Normal rate and regular rhythm.     Heart sounds: No murmur.  Pulmonary:     Effort: Pulmonary effort is normal.  No respiratory distress.     Breath sounds: Normal breath sounds.  Abdominal:     General: Bowel sounds are normal.     Palpations: Abdomen is soft.     Tenderness: There is no abdominal tenderness. There is no guarding or rebound.  Musculoskeletal:        General: No swelling, tenderness, deformity or signs of injury. Normal range of motion.     Cervical back: Neck supple.  Skin:    General: Skin is warm and dry.     Capillary Refill: Capillary refill takes less than 2 seconds.     Findings: No bruising, erythema or rash.     Comments: Brown scaling on mid back.   No open wounds or erythema.    Neurological:     General: No focal deficit present.     Mental Status: She is alert and oriented to person, place, and time.     Sensory: No sensory deficit.     Motor: No weakness.     Gait: Gait normal.  Psychiatric:        Mood and Affect: Mood normal.        Behavior: Behavior normal.      UC Treatments / Results  Labs (all labs ordered are listed, but only abnormal results are displayed) Labs Reviewed - No data to display  EKG   Radiology No results found.  Procedures Procedures (including critical care time)  Medications Ordered in UC Medications - No data to display  Initial Impression / Assessment and Plan / UC Course  I have reviewed the triage vital signs and the nursing notes.  Pertinent labs & imaging results that were available during my care of the patient were reviewed by me and considered in my medical decision making (see chart for details).    Acute back pain.  Instructed patient to take Tylenol as needed for her pain (patient is not able to take NSAIDs).  Treating muscle spasm with Flexeril; precautions for drowsiness with this medication discussed with patient.  Instructed her to follow-up with her PCP if her pain is not improving.  Instructed her to go to the emergency department if she has increased pain or new symptoms such as dysuria, weakness, numbness, bowel/bladder incontinence, fever, chills, or other concerns.  Patient agrees to this plan of care.     Final Clinical Impressions(s) / UC Diagnoses   Final diagnoses:  Acute bilateral thoracic back pain     Discharge Instructions     Take the Tylenol as needed for your pain.  Take the muscle relaxer Flexeril as needed for muscle spasm; do not drive, operate machinery, or drink alcohol with this medication as it may make you drowsy.    Follow up with your primary care provider t if your pain is not improving.  Go to the emergency department if you have  worsening pain or develop new symptoms such as difficulty with urination, weakness, numbness, loss of control of your bladder or bowels, fever, or chills.         ED Prescriptions    Medication Sig Dispense Auth. Provider   cyclobenzaprine (FLEXERIL) 10 MG tablet Take 1 tablet (10 mg total) by mouth 2 (two) times daily as needed for muscle spasms. 20 tablet Sharion Balloon, NP     I have reviewed the PDMP during this encounter.   Sharion Balloon, NP 03/30/19 1152

## 2019-04-13 ENCOUNTER — Other Ambulatory Visit: Payer: Self-pay | Admitting: Physician Assistant

## 2019-04-14 ENCOUNTER — Ambulatory Visit (INDEPENDENT_AMBULATORY_CARE_PROVIDER_SITE_OTHER): Payer: Managed Care, Other (non HMO)

## 2019-04-14 ENCOUNTER — Other Ambulatory Visit: Payer: Self-pay

## 2019-04-14 DIAGNOSIS — I5032 Chronic diastolic (congestive) heart failure: Secondary | ICD-10-CM

## 2019-04-14 DIAGNOSIS — R0609 Other forms of dyspnea: Secondary | ICD-10-CM

## 2019-04-14 DIAGNOSIS — R0600 Dyspnea, unspecified: Secondary | ICD-10-CM | POA: Diagnosis not present

## 2019-04-16 ENCOUNTER — Ambulatory Visit: Payer: Managed Care, Other (non HMO) | Admitting: Internal Medicine

## 2019-04-16 NOTE — Progress Notes (Signed)
Follow-up Outpatient Visit Date: 04/17/2019  Primary Care Provider: [name], MD [address] [place] [address]  Chief Complaint: Shortness of breath and fatigue  HPI:  Katelyn Cooper is a 64 y.o. female with history of nonobstructive CAD, PAD status post left iliac stent, chronic HFpEF, diet-controlled DM 2, thoracic outlet syndrome status post bilateral rib resections (0000000), GERD complicated by esophageal stricture, HLD, and anxiety/depression, who presents for follow-up of shortness of breath.  She was seen on 03/13/2019 by Dr. Garen Lah for urgent evaluation of shortness of breath.  Her weight was noted to be up 4 pounds.  No medication changes were made.  She was referred for echo and myocardial perfusion stress test.  No significant ischemia was seen on the stress test.  Echo showed normal LVEF with grade 1 diastolic dysfunction.  There was no significant valvular disease.  Today, Ms. Rourke reports that she is a little less short of breath, though she is also less active.  She notes, however, that she gets mildly short of breath walking across the room at home.  This began a couple of months ago.  She denies chest pain, palpitations, and orthopnea.  She also notes that her legs feel "heavy and tired" at times when she is exerting herself and getting out of breath.  She denies bleeding.  --------------------------------------------------------------------------------------------------  Cardiovascular History & Procedures: Cardiovascular Problems:  Nonobstructive coronary artery disease  Peripheral vascular status post left iliac stent  HFpEF  Risk Factors:  Diabetes mellitus, hyperlipidemia, peripheral vascular disease  Cath/PCI:  L/RHC (11/08/2017): LMCA with minimal diffuse disease. LAD normal. 30% stenosis at first septal perforator. LCx with mild luminal irregularities. OM1 with 30% disease. RCA with sequential 30% proximal and mid  stenoses.  LHC (01/12/16): LMCA and LAD normal. There is a 40% stenosis in the proximal aspect of the large first septal perforator. There is 30% OM1 stenosis. 30% proximal and mid RCA lesions are also present. LVEDP mildly elevated at 15-20 mmHg.  CV Surgery:  Remote left iliac stent.  EP Procedures and Devices:  None.  Non-Invasive Evaluation(s):  TTE (04/14/2019): Normal LV size and wall thickness with LVEF 60-65%.  There is grade 1 diastolic dysfunction with elevated filling pressures.  Normal RV size and function.  No significant valvular abnormality.  Normal CVP.  Unable to assess PA pressure.  Pharmacologic MPI (03/30/2019): Low risk study withotu ischemia or scar.  Coronary artery calcification noted.  LVEF > 65%.  TTE (11/06/2017): Normal LV size and wall thickness. LVEF 50-55% with hypokinesis of the anteroseptal and anterior myocardium. Grade 2 diastolic dysfunction with elevated filling pressures. Mild MR. Normal RV size and function. Mild pulmonary hypertension (PASP 35 mmHg).  Pharmacologic myocardial perfusion stress test (12/23/15): Small, mild area of reversible defect involving the apical anterior and apical segments that could reflect ischemia or breast attenuation. LVEF 55-65%. Borderline TID was noted.  Transthoracic echocardiogram (12/28/15): Normal LV systolic and diastolic function (EF 0000000). No significant valvular abnormalities. Normal RV size and function.  Aortoiliac and lower extremity Dopplers:Patentstent in the left common iliac artery. No significant abnormalities. ABI right 1.05, left 1.03.  Recent CV Pertinent Labs: Lab Results  Component Value Date   CHOL 137 03/21/2018   HDL 50 03/21/2018   LDLCALC 59 03/21/2018   TRIG 140 03/21/2018   CHOLHDL 2.7 03/21/2018   INR 1.06 01/06/2016   BNP 731.0 (H) 11/05/2017   K 4.1 04/28/2018   MG 2.5 (H) 11/08/2017   BUN 9  04/28/2018   BUN 6 (L) 03/21/2018   CREATININE 0.90 04/28/2018    Past  medical and surgical history were reviewed and updated in EPIC.  Current Meds  Medication Sig  . albuterol (PROAIR HFA) 108 (90 Base) MCG/ACT inhaler Inhale 2 puffs into the lungs every 4 (four) hours as needed for wheezing or shortness of breath.  [name] aspirin EC 81 MG tablet Take 1 tablet (81 mg total) by mouth daily.  [name] atorvastatin (LIPITOR) 40 MG tablet TAKE 1 TABLET BY MOUTH (40MG  TOTAL) DAILY  . Cholecalciferol (VITAMIN D3) 5000 units TABS Take 1 tablet by mouth daily.   . cyclobenzaprine (FLEXERIL) 10 MG tablet Take 1 tablet (10 mg total) by mouth 2 (two) times daily as needed for muscle spasms.  . furosemide (LASIX) 20 MG tablet Take 1 tablet (20 mg total) by mouth daily. *NEEDS OFFICE VISIT FOR FURTHER REFILLS*  . glucose blood test strip ONETOUCH ULTRA MINI STRIPS AND LANCETS. Use one daily to check blood sugar.  . ibuprofen (ADVIL) 600 MG tablet Take 1 tablet (600 mg total) by mouth every 8 (eight) hours as needed.  [name] KLOR-CON M20 20 MEQ tablet TAKE 2 TABLETS TWICE A DAY  . lansoprazole (PREVACID) 15 MG capsule Take 15 mg by mouth daily at 6 (six) AM.  . metFORMIN (GLUCOPHAGE-XR) 500 MG 24 hr tablet TAKE 1 TABLET BY MOUTH EVERY EVENING  . metoprolol tartrate (LOPRESSOR) 25 MG tablet TAKE ONE-HALF (1/2) TABLET BY MOUTH 2 TIMES DAILY  . QUEtiapine (SEROQUEL) 100 MG tablet Take 1 tablet (100 mg total) by mouth at bedtime.  [name] venlafaxine XR (EFFEXOR-XR) 75 MG 24 hr capsule Take 1 capsule (75 mg total) by mouth daily with breakfast.  . zaleplon (SONATA) 10 MG capsule Take 1 capsule (10 mg total) by mouth at bedtime as needed for sleep.    Allergies: Lovastatin, Paroxetine hcl, and Lamotrigine  Social History   Tobacco Use  . Smoking status: Former Smoker    Packs/day: 1.00    Years: 40.00    Pack years: 40.00    Types: Cigarettes    Start date: 03/01/1975    Quit date: 08/11/2017    Years since quitting: 1.6  . Smokeless tobacco: Never Used  Substance Use Topics  . Alcohol use:  No    Alcohol/week: 0.0 standard drinks  . Drug use: No    Family History  Problem Relation Age of Onset  . Lung cancer Mother        was a smoker  . Emphysema Mother   . Cancer Mother        Lung  . Alcohol abuse Mother   . Depression Mother   . Heart disease Mother   . Drug abuse Sister   . Breast cancer Sister   . Emphysema Sister   . Bipolar disorder Sister   . Alcohol abuse Other   . Depression Other   . Bipolar disorder Other   . Heart disease Other   . Vision loss Other   . Alcohol abuse Father   . Mood Disorder Father   . Heart disease Maternal Grandmother   . Stroke Maternal Grandmother   . Esophageal cancer Maternal Aunt   . Colon cancer Neg Hx     Review of Systems: A 12-system review of systems was performed and was negative except as noted in the HPI.  --------------------------------------------------------------------------------------------------  Physical Exam: BP (!) 140/58 (BP Location: Left Arm, Patient Position: Sitting, Cuff Size: Normal)   Pulse [name])  109   Ht 5\' 3"  (1.6 m)   Wt 153 lb (69.4 kg)   SpO2 98%   BMI 27.10 kg/m   General:  NAD. HEENT: Pale conjunctiva noted.  No scleral icterus Facemask in place. Neck: Supple without lymphadenopathy, thyromegaly, JVD, or HJR. Lungs: Normal work of breathing. Clear to auscultation bilaterally without wheezes or crackles. Heart: Regular rate and rhythm with 2/6 systolic murmur.  No rubs or gallops. Abd: Bowel sounds present. Soft, NT/ND without hepatosplenomegaly Ext: No lower extremity edema. Radial, PT, and DP pulses are 2+ bilaterally. Skin: Warm and dry without rash.  EKG:  Sinus tachycardia with LBBB.  HR increased from prior tracing.  Otherwise, no significant change.  Lab Results  Component Value Date   WBC 8.7 11/06/2017   HGB 10.4 (L) 11/06/2017   HCT 31.1 (L) 11/06/2017   MCV 83.6 11/06/2017   PLT 181 11/06/2017    Lab Results  Component Value Date   NA 137 04/28/2018   K  4.1 04/28/2018   CL 104 04/28/2018   CO2 24 04/28/2018   BUN 9 04/28/2018   CREATININE 0.90 04/28/2018   GLUCOSE 158 (H) 04/28/2018   ALT 11 11/05/2017    Lab Results  Component Value Date   CHOL 137 03/21/2018   HDL 50 03/21/2018   LDLCALC 59 03/21/2018   TRIG 140 03/21/2018   CHOLHDL 2.7 03/21/2018    --------------------------------------------------------------------------------------------------  ASSESSMENT AND PLAN: Dyspnea on exertion and fatigue: Recent stress test and echo did not reveal clear etiology for symptoms.  I am concerned that worsening anemia or thyroid dysfunction could be contributing, particularly in the setting of sinus tachycardia today.  Labs were ordered by Dr. Caryn Section today (CBC and CMP).  We will see if a TSH can be added on as well.  I will increase metoprolol to 25 mg BID to see if better heart rate control helps with some of her symptoms.  Hypertension: BP borderline elevated today.  Increase metoprolol tartrate to 25 mg BID.  Chronic HFpEF: Ms. Debaca appears euvolemic on exam today but reports continued NYHA class III symptoms over the last few months.  I am concerned that a non-cardiac process may be contributing to her symptoms.  We will await labs ordered by Dr. Caryn Section as well as TSH.  Follow-up: Return to clinic in 1 month.   Nelva Bush, MD 04/17/2019 2:08 PM

## 2019-04-17 ENCOUNTER — Other Ambulatory Visit: Payer: Self-pay

## 2019-04-17 ENCOUNTER — Ambulatory Visit (INDEPENDENT_AMBULATORY_CARE_PROVIDER_SITE_OTHER): Payer: Managed Care, Other (non HMO) | Admitting: Family Medicine

## 2019-04-17 ENCOUNTER — Ambulatory Visit (INDEPENDENT_AMBULATORY_CARE_PROVIDER_SITE_OTHER): Payer: Managed Care, Other (non HMO) | Admitting: Internal Medicine

## 2019-04-17 ENCOUNTER — Encounter: Payer: Self-pay | Admitting: Family Medicine

## 2019-04-17 ENCOUNTER — Encounter: Payer: Self-pay | Admitting: Internal Medicine

## 2019-04-17 VITALS — BP 118/69 | HR 109 | Temp 97.6°F | Resp 16 | Ht 63.0 in | Wt 154.6 lb

## 2019-04-17 VITALS — BP 140/58 | HR 109 | Ht 63.0 in | Wt 153.0 lb

## 2019-04-17 DIAGNOSIS — I5032 Chronic diastolic (congestive) heart failure: Secondary | ICD-10-CM | POA: Diagnosis not present

## 2019-04-17 DIAGNOSIS — I2510 Atherosclerotic heart disease of native coronary artery without angina pectoris: Secondary | ICD-10-CM

## 2019-04-17 DIAGNOSIS — I10 Essential (primary) hypertension: Secondary | ICD-10-CM

## 2019-04-17 DIAGNOSIS — R0600 Dyspnea, unspecified: Secondary | ICD-10-CM

## 2019-04-17 DIAGNOSIS — R5383 Other fatigue: Secondary | ICD-10-CM

## 2019-04-17 DIAGNOSIS — E119 Type 2 diabetes mellitus without complications: Secondary | ICD-10-CM

## 2019-04-17 DIAGNOSIS — R0609 Other forms of dyspnea: Secondary | ICD-10-CM

## 2019-04-17 LAB — POCT UA - MICROALBUMIN: Microalbumin Ur, POC: 20 mg/L

## 2019-04-17 LAB — POCT GLYCOSYLATED HEMOGLOBIN (HGB A1C)
Est. average glucose Bld gHb Est-mCnc: 160
Hemoglobin A1C: 7.2 % — AB (ref 4.0–5.6)

## 2019-04-17 MED ORDER — METOPROLOL TARTRATE 25 MG PO TABS: 25.0000 mg | ORAL_TABLET | Freq: Two times a day (BID) | ORAL | 3 refills | Status: DC

## 2019-04-17 NOTE — Patient Instructions (Signed)
.   Please review the attached list of medications and notify my office if there are any errors.   . Please bring all of your medications to every appointment so we can make sure that our medication list is the same as yours.   

## 2019-04-17 NOTE — Progress Notes (Signed)
Patient: Katelyn Cooper Female    DOB: Jul 08, 1955   64 y.o.   MRN: [id number] Visit Date: 04/17/2019  Today's Provider: Lelon Huh, MD   Chief Complaint  Patient presents with  . Diabetes  . Hypertension  . Hyperlipidemia   Subjective:     HPI  Diabetes Mellitus Type II, Follow-up:   Lab Results  Component Value Date   HGBA1C 7.2 (A) 04/17/2019   HGBA1C 7.0 (A) 09/23/2018   HGBA1C 7.2 (A) 05/19/2018   Last seen for diabetes 7 months ago.  Management since then includes none. She reports good compliance with treatment. She is not having side effects.  Current symptoms include none and have been stable. Home blood sugar records: fasting range: not being checked  Episodes of hypoglycemia? no   Current Insulin Regimen: none Most Recent Eye Exam: [facility], will schedule Weight trend: stable Prior visit with dietician: no Current diet: not asked Current exercise: none  ------------------------------------------------------------------------   Hypertension, follow-up:  BP Readings from Last 3 Encounters:  04/17/19 118/69  03/30/19 118/75  03/13/19 125/64    She was last seen for hypertension 7 months ago.  BP at that visit was 109/68. Management since that visit includes no changes.She reports excellent compliance with treatment. She is not having side effects.  She is not exercising. She is adherent to low salt diet.   Outside blood pressures are stable. She is experiencing shortness of breath.  Patient denies chest pain and lower extremity edema.   Cardiovascular risk factors include diabetes mellitus, dyslipidemia and hypertension.  Use of agents associated with hypertension: NSAIDS.   ------------------------------------------------------------------------    Lipid/Cholesterol, Follow-up:   Last seen for this 7 months ago.  Management since that visit includes no changes.  Last Lipid Panel:    Component Value Date/Time   CHOL  137 03/21/2018 1500   TRIG 140 03/21/2018 1500   HDL 50 03/21/2018 1500   CHOLHDL 2.7 03/21/2018 1500   LDLCALC 59 03/21/2018 1500    She reports excellent compliance with treatment. She is not having side effects.   Wt Readings from Last 3 Encounters:  04/17/19 154 lb 9.6 oz (70.1 kg)  03/30/19 153 lb (69.4 kg)  03/13/19 155 lb (70.3 kg)    ------------------------------------------------------------------------  Allergies  Allergen Reactions  . Lovastatin     depression  . Paroxetine Hcl     itching  . Lamotrigine Rash     Current Outpatient Medications:  .  albuterol (PROAIR HFA) 108 (90 Base) MCG/ACT inhaler, Inhale 2 puffs into the lungs every 4 (four) hours as needed for wheezing or shortness of breath., Disp: 1 Inhaler, Rfl: 2 .  aspirin EC 81 MG tablet, Take 1 tablet (81 mg total) by mouth daily., Disp: , Rfl:  .  atorvastatin (LIPITOR) 40 MG tablet, TAKE 1 TABLET BY MOUTH (40MG  TOTAL) DAILY, Disp: 90 tablet, Rfl: 3 .  Cholecalciferol (VITAMIN D3) 5000 units TABS, Take 1 tablet by mouth daily. , Disp: , Rfl: 0 .  cyclobenzaprine (FLEXERIL) 10 MG tablet, Take 1 tablet (10 mg total) by mouth 2 (two) times daily as needed for muscle spasms., Disp: 20 tablet, Rfl: 0 .  furosemide (LASIX) 20 MG tablet, Take 1 tablet (20 mg total) by mouth daily. *NEEDS OFFICE VISIT FOR FURTHER REFILLS*, Disp: 90 tablet, Rfl: 0 .  glucose blood test strip, ONETOUCH ULTRA MINI STRIPS AND LANCETS. Use one daily to check blood sugar., Disp: 100 each, Rfl: 3 .  ibuprofen (ADVIL) 600 MG tablet, Take 1 tablet (600 mg total) by mouth every 8 (eight) hours as needed., Disp: 30 tablet, Rfl: 0 .  KLOR-CON M20 20 MEQ tablet, TAKE 2 TABLETS TWICE A DAY, Disp: 120 tablet, Rfl: 0 .  lansoprazole (PREVACID) 15 MG capsule, Take 15 mg by mouth daily at 6 (six) AM., Disp: , Rfl:  .  metFORMIN (GLUCOPHAGE-XR) 500 MG 24 hr tablet, TAKE 1 TABLET BY MOUTH EVERY EVENING, Disp: 90 tablet, Rfl: 3 .  metoprolol  tartrate (LOPRESSOR) 25 MG tablet, TAKE ONE-HALF (1/2) TABLET BY MOUTH 2 TIMES DAILY, Disp: 90 tablet, Rfl: 3 .  QUEtiapine (SEROQUEL) 100 MG tablet, Take 1 tablet (100 mg total) by mouth at bedtime., Disp: 90 tablet, Rfl: 1 .  venlafaxine XR (EFFEXOR-XR) 75 MG 24 hr capsule, Take 1 capsule (75 mg total) by mouth daily with breakfast., Disp: 90 capsule, Rfl: 1 .  zaleplon (SONATA) 10 MG capsule, Take 1 capsule (10 mg total) by mouth at bedtime as needed for sleep., Disp: 30 capsule, Rfl: 2  Review of Systems  Constitutional: Negative for appetite change, chills, fatigue and fever.  HENT: Negative.   Eyes: Negative.   Respiratory: Positive for shortness of breath and wheezing. Negative for cough and chest tightness.   Cardiovascular: Negative for chest pain, palpitations and leg swelling.  Gastrointestinal: Negative for abdominal pain, nausea and vomiting.  Endocrine: Negative.   Neurological: Negative for dizziness and weakness.    Social History   Tobacco Use  . Smoking status: Former Smoker    Packs/day: 1.00    Years: 40.00    Pack years: 40.00    Types: Cigarettes    Start date: 03/01/1975    Quit date: 08/11/2017    Years since quitting: 1.6  . Smokeless tobacco: Never Used  Substance Use Topics  . Alcohol use: No    Alcohol/week: 0.0 standard drinks      Objective:   BP 118/69 (BP Location: Right Arm, Patient Position: Sitting, Cuff Size: Large)   Pulse (!) 109   Temp 97.6 F (36.4 C) (Temporal)   Resp 16   Ht 5\' 3"  (1.6 m)   Wt 154 lb 9.6 oz (70.1 kg)   SpO2 98%   BMI 27.39 kg/m  Vitals:   04/17/19 0949  BP: 118/69  Pulse: (!) 109  Resp: 16  Temp: 97.6 F (36.4 C)  TempSrc: Temporal  SpO2: 98%  Weight: 154 lb 9.6 oz (70.1 kg)  Height: 5\' 3"  (1.6 m)  Body mass index is 27.39 kg/m.   Physical Exam  General appearance: Well developed, well nourished female, cooperative and in no acute distress Head: Normocephalic, without obvious abnormality,  atraumatic Respiratory: Respirations even and unlabored, normal respiratory rate Extremities: All extremities are intact.  Skin: Skin color, texture, turgor normal. No rashes seen  Psych: Appropriate mood and affect. Neurologic: Mental status: Alert, oriented to person, place, and time, thought content appropriate.   Results for orders placed or performed in visit on 04/17/19  POCT HgB A1C  Result Value Ref Range   Hemoglobin A1C 7.2 (A) 4.0 - 5.6 %   Est. average glucose Bld gHb Est-mCnc 160        Assessment & Plan    1. Controlled type 2 diabetes mellitus without complication, without long-term current use of insulin (HCC) Stable, Continue current medications.    2. Essential hypertension Well controlled.  Continue current medications.   - Comprehensive metabolic panel - CBC  3. Coronary artery  disease involving native coronary artery of native heart without angina pectoris Asymptomatic. Compliant with medication.  Continue aggressive risk factor modification.   - Lipid panel (fasting)  4. . Dyspnea, unspecified type She is in midst of cardiac work up by Dr. Saunders Revel. Is planning on pulmonary referral if cardiac function is normal by patient report.  - CBC  Follow up 6 months.     Lelon Huh, MD  [facility]

## 2019-04-17 NOTE — Patient Instructions (Signed)
Medication Instructions:  1- INCREASE Metoprolol to 25 mg by mouth two times a day.  *If you need a refill on your cardiac medications before your next appointment, please call your pharmacy*  Lab Work: Add on TSH to labs drawn at PCP.  If you have labs (blood work) drawn today and your tests are completely normal, you will receive your results only by: [name] MyChart Message (if you have MyChart) OR . A paper copy in the mail If you have any lab test that is abnormal or we need to change your treatment, we will call you to review the results.  Testing/Procedures: none  Follow-Up: At [facility], you and your health needs are our priority.  As part of our continuing mission to provide you with exceptional heart care, we have created designated Provider Care Teams.  These Care Teams include your primary Cardiologist (physician) and Advanced Practice Providers (APPs -  Physician Assistants and Nurse Practitioners) who all work together to provide you with the care you need, when you need it.  Your next appointment:   1 month(s)  The format for your next appointment:   In Person  Provider:    You may see Nelva Bush, MD or one of the following Advanced Practice Providers on your designated Care Team:    Murray Hodgkins, NP  Christell Faith, PA-C  Marrianne Mood, PA-C

## 2019-04-18 LAB — COMPREHENSIVE METABOLIC PANEL
ALT: 19 IU/L (ref 0–32)
AST: 27 IU/L (ref 0–40)
Albumin/Globulin Ratio: 1.5 (ref 1.2–2.2)
Albumin: 4.6 g/dL (ref 3.8–4.8)
Alkaline Phosphatase: 179 IU/L — ABNORMAL HIGH (ref 39–117)
BUN/Creatinine Ratio: 7 — ABNORMAL LOW (ref 12–28)
BUN: 7 mg/dL — ABNORMAL LOW (ref 8–27)
Bilirubin Total: 0.3 mg/dL (ref 0.0–1.2)
CO2: 21 mmol/L (ref 20–29)
Calcium: 9.2 mg/dL (ref 8.7–10.3)
Chloride: 101 mmol/L (ref 96–106)
Creatinine, Ser: 0.95 mg/dL (ref 0.57–1.00)
GFR calc Af Amer: 73 mL/min/{1.73_m2} (ref >59)
GFR calc non Af Amer: 63 mL/min/{1.73_m2} (ref >59)
Globulin, Total: 3.1 g/dL (ref 1.5–4.5)
Glucose: 192 mg/dL — ABNORMAL HIGH (ref 65–99)
Potassium: 3.3 mmol/L — ABNORMAL LOW (ref 3.5–5.2)
Sodium: 141 mmol/L (ref 134–144)
Total Protein: 7.7 g/dL (ref 6.0–8.5)

## 2019-04-18 LAB — CBC
Hematocrit: 28.6 % — ABNORMAL LOW (ref 34.0–46.6)
Hemoglobin: 8.7 g/dL — ABNORMAL LOW (ref 11.1–15.9)
MCH: 22.9 pg — ABNORMAL LOW (ref 26.6–33.0)
MCHC: 30.4 g/dL — ABNORMAL LOW (ref 31.5–35.7)
MCV: 75 fL — ABNORMAL LOW (ref 79–97)
Platelets: 210 10*3/uL (ref 150–450)
RBC: 3.8 x10E6/uL (ref 3.77–5.28)
RDW: 16.5 % — ABNORMAL HIGH (ref 11.7–15.4)
WBC: 7.4 10*3/uL (ref 3.4–10.8)

## 2019-04-18 LAB — LIPID PANEL
Chol/HDL Ratio: 2.9 ratio (ref 0.0–4.4)
Cholesterol, Total: 160 mg/dL (ref 100–199)
HDL: 55 mg/dL (ref >39)
LDL Chol Calc (NIH): 76 mg/dL (ref 0–99)
Triglycerides: 169 mg/dL — ABNORMAL HIGH (ref 0–149)
VLDL Cholesterol Cal: 29 mg/dL (ref 5–40)

## 2019-04-20 ENCOUNTER — Telehealth: Payer: Self-pay | Admitting: Internal Medicine

## 2019-04-20 DIAGNOSIS — G4733 Obstructive sleep apnea (adult) (pediatric): Secondary | ICD-10-CM

## 2019-04-20 NOTE — Telephone Encounter (Signed)
Pt called [facility] to order new Cpap supplies. They told her that we need to send a new prescription over with all the diagnostic information before they will give her new supplies. Cb#: [id number]   Fax Number: [phone]

## 2019-04-20 NOTE — Telephone Encounter (Signed)
Called and spoke to pt, who is requesting that a order for cpap supplies be sent to [facility].   Dr. Mortimer Fries please advise. Thanks

## 2019-04-21 NOTE — Telephone Encounter (Signed)
Please order accordingly

## 2019-04-21 NOTE — Telephone Encounter (Signed)
Order has been placed to [facility] as requested.  Pt is aware and voiced her understanding.  Nothing further is needed.

## 2019-04-23 ENCOUNTER — Telehealth: Payer: Self-pay | Admitting: *Deleted

## 2019-04-23 ENCOUNTER — Telehealth: Payer: Self-pay | Admitting: Internal Medicine

## 2019-04-23 NOTE — Telephone Encounter (Signed)
Katelyn Cooper, can you help with this.  Order was placed on 04/21/2019.

## 2019-04-23 NOTE — Telephone Encounter (Signed)
Patient recently had lab work at PCP. Saw Dr End on 04/17/19 and wanted to see if we could add on TSH. Called [facility] and they will see if they can add on the TSH. We should receive confirmation via fax either way.

## 2019-04-23 NOTE — Telephone Encounter (Signed)
Order faxed to [facility] for CPAP Supplies to fax number provided [phone].  Confirmation received.  Attempted to call back to confirm [facility] received order however, this number was for Prior Authorization and I did not have caller's name or extension number.    Confirmation received that order was sent. [name] Nothing else needed at this time. [name]

## 2019-04-27 ENCOUNTER — Ambulatory Visit (INDEPENDENT_AMBULATORY_CARE_PROVIDER_SITE_OTHER): Payer: Managed Care, Other (non HMO) | Admitting: Family Medicine

## 2019-04-27 ENCOUNTER — Other Ambulatory Visit: Payer: Self-pay

## 2019-04-27 ENCOUNTER — Encounter: Payer: Self-pay | Admitting: Family Medicine

## 2019-04-27 VITALS — Temp 96.9°F

## 2019-04-27 DIAGNOSIS — J069 Acute upper respiratory infection, unspecified: Secondary | ICD-10-CM | POA: Diagnosis not present

## 2019-04-27 LAB — SPECIMEN STATUS REPORT

## 2019-04-27 LAB — TSH: TSH: 1.55 u[IU]/mL (ref 0.450–4.500)

## 2019-04-27 NOTE — Telephone Encounter (Signed)
Routing to Dr End to review TSH as requested.

## 2019-04-27 NOTE — Progress Notes (Signed)
Patient: Katelyn Cooper Female    DOB: Aug 06, 1955   64 y.o.   MRN: [id number] Visit Date: 04/27/2019  Today's Provider: Lelon Huh, MD   Chief Complaint  Patient presents with  . Cough   Subjective:    Virtual Visit via Telephone Note  I connected with Katelyn Cooper on 04/27/19 at  2:00 PM EST by telephone and verified that I am speaking with the correct person using two identifiers.  Location: Patient: home Provider: [facility]   I discussed the limitations, risks, security and privacy concerns of performing an evaluation and management service by telephone and the availability of in person appointments. I also discussed with the patient that there may be a patient responsible charge related to this service. The patient expressed understanding and agreed to proceed.   Cough This is a new problem. Episode onset: 3 days ago. The cough is non-productive. Associated symptoms include shortness of breath. Pertinent negatives include no chest pain, chills or fever. Treatments tried: Tylenol and Ibuprofen. The treatment provided mild relief.  No head or chest congestion, feels like irritation in throat, but also from deep in chest. No loss of taste or smell. Hasn't had to use inhaler. No known Covid exposure.   Allergies  Allergen Reactions  . Lovastatin     depression  . Paroxetine Hcl     itching  . Lamotrigine Rash     Current Outpatient Medications:  .  aspirin EC 81 MG tablet, Take 1 tablet (81 mg total) by mouth daily., Disp: , Rfl:  .  atorvastatin (LIPITOR) 40 MG tablet, TAKE 1 TABLET BY MOUTH (40MG  TOTAL) DAILY, Disp: 90 tablet, Rfl: 3 .  Cholecalciferol (VITAMIN D3) 5000 units TABS, Take 1 tablet by mouth daily. , Disp: , Rfl: 0 .  furosemide (LASIX) 20 MG tablet, Take 1 tablet (20 mg total) by mouth daily. *NEEDS OFFICE VISIT FOR FURTHER REFILLS*, Disp: 90 tablet, Rfl: 0 .  ibuprofen (ADVIL) 600 MG tablet, Take 1 tablet (600 mg total) by mouth every 8 (eight)  hours as needed., Disp: 30 tablet, Rfl: 0 .  KLOR-CON M20 20 MEQ tablet, TAKE 2 TABLETS TWICE A DAY, Disp: 120 tablet, Rfl: 0 .  lansoprazole (PREVACID) 15 MG capsule, Take 15 mg by mouth daily at 6 (six) AM., Disp: , Rfl:  .  metFORMIN (GLUCOPHAGE-XR) 500 MG 24 hr tablet, TAKE 1 TABLET BY MOUTH EVERY EVENING, Disp: 90 tablet, Rfl: 3 .  metoprolol tartrate (LOPRESSOR) 25 MG tablet, Take 1 tablet (25 mg total) by mouth 2 (two) times daily., Disp: 180 tablet, Rfl: 3 .  QUEtiapine (SEROQUEL) 100 MG tablet, Take 1 tablet (100 mg total) by mouth at bedtime., Disp: 90 tablet, Rfl: 1 .  venlafaxine XR (EFFEXOR-XR) 75 MG 24 hr capsule, Take 1 capsule (75 mg total) by mouth daily with breakfast., Disp: 90 capsule, Rfl: 1 .  zaleplon (SONATA) 10 MG capsule, Take 1 capsule (10 mg total) by mouth at bedtime as needed for sleep., Disp: 30 capsule, Rfl: 2 .  albuterol (PROAIR HFA) 108 (90 Base) MCG/ACT inhaler, Inhale 2 puffs into the lungs every 4 (four) hours as needed for wheezing or shortness of breath. (Patient not taking: Reported on 04/27/2019), Disp: 1 Inhaler, Rfl: 2 .  cyclobenzaprine (FLEXERIL) 10 MG tablet, Take 1 tablet (10 mg total) by mouth 2 (two) times daily as needed for muscle spasms. (Patient not taking: Reported on 04/27/2019), Disp: 20 tablet, Rfl: 0 .  glucose  blood test strip, ONETOUCH ULTRA MINI STRIPS AND LANCETS. Use one daily to check blood sugar., Disp: 100 each, Rfl: 3  Review of Systems  Constitutional: Positive for fatigue. Negative for appetite change, chills and fever.  Respiratory: Positive for cough and shortness of breath. Negative for chest tightness.   Cardiovascular: Negative for chest pain and palpitations.  Gastrointestinal: Negative for abdominal pain, nausea and vomiting.  Neurological: Negative for dizziness and weakness.    Social History   Tobacco Use  . Smoking status: Former Smoker    Packs/day: 1.00    Years: 40.00    Pack years: 40.00    Types: Cigarettes     Start date: 03/01/1975    Quit date: 08/11/2017    Years since quitting: 1.7  . Smokeless tobacco: Never Used  Substance Use Topics  . Alcohol use: No    Alcohol/week: 0.0 standard drinks      Objective:   Temp (!) 96.9 F (36.1 C) (Temporal)    Physical Exam  Awake, alert, oriented x 3. In no apparent distress  No results found for any visits on 04/27/19.     Assessment & Plan    1. Upper respiratory tract infection, unspecified type Counseled on procedures for Covid-testing and symptomatic treatment of viral URI. Go to ER if any worsening dyspnea or other new sx.    I discussed the assessment and treatment plan with the patient. The patient was provided an opportunity to ask questions and all were answered. The patient agreed with the plan and demonstrated an understanding of the instructions.   The patient was advised to call back or seek an in-person evaluation if the symptoms worsen or if the condition fails to improve as anticipated.  I provided 11 minutes of non-face-to-face time during this encounter.      Lelon Huh, MD  [facility]

## 2019-04-28 ENCOUNTER — Ambulatory Visit: Payer: Managed Care, Other (non HMO) | Attending: Internal Medicine

## 2019-04-28 DIAGNOSIS — Z20822 Contact with and (suspected) exposure to covid-19: Secondary | ICD-10-CM

## 2019-04-28 NOTE — Telephone Encounter (Signed)
TSH normal.  I will defer further management to Dr. Caryn Section.  Nelva Bush, MD [facility]

## 2019-04-28 NOTE — Telephone Encounter (Signed)
Patient advised of labs and verbally voiced understanding.

## 2019-04-28 NOTE — Telephone Encounter (Signed)
Also, labs showed that she is getting anemic. She needs to start taking OTC iron sulfate 325mg  daily. Will need to have her come in to check cbc and iron levels in 3-4 weeks. Will also need to check OC lyte. If her Covid test is negative she can come in to pick up OC lyte card.

## 2019-04-29 ENCOUNTER — Encounter: Payer: Self-pay | Admitting: Family Medicine

## 2019-04-29 LAB — NOVEL CORONAVIRUS, NAA: SARS-CoV-2, NAA: NOT DETECTED

## 2019-04-30 ENCOUNTER — Ambulatory Visit
Admission: RE | Admit: 2019-04-30 | Discharge: 2019-04-30 | Disposition: A | Discharge status: Home / Self Care | Payer: Managed Care, Other (non HMO) | Source: Ambulatory Visit | Attending: Physician Assistant | Admitting: Physician Assistant

## 2019-04-30 ENCOUNTER — Ambulatory Visit: Payer: Self-pay

## 2019-04-30 ENCOUNTER — Ambulatory Visit (INDEPENDENT_AMBULATORY_CARE_PROVIDER_SITE_OTHER): Payer: Managed Care, Other (non HMO) | Admitting: Physician Assistant

## 2019-04-30 DIAGNOSIS — R059 Cough, unspecified: Secondary | ICD-10-CM

## 2019-04-30 DIAGNOSIS — R05 Cough: Secondary | ICD-10-CM

## 2019-04-30 DIAGNOSIS — R0602 Shortness of breath: Secondary | ICD-10-CM

## 2019-04-30 MED ORDER — DOXYCYCLINE HYCLATE 100 MG PO TABS: 100.0000 mg | ORAL_TABLET | Freq: Two times a day (BID) | ORAL | 0 refills | Status: AC

## 2019-04-30 MED ORDER — PREDNISONE 20 MG PO TABS: 20.0000 mg | ORAL_TABLET | Freq: Every day | ORAL | 0 refills | Status: AC

## 2019-04-30 NOTE — Telephone Encounter (Signed)
She does also have heart conditions. If we can call her and make sure she is not Short of breath at rest right now or any other red flag symptoms where she would need to go to the ED, I think she would be a good person to schedule on the respiratory clinic tonight if she is willing.

## 2019-04-30 NOTE — Telephone Encounter (Signed)
Patient called stating that she has had a phone visit with [name] for Cough and SOB.  She states he sent her for COVI-19 testing and her test was negative.  She states her cough and SOB have not improved.  She states that she is SOB at rest and needs to sleep on pillows at night.She has back and chest pain from coughing.  She has Hx of CHF.  She has no fever. Per protocol patient scheduled for virtual visit today. DT is complete. Care advice read to patient.  She verbalized understanding of all information.  She will wait for phone call.  She states last week she was unable to do more that the phone call. Cell phone would not connect.  Reason for Disposition . [1] Longstanding difficulty breathing (e.g., CHF, COPD, emphysema) AND [2] WORSE than normal  Answer Assessment - Initial Assessment Questions 1. RESPIRATORY STATUS: "Describe your breathing?" (e.g., wheezing, shortness of breath, unable to speak, severe coughing)      SOB 2. ONSET: "When did this breathing problem begin?"     04/24/19 3. PATTERN "Does the difficult breathing come and go, or has it been constant since it started?"      constant 4. SEVERITY: "How bad is your breathing?" (e.g., mild, moderate, severe)    - MILD: No SOB at rest, mild SOB with walking, speaks normally in sentences, can lay down, no retractions, pulse < 100.    - MODERATE: SOB at rest, SOB with minimal exertion and prefers to sit, cannot lie down flat, speaks in phrases, mild retractions, audible wheezing, pulse 100-120.    - SEVERE: Very SOB at rest, speaks in single words, struggling to breathe, sitting hunched forward, retractions, pulse > 120     Moderate unable to wear CPAP 5. RECURRENT SYMPTOM: "Have you had difficulty breathing before?" If so, ask: "When was the last time?" and "What happened that time?"     Yes bronchitis 6. CARDIAC HISTORY: "Do you have any history of heart disease?" (e.g., heart attack, angina, bypass surgery, angioplasty)       CHF 7. LUNG HISTORY: "Do you have any history of lung disease?"  (e.g., pulmonary embolus, asthma, emphysema)    8. CAUSE: "What do you think is causing the breathing problem?"      unsure 9. OTHER SYMPTOMS: "Do you have any other symptoms? (e.g., dizziness, runny nose, cough, chest pain, fever)    Cough,no fever, back hurts with cough and now chest muscles hurt 10. PREGNANCY: "Is there any chance you are pregnant?" "When was your last menstrual period?"      N/A 11. TRAVEL: "Have you traveled out of the country in the last month?" (e.g., travel history, exposures)      no  Protocols used: BREATHING DIFFICULTY-A-AH

## 2019-04-30 NOTE — Progress Notes (Signed)
Patient: Katelyn Cooper Female    DOB: 1956-03-21   64 y.o.   MRN: [id number] Visit Date: 04/30/2019  Today's Provider: Trinna Post, PA-C   Chief Complaint  Patient presents with  . Cough   Subjective:    Virtual Visit via Telephone Note  I connected with Katelyn Cooper on 04/30/19 at 11:20 AM EST by telephone and verified that I am speaking with the correct person using two identifiers.  Location: Patient: Home Provider: Office   I discussed the limitations, risks, security and privacy concerns of performing an evaluation and management service by telephone and the availability of in person appointments. I also discussed with the patient that there may be a patient responsible charge related to this service. The patient expressed understanding and agreed to proceed.  Cough This is a new problem. The current episode started 1 to 4 weeks ago. The problem has been gradually worsening. The cough is non-productive. Associated symptoms include chest pain, shortness of breath and wheezing. Pertinent negatives include no chills, ear pain, fever, headaches, nasal congestion, postnasal drip, rhinorrhea, sore throat or sweats. The symptoms are aggravated by lying down. She has tried nothing for the symptoms. The treatment provided no relief. Her past medical history is significant for bronchitis. There is no history of asthma, bronchiectasis, COPD, emphysema, environmental allergies or pneumonia.   Patient is a 64 y/o woman with history of CHF, COPD presenting today for cough and SOB that has been worsening over the past three days. At baseline, patient reports she would have SOB when she was active. She is followed by Dr. Saunders Revel for grade I diastolic dysfunction. Recent ECHO on 04/23/2019 shows EF 60%, Grade I dysfunction but otherwise normal. Her cardiologist noted that she was having NYHA III symptoms and was concerned for non-cardiac cause of symptoms.   Currently she reports she is  having SOB quicker with exertion and she is having more SOB at night. Denies SOB at rest. Denies ankle swelling. Current weight today is steady, she report a 0.8 lb increase. She is currently Lasix 20 mg QD and this is how she normally takes it. She is having to use two pillows to sleep at night for the past few nights. She does have an albuterol inhaler that she has not used for current symptoms. Denies productive cough.   Of note, patient's most recent CBC showed microcytic anemia with hemoglobin 8.7 on 04/17/2019. She has taken three days of iron supplements. She has been instructed to complete an OC light.   Wt Readings from Last 3 Encounters:  04/17/19 153 lb (69.4 kg)  04/17/19 154 lb 9.6 oz (70.1 kg)  03/30/19 153 lb (69.4 kg)     Allergies  Allergen Reactions  . Lovastatin     depression  . Paroxetine Hcl     itching  . Lamotrigine Rash     Current Outpatient Medications:  .  aspirin EC 81 MG tablet, Take 1 tablet (81 mg total) by mouth daily., Disp: , Rfl:  .  atorvastatin (LIPITOR) 40 MG tablet, TAKE 1 TABLET BY MOUTH (40MG  TOTAL) DAILY, Disp: 90 tablet, Rfl: 3 .  Cholecalciferol (VITAMIN D3) 5000 units TABS, Take 1 tablet by mouth daily. , Disp: , Rfl: 0 .  furosemide (LASIX) 20 MG tablet, Take 1 tablet (20 mg total) by mouth daily. *NEEDS OFFICE VISIT FOR FURTHER REFILLS*, Disp: 90 tablet, Rfl: 0 .  glucose blood test strip, ONETOUCH ULTRA MINI STRIPS AND  LANCETS. Use one daily to check blood sugar., Disp: 100 each, Rfl: 3 .  ibuprofen (ADVIL) 600 MG tablet, Take 1 tablet (600 mg total) by mouth every 8 (eight) hours as needed., Disp: 30 tablet, Rfl: 0 .  KLOR-CON M20 20 MEQ tablet, TAKE 2 TABLETS TWICE A DAY, Disp: 120 tablet, Rfl: 0 .  lansoprazole (PREVACID) 15 MG capsule, Take 15 mg by mouth daily at 6 (six) AM., Disp: , Rfl:  .  metFORMIN (GLUCOPHAGE-XR) 500 MG 24 hr tablet, TAKE 1 TABLET BY MOUTH EVERY EVENING, Disp: 90 tablet, Rfl: 3 .  metoprolol tartrate (LOPRESSOR)  25 MG tablet, Take 1 tablet (25 mg total) by mouth 2 (two) times daily., Disp: 180 tablet, Rfl: 3 .  QUEtiapine (SEROQUEL) 100 MG tablet, Take 1 tablet (100 mg total) by mouth at bedtime., Disp: 90 tablet, Rfl: 1 .  venlafaxine XR (EFFEXOR-XR) 75 MG 24 hr capsule, Take 1 capsule (75 mg total) by mouth daily with breakfast., Disp: 90 capsule, Rfl: 1 .  zaleplon (SONATA) 10 MG capsule, Take 1 capsule (10 mg total) by mouth at bedtime as needed for sleep., Disp: 30 capsule, Rfl: 2 .  albuterol (PROAIR HFA) 108 (90 Base) MCG/ACT inhaler, Inhale 2 puffs into the lungs every 4 (four) hours as needed for wheezing or shortness of breath. (Patient not taking: Reported on 04/27/2019), Disp: 1 Inhaler, Rfl: 2 .  cyclobenzaprine (FLEXERIL) 10 MG tablet, Take 1 tablet (10 mg total) by mouth 2 (two) times daily as needed for muscle spasms. (Patient not taking: Reported on 04/27/2019), Disp: 20 tablet, Rfl: 0 .  doxycycline (VIBRA-TABS) 100 MG tablet, Take 1 tablet (100 mg total) by mouth 2 (two) times daily for 7 days., Disp: 14 tablet, Rfl: 0 .  predniSONE (DELTASONE) 20 MG tablet, Take 1 tablet (20 mg total) by mouth daily with breakfast for 5 days., Disp: 5 tablet, Rfl: 0  Review of Systems  Constitutional: Negative for chills and fever.  HENT: Negative for ear pain, postnasal drip, rhinorrhea and sore throat.   Respiratory: Positive for cough, shortness of breath and wheezing.   Cardiovascular: Positive for chest pain.  Allergic/Immunologic: Negative for environmental allergies.  Neurological: Negative for headaches.    Social History   Tobacco Use  . Smoking status: Former Smoker    Packs/day: 1.00    Years: 40.00    Pack years: 40.00    Types: Cigarettes    Start date: 03/01/1975    Quit date: 08/11/2017    Years since quitting: 1.7  . Smokeless tobacco: Never Used  Substance Use Topics  . Alcohol use: No    Alcohol/week: 0.0 standard drinks      Objective:   There were no vitals taken for  this visit. There were no vitals filed for this visit.There is no height or weight on file to calculate BMI.   Physical Exam   No results found for any visits on 04/30/19.     Assessment & Plan    1. Cough  Patient with CHF, COPD and iron deficiency anemia with two pillow orthopnea, cough and worsening SOB. She has received a negative COVID test on 04/27/2019.   We will treat with doxycycline and prednisone as below.   CXR showed bilateral interstitial edema. Have reviewed CXR personally and agree with findings. I have advised her to take 20 mg Lasix additional for a total of 40 mg Lasix daily for the next three days and monitor her symptoms.  - DG Chest 2  View; Future - doxycycline (VIBRA-TABS) 100 MG tablet; Take 1 tablet (100 mg total) by mouth 2 (two) times daily for 7 days.  Dispense: 14 tablet; Refill: 0 - predniSONE (DELTASONE) 20 MG tablet; Take 1 tablet (20 mg total) by mouth daily with breakfast for 5 days.  Dispense: 5 tablet; Refill: 0  2. SOB (shortness of breath)  - DG Chest 2 View; Future - doxycycline (VIBRA-TABS) 100 MG tablet; Take 1 tablet (100 mg total) by mouth 2 (two) times daily for 7 days.  Dispense: 14 tablet; Refill: 0 - predniSONE (DELTASONE) 20 MG tablet; Take 1 tablet (20 mg total) by mouth daily with breakfast for 5 days.  Dispense: 5 tablet; Refill: 0  I discussed the assessment and treatment plan with the patient. The patient was provided an opportunity to ask questions and all were answered. The patient agreed with the plan and demonstrated an understanding of the instructions.   The patient was advised to call back or seek an in-person evaluation if the symptoms worsen or if the condition fails to improve as anticipated.  I provided 25 minutes of non-face-to-face time during this encounter.  The entirety of the information documented in the History of Present Illness, Review of Systems and Physical Exam were personally obtained by me. Portions of  this information were initially documented by [facility] and reviewed by me for thoroughness and accuracy.       Trinna Post, PA-C  [facility]

## 2019-04-30 NOTE — Patient Instructions (Signed)
Heart Failure Action Plan A heart failure action plan helps you understand what to do when you have symptoms of heart failure. Follow the plan that was created by you and your health care provider. Review your plan each time you visit your health care provider. Red zone These signs and symptoms mean you should get medical help right away:  You have trouble breathing when resting.  You have a dry cough that is getting worse.  You have swelling or pain in your legs or abdomen that is getting worse.  You suddenly gain more than 2-3 lb (0.9-1.4 kg) in a day, or more than 5 lb (2.3 kg) in one week. This amount may be more or less depending on your condition.  You have trouble staying awake or you feel confused.  You have chest pain.  You do not have an appetite.  You pass out. If you experience any of these symptoms:  Call your local emergency services (911 in the [place] right away or seek help at the emergency department of the nearest hospital. Yellow zone These signs and symptoms mean your condition may be getting worse and you should make some changes:  You have trouble breathing when you are active or you need to sleep with extra pillows.  You have swelling in your legs or abdomen.  You gain 2-3 lb (0.9-1.4 kg) in one day, or 5 lb (2.3 kg) in one week. This amount may be more or less depending on your condition.  You get tired easily.  You have trouble sleeping.  You have a dry cough. If you experience any of these symptoms:  Contact your health care provider within the next day.  Your health care provider may adjust your medicines. Green zone These signs mean you are doing well and can continue what you are doing:  You do not have shortness of breath.  You have very little swelling or no new swelling.  Your weight is stable (no gain or loss).  You have a normal activity level.  You do not have chest pain or any other new symptoms. Follow these instructions at  home:  Take over-the-counter and prescription medicines only as told by your health care provider.  Weigh yourself daily. Your target weight is __________ lb (__________ kg). ? Call your health care provider if you gain more than __________ lb (__________ kg) in a day, or more than __________ lb (__________ kg) in one week.  Eat a heart-healthy diet. Work with a diet and nutrition specialist (dietitian) to create an eating plan that is best for you.  Keep all follow-up visits as told by your health care provider. This is important. Where to find more information  American Heart Association: www.heart.org Summary  Follow the action plan that was created by you and your health care provider.  Get help right away if you have any symptoms in the Red zone. This information is not intended to replace advice given to you by your health care provider. Make sure you discuss any questions you have with your health care provider. Document Revised: 03/01/2017 Document Reviewed: 04/28/2016 Elsevier Patient Education  2020 [employer]  

## 2019-05-01 ENCOUNTER — Telehealth: Payer: Self-pay | Admitting: Family Medicine

## 2019-05-01 DIAGNOSIS — R059 Cough, unspecified: Secondary | ICD-10-CM

## 2019-05-01 DIAGNOSIS — R05 Cough: Secondary | ICD-10-CM

## 2019-05-01 MED ORDER — BENZONATATE 100 MG PO CAPS: 100.0000 mg | ORAL_CAPSULE | Freq: Three times a day (TID) | ORAL | 0 refills | Status: AC | PRN

## 2019-05-01 NOTE — Telephone Encounter (Signed)
Patient is ask Oley Balm if she can call in some cough medication for her to [facility] [address] Not [facility]. Patient had a virtual visit with Adrianna yesterday. Patient coughed a lot last night. Please advise CB- [phone]

## 2019-05-01 NOTE — Telephone Encounter (Signed)
Tessalon perles sent to [facility].

## 2019-05-01 NOTE — Telephone Encounter (Signed)
Patient advised.

## 2019-05-01 NOTE — Telephone Encounter (Signed)
Patient is checking status on medication request.  Call back [phone]

## 2019-05-01 NOTE — Telephone Encounter (Signed)
Please advise 

## 2019-05-04 ENCOUNTER — Ambulatory Visit (INDEPENDENT_AMBULATORY_CARE_PROVIDER_SITE_OTHER): Payer: 59 | Admitting: Psychiatry

## 2019-05-04 ENCOUNTER — Other Ambulatory Visit: Payer: Self-pay

## 2019-05-04 ENCOUNTER — Encounter: Payer: Self-pay | Admitting: Psychiatry

## 2019-05-04 DIAGNOSIS — F4310 Post-traumatic stress disorder, unspecified: Secondary | ICD-10-CM | POA: Diagnosis not present

## 2019-05-04 DIAGNOSIS — F3170 Bipolar disorder, currently in remission, most recent episode unspecified: Secondary | ICD-10-CM | POA: Diagnosis not present

## 2019-05-04 MED ORDER — VENLAFAXINE HCL ER 75 MG PO CP24: 75.0000 mg | ORAL_CAPSULE | Freq: Every day | ORAL | 1 refills | Status: DC

## 2019-05-04 MED ORDER — QUETIAPINE FUMARATE 100 MG PO TABS: 100.0000 mg | ORAL_TABLET | Freq: Every day | ORAL | 1 refills | Status: DC

## 2019-05-04 MED ORDER — ZALEPLON 10 MG PO CAPS: 10.0000 mg | ORAL_CAPSULE | Freq: Every evening | ORAL | 2 refills | Status: DC | PRN

## 2019-05-04 NOTE — Progress Notes (Signed)
[facility] MD OP Progress Note  I connected with  Katelyn Cooper on 05/04/19 by a video enabled telemedicine application and verified that I am speaking with the correct person using two identifiers.   I discussed the limitations of evaluation and management by telemedicine. The patient expressed understanding and agreed to proceed.    05/04/2019 11:35 AM Katelyn Cooper  MRN:  [id number]  Chief Complaint:  " I am doing fine now."  HPI: Pt reported she was quite sick for a few days last week however is feeling much better today.  She informed that she had a brief flareup of her CHF.  She stated that being sick and with all the COVID-19 related restrictions things can get pessimistic at times.  She is hoping that with mom's vaccinations nonbillable things may get better with time.  She is looking forward to getting her vaccinations soon.  She has been able to sleep with help of Sonata.   Visit Diagnosis:    ICD-10-CM   1. Bipolar disorder in remission ([place])  F31.70   2. Post traumatic stress disorder (PTSD)  F43.10     Past Psychiatric History: PTSD, Bipolar d/o  Past Medical History:  Past Medical History:  Diagnosis Date  . (HFpEF) heart failure with preserved ejection fraction ([place])    a. 12/2015 Echo: EF 55-60%, nl RV size/fxn, no significant valvular abnormalities; b. 10/2017 Echo: EF 50-55%, antsept, ant HK. Gr2 DD. Mild MR. Nl RV fxn. PASP 69mmHg.  [name] Anxiety   . CHF (congestive heart failure) ([place])   . Depression   . Diabetes type 2, controlled ([place])    diet-controlled  . Esophageal stricture   . GERD (gastroesophageal reflux disease)   . Hiatal hernia   . History of chicken pox   . Hyperlipidemia   . Hypertension   . Internal hemorrhoids   . Non-obstructive CAD (coronary artery disease)    a. 12/2015 MV: apical ant and apical reversible defect. EF 55-65%; b. 01/2016 Cath: LM nl, LAD nl, SP1 40ost, LCX nl, OM1 30, RCA 30p/m, EDP 15-63mmHg; c. 10/2017 Cath: LM nl, LAD nl, SP1 30ost,  LCX nl, OM1 30, RCA 30p/m. EF 55%.  . Obstructive sleep apnea   . PAD (peripheral artery disease) ([place])    a. 1990's s/p prior LCIA stenting; b. 12/2015 ABI: R 1.05, L 1.03.  . Panic disorder     Past Surgical History:  Procedure Laterality Date  . [place]   stent placement; ? left iliac artery intervention  . APPENDECTOMY  1998  . BASAL CELL CARCINOMA EXCISION    . CARDIAC CATHETERIZATION N/A 01/12/2016   Procedure: Left Heart Cath and Coronary Angiography;  Surgeon: Nelva Bush, MD;  Location: [facility];  Service: Cardiovascular;  Laterality: N/A;  . FOOT SURGERY     Right  . pap smear  03/2010   Done by [name], normal  . REFRACTIVE SURGERY     right  . RIB RESECTION  [id number]  . RIGHT/LEFT HEART CATH AND CORONARY ANGIOGRAPHY N/A 11/08/2017   Procedure: RIGHT/LEFT HEART CATH AND CORONARY ANGIOGRAPHY;  Surgeon: Wellington Hampshire, MD;  Location: [facility];  Service: Cardiovascular;  Laterality: N/A;  . SHOULDER SURGERY  2005   left  . UPPER GI ENDOSCOPY  09/19/2011   Stricture at GE junction, dilated. Mild gastritis and duodenitis. Small hiatal hernia    Family Psychiatric History: sister- bipolar d/o,mom - depression  Family History:  Family  History  Problem Relation Age of Onset  . Lung cancer Mother        was a smoker  . Emphysema Mother   . Cancer Mother        Lung  . Alcohol abuse Mother   . Depression Mother   . Heart disease Mother   . Drug abuse Sister   . Breast cancer Sister   . Emphysema Sister   . Bipolar disorder Sister   . Alcohol abuse Other   . Depression Other   . Bipolar disorder Other   . Heart disease Other   . Vision loss Other   . Alcohol abuse Father   . Mood Disorder Father   . Heart disease Maternal Grandmother   . Stroke Maternal Grandmother   . Esophageal cancer Maternal Aunt   . Colon cancer Neg Hx     Social History:  Social History   Socioeconomic History  . Marital status:  Married    Spouse name: mark  . Number of children: 0  . Years of education: Not on file  . Highest education level: High school graduate  Occupational History  . Occupation: computer-retired    Employer: [employer]  Tobacco Use  . Smoking status: Former Smoker    Packs/day: 1.00    Years: 40.00    Pack years: 40.00    Types: Cigarettes    Start date: 03/01/1975    Quit date: 08/11/2017    Years since quitting: 1.7  . Smokeless tobacco: Never Used  Substance and Sexual Activity  . Alcohol use: No    Alcohol/week: 0.0 standard drinks  . Drug use: No  . Sexual activity: Not Currently  Other Topics Concern  . Not on file  Social History Narrative  . Not on file   Social Determinants of Health   Financial Resource Strain:   . Difficulty of Paying Living Expenses: Not on file  Food Insecurity:   . Worried About Charity fundraiser in the Last Year: Not on file  . Ran Out of Food in the Last Year: Not on file  Transportation Needs:   . Lack of Transportation (Medical): Not on file  . Lack of Transportation (Non-Medical): Not on file  Physical Activity:   . Days of Exercise per Week: Not on file  . Minutes of Exercise per Session: Not on file  Stress:   . Feeling of Stress : Not on file  Social Connections:   . Frequency of Communication with Friends and Family: Not on file  . Frequency of Social Gatherings with Friends and Family: Not on file  . Attends Religious Services: Not on file  . Active Member of Clubs or Organizations: Not on file  . Attends Archivist Meetings: Not on file  . Marital Status: Not on file    Allergies:  Allergies  Allergen Reactions  . Lovastatin     depression  . Paroxetine Hcl     itching  . Lamotrigine Rash    Metabolic Disorder Labs: Lab Results  Component Value Date   HGBA1C 7.2 (A) 04/17/2019   No results found for: PROLACTIN Lab Results  Component Value Date   CHOL 160 04/17/2019   TRIG 169 (H) 04/17/2019   HDL  55 04/17/2019   CHOLHDL 2.9 04/17/2019   LDLCALC 76 04/17/2019   LDLCALC 59 03/21/2018   Lab Results  Component Value Date   TSH 1.550 04/17/2019   TSH 1.26 12/10/2013    Therapeutic Level  Labs: No results found for: LITHIUM No results found for: VALPROATE No components found for:  CBMZ  Current Medications: Current Outpatient Medications  Medication Sig Dispense Refill  . albuterol (PROAIR HFA) 108 (90 Base) MCG/ACT inhaler Inhale 2 puffs into the lungs every 4 (four) hours as needed for wheezing or shortness of breath. (Patient not taking: Reported on 04/27/2019) 1 Inhaler 2  . aspirin EC 81 MG tablet Take 1 tablet (81 mg total) by mouth daily.    [name] atorvastatin (LIPITOR) 40 MG tablet TAKE 1 TABLET BY MOUTH (40MG  TOTAL) DAILY 90 tablet 3  . benzonatate (TESSALON PERLES) 100 MG capsule Take 1 capsule (100 mg total) by mouth 3 (three) times daily as needed for up to 7 days. 21 capsule 0  . Cholecalciferol (VITAMIN D3) 5000 units TABS Take 1 tablet by mouth daily.   0  . cyclobenzaprine (FLEXERIL) 10 MG tablet Take 1 tablet (10 mg total) by mouth 2 (two) times daily as needed for muscle spasms. (Patient not taking: Reported on 04/27/2019) 20 tablet 0  . doxycycline (VIBRA-TABS) 100 MG tablet Take 1 tablet (100 mg total) by mouth 2 (two) times daily for 7 days. 14 tablet 0  . furosemide (LASIX) 20 MG tablet Take 1 tablet (20 mg total) by mouth daily. *NEEDS OFFICE VISIT FOR FURTHER REFILLS* 90 tablet 0  . glucose blood test strip ONETOUCH ULTRA MINI STRIPS AND LANCETS. Use one daily to check blood sugar. 100 each 3  . ibuprofen (ADVIL) 600 MG tablet Take 1 tablet (600 mg total) by mouth every 8 (eight) hours as needed. 30 tablet 0  . KLOR-CON M20 20 MEQ tablet TAKE 2 TABLETS TWICE A DAY 120 tablet 0  . lansoprazole (PREVACID) 15 MG capsule Take 15 mg by mouth daily at 6 (six) AM.    . metFORMIN (GLUCOPHAGE-XR) 500 MG 24 hr tablet TAKE 1 TABLET BY MOUTH EVERY EVENING 90 tablet 3  .  metoprolol tartrate (LOPRESSOR) 25 MG tablet Take 1 tablet (25 mg total) by mouth 2 (two) times daily. 180 tablet 3  . predniSONE (DELTASONE) 20 MG tablet Take 1 tablet (20 mg total) by mouth daily with breakfast for 5 days. 5 tablet 0  . QUEtiapine (SEROQUEL) 100 MG tablet Take 1 tablet (100 mg total) by mouth at bedtime. 90 tablet 1  . venlafaxine XR (EFFEXOR-XR) 75 MG 24 hr capsule Take 1 capsule (75 mg total) by mouth daily with breakfast. 90 capsule 1  . zaleplon (SONATA) 10 MG capsule Take 1 capsule (10 mg total) by mouth at bedtime as needed for sleep. 30 capsule 2   No current facility-administered medications for this visit.     Musculoskeletal: Strength & Muscle Tone: unable to assess due to telemed visit Gait & Station: unable to assess due to telemed visit Patient leans: unable to assess due to telemed visit   Psychiatric Specialty Exam: ROS  There were no vitals taken for this visit.There is no height or weight on file to calculate BMI.  General Appearance: Fairly Groomed  Eye Contact:  Good  Speech:  Clear and Coherent and Normal Rate  Volume:  Normal  Mood:  Euthymic  Affect:  Congruent  Thought Process:  Goal Directed, Linear and Descriptions of Associations: Intact  Orientation:  Full (Time, Place, and Person)  Thought Content: Logical   Suicidal Thoughts:  No  Homicidal Thoughts:  No  Memory:  Recent;   Good Remote;   Good  Judgement:  Good  Insight:  Good  Psychomotor Activity:  Normal  Concentration:  Concentration: Good and Attention Span: Good  Recall:  Good  Fund of Knowledge: Good  Language: Good  Akathisia:  Negative  Handed:  Right  AIMS (if indicated): not tested  Assets:  Communication Skills Desire for Improvement Financial Resources/Insurance Housing Leisure Time Social Support Talents/Skills Transportation  ADL's:  Intact  Cognition: WNL  Sleep:  Good with help of Sonata   Screenings: AIMS     Office Visit from 03/10/2018 in  [facility] Total Score  0    PHQ2-9     Office Visit from 12/31/2017 in [facility] Office Visit from 11/15/2017 in [facility] Office Visit from 09/20/2016 in [facility]  PHQ-2 Total Score  0  0  0  PHQ-9 Total Score  --  --  3       Assessment and Plan: 64 y/o female with hx of CHF, COPD and iron deficiency anemia, Bipolar d/o and PTSD seen for f/up, now stable on her med regimen.  1. Bipolar disorder in remission ([facility])  - venlafaxine XR (EFFEXOR-XR) 75 MG 24 hr capsule; Take 1 capsule (75 mg total) by mouth daily with breakfast.  Dispense: 90 capsule; Refill: 1 - QUEtiapine (SEROQUEL) 100 MG tablet; Take 1 tablet (100 mg total) by mouth at bedtime.  Dispense: 90 tablet; Refill: 1 - Continue Sonata 10 mg HS  2. Post traumatic stress disorder (PTSD)  - venlafaxine XR (EFFEXOR-XR) 75 MG 24 hr capsule; Take 1 capsule (75 mg total) by mouth daily with breakfast.  Dispense: 90 capsule; Refill: 1  F/up in 3 months.  Nevada Crane, MD 05/04/2019, 11:35 AM

## 2019-05-05 ENCOUNTER — Other Ambulatory Visit: Payer: Self-pay

## 2019-05-05 MED ORDER — ATORVASTATIN CALCIUM 40 MG PO TABS: ORAL_TABLET | ORAL | 3 refills | Status: DC

## 2019-05-11 ENCOUNTER — Telehealth: Payer: Self-pay | Admitting: Internal Medicine

## 2019-05-11 NOTE — Telephone Encounter (Signed)
[employer] approved [id number] after reconsideration appeal. [employer] attempted to fax approval to the old [facility] fax number and it did not go through. Since this was a reconsideration, it did not post to the web site, therefore, we had no way of knowing that this had been approved.  Called and spoke with [employer] today and was advised that [id number] had been approved Valid from 03/10/2019 to 09/06/2019.   Called patient and scheduled CT Chest for Monday 05/25/2019 at 8:30 at [facility]. Pt to arrive at 8:15. Pt is aware of appointment date, time and location. Nothing else needed at this time. [name]

## 2019-05-11 NOTE — Telephone Encounter (Signed)
Made patient aware that the [facility] would reach out to her in regards to the CT.  Suanne Marker, please see below message. Thanks

## 2019-05-19 ENCOUNTER — Telehealth (INDEPENDENT_AMBULATORY_CARE_PROVIDER_SITE_OTHER): Payer: Managed Care, Other (non HMO) | Admitting: Family Medicine

## 2019-05-19 DIAGNOSIS — Z1211 Encounter for screening for malignant neoplasm of colon: Secondary | ICD-10-CM | POA: Diagnosis not present

## 2019-05-19 DIAGNOSIS — R195 Other fecal abnormalities: Secondary | ICD-10-CM

## 2019-05-19 DIAGNOSIS — D6489 Other specified anemias: Secondary | ICD-10-CM

## 2019-05-19 NOTE — Telephone Encounter (Signed)
Patient reports she is tolerating the iron supplements well. She denies any intolerances. Patient agrees to pick up hemoccult kit tomorrow. Hemoccult Kit placed up front. She also agrees to have labs rechecked next week.   The OC light test strips are expected to arrive sometime this week. I have Nunzio Cory checking on this.

## 2019-05-19 NOTE — Telephone Encounter (Signed)
Please check with patient to see if she has been tolerating the iron supplements for anemia.  She needs to come in to pick up OC-light card to see if she is losing any blood in stool. Also need to come in sometime within the next week to re-check blood count and iron levels. Future order is in chart.

## 2019-05-19 NOTE — Telephone Encounter (Signed)
Please clarify whether we need to give patient an OC light bottle or the hemoccult cards. We are out of OC light test strips which are used for the bottles. They have been ordered, but haven't come in yet.

## 2019-05-19 NOTE — Telephone Encounter (Signed)
Either one, but if we have OC-light strips when she comes in, that would be preferable. Any idea when they are supposed to get here?

## 2019-05-20 ENCOUNTER — Other Ambulatory Visit: Payer: Self-pay

## 2019-05-20 ENCOUNTER — Other Ambulatory Visit: Payer: Self-pay | Admitting: Physician Assistant

## 2019-05-20 ENCOUNTER — Ambulatory Visit (INDEPENDENT_AMBULATORY_CARE_PROVIDER_SITE_OTHER): Payer: Managed Care, Other (non HMO) | Admitting: Internal Medicine

## 2019-05-20 ENCOUNTER — Telehealth: Payer: Self-pay | Admitting: Physician Assistant

## 2019-05-20 ENCOUNTER — Encounter: Payer: Self-pay | Admitting: Internal Medicine

## 2019-05-20 VITALS — BP 132/62 | HR 94 | Ht 63.0 in | Wt 149.5 lb

## 2019-05-20 DIAGNOSIS — D649 Anemia, unspecified: Secondary | ICD-10-CM

## 2019-05-20 DIAGNOSIS — D6489 Other specified anemias: Secondary | ICD-10-CM

## 2019-05-20 DIAGNOSIS — I739 Peripheral vascular disease, unspecified: Secondary | ICD-10-CM | POA: Diagnosis not present

## 2019-05-20 DIAGNOSIS — E785 Hyperlipidemia, unspecified: Secondary | ICD-10-CM

## 2019-05-20 DIAGNOSIS — I2510 Atherosclerotic heart disease of native coronary artery without angina pectoris: Secondary | ICD-10-CM | POA: Diagnosis not present

## 2019-05-20 DIAGNOSIS — I5032 Chronic diastolic (congestive) heart failure: Secondary | ICD-10-CM

## 2019-05-20 DIAGNOSIS — I5022 Chronic systolic (congestive) heart failure: Secondary | ICD-10-CM

## 2019-05-20 NOTE — Telephone Encounter (Signed)
Placed BMET at request of her cardiologist. Patient is coming in Friday for her previous labwork.

## 2019-05-20 NOTE — Progress Notes (Signed)
Follow-up Outpatient Visit Date: 05/20/2019  Primary Care Provider: [name], MD [address] [place] [address]  Chief Complaint: Cough  HPI:  Katelyn Cooper is a 64 y.o. female with history of nonobstructive CAD, PAD status post left iliac stent,chronic HFpEF,diet-controlled DM2, thoracic outlet syndrome status post bilateral rib resections (0000000), GERD complicated by esophageal stricture, HLD, and anxiety/depression, who presents for follow-up of fatigue and shortness of breath.  I last saw Katelyn Cooper about a month ago, at which time she reported less shortness of breath though she had also been less active.  She still endorsed exertional dyspnea with mild activity such as walking across the room.  She subsequently developed a cough with worsening shortness of breath and was diagnosed with acute heart failure exacerbation.  Symptoms resolved with escalation of diuresis.  Today, Katelyn Cooper reports felling back to baseline without significant shortness of breath.  She is back to taking furosemide 20 mg daily (had been on 40 mg daily during exacerbation).  She denies chest pain, palpitations, lightheadedness, and edema.  Of note, she did not experience significant edema or weight gain with her CHF exacerbation, though she developed 3-pillow orthopnea.  She reports that increased sodium intake may have precipitated the exacerbation.  She continues ongoing evaluation and management of recently discovered iron-deficiency anemia per Dr. Caryn Section.  --------------------------------------------------------------------------------------------------  Cardiovascular History & Procedures: Cardiovascular Problems:  Nonobstructive coronary artery disease  Peripheral vascular status post left iliac stent  HFpEF  Risk Factors:  Diabetes mellitus, hyperlipidemia, peripheral vascular disease  Cath/PCI:  L/RHC (11/08/2017): LMCA with minimal diffuse disease. LAD normal. 30%  stenosis at first septal perforator. LCx with mild luminal irregularities. OM1 with 30% disease. RCA with sequential 30% proximal and mid stenoses.  Upper normal filling pressures and normal pulmonary artery pressure with moderately reduced Fick cardiac output (CO 3.1/CI 1.8).  LHC (01/12/16): LMCA and LAD normal. There is a 40% stenosis in the proximal aspect of the large first septal perforator. There is 30% OM1 stenosis. 30% proximal and mid RCA lesions are also present. LVEDP mildly elevated at 15-20 mmHg.  CV Surgery:  Remote left iliac stent.  EP Procedures and Devices:  None.  Non-Invasive Evaluation(s):  TTE (04/14/2019): Normal LV size and wall thickness with LVEF 60-65%.  There is grade 1 diastolic dysfunction with elevated filling pressures.  Normal RV size and function.  No significant valvular abnormality.  Normal CVP.  Unable to assess PA pressure.  Pharmacologic MPI (03/30/2019): Low risk study withotu ischemia or scar.  Coronary artery calcification noted.  LVEF > 65%.  TTE (11/06/2017): Normal LV size and wall thickness. LVEF 50-55% with hypokinesis of the anteroseptal and anterior myocardium. Grade 2 diastolic dysfunction with elevated filling pressures. Mild MR. Normal RV size and function. Mild pulmonary hypertension (PASP 35 mmHg).  Pharmacologic myocardial perfusion stress test (12/23/15): Small, mild area of reversible defect involving the apical anterior and apical segments that could reflect ischemia or breast attenuation. LVEF 55-65%. Borderline TID was noted.  Transthoracic echocardiogram (12/28/15): Normal LV systolic and diastolic function (EF 0000000). No significant valvular abnormalities. Normal RV size and function.  Aortoiliac and lower extremity Dopplers:Patentstent in the left common iliac artery. No significant abnormalities. ABI right 1.05, left 1.03.  Recent CV Pertinent Labs: Lab Results  Component Value Date   CHOL 160 04/17/2019   HDL 55  04/17/2019   LDLCALC 76 04/17/2019   TRIG 169 (H) 04/17/2019   CHOLHDL 2.9 04/17/2019   INR 1.06  01/06/2016   BNP 731.0 (H) 11/05/2017   K 3.3 (L) 04/17/2019   MG 2.5 (H) 11/08/2017   BUN 7 (L) 04/17/2019   CREATININE 0.95 04/17/2019    Past medical and surgical history were reviewed and updated in EPIC.  Current Meds  Medication Sig  . albuterol (PROAIR HFA) 108 (90 Base) MCG/ACT inhaler Inhale 2 puffs into the lungs every 4 (four) hours as needed for wheezing or shortness of breath.  [name] aspirin EC 81 MG tablet Take 1 tablet (81 mg total) by mouth daily.  [name] atorvastatin (LIPITOR) 40 MG tablet TAKE 1 TABLET BY MOUTH (40MG  TOTAL) DAILY  . Cholecalciferol (VITAMIN D3) 5000 units TABS Take 1 tablet by mouth daily.   . cyclobenzaprine (FLEXERIL) 10 MG tablet Take 1 tablet (10 mg total) by mouth 2 (two) times daily as needed for muscle spasms.  . furosemide (LASIX) 20 MG tablet Take 1 tablet (20 mg total) by mouth daily. *NEEDS OFFICE VISIT FOR FURTHER REFILLS*  . glucose blood test strip ONETOUCH ULTRA MINI STRIPS AND LANCETS. Use one daily to check blood sugar.  . ibuprofen (ADVIL) 600 MG tablet Take 1 tablet (600 mg total) by mouth every 8 (eight) hours as needed.  [name] KLOR-CON M20 20 MEQ tablet TAKE 2 TABLETS TWICE A DAY  . lansoprazole (PREVACID) 15 MG capsule Take 15 mg by mouth daily at 6 (six) AM.  . metFORMIN (GLUCOPHAGE-XR) 500 MG 24 hr tablet TAKE 1 TABLET BY MOUTH EVERY EVENING  . metoprolol tartrate (LOPRESSOR) 25 MG tablet Take 1 tablet (25 mg total) by mouth 2 (two) times daily.  . QUEtiapine (SEROQUEL) 100 MG tablet Take 1 tablet (100 mg total) by mouth at bedtime.  [name] venlafaxine XR (EFFEXOR-XR) 75 MG 24 hr capsule Take 1 capsule (75 mg total) by mouth daily with breakfast.  . zaleplon (SONATA) 10 MG capsule Take 1 capsule (10 mg total) by mouth at bedtime as needed for sleep.    Allergies: Lovastatin, Paroxetine hcl, and Lamotrigine  Social History   Tobacco Use  .  Smoking status: Former Smoker    Packs/day: 1.00    Years: 40.00    Pack years: 40.00    Types: Cigarettes    Start date: 03/01/1975    Quit date: 08/11/2017    Years since quitting: 1.7  . Smokeless tobacco: Never Used  Substance Use Topics  . Alcohol use: No    Alcohol/week: 0.0 standard drinks  . Drug use: No    Family History  Problem Relation Age of Onset  . Lung cancer Mother        was a smoker  . Emphysema Mother   . Cancer Mother        Lung  . Alcohol abuse Mother   . Depression Mother   . Heart disease Mother   . Drug abuse Sister   . Breast cancer Sister   . Emphysema Sister   . Bipolar disorder Sister   . Alcohol abuse Other   . Depression Other   . Bipolar disorder Other   . Heart disease Other   . Vision loss Other   . Alcohol abuse Father   . Mood Disorder Father   . Heart disease Maternal Grandmother   . Stroke Maternal Grandmother   . Esophageal cancer Maternal Aunt   . Colon cancer Neg Hx     Review of Systems: A 12-system review of systems was performed and was negative except as noted in the HPI.  --------------------------------------------------------------------------------------------------  Physical Exam: BP 132/62 (BP Location: Left Arm, Patient Position: Sitting, Cuff Size: Normal)   Pulse 94   Ht 5\' 3"  (1.6 m)   Wt 149 lb 8 oz (67.8 kg)   SpO2 98%   BMI 26.48 kg/m   General:  NAD. Neck: No JVD or HJR. Lungs: CTA bilaterally w/o wheezes or crackles. Heart: RRR w/o murmurs, rubs, or gallops. Abdomen: Soft, NT/ND. Extremities: No LE edema.  EKG:  NSR with nonspecific ST changes.  Previously noted LBBB is no longer present.  Lab Results  Component Value Date   WBC 7.4 04/17/2019   HGB 8.7 (L) 04/17/2019   HCT 28.6 (L) 04/17/2019   MCV 75 (L) 04/17/2019   PLT 210 04/17/2019    Lab Results  Component Value Date   NA 141 04/17/2019   K 3.3 (L) 04/17/2019   CL 101 04/17/2019   CO2 21 04/17/2019   BUN 7 (L) 04/17/2019     CREATININE 0.95 04/17/2019   GLUCOSE 192 (H) 04/17/2019   ALT 19 04/17/2019    Lab Results  Component Value Date   CHOL 160 04/17/2019   HDL 55 04/17/2019   LDLCALC 76 04/17/2019   TRIG 169 (H) 04/17/2019   CHOLHDL 2.9 04/17/2019    --------------------------------------------------------------------------------------------------  ASSESSMENT AND PLAN: Chronic HFpEF: Katelyn Cooper appears euvolemic and reports feeling almost back to baseline with NYHA class II symptoms.  I suspect that increased sodium intake may have precipitated recent exacerbation.  I have encouraged her to minimize her sodium intake and to take an additional dose of furosemide at the first sign of fluid retention (weight gain, edema, shortness of breath).  We will have her take furosemide 40 mg daily for the next 1-2 days and then return to 20 mg daily thereafter if she has returned to baseline.  We will check a BMP when she has her next blood draw later this month through Dr. Maralyn Sago office.  Nonobstructive coronary artery disease: Continue medical therapy to prevent progression of disease, including aspirin and atorvastatin.  Peripheral vascular disease: No claudication s/p remote iliac stenting.  Continue aspirin and statin therapy.  Hyperlipidemia: LDL slightly above goal on last check a month ago (LDL 76).  Continue atorvastatin 40 mg daily and work on lifestyle modifications.  Anemia: This may have contributed to recent dyspnea.  Continue workup and management per Dr. Caryn Section.  Follow-up: Return to clinic in 1 month.  Nelva Bush, MD 05/22/2019 8:09 AM

## 2019-05-20 NOTE — Telephone Encounter (Signed)
-----   Message from Vanessa Ralphs, RN sent at 05/20/2019  4:17 PM EST ----- Regarding: BMET Hi Ms. Terrilee Croak,  Dr End saw this patient today. She is coming to your office for lab work next week. He was wondering if you could draw a BMET on patient at that time. I can go ahead and put in the order if necessary.  Thank you for your consideration and let me know if this is ok. Thanks, Lars Pinks, RN

## 2019-05-20 NOTE — Patient Instructions (Signed)
Medication Instructions:  Your physician has recommended you make the following change in your medication:  1- INCREASE Furosemide to 40 mg by mouth daily for 1-2 days, then go back to 20 mg daily.  *If you need a refill on your cardiac medications before your next appointment, please call your pharmacy*  Lab Work: Your physician recommends that you have a BMET when you PCP draws lab work. We will send a message to them to make sure that is ok.  If you have labs (blood work) drawn today and your tests are completely normal, you will receive your results only by: [name] MyChart Message (if you have MyChart) OR . A paper copy in the mail If you have any lab test that is abnormal or we need to change your treatment, we will call you to review the results.  Testing/Procedures: none  Follow-Up: At [facility], you and your health needs are our priority.  As part of our continuing mission to provide you with exceptional heart care, we have created designated Provider Care Teams.  These Care Teams include your primary Cardiologist (physician) and Advanced Practice Providers (APPs -  Physician Assistants and Nurse Practitioners) who all work together to provide you with the care you need, when you need it.  Your next appointment:   1 month(s)  The format for your next appointment:   In Person  Provider:    You may see Nelva Bush, MD or one of the following Advanced Practice Providers on your designated Care Team:    Murray Hodgkins, NP  Christell Faith, PA-C  Marrianne Mood, PA-C

## 2019-05-22 ENCOUNTER — Encounter: Payer: Self-pay | Admitting: Internal Medicine

## 2019-05-25 ENCOUNTER — Other Ambulatory Visit: Payer: Self-pay | Admitting: Physician Assistant

## 2019-05-25 ENCOUNTER — Ambulatory Visit
Admission: RE | Admit: 2019-05-25 | Discharge: 2019-05-25 | Disposition: A | Discharge status: Home / Self Care | Payer: Managed Care, Other (non HMO) | Source: Ambulatory Visit | Attending: Internal Medicine | Admitting: Internal Medicine

## 2019-05-25 ENCOUNTER — Other Ambulatory Visit: Payer: Self-pay

## 2019-05-25 DIAGNOSIS — R918 Other nonspecific abnormal finding of lung field: Secondary | ICD-10-CM | POA: Diagnosis not present

## 2019-05-27 ENCOUNTER — Other Ambulatory Visit: Payer: Self-pay | Admitting: Family Medicine

## 2019-05-27 LAB — POC HEMOCCULT BLD/STL (HOME/3-CARD/SCREEN)
Card #2 Fecal Occult Blod, POC: POSITIVE
Card #3 Fecal Occult Blood, POC: NEGATIVE
Fecal Occult Blood, POC: POSITIVE — AB

## 2019-05-27 LAB — HM PAP SMEAR: HM Pap smear: NORMAL

## 2019-05-27 NOTE — Telephone Encounter (Signed)
Hemoccult card returned today after patient collection. Result: Positive.

## 2019-05-27 NOTE — Addendum Note (Signed)
Addended by: Randal Buba on: 05/27/2019 05:40 PM   Modules accepted: Orders

## 2019-05-28 LAB — BASIC METABOLIC PANEL
BUN/Creatinine Ratio: 9 — ABNORMAL LOW (ref 12–28)
BUN: 8 mg/dL (ref 8–27)
CO2: 22 mmol/L (ref 20–29)
Calcium: 9.7 mg/dL (ref 8.7–10.3)
Chloride: 99 mmol/L (ref 96–106)
Creatinine, Ser: 0.87 mg/dL (ref 0.57–1.00)
GFR calc Af Amer: 81 mL/min/{1.73_m2} (ref >59)
GFR calc non Af Amer: 70 mL/min/{1.73_m2} (ref >59)
Glucose: 148 mg/dL — ABNORMAL HIGH (ref 65–99)
Potassium: 3.2 mmol/L — ABNORMAL LOW (ref 3.5–5.2)
Sodium: 142 mmol/L (ref 134–144)

## 2019-05-28 LAB — IRON,TIBC AND FERRITIN PANEL
Ferritin: 38 ng/mL (ref 15–150)
Iron Saturation: 56 % — ABNORMAL HIGH (ref 15–55)
Iron: 202 ug/dL — ABNORMAL HIGH (ref 27–139)
Total Iron Binding Capacity: 358 ug/dL (ref 250–450)
UIBC: 156 ug/dL (ref 118–369)

## 2019-05-28 LAB — HEMOGLOBIN: Hemoglobin: 11.1 g/dL (ref 11.1–15.9)

## 2019-05-28 NOTE — Telephone Encounter (Addendum)
Please advise patient that occult blood test is positive. Considering her history of colon polyps and iron deficient anemia, she needs referral back to her GI Dr. Fuller Plan to see where blood is coming from.  Also, Hemoglobin and iron levels are better, continue current iron supplement for now. Potassium is a little low, be sure to take potassium supplements every day

## 2019-05-28 NOTE — Addendum Note (Signed)
Addended by: Jules Schick on: 05/28/2019 09:20 AM   Modules accepted: Orders

## 2019-05-28 NOTE — Telephone Encounter (Signed)
Patient advised. Referral placed  

## 2019-05-31 ENCOUNTER — Other Ambulatory Visit: Payer: Self-pay | Admitting: Internal Medicine

## 2019-06-02 NOTE — Patient Instructions (Signed)
.   Please review the attached list of medications and notify my office if there are any errors.   . Please bring all of your medications to every appointment so we can make sure that our medication list is the same as yours.   

## 2019-06-03 ENCOUNTER — Other Ambulatory Visit: Payer: Self-pay | Admitting: Obstetrics and Gynecology

## 2019-06-03 DIAGNOSIS — N6489 Other specified disorders of breast: Secondary | ICD-10-CM

## 2019-06-10 ENCOUNTER — Other Ambulatory Visit: Payer: Self-pay

## 2019-06-10 ENCOUNTER — Ambulatory Visit: Payer: Managed Care, Other (non HMO)

## 2019-06-10 ENCOUNTER — Ambulatory Visit
Admission: RE | Admit: 2019-06-10 | Discharge: 2019-06-10 | Disposition: A | Discharge status: Home / Self Care | Payer: BC Managed Care – PPO | Source: Ambulatory Visit | Attending: Obstetrics and Gynecology | Admitting: Obstetrics and Gynecology

## 2019-06-10 DIAGNOSIS — N6489 Other specified disorders of breast: Secondary | ICD-10-CM

## 2019-06-10 DIAGNOSIS — R928 Other abnormal and inconclusive findings on diagnostic imaging of breast: Secondary | ICD-10-CM | POA: Diagnosis not present

## 2019-06-12 ENCOUNTER — Other Ambulatory Visit: Payer: Self-pay | Admitting: Family Medicine

## 2019-06-12 ENCOUNTER — Other Ambulatory Visit: Payer: Self-pay

## 2019-06-12 DIAGNOSIS — E119 Type 2 diabetes mellitus without complications: Secondary | ICD-10-CM

## 2019-06-12 MED ORDER — FUROSEMIDE 20 MG PO TABS: 20.0000 mg | ORAL_TABLET | Freq: Every day | ORAL | 0 refills | Status: DC

## 2019-06-12 MED ORDER — POTASSIUM CHLORIDE CRYS ER 20 MEQ PO TBCR: 40.0000 meq | EXTENDED_RELEASE_TABLET | Freq: Two times a day (BID) | ORAL | 0 refills | Status: DC

## 2019-06-12 MED ORDER — METFORMIN HCL ER 500 MG PO TB24: 500.0000 mg | ORAL_TABLET | Freq: Every evening | ORAL | 1 refills | Status: DC

## 2019-06-12 NOTE — Telephone Encounter (Signed)
*  STAT* If patient is at the pharmacy, call can be transferred to refill team.   1. Which medications need to be refilled? (please list name of each medication and dose if known) lasix and Klor-con 2. Which pharmacy/location (including street and city if local pharmacy) is medication to be sent to? [facility]  3. Do they need a 30 day or 90 day supply? Katelyn Cooper

## 2019-06-12 NOTE — Telephone Encounter (Signed)
Pt is requesting a refill for metFORMIN (GLUCOPHAGE-XR) 500 MG 24 hr tablet    Pharmacy:  [facility] [place], [place] Phone:  [phone]  Fax:  [phone]

## 2019-06-23 ENCOUNTER — Other Ambulatory Visit: Payer: Self-pay

## 2019-06-23 ENCOUNTER — Ambulatory Visit (INDEPENDENT_AMBULATORY_CARE_PROVIDER_SITE_OTHER): Payer: BC Managed Care – PPO | Admitting: Gastroenterology

## 2019-06-23 ENCOUNTER — Encounter: Payer: Self-pay | Admitting: Gastroenterology

## 2019-06-23 VITALS — BP 124/66 | HR 84 | Temp 98.0°F | Ht 63.0 in | Wt 150.6 lb

## 2019-06-23 DIAGNOSIS — D509 Iron deficiency anemia, unspecified: Secondary | ICD-10-CM | POA: Diagnosis not present

## 2019-06-23 DIAGNOSIS — Z86010 Personal history of colonic polyps: Secondary | ICD-10-CM

## 2019-06-23 DIAGNOSIS — K219 Gastro-esophageal reflux disease without esophagitis: Secondary | ICD-10-CM

## 2019-06-23 DIAGNOSIS — R195 Other fecal abnormalities: Secondary | ICD-10-CM

## 2019-06-23 MED ORDER — NA SULFATE-K SULFATE-MG SULF 17.5-3.13-1.6 GM/177ML PO SOLN: 1.0000 | Freq: Once | ORAL | 0 refills | Status: AC

## 2019-06-23 NOTE — Progress Notes (Signed)
    History of Present Illness: This is a 64 year old female with anemia, ferritin=38, hemoccult positive stool, personal history of adenomatous colon polyps, history of erosive gastritis and GERD. Started on iron and Hgb has improved from 8.7 to 11.1.  She relates occasional breakthrough reflux symptoms with certain foods and when eating after 7 pm.  Otherwise her reflux symptoms are controlled on Prevacid OTC.  No other gastrointestinal complaints.  Colonoscopy in October 2017 showed one 6 mm tubular adenoma with a good prep and complete to the cecum..  EGD in December 2018 showed erosive gastritis. Denies weight loss, abdominal pain, constipation, diarrhea, change in stool caliber, melena, hematochezia, nausea, vomiting, dysphagia, chest pain.   Current Medications, Allergies, Past Medical History, Past Surgical History, Family History and Social History were reviewed in [employer] record.   Physical Exam: General: Well developed, well nourished, no acute distress Head: Normocephalic and atraumatic Eyes:  sclerae anicteric, EOMI Ears: Normal auditory acuity Mouth: Not examined, mask on during Covid-19 pandemic Lungs: Clear throughout to auscultation Heart: Regular rate and rhythm; no murmurs, rubs or bruits Abdomen: Soft, non tender and non distended. No masses, hepatosplenomegaly or hernias noted. Normal Bowel sounds Rectal: deferred to colonoscopy  Musculoskeletal: Symmetrical with no gross deformities  Pulses:  Normal pulses noted Extremities: No clubbing, cyanosis, edema or deformities noted Neurological: Alert oriented x 4, grossly nonfocal Psychological:  Alert and cooperative. Normal mood and affect   Assessment and Recommendations:  1. Iron deficiency anemia, ferritin=38, occult blood in stool, personal history of adenomatous colon polyps, history of erosive gastritis, GERD.  Rule out colorectal neoplasms, erosive gastritis, ulcer, etc.  Continue iron  twice daily however hold 5 days prior to colonoscopy.  Follow antireflux measures.  Continue Prevacid OTC 15 mg p.o. daily.  Schedule colonoscopy and EGD. The risks (including bleeding, perforation, infection, missed lesions, medication reactions and possible hospitalization or surgery if complications occur), benefits, and alternatives to colonoscopy with possible biopsy and possible polypectomy were discussed with the patient and they consent to proceed. The risks (including bleeding, perforation, infection, missed lesions, medication reactions and possible hospitalization or surgery if complications occur), benefits, and alternatives to endoscopy with possible biopsy and possible dilation were discussed with the patient and they consent to proceed.

## 2019-06-23 NOTE — Patient Instructions (Addendum)
You have been scheduled for an endoscopy and colonoscopy. Please follow the written instructions given to you at your visit today. Please pick up your prep supplies at the pharmacy within the next 1-3 days. If you use inhalers (even only as needed), please bring them with you on the day of your procedure.  Normal BMI (Body Mass Index- based on height and weight) is between 19 and 25. Your BMI today is Body mass index is 26.68 kg/m. [name] Please consider follow up  regarding your BMI with your Primary Care Provider.   Thank you for choosing me and [facility].  Pricilla Riffle. Dagoberto Ligas., MD., Marval Regal

## 2019-06-26 ENCOUNTER — Other Ambulatory Visit: Payer: Self-pay

## 2019-06-26 ENCOUNTER — Encounter: Payer: Self-pay | Admitting: Cardiovascular Disease

## 2019-06-26 ENCOUNTER — Ambulatory Visit (INDEPENDENT_AMBULATORY_CARE_PROVIDER_SITE_OTHER): Payer: BC Managed Care – PPO | Admitting: Physician Assistant

## 2019-06-26 VITALS — BP 120/60 | HR 80 | Ht 63.0 in | Wt 150.1 lb

## 2019-06-26 DIAGNOSIS — I2510 Atherosclerotic heart disease of native coronary artery without angina pectoris: Secondary | ICD-10-CM | POA: Diagnosis not present

## 2019-06-26 DIAGNOSIS — I739 Peripheral vascular disease, unspecified: Secondary | ICD-10-CM | POA: Diagnosis not present

## 2019-06-26 DIAGNOSIS — D649 Anemia, unspecified: Secondary | ICD-10-CM

## 2019-06-26 DIAGNOSIS — E785 Hyperlipidemia, unspecified: Secondary | ICD-10-CM

## 2019-06-26 DIAGNOSIS — I5032 Chronic diastolic (congestive) heart failure: Secondary | ICD-10-CM

## 2019-06-26 DIAGNOSIS — E876 Hypokalemia: Secondary | ICD-10-CM | POA: Diagnosis not present

## 2019-06-26 DIAGNOSIS — I10 Essential (primary) hypertension: Secondary | ICD-10-CM

## 2019-06-26 NOTE — Progress Notes (Signed)
Cardiology Office Note    Date:  06/26/2019   ID:  Katelyn Cooper, DOB 1955-05-29, MRN [id number]  PCP:  [name], MD  Cardiologist:  Nelva Bush, MD  Electrophysiologist:  None   Chief Complaint: Follow up  History of Present Illness:   Katelyn Cooper is a 64 y.o. female with history of nonobstructive CAD by LHC as outlined below, HFpEF, PAD status post left iliac stenting, DM2, thoracic outlet syndrome status post bilateral rib resections in 0000000, HLD, GERD complicated by esophageal stricture, iron deficiency anemia, and anxiety/depression who presents for follow-up of her diastolic CHF.  Prior LHC from 01/2016 demonstrated 40% stenosis in the proximal aspect of a large first septal perforator along with 30% OM1 stenosis and 30% proximal and mid RCA stenoses with an LVEDP of 15 to 20 mmHg.  R/LHC in 10/2017 showed 30% stenosis at the first septal perforator, OM1 30% stenosis, sequential 30% proximal and mid RCA stenoses, upper normal filling pressures and normal pulmonary artery pressure with a moderately reduced cardiac output as outlined below.  Lexiscan MPI from 03/2019 was low risk without evidence of ischemia or scar coronary artery calcification noted.  LVEF greater than 65%.  Most recent surface echo from 04/2019 showed an EF of 60 to 123456, grade 1 diastolic dysfunction with elevated filling pressures, normal RV size and systolic function, no significant valvular abnormality, normal CVP, unable to assess PA pressure.  In 03/2019 she was seen by Dr. Garen Lah as a working for dyspnea and weight gain up 4 pounds with recommendation to proceed with the aforementioned [place] and echo.  She followed up with her primary cardiologist in 04/2019 with continued dyspnea.  It was noted she was anemic with a hemoglobin of 8.7.  Her metoprolol was titrated to 25 mg twice daily.  She was noted to be Hemoccult positive.  She was subsequently started on iron and has been evaluated by GI  with planned EGD and colonoscopy next month.  Lasix was escalated to 40 mg daily.  She was last seen by Dr. Saunders Revel in 05/2019 at which time she was feeling better following outpatient diuresis and she was back to taking Lasix 20 mg daily.  Her weight was down 4 pounds when compared to her visit from 04/2019.  She was advised to take Lasix 40 mg daily for another 1 to 2 days followed by resumption of 20 mg daily thereafter if her symptoms were back to baseline.  She comes in today doing very well.  She notes her breathing is back to baseline.  Her weight has been stable around 147 to 148 pounds.  She does feel like she has a narrow therapeutic window.  She reports if she were to wait for the typical 2 to 3 pound weight gain overnight or 5 pound weight gain in a 1 week time span she would already be quite symptomatic.  She is following her weights very closely and is limiting her sodium intake.  She is drinking less than 2 L of fluid per day.  BP and heart rate have been well controlled on higher dose metoprolol.  No lifestyle limiting claudication symptoms.  She is pleased with her improvement.   Labs independently reviewed: 05/2019 - HGB 11.1, potassium 3.2, BUN 8, serum creatinine 0.87 04/2019 -TSH normal, Hgb 8.7, PLT 210, TC 160, TG 169, HDL 55, LDL 76, albumin 4.6, AST/ALT normal, A1c 7.2  Past Medical History:  Diagnosis Date  . (HFpEF) heart failure with preserved  ejection fraction ([place])    a. 12/2015 Echo: EF 55-60%, nl RV size/fxn, no significant valvular abnormalities; b. 10/2017 Echo: EF 50-55%, antsept, ant HK. Gr2 DD. Mild MR. Nl RV fxn. PASP 16mmHg.  [name] Anxiety   . CHF (congestive heart failure) ([place])   . Depression   . Diabetes type 2, controlled ([place])    diet-controlled  . Esophageal stricture   . GERD (gastroesophageal reflux disease)   . Hiatal hernia   . History of chicken pox   . Hyperlipidemia   . Hypertension   . Internal hemorrhoids   . Non-obstructive CAD (coronary artery  disease)    a. 12/2015 MV: apical ant and apical reversible defect. EF 55-65%; b. 01/2016 Cath: LM nl, LAD nl, SP1 40ost, LCX nl, OM1 30, RCA 30p/m, EDP 15-69mmHg; c. 10/2017 Cath: LM nl, LAD nl, SP1 30ost, LCX nl, OM1 30, RCA 30p/m. EF 55%.  . Obstructive sleep apnea   . PAD (peripheral artery disease) ([place])    a. 1990's s/p prior LCIA stenting; b. 12/2015 ABI: R 1.05, L 1.03.  . Panic disorder     Past Surgical History:  Procedure Laterality Date  . [place]   stent placement; ? left iliac artery intervention  . APPENDECTOMY  1998  . BASAL CELL CARCINOMA EXCISION    . CARDIAC CATHETERIZATION N/A 01/12/2016   Procedure: Left Heart Cath and Coronary Angiography;  Surgeon: Nelva Bush, MD;  Location: [facility];  Service: Cardiovascular;  Laterality: N/A;  . FOOT SURGERY     Right  . pap smear  03/2010   Done by [name], normal  . REFRACTIVE SURGERY     right  . RIB RESECTION  [id number]  . RIGHT/LEFT HEART CATH AND CORONARY ANGIOGRAPHY N/A 11/08/2017   Procedure: RIGHT/LEFT HEART CATH AND CORONARY ANGIOGRAPHY;  Surgeon: Wellington Hampshire, MD;  Location: [facility];  Service: Cardiovascular;  Laterality: N/A;  . SHOULDER SURGERY  2005   left  . UPPER GI ENDOSCOPY  09/19/2011   Stricture at GE junction, dilated. Mild gastritis and duodenitis. Small hiatal hernia    Current Medications: Current Meds  Medication Sig  . albuterol (PROAIR HFA) 108 (90 Base) MCG/ACT inhaler Inhale 2 puffs into the lungs every 4 (four) hours as needed for wheezing or shortness of breath.  [name] aspirin EC 81 MG tablet Take 1 tablet (81 mg total) by mouth daily.  [name] atorvastatin (LIPITOR) 40 MG tablet TAKE 1 TABLET BY MOUTH (40MG  TOTAL) DAILY  . Cholecalciferol (VITAMIN D3) 5000 units TABS Take 1 tablet by mouth daily.   . Ferrous Sulfate (IRON PO) Take by mouth. Taking 1 capsule daily  . furosemide (LASIX) 20 MG tablet Take 1 tablet (20 mg total) by mouth daily.  [name]  glucose blood test strip ONETOUCH ULTRA MINI STRIPS AND LANCETS. Use one daily to check blood sugar.  . ibuprofen (ADVIL) 600 MG tablet Take 1 tablet (600 mg total) by mouth every 8 (eight) hours as needed.  . IRON, FERROUS SULFATE, PO Take by mouth.  . lansoprazole (PREVACID) 15 MG capsule Take 15 mg by mouth daily at 6 (six) AM.  . metFORMIN (GLUCOPHAGE-XR) 500 MG 24 hr tablet Take 1 tablet (500 mg total) by mouth every evening.  . metoprolol tartrate (LOPRESSOR) 25 MG tablet Take 1 tablet (25 mg total) by mouth 2 (two) times daily.  . Multiple Vitamins-Minerals (MULTI-VITAMIN GUMMIES PO) Take by mouth. Taking 1 daily  . potassium chloride  SA (KLOR-CON M20) 20 MEQ tablet Take 2 tablets (40 mEq total) by mouth 2 (two) times daily.  . QUEtiapine (SEROQUEL) 100 MG tablet Take 1 tablet (100 mg total) by mouth at bedtime.  [name] venlafaxine XR (EFFEXOR-XR) 75 MG 24 hr capsule Take 1 capsule (75 mg total) by mouth daily with breakfast.  . zaleplon (SONATA) 10 MG capsule Take 1 capsule (10 mg total) by mouth at bedtime as needed for sleep.    Allergies:   Lovastatin, Paroxetine hcl, and Lamotrigine   Social History   Socioeconomic History  . Marital status: Married    Spouse name: mark  . Number of children: 0  . Years of education: Not on file  . Highest education level: High school graduate  Occupational History  . Occupation: computer-retired    Employer: [employer]  Tobacco Use  . Smoking status: Former Smoker    Packs/day: 1.00    Years: 40.00    Pack years: 40.00    Types: Cigarettes    Start date: 03/01/1975    Quit date: 08/11/2017    Years since quitting: 1.8  . Smokeless tobacco: Never Used  Substance and Sexual Activity  . Alcohol use: No    Alcohol/week: 0.0 standard drinks  . Drug use: No  . Sexual activity: Not Currently  Other Topics Concern  . Not on file  Social History Narrative  . Not on file   Social Determinants of Health   Financial Resource Strain:   .  Difficulty of Paying Living Expenses:   Food Insecurity:   . Worried About Charity fundraiser in the Last Year:   . Arboriculturist in the Last Year:   Transportation Needs:   . Film/video editor (Medical):   [name] Lack of Transportation (Non-Medical):   Physical Activity:   . Days of Exercise per Week:   . Minutes of Exercise per Session:   Stress:   . Feeling of Stress :   Social Connections:   . Frequency of Communication with Friends and Family:   . Frequency of Social Gatherings with Friends and Family:   . Attends Religious Services:   . Active Member of Clubs or Organizations:   . Attends Archivist Meetings:   [name] Marital Status:      Family History:  The patient's family history includes Alcohol abuse in her father, mother, and another family member; Bipolar disorder in her sister and another family member; Breast cancer in her sister; Cancer in her mother; Depression in her mother and another family member; Drug abuse in her sister; Emphysema in her mother and sister; Esophageal cancer in her maternal aunt; Heart disease in her maternal grandmother, mother, and another family member; Lung cancer in her mother; Mood Disorder in her father; Stroke in her maternal grandmother; Vision loss in an other family member. There is no history of Colon cancer.  ROS:   Review of Systems  Constitutional: Negative for chills, diaphoresis, fever, malaise/fatigue and weight loss.  HENT: Negative for congestion.   Eyes: Negative for discharge and redness.  Respiratory: Negative for cough, sputum production, shortness of breath and wheezing.   Cardiovascular: Negative for chest pain, palpitations, orthopnea, claudication, leg swelling and PND.  Gastrointestinal: Negative for abdominal pain, heartburn, nausea and vomiting.  Musculoskeletal: Negative for falls and myalgias.  Skin: Negative for rash.  Neurological: Negative for dizziness, tingling, tremors, sensory change, speech  change, focal weakness, loss of consciousness and weakness.  Endo/Heme/Allergies: Does not  bruise/bleed easily.  Psychiatric/Behavioral: Negative for substance abuse. The patient is not nervous/anxious.   All other systems reviewed and are negative.    EKGs/Labs/Other Studies Reviewed:    Studies reviewed were summarized above. The additional studies were reviewed today:  Cath/PCI:  L/RHC (11/08/2017): LMCA with minimal diffuse disease. LAD normal. 30% stenosis at first septal perforator. LCx with mild luminal irregularities. OM1 with 30% disease. RCA with sequential 30% proximal and mid stenoses.  Upper normal filling pressures and normal pulmonary artery pressure with moderately reduced Fick cardiac output (CO 3.1/CI 1.8).  LHC (01/12/16): LMCA and LAD normal. There is a 40% stenosis in the proximal aspect of the large first septal perforator. There is 30% OM1 stenosis. 30% proximal and mid RCA lesions are also present. LVEDP mildly elevated at 15-20 mmHg.   Non-Invasive Evaluation(s):  TTE (04/14/2019): Normal LV size and wall thickness with LVEF 60-65%. There is grade 1 diastolic dysfunction with elevated filling pressures. Normal RV size and function. No significant valvular abnormality. Normal CVP. Unable to assess PA pressure.  Pharmacologic MPI (03/30/2019): Low risk study withotu ischemia or scar. Coronary artery calcification noted. LVEF >65%.  TTE (11/06/2017): Normal LV size and wall thickness. LVEF 50-55% with hypokinesis of the anteroseptal and anterior myocardium. Grade 2 diastolic dysfunction with elevated filling pressures. Mild MR. Normal RV size and function. Mild pulmonary hypertension (PASP 35 mmHg).  Pharmacologic myocardial perfusion stress test (12/23/15): Small, mild area of reversible defect involving the apical anterior and apical segments that could reflect ischemia or breast attenuation. LVEF 55-65%. Borderline TID was noted.  Transthoracic  echocardiogram (12/28/15): Normal LV systolic and diastolic function (EF 0000000). No significant valvular abnormalities. Normal RV size and function.  Aortoiliac and lower extremity Dopplers:Patentstent in the left common iliac artery. No significant abnormalities. ABI right 1.05, left 1.03.   EKG:  EKG is ordered today.  The EKG ordered today demonstrates NSR, 80 bpm, nonspecific ST-T changes  Recent Labs: 04/17/2019: ALT 19; Platelets 210; TSH 1.550 05/27/2019: BUN 8; Creatinine, Ser 0.87; Hemoglobin 11.1; Potassium 3.2; Sodium 142  Recent Lipid Panel    Component Value Date/Time   CHOL 160 04/17/2019 1029   TRIG 169 (H) 04/17/2019 1029   HDL 55 04/17/2019 1029   CHOLHDL 2.9 04/17/2019 1029   LDLCALC 76 04/17/2019 1029    PHYSICAL EXAM:    VS:  BP 120/60 (BP Location: Left Arm, Patient Position: Sitting, Cuff Size: Normal)   Pulse 80   Ht 5\' 3"  (1.6 m)   Wt 150 lb 2 oz (68.1 kg)   SpO2 96%   BMI 26.59 kg/m   BMI: Body mass index is 26.59 kg/m.  Physical Exam  Constitutional: She is oriented to person, place, and time. She appears well-developed and well-nourished.  HENT:  Head: Normocephalic and atraumatic.  Eyes: Right eye exhibits no discharge. Left eye exhibits no discharge.  Neck: No JVD present.  Cardiovascular: Normal rate, regular rhythm, S1 normal, S2 normal and normal heart sounds. Exam reveals no distant heart sounds, no friction rub, no midsystolic click and no opening snap.  No murmur heard. Pulses:      Posterior tibial pulses are 2+ on the right side and 2+ on the left side.  Pulmonary/Chest: Effort normal and breath sounds normal. No respiratory distress. She has no decreased breath sounds. She has no wheezes. She has no rales. She exhibits no tenderness.  Abdominal: Soft. She exhibits no distension. There is no abdominal tenderness.  Musculoskeletal:  General: No edema.     Cervical back: Normal range of motion.  Neurological: She is alert and  oriented to person, place, and time.  Skin: Skin is warm and dry. No cyanosis. Nails show no clubbing.  Psychiatric: She has a normal mood and affect. Her speech is normal and behavior is normal. Judgment and thought content normal.    Wt Readings from Last 3 Encounters:  06/26/19 150 lb 2 oz (68.1 kg)  06/23/19 150 lb 9.6 oz (68.3 kg)  05/20/19 149 lb 8 oz (67.8 kg)     ASSESSMENT & PLAN:   1. HFpEF: Volume status is much improved.  She is euvolemic and well compensated.  NYHA class II symptoms.  Continue current dose Lasix 20 mg daily with KCl repletion.  Low-sodium diet.  Check BMP as below.  2. Nonobstructive CAD: No symptoms concerning for angina.  Continue current medical therapy including aspirin and atorvastatin.  Recent nonischemic Myoview as outlined above.  3. PAD: Status post left iliac stent.  Most recent ABIs bilaterally with patent iliac stent noted.  No lifestyle limiting claudication symptoms.  Remains on aspirin and statin.  4. HLD: LDL nearly at goal with a value of 76 from 04/2019 with normal AST/ALT at that time.  Remains on atorvastatin 40 mg daily.  5. Anemia: Improved by most recent hemoglobin from 05/2019.  Scheduled for EGD and colonoscopy.  Suspect that this exacerbated her underlying presentation.  6. Hypokalemia: Check BMP with recommendation to replete potassium to goal 4.0.   Disposition: F/u with Dr. Saunders Revel in 3 months.   Medication Adjustments/Labs and Tests Ordered: Current medicines are reviewed at length with the patient today.  Concerns regarding medicines are outlined above. Medication changes, Labs and Tests ordered today are summarized above and listed in the Patient Instructions accessible in Encounters.   Signed, Christell Faith, PA-C 06/26/2019 10:23 AM     [facility] [address] [place], [place] [address] [phone]

## 2019-06-26 NOTE — Patient Instructions (Addendum)
Medication Instructions:  Your physician recommends that you continue on your current medications as directed. Please refer to the Current Medication list given to you today.  *If you need a refill on your cardiac medications before your next appointment, please call your pharmacy*  Lab Work: Your physician recommends that you return for lab work in: TODAY - BMET.   If you have labs (blood work) drawn today and your tests are completely normal, you will receive your results only by: . MyChart Message (if you have MyChart) OR . A paper copy in the mail If you have any lab test that is abnormal or we need to change your treatment, we will call you to review the results.  Testing/Procedures: none  Follow-Up: At [facility], you and your health needs are our priority.  As part of our continuing mission to provide you with exceptional heart care, we have created designated Provider Care Teams.  These Care Teams include your primary Cardiologist (physician) and Advanced Practice Providers (APPs -  Physician Assistants and Nurse Practitioners) who all work together to provide you with the care you need, when you need it.  We recommend signing up for the patient portal called "MyChart".  Sign up information is provided on this After Visit Summary.  MyChart is used to connect with patients for Virtual Visits (Telemedicine).  Patients are able to view lab/test results, encounter notes, upcoming appointments, etc.  Non-urgent messages can be sent to your provider as well.   To learn more about what you can do with MyChart, go to [email or web address].com.    Your next appointment:   3 month(s)  The format for your next appointment:   In Person  Provider:    You may see Christopher End, MD or one of the following Advanced Practice Providers on your designated Care Team:    Christopher Berge, NP  Ryan Dunn, PA-C  Jacquelyn Visser, PA-C   

## 2019-06-26 NOTE — Progress Notes (Signed)
Patient changed to Christell Faith, PA-C schedule secondary to physician needed procedure in the hospital.

## 2019-06-27 LAB — BASIC METABOLIC PANEL
BUN/Creatinine Ratio: 10 — ABNORMAL LOW (ref 12–28)
BUN: 8 mg/dL (ref 8–27)
CO2: 20 mmol/L (ref 20–29)
Calcium: 9.9 mg/dL (ref 8.7–10.3)
Chloride: 101 mmol/L (ref 96–106)
Creatinine, Ser: 0.82 mg/dL (ref 0.57–1.00)
GFR calc Af Amer: 87 mL/min/{1.73_m2} (ref >59)
GFR calc non Af Amer: 75 mL/min/{1.73_m2} (ref >59)
Glucose: 170 mg/dL — ABNORMAL HIGH (ref 65–99)
Potassium: 3.8 mmol/L (ref 3.5–5.2)
Sodium: 138 mmol/L (ref 134–144)

## 2019-07-13 ENCOUNTER — Other Ambulatory Visit: Payer: Self-pay | Admitting: Physician Assistant

## 2019-07-13 ENCOUNTER — Encounter: Payer: Self-pay | Admitting: Gastroenterology

## 2019-07-17 ENCOUNTER — Ambulatory Visit (AMBULATORY_SURGERY_CENTER): Payer: BC Managed Care – PPO | Admitting: Gastroenterology

## 2019-07-17 ENCOUNTER — Encounter: Payer: Self-pay | Admitting: Gastroenterology

## 2019-07-17 ENCOUNTER — Other Ambulatory Visit: Payer: Self-pay

## 2019-07-17 VITALS — BP 116/59 | HR 87 | Temp 97.1°F | Resp 13 | Ht 63.0 in | Wt 150.0 lb

## 2019-07-17 DIAGNOSIS — D509 Iron deficiency anemia, unspecified: Secondary | ICD-10-CM

## 2019-07-17 DIAGNOSIS — K2950 Unspecified chronic gastritis without bleeding: Secondary | ICD-10-CM | POA: Diagnosis not present

## 2019-07-17 DIAGNOSIS — K2951 Unspecified chronic gastritis with bleeding: Secondary | ICD-10-CM | POA: Diagnosis not present

## 2019-07-17 DIAGNOSIS — K3189 Other diseases of stomach and duodenum: Secondary | ICD-10-CM | POA: Diagnosis not present

## 2019-07-17 DIAGNOSIS — K640 First degree hemorrhoids: Secondary | ICD-10-CM | POA: Diagnosis not present

## 2019-07-17 DIAGNOSIS — R195 Other fecal abnormalities: Secondary | ICD-10-CM | POA: Diagnosis not present

## 2019-07-17 DIAGNOSIS — D123 Benign neoplasm of transverse colon: Secondary | ICD-10-CM

## 2019-07-17 DIAGNOSIS — K219 Gastro-esophageal reflux disease without esophagitis: Secondary | ICD-10-CM

## 2019-07-17 DIAGNOSIS — Z86010 Personal history of colonic polyps: Secondary | ICD-10-CM

## 2019-07-17 HISTORY — PX: UPPER GASTROINTESTINAL ENDOSCOPY: SHX188

## 2019-07-17 HISTORY — PX: COLONOSCOPY: SHX174

## 2019-07-17 MED ORDER — SODIUM CHLORIDE 0.9 % IV SOLN: 500.0000 mL | Freq: Once | INTRAVENOUS | Status: DC

## 2019-07-17 NOTE — Progress Notes (Signed)
Called to room to assist during endoscopic procedure.  Patient ID and intended procedure confirmed with present staff. Received instructions for my participation in the procedure from the performing physician.  

## 2019-07-17 NOTE — Progress Notes (Signed)
Report to PACU, RN, vss, BBS= Clear.  

## 2019-07-17 NOTE — Patient Instructions (Signed)
YOU HAD AN ENDOSCOPIC PROCEDURE TODAY AT THE  ENDOSCOPY CENTER:   Refer to the procedure report that was given to you for any specific questions about what was found during the examination.  If the procedure report does not answer your questions, please call your gastroenterologist to clarify.  If you requested that your care partner not be given the details of your procedure findings, then the procedure report has been included in a sealed envelope for you to review at your convenience later.  **Handouts given on polyps and hemorrhoids**  YOU SHOULD EXPECT: Some feelings of bloating in the abdomen. Passage of more gas than usual.  Walking can help get rid of the air that was put into your GI tract during the procedure and reduce the bloating. If you had a lower endoscopy (such as a colonoscopy or flexible sigmoidoscopy) you may notice spotting of blood in your stool or on the toilet paper. If you underwent a bowel prep for your procedure, you may not have a normal bowel movement for a few days.  Please Note:  You might notice some irritation and congestion in your nose or some drainage.  This is from the oxygen used during your procedure.  There is no need for concern and it should clear up in a day or so.  SYMPTOMS TO REPORT IMMEDIATELY:  Following lower endoscopy (colonoscopy or flexible sigmoidoscopy):  Excessive amounts of blood in the stool  Significant tenderness or worsening of abdominal pains  Swelling of the abdomen that is new, acute  Fever of 100F or higher  Following upper endoscopy (EGD)  Vomiting of blood or coffee ground material  New chest pain or pain under the shoulder blades  Painful or persistently difficult swallowing  New shortness of breath  Fever of 100F or higher  Black, tarry-looking stools  For urgent or emergent issues, a gastroenterologist can be reached at any hour by calling [phone]. Do not use MyChart messaging for urgent concerns.    DIET:   We do recommend a small meal at first, but then you may proceed to your regular diet.  Drink plenty of fluids but you should avoid alcoholic beverages for 24 hours.  ACTIVITY:  You should plan to take it easy for the rest of today and you should NOT DRIVE or use heavy machinery until tomorrow (because of the sedation medicines used during the test).    FOLLOW UP: Our staff will call the number listed on your records 48-72 hours following your procedure to check on you and address any questions or concerns that you may have regarding the information given to you following your procedure. If we do not reach you, we will leave a message.  We will attempt to reach you two times.  During this call, we will ask if you have developed any symptoms of COVID 19. If you develop any symptoms (ie: fever, flu-like symptoms, shortness of breath, cough etc.) before then, please call [phone].  If you test positive for Covid 19 in the 2 weeks post procedure, please call and report this information to us.    If any biopsies were taken you will be contacted by phone or by letter within the next 1-3 weeks.  Please call us at [phone] if you have not heard about the biopsies in 3 weeks.    SIGNATURES/CONFIDENTIALITY: You and/or your care partner have signed paperwork which will be entered into your electronic medical record.  These signatures attest to the fact that   fact that that the information above on your After Visit Summary has been reviewed and is understood.  Full responsibility of the confidentiality of this discharge information lies with you and/or your care-partner.

## 2019-07-17 NOTE — Op Note (Signed)
[facility] Patient Name: Katelyn Cooper Procedure Date: 07/17/2019 3:00 PM MRN: [id number] Endoscopist: Ladene Artist , MD Age: 64 Referring MD:  Date of Birth: May 11, 1955 Gender: Female Account #: [id number] Procedure:                Colonoscopy Indications:              Heme positive stool, Unexplained iron deficiency                            anemia, Personal history of adenomatous colon                            polyps. Medicines:                Monitored Anesthesia Care Procedure:                Pre-Anesthesia Assessment:                           - Prior to the procedure, a History and Physical                            was performed, and patient medications and                            allergies were reviewed. The patient's tolerance of                            previous anesthesia was also reviewed. The risks                            and benefits of the procedure and the sedation                            options and risks were discussed with the patient.                            All questions were answered, and informed consent                            was obtained. Prior Anticoagulants: The patient has                            taken no previous anticoagulant or antiplatelet                            agents. ASA Grade Assessment: II - A patient with                            mild systemic disease. After reviewing the risks                            and benefits, the patient was deemed in  satisfactory condition to undergo the procedure.                           After obtaining informed consent, the colonoscope                            was passed under direct vision. Throughout the                            procedure, the patient's blood pressure, pulse, and                            oxygen saturations were monitored continuously. The                            Colonoscope was introduced through the anus and                    advanced to the the cecum, identified by                            appendiceal orifice and ileocecal valve. The                            ileocecal valve, appendiceal orifice, and rectum                            were photographed. The quality of the bowel                            preparation was adequate. The colonoscopy was                            performed without difficulty. The patient tolerated                            the procedure well. Scope In: 3:03:09 PM Scope Out: 3:24:40 PM Scope Withdrawal Time: 0 hours 16 minutes 4 seconds  Total Procedure Duration: 0 hours 21 minutes 31 seconds  Findings:                 The perianal and digital rectal examinations were                            normal.                           A 7 mm polyp was found in the transverse colon. The                            polyp was sessile. The polyp was removed with a                            cold snare. Resection and retrieval were complete.  Internal hemorrhoids were found. The hemorrhoids                            were small and Grade I (internal hemorrhoids that                            do not prolapse).                           The exam was otherwise without abnormality on                            direct views. Unable to safely retroflex with                            narrow rectal vault. Complications:            No immediate complications. Estimated blood loss:                            None. Estimated Blood Loss:     Estimated blood loss: none. Impression:               - One 7 mm polyp in the transverse colon, removed                            with a cold snare. Resected and retrieved.                           - Internal hemorrhoids.                           - The examination was otherwise normal on direct                            views. Recommendation:           - Repeat colonoscopy after studies are complete for                             surveillance based on pathology results.                           - Patient has a contact number available for                            emergencies. The signs and symptoms of potential                            delayed complications were discussed with the                            patient. Return to normal activities tomorrow.                            Written discharge instructions were provided to the  patient.                           - Resume previous diet.                           - Continue present medications.                           - Await pathology results. Ladene Artist, MD 07/17/2019 3:39:49 PM This report has been signed electronically.

## 2019-07-17 NOTE — Op Note (Signed)
[facility] Patient Name: Katelyn Cooper Procedure Date: 07/17/2019 3:00 PM MRN: [id number] Endoscopist: Ladene Artist , MD Age: 64 Referring MD:  Date of Birth: 09-21-1955 Gender: Female Account #: [id number] Procedure:                Upper GI endoscopy Indications:              Iron deficiency anemia, Heme positive stool Medicines:                Monitored Anesthesia Care Procedure:                Pre-Anesthesia Assessment:                           - Prior to the procedure, a History and Physical                            was performed, and patient medications and                            allergies were reviewed. The patient's tolerance of                            previous anesthesia was also reviewed. The risks                            and benefits of the procedure and the sedation                            options and risks were discussed with the patient.                            All questions were answered, and informed consent                            was obtained. Prior Anticoagulants: The patient has                            taken no previous anticoagulant or antiplatelet                            agents. ASA Grade Assessment: II - A patient with                            mild systemic disease. After reviewing the risks                            and benefits, the patient was deemed in                            satisfactory condition to undergo the procedure.                           After obtaining informed consent, the endoscope was  passed under direct vision. Throughout the                            procedure, the patient's blood pressure, pulse, and                            oxygen saturations were monitored continuously. The                            Endoscope was introduced through the mouth, and                            advanced to the second part of duodenum. The upper                            GI endoscopy  was accomplished without difficulty.                            The patient tolerated the procedure well. Scope In: Scope Out: Findings:                 The examined esophagus was normal.                           Patchy moderate inflammation characterized by                            erythema, friability and granularity was found in                            the gastric body and in the gastric antrum.                            Biopsies were taken with a cold forceps for                            histology.                           The exam of the stomach was otherwise normal.                           Patchy mildly erythematous mucosa without active                            bleeding and with no stigmata of bleeding was found                            in the duodenal bulb. Biopsies were taken with a                            cold forceps for histology.                           The exam of the duodenum was  otherwise normal.                            Biopsied obtained for celiac disease. Complications:            No immediate complications. Estimated Blood Loss:     Estimated blood loss: none. Estimated blood loss                            was minimal. Impression:               - Normal esophagus.                           - Gastritis. Biopsied.                           - Erythematous duodenopathy. Biopsied. Recommendation:           - Patient has a contact number available for                            emergencies. The signs and symptoms of potential                            delayed complications were discussed with the                            patient. Return to normal activities tomorrow.                            Written discharge instructions were provided to the                            patient.                           - Resume previous diet.                           - Continue present medications.                           - Await pathology results.                            - If iron deficiency is recurrent consider VCE. Ladene Artist, MD 07/17/2019 3:44:58 PM This report has been signed electronically.

## 2019-07-20 ENCOUNTER — Other Ambulatory Visit: Payer: Self-pay | Admitting: Physician Assistant

## 2019-07-21 ENCOUNTER — Other Ambulatory Visit: Payer: Self-pay | Admitting: Physician Assistant

## 2019-07-21 ENCOUNTER — Telehealth: Payer: Self-pay | Admitting: *Deleted

## 2019-07-21 NOTE — Telephone Encounter (Signed)
  Follow up Call-  Call back number 07/17/2019 03/05/2017  Post procedure Call Back phone  # [phone]  Permission to leave phone message Yes Yes  Some recent data might be hidden     Patient questions:  Do you have a fever, pain , or abdominal swelling? No. Pain Score  0 *  Have you tolerated food without any problems? Yes.    Have you been able to return to your normal activities? Yes.    Do you have any questions about your discharge instructions: Diet   No. Medications  No. Follow up visit  No.  Do you have questions or concerns about your Care? No.  Actions: * If pain score is 4 or above: No action needed, pain <4.  1. Have you developed a fever since your procedure? no  2.   Have you had an respiratory symptoms (SOB or cough) since your procedure? no  3.   Have you tested positive for COVID 19 since your procedure no  4.   Have you had any family members/close contacts diagnosed with the COVID 19 since your procedure? no   If yes to any of these questions please route to Joylene John, RN and Erenest Rasher, RN

## 2019-07-23 ENCOUNTER — Ambulatory Visit: Payer: 59 | Admitting: Psychiatry

## 2019-07-23 ENCOUNTER — Encounter: Payer: Self-pay | Admitting: Psychiatry

## 2019-07-23 ENCOUNTER — Telehealth (INDEPENDENT_AMBULATORY_CARE_PROVIDER_SITE_OTHER): Payer: BC Managed Care – PPO | Admitting: Psychiatry

## 2019-07-23 ENCOUNTER — Other Ambulatory Visit: Payer: Self-pay

## 2019-07-23 DIAGNOSIS — F3170 Bipolar disorder, currently in remission, most recent episode unspecified: Secondary | ICD-10-CM | POA: Diagnosis not present

## 2019-07-23 DIAGNOSIS — F4310 Post-traumatic stress disorder, unspecified: Secondary | ICD-10-CM

## 2019-07-23 MED ORDER — ZALEPLON 10 MG PO CAPS: 10.0000 mg | ORAL_CAPSULE | Freq: Every evening | ORAL | 2 refills | Status: DC | PRN

## 2019-07-23 MED ORDER — VENLAFAXINE HCL ER 75 MG PO CP24: 75.0000 mg | ORAL_CAPSULE | Freq: Every day | ORAL | 1 refills | Status: DC

## 2019-07-23 MED ORDER — QUETIAPINE FUMARATE 100 MG PO TABS: 100.0000 mg | ORAL_TABLET | Freq: Every day | ORAL | 1 refills | Status: DC

## 2019-07-23 NOTE — Progress Notes (Signed)
[facility] MD OP Progress Note  I connected with  Marlin Nakao on 07/23/19 by a video enabled telemedicine application and verified that I am speaking with the correct person using two identifiers.   I discussed the limitations of evaluation and management by telemedicine. The patient expressed understanding and agreed to proceed.    07/23/2019 11:36 AM Runette Sontag  MRN:  [id number]  Chief Complaint:  " I am doing fine."  HPI: Pt reported she has been doing well. She informed she got her COVID 19 vaccine and that he been reassuring. She has now started to go out for groceries and basic errands. She stated that she had started to feel trapped and confined and now being able to step out of the house is comforting for her. She denied any significant issues or concerns at this time.  Visit Diagnosis:    ICD-10-CM   1. Bipolar disorder in remission ([place])  F31.70   2. Post traumatic stress disorder (PTSD)  F43.10     Past Psychiatric History: PTSD, Bipolar d/o  Past Medical History:  Past Medical History:  Diagnosis Date  . (HFpEF) heart failure with preserved ejection fraction ([place])    a. 12/2015 Echo: EF 55-60%, nl RV size/fxn, no significant valvular abnormalities; b. 10/2017 Echo: EF 50-55%, antsept, ant HK. Gr2 DD. Mild MR. Nl RV fxn. PASP 50mmHg.  [name] Anxiety   . CHF (congestive heart failure) ([place])   . Depression   . Diabetes type 2, controlled ([place])    diet-controlled  . Esophageal stricture   . GERD (gastroesophageal reflux disease)   . Hiatal hernia   . History of chicken pox   . Hyperlipidemia   . Hypertension   . Internal hemorrhoids   . Non-obstructive CAD (coronary artery disease)    a. 12/2015 MV: apical ant and apical reversible defect. EF 55-65%; b. 01/2016 Cath: LM nl, LAD nl, SP1 40ost, LCX nl, OM1 30, RCA 30p/m, EDP 15-70mmHg; c. 10/2017 Cath: LM nl, LAD nl, SP1 30ost, LCX nl, OM1 30, RCA 30p/m. EF 55%.  . Obstructive sleep apnea   . PAD (peripheral artery disease)  ([place])    a. 1990's s/p prior LCIA stenting; b. 12/2015 ABI: R 1.05, L 1.03.  . Panic disorder     Past Surgical History:  Procedure Laterality Date  . [place]   stent placement; ? left iliac artery intervention  . APPENDECTOMY  1998  . BASAL CELL CARCINOMA EXCISION    . CARDIAC CATHETERIZATION N/A 01/12/2016   Procedure: Left Heart Cath and Coronary Angiography;  Surgeon: Nelva Bush, MD;  Location: [facility];  Service: Cardiovascular;  Laterality: N/A;  . COLONOSCOPY  07/17/2019  . FOOT SURGERY     Right  . pap smear  03/2010   Done by [name], normal  . REFRACTIVE SURGERY     right  . RIB RESECTION  [id number]  . RIGHT/LEFT HEART CATH AND CORONARY ANGIOGRAPHY N/A 11/08/2017   Procedure: RIGHT/LEFT HEART CATH AND CORONARY ANGIOGRAPHY;  Surgeon: Wellington Hampshire, MD;  Location: [facility];  Service: Cardiovascular;  Laterality: N/A;  . SHOULDER SURGERY  2005   left  . UPPER GASTROINTESTINAL ENDOSCOPY  07/17/2019  . UPPER GI ENDOSCOPY  09/19/2011   Stricture at GE junction, dilated. Mild gastritis and duodenitis. Small hiatal hernia    Family Psychiatric History: sister- bipolar d/o,mom - depression  Family History:  Family History  Problem Relation Age of Onset  .  Lung cancer Mother        was a smoker  . Emphysema Mother   . Cancer Mother        Lung  . Alcohol abuse Mother   . Depression Mother   . Heart disease Mother   . Drug abuse Sister   . Breast cancer Sister        x 2  (30&50)  . Emphysema Sister   . Bipolar disorder Sister   . Alcohol abuse Other   . Depression Other   . Bipolar disorder Other   . Heart disease Other   . Vision loss Other   . Alcohol abuse Father   . Mood Disorder Father   . Heart disease Maternal Grandmother   . Stroke Maternal Grandmother   . Esophageal cancer Maternal Aunt   . Colon cancer Neg Hx   . Colon polyps Neg Hx   . Rectal cancer Neg Hx   . Stomach cancer Neg Hx     Social  History:  Social History   Socioeconomic History  . Marital status: Married    Spouse name: mark  . Number of children: 0  . Years of education: Not on file  . Highest education level: High school graduate  Occupational History  . Occupation: computer-retired    Employer: [employer]  Tobacco Use  . Smoking status: Former Smoker    Packs/day: 1.00    Years: 40.00    Pack years: 40.00    Types: Cigarettes    Start date: 03/01/1975    Quit date: 08/11/2017    Years since quitting: 1.9  . Smokeless tobacco: Never Used  Substance and Sexual Activity  . Alcohol use: No    Alcohol/week: 0.0 standard drinks  . Drug use: No  . Sexual activity: Not Currently  Other Topics Concern  . Not on file  Social History Narrative  . Not on file   Social Determinants of Health   Financial Resource Strain:   . Difficulty of Paying Living Expenses:   Food Insecurity:   . Worried About Charity fundraiser in the Last Year:   . Arboriculturist in the Last Year:   Transportation Needs:   . Film/video editor (Medical):   [name] Lack of Transportation (Non-Medical):   Physical Activity:   . Days of Exercise per Week:   . Minutes of Exercise per Session:   Stress:   . Feeling of Stress :   Social Connections:   . Frequency of Communication with Friends and Family:   . Frequency of Social Gatherings with Friends and Family:   . Attends Religious Services:   . Active Member of Clubs or Organizations:   . Attends Archivist Meetings:   [name] Marital Status:     Allergies:  Allergies  Allergen Reactions  . Lovastatin     depression  . Paroxetine Hcl     itching  . Lamotrigine Rash    Metabolic Disorder Labs: Lab Results  Component Value Date   HGBA1C 7.2 (A) 04/17/2019   No results found for: PROLACTIN Lab Results  Component Value Date   CHOL 160 04/17/2019   TRIG 169 (H) 04/17/2019   HDL 55 04/17/2019   CHOLHDL 2.9 04/17/2019   LDLCALC 76 04/17/2019   LDLCALC 59  03/21/2018   Lab Results  Component Value Date   TSH 1.550 04/17/2019   TSH 1.26 12/10/2013    Therapeutic Level Labs: No results found for: LITHIUM  No results found for: VALPROATE No components found for:  CBMZ  Current Medications: Current Outpatient Medications  Medication Sig Dispense Refill  . acetaminophen (TYLENOL) 500 MG tablet Take 500 mg by mouth every 6 (six) hours as needed.    [name] aspirin EC 81 MG tablet Take 1 tablet (81 mg total) by mouth daily.    [name] atorvastatin (LIPITOR) 40 MG tablet TAKE 1 TABLET BY MOUTH (40MG  TOTAL) DAILY 90 tablet 3  . Cholecalciferol (VITAMIN D3) 5000 units TABS Take 1 tablet by mouth daily.   0  . Ferrous Sulfate (IRON PO) Take by mouth. Taking 1 capsule daily    . furosemide (LASIX) 20 MG tablet Take 1 tablet (20 mg total) by mouth daily. 90 tablet 0  . glucose blood test strip ONETOUCH ULTRA MINI STRIPS AND LANCETS. Use one daily to check blood sugar. 100 each 3  . IRON, FERROUS SULFATE, PO Take by mouth.    . lansoprazole (PREVACID) 15 MG capsule Take 15 mg by mouth daily at 6 (six) AM.    . metFORMIN (GLUCOPHAGE-XR) 500 MG 24 hr tablet Take 1 tablet (500 mg total) by mouth every evening. 90 tablet 1  . metoprolol tartrate (LOPRESSOR) 25 MG tablet Take 1 tablet (25 mg total) by mouth 2 (two) times daily. 180 tablet 3  . Multiple Vitamins-Minerals (MULTI-VITAMIN GUMMIES PO) Take by mouth. Taking 1 daily    . potassium chloride SA (KLOR-CON) 20 MEQ tablet TAKE 2 TABLET BY MOUTH TWICE DAILY 120 tablet 3  . QUEtiapine (SEROQUEL) 100 MG tablet Take 1 tablet (100 mg total) by mouth at bedtime. 90 tablet 1  . venlafaxine XR (EFFEXOR-XR) 75 MG 24 hr capsule Take 1 capsule (75 mg total) by mouth daily with breakfast. 90 capsule 1  . zaleplon (SONATA) 10 MG capsule Take 1 capsule (10 mg total) by mouth at bedtime as needed for sleep. 30 capsule 2   No current facility-administered medications for this visit.     Musculoskeletal: Strength & Muscle  Tone: unable to assess due to telemed visit Gait & Station: unable to assess due to telemed visit Patient leans: unable to assess due to telemed visit   Psychiatric Specialty Exam: ROS  There were no vitals taken for this visit.There is no height or weight on file to calculate BMI.  General Appearance: Fairly Groomed  Eye Contact:  Good  Speech:  Clear and Coherent and Normal Rate  Volume:  Normal  Mood:  Euthymic  Affect:  Congruent  Thought Process:  Goal Directed, Linear and Descriptions of Associations: Intact  Orientation:  Full (Time, Place, and Person)  Thought Content: Logical   Suicidal Thoughts:  No  Homicidal Thoughts:  No  Memory:  Recent;   Good Remote;   Good  Judgement:  Good  Insight:  Good  Psychomotor Activity:  Normal  Concentration:  Concentration: Good and Attention Span: Good  Recall:  Good  Fund of Knowledge: Good  Language: Good  Akathisia:  Negative  Handed:  Right  AIMS (if indicated): not tested  Assets:  Communication Skills Desire for Improvement Financial Resources/Insurance Housing Leisure Time Social Support Talents/Skills Transportation  ADL's:  Intact  Cognition: WNL  Sleep:  Good with help of Sonata   Screenings: AIMS     Office Visit from 03/10/2018 in [facility] Total Score  0    PHQ2-9     Office Visit from 12/31/2017 in [facility] Office Visit  from 11/15/2017 in [facility] Office Visit from 09/20/2016 in [facility]  PHQ-2 Total Score  0  0  0  PHQ-9 Total Score  --  --  3       Assessment and Plan: 64 y/o female with hx of CHF, COPD and iron deficiency anemia, Bipolar d/o and PTSD seen for f/up, now stable on her med regimen.  1. Bipolar disorder in remission (H[facility])  - venlafaxine XR (EFFEXOR-XR) 75 MG 24 hr capsule; Take 1 capsule (75 mg total) by mouth daily with breakfast.  Dispense:  90 capsule; Refill: 1 - QUEtiapine (SEROQUEL) 100 MG tablet; Take 1 tablet (100 mg total) by mouth at bedtime.  Dispense: 90 tablet; Refill: 1 - Continue Sonata 10 mg HS  2. Post traumatic stress disorder (PTSD)  - venlafaxine XR (EFFEXOR-XR) 75 MG 24 hr capsule; Take 1 capsule (75 mg total) by mouth daily with breakfast.  Dispense: 90 capsule; Refill: 1  Continue same regimen. F/up in 3 months.  Nevada Crane, MD 07/23/2019, 11:36 AM

## 2019-07-28 ENCOUNTER — Encounter: Payer: Self-pay | Admitting: Gastroenterology

## 2019-08-20 ENCOUNTER — Other Ambulatory Visit: Payer: Self-pay | Admitting: Internal Medicine

## 2019-08-20 MED ORDER — FUROSEMIDE 20 MG PO TABS: 20.0000 mg | ORAL_TABLET | Freq: Every day | ORAL | 0 refills | Status: DC

## 2019-08-20 NOTE — Telephone Encounter (Signed)
*  STAT* If patient is at the pharmacy, call can be transferred to refill team.   1. Which medications need to be refilled? (please list name of each medication and dose if known) furosemide (LASIX) 20 MG 1 tablet daily   2. Which pharmacy/location (including street and city if local pharmacy) is medication to be sent to? [facility]  3. Do they need a 30 day or 90 day supply? 90 day

## 2019-08-20 NOTE — Telephone Encounter (Signed)
Requested Prescriptions   Signed Prescriptions Disp Refills   furosemide (LASIX) 20 MG tablet 90 tablet 0    Sig: Take 1 tablet (20 mg total) by mouth daily.    Authorizing Provider: Rise Mu    Ordering User: Raelene Bott, Alzada Brazee L

## 2019-08-24 ENCOUNTER — Other Ambulatory Visit: Payer: Self-pay

## 2019-08-24 MED ORDER — POTASSIUM CHLORIDE CRYS ER 20 MEQ PO TBCR: 40.0000 meq | EXTENDED_RELEASE_TABLET | Freq: Two times a day (BID) | ORAL | 0 refills | Status: DC

## 2019-09-02 ENCOUNTER — Encounter: Payer: Self-pay | Admitting: Internal Medicine

## 2019-09-02 ENCOUNTER — Ambulatory Visit (INDEPENDENT_AMBULATORY_CARE_PROVIDER_SITE_OTHER): Payer: BC Managed Care – PPO | Admitting: Internal Medicine

## 2019-09-02 ENCOUNTER — Other Ambulatory Visit: Payer: Self-pay

## 2019-09-02 VITALS — BP 102/62 | HR 85 | Temp 97.9°F | Ht 63.0 in | Wt 150.0 lb

## 2019-09-02 DIAGNOSIS — J449 Chronic obstructive pulmonary disease, unspecified: Secondary | ICD-10-CM | POA: Diagnosis not present

## 2019-09-02 DIAGNOSIS — G4733 Obstructive sleep apnea (adult) (pediatric): Secondary | ICD-10-CM

## 2019-09-02 DIAGNOSIS — R911 Solitary pulmonary nodule: Secondary | ICD-10-CM | POA: Diagnosis not present

## 2019-09-02 NOTE — Progress Notes (Signed)
[place] [facility] Consultation      I connected with the patient by telephone enabled telemedicine visit and verified that I am speaking with the correct person using two identifiers.    I discussed the limitations, risks, security and privacy concerns of performing an evaluation and management service by telemedicine and the availability of in-person appointments. I also discussed with the patient that there may be a patient responsible charge related to this service. The patient expressed understanding and agreed to proceed.  PATIENT AGREES AND CONFIRMS -YES   Other persons participating in the visit and their role in the encounter: Patient, nursing  This visit type was conducted due to national recommendations for restrictions regarding the COVID-19 Pandemic (e.g. social distancing).  This format is felt to be most appropriate for this patient at this time.  All issues noted in this document were discussed and addressed.        Date: 09/02/2019,   MRN# [id number] Katelyn Cooper [facility] 07/20/1955     AdmissionWeight: 150 lb (68 kg)                 CurrentWeight: 150 lb (68 kg) Katelyn Cooper is a 64 y.o. old female seen in consultation for cough, pleuritic chest pain. Previous BQ patient   PFT FINDINGS PFT's 08/2015 reviewed with patient ratio 80% Fev1 2.2 L 87% FVC 2.6L 83%  Ct chest 08/30/15 Left lung Nodule approx 6 MM increased from 2-3 MM from 2013.    CT chest 12/2015 Left Lung nodule approx 6 MM-no change from previous CT scans   CT chest 09/05/16 There is a right lower lobe small opacity probable mucus plugging mucoid impaction  CT chest 3.7.19 The left lower lobe nodule has not increased in size significantly from previous CAT scan  CT chest 8/19 Edema and effusions Left Nodule stable  findings reveiwed with patient   ECHO 8/19 left ventricular filling pattern, with   concomitant abnormal relaxation and increased filling pressure   (grade 2 diastolic  dysfunction).    CC-, follow-up OSA follow-up COPD,Follow-up CT chest  CC CT cheFCCollow-up OSA HISTORY OF PRESENT ILLNESS    OSA seems to be under control 100% compliance for days 77 compliance for greater than 4 hours  AHI is 3.9  Currently on auto CPAP 5 to 20 cm water pressure    CT chest lung cancer screening follow-up February 2021 Stable 5 mm lung nodule Continue protocol as scheduled   COPD-stable No exacerbation at this time No evidence of heart failure at this time No evidence or signs of infection at this time No respiratory distress No fevers, chills, nausea, vomiting, diarrhea No evidence of lower extremity edema No evidence hemoptysis Intermittent albuterol use No maintenance therapy at this time Quit tobacco several years ago     Current Medication:   Current Outpatient Medications:  .  acetaminophen (TYLENOL) 500 MG tablet, Take 500 mg by mouth every 6 (six) hours as needed., Disp: , Rfl:  .  aspirin EC 81 MG tablet, Take 1 tablet (81 mg total) by mouth daily., Disp: , Rfl:  .  atorvastatin (LIPITOR) 40 MG tablet, TAKE 1 TABLET BY MOUTH (40MG  TOTAL) DAILY, Disp: 90 tablet, Rfl: 3 .  Cholecalciferol (VITAMIN D3) 5000 units TABS, Take 1 tablet by mouth daily. , Disp: , Rfl: 0 .  Ferrous Sulfate (IRON PO), Take by mouth. Taking 1 capsule daily, Disp: , Rfl:  .  furosemide (LASIX) 20 MG tablet, Take 1 tablet (20  mg total) by mouth daily., Disp: 90 tablet, Rfl: 0 .  glucose blood test strip, ONETOUCH ULTRA MINI STRIPS AND LANCETS. Use one daily to check blood sugar., Disp: 100 each, Rfl: 3 .  IRON, FERROUS SULFATE, PO, Take by mouth., Disp: , Rfl:  .  lansoprazole (PREVACID) 15 MG capsule, Take 15 mg by mouth daily at 6 (six) AM., Disp: , Rfl:  .  metFORMIN (GLUCOPHAGE-XR) 500 MG 24 hr tablet, Take 1 tablet (500 mg total) by mouth every evening., Disp: 90 tablet, Rfl: 1 .  Multiple Vitamins-Minerals (MULTI-VITAMIN GUMMIES PO), Take by mouth. Taking 1  daily, Disp: , Rfl:  .  potassium chloride SA (KLOR-CON) 20 MEQ tablet, Take 2 tablets (40 mEq total) by mouth 2 (two) times daily. Needs appointment for further refills. Thank you!, Disp: 360 tablet, Rfl: 0 .  QUEtiapine (SEROQUEL) 100 MG tablet, Take 1 tablet (100 mg total) by mouth at bedtime., Disp: 90 tablet, Rfl: 1 .  venlafaxine XR (EFFEXOR-XR) 75 MG 24 hr capsule, Take 1 capsule (75 mg total) by mouth daily with breakfast., Disp: 90 capsule, Rfl: 1 .  zaleplon (SONATA) 10 MG capsule, Take 1 capsule (10 mg total) by mouth at bedtime as needed for sleep., Disp: 30 capsule, Rfl: 2 .  metoprolol tartrate (LOPRESSOR) 25 MG tablet, Take 1 tablet (25 mg total) by mouth 2 (two) times daily., Disp: 180 tablet, Rfl: 3     ALLERGIES   Lovastatin, Paroxetine hcl, and Lamotrigine     REVIEW OF SYSTEMS   Review of Systems  Constitutional: Negative for chills, fever, malaise/fatigue and weight loss.  HENT: Negative for congestion.   Respiratory: Negative for cough, hemoptysis, sputum production, shortness of breath and wheezing.   Cardiovascular: Negative for chest pain, palpitations, orthopnea and leg swelling.  Gastrointestinal: Negative for nausea.  Psychiatric/Behavioral: Depression:    All other systems reviewed and are negative.   BP 102/62 (BP Location: Left Arm, Cuff Size: Normal)   Pulse 85   Temp 97.9 F (36.6 C) (Oral)   Ht 5\' 3"  (1.6 m)   Wt 150 lb (68 kg)   SpO2 95%   BMI 26.57 kg/m    Physical Examination:   General Appearance: No distress  Neuro:without focal findings,  speech normal,  HEENT: PERRLA, EOM intact.   Pulmonary: normal breath sounds, No wheezing.  CardiovascularNormal S1,S2.  No m/r/g.   Abdomen: Benign, Soft, non-tender. Renal:  No costovertebral tenderness  GU:  Not performed at this time. Endoc: No evident thyromegaly Skin:   warm, no rashes, no ecchymosis  Extremities: normal, no cyanosis, clubbing. PSYCHIATRIC: Mood, affect within normal  limits.   ALL OTHER ROS ARE NEGATIVE    ASSESSMENT/PLAN   64 year old female seen today for grade 2 diastolic heart failure with underlying severe sleep apnea which seems to be under control and well-controlled at this time especially in the setting of COPD, patient also enrolled in lung cancer screening program to extensive smoking history in the past    COPD -stable at this time Intermittent shortness of breath and wheezing resolved with albuterol as needed No additional inhalers at this time Patient quit smoking several years ago No evidence of exacerbation at this time  OSA Patient has good compliance report at this time Patient uses and benefits from auto CPAP therapy   Grade 2 diastolic dysfunction Continue Lasix as prescribed Follow-up with cardiology as scheduled   LEFT Lung  Nodule-lung cancer screening protocol CT chest reviewed February 2021 Stable 5 mm  left lung nodule, consistent with benign perifissural lymph node. No active lung disease. Findings reviewed with patient imaging independently reviewed by me today    COVID-19 EDUCATION: The signs and symptoms of COVID-19 were discussed with the patient and how to seek care for testing (follow up with PCP or arrange E-visit).  The importance of social distancing was discussed today.  MEDICATION ADJUSTMENTS/LABS AND TESTS ORDERED: Albuterol as needed Avoid sick contacts Continue auto CPAP therapy  CURRENT MEDICATIONS REVIEWED AT LENGTH WITH PATIENT TODAY   Patient/Family are satisfied with Plan of action and management. All questions answered  Follow up 1 year  Total time spent 32 mins  Maretta Bees Patricia Pesa, M.D.  [facility]  Medical Director [place] Director [facility] Cardio-Pulmonary Department

## 2019-09-02 NOTE — Patient Instructions (Addendum)
Continue CPAP as prescribed Avoid secondhand smoke exposure Exercise as tolerated Follow-up lung cancer screening program

## 2019-09-30 ENCOUNTER — Other Ambulatory Visit: Payer: Self-pay

## 2019-09-30 ENCOUNTER — Encounter: Payer: Self-pay | Admitting: Internal Medicine

## 2019-09-30 ENCOUNTER — Ambulatory Visit (INDEPENDENT_AMBULATORY_CARE_PROVIDER_SITE_OTHER): Payer: BC Managed Care – PPO | Admitting: Internal Medicine

## 2019-09-30 VITALS — BP 114/62 | HR 84 | Ht 63.0 in | Wt 151.2 lb

## 2019-09-30 DIAGNOSIS — I2510 Atherosclerotic heart disease of native coronary artery without angina pectoris: Secondary | ICD-10-CM | POA: Diagnosis not present

## 2019-09-30 DIAGNOSIS — E785 Hyperlipidemia, unspecified: Secondary | ICD-10-CM

## 2019-09-30 DIAGNOSIS — I739 Peripheral vascular disease, unspecified: Secondary | ICD-10-CM | POA: Diagnosis not present

## 2019-09-30 DIAGNOSIS — I5032 Chronic diastolic (congestive) heart failure: Secondary | ICD-10-CM

## 2019-09-30 MED ORDER — ATORVASTATIN CALCIUM 80 MG PO TABS: 80.0000 mg | ORAL_TABLET | Freq: Every day | ORAL | 1 refills | Status: DC

## 2019-09-30 NOTE — Patient Instructions (Signed)
Medication Instructions:  Your physician has recommended you make the following change in your medication:  1- INCREASE Atorvastatin to 80 mg by mouth once a day.  *If you need a refill on your cardiac medications before your next appointment, please call your pharmacy*  Lab Work: Your physician recommends that you return for lab work in: [place] September 30TH TO THE FIRST PART OF October 2021. - Will  Check Lipid panel. - You will need to be fasting. Please do not have anything to eat or drink after midnight the morning you have the lab work. You may only have water or black coffee with no cream or sugar. - Please go to the [facility]. You will check in at the front desk to the right as you walk into the atrium. Valet Parking is offered if needed. - No appointment needed. You may go any day between 7 am and 6 pm.  If you have labs (blood work) drawn today and your tests are completely normal, you will receive your results only by: [name] MyChart Message (if you have MyChart) OR . A paper copy in the mail If you have any lab test that is abnormal or we need to change your treatment, we will call you to review the results.   Testing/Procedures: none   Follow-Up: At [facility], you and your health needs are our priority.  As part of our continuing mission to provide you with exceptional heart care, we have created designated Provider Care Teams.  These Care Teams include your primary Cardiologist (physician) and Advanced Practice Providers (APPs -  Physician Assistants and Nurse Practitioners) who all work together to provide you with the care you need, when you need it.  We recommend signing up for the patient portal called "MyChart".  Sign up information is provided on this After Visit Summary.  MyChart is used to connect with patients for Virtual Visits (Telemedicine).  Patients are able to view lab/test results, encounter notes, upcoming appointments, etc.  Non-urgent  messages can be sent to your provider as well.   To learn more about what you can do with MyChart, go to [email or web address].    Your next appointment:   6 month(s)  The format for your next appointment:   In Person  Provider:    You may see Nelva Bush, MD or one of the following Advanced Practice Providers on your designated Care Team:    Murray Hodgkins, NP  Christell Faith, PA-C  Marrianne Mood, PA-C

## 2019-09-30 NOTE — Progress Notes (Signed)
Follow-up Outpatient Visit Date: 09/30/2019  Primary Care Provider: [name], MD [address] [place] [address]  Chief Complaint: Follow-up HFpEF and PAD  HPI:  Ms. Harwood is a 64 y.o. female with history of nonobstructive CAD, PAD status post left iliac stent,chronicHFpEF,diet-controlled DM2, thoracic outlet syndrome status post bilateral rib resections (6045), GERD complicated by esophageal stricture, HLD, and anxiety/depression, who presents for follow-up of chronic HFpEF.  She was last seen in our office by Christell Faith, PA, in late March.  At that time, she was doing well after we had temporarily increased her furosemide at our last visit with me in February.  No medication changes or additional testing were pursued at that time.  Today, Ms. Burack reports that she feels about the same as at our last visit.  She still has exertional dyspnea, which has been stable.  She is trying to walk 2 miles at least 2 days a week.  She denies chest pain, palpitations, lightheadedness, and edema.  She has not had any claudication.  She is using CPAP regularly.  --------------------------------------------------------------------------------------------------  Cardiovascular History & Procedures: Cardiovascular Problems:  Nonobstructive coronary artery disease  Peripheral vascular status post left iliac stent  HFpEF  Risk Factors:  Diabetes mellitus, hyperlipidemia, peripheral vascular disease  Cath/PCI:  L/RHC (11/08/2017): LMCA with minimal diffuse disease. LAD normal. 30% stenosis at first septal perforator. LCx with mild luminal irregularities. OM1 with 30% disease. RCA with sequential 30% proximal and mid stenoses.  Upper normal filling pressures and normal pulmonary artery pressure with moderately reduced Fick cardiac output (CO 3.1/CI 1.8).  LHC (01/12/16): LMCA and LAD normal. There is a 40% stenosis in the proximal aspect of the large first septal  perforator. There is 30% OM1 stenosis. 30% proximal and mid RCA lesions are also present. LVEDP mildly elevated at 15-20 mmHg.  CV Surgery:  Remote left iliac stent.  EP Procedures and Devices:  None.  Non-Invasive Evaluation(s):  TTE (04/14/2019): Normal LV size and wall thickness with LVEF 60-65%. There is grade 1 diastolic dysfunction with elevated filling pressures. Normal RV size and function. No significant valvular abnormality. Normal CVP. Unable to assess PA pressure.  Pharmacologic MPI (03/30/2019): Low risk study withotu ischemia or scar. Coronary artery calcification noted. LVEF >65%.  TTE (11/06/2017): Normal LV size and wall thickness. LVEF 50-55% with hypokinesis of the anteroseptal and anterior myocardium. Grade 2 diastolic dysfunction with elevated filling pressures. Mild MR. Normal RV size and function. Mild pulmonary hypertension (PASP 35 mmHg).  Pharmacologic myocardial perfusion stress test (12/23/15): Small, mild area of reversible defect involving the apical anterior and apical segments that could reflect ischemia or breast attenuation. LVEF 55-65%. Borderline TID was noted.  Transthoracic echocardiogram (12/28/15): Normal LV systolic and diastolic function (EF 40-98%). No significant valvular abnormalities. Normal RV size and function.  Aortoiliac and lower extremity Dopplers:Patentstent in the left common iliac artery. No significant abnormalities. ABI right 1.05, left 1.03.  Recent CV Pertinent Labs: Lab Results  Component Value Date   CHOL 160 04/17/2019   HDL 55 04/17/2019   LDLCALC 76 04/17/2019   TRIG 169 (H) 04/17/2019   CHOLHDL 2.9 04/17/2019   INR 1.06 01/06/2016   BNP 731.0 (H) 11/05/2017   K 3.8 06/26/2019   MG 2.5 (H) 11/08/2017   BUN 8 06/26/2019   CREATININE 0.82 06/26/2019    Past medical and surgical history were reviewed and updated in EPIC.  Current Meds  Medication Sig  . acetaminophen (TYLENOL) 500 MG  tablet Take 500  mg by mouth every 6 (six) hours as needed.  [name] aspirin EC 81 MG tablet Take 1 tablet (81 mg total) by mouth daily.  . Cholecalciferol (VITAMIN D3) 5000 units TABS Take 1 tablet by mouth daily.   . Ferrous Sulfate (IRON PO) Take by mouth. Taking 1 capsule daily  . furosemide (LASIX) 20 MG tablet Take 1 tablet (20 mg total) by mouth daily.  [name] glucose blood test strip ONETOUCH ULTRA MINI STRIPS AND LANCETS. Use one daily to check blood sugar.  . lansoprazole (PREVACID) 15 MG capsule Take 15 mg by mouth daily at 6 (six) AM.  . metFORMIN (GLUCOPHAGE-XR) 500 MG 24 hr tablet Take 1 tablet (500 mg total) by mouth every evening.  . metoprolol tartrate (LOPRESSOR) 25 MG tablet Take 1 tablet (25 mg total) by mouth 2 (two) times daily.  . Multiple Vitamins-Minerals (MULTI-VITAMIN GUMMIES PO) Take by mouth. Taking 1 daily  . potassium chloride SA (KLOR-CON) 20 MEQ tablet Take 2 tablets (40 mEq total) by mouth 2 (two) times daily. Needs appointment for further refills. Thank you!  [name] QUEtiapine (SEROQUEL) 100 MG tablet Take 1 tablet (100 mg total) by mouth at bedtime.  [name] venlafaxine XR (EFFEXOR-XR) 75 MG 24 hr capsule Take 1 capsule (75 mg total) by mouth daily with breakfast.  . zaleplon (SONATA) 10 MG capsule Take 1 capsule (10 mg total) by mouth at bedtime as needed for sleep.  . [DISCONTINUED] atorvastatin (LIPITOR) 40 MG tablet TAKE 1 TABLET BY MOUTH (40MG  TOTAL) DAILY    Allergies: Lovastatin, Paroxetine hcl, and Lamotrigine  Social History   Tobacco Use  . Smoking status: Former Smoker    Packs/day: 1.00    Years: 40.00    Pack years: 40.00    Types: Cigarettes    Start date: 03/01/1975    Quit date: 08/11/2017    Years since quitting: 2.1  . Smokeless tobacco: Never Used  Vaping Use  . Vaping Use: Former  Substance Use Topics  . Alcohol use: No    Alcohol/week: 0.0 standard drinks  . Drug use: No    Family History  Problem Relation Age of Onset  . Lung cancer Mother        was a  smoker  . Emphysema Mother   . Cancer Mother        Lung  . Alcohol abuse Mother   . Depression Mother   . Heart disease Mother   . Drug abuse Sister   . Breast cancer Sister        x 2  (30&50)  . Emphysema Sister   . Bipolar disorder Sister   . Alcohol abuse Other   . Depression Other   . Bipolar disorder Other   . Heart disease Other   . Vision loss Other   . Alcohol abuse Father   . Mood Disorder Father   . Heart disease Maternal Grandmother   . Stroke Maternal Grandmother   . Esophageal cancer Maternal Aunt   . Colon cancer Neg Hx   . Colon polyps Neg Hx   . Rectal cancer Neg Hx   . Stomach cancer Neg Hx     Review of Systems: A 12-system review of systems was performed and was negative except as noted in the HPI.  --------------------------------------------------------------------------------------------------  Physical Exam: BP 114/62 (BP Location: Left Arm, Patient Position: Sitting, Cuff Size: Normal)   Pulse 84   Ht 5\' 3"  (1.6 m)   Wt 151 lb 4  oz (68.6 kg)   SpO2 96%   BMI 26.79 kg/m   General: NAD. Neck: No JVD or HJR. Lungs: Clear to auscultation without wheezes or crackles. Heart: Regular rate and rhythm without murmurs, rubs, or gallops. Abdomen: Soft, nontender, nondistended. Extremities: No lower extremity edema.  2+ pedal pulses bilaterally.  EKG: Normal sinus rhythm with left bundle branch block.  LBBB is new since 06/26/2019 but has been seen on prior tracings as recently as 04/17/2019.  Lab Results  Component Value Date   WBC 7.4 04/17/2019   HGB 11.1 05/27/2019   HCT 28.6 (L) 04/17/2019   MCV 75 (L) 04/17/2019   PLT 210 04/17/2019    Lab Results  Component Value Date   NA 138 06/26/2019   K 3.8 06/26/2019   CL 101 06/26/2019   CO2 20 06/26/2019   BUN 8 06/26/2019   CREATININE 0.82 06/26/2019   GLUCOSE 170 (H) 06/26/2019   ALT 19 04/17/2019    Lab Results  Component Value Date   CHOL 160 04/17/2019   HDL 55 04/17/2019    LDLCALC 76 04/17/2019   TRIG 169 (H) 04/17/2019   CHOLHDL 2.9 04/17/2019    --------------------------------------------------------------------------------------------------  ASSESSMENT AND PLAN: Chronic HFpEF: Ms. Benedetto Coons appears euvolemic and well compensated with stable NYHA class II symptoms.  I have encouraged her to continue exercising.  She should continue current regimen of furosemide 20 mg daily and metoprolol tartrate 25 mg twice daily.  Nonobstructive coronary artery disease: No symptoms to suggest worsening coronary insufficiency.  EKG today demonstrates LBBB, which is new from her most recent prior tracing but has been seen intermittently in the past.  Continue medical therapy to prevent progression of disease, including aspirin, metoprolol, and high intensity statin therapy (see details below.  Hyperlipidemia: In the setting of nonobstructive CAD as well as PAD with prior iliac revascularization, target LDL is less than 70.  Most recent lipid panel was suboptimal with an LDL of 76.  We have discussed importance of lifestyle modifications.  We have agreed to increase atorvastatin to 80 mg daily as well.  We will plan to repeat a lipid panel in 3 months to assess response.  PAD: No signs or symptoms of worsening lower extremity arterial insufficiency.  Pedal pulses are normal on exam today.  Continue current medications for secondary prevention with escalation of statin therapy, as outlined above.  Follow-up: Return to clinic in 6 months.  Nelva Bush, MD 10/01/2019 11:18 AM

## 2019-10-01 ENCOUNTER — Encounter: Payer: Self-pay | Admitting: Internal Medicine

## 2019-10-16 ENCOUNTER — Other Ambulatory Visit: Payer: Self-pay

## 2019-10-16 ENCOUNTER — Encounter: Payer: Self-pay | Admitting: Family Medicine

## 2019-10-16 ENCOUNTER — Ambulatory Visit (INDEPENDENT_AMBULATORY_CARE_PROVIDER_SITE_OTHER): Payer: BC Managed Care – PPO | Admitting: Family Medicine

## 2019-10-16 ENCOUNTER — Telehealth (INDEPENDENT_AMBULATORY_CARE_PROVIDER_SITE_OTHER): Payer: BC Managed Care – PPO | Admitting: Psychiatry

## 2019-10-16 ENCOUNTER — Encounter (HOSPITAL_COMMUNITY): Payer: Self-pay | Admitting: Psychiatry

## 2019-10-16 ENCOUNTER — Telehealth: Payer: BC Managed Care – PPO | Admitting: Psychiatry

## 2019-10-16 VITALS — BP 106/64 | HR 76 | Temp 96.9°F | Ht 63.0 in | Wt 153.6 lb

## 2019-10-16 DIAGNOSIS — K130 Diseases of lips: Secondary | ICD-10-CM | POA: Diagnosis not present

## 2019-10-16 DIAGNOSIS — F4310 Post-traumatic stress disorder, unspecified: Secondary | ICD-10-CM | POA: Diagnosis not present

## 2019-10-16 DIAGNOSIS — I10 Essential (primary) hypertension: Secondary | ICD-10-CM | POA: Diagnosis not present

## 2019-10-16 DIAGNOSIS — F3170 Bipolar disorder, currently in remission, most recent episode unspecified: Secondary | ICD-10-CM

## 2019-10-16 DIAGNOSIS — E119 Type 2 diabetes mellitus without complications: Secondary | ICD-10-CM | POA: Diagnosis not present

## 2019-10-16 LAB — POCT GLYCOSYLATED HEMOGLOBIN (HGB A1C)
Est. average glucose Bld gHb Est-mCnc: 146
Hemoglobin A1C: 6.7 % — AB (ref 4.0–5.6)

## 2019-10-16 MED ORDER — ZALEPLON 10 MG PO CAPS: 10.0000 mg | ORAL_CAPSULE | Freq: Every evening | ORAL | 2 refills | Status: DC | PRN

## 2019-10-16 MED ORDER — VENLAFAXINE HCL ER 75 MG PO CP24: 75.0000 mg | ORAL_CAPSULE | Freq: Every day | ORAL | 1 refills | Status: DC

## 2019-10-16 MED ORDER — QUETIAPINE FUMARATE 100 MG PO TABS: 100.0000 mg | ORAL_TABLET | Freq: Every day | ORAL | 1 refills | Status: DC

## 2019-10-16 NOTE — Progress Notes (Signed)
Established patient visit   Patient: Katelyn Cooper   DOB: Mar 04, 1956   64 y.o. Female  MRN: [id number] Visit Date: 10/16/2019  Today's healthcare provider: Lelon Huh, MD   Chief Complaint  Patient presents with  . Diabetes  . Hyperlipidemia  . Hypertension   [employer] as a scribe for Lelon Huh, MD.,have documented all relevant documentation on the behalf of Lelon Huh, MD,as directed by  Lelon Huh, MD while in the presence of Lelon Huh, MD.  Subjective    HPI  Diabetes Mellitus Type II, follow-up  Lab Results  Component Value Date   HGBA1C 7.2 (A) 04/17/2019   HGBA1C 7.0 (A) 09/23/2018   HGBA1C 7.2 (A) 05/19/2018   Last seen for diabetes 6 months ago.  Management since then includes continuing the same treatment. She reports good compliance with treatment. She is not having side effects.   Home blood sugar records: fasting range: not being checked  Episodes of hypoglycemia? No    Current insulin regiment: None Most Recent Eye Exam: not UTD  --------------------------------------------------------------------------------------------------- Hypertension, follow-up  BP Readings from Last 3 Encounters:  10/16/19 106/64  09/30/19 114/62  09/02/19 102/62   Wt Readings from Last 3 Encounters:  10/16/19 153 lb 9.6 oz (69.7 kg)  09/30/19 151 lb 4 oz (68.6 kg)  09/02/19 150 lb (68 kg)     She was last seen for hypertension 6 months ago.  BP at that visit was 118/69. Management since that visit includes no change. She reports good compliance with treatment. She is not having side effects.  She is exercising. She is adherent to low salt diet.   Outside blood pressures are being checked periodically.  She does not smoke.  Use of agents associated with hypertension: none.   ---------------------------------------------------------------------------------------------------  She also reports sores angles of mouth for  several weeks. Her dentist told her she might have a vitamin deficiency.     Medications: Outpatient Medications Prior to Visit  Medication Sig  . acetaminophen (TYLENOL) 500 MG tablet Take 500 mg by mouth every 6 (six) hours as needed.  [name] aspirin EC 81 MG tablet Take 1 tablet (81 mg total) by mouth daily.  [name] atorvastatin (LIPITOR) 80 MG tablet Take 1 tablet (80 mg total) by mouth daily.  . Cholecalciferol (VITAMIN D3) 5000 units TABS Take 1 tablet by mouth daily.   . Ferrous Sulfate (IRON PO) Take by mouth. Taking 1 capsule daily  . furosemide (LASIX) 20 MG tablet Take 1 tablet (20 mg total) by mouth daily.  [name] glucose blood test strip ONETOUCH ULTRA MINI STRIPS AND LANCETS. Use one daily to check blood sugar.  . lansoprazole (PREVACID) 15 MG capsule Take 15 mg by mouth daily at 6 (six) AM.  . metFORMIN (GLUCOPHAGE-XR) 500 MG 24 hr tablet Take 1 tablet (500 mg total) by mouth every evening.  . Multiple Vitamins-Minerals (MULTI-VITAMIN GUMMIES PO) Take by mouth. Taking 1 daily  . potassium chloride SA (KLOR-CON) 20 MEQ tablet Take 2 tablets (40 mEq total) by mouth 2 (two) times daily. Needs appointment for further refills. Thank you!  [name] QUEtiapine (SEROQUEL) 100 MG tablet Take 1 tablet (100 mg total) by mouth at bedtime.  [name] venlafaxine XR (EFFEXOR-XR) 75 MG 24 hr capsule Take 1 capsule (75 mg total) by mouth daily with breakfast.  . zaleplon (SONATA) 10 MG capsule Take 1 capsule (10 mg total) by mouth at bedtime as needed for sleep.  . metoprolol tartrate (LOPRESSOR) 25 MG  tablet Take 1 tablet (25 mg total) by mouth 2 (two) times daily.   No facility-administered medications prior to visit.    Review of Systems  Constitutional: Negative.   Respiratory: Negative.   Cardiovascular: Negative.   Endocrine: Negative.   Musculoskeletal: Negative.      Objective    BP 106/64 (BP Location: Right Arm, Patient Position: Sitting, Cuff Size: Normal)   Pulse 76   Temp (!) 96.9 F (36.1 C)  (Temporal)   Ht 5\' 3"  (1.6 m)   Wt 153 lb 9.6 oz (69.7 kg)   BMI 27.21 kg/m    Physical Exam   General appearance:  Overweight female, cooperative and in no acute distress Head: Normocephalic, without obvious abnormality, atraumatic Respiratory: Respirations even and unlabored, normal respiratory rate Oral: Mild angular cheilitis   No results found for any visits on 10/16/19.  Assessment & Plan     1. Controlled type 2 diabetes mellitus without complication, without long-term current use of insulin ([place]) Doing very well with metformin. Continue current medications.  Recheck a1c 6 months.   2. Essential hypertension Very well controlled.   3. Angular cheilitis Recommend B-complex vitamins and protect at night with OTC antibiotic ointment.   She is kept up to date with CPE/pap/breast exam/mammogram and BMD with her gyn.   She goes to [facility] for diabetic eye exam.     Future Appointments  Date Time Provider [place]  10/16/2019 11:00 AM Nevada Crane, MD [facility] None  11/05/2019 11:45 AM Ralene Bathe, MD [facility] None  03/16/2020  3:40 PM End, Harrell Gave, MD [facility]  04/18/2020  8:20 AM Caryn Section Kirstie Peri, MD [facility]         The entirety of the information documented in the History of Present Illness, Review of Systems and Physical Exam were personally obtained by me. Portions of this information were initially documented by the CMA and reviewed by me for thoroughness and accuracy.      Lelon Huh, MD  [facility] [phone] (phone) [phone] (fax)  [place]

## 2019-10-16 NOTE — Progress Notes (Signed)
[place] MD OP Progress Note  Virtual Visit via Video Note  I connected with [name] on 10/16/19 at 11:00 AM EDT by a video enabled telemedicine application and verified that I am speaking with the correct person using two identifiers.  Location: Patient: Home Provider: Clinic   I discussed the limitations of evaluation and management by telemedicine and the availability of in person appointments. The patient expressed understanding and agreed to proceed.  I provided 13 minutes of non-face-to-face time during this encounter.     10/16/2019 11:21 AM Katelyn Cooper  MRN:  [id number]  Chief Complaint:  " Everything is quiet for now."  HPI: Patient informed of things going well.  She informed that she and her husband mostly stayed home and do not go out much.  She stated that she likes to go to the mall with a friend 3 times a week to walk which is a distance of about 2 miles.  She stated that other than that there is nothing exciting going on and things are quiet.  She denies any acute issues or concerns at this time.   Visit Diagnosis:    ICD-10-CM   1. Bipolar disorder in remission (HCC)  F31.70 zaleplon (SONATA) 10 MG capsule    QUEtiapine (SEROQUEL) 100 MG tablet    venlafaxine XR (EFFEXOR-XR) 75 MG 24 hr capsule  2. Post traumatic stress disorder (PTSD)  F43.10 venlafaxine XR (EFFEXOR-XR) 75 MG 24 hr capsule    Past Psychiatric History: PTSD, Bipolar d/o  Past Medical History:  Past Medical History:  Diagnosis Date  . (HFpEF) heart failure with preserved ejection fraction ([place])    a. 12/2015 Echo: EF 55-60%, nl RV size/fxn, no significant valvular abnormalities; b. 10/2017 Echo: EF 50-55%, antsept, ant HK. Gr2 DD. Mild MR. Nl RV fxn. PASP 66mmHg.  [name] Anxiety   . CHF (congestive heart failure) ([place])   . Depression   . Diabetes type 2, controlled ([place])    diet-controlled  . Esophageal stricture   . GERD (gastroesophageal reflux disease)   . Hiatal hernia   . History of  chicken pox   . Hyperlipidemia   . Hypertension   . Internal hemorrhoids   . Non-obstructive CAD (coronary artery disease)    a. 12/2015 MV: apical ant and apical reversible defect. EF 55-65%; b. 01/2016 Cath: LM nl, LAD nl, SP1 40ost, LCX nl, OM1 30, RCA 30p/m, EDP 15-9mmHg; c. 10/2017 Cath: LM nl, LAD nl, SP1 30ost, LCX nl, OM1 30, RCA 30p/m. EF 55%.  . Obstructive sleep apnea   . PAD (peripheral artery disease) ([place])    a. 1990's s/p prior LCIA stenting; b. 12/2015 ABI: R 1.05, L 1.03.  . Panic disorder     Past Surgical History:  Procedure Laterality Date  . [place]   stent placement; ? left iliac artery intervention  . APPENDECTOMY  1998  . BASAL CELL CARCINOMA EXCISION    . CARDIAC CATHETERIZATION N/A 01/12/2016   Procedure: Left Heart Cath and Coronary Angiography;  Surgeon: Nelva Bush, MD;  Location: [place] [facility];  Service: Cardiovascular;  Laterality: N/A;  . COLONOSCOPY  07/17/2019  . FOOT SURGERY     Right  . pap smear  03/2010   Done by [name], normal  . REFRACTIVE SURGERY     right  . RIB RESECTION  [id number]  . RIGHT/LEFT HEART CATH AND CORONARY ANGIOGRAPHY N/A 11/08/2017   Procedure: RIGHT/LEFT HEART CATH AND CORONARY ANGIOGRAPHY;  Surgeon: Wellington Hampshire, MD;  Location: [place] [facility];  Service: Cardiovascular;  Laterality: N/A;  . SHOULDER SURGERY  2005   left  . UPPER GASTROINTESTINAL ENDOSCOPY  07/17/2019  . UPPER GI ENDOSCOPY  09/19/2011   Stricture at GE junction, dilated. Mild gastritis and duodenitis. Small hiatal hernia    Family Psychiatric History: sister- bipolar d/o,mom - depression  Family History:  Family History  Problem Relation Age of Onset  . Lung cancer Mother        was a smoker  . Emphysema Mother   . Cancer Mother        Lung  . Alcohol abuse Mother   . Depression Mother   . Heart disease Mother   . Drug abuse Sister   . Breast cancer Sister        x 2  (30&50)  . Emphysema Sister   .  Bipolar disorder Sister   . Alcohol abuse Other   . Depression Other   . Bipolar disorder Other   . Heart disease Other   . Vision loss Other   . Alcohol abuse Father   . Mood Disorder Father   . Heart disease Maternal Grandmother   . Stroke Maternal Grandmother   . Esophageal cancer Maternal Aunt   . Colon cancer Neg Hx   . Colon polyps Neg Hx   . Rectal cancer Neg Hx   . Stomach cancer Neg Hx     Social History:  Social History   Socioeconomic History  . Marital status: Married    Spouse name: mark  . Number of children: 0  . Years of education: Not on file  . Highest education level: High school graduate  Occupational History  . Occupation: computer-retired    Employer: [employer]  Tobacco Use  . Smoking status: Former Smoker    Packs/day: 1.00    Years: 40.00    Pack years: 40.00    Types: Cigarettes    Start date: 03/01/1975    Quit date: 08/11/2017    Years since quitting: 2.1  . Smokeless tobacco: Never Used  Vaping Use  . Vaping Use: Former  Substance and Sexual Activity  . Alcohol use: No    Alcohol/week: 0.0 standard drinks  . Drug use: No  . Sexual activity: Not Currently  Other Topics Concern  . Not on file  Social History Narrative  . Not on file   Social Determinants of Health   Financial Resource Strain:   . Difficulty of Paying Living Expenses:   Food Insecurity:   . Worried About Charity fundraiser in the Last Year:   . Arboriculturist in the Last Year:   Transportation Needs:   . Film/video editor (Medical):   [name] Lack of Transportation (Non-Medical):   Physical Activity:   . Days of Exercise per Week:   . Minutes of Exercise per Session:   Stress:   . Feeling of Stress :   Social Connections:   . Frequency of Communication with Friends and Family:   . Frequency of Social Gatherings with Friends and Family:   . Attends Religious Services:   . Active Member of Clubs or Organizations:   . Attends Archivist Meetings:    [name] Marital Status:     Allergies:  Allergies  Allergen Reactions  . Lovastatin     depression  . Paroxetine Hcl     itching  . Lamotrigine Rash    Metabolic  Disorder Labs: Lab Results  Component Value Date   HGBA1C 6.7 (A) 10/16/2019   No results found for: PROLACTIN Lab Results  Component Value Date   CHOL 160 04/17/2019   TRIG 169 (H) 04/17/2019   HDL 55 04/17/2019   CHOLHDL 2.9 04/17/2019   LDLCALC 76 04/17/2019   LDLCALC 59 03/21/2018   Lab Results  Component Value Date   TSH 1.550 04/17/2019   TSH 1.26 12/10/2013    Therapeutic Level Labs: No results found for: LITHIUM No results found for: VALPROATE No components found for:  CBMZ  Current Medications: Current Outpatient Medications  Medication Sig Dispense Refill  . acetaminophen (TYLENOL) 500 MG tablet Take 500 mg by mouth every 6 (six) hours as needed.    [name] aspirin EC 81 MG tablet Take 1 tablet (81 mg total) by mouth daily.    [name] atorvastatin (LIPITOR) 80 MG tablet Take 1 tablet (80 mg total) by mouth daily. 90 tablet 1  . Cholecalciferol (VITAMIN D3) 5000 units TABS Take 1 tablet by mouth daily.   0  . Ferrous Sulfate (IRON PO) Take by mouth. Taking 1 capsule daily    . furosemide (LASIX) 20 MG tablet Take 1 tablet (20 mg total) by mouth daily. 90 tablet 0  . glucose blood test strip ONETOUCH ULTRA MINI STRIPS AND LANCETS. Use one daily to check blood sugar. 100 each 3  . lansoprazole (PREVACID) 15 MG capsule Take 15 mg by mouth daily at 6 (six) AM.    . metFORMIN (GLUCOPHAGE-XR) 500 MG 24 hr tablet Take 1 tablet (500 mg total) by mouth every evening. 90 tablet 1  . metoprolol tartrate (LOPRESSOR) 25 MG tablet Take 1 tablet (25 mg total) by mouth 2 (two) times daily. 180 tablet 3  . Multiple Vitamins-Minerals (MULTI-VITAMIN GUMMIES PO) Take by mouth. Taking 1 daily    . potassium chloride SA (KLOR-CON) 20 MEQ tablet Take 2 tablets (40 mEq total) by mouth 2 (two) times daily. Needs appointment for further  refills. Thank you! 360 tablet 0  . QUEtiapine (SEROQUEL) 100 MG tablet Take 1 tablet (100 mg total) by mouth at bedtime. 90 tablet 1  . venlafaxine XR (EFFEXOR-XR) 75 MG 24 hr capsule Take 1 capsule (75 mg total) by mouth daily with breakfast. 90 capsule 1  . zaleplon (SONATA) 10 MG capsule Take 1 capsule (10 mg total) by mouth at bedtime as needed for sleep. 30 capsule 2   No current facility-administered medications for this visit.     Musculoskeletal: Strength & Muscle Tone: unable to assess due to telemed visit Gait & Station: unable to assess due to telemed visit Patient leans: unable to assess due to telemed visit   Psychiatric Specialty Exam: ROS  There were no vitals taken for this visit.There is no height or weight on file to calculate BMI.  General Appearance: Fairly Groomed  Eye Contact:  Good  Speech:  Clear and Coherent and Normal Rate  Volume:  Normal  Mood:  Euthymic  Affect:  Congruent  Thought Process:  Goal Directed, Linear and Descriptions of Associations: Intact  Orientation:  Full (Time, Place, and Person)  Thought Content: Logical   Suicidal Thoughts:  No  Homicidal Thoughts:  No  Memory:  Recent;   Good Remote;   Good  Judgement:  Good  Insight:  Good  Psychomotor Activity:  Normal  Concentration:  Concentration: Good and Attention Span: Good  Recall:  Good  Fund of Knowledge: Good  Language: Good  Akathisia:  Negative  Handed:  Right  AIMS (if indicated): not tested  Assets:  Communication Skills Desire for Improvement Financial Resources/Insurance Housing Leisure Time Social Support Talents/Skills Transportation  ADL's:  Intact  Cognition: WNL  Sleep:  Good with help of Sonata   Screenings: AIMS     Office Visit from 03/10/2018 in [facility] Total Score 0    PHQ2-9     Office Visit from 12/31/2017 in [place] Office Visit from 11/15/2017 in [place] Office Visit from 09/20/2016 in [place]  PHQ-2 Total Score 0 0 0  PHQ-9 Total Score -- -- 3       Assessment and Plan: 64 y/o female with hx of CHF, COPD and iron deficiency anemia, Bipolar d/o and PTSD seen for f/up, now stable on her med regimen.  1. Bipolar disorder in remission (HCC)  - venlafaxine XR (EFFEXOR-XR) 75 MG 24 hr capsule; Take 1 capsule (75 mg total) by mouth daily with breakfast.  Dispense: 90 capsule; Refill: 1 - QUEtiapine (SEROQUEL) 100 MG tablet; Take 1 tablet (100 mg total) by mouth at bedtime.  Dispense: 90 tablet; Refill: 1 - Continue Sonata 10 mg HS  2. Post traumatic stress disorder (PTSD)  - venlafaxine XR (EFFEXOR-XR) 75 MG 24 hr capsule; Take 1 capsule (75 mg total) by mouth daily with breakfast.  Dispense: 90 capsule; Refill: 1  Continue same regimen. F/up in 3 months.  [place] Crane, MD 10/16/2019, 11:21 AM

## 2019-10-27 ENCOUNTER — Telehealth: Payer: Self-pay | Admitting: Internal Medicine

## 2019-10-27 DIAGNOSIS — G4733 Obstructive sleep apnea (adult) (pediatric): Secondary | ICD-10-CM

## 2019-10-27 NOTE — Telephone Encounter (Signed)
Order sent to [facility] for [employer] to provide CPAP supplies

## 2019-10-27 NOTE — Telephone Encounter (Signed)
Spoke with the pt and notified that this was done. Nothing further needed.

## 2019-10-31 ENCOUNTER — Other Ambulatory Visit: Payer: Self-pay | Admitting: Physician Assistant

## 2019-11-05 ENCOUNTER — Ambulatory Visit (INDEPENDENT_AMBULATORY_CARE_PROVIDER_SITE_OTHER): Payer: BC Managed Care – PPO | Admitting: Dermatology

## 2019-11-05 ENCOUNTER — Other Ambulatory Visit: Payer: Self-pay

## 2019-11-05 ENCOUNTER — Encounter: Payer: Self-pay | Admitting: Dermatology

## 2019-11-05 DIAGNOSIS — L820 Inflamed seborrheic keratosis: Secondary | ICD-10-CM

## 2019-11-05 DIAGNOSIS — L821 Other seborrheic keratosis: Secondary | ICD-10-CM

## 2019-11-05 DIAGNOSIS — L720 Epidermal cyst: Secondary | ICD-10-CM | POA: Diagnosis not present

## 2019-11-05 DIAGNOSIS — L578 Other skin changes due to chronic exposure to nonionizing radiation: Secondary | ICD-10-CM | POA: Diagnosis not present

## 2019-11-05 DIAGNOSIS — Z85828 Personal history of other malignant neoplasm of skin: Secondary | ICD-10-CM | POA: Diagnosis not present

## 2019-11-05 NOTE — Progress Notes (Signed)
   Follow-Up Visit   Subjective  Katelyn Cooper is a 64 y.o. female who presents for the following: Lesions (on the face and B/L leg - patient is concerned and would like them treated).  The following portions of the chart were reviewed this encounter and updated as appropriate:  Tobacco  Allergies  Meds  Problems  Med Hx  Surg Hx  Fam Hx     Review of Systems:  No other skin or systemic complaints except as noted in HPI or Assessment and Plan.  Objective  Well appearing patient in no apparent distress; mood and affect are within normal limits.  A focused examination was performed including face, arms, legs. Relevant physical exam findings are noted in the Assessment and Plan.  Objective  Forehead: Smooth white papule(s).   Objective  Face and legs x 10 (10): Erythematous keratotic or waxy stuck-on papule or plaque.    Assessment & Plan  [place] Forehead  Benign, observe.    Inflamed seborrheic keratosis (10) Face and legs x 10  Destruction of lesion - Face and legs x 10 Complexity: simple   Destruction method: cryotherapy   Informed consent: discussed and consent obtained   Timeout:  patient name, date of birth, surgical site, and procedure verified Lesion destroyed using liquid nitrogen: Yes   Region frozen until ice ball extended beyond lesion: Yes   Outcome: patient tolerated procedure well with no complications   Post-procedure details: wound care instructions given     Seborrheic Keratoses - Stuck-on, waxy, tan-brown papules and plaques  - Discussed benign etiology and prognosis. - Observe - Call for any changes  Actinic Damage - diffuse scaly erythematous macules with underlying dyspigmentation - Recommend daily broad spectrum sunscreen SPF 30+ to sun-exposed areas, reapply every 2 hours as needed.  - Call for new or changing lesions.  History of Basal Cell Carcinoma of the Skin - No evidence of recurrence today - Recommend regular full body skin  exams - Recommend daily broad spectrum sunscreen SPF 30+ to sun-exposed areas, reapply every 2 hours as needed.  - Call if any new or changing lesions are noted between office visits   Return in about 1 year (around 11/04/2020) for TBSE.  Luther Redo, CMA, am acting as scribe for Sarina Ser, MD .  Documentation: I have reviewed the above documentation for accuracy and completeness, and I agree with the above.  Sarina Ser, MD

## 2019-11-07 ENCOUNTER — Encounter: Payer: Self-pay | Admitting: Dermatology

## 2019-11-18 ENCOUNTER — Other Ambulatory Visit: Payer: Self-pay | Admitting: Family Medicine

## 2019-11-18 DIAGNOSIS — E119 Type 2 diabetes mellitus without complications: Secondary | ICD-10-CM

## 2019-11-18 NOTE — Telephone Encounter (Signed)
Requested Prescriptions  Pending Prescriptions Disp Refills  . metFORMIN (GLUCOPHAGE-XR) 500 MG 24 hr tablet [Pharmacy Med Name: METFORMIN HCL ER TABS 500MG] 90 tablet 1    Sig: TAKE 1 TABLET BY MOUTH EVERY EVENING     Endocrinology:  Diabetes - Biguanides Passed - 11/18/2019 12:26 AM      Passed - Cr in normal range and within 360 days    Creatinine, Ser  Date Value Ref Range Status  06/26/2019 0.82 0.57 - 1.00 mg/dL Final         Passed - HBA1C is between 0 and 7.9 and within 180 days    Hemoglobin A1C  Date Value Ref Range Status  10/16/2019 6.7 (A) 4.0 - 5.6 % Final   Hgb A1c MFr Bld  Date Value Ref Range Status  09/21/2016 6.5 (H) 4.8 - 5.6 % Final    Comment:             Pre-diabetes: 5.7 - 6.4          Diabetes: >6.4          Glycemic control for adults with diabetes: <7.0          Passed - eGFR in normal range and within 360 days    GFR calc Af Amer  Date Value Ref Range Status  06/26/2019 87 >59 mL/min/1.73 Final   GFR calc non Af Amer  Date Value Ref Range Status  06/26/2019 75 >59 mL/min/1.73 Final         Passed - Valid encounter within last 6 months    Recent Outpatient Visits          1 month ago Controlled type 2 diabetes mellitus without complication, without long-term current use of insulin [facility])   [facility] [name], MD   6 months ago Cough   [place], [place], PA-C   6 months ago Upper respiratory tract infection, unspecified type   [facility] [name], MD   7 months ago Controlled type 2 diabetes mellitus without complication, without long-term current use of insulin [facility])   [facility] [name], MD   1 year ago Acute right-sided low back pain without sciatica   [place], Clearnce Sorrel, [place]      Future Appointments            In 3 months End, Harrell Gave, MD [facility], [facility]   In 5 months Fisher,  Kirstie Peri, MD [facility], [place]

## 2019-11-23 ENCOUNTER — Other Ambulatory Visit: Payer: Self-pay | Admitting: Internal Medicine

## 2019-12-16 DIAGNOSIS — G4733 Obstructive sleep apnea (adult) (pediatric): Secondary | ICD-10-CM | POA: Diagnosis not present

## 2019-12-17 ENCOUNTER — Encounter: Payer: Self-pay | Admitting: Family Medicine

## 2019-12-17 LAB — HM DIABETES EYE EXAM

## 2020-01-05 DIAGNOSIS — Z23 Encounter for immunization: Secondary | ICD-10-CM | POA: Diagnosis not present

## 2020-01-08 ENCOUNTER — Telehealth (INDEPENDENT_AMBULATORY_CARE_PROVIDER_SITE_OTHER): Payer: BC Managed Care – PPO | Admitting: Psychiatry

## 2020-01-08 ENCOUNTER — Encounter (HOSPITAL_COMMUNITY): Payer: Self-pay | Admitting: Psychiatry

## 2020-01-08 ENCOUNTER — Other Ambulatory Visit: Payer: Self-pay

## 2020-01-08 DIAGNOSIS — F4310 Post-traumatic stress disorder, unspecified: Secondary | ICD-10-CM

## 2020-01-08 DIAGNOSIS — F3170 Bipolar disorder, currently in remission, most recent episode unspecified: Secondary | ICD-10-CM | POA: Diagnosis not present

## 2020-01-08 MED ORDER — ZALEPLON 10 MG PO CAPS: 10.0000 mg | ORAL_CAPSULE | Freq: Every evening | ORAL | 2 refills | Status: DC | PRN

## 2020-01-08 MED ORDER — VENLAFAXINE HCL ER 75 MG PO CP24: 75.0000 mg | ORAL_CAPSULE | Freq: Every day | ORAL | 1 refills | Status: DC

## 2020-01-08 MED ORDER — QUETIAPINE FUMARATE 100 MG PO TABS: 100.0000 mg | ORAL_TABLET | Freq: Every day | ORAL | 1 refills | Status: DC

## 2020-01-08 NOTE — Progress Notes (Signed)
[facility] MD OP Progress Note  Virtual Visit via Video Note  I connected with Katelyn Cooper on 01/08/20 at 11:20 AM EDT by a video enabled telemedicine application and verified that I am speaking with the correct person using two identifiers.  Location: Patient: Home Provider: Clinic   I discussed the limitations of evaluation and management by telemedicine and the availability of in person appointments. The patient expressed understanding and agreed to proceed.  I provided 14 minutes of non-face-to-face time during this encounter.     01/08/2020 11:14 AM Katelyn Cooper  MRN:  [id number]  Chief Complaint:  " I am doing well."  HPI: Patient reported that she is doing well.  She informed that she recently got her COVID-19 booster dose as well as a flu shot.  She is feeling pretty good about all this.  She stated that she and her husband do not go out much and to stay around the house.  She stated that there is nothing exciting going on but overall things are great.  She denied any concerns about her medications.  Visit Diagnosis:    ICD-10-CM   1. Bipolar disorder in remission ([place])  F31.70   2. Post traumatic stress disorder (PTSD)  F43.10     Past Psychiatric History: PTSD, Bipolar d/o  Past Medical History:  Past Medical History:  Diagnosis Date  . (HFpEF) heart failure with preserved ejection fraction ([place])    a. 12/2015 Echo: EF 55-60%, nl RV size/fxn, no significant valvular abnormalities; b. 10/2017 Echo: EF 50-55%, antsept, ant HK. Gr2 DD. Mild MR. Nl RV fxn. PASP 84mmHg.  [name] Anxiety   . Basal cell carcinoma 08/08/2017   L nose above mid nasal alar groove  . CHF (congestive heart failure) ([place])   . Depression   . Diabetes type 2, controlled ([place])    diet-controlled  . Esophageal stricture   . GERD (gastroesophageal reflux disease)   . Hiatal hernia   . History of chicken pox   . Hyperlipidemia   . Hypertension   . Internal hemorrhoids   . Non-obstructive CAD (coronary  artery disease)    a. 12/2015 MV: apical ant and apical reversible defect. EF 55-65%; b. 01/2016 Cath: LM nl, LAD nl, SP1 40ost, LCX nl, OM1 30, RCA 30p/m, EDP 15-22mmHg; c. 10/2017 Cath: LM nl, LAD nl, SP1 30ost, LCX nl, OM1 30, RCA 30p/m. EF 55%.  . Obstructive sleep apnea   . PAD (peripheral artery disease) ([place])    a. 1990's s/p prior LCIA stenting; b. 12/2015 ABI: R 1.05, L 1.03.  . Panic disorder     Past Surgical History:  Procedure Laterality Date  . [place]   stent placement; ? left iliac artery intervention  . APPENDECTOMY  1998  . BASAL CELL CARCINOMA EXCISION    . CARDIAC CATHETERIZATION N/A 01/12/2016   Procedure: Left Heart Cath and Coronary Angiography;  Surgeon: Nelva Bush, MD;  Location: [facility];  Service: Cardiovascular;  Laterality: N/A;  . COLONOSCOPY  07/17/2019  . FOOT SURGERY     Right  . pap smear  03/2010   Done by [name], normal  . REFRACTIVE SURGERY     right  . RIB RESECTION  [id number]  . RIGHT/LEFT HEART CATH AND CORONARY ANGIOGRAPHY N/A 11/08/2017   Procedure: RIGHT/LEFT HEART CATH AND CORONARY ANGIOGRAPHY;  Surgeon: Wellington Hampshire, MD;  Location: [facility];  Service: Cardiovascular;  Laterality: N/A;  . SHOULDER SURGERY  2005   left  . UPPER GASTROINTESTINAL ENDOSCOPY  07/17/2019  . UPPER GI ENDOSCOPY  09/19/2011   Stricture at GE junction, dilated. Mild gastritis and duodenitis. Small hiatal hernia    Family Psychiatric History: sister- bipolar d/o,mom - depression  Family History:  Family History  Problem Relation Age of Onset  . Lung cancer Mother        was a smoker  . Emphysema Mother   . Cancer Mother        Lung  . Alcohol abuse Mother   . Depression Mother   . Heart disease Mother   . Drug abuse Sister   . Breast cancer Sister        x 2  (30&50)  . Emphysema Sister   . Bipolar disorder Sister   . Alcohol abuse Other   . Depression Other   . Bipolar disorder Other   . Heart  disease Other   . Vision loss Other   . Alcohol abuse Father   . Mood Disorder Father   . Heart disease Maternal Grandmother   . Stroke Maternal Grandmother   . Esophageal cancer Maternal Aunt   . Colon cancer Neg Hx   . Colon polyps Neg Hx   . Rectal cancer Neg Hx   . Stomach cancer Neg Hx     Social History:  Social History   Socioeconomic History  . Marital status: Married    Spouse name: mark  . Number of children: 0  . Years of education: Not on file  . Highest education level: High school graduate  Occupational History  . Occupation: computer-retired    Employer: [employer]  Tobacco Use  . Smoking status: Former Smoker    Packs/day: 1.00    Years: 40.00    Pack years: 40.00    Types: Cigarettes    Start date: 03/01/1975    Quit date: 08/11/2017    Years since quitting: 2.4  . Smokeless tobacco: Never Used  Vaping Use  . Vaping Use: Former  Substance and Sexual Activity  . Alcohol use: No    Alcohol/week: 0.0 standard drinks  . Drug use: No  . Sexual activity: Not Currently  Other Topics Concern  . Not on file  Social History Narrative  . Not on file   Social Determinants of Health   Financial Resource Strain:   . Difficulty of Paying Living Expenses: Not on file  Food Insecurity:   . Worried About Charity fundraiser in the Last Year: Not on file  . Ran Out of Food in the Last Year: Not on file  Transportation Needs:   . Lack of Transportation (Medical): Not on file  . Lack of Transportation (Non-Medical): Not on file  Physical Activity:   . Days of Exercise per Week: Not on file  . Minutes of Exercise per Session: Not on file  Stress:   . Feeling of Stress : Not on file  Social Connections:   . Frequency of Communication with Friends and Family: Not on file  . Frequency of Social Gatherings with Friends and Family: Not on file  . Attends Religious Services: Not on file  . Active Member of Clubs or Organizations: Not on file  . Attends Theatre manager Meetings: Not on file  . Marital Status: Not on file    Allergies:  Allergies  Allergen Reactions  . Lovastatin     depression  . Paroxetine Hcl     itching  .  Lamotrigine Rash    Metabolic Disorder Labs: Lab Results  Component Value Date   HGBA1C 6.7 (A) 10/16/2019   No results found for: PROLACTIN Lab Results  Component Value Date   CHOL 160 04/17/2019   TRIG 169 (H) 04/17/2019   HDL 55 04/17/2019   CHOLHDL 2.9 04/17/2019   LDLCALC 76 04/17/2019   LDLCALC 59 03/21/2018   Lab Results  Component Value Date   TSH 1.550 04/17/2019   TSH 1.26 12/10/2013    Therapeutic Level Labs: No results found for: LITHIUM No results found for: VALPROATE No components found for:  CBMZ  Current Medications: Current Outpatient Medications  Medication Sig Dispense Refill  . acetaminophen (TYLENOL) 500 MG tablet Take 500 mg by mouth every 6 (six) hours as needed.    [name] aspirin EC 81 MG tablet Take 1 tablet (81 mg total) by mouth daily.    [name] atorvastatin (LIPITOR) 80 MG tablet Take 1 tablet (80 mg total) by mouth daily. 90 tablet 1  . Cholecalciferol (VITAMIN D3) 5000 units TABS Take 1 tablet by mouth daily.   0  . Ferrous Sulfate (IRON PO) Take by mouth. Taking 1 capsule daily    . furosemide (LASIX) 20 MG tablet TAKE 1 TABLET DAILY 90 tablet 3  . glucose blood test strip ONETOUCH ULTRA MINI STRIPS AND LANCETS. Use one daily to check blood sugar. 100 each 3  . KLOR-CON M20 20 MEQ tablet TAKE 2 TABLETS TWICE A DAY (NEEDS APPOINTMENT FOR FURTHER REFILLS) 360 tablet 0  . lansoprazole (PREVACID) 15 MG capsule Take 15 mg by mouth daily at 6 (six) AM.    . metFORMIN (GLUCOPHAGE-XR) 500 MG 24 hr tablet TAKE 1 TABLET BY MOUTH EVERY EVENING 90 tablet 1  . metoprolol tartrate (LOPRESSOR) 25 MG tablet Take 1 tablet (25 mg total) by mouth 2 (two) times daily. 180 tablet 3  . Multiple Vitamins-Minerals (MULTI-VITAMIN GUMMIES PO) Take by mouth. Taking 1 daily    . QUEtiapine  (SEROQUEL) 100 MG tablet Take 1 tablet (100 mg total) by mouth at bedtime. 90 tablet 1  . venlafaxine XR (EFFEXOR-XR) 75 MG 24 hr capsule Take 1 capsule (75 mg total) by mouth daily with breakfast. 90 capsule 1  . zaleplon (SONATA) 10 MG capsule Take 1 capsule (10 mg total) by mouth at bedtime as needed for sleep. 30 capsule 2   No current facility-administered medications for this visit.     Musculoskeletal: Strength & Muscle Tone: unable to assess due to telemed visit Gait & Station: unable to assess due to telemed visit Patient leans: unable to assess due to telemed visit   Psychiatric Specialty Exam: ROS  There were no vitals taken for this visit.There is no height or weight on file to calculate BMI.  General Appearance: Fairly Groomed  Eye Contact:  Good  Speech:  Clear and Coherent and Normal Rate  Volume:  Normal  Mood:  Euthymic  Affect:  Congruent  Thought Process:  Goal Directed, Linear and Descriptions of Associations: Intact  Orientation:  Full (Time, Place, and Person)  Thought Content: Logical   Suicidal Thoughts:  No  Homicidal Thoughts:  No  Memory:  Recent;   Good Remote;   Good  Judgement:  Good  Insight:  Good  Psychomotor Activity:  Normal  Concentration:  Concentration: Good and Attention Span: Good  Recall:  Good  Fund of Knowledge: Good  Language: Good  Akathisia:  Negative  Handed:  Right  AIMS (if indicated): not tested  Assets:  Communication Skills Desire for Improvement Financial Resources/Insurance Housing Leisure Time Social Support Talents/Skills Transportation  ADL's:  Intact  Cognition: WNL  Sleep:  Good with help of Sonata   Screenings: AIMS     Office Visit from 03/10/2018 in [facility] Total Score 0    PHQ2-9     Office Visit from 12/31/2017 in [facility] Office Visit from 11/15/2017 in [facility] Office  Visit from 09/20/2016 in [facility]  PHQ-2 Total Score 0 0 0  PHQ-9 Total Score -- -- 3       Assessment and Plan: 64 y/o female with hx of CHF, COPD and iron deficiency anemia, Bipolar d/o and PTSD seen for f/up, doing well on her med regimen.  1. Bipolar disorder in remission (H[facility])  - venlafaxine XR (EFFEXOR-XR) 75 MG 24 hr capsule; Take 1 capsule (75 mg total) by mouth daily with breakfast.  Dispense: 90 capsule; Refill: 1 - QUEtiapine (SEROQUEL) 100 MG tablet; Take 1 tablet (100 mg total) by mouth at bedtime.  Dispense: 90 tablet; Refill: 1 - Continue Sonata 10 mg HS  2. Post traumatic stress disorder (PTSD)  - venlafaxine XR (EFFEXOR-XR) 75 MG 24 hr capsule; Take 1 capsule (75 mg total) by mouth daily with breakfast.  Dispense: 90 capsule; Refill: 1  Continue same regimen. F/up in 3 months.  Nevada Crane, MD 01/08/2020, 11:14 AM

## 2020-01-15 DIAGNOSIS — G4733 Obstructive sleep apnea (adult) (pediatric): Secondary | ICD-10-CM | POA: Diagnosis not present

## 2020-01-27 ENCOUNTER — Ambulatory Visit (INDEPENDENT_AMBULATORY_CARE_PROVIDER_SITE_OTHER): Payer: BC Managed Care – PPO | Admitting: Adult Health

## 2020-01-27 ENCOUNTER — Encounter: Payer: Self-pay | Admitting: Adult Health

## 2020-01-27 ENCOUNTER — Other Ambulatory Visit: Payer: Self-pay

## 2020-01-27 VITALS — BP 110/86 | HR 72 | Temp 98.2°F | Resp 15 | Wt 144.2 lb

## 2020-01-27 DIAGNOSIS — R197 Diarrhea, unspecified: Secondary | ICD-10-CM | POA: Insufficient documentation

## 2020-01-27 MED ORDER — AZITHROMYCIN 250 MG PO TABS: ORAL_TABLET | ORAL | 0 refills | Status: AC

## 2020-01-27 NOTE — Patient Instructions (Addendum)
Do stool culture then start antibiotics.   Food Choices to Help Relieve Diarrhea, Adult When you have diarrhea, the foods you eat and your eating habits are very important. Choosing the right foods and drinks can help:  Relieve diarrhea.  Replace lost fluids and nutrients.  Prevent dehydration. What general guidelines should I follow?  Relieving diarrhea  Choose foods with less than 2 g or .07 oz. of fiber per serving.  Limit fats to less than 8 tsp (38 g or 1.34 oz.) a day.  Avoid the following: ? Foods and beverages sweetened with high-fructose corn syrup, honey, or sugar alcohols such as xylitol, sorbitol, and mannitol. ? Foods that contain a lot of fat or sugar. ? Fried, greasy, or spicy foods. ? High-fiber grains, breads, and cereals. ? Raw fruits and vegetables.  Eat foods that are rich in probiotics. These foods include dairy products such as yogurt and fermented milk products. They help increase healthy bacteria in the stomach and intestines (gastrointestinal tract, or GI tract).  If you have lactose intolerance, avoid dairy products. These may make your diarrhea worse.  Take medicine to help stop diarrhea (antidiarrheal medicine) only as told by your health care provider. Replacing nutrients  Eat small meals or snacks every 3-4 hours.  Eat bland foods, such as white rice, toast, or baked potato, until your diarrhea starts to get better. Gradually reintroduce nutrient-rich foods as tolerated or as told by your health care provider. This includes: ? Well-cooked protein foods. ? Peeled, seeded, and soft-cooked fruits and vegetables. ? Low-fat dairy products.  Take vitamin and mineral supplements as told by your health care provider. Preventing dehydration  Start by sipping water or a special solution to prevent dehydration (oral rehydration solution, ORS). Urine that is clear or pale yellow means that you are getting enough fluid.  Try to drink at least 8-10 cups  of fluid each day to help replace lost fluids.  You may add other liquids in addition to water, such as clear juice or decaffeinated sports drinks, as tolerated or as told by your health care provider.  Avoid drinks with caffeine, such as coffee, tea, or soft drinks.  Avoid alcohol. What foods are recommended?     The items listed may not be a complete list. Talk with your health care provider about what dietary choices are best for you. Grains White rice. White, [place], or pita breads (fresh or toasted), including plain rolls, buns, or bagels. White pasta. Saltine, soda, or graham crackers. Pretzels. Low-fiber cereal. Cooked cereals made with water (such as cornmeal, farina, or cream cereals). Plain muffins. Matzo. Melba toast. [place]back. Vegetables Potatoes (without the skin). Most well-cooked and canned vegetables without skins or seeds. Tender lettuce. Fruits Apple sauce. Fruits canned in juice. Cooked apricots, cherries, grapefruit, peaches, pears, or plums. Fresh bananas and cantaloupe. Meats and other protein foods Baked or boiled chicken. Eggs. Tofu. Fish. Seafood. Smooth nut butters. Ground or well-cooked tender beef, ham, veal, lamb, pork, or poultry. Dairy Plain yogurt, kefir, and unsweetened liquid yogurt. Lactose-free milk, buttermilk, skim milk, or soy milk. Low-fat or nonfat hard cheese. Beverages Water. Low-calorie sports drinks. Fruit juices without pulp. Strained tomato and vegetable juices. Decaffeinated teas. Sugar-free beverages not sweetened with sugar alcohols. Oral rehydration solutions, if approved by your health care provider. Seasoning and other foods Bouillon, broth, or soups made from recommended foods. What foods are not recommended? The items listed may not be a complete list. Talk with your health care provider about  what dietary choices are best for you. Grains Whole grain, whole wheat, bran, or rye breads, rolls, pastas, and crackers. Wild or brown rice.  Whole grain or bran cereals. Barley. Oats and oatmeal. Corn tortillas or taco shells. Granola. Popcorn. Vegetables Raw vegetables. Fried vegetables. Cabbage, broccoli, Brussels sprouts, artichokes, baked beans, beet greens, corn, kale, legumes, peas, sweet potatoes, and yams. Potato skins. Cooked spinach and cabbage. Fruits Dried fruit, including raisins and dates. Raw fruits. Stewed or dried prunes. Canned fruits with syrup. Meat and other protein foods Fried or fatty meats. Deli meats. Chunky nut butters. Nuts and seeds. Beans and lentils. Berniece Salines. Hot dogs. Sausage. Dairy High-fat cheeses. Whole milk, chocolate milk, and beverages made with milk, such as milk shakes. Half-and-half. Cream. sour cream. Ice cream. Beverages Caffeinated beverages (such as coffee, tea, soda, or energy drinks). Alcoholic beverages. Fruit juices with pulp. Prune juice. Soft drinks sweetened with high-fructose corn syrup or sugar alcohols. High-calorie sports drinks. Fats and oils Butter. Cream sauces. Margarine. Salad oils. Plain salad dressings. Olives. Avocados. Mayonnaise. Sweets and desserts Sweet rolls, doughnuts, and sweet breads. Sugar-free desserts sweetened with sugar alcohols such as xylitol and sorbitol. Seasoning and other foods Honey. Hot sauce. Chili powder. Gravy. Cream-based or milk-based soups. Pancakes and waffles. Summary  When you have diarrhea, the foods you eat and your eating habits are very important.  Make sure you get at least 8-10 cups of fluid each day, or enough to keep your urine clear or pale yellow.  Eat bland foods and gradually reintroduce healthy, nutrient-rich foods as tolerated, or as told by your health care provider.  Avoid high-fiber, fried, greasy, or spicy foods. This information is not intended to replace advice given to you by your health care provider. Make sure you discuss any questions you have with your health care provider. Document Revised: 07/10/2018 Document  Reviewed: 03/16/2016 Elsevier Patient Education  [place]. Diarrhea, Adult Diarrhea is when you pass loose and watery poop (stool) often. Diarrhea can make you feel weak and cause you to lose water in your body (get dehydrated). Losing water in your body can cause you to:  Feel tired and thirsty.  Have a dry mouth.  Go pee (urinate) less often. Diarrhea often lasts 2-3 days. However, it can last longer if it is a sign of something more serious. It is important to treat your diarrhea as told by your doctor. Follow these instructions at home: Eating and drinking     Follow these instructions as told by your doctor:  Take an ORS (oral rehydration solution). This is a drink that helps you replace fluids and minerals your body lost. It is sold at pharmacies and stores.  Drink plenty of fluids, such as: ? Water. ? Ice chips. ? Diluted fruit juice. ? Low-calorie sports drinks. ? Milk, if you want.  Avoid drinking fluids that have a lot of sugar or caffeine in them.  Eat bland, easy-to-digest foods in small amounts as you are able. These foods include: ? Bananas. ? Applesauce. ? Rice. ? Low-fat (lean) meats. ? Toast. ? Crackers.  Avoid alcohol.  Avoid spicy or fatty foods.  Medicines  Take over-the-counter and prescription medicines only as told by your doctor.  If you were prescribed an antibiotic medicine, take it as told by your doctor. Do not stop using the antibiotic even if you start to feel better. General instructions   Wash your hands often using soap and water. If soap and water are not available, use  a hand sanitizer. Others in your home should wash their hands as well. Hands should be washed: ? After using the toilet or changing a diaper. ? Before preparing, cooking, or serving food. ? While caring for a sick person. ? While visiting someone in a hospital.  Drink enough fluid to keep your pee (urine) pale yellow.  Rest at home while you get  better.  Watch your condition for any changes.  Take a warm bath to help with any burning or pain from having diarrhea.  Keep all follow-up visits as told by your doctor. This is important. Contact a doctor if:  You have a fever.  Your diarrhea gets worse.  You have new symptoms.  You cannot keep fluids down.  You feel light-headed or dizzy.  You have a headache.  You have muscle cramps. Get help right away if:  You have chest pain.  You feel very weak or you pass out (faint).  You have bloody or black poop or poop that looks like tar.  You have very bad pain, cramping, or bloating in your belly (abdomen).  You have trouble breathing or you are breathing very quickly.  Your heart is beating very quickly.  Your skin feels cold and clammy.  You feel confused.  You have signs of losing too much water in your body, such as: ? Dark pee, very little pee, or no pee. ? Cracked lips. ? Dry mouth. ? Sunken eyes. ? Sleepiness. ? Weakness. Summary  Diarrhea is when you pass loose and watery poop (stool) often.  Diarrhea can make you feel weak and cause you to lose water in your body (get dehydrated).  Take an ORS (oral rehydration solution). This is a drink that is sold at pharmacies and stores.  Eat bland, easy-to-digest foods in small amounts as you are able.  Contact a doctor if your condition gets worse. Get help right away if you have signs that you have lost too much water in your body. This information is not intended to replace advice given to you by your health care provider. Make sure you discuss any questions you have with your health care provider. Document Revised: 08/23/2017 Document Reviewed: 08/23/2017 Elsevier Patient Education  [place].

## 2020-01-27 NOTE — Progress Notes (Signed)
Established patient visit   Patient: Katelyn Cooper   DOB: May 25, 1955   64 y.o. Female  MRN: [id number] Visit Date: 01/27/2020  Today's healthcare provider: Marcille Buffy, FNP   Chief Complaint  Patient presents with  . Diarrhea   Subjective    Diarrhea  This is a new problem. The current episode started 1 to 4 weeks ago. The problem has been unchanged. Diarrhea characteristics: soft liquid. The patient states that diarrhea does not awaken her from sleep. Associated symptoms include abdominal pain (LLQ mild occasional) and increased flatus. Pertinent negatives include no arthralgias, bloating, chills, coughing, fever, headaches, sweats, URI, vomiting or weight loss. Nothing aggravates the symptoms. She has tried bismuth subsalicylate for the symptoms.    Onset 2 weeks ago.  She has lower abdominal cramping that started last week. She is having diarrhea every other day. She describes soft then watery. Denies any blood, dark stools, mucous pus or weird odor.  Denies any nausea.  Denies any ill contacts. Denies having this in past like this. She does have a GI MD.  Patient  denies any fever, body aches,chills, rash, chest pain, shortness of breath, nausea, vomiting. colonoscopy and Upper endoscopy 07/17/19    Denies dizziness, lightheadedness, pre syncopal or syncopal episodes.   Patient Active Problem List   Diagnosis Date Noted  . Diarrhea 01/27/2020  . Angular cheilitis 10/16/2019  . Bipolar disorder in remission ([place]) 02/09/2019  . Post traumatic stress disorder (PTSD) 02/09/2019  . Chronic heart failure with preserved ejection fraction (HFpEF) ([place]) 11/15/2017  . HTN (hypertension) 11/15/2017  . Obstructive sleep apnea 11/15/2017  . NICM (nonischemic cardiomyopathy) ([place]) 11/13/2017  . CAD in native artery 11/13/2017  . Menopausal symptom 06/13/2017  . Osteopenia 06/13/2017  . Uterine leiomyoma 06/13/2017  . PAD (peripheral artery disease) ([place]) 09/19/2016   . Hyperlipidemia LDL goal <70 09/19/2016  . Coronary atherosclerosis 09/05/2016  . Abnormal stress test 01/12/2016  . Pleuritic chest pain 07/28/2015  . Duodenitis 01/05/2015  . Dermatitis, nummular 01/05/2015  . Allergic rhinitis 12/23/2014  . Anxiety 12/23/2014  . Ataxia 12/23/2014  . Back ache 12/23/2014  . Cervical muscle strain 12/23/2014  . Depression 12/23/2014  . Eczema 12/23/2014  . GERD (gastroesophageal reflux disease) 12/23/2014  . History of anemia 12/23/2014  . Pure hypercholesterolemia 12/23/2014  . Episodic mood disorder ([place]) 12/23/2014  . Nephropyelitis 12/23/2014  . Situational stress 12/23/2014  . Tremor 12/23/2014  . Vitamin D deficiency 11/03/2013  . Diabetes mellitus type 2, controlled ([place]) 08/13/2011  . Cough 05/10/2011  . Chest pain 05/10/2011  . Tobacco abuse 05/10/2011   Past Medical History:  Diagnosis Date  . (HFpEF) heart failure with preserved ejection fraction ([place])    a. 12/2015 Echo: EF 55-60%, nl RV size/fxn, no significant valvular abnormalities; b. 10/2017 Echo: EF 50-55%, antsept, ant HK. Gr2 DD. Mild MR. Nl RV fxn. PASP 29mmHg.  [name] Anxiety   . Basal cell carcinoma 08/08/2017   L nose above mid nasal alar groove  . CHF (congestive heart failure) ([place])   . Depression   . Diabetes type 2, controlled ([place])    diet-controlled  . Esophageal stricture   . GERD (gastroesophageal reflux disease)   . Hiatal hernia   . History of chicken pox   . Hyperlipidemia   . Hypertension   . Internal hemorrhoids   . Non-obstructive CAD (coronary artery disease)    a. 12/2015 MV: apical ant and apical reversible defect. EF 55-65%; b. 01/2016  Cath: LM nl, LAD nl, SP1 40ost, LCX nl, OM1 30, RCA 30p/m, EDP 15-24mmHg; c. 10/2017 Cath: LM nl, LAD nl, SP1 30ost, LCX nl, OM1 30, RCA 30p/m. EF 55%.  . Obstructive sleep apnea   . PAD (peripheral artery disease) ([place])    a. 1990's s/p prior LCIA stenting; b. 12/2015 ABI: R 1.05, L 1.03.  . Panic disorder     Allergies  Allergen Reactions  . Lovastatin     depression  . Paroxetine Hcl     itching  . Lamotrigine Rash       Medications: Outpatient Medications Prior to Visit  Medication Sig  . acetaminophen (TYLENOL) 500 MG tablet Take 500 mg by mouth every 6 (six) hours as needed.  [name] aspirin EC 81 MG tablet Take 1 tablet (81 mg total) by mouth daily.  . Cholecalciferol (VITAMIN D3) 5000 units TABS Take 1 tablet by mouth daily.   . Ferrous Sulfate (IRON PO) Take by mouth. Taking 1 capsule daily  . furosemide (LASIX) 20 MG tablet TAKE 1 TABLET DAILY  . glucose blood test strip ONETOUCH ULTRA MINI STRIPS AND LANCETS. Use one daily to check blood sugar.  . KLOR-CON M20 20 MEQ tablet TAKE 2 TABLETS TWICE A DAY (NEEDS APPOINTMENT FOR FURTHER REFILLS)  . lansoprazole (PREVACID) 15 MG capsule Take 15 mg by mouth daily at 6 (six) AM.  . metFORMIN (GLUCOPHAGE-XR) 500 MG 24 hr tablet TAKE 1 TABLET BY MOUTH EVERY EVENING  . Multiple Vitamins-Minerals (MULTI-VITAMIN GUMMIES PO) Take by mouth. Taking 1 daily  . QUEtiapine (SEROQUEL) 100 MG tablet Take 1 tablet (100 mg total) by mouth at bedtime.  [name] venlafaxine XR (EFFEXOR-XR) 75 MG 24 hr capsule Take 1 capsule (75 mg total) by mouth daily with breakfast.  . zaleplon (SONATA) 10 MG capsule Take 1 capsule (10 mg total) by mouth at bedtime as needed for sleep.  [name] atorvastatin (LIPITOR) 80 MG tablet Take 1 tablet (80 mg total) by mouth daily.  . metoprolol tartrate (LOPRESSOR) 25 MG tablet Take 1 tablet (25 mg total) by mouth 2 (two) times daily.   No facility-administered medications prior to visit.    Review of Systems  Constitutional: Negative.  Negative for chills, fever and weight loss.  HENT: Negative.   Respiratory: Negative.  Negative for cough.   Cardiovascular: Negative.   Gastrointestinal: Positive for abdominal pain (LLQ mild occasional), diarrhea and flatus. Negative for abdominal distention, anal bleeding, bloating, blood in stool,  constipation, nausea, rectal pain and vomiting.  Genitourinary: Negative.   Musculoskeletal: Negative.  Negative for arthralgias.  Neurological: Negative for headaches.  Psychiatric/Behavioral: Negative.       Objective    BP 110/86   Pulse 72   Temp 98.2 F (36.8 C) (Oral)   Resp 15   Wt 144 lb 3.2 oz (65.4 kg)   SpO2 98%   BMI 25.54 kg/m    Physical Exam Vitals reviewed.  Constitutional:      General: She is not in acute distress.    Appearance: Normal appearance. She is not ill-appearing, toxic-appearing or diaphoretic.     Comments: Patient appers well, not sickly. Speaking in complete sentences. Patient moves on and off of exam table and in room without difficulty. Gait is normal in hall and in room. Patient is oriented to person place time and situation. Patient answers questions appropriately and engages eye contact and verbal dialect with provider.   HENT:     Head: Normocephalic and atraumatic.  Right Ear: External ear normal.     Left Ear: External ear normal.     Nose: Nose normal.     Mouth/Throat:     Pharynx: Oropharynx is clear.  Eyes:     Conjunctiva/sclera: Conjunctivae normal.  Cardiovascular:     Rate and Rhythm: Normal rate and regular rhythm.     Pulses: Normal pulses.     Heart sounds: Normal heart sounds. No murmur heard.  No friction rub. No gallop.   Pulmonary:     Effort: Pulmonary effort is normal. No respiratory distress.     Breath sounds: Normal breath sounds. No stridor. No wheezing, rhonchi or rales.  Chest:     Chest wall: No tenderness.  Abdominal:     General: There is no distension.     Palpations: Abdomen is soft.     Tenderness: There is no abdominal tenderness.  Musculoskeletal:        General: Normal range of motion.     Cervical back: Normal range of motion. No rigidity or tenderness.  Lymphadenopathy:     Cervical: No cervical adenopathy.  Skin:    General: Skin is warm.     Findings: No bruising, erythema, lesion  or rash.  Neurological:     General: No focal deficit present.     Mental Status: She is alert.  Psychiatric:        Mood and Affect: Mood normal.        Behavior: Behavior normal.        Thought Content: Thought content normal.        Judgment: Judgment normal.      No results found for any visits on 01/27/20.  Assessment & Plan    Diarrhea of presumed infectious origin - Plan: CBC with Differential/Platelet, Comprehensive Metabolic Panel (CMET), Cdiff NAA+O+P+Stool Culture, azithromycin (ZITHROMAX) 250 MG tablet   Meds ordered this encounter  Medications  . azithromycin (ZITHROMAX) 250 MG tablet    Sig: 2 by mouth today, then 1 daily for 4 days    Dispense:  6 tablet    Refill:  0    The 'BRAT' diet is suggested, then progress to diet as tolerated as symptoms abate. Call if bloody stools, persistent diarrhea, vomiting, fever or abdominal pain. Increase hydration. Red Flags discussed. The patient was given clear instructions to go to ER or return to medical center if any red flags develop, symptoms do not improve, worsen or new problems develop. They verbalized understanding.   Return in about 1 week (around 02/03/2020), or if symptoms worsen or fail to improve, for Go to Emergency room/ urgent care if worse, at any time for any worsening symptoms.     Addressed  acute medical problems today requiring 32  minutes reviewing her medical record, counseling patient regarding her conditions and coordination of care.      Marcille Buffy, [place] [phone] (phone) [phone] (fax)  [place]

## 2020-01-28 LAB — COMPREHENSIVE METABOLIC PANEL
ALT: 29 IU/L (ref 0–32)
AST: 37 IU/L (ref 0–40)
Albumin/Globulin Ratio: 1.5 (ref 1.2–2.2)
Albumin: 4.5 g/dL (ref 3.8–4.8)
Alkaline Phosphatase: 144 IU/L — ABNORMAL HIGH (ref 44–121)
BUN/Creatinine Ratio: 9 — ABNORMAL LOW (ref 12–28)
BUN: 8 mg/dL (ref 8–27)
Bilirubin Total: 0.4 mg/dL (ref 0.0–1.2)
CO2: 23 mmol/L (ref 20–29)
Calcium: 9.9 mg/dL (ref 8.7–10.3)
Chloride: 101 mmol/L (ref 96–106)
Creatinine, Ser: 0.93 mg/dL (ref 0.57–1.00)
GFR calc Af Amer: 75 mL/min/{1.73_m2} (ref >59)
GFR calc non Af Amer: 65 mL/min/{1.73_m2} (ref >59)
Globulin, Total: 3.1 g/dL (ref 1.5–4.5)
Glucose: 99 mg/dL (ref 65–99)
Potassium: 3.9 mmol/L (ref 3.5–5.2)
Sodium: 140 mmol/L (ref 134–144)
Total Protein: 7.6 g/dL (ref 6.0–8.5)

## 2020-01-28 LAB — CBC WITH DIFFERENTIAL/PLATELET
Basophils Absolute: 0 10*3/uL (ref 0.0–0.2)
Basos: 0 %
EOS (ABSOLUTE): 0.3 10*3/uL (ref 0.0–0.4)
Eos: 3 %
Hematocrit: 45.4 % (ref 34.0–46.6)
Hemoglobin: 15.2 g/dL (ref 11.1–15.9)
Immature Grans (Abs): 0 10*3/uL (ref 0.0–0.1)
Immature Granulocytes: 0 %
Lymphocytes Absolute: 2.6 10*3/uL (ref 0.7–3.1)
Lymphs: 30 %
MCH: 30.3 pg (ref 26.6–33.0)
MCHC: 33.5 g/dL (ref 31.5–35.7)
MCV: 90 fL (ref 79–97)
Monocytes Absolute: 0.4 10*3/uL (ref 0.1–0.9)
Monocytes: 4 %
Neutrophils Absolute: 5.2 10*3/uL (ref 1.4–7.0)
Neutrophils: 63 %
Platelets: 197 10*3/uL (ref 150–450)
RBC: 5.02 x10E6/uL (ref 3.77–5.28)
RDW: 14.1 % (ref 11.7–15.4)
WBC: 8.4 10*3/uL (ref 3.4–10.8)

## 2020-01-28 NOTE — Progress Notes (Signed)
  Cbc within normal limits. CMP mildly elevated Alk phos, AST and ALT within normal limits. Sometimes diarrhea or bacterial infection can raise this lab. Recheck in one month of CMP advised.  Can add lab to orders for patient to come in and repeat lab and follow up with PCP if still elevated.

## 2020-01-29 DIAGNOSIS — R197 Diarrhea, unspecified: Secondary | ICD-10-CM | POA: Diagnosis not present

## 2020-02-01 ENCOUNTER — Telehealth: Payer: Self-pay

## 2020-02-01 NOTE — Telephone Encounter (Signed)
Patient was advised and states that she was will come have her labs rechecked. She wants Sharyn Lull to know that she do have to repeat labs with Dr. Saunders Revel and may can have it rechecked with him in a couple of weeks. Just a [place]

## 2020-02-01 NOTE — Telephone Encounter (Signed)
Noted  

## 2020-02-01 NOTE — Telephone Encounter (Signed)
-----  Message from Doreen Beam, [place] sent at 01/28/2020  3:14 PM EDT -----  Cbc within normal limits. CMP mildly elevated Alk phos, AST and ALT within normal limits. Sometimes diarrhea or bacterial infection can raise this lab. Recheck in one month of CMP advised.  Can add lab to orders for patient to come in and repeat lab and follow up with PCP if still elevated.

## 2020-02-05 ENCOUNTER — Telehealth: Payer: Self-pay

## 2020-02-05 NOTE — Telephone Encounter (Signed)
Patient is calling back to check on the status of the stool test. Patient reports that she is still having problems. Please advise

## 2020-02-05 NOTE — Telephone Encounter (Signed)
Negative stool culture so far Ova and parasite still pending, if symptoms not resolving advise follow up in office and GI evaluation.  Seek care if emergent symptoms over the weekend at anytime.   Cdiff NAA+O+P+Stool Culture Order: [id number] Status:  Preliminary result   Visible to patient:  No (not released) Next appt:  03/16/2020 at 03:40 PM in Cardiology Harrell Gave End, MD) Dx:  Diarrhea of presumed infectious origin Specimen Information: Stool   ST      0 Result Notes  Ref Range & Units 7 d ago  Salmonella/Shigella Screen  Final report   Stool Culture result 1 ([facility]ASHR)  Comment   Comment: No Salmonella or Shigella recovered.  Campylobacter Culture  Final report   Stool Culture result 1 (CMPCXR)  Comment   Comment: No Campylobacter species isolated.  E coli, Shiga toxin Assay Negative Negative   OVA + PARASITE EXAM  WILL FOLLOW P   Toxigenic C. Difficile by PCR Negative Negative   Resulting Agency  [id number]    Narrative Performed by: Maryan Puls Performed at:  [address]  [address], [place], [place]  [id number]  Lab Director: Rush Farmer MD, Phone:  [phone]    Specimen Collected: 01/29/20 16:55

## 2020-02-05 NOTE — Telephone Encounter (Signed)
Patient has been advised. KW 

## 2020-02-05 NOTE — Telephone Encounter (Signed)
Copied from [place] [id number]. Topic: General - Inquiry >> Feb 05, 2020 11:31 AM Scherrie Gerlach wrote: Reason for CRM: pt would like results of stool test.  She states she is still having the problem, and was to wait for results before she started the abx. Results are back.[name]

## 2020-02-05 NOTE — Telephone Encounter (Signed)
I dont remember a result note on this, please review labs and advise. KW

## 2020-02-08 LAB — CDIFF NAA+O+P+STOOL CULTURE
E coli, Shiga toxin Assay: NEGATIVE
Toxigenic C. Difficile by PCR: NEGATIVE

## 2020-02-08 NOTE — Progress Notes (Signed)
Negative stool culture resulted 02/08/20 return to office if any symptoms persisting.

## 2020-02-11 ENCOUNTER — Other Ambulatory Visit: Payer: Self-pay | Admitting: Internal Medicine

## 2020-02-22 ENCOUNTER — Other Ambulatory Visit: Payer: Self-pay | Admitting: Internal Medicine

## 2020-02-29 ENCOUNTER — Other Ambulatory Visit: Payer: Self-pay | Admitting: Internal Medicine

## 2020-03-10 ENCOUNTER — Other Ambulatory Visit: Payer: Self-pay | Admitting: Internal Medicine

## 2020-03-14 ENCOUNTER — Other Ambulatory Visit
Admission: RE | Admit: 2020-03-14 | Discharge: 2020-03-14 | Disposition: A | Discharge status: Home / Self Care | Payer: BC Managed Care – PPO | Attending: Internal Medicine | Admitting: Internal Medicine

## 2020-03-14 DIAGNOSIS — E785 Hyperlipidemia, unspecified: Secondary | ICD-10-CM | POA: Diagnosis not present

## 2020-03-14 LAB — LIPID PANEL
Cholesterol: 138 mg/dL (ref 0–200)
HDL: 44 mg/dL (ref >40)
LDL Cholesterol: 53 mg/dL (ref 0–99)
Total CHOL/HDL Ratio: 3.1 RATIO
Triglycerides: 203 mg/dL — ABNORMAL HIGH (ref <150)
VLDL: 41 mg/dL — ABNORMAL HIGH (ref 0–40)

## 2020-03-16 ENCOUNTER — Other Ambulatory Visit: Payer: Self-pay

## 2020-03-16 ENCOUNTER — Encounter: Payer: Self-pay | Admitting: Internal Medicine

## 2020-03-16 ENCOUNTER — Ambulatory Visit (INDEPENDENT_AMBULATORY_CARE_PROVIDER_SITE_OTHER): Payer: BC Managed Care – PPO | Admitting: Internal Medicine

## 2020-03-16 VITALS — BP 114/62 | HR 81 | Ht 63.0 in | Wt 141.1 lb

## 2020-03-16 DIAGNOSIS — I2510 Atherosclerotic heart disease of native coronary artery without angina pectoris: Secondary | ICD-10-CM | POA: Diagnosis not present

## 2020-03-16 DIAGNOSIS — I5032 Chronic diastolic (congestive) heart failure: Secondary | ICD-10-CM

## 2020-03-16 DIAGNOSIS — E785 Hyperlipidemia, unspecified: Secondary | ICD-10-CM

## 2020-03-16 DIAGNOSIS — G4733 Obstructive sleep apnea (adult) (pediatric): Secondary | ICD-10-CM | POA: Diagnosis not present

## 2020-03-16 DIAGNOSIS — I739 Peripheral vascular disease, unspecified: Secondary | ICD-10-CM | POA: Diagnosis not present

## 2020-03-16 DIAGNOSIS — E1151 Type 2 diabetes mellitus with diabetic peripheral angiopathy without gangrene: Secondary | ICD-10-CM

## 2020-03-16 NOTE — Patient Instructions (Signed)
Medication Instructions:  NONE *If you need a refill on your cardiac medications before your next appointment, please call your pharmacy*   Lab Work:  NONE  If you have labs (blood work) drawn today and your tests are completely normal, you will receive your results only by: [name] MyChart Message (if you have MyChart) OR . A paper copy in the mail If you have any lab test that is abnormal or we need to change your treatment, we will call you to review the results.   Testing/Procedures:  NONE   Follow-Up: At [facility], you and your health needs are our priority.  As part of our continuing mission to provide you with exceptional heart care, we have created designated Provider Care Teams.  These Care Teams include your primary Cardiologist (physician) and Advanced Practice Providers (APPs -  Physician Assistants and Nurse Practitioners) who all work together to provide you with the care you need, when you need it.  We recommend signing up for the patient portal called "MyChart".  Sign up information is provided on this After Visit Summary.  MyChart is used to connect with patients for Virtual Visits (Telemedicine).  Patients are able to view lab/test results, encounter notes, upcoming appointments, etc.  Non-urgent messages can be sent to your provider as well.   To learn more about what you can do with MyChart, go to [email or web address].    Your next appointment:   12 month(s)  The format for your next appointment:   In Person  Provider:   You may see Nelva Bush, MD or one of the following Advanced Practice Providers on your designated Care Team:    Murray Hodgkins, NP  Christell Faith, PA-C  Marrianne Mood, PA-C  Cadence [place], [place]  Laurann Montana, NP    Other Instructions

## 2020-03-16 NOTE — Progress Notes (Signed)
Follow-up Outpatient Visit Date: 03/16/2020  Primary Care Provider: [name], MD [address] [place] [address]  Chief Complaint: Follow-up HFpEF and CAD  HPI:  [name] is a 64 y.o. female with history of nonobstructive CAD, PAD status post left iliac stent,chronicHFpEF,diet-controlled DM2, thoracic outlet syndrome status post bilateral rib resections (0623), GERD complicated by esophageal stricture, HLD, and anxiety/depression, who presents for follow-up of chronic HFpEF.  I last saw her in late June, at which time [name] reported stable exertional dyspnea.  We agreed to increase atorvastatin to 80 mg daily for target LDL < 70; LDL was 53 on lipid panel earlier this week.  Today, [name] reports that her breathing is a little better than at past visits.  She still experiences mild exertional dyspnea but feels like she is building up her exercise capacity.  She exercises several days a week.  She denies chest pain, palpitations, lightheadedness, and edema.  She is tolerating her medications well.  --------------------------------------------------------------------------------------------------  Cardiovascular History & Procedures: Cardiovascular Problems:  Nonobstructive coronary artery disease  Peripheral vascular status post left iliac stent  HFpEF  Risk Factors:  Diabetes mellitus, hyperlipidemia, peripheral vascular disease  Cath/PCI:  L/RHC (11/08/2017): LMCA with minimal diffuse disease. LAD normal. 30% stenosis at first septal perforator. LCx with mild luminal irregularities. OM1 with 30% disease. RCA with sequential 30% proximal and mid stenoses.Upper normal filling pressures and normal pulmonary artery pressure with moderately reduced Fick cardiac output (CO 3.1/CI 1.8).  LHC (01/12/16): LMCA and LAD normal. There is a 40% stenosis in the proximal aspect of the large first septal perforator. There is 30% OM1 stenosis. 30%  proximal and mid RCA lesions are also present. LVEDP mildly elevated at 15-20 mmHg.  CV Surgery:  Remote left iliac stent.  EP Procedures and Devices:  None.  Non-Invasive Evaluation(s):  TTE (04/14/2019): Normal LV size and wall thickness with LVEF 60-65%. There is grade 1 diastolic dysfunction with elevated filling pressures. Normal RV size and function. No significant valvular abnormality. Normal CVP. Unable to assess PA pressure.  Pharmacologic MPI (03/30/2019): Low risk study withotu ischemia or scar. Coronary artery calcification noted. LVEF >65%.  TTE (11/06/2017): Normal LV size and wall thickness. LVEF 50-55% with hypokinesis of the anteroseptal and anterior myocardium. Grade 2 diastolic dysfunction with elevated filling pressures. Mild MR. Normal RV size and function. Mild pulmonary hypertension (PASP 35 mmHg).  Pharmacologic myocardial perfusion stress test (12/23/15): Small, mild area of reversible defect involving the apical anterior and apical segments that could reflect ischemia or breast attenuation. LVEF 55-65%. Borderline TID was noted.  Transthoracic echocardiogram (12/28/15): Normal LV systolic and diastolic function (EF 76-28%). No significant valvular abnormalities. Normal RV size and function.  Aortoiliac and lower extremity Dopplers:Patentstent in the left common iliac artery. No significant abnormalities. ABI right 1.05, left 1.03.   Recent CV Pertinent Labs: Lab Results  Component Value Date   CHOL 138 03/14/2020   CHOL 160 04/17/2019   HDL 44 03/14/2020   HDL 55 04/17/2019   LDLCALC 53 03/14/2020   LDLCALC 76 04/17/2019   TRIG 203 (H) 03/14/2020   CHOLHDL 3.1 03/14/2020   INR 1.06 01/06/2016   BNP 731.0 (H) 11/05/2017   K 3.9 01/27/2020   MG 2.5 (H) 11/08/2017   BUN 8 01/27/2020   CREATININE 0.93 01/27/2020    Past medical and surgical history were reviewed and updated in EPIC.  Current Meds  Medication Sig  . acetaminophen  (TYLENOL) 500 MG  tablet Take 500 mg by mouth every 6 (six) hours as needed.  [name] aspirin EC 81 MG tablet Take 1 tablet (81 mg total) by mouth daily.  [name] atorvastatin (LIPITOR) 80 MG tablet TAKE 1 TABLET DAILY  . b complex vitamins capsule Take 1 capsule by mouth daily.  . Cholecalciferol (VITAMIN D3) 5000 units TABS Take 1 tablet by mouth daily.   . Ferrous Sulfate (IRON PO) Take by mouth. Taking 1 capsule daily  . furosemide (LASIX) 20 MG tablet TAKE 1 TABLET DAILY  . glucose blood test strip ONETOUCH ULTRA MINI STRIPS AND LANCETS. Use one daily to check blood sugar.  . KLOR-CON M20 20 MEQ tablet TAKE 2 TABLETS TWICE A DAY (NEEDS APPOINTMENT FOR FURTHER REFILLS)  . lansoprazole (PREVACID) 15 MG capsule Take 15 mg by mouth daily at 6 (six) AM.  . metFORMIN (GLUCOPHAGE-XR) 500 MG 24 hr tablet TAKE 1 TABLET BY MOUTH EVERY EVENING  . metoprolol tartrate (LOPRESSOR) 25 MG tablet TAKE 1 TABLET TWICE A DAY  . Multiple Vitamins-Minerals (MULTI-VITAMIN GUMMIES PO) Take by mouth. Taking 1 daily  . QUEtiapine (SEROQUEL) 100 MG tablet Take 1 tablet (100 mg total) by mouth at bedtime.  [name] venlafaxine XR (EFFEXOR-XR) 75 MG 24 hr capsule Take 1 capsule (75 mg total) by mouth daily with breakfast.  . zaleplon (SONATA) 10 MG capsule Take 1 capsule (10 mg total) by mouth at bedtime as needed for sleep.    Allergies: Lovastatin, Paroxetine hcl, and Lamotrigine  Social History   Tobacco Use  . Smoking status: Former Smoker    Packs/day: 1.00    Years: 40.00    Pack years: 40.00    Types: Cigarettes    Start date: 03/01/1975    Quit date: 08/11/2017    Years since quitting: 2.5  . Smokeless tobacco: Never Used  Vaping Use  . Vaping Use: Former  Substance Use Topics  . Alcohol use: No    Alcohol/week: 0.0 standard drinks  . Drug use: No    Family History  Problem Relation Age of Onset  . Lung cancer Mother        was a smoker  . Emphysema Mother   . Cancer Mother        Lung  . Alcohol abuse  Mother   . Depression Mother   . Heart disease Mother   . Drug abuse Sister   . Breast cancer Sister        x 2  (30&50)  . Emphysema Sister   . Bipolar disorder Sister   . Alcohol abuse Other   . Depression Other   . Bipolar disorder Other   . Heart disease Other   . Vision loss Other   . Alcohol abuse Father   . Mood Disorder Father   . Heart disease Maternal Grandmother   . Stroke Maternal Grandmother   . Esophageal cancer Maternal Aunt   . Colon cancer Neg Hx   . Colon polyps Neg Hx   . Rectal cancer Neg Hx   . Stomach cancer Neg Hx     Review of Systems: A 12-system review of systems was performed and was negative except as noted in the HPI.  --------------------------------------------------------------------------------------------------  Physical Exam: BP 114/62 (BP Location: Left Arm, Patient Position: Sitting, Cuff Size: Normal)   Pulse 81   Ht 5\' 3"  (1.6 m)   Wt 141 lb 2 oz (64 kg)   SpO2 96%   BMI 25.00 kg/m   General: NAD. Neck: No  JVD or HJR. Lungs: Clear to auscultation bilaterally. Heart: Regular rate and rhythm without murmurs, rubs, or gallops. Abdomen: Soft, nontender, nondistended. Extremities: No lower extremity edema.  EKG: Normal sinus rhythm with left bundle branch block.  No significant change from prior tracing on 09/30/2019.  Lab Results  Component Value Date   WBC 8.4 01/27/2020   HGB 15.2 01/27/2020   HCT 45.4 01/27/2020   MCV 90 01/27/2020   PLT 197 01/27/2020    Lab Results  Component Value Date   NA 140 01/27/2020   K 3.9 01/27/2020   CL 101 01/27/2020   CO2 23 01/27/2020   BUN 8 01/27/2020   CREATININE 0.93 01/27/2020   GLUCOSE 99 01/27/2020   ALT 29 01/27/2020    Lab Results  Component Value Date   CHOL 138 03/14/2020   HDL 44 03/14/2020   LDLCALC 53 03/14/2020   TRIG 203 (H) 03/14/2020   CHOLHDL 3.1 03/14/2020     --------------------------------------------------------------------------------------------------  ASSESSMENT AND PLAN: Chronic HFpEF: [name] continues to improve with improvement in her functional capacity, currently reporting NYHA class II symptoms.  She appears euvolemic on examination today.  Blood pressure is well controlled.  We will continue her current medications, including furosemide, potassium, and metoprolol.  I have encouraged her to continue to work on improving her conditioning.  Coronary artery disease: Nonobstructive CAD by 2 prior catheterizations.  Continue aspirin and statin therapy for secondary prevention.  PAD: No claudication reported with history of iliac stenting many years ago.  Continue aspirin and statin therapy for secondary prevention.  Hyperlipidemia: Lipid panel earlier this week was notable for LDL at goal.  Triglycerides were mildly elevated.  We have agreed to continue with lifestyle modifications to improve triglycerides.  [name] should remain on atorvastatin 80 mg daily.  Type 2 diabetes mellitus: Ongoing management per Dr. Caryn Section.  Given history of HFpEF, addition of empagliflozin could be considered in the future.  Follow-up: Return to clinic in 1 year.  Nelva Bush, MD 03/16/2020 4:02 PM

## 2020-04-08 ENCOUNTER — Telehealth (INDEPENDENT_AMBULATORY_CARE_PROVIDER_SITE_OTHER): Payer: BC Managed Care – PPO | Admitting: Psychiatry

## 2020-04-08 ENCOUNTER — Encounter (HOSPITAL_COMMUNITY): Payer: Self-pay | Admitting: Psychiatry

## 2020-04-08 ENCOUNTER — Other Ambulatory Visit: Payer: Self-pay

## 2020-04-08 DIAGNOSIS — F4310 Post-traumatic stress disorder, unspecified: Secondary | ICD-10-CM | POA: Diagnosis not present

## 2020-04-08 DIAGNOSIS — F3170 Bipolar disorder, currently in remission, most recent episode unspecified: Secondary | ICD-10-CM | POA: Diagnosis not present

## 2020-04-08 MED ORDER — ZALEPLON 10 MG PO CAPS: 10.0000 mg | ORAL_CAPSULE | Freq: Every evening | ORAL | 2 refills | Status: DC | PRN

## 2020-04-08 MED ORDER — VENLAFAXINE HCL ER 75 MG PO CP24: 75.0000 mg | ORAL_CAPSULE | Freq: Every day | ORAL | 1 refills | Status: DC

## 2020-04-08 MED ORDER — QUETIAPINE FUMARATE 100 MG PO TABS: 100.0000 mg | ORAL_TABLET | Freq: Every day | ORAL | 1 refills | Status: DC

## 2020-04-08 NOTE — Progress Notes (Signed)
[facility] MD OP Progress Note  Virtual Visit via Video Note  I connected with Katelyn Cooper on 04/08/20 at 10:20 AM EST by a video enabled telemedicine application and verified that I am speaking with the correct person using two identifiers.  Location: Patient: Home Provider: Clinic   I discussed the limitations of evaluation and management by telemedicine and the availability of in person appointments. The patient expressed understanding and agreed to proceed.  I provided 15 minutes of non-face-to-face time during this encounter.     04/08/2020 10:24 AM Katelyn Cooper  MRN:  [id number]  Chief Complaint:  " I am doing good, concerned about the ongoing pandemic."  HPI: Patient informed that she is doing well.  She stated that everything is going well for now however things would have been much better if this pandemic will be over.  She stated that she is really worried about getting it due to her multiple health conditions.  She stated that she and her close family members have all been vaccinated. She stated that other than staying indoors due to the pandemic she has nothing new to report.  She informed that she had talked a few days before Christmas and that was heartbreaking for her but other than that things have been okay.  Visit Diagnosis:    ICD-10-CM   1. Bipolar disorder in remission (HCC)  F31.70 zaleplon (SONATA) 10 MG capsule    QUEtiapine (SEROQUEL) 100 MG tablet    venlafaxine XR (EFFEXOR-XR) 75 MG 24 hr capsule  2. Post traumatic stress disorder (PTSD)  F43.10 venlafaxine XR (EFFEXOR-XR) 75 MG 24 hr capsule    Past Psychiatric History: PTSD, Bipolar d/o  Past Medical History:  Past Medical History:  Diagnosis Date  . (HFpEF) heart failure with preserved ejection fraction ([place])    a. 12/2015 Echo: EF 55-60%, nl RV size/fxn, no significant valvular abnormalities; b. 10/2017 Echo: EF 50-55%, antsept, ant HK. Gr2 DD. Mild MR. Nl RV fxn. PASP 51mmHg.  [name] Anxiety   . Basal  cell carcinoma 08/08/2017   L nose above mid nasal alar groove  . CHF (congestive heart failure) ([place])   . Depression   . Diabetes type 2, controlled ([place])    diet-controlled  . Esophageal stricture   . GERD (gastroesophageal reflux disease)   . Hiatal hernia   . History of chicken pox   . Hyperlipidemia   . Hypertension   . Internal hemorrhoids   . Non-obstructive CAD (coronary artery disease)    a. 12/2015 MV: apical ant and apical reversible defect. EF 55-65%; b. 01/2016 Cath: LM nl, LAD nl, SP1 40ost, LCX nl, OM1 30, RCA 30p/m, EDP 15-20mmHg; c. 10/2017 Cath: LM nl, LAD nl, SP1 30ost, LCX nl, OM1 30, RCA 30p/m. EF 55%.  . Obstructive sleep apnea   . PAD (peripheral artery disease) ([place])    a. 1990's s/p prior LCIA stenting; b. 12/2015 ABI: R 1.05, L 1.03.  . Panic disorder     Past Surgical History:  Procedure Laterality Date  . [place]   stent placement; ? left iliac artery intervention  . APPENDECTOMY  1998  . BASAL CELL CARCINOMA EXCISION    . CARDIAC CATHETERIZATION N/A 01/12/2016   Procedure: Left Heart Cath and Coronary Angiography;  Surgeon: Nelva Bush, MD;  Location: [facility];  Service: Cardiovascular;  Laterality: N/A;  . COLONOSCOPY  07/17/2019  . FOOT SURGERY     Right  . pap smear  03/2010  Done by [name], normal  . REFRACTIVE SURGERY     right  . RIB RESECTION  [id number]  . RIGHT/LEFT HEART CATH AND CORONARY ANGIOGRAPHY N/A 11/08/2017   Procedure: RIGHT/LEFT HEART CATH AND CORONARY ANGIOGRAPHY;  Surgeon: Wellington Hampshire, MD;  Location: [facility];  Service: Cardiovascular;  Laterality: N/A;  . SHOULDER SURGERY  2005   left  . UPPER GASTROINTESTINAL ENDOSCOPY  07/17/2019  . UPPER GI ENDOSCOPY  09/19/2011   Stricture at GE junction, dilated. Mild gastritis and duodenitis. Small hiatal hernia    Family Psychiatric History: sister- bipolar d/o,mom - depression  Family History:  Family History  Problem  Relation Age of Onset  . Lung cancer Mother        was a smoker  . Emphysema Mother   . Cancer Mother        Lung  . Alcohol abuse Mother   . Depression Mother   . Heart disease Mother   . Drug abuse Sister   . Breast cancer Sister        x 2  (30&50)  . Emphysema Sister   . Bipolar disorder Sister   . Alcohol abuse Other   . Depression Other   . Bipolar disorder Other   . Heart disease Other   . Vision loss Other   . Alcohol abuse Father   . Mood Disorder Father   . Heart disease Maternal Grandmother   . Stroke Maternal Grandmother   . Esophageal cancer Maternal Aunt   . Colon cancer Neg Hx   . Colon polyps Neg Hx   . Rectal cancer Neg Hx   . Stomach cancer Neg Hx     Social History:  Social History   Socioeconomic History  . Marital status: Married    Spouse name: mark  . Number of children: 0  . Years of education: Not on file  . Highest education level: High school graduate  Occupational History  . Occupation: computer-retired    Employer: [employer]  Tobacco Use  . Smoking status: Former Smoker    Packs/day: 1.00    Years: 40.00    Pack years: 40.00    Types: Cigarettes    Start date: 03/01/1975    Quit date: 08/11/2017    Years since quitting: 2.6  . Smokeless tobacco: Never Used  Vaping Use  . Vaping Use: Former  Substance and Sexual Activity  . Alcohol use: No    Alcohol/week: 0.0 standard drinks  . Drug use: No  . Sexual activity: Not Currently  Other Topics Concern  . Not on file  Social History Narrative  . Not on file   Social Determinants of Health   Financial Resource Strain: Not on file  Food Insecurity: Not on file  Transportation Needs: Not on file  Physical Activity: Not on file  Stress: Not on file  Social Connections: Not on file    Allergies:  Allergies  Allergen Reactions  . Lovastatin     depression  . Paroxetine Hcl     itching  . Lamotrigine Rash    Metabolic Disorder Labs: Lab Results  Component Value Date    HGBA1C 6.7 (A) 10/16/2019   No results found for: PROLACTIN Lab Results  Component Value Date   CHOL 138 03/14/2020   TRIG 203 (H) 03/14/2020   HDL 44 03/14/2020   CHOLHDL 3.1 03/14/2020   VLDL 41 (H) 03/14/2020   LDLCALC 53 03/14/2020   LDLCALC 76 04/17/2019  Lab Results  Component Value Date   TSH 1.550 04/17/2019   TSH 1.26 12/10/2013    Therapeutic Level Labs: No results found for: LITHIUM No results found for: VALPROATE No components found for:  CBMZ  Current Medications: Current Outpatient Medications  Medication Sig Dispense Refill  . acetaminophen (TYLENOL) 500 MG tablet Take 500 mg by mouth every 6 (six) hours as needed.    [name] aspirin EC 81 MG tablet Take 1 tablet (81 mg total) by mouth daily.    [name] atorvastatin (LIPITOR) 80 MG tablet TAKE 1 TABLET DAILY 90 tablet 3  . b complex vitamins capsule Take 1 capsule by mouth daily.    . Cholecalciferol (VITAMIN D3) 5000 units TABS Take 1 tablet by mouth daily.   0  . Ferrous Sulfate (IRON PO) Take by mouth. Taking 1 capsule daily    . furosemide (LASIX) 20 MG tablet TAKE 1 TABLET DAILY 90 tablet 3  . glucose blood test strip ONETOUCH ULTRA MINI STRIPS AND LANCETS. Use one daily to check blood sugar. 100 each 3  . KLOR-CON M20 20 MEQ tablet TAKE 2 TABLETS TWICE A DAY (NEEDS APPOINTMENT FOR FURTHER REFILLS) 360 tablet 0  . lansoprazole (PREVACID) 15 MG capsule Take 15 mg by mouth daily at 6 (six) AM.    . metFORMIN (GLUCOPHAGE-XR) 500 MG 24 hr tablet TAKE 1 TABLET BY MOUTH EVERY EVENING 90 tablet 1  . metoprolol tartrate (LOPRESSOR) 25 MG tablet TAKE 1 TABLET TWICE A DAY 180 tablet 0  . Multiple Vitamins-Minerals (MULTI-VITAMIN GUMMIES PO) Take by mouth. Taking 1 daily    . QUEtiapine (SEROQUEL) 100 MG tablet Take 1 tablet (100 mg total) by mouth at bedtime. 90 tablet 1  . venlafaxine XR (EFFEXOR-XR) 75 MG 24 hr capsule Take 1 capsule (75 mg total) by mouth daily with breakfast. 90 capsule 1  . zaleplon (SONATA) 10 MG  capsule Take 1 capsule (10 mg total) by mouth at bedtime as needed for sleep. 30 capsule 2   No current facility-administered medications for this visit.     Musculoskeletal: Strength & Muscle Tone: unable to assess due to telemed visit Gait & Station: unable to assess due to telemed visit Patient leans: unable to assess due to telemed visit   Psychiatric Specialty Exam: ROS  There were no vitals taken for this visit.There is no height or weight on file to calculate BMI.  General Appearance: Fairly Groomed  Eye Contact:  Good  Speech:  Clear and Coherent and Normal Rate  Volume:  Normal  Mood:  Euthymic  Affect:  Congruent  Thought Process:  Goal Directed, Linear and Descriptions of Associations: Intact  Orientation:  Full (Time, Place, and Person)  Thought Content: Logical   Suicidal Thoughts:  No  Homicidal Thoughts:  No  Memory:  Recent;   Good Remote;   Good  Judgement:  Good  Insight:  Good  Psychomotor Activity:  Normal  Concentration:  Concentration: Good and Attention Span: Good  Recall:  Good  Fund of Knowledge: Good  Language: Good  Akathisia:  Negative  Handed:  Right  AIMS (if indicated): not tested  Assets:  Communication Skills Desire for Improvement Financial Resources/Insurance Housing Leisure Time Social Support Talents/Skills Transportation  ADL's:  Intact  Cognition: WNL  Sleep:  Good with help of Sonata   Screenings: [place] Office Visit from 03/10/2018 in [place] Total Score 0    [place]   [place]  Visit from 12/31/2017 in [place] Office Visit from 11/15/2017 in [facility] [facility] Office Visit from 09/20/2016 in [facility] [place]  PHQ-2 Total Score 0 0 0  PHQ-9 Total Score - - 3       Assessment and Plan: Patient appears to be stable on her current medication regimen.  1. Bipolar  disorder in remission (HCC)  - venlafaxine XR (EFFEXOR-XR) 75 MG 24 hr capsule; Take 1 capsule (75 mg total) by mouth daily with breakfast.  Dispense: 90 capsule; Refill: 1 - QUEtiapine (SEROQUEL) 100 MG tablet; Take 1 tablet (100 mg total) by mouth at bedtime.  Dispense: 90 tablet; Refill: 1 - Continue Sonata 10 mg HS  2. Post traumatic stress disorder (PTSD)  - venlafaxine XR (EFFEXOR-XR) 75 MG 24 hr capsule; Take 1 capsule (75 mg total) by mouth daily with breakfast.  Dispense: 90 capsule; Refill: 1  Continue same regimen. F/up in 3 months.  [place] Crane, MD 04/08/2020, 10:24 AM

## 2020-04-18 ENCOUNTER — Ambulatory Visit: Payer: BC Managed Care – PPO | Admitting: Family Medicine

## 2020-04-27 ENCOUNTER — Other Ambulatory Visit: Payer: Self-pay

## 2020-04-27 ENCOUNTER — Encounter: Payer: Self-pay | Admitting: Family Medicine

## 2020-04-27 ENCOUNTER — Ambulatory Visit (INDEPENDENT_AMBULATORY_CARE_PROVIDER_SITE_OTHER): Payer: BC Managed Care – PPO | Admitting: Family Medicine

## 2020-04-27 VITALS — BP 103/48 | HR 73 | Temp 97.0°F | Ht 63.0 in | Wt 143.0 lb

## 2020-04-27 DIAGNOSIS — E119 Type 2 diabetes mellitus without complications: Secondary | ICD-10-CM

## 2020-04-27 DIAGNOSIS — R590 Localized enlarged lymph nodes: Secondary | ICD-10-CM

## 2020-04-27 DIAGNOSIS — I10 Essential (primary) hypertension: Secondary | ICD-10-CM | POA: Diagnosis not present

## 2020-04-27 LAB — POCT GLYCOSYLATED HEMOGLOBIN (HGB A1C)
Estimated Average Glucose: 134
Hemoglobin A1C: 6.3 % — AB (ref 4.0–5.6)

## 2020-04-27 NOTE — Progress Notes (Signed)
Established patient visit   Patient: Katelyn Cooper   DOB: 1955/09/14   65 y.o. Female  MRN: [id number] Visit Date: 04/27/2020  Today's healthcare provider: Lelon Huh, MD   No chief complaint on file.  Subjective    HPI  She is here for follow up of multiple chronic medical conditions below. She also states she has had three episodes of swelling in front of her left ear over the last few months. It is not painful but looks a little red. Goes down almost completely between episodes.   Diabetes Mellitus Type II, Follow-up  Lab Results  Component Value Date   HGBA1C 6.7 (A) 10/16/2019   HGBA1C 7.2 (A) 04/17/2019   HGBA1C 7.0 (A) 09/23/2018   Wt Readings from Last 3 Encounters:  03/16/20 141 lb 2 oz (64 kg)  01/27/20 144 lb 3.2 oz (65.4 kg)  10/16/19 153 lb 9.6 oz (69.7 kg)   Last seen for diabetes 6 months ago.  Management since then includes continue same medications. She reports excellent compliance with treatment. She is not having side effects.  Symptoms: No fatigue No foot ulcerations  No appetite changes No nausea  No paresthesia of the feet  No polydipsia  No polyuria No visual disturbances   No vomiting     Home blood sugar records: N/A  Episodes of hypoglycemia? No    Current insulin regiment: none Most Recent Eye Exam: 12/17/2019 Current exercise: walking Current diet habits: well balanced  Pertinent Labs: Lab Results  Component Value Date   CHOL 138 03/14/2020   HDL 44 03/14/2020   LDLCALC 53 03/14/2020   TRIG 203 (H) 03/14/2020   CHOLHDL 3.1 03/14/2020   Lab Results  Component Value Date   NA 140 01/27/2020   K 3.9 01/27/2020   CREATININE 0.93 01/27/2020   GFRNONAA 65 01/27/2020   GFRAA 75 01/27/2020   GLUCOSE 99 01/27/2020     ---------------------------------------------------------------------------------------------------  Hypertension, follow-up  BP Readings from Last 3 Encounters:  03/16/20 114/62  01/27/20 110/86   10/16/19 106/64   Wt Readings from Last 3 Encounters:  03/16/20 141 lb 2 oz (64 kg)  01/27/20 144 lb 3.2 oz (65.4 kg)  10/16/19 153 lb 9.6 oz (69.7 kg)     She was last seen for hypertension 6 months ago.  BP at that visit was 106/64. Management since that visit includes continue same medications.  She reports excellent compliance with treatment. She is not having side effects.  She is following a Regular diet. She is exercising. She does smoke.  Use of agents associated with hypertension: NSAIDS.   Outside blood pressures are N/A. Symptoms: No chest pain No chest pressure  No palpitations No syncope  No dyspnea No orthopnea  No paroxysmal nocturnal dyspnea No lower extremity edema   Pertinent labs: Lab Results  Component Value Date   CHOL 138 03/14/2020   HDL 44 03/14/2020   LDLCALC 53 03/14/2020   TRIG 203 (H) 03/14/2020   CHOLHDL 3.1 03/14/2020   Lab Results  Component Value Date   NA 140 01/27/2020   K 3.9 01/27/2020   CREATININE 0.93 01/27/2020   GFRNONAA 65 01/27/2020   GFRAA 75 01/27/2020   GLUCOSE 99 01/27/2020     The 10-year ASCVD risk score Mikey Bussing DC Jr., et al., 2013) is: 11.3%   ---------------------------------------------------------------------------------------------------     Medications: Outpatient Medications Prior to Visit  Medication Sig  . acetaminophen (TYLENOL) 500 MG tablet Take 500 mg by mouth  every 6 (six) hours as needed.  [name] aspirin EC 81 MG tablet Take 1 tablet (81 mg total) by mouth daily.  [name] atorvastatin (LIPITOR) 80 MG tablet TAKE 1 TABLET DAILY  . b complex vitamins capsule Take 1 capsule by mouth daily.  . Cholecalciferol (VITAMIN D3) 5000 units TABS Take 1 tablet by mouth daily.   . Ferrous Sulfate (IRON PO) Take by mouth. Taking 1 capsule daily  . furosemide (LASIX) 20 MG tablet TAKE 1 TABLET DAILY  . glucose blood test strip ONETOUCH ULTRA MINI STRIPS AND LANCETS. Use one daily to check blood sugar.  . KLOR-CON M20 20  MEQ tablet TAKE 2 TABLETS TWICE A DAY (NEEDS APPOINTMENT FOR FURTHER REFILLS)  . lansoprazole (PREVACID) 15 MG capsule Take 15 mg by mouth daily at 6 (six) AM.  . metFORMIN (GLUCOPHAGE-XR) 500 MG 24 hr tablet TAKE 1 TABLET BY MOUTH EVERY EVENING  . metoprolol tartrate (LOPRESSOR) 25 MG tablet TAKE 1 TABLET TWICE A DAY  . Multiple Vitamins-Minerals (MULTI-VITAMIN GUMMIES PO) Take by mouth. Taking 1 daily  . QUEtiapine (SEROQUEL) 100 MG tablet Take 1 tablet (100 mg total) by mouth at bedtime.  [name] venlafaxine XR (EFFEXOR-XR) 75 MG 24 hr capsule Take 1 capsule (75 mg total) by mouth daily with breakfast.  . zaleplon (SONATA) 10 MG capsule Take 1 capsule (10 mg total) by mouth at bedtime as needed for sleep.   No facility-administered medications prior to visit.      Objective    BP (!) 103/48 (BP Location: Left Arm, Patient Position: Sitting, Cuff Size: Large)   Pulse 73   Temp (!) 97 F (36.1 C) (Temporal)   Ht 5\' 3"  (1.6 m)   Wt 143 lb (64.9 kg)   SpO2 97%   BMI 25.33 kg/m     Physical Exam  General appearance:  Overweight female, cooperative and in no acute distress Head: Normocephalic, without obvious abnormality, atraumatic. Very subtle swelling left pre-auricular area compared to right. No nodes palpated.  Respiratory: Respirations even and unlabored, normal respiratory rate Extremities: All extremities are intact.  Skin: Skin color, texture, turgor normal. No rashes seen  Psych: Appropriate mood and affect. Neurologic: Mental status: Alert, oriented to person, place, and time, thought content appropriate.   Results for orders placed or performed in visit on 04/27/20  POCT HgB A1C  Result Value Ref Range   Hemoglobin A1C 6.3 (A) 4.0 - 5.6 %   Estimated Average Glucose 134     Assessment & Plan     1. Controlled type 2 diabetes mellitus without complication, without long-term current use of insulin (HCC) Very well controlled, she is unable to provide urine for nephropathy  screening today. Continue current medications.  Follow up 6 months.   2. Anterior auricular lymphadenopathy Now resolved. Likely reactive. If it continues to recur then call for ENT referral.   3. Primary hypertension Well controlled.  Continue current medications.          The entirety of the information documented in the History of Present Illness, Review of Systems and Physical Exam were personally obtained by me. Portions of this information were initially documented by the CMA and reviewed by me for thoroughness and accuracy.      Lelon Huh, MD  [facility] [phone] (phone) [phone] (fax)  [place]

## 2020-05-24 ENCOUNTER — Other Ambulatory Visit: Payer: Self-pay | Admitting: Internal Medicine

## 2020-05-31 ENCOUNTER — Ambulatory Visit: Payer: Self-pay

## 2020-05-31 ENCOUNTER — Other Ambulatory Visit: Payer: Self-pay

## 2020-05-31 ENCOUNTER — Ambulatory Visit (INDEPENDENT_AMBULATORY_CARE_PROVIDER_SITE_OTHER): Payer: BC Managed Care – PPO | Admitting: Adult Health

## 2020-05-31 ENCOUNTER — Encounter: Payer: Self-pay | Admitting: Adult Health

## 2020-05-31 VITALS — BP 111/70 | HR 72 | Temp 98.4°F | Resp 16 | Wt 143.8 lb

## 2020-05-31 DIAGNOSIS — R52 Pain, unspecified: Secondary | ICD-10-CM | POA: Diagnosis not present

## 2020-05-31 DIAGNOSIS — R55 Syncope and collapse: Secondary | ICD-10-CM

## 2020-05-31 NOTE — Patient Instructions (Signed)
Syncope Syncope is when you pass out (faint) for a short time. It is caused by a sudden decrease in blood flow to the brain. Signs that you may be about to pass out include:  Feeling dizzy or light-headed.  Feeling sick to your stomach (nauseous).  Seeing all white or all black.  Having cold, clammy skin. If you pass out, get help right away. Call your local emergency services (911 in the [place] Do not drive yourself to the hospital. Follow these instructions at home: Watch for any changes in your symptoms. Take these actions to stay safe and help with your symptoms: Lifestyle  Do not drive, use machinery, or play sports until your doctor says it is okay.  Do not drink alcohol.  Do not use any products that contain nicotine or tobacco, such as cigarettes and e-cigarettes. If you need help quitting, ask your doctor.  Drink enough fluid to keep your pee (urine) pale yellow. General instructions  Take over-the-counter and prescription medicines only as told by your doctor.  If you are taking blood pressure or heart medicine, sit up and stand up slowly. Spend a few minutes getting ready to sit and then stand. This can help you feel less dizzy.  Have someone stay with you until you feel stable.  If you start to feel like you might pass out, lie down right away and raise (elevate) your feet above the level of your heart. Breathe deeply and steadily. Wait until all of the symptoms are gone.  Keep all follow-up visits as told by your doctor. This is important. Get help right away if:  You have a very bad headache.  You pass out once or more than once.  You have pain in your chest, belly, or back.  You have a very fast or uneven heartbeat (palpitations).  It hurts to breathe.  You are bleeding from your mouth or your bottom (rectum).  You have black or tarry poop (stool).  You have jerky movements that you cannot control (seizure).  You are confused.  You have trouble  walking.  You are very weak.  You have vision problems. These symptoms may be an emergency. Do not wait to see if the symptoms will go away. Get medical help right away. Call your local emergency services (911 in the [place] Do not drive yourself to the hospital. Summary  Syncope is when you pass out (faint) for a short time. It is caused by a sudden decrease in blood flow to the brain.  Signs that you may be about to faint include feeling dizzy, light-headed, or sick to your stomach, seeing all white or all black, or having cold, clammy skin.  If you start to feel like you might pass out, lie down right away and raise (elevate) your feet above the level of your heart. Breathe deeply and steadily. Wait until all of the symptoms are gone. This information is not intended to replace advice given to you by your health care provider. Make sure you discuss any questions you have with your health care provider. Document Revised: 04/29/2019 Document Reviewed: 05/01/2017 Elsevier Patient Education  2021 Elsevier Inc.  

## 2020-05-31 NOTE — Telephone Encounter (Signed)
Pt. Reports Sunday she was at home, was walking and hit her foot on a table, hard. Fainted. Husband was with her. States "I was out maybe a few seconds." No seizure activity. No injury, did not hit her head."I feel fine, but my husband wants me checked out." Appointment made. Instructed if this happens again, go to ED.  Reason for Disposition . [1] All other patients AND [2] now alert and feels fine(Exception: SIMPLE FAINT due to stress, pain, prolonged standing, or suddenly standing)  Answer Assessment - Initial Assessment Questions 1. ONSET: "How long were you unconscious?" (minutes) "When did it happen?"     A few seconds 2. CONTENT: "What happened during period of unconsciousness?" (e.g., seizure activity)      No 3. MENTAL STATUS: "Alert and oriented now?" (oriented x 3 = name, month, location)      Alert, oriented 4. TRIGGER: "What do you think caused the fainting?" "What were you doing just before you fainted?"  (e.g., exercise, sudden standing up, prolonged standing)     Got up and hit foot on table and then fainted 5. RECURRENT SYMPTOM: "Have you ever passed out before?" If Yes, ask: "When was the last time?" and "What happened that time?"      Yes - when younger 6. INJURY: "Did you sustain any injury during the fall?"      No 7. CARDIAC SYMPTOMS: "Have you had any of the following symptoms: chest pain, difficulty breathing, palpitations?"     No 8. NEUROLOGIC SYMPTOMS: "Have you had any of the following symptoms: headache, numbness, vertigo, weakness?"     No 9. GI SYMPTOMS: "Have you had any of the following symptoms: abdominal pain, vomiting, diarrhea, blood in stools?"     No 10. OTHER SYMPTOMS: "Do you have any other symptoms?"       No 11. PREGNANCY: "Is there any chance you are pregnant?" "When was your last menstrual period?"       No  Protocols used: [facility]

## 2020-05-31 NOTE — Progress Notes (Signed)
Established patient visit   Patient: Katelyn Cooper   DOB: Aug 15, 1955   65 y.o. Female  MRN: [id number] Visit Date: 05/31/2020  Today's healthcare provider: Marcille Buffy, FNP   Chief Complaint  Patient presents with  . Loss of Consciousness   Subjective    Loss of Consciousness This is a new problem. The current episode started in the past 7 days (patient reports incident happened 2 days ago while walking around her house, patient reports that she had hit her toe on table and she went on walking a few steps and she tried reaching for the wall and states that she fell straight back. ). She lost consciousness for a period of less than 1 minute (reports 15 to 30 seconds, husband witnessed and she remembers pre and post incident and feels she was even alert when she passed out. ). Nothing (pain had significant pain 3 toes right foot when hitting on furniture. denies any pain now. ) aggravates the symptoms. Associated symptoms include dizziness (mild after stumping toe resolved upon awakening. ). Pertinent negatives include no abdominal pain, auditory change, aura, back pain, bladder incontinence, bowel incontinence, chest pain, clumsiness, confusion, diaphoresis, fever, focal sensory loss, focal weakness, headaches, light-headedness, malaise/fatigue, nausea, palpitations, slurred speech, vertigo, visual change, vomiting or weakness. She has tried nothing for the symptoms. The treatment provided significant (no episodes since initial and did use to pass out as child with pain she reports. ) relief. Her past medical history is significant for CAD, DM and HTN. There is no history of arrhythmia, a clotting disorder, CVA, seizures, a sudden death in family, TIA or vertigo.    She reports she stumped her right toe barefooted and felt significant pain.She continued walking and tried grabbing the wall and missed it and hit the floor.  Denies any head pain or trauma.   She has not checked  blood pressure at home. Denies any falls prior.  She had not had snack but had eaten normally.  Blood sugar was in the 130's after incident  Denies any recent vaccines, and no new medications.   Patient  denies any fever, body aches,chills, rash, chest pain, shortness of breath, nausea, vomiting, or diarrhea.  Denies dizziness, lightheadedness, pre syncopal or syncopal episodes. Since episode named above.  " feels great today".    Patient Active Problem List   Diagnosis Date Noted  . Pain 05/31/2020  . Syncope 05/31/2020  . Type 2 diabetes mellitus with diabetic peripheral angiopathy without gangrene, without long-term current use of insulin ([place]) 03/16/2020  . Diarrhea 01/27/2020  . Angular cheilitis 10/16/2019  . Bipolar disorder in remission ([place]) 02/09/2019  . Post traumatic stress disorder (PTSD) 02/09/2019  . Chronic heart failure with preserved ejection fraction (HFpEF) ([place]) 11/15/2017  . HTN (hypertension) 11/15/2017  . Obstructive sleep apnea 11/15/2017  . NICM (nonischemic cardiomyopathy) ([place]) 11/13/2017  . CAD in native artery 11/13/2017  . Menopausal symptom 06/13/2017  . Osteopenia 06/13/2017  . Uterine leiomyoma 06/13/2017  . PAD (peripheral artery disease) ([place]) 09/19/2016  . Hyperlipidemia LDL goal <70 09/19/2016  . Coronary artery disease involving native coronary artery of native heart without angina pectoris 09/05/2016  . Abnormal stress test 01/12/2016  . Pleuritic chest pain 07/28/2015  . Duodenitis 01/05/2015  . Dermatitis, nummular 01/05/2015  . Allergic rhinitis 12/23/2014  . Anxiety 12/23/2014  . Ataxia 12/23/2014  . Back ache 12/23/2014  . Cervical muscle strain 12/23/2014  . Depression 12/23/2014  . Eczema 12/23/2014  .  GERD (gastroesophageal reflux disease) 12/23/2014  . History of anemia 12/23/2014  . Pure hypercholesterolemia 12/23/2014  . Episodic mood disorder ([place]) 12/23/2014  . Nephropyelitis 12/23/2014  . Situational stress  12/23/2014  . Tremor 12/23/2014  . Vitamin D deficiency 11/03/2013  . Diabetes mellitus type 2, controlled ([place]) 08/13/2011  . Cough 05/10/2011  . Chest pain 05/10/2011  . Tobacco abuse 05/10/2011   Past Medical History:  Diagnosis Date  . (HFpEF) heart failure with preserved ejection fraction ([place])    a. 12/2015 Echo: EF 55-60%, nl RV size/fxn, no significant valvular abnormalities; b. 10/2017 Echo: EF 50-55%, antsept, ant HK. Gr2 DD. Mild MR. Nl RV fxn. PASP 70mmHg.  [name] Anxiety   . Basal cell carcinoma 08/08/2017   L nose above mid nasal alar groove  . CHF (congestive heart failure) ([place])   . Depression   . Diabetes type 2, controlled ([place])    diet-controlled  . Esophageal stricture   . GERD (gastroesophageal reflux disease)   . Hiatal hernia   . History of chicken pox   . Hyperlipidemia   . Hypertension   . Internal hemorrhoids   . Non-obstructive CAD (coronary artery disease)    a. 12/2015 MV: apical ant and apical reversible defect. EF 55-65%; b. 01/2016 Cath: LM nl, LAD nl, SP1 40ost, LCX nl, OM1 30, RCA 30p/m, EDP 15-80mmHg; c. 10/2017 Cath: LM nl, LAD nl, SP1 30ost, LCX nl, OM1 30, RCA 30p/m. EF 55%.  . Obstructive sleep apnea   . PAD (peripheral artery disease) ([place])    a. 1990's s/p prior LCIA stenting; b. 12/2015 ABI: R 1.05, L 1.03.  . Panic disorder    Allergies  Allergen Reactions  . Lovastatin     depression  . Paroxetine Hcl     itching  . Lamotrigine Rash       Medications: Outpatient Medications Prior to Visit  Medication Sig  . acetaminophen (TYLENOL) 500 MG tablet Take 500 mg by mouth every 6 (six) hours as needed.  [name] aspirin EC 81 MG tablet Take 1 tablet (81 mg total) by mouth daily.  [name] atorvastatin (LIPITOR) 80 MG tablet TAKE 1 TABLET DAILY  . b complex vitamins capsule Take 1 capsule by mouth daily.  . Cholecalciferol (VITAMIN D3) 5000 units TABS Take 1 tablet by mouth daily.   . Ferrous Sulfate (IRON PO) Take by mouth. Taking 1 capsule daily  .  furosemide (LASIX) 20 MG tablet TAKE 1 TABLET DAILY  . glucose blood test strip ONETOUCH ULTRA MINI STRIPS AND LANCETS. Use one daily to check blood sugar.  . KLOR-CON M20 20 MEQ tablet TAKE 2 TABLETS TWICE A DAY (NEEDS APPOINTMENT FOR FURTHER REFILLS)  . lansoprazole (PREVACID) 15 MG capsule Take 15 mg by mouth daily at 6 (six) AM.  . metFORMIN (GLUCOPHAGE-XR) 500 MG 24 hr tablet TAKE 1 TABLET BY MOUTH EVERY EVENING  . metoprolol tartrate (LOPRESSOR) 25 MG tablet TAKE 1 TABLET TWICE A DAY  . Multiple Vitamins-Minerals (MULTI-VITAMIN GUMMIES PO) Take by mouth. Taking 1 daily  . QUEtiapine (SEROQUEL) 100 MG tablet Take 1 tablet (100 mg total) by mouth at bedtime.  [name] venlafaxine XR (EFFEXOR-XR) 75 MG 24 hr capsule Take 1 capsule (75 mg total) by mouth daily with breakfast.  . zaleplon (SONATA) 10 MG capsule Take 1 capsule (10 mg total) by mouth at bedtime as needed for sleep.   No facility-administered medications prior to visit.    Review of Systems  Constitutional: Negative.  Negative for diaphoresis,  fever and malaise/fatigue.  HENT: Negative.   Respiratory: Negative.   Cardiovascular: Positive for syncope. Negative for chest pain, palpitations and leg swelling.  Gastrointestinal: Negative for abdominal pain, bowel incontinence, nausea and vomiting.  Genitourinary: Negative.  Negative for bladder incontinence.  Musculoskeletal: Negative.  Negative for back pain.  Neurological: Positive for dizziness (mild after stumping toe resolved upon awakening. ). Negative for vertigo, tremors, focal weakness, seizures, syncope, weakness, light-headedness and headaches.  Psychiatric/Behavioral: Negative.  Negative for confusion.    Last CBC Lab Results  Component Value Date   WBC 8.4 01/27/2020   HGB 15.2 01/27/2020   HCT 45.4 01/27/2020   MCV 90 01/27/2020   MCH 30.3 01/27/2020   RDW 14.1 01/27/2020   PLT 197 27/25/3664   Last metabolic panel Lab Results  Component Value Date   GLUCOSE  99 01/27/2020   NA 140 01/27/2020   K 3.9 01/27/2020   CL 101 01/27/2020   CO2 23 01/27/2020   BUN 8 01/27/2020   CREATININE 0.93 01/27/2020   GFRNONAA 65 01/27/2020   GFRAA 75 01/27/2020   CALCIUM 9.9 01/27/2020   PHOS 3.5 11/08/2017   PROT 7.6 01/27/2020   ALBUMIN 4.5 01/27/2020   LABGLOB 3.1 01/27/2020   AGRATIO 1.5 01/27/2020   BILITOT 0.4 01/27/2020   ALKPHOS 144 (H) 01/27/2020   AST 37 01/27/2020   ALT 29 01/27/2020   ANIONGAP 9 04/28/2018   Last lipids Lab Results  Component Value Date   CHOL 138 03/14/2020   HDL 44 03/14/2020   LDLCALC 53 03/14/2020   TRIG 203 (H) 03/14/2020   CHOLHDL 3.1 03/14/2020   Last hemoglobin A1c Lab Results  Component Value Date   HGBA1C 6.3 (A) 04/27/2020   Last thyroid functions Lab Results  Component Value Date   TSH 1.550 04/17/2019   Last vitamin D No results found for: 25OHVITD2, 25OHVITD3, VD25OH Last vitamin B12 and Folate No results found for: VITAMINB12, FOLATE     Objective    BP 111/70   Pulse 72   Temp 98.4 F (36.9 C) (Oral)   Resp 16   Wt 143 lb 12.8 oz (65.2 kg)   SpO2 97%   BMI 25.47 kg/m  BP Readings from Last 3 Encounters:  05/31/20 111/70  04/27/20 (!) 103/48  03/16/20 114/62   Wt Readings from Last 3 Encounters:  05/31/20 143 lb 12.8 oz (65.2 kg)  04/27/20 143 lb (64.9 kg)  03/16/20 141 lb 2 oz (64 kg)       Physical Exam Vitals reviewed.  Constitutional:      General: She is not in acute distress.    Appearance: Normal appearance. She is well-developed and normal weight. She is not ill-appearing, toxic-appearing or diaphoretic.     Interventions: She is not intubated.    Comments: Patient appers well, not sickly. Speaking in complete sentences. Patient moves on and off of exam table and in room without difficulty. Gait is normal in hall and in room. Patient is oriented to person place time and situation. Patient answers questions appropriately and engages eye contact and verbal dialect  with provider.   HENT:     Head: Normocephalic and atraumatic.     Right Ear: Tympanic membrane, ear canal and external ear normal. There is no impacted cerumen.     Left Ear: Tympanic membrane, ear canal and external ear normal. There is no impacted cerumen.     Nose: Nose normal. No congestion or rhinorrhea.     Mouth/Throat:  Mouth: Mucous membranes are moist.     Pharynx: No oropharyngeal exudate or posterior oropharyngeal erythema.  Eyes:     General: Lids are normal. No scleral icterus.       Right eye: No discharge.        Left eye: No discharge.     Extraocular Movements: Extraocular movements intact.     Conjunctiva/sclera: Conjunctivae normal.     Right eye: Right conjunctiva is not injected. No exudate or hemorrhage.    Left eye: Left conjunctiva is not injected. No exudate or hemorrhage.    Pupils: Pupils are equal, round, and reactive to light.  Neck:     Thyroid: No thyroid mass or thyromegaly.     Vascular: Normal carotid pulses. No carotid bruit, hepatojugular reflux or JVD.     Trachea: Trachea and phonation normal. No tracheal tenderness or tracheal deviation.     Meningeal: Brudzinski's sign and Kernig's sign absent.  Cardiovascular:     Rate and Rhythm: Normal rate and regular rhythm.     Pulses: Normal pulses.          Radial pulses are 2+ on the right side and 2+ on the left side.       Dorsalis pedis pulses are 2+ on the right side and 2+ on the left side.       Posterior tibial pulses are 2+ on the right side and 2+ on the left side.     Heart sounds: Normal heart sounds, S1 normal and S2 normal. Heart sounds not distant. No murmur heard.  No systolic murmur is present.  No diastolic murmur is present. No friction rub. No gallop.   Pulmonary:     Effort: Pulmonary effort is normal. No tachypnea, bradypnea, accessory muscle usage or respiratory distress. She is not intubated.     Breath sounds: Normal breath sounds. No stridor. No wheezing, rhonchi or  rales.  Chest:     Chest wall: No tenderness.  Breasts:     Right: No supraclavicular adenopathy.     Left: No supraclavicular adenopathy.    Abdominal:     General: Bowel sounds are normal. There is no distension or abdominal bruit.     Palpations: Abdomen is soft. There is no shifting dullness, fluid wave, hepatomegaly, splenomegaly, mass or pulsatile mass.     Tenderness: There is no abdominal tenderness. There is no right CVA tenderness, left CVA tenderness, guarding or rebound.     Hernia: No hernia is present.  Musculoskeletal:        General: No tenderness or deformity. Normal range of motion.     Cervical back: Full passive range of motion without pain, normal range of motion and neck supple. No edema, erythema or rigidity. No spinous process tenderness or muscular tenderness. Normal range of motion.     Right lower leg: No edema.     Left lower leg: No edema.  Lymphadenopathy:     Head:     Right side of head: No submental, submandibular, tonsillar, preauricular, posterior auricular or occipital adenopathy.     Left side of head: No submental, submandibular, tonsillar, preauricular, posterior auricular or occipital adenopathy.     Cervical: No cervical adenopathy.     Right cervical: No superficial, deep or posterior cervical adenopathy.    Left cervical: No superficial, deep or posterior cervical adenopathy.     Upper Body:     Right upper body: No supraclavicular or pectoral adenopathy.     Left upper body:  No supraclavicular or pectoral adenopathy.  Skin:    General: Skin is warm and dry.     Coloration: Skin is not pale.     Findings: No abrasion, bruising, burn, ecchymosis, erythema, lesion, petechiae or rash.     Nails: There is no clubbing.  Neurological:     Mental Status: She is alert and oriented to person, place, and time.     GCS: GCS eye subscore is 4. GCS verbal subscore is 5. GCS motor subscore is 6.     Cranial Nerves: Cranial nerves are intact. No cranial  nerve deficit.     Sensory: Sensation is intact. No sensory deficit.     Motor: Motor function is intact. No weakness, tremor, atrophy, abnormal muscle tone or seizure activity.     Coordination: Coordination is intact. Coordination normal. Finger-Nose-Finger Test normal.     Gait: Gait is intact. Gait normal.     Deep Tendon Reflexes: Reflexes are normal and symmetric. Reflexes normal. Babinski sign absent on the right side. Babinski sign absent on the left side.     Reflex Scores:      Tricep reflexes are 2+ on the right side and 2+ on the left side.      Patellar reflexes are 2+ on the right side and 2+ on the left side. Psychiatric:        Mood and Affect: Mood normal.        Speech: Speech normal.        Behavior: Behavior normal.        Thought Content: Thought content normal.        Judgment: Judgment normal.      No results found for any visits on 05/31/20.  Assessment & Plan     1. Syncope, unspecified syncope type Neurological exam within normal limits.  Labs today.  - CBC with Differential/Platelet - Comprehensive Metabolic Panel (CMET) - TSH - Brain natriuretic peptide - EKG 12-Lead reviewed and also sent to Cardiologist Dr. Saunders Revel for review given her significant cardiac history. She has no cardiac or respiratory symptoms as office visit or does not endorse any prior to this episode.   No significant changes since last EKG. Sinus rhythm.  - HgB A1c  2. Pain Suspect reaction to pain of stumping toes very hard and patient reports no other symptoms.  Advised not to drive and have follow up in 2- 3 weeks with Dr. Caryn Section.  Monitor blood pressure, it has been low end normal per last 3 office visits. She has a stable weight. Hydration.  She does not endorse any shortness of breath or chest pain.   Red Flags discussed. The patient was given clear instructions to go to ER or return to medical center if any red flags develop, symptoms do not improve, worsen or new problems  develop. They verbalized understanding.   Return in about 3 weeks (around 06/21/2020), or if symptoms worsen or fail to improve, for at any time for any worsening symptoms, Go to Emergency room/ urgent care if worse.      The entirety of the information documented in the History of Present Illness, Review of Systems and Physical Exam were personally obtained by me. Portions of this information were initially documented by the CMA and reviewed by me for thoroughness and accuracy.      Marcille Buffy, [place] [phone] (phone) [phone] (fax)  [place]

## 2020-06-02 DIAGNOSIS — R55 Syncope and collapse: Secondary | ICD-10-CM | POA: Diagnosis not present

## 2020-06-03 LAB — BRAIN NATRIURETIC PEPTIDE: BNP: 32.1 pg/mL (ref 0.0–100.0)

## 2020-06-03 LAB — CBC WITH DIFFERENTIAL/PLATELET
Basophils Absolute: 0 10*3/uL (ref 0.0–0.2)
Basos: 1 %
EOS (ABSOLUTE): 0.3 10*3/uL (ref 0.0–0.4)
Eos: 3 %
Hematocrit: 45 % (ref 34.0–46.6)
Hemoglobin: 15.2 g/dL (ref 11.1–15.9)
Immature Grans (Abs): 0 10*3/uL (ref 0.0–0.1)
Immature Granulocytes: 0 %
Lymphocytes Absolute: 3 10*3/uL (ref 0.7–3.1)
Lymphs: 35 %
MCH: 30.5 pg (ref 26.6–33.0)
MCHC: 33.8 g/dL (ref 31.5–35.7)
MCV: 90 fL (ref 79–97)
Monocytes Absolute: 0.4 10*3/uL (ref 0.1–0.9)
Monocytes: 4 %
Neutrophils Absolute: 5 10*3/uL (ref 1.4–7.0)
Neutrophils: 57 %
Platelets: 208 10*3/uL (ref 150–450)
RBC: 4.98 x10E6/uL (ref 3.77–5.28)
RDW: 13.5 % (ref 11.7–15.4)
WBC: 8.7 10*3/uL (ref 3.4–10.8)

## 2020-06-03 LAB — COMPREHENSIVE METABOLIC PANEL
ALT: 24 IU/L (ref 0–32)
AST: 29 IU/L (ref 0–40)
Albumin/Globulin Ratio: 1.4 (ref 1.2–2.2)
Albumin: 4.3 g/dL (ref 3.8–4.8)
Alkaline Phosphatase: 170 IU/L — ABNORMAL HIGH (ref 44–121)
BUN/Creatinine Ratio: 9 — ABNORMAL LOW (ref 12–28)
BUN: 7 mg/dL — ABNORMAL LOW (ref 8–27)
Bilirubin Total: 0.3 mg/dL (ref 0.0–1.2)
CO2: 19 mmol/L — ABNORMAL LOW (ref 20–29)
Calcium: 9.8 mg/dL (ref 8.7–10.3)
Chloride: 105 mmol/L (ref 96–106)
Creatinine, Ser: 0.8 mg/dL (ref 0.57–1.00)
Globulin, Total: 3.1 g/dL (ref 1.5–4.5)
Glucose: 123 mg/dL — ABNORMAL HIGH (ref 65–99)
Potassium: 4.2 mmol/L (ref 3.5–5.2)
Sodium: 142 mmol/L (ref 134–144)
Total Protein: 7.4 g/dL (ref 6.0–8.5)
eGFR: 81 mL/min/{1.73_m2} (ref >59)

## 2020-06-03 LAB — TSH: TSH: 1.66 u[IU]/mL (ref 0.450–4.500)

## 2020-06-03 LAB — HEMOGLOBIN A1C
Est. average glucose Bld gHb Est-mCnc: 134 mg/dL
Hgb A1c MFr Bld: 6.3 % — ABNORMAL HIGH (ref 4.8–5.6)

## 2020-06-03 NOTE — Progress Notes (Signed)
CBC within normal limits.  CMP ok, glucose is elevated not fasting. Alkaline phosphatase is elevated- she was not fasting.Recheck in 2 months CMP. Hemoglobin A1C is stable.   TSH for thyroid within normal limits. BNP is within normal limits.   Keep advised follow up with Caryn Section Kirstie Peri, MD PCP/

## 2020-06-07 ENCOUNTER — Ambulatory Visit (INDEPENDENT_AMBULATORY_CARE_PROVIDER_SITE_OTHER): Payer: BC Managed Care – PPO

## 2020-06-07 ENCOUNTER — Encounter: Payer: Self-pay | Admitting: Family

## 2020-06-07 ENCOUNTER — Ambulatory Visit (INDEPENDENT_AMBULATORY_CARE_PROVIDER_SITE_OTHER): Payer: BC Managed Care – PPO | Admitting: Family

## 2020-06-07 ENCOUNTER — Other Ambulatory Visit: Payer: Self-pay

## 2020-06-07 VITALS — BP 120/68 | HR 79 | Ht 63.0 in | Wt 145.0 lb

## 2020-06-07 DIAGNOSIS — I739 Peripheral vascular disease, unspecified: Secondary | ICD-10-CM

## 2020-06-07 DIAGNOSIS — E785 Hyperlipidemia, unspecified: Secondary | ICD-10-CM

## 2020-06-07 DIAGNOSIS — R55 Syncope and collapse: Secondary | ICD-10-CM

## 2020-06-07 DIAGNOSIS — I5032 Chronic diastolic (congestive) heart failure: Secondary | ICD-10-CM

## 2020-06-07 DIAGNOSIS — I2510 Atherosclerotic heart disease of native coronary artery without angina pectoris: Secondary | ICD-10-CM | POA: Diagnosis not present

## 2020-06-07 DIAGNOSIS — E1151 Type 2 diabetes mellitus with diabetic peripheral angiopathy without gangrene: Secondary | ICD-10-CM

## 2020-06-07 NOTE — Patient Instructions (Addendum)
Medication Instructions:  No medication changes today.   *If you need a refill on your cardiac medications before your next appointment, please call your pharmacy*  Lab Work: None ordered today. You lab work with your primary care provider last week looked good.  Testing/Procedures: Your EKG today was stable compared to previous.   Your physician has recommended that you wear a LIVE Zio AT monitor for 2 weeks. This monitor is a medical device that records the heart's electrical activity. Doctors most often use these monitors to diagnose arrhythmias. Arrhythmias are problems with the speed or rhythm of the heartbeat. The monitor is a small device applied to your chest. You can wear one while you do your normal daily activities. While wearing this monitor if you have any symptoms to push the button and record what you felt. Once you have worn this monitor for the period of time provider prescribed (Usually 14 days), you will return the monitor device in the postage paid box. Once it is returned they will download the data collected and provide [name] with a report which the provider will then review and we will call you with those results. Important tips:  1. Avoid showering during the first 24 hours of wearing the monitor. 2. Avoid excessive sweating to help maximize wear time. 3. Do not submerge the device, no hot tubs, and no swimming pools. 4. Keep any lotions or oils away from the patch. 5. After 24 hours you may shower with the patch on. Take brief showers with your back facing the shower head.  6. Do not remove patch once it has been placed because that will interrupt data and decrease adhesive wear time. 7. Push the button when you have any symptoms and write down what you were feeling. 8. Once you have completed wearing your monitor, remove and place into box which has postage paid and place in your outgoing mailbox.  9. If for some reason you have misplaced your box then call our office and  we can provide another box and/or mail it off for you.       Follow-Up: At [facility], you and your health needs are our priority.  As part of our continuing mission to provide you with exceptional heart care, we have created designated Provider Care Teams.  These Care Teams include your primary Cardiologist (physician) and Advanced Practice Providers (APPs -  Physician Assistants and Nurse Practitioners) who all work together to provide you with the care you need, when you need it.  We recommend signing up for the patient portal called "MyChart".  Sign up information is provided on this After Visit Summary.  MyChart is used to connect with patients for Virtual Visits (Telemedicine).  Patients are able to view lab/test results, encounter notes, upcoming appointments, etc.  Non-urgent messages can be sent to your provider as well.   To learn more about what you can do with MyChart, go to [email or web address].    Your next appointment:   2 month(s)  The format for your next appointment:   In Person  Provider:   You may see Nelva Bush, MD or one of the following Advanced Practice Providers on your designated Care Team:    Murray Hodgkins, NP  Christell Faith, PA-C  Marrianne Mood, PA-C  Cadence Bangor, [place]  Laurann Montana, NP  Other Instructions   Recommend increasing your fluid intake to prevent your blood pressure from dropping when you change positions.  Orthostatic Hypotension Blood pressure is a measurement of  how strongly, or weakly, your blood is pressing against the walls of your arteries. Orthostatic hypotension is a sudden drop in blood pressure that happens when you quickly change positions, such as when you get up from sitting or lying down. Arteries are blood vessels that carry blood from your heart throughout your body. When blood pressure is too low, you may not get enough blood to your brain or to the rest of your organs. This can cause weakness,  light-headedness, rapid heartbeat, and fainting. This can last for just a few seconds or for up to a few minutes. Orthostatic hypotension is usually not a serious problem. However, if it happens frequently or gets worse, it may be a sign of something more serious. What are the causes? This condition may be caused by:  Sudden changes in posture, such as standing up quickly after you have been sitting or lying down.  Blood loss.  Loss of body fluids (dehydration).  Heart problems.  Hormone (endocrine) problems.  Pregnancy.  Severe infection.  Lack of certain nutrients.  Severe allergic reactions (anaphylaxis).  Certain medicines, such as blood pressure medicine or medicines that make the body lose excess fluids (diuretics). Sometimes, this condition can be caused by not taking medicine as directed, such as taking too much of a certain medicine. What increases the risk? The following factors may make you more likely to develop this condition:  Age. Risk increases as you get older.  Conditions that affect the heart or the central nervous system.  Taking certain medicines, such as blood pressure medicine or diuretics.  Being pregnant. What are the signs or symptoms? Symptoms of this condition may include:  Weakness.  Light-headedness.  Dizziness.  Blurred vision.  Fatigue.  Rapid heartbeat.  Fainting, in severe cases. How is this treated? This condition may be treated by:  Changing your diet. This may involve eating more salt (sodium) or drinking more water.  Taking medicines to raise your blood pressure.  Changing the dosage of certain medicines you are taking that might be lowering your blood pressure.  Wearing compression stockings. These stockings help to prevent blood clots and reduce swelling in your legs. In some cases, you may need to go to the hospital for:  Fluid replacement. This means you will receive fluids through an IV.  Blood replacement. This  means you will receive donated blood through an IV (transfusion).  Treating an infection or heart problems, if this applies.  Monitoring. You may need to be monitored while medicines that you are taking wear off. Follow these instructions at home: Eating and drinking  Drink enough fluid to keep your urine pale yellow.  Eat a healthy diet, and follow instructions from your health care provider about eating or drinking restrictions. A healthy diet includes: ? Fresh fruits and vegetables. ? Whole grains. ? Lean meats. ? Low-fat dairy products.  Eat extra salt only as directed. Do not add extra salt to your diet unless your health care provider told you to do that.  Eat frequent, small meals.  Avoid standing up suddenly after eating.   Medicines  Take over-the-counter and prescription medicines only as told by your health care provider. ? Follow instructions from your health care provider about changing the dosage of your current medicines, if this applies. ? Do not stop or adjust any of your medicines on your own. General instructions  Wear compression stockings as told by your health care provider.  Get up slowly from lying down or sitting positions.  This gives your blood pressure a chance to adjust.  Avoid hot showers and excessive heat as directed by your health care provider.  Return to your normal activities as told by your health care provider. Ask your health care provider what activities are safe for you.  Do not use any products that contain nicotine or tobacco, such as cigarettes, e-cigarettes, and chewing tobacco. If you need help quitting, ask your health care provider.  Keep all follow-up visits as told by your health care provider. This is important.   Contact a health care provider if you:  Vomit.  Have diarrhea.  Have a fever for more than 2-3 days.  Feel more thirsty than usual.  Feel weak and tired. Get help right away if you:  Have chest  pain.  Have a fast or irregular heartbeat.  Develop numbness in any part of your body.  Cannot move your arms or your legs.  Have trouble speaking.  Become sweaty or feel light-headed.  Faint.  Feel short of breath.  Have trouble staying awake.  Feel confused. Summary  Orthostatic hypotension is a sudden drop in blood pressure that happens when you quickly change positions.  Orthostatic hypotension is usually not a serious problem.  It is diagnosed by having your blood pressure taken lying down, sitting, and then standing.  It may be treated by changing your diet or adjusting your medicines. This information is not intended to replace advice given to you by your health care provider. Make sure you discuss any questions you have with your health care provider. Document Revised: 09/12/2017 Document Reviewed: 09/12/2017 Elsevier Patient Education  [place].

## 2020-06-07 NOTE — Progress Notes (Signed)
Office Visit    Patient Name: Katelyn Cooper Date of Encounter: 06/07/2020  PCP:  [name], MD   [place]  Cardiologist:  Nelva Bush, MD  Advanced Practice Provider:  No care team member to display Electrophysiologist:  None   Chief Complaint    Katelyn Cooper is a 65 y.o. female with a hx of nonobstructive coronary artery disease, PAD s/p left iliac stent, chronic HFpEF, diet-controlled DM2, thoracic outlet obstruction s/p bilateral rib resection (4696), GERD complicated by esophageal stricture, hyperlipidemia, anxiety/depression presents today for syncopal episode  Past Medical History    Past Medical History:  Diagnosis Date  . (HFpEF) heart failure with preserved ejection fraction ([place])    a. 12/2015 Echo: EF 55-60%, nl RV size/fxn, no significant valvular abnormalities; b. 10/2017 Echo: EF 50-55%, antsept, ant HK. Gr2 DD. Mild MR. Nl RV fxn. PASP 99mmHg.  [name] Anxiety   . Basal cell carcinoma 08/08/2017   L nose above mid nasal alar groove  . CHF (congestive heart failure) ([place])   . Depression   . Diabetes type 2, controlled ([place])    diet-controlled  . Esophageal stricture   . GERD (gastroesophageal reflux disease)   . Hiatal hernia   . History of chicken pox   . Hyperlipidemia   . Hypertension   . Internal hemorrhoids   . Non-obstructive CAD (coronary artery disease)    a. 12/2015 MV: apical ant and apical reversible defect. EF 55-65%; b. 01/2016 Cath: LM nl, LAD nl, SP1 40ost, LCX nl, OM1 30, RCA 30p/m, EDP 15-72mmHg; c. 10/2017 Cath: LM nl, LAD nl, SP1 30ost, LCX nl, OM1 30, RCA 30p/m. EF 55%.  . Obstructive sleep apnea   . PAD (peripheral artery disease) ([place])    a. 1990's s/p prior LCIA stenting; b. 12/2015 ABI: R 1.05, L 1.03.  . Panic disorder    Past Surgical History:  Procedure Laterality Date  . [place]   stent placement; ? left iliac artery intervention  . APPENDECTOMY  1998  . BASAL CELL CARCINOMA  EXCISION    . CARDIAC CATHETERIZATION N/A 01/12/2016   Procedure: Left Heart Cath and Coronary Angiography;  Surgeon: Nelva Bush, MD;  Location: [facility];  Service: Cardiovascular;  Laterality: N/A;  . COLONOSCOPY  07/17/2019  . FOOT SURGERY     Right  . pap smear  03/2010   Done by [name], normal  . REFRACTIVE SURGERY     right  . RIB RESECTION  [id number]  . RIGHT/LEFT HEART CATH AND CORONARY ANGIOGRAPHY N/A 11/08/2017   Procedure: RIGHT/LEFT HEART CATH AND CORONARY ANGIOGRAPHY;  Surgeon: Wellington Hampshire, MD;  Location: [facility];  Service: Cardiovascular;  Laterality: N/A;  . SHOULDER SURGERY  2005   left  . UPPER GASTROINTESTINAL ENDOSCOPY  07/17/2019  . UPPER GI ENDOSCOPY  09/19/2011   Stricture at GE junction, dilated. Mild gastritis and duodenitis. Small hiatal hernia    Allergies  Allergies  Allergen Reactions  . Lovastatin     depression  . Paroxetine Hcl     itching  . Lamotrigine Rash    History of Present Illness    Katelyn Cooper is a 65 y.o. female with a hx of nonobstructive coronary artery disease, PAD s/p left iliac stent, chronic HFpEF, diet-controlled DM2, thoracic outlet obstruction s/p bilateral rib resection (2952), GERD complicated by esophageal stricture, hyperlipidemia, anxiety/depression last seen by Dr. Saunders Revel 03/16/2020.  Most recent  echo January 2021 LVEF 60 to 65%, elevated left atrial pressure, grade 1 diastolic dysfunction, no low motion abnormalities, trivial MR.  Clinic visit June 2021 she reported stable dyspnea on exertion, atorvastatin was increased to 80 mg daily for LDL target less than 70.  After 8 weeks of therapy her LDL was at 53.  The clinic visit in December reported some improvement in her dyspnea.  No chest pain, palpitations, lightheadedness, edema.  No changes were made at that time.  She presents today for follow-up.  Reports syncopal episode approximately 10 days ago.  She stubbed her toe with  significant pain and loss consciousness for less than 1 minute per her husband who witnessed. CBG was 130s after the incident.  She was seen by primary care for the same incident last week with CBC, CMP, TSH, BNP which were unremarkable.  Tells me sometimes she gets up and feels a bit lightheaded. No near syncope and no episodes of syncope other than that previously described. She does not drink much throughout the day, only one 8oz glass of water per day and two or three 12 oz pepsi per day. We discussed that caffeine is a diuretic. She remains very active walking at [facility] 3 times per week with a friend. They have worked their way up to3 miles  Orthostatic vital signs were performed, as listed below.  Blood pressure does drop from lying to sitting and sitting to standing but heart rate remains stable.  Orthostatic VS for the past 24 hrs (Last 3 readings):  BP- Lying Pulse- Lying BP- Sitting Pulse- Sitting BP- Standing at 0 minutes Pulse- Standing at 0 minutes BP- Standing at 3 minutes Pulse- Standing at 3 minutes  06/07/20 0859 127/72 80 116/65 80 110/63 80 120/65 80    EKGs/Labs/Other Studies Reviewed:   The following studies were reviewed today:  Cath/PCI:  L/RHC (11/08/2017): LMCA with minimal diffuse disease.  LAD normal.  30% stenosis at first septal perforator.  LCx with mild luminal irregularities.  OM1 with 30% disease.  RCA with sequential 30% proximal and mid stenoses.  Upper normal filling pressures and normal pulmonary artery pressure with moderately reduced Fick cardiac output (CO 3.1/CI 1.8).  LHC (01/12/16): LMCA and LAD normal. There is a 40% stenosis in the proximal aspect of the large first septal perforator. There is 30% OM1 stenosis. 30% proximal and mid RCA lesions are also present. LVEDP mildly elevated at 15-20 mmHg.    Non-Invasive Evaluation(s):  TTE (04/14/2019): Normal LV size and wall thickness with LVEF 60-65%.  There is grade 1 diastolic dysfunction with  elevated filling pressures.  Normal RV size and function.  No significant valvular abnormality.  Normal CVP.  Unable to assess PA pressure.  Pharmacologic MPI (03/30/2019): Low risk study withotu ischemia or scar.  Coronary artery calcification noted.  LVEF > 65%.  TTE (11/06/2017): Normal LV size and wall thickness.  LVEF 50-55% with hypokinesis of the anteroseptal and anterior myocardium.  Grade 2 diastolic dysfunction with elevated filling pressures.  Mild MR.  Normal RV size and function.  Mild pulmonary hypertension (PASP 35 mmHg).  Pharmacologic myocardial perfusion stress test (12/23/15): Small, mild area of reversible defect involving the apical anterior and apical segments that could reflect ischemia or breast attenuation. LVEF 55-65%. Borderline TID was noted.  Transthoracic echocardiogram (12/28/15): Normal LV systolic and diastolic function (EF 63-14%). No significant valvular abnormalities. Normal RV size and function.  Aortoiliac and lower extremity Dopplers: Patent stent in the left common  iliac artery. No significant abnormalities. ABI right 1.05, left 1.03.  EKG:  EKG is  ordered today.  The ekg ordered today demonstrates NSR 79 bpm with incomplete left bundle branch block and no acute ST/T wave changes.  Recent Labs: 06/02/2020: ALT 24; BNP 32.1; BUN 7; Creatinine, Ser 0.80; Hemoglobin 15.2; Platelets 208; Potassium 4.2; Sodium 142; TSH 1.660  Recent Lipid Panel    Component Value Date/Time   CHOL 138 03/14/2020 0836   CHOL 160 04/17/2019 1029   TRIG 203 (H) 03/14/2020 0836   HDL 44 03/14/2020 0836   HDL 55 04/17/2019 1029   CHOLHDL 3.1 03/14/2020 0836   VLDL 41 (H) 03/14/2020 0836   LDLCALC 53 03/14/2020 0836   LDLCALC 76 04/17/2019 1029    Home Medications   Current Meds  Medication Sig  . acetaminophen (TYLENOL) 500 MG tablet Take 500 mg by mouth every 6 (six) hours as needed.  [name] aspirin EC 81 MG tablet Take 1 tablet (81 mg total) by mouth daily.  [name] atorvastatin  (LIPITOR) 80 MG tablet TAKE 1 TABLET DAILY  . b complex vitamins capsule Take 1 capsule by mouth daily.  . Cholecalciferol (VITAMIN D3) 5000 units TABS Take 1 tablet by mouth daily.   . Ferrous Sulfate (IRON PO) Take by mouth. Taking 1 capsule daily  . furosemide (LASIX) 20 MG tablet TAKE 1 TABLET DAILY  . glucose blood test strip ONETOUCH ULTRA MINI STRIPS AND LANCETS. Use one daily to check blood sugar.  . KLOR-CON M20 20 MEQ tablet TAKE 2 TABLETS TWICE A DAY (NEEDS APPOINTMENT FOR FURTHER REFILLS)  . lansoprazole (PREVACID) 15 MG capsule Take 15 mg by mouth daily at 6 (six) AM.  . metFORMIN (GLUCOPHAGE-XR) 500 MG 24 hr tablet TAKE 1 TABLET BY MOUTH EVERY EVENING  . metoprolol tartrate (LOPRESSOR) 25 MG tablet TAKE 1 TABLET TWICE A DAY  . Multiple Vitamins-Minerals (MULTI-VITAMIN GUMMIES PO) Take by mouth. Taking 1 daily  . QUEtiapine (SEROQUEL) 100 MG tablet Take 1 tablet (100 mg total) by mouth at bedtime.  [name] venlafaxine XR (EFFEXOR-XR) 75 MG 24 hr capsule Take 1 capsule (75 mg total) by mouth daily with breakfast.  . zaleplon (SONATA) 10 MG capsule Take 1 capsule (10 mg total) by mouth at bedtime as needed for sleep.     Review of Systems   Review of Systems  Constitutional: Negative for chills, fever and malaise/fatigue.  Cardiovascular: Positive for syncope. Negative for chest pain, dyspnea on exertion, irregular heartbeat, leg swelling, near-syncope, orthopnea and palpitations.  Respiratory: Negative for cough, shortness of breath and wheezing.   Gastrointestinal: Negative for melena, nausea and vomiting.  Genitourinary: Negative for hematuria.  Neurological: Positive for light-headedness. Negative for dizziness and weakness.   All other systems reviewed and are otherwise negative except as noted above.  Physical Exam    VS:  Pulse 79   Ht 5\' 3"  (1.6 m)   Wt 145 lb (65.8 kg)   SpO2 96%   BMI 25.69 kg/m  , BMI Body mass index is 25.69 kg/m.  Wt Readings from Last 3  Encounters:  06/07/20 145 lb (65.8 kg)  05/31/20 143 lb 12.8 oz (65.2 kg)  04/27/20 143 lb (64.9 kg)    GEN: Well nourished, well developed, in no acute distress. HEENT: normal. Neck: Supple, no JVD, carotid bruits, or masses. Cardiac: RRR, no murmurs, rubs, or gallops. No clubbing, cyanosis, edema.  Radials/DP/PT 2+ and equal bilaterally.  Respiratory:  Respirations regular and unlabored, clear to auscultation  bilaterally. GI: Soft, nontender, nondistended. MS: No deformity or atrophy. Skin: Warm and dry, no rash. Neuro:  Strength and sensation are intact. Psych: Normal affect.  Assessment & Plan    1. Syncope -isolated episode in the setting of stopping her after standing up quickly but no prodromal symptoms and loss of consciousness for less than 1 minute witnessed by her husband.  Lab work with PCP 06/02/2020 with normal liver function, electrolytes, BNP 32.1, hemoglobin 15.2, TSH 1.66.  In the setting left bundle branch block I do believe we need to exclude significant pauses or heart block.  14-day live telemetry monitor placed today.  She had echocardiogram 04/14/2019 with LVEF 60 to 34%, grade 1 diastolic dysfunction, no wall motion normalities, RV normal size and function, no significant valvular abnormalities.  Will defer repeat echocardiogram but if she has recurrent syncope would have low threshold to repeat.  Anticipate some element of orthostatic hypotension also contributory she does have significant blood pressure drop with position changes today.  She was encouraged to increase hydration, reduce caffeine intake, make position changes slowly.  2. Chronic HFpEF- Stable NYHA II symptoms. Walking 3 miles three times per week for exercise. Euvolemic and well compensated on exam.  Continue Lasix 20mg  daily. If hypotension persists, may need to consider transition to QOD dosing.   3. Coronary disease-nonobstructive coronary artery disease by 2 prior catheterizations.  EKG today with no  acute ST/T wave changes.  No anginal symptoms.  No indication for ischemic evaluation at this time.  GDMT with aspirin, beta-blocker, statin.  4. PAD-s/p iliac stenting many years ago. No signs of worsening claudication continue aspirin, statin.  5. Hyperlipidemia- Continue Atorvastatin 80mg  QD.   6. DM2- Continue to follow with PCP. CBG after syncopal episode of 130s. Low suspicion for hypoglycemic event.  Disposition: Follow up in 2 month(s) with Dr. Saunders Revel or APP  Signed, Loel Dubonnet, NP 06/07/2020, 9:04 AM [place]

## 2020-06-09 ENCOUNTER — Telehealth: Payer: Self-pay | Admitting: Internal Medicine

## 2020-06-09 NOTE — Telephone Encounter (Signed)
We do not do anything with Zio monitors.

## 2020-06-09 NOTE — Telephone Encounter (Signed)
Patient calling - saw C. Walker on 03/08  States irhythm says her insurance will not pay for ZIO monitor without a prior authorization  Please review and advise

## 2020-07-06 ENCOUNTER — Other Ambulatory Visit: Payer: Self-pay

## 2020-07-06 ENCOUNTER — Encounter (HOSPITAL_COMMUNITY): Payer: Self-pay | Admitting: Psychiatry

## 2020-07-06 ENCOUNTER — Telehealth (INDEPENDENT_AMBULATORY_CARE_PROVIDER_SITE_OTHER): Payer: BC Managed Care – PPO | Admitting: Psychiatry

## 2020-07-06 DIAGNOSIS — F3170 Bipolar disorder, currently in remission, most recent episode unspecified: Secondary | ICD-10-CM

## 2020-07-06 DIAGNOSIS — F4310 Post-traumatic stress disorder, unspecified: Secondary | ICD-10-CM

## 2020-07-06 MED ORDER — QUETIAPINE FUMARATE 100 MG PO TABS
100.0000 mg | ORAL_TABLET | Freq: Every day | ORAL | 1 refills | Status: DC
Start: 2020-07-06 — End: 2020-10-05

## 2020-07-06 MED ORDER — VENLAFAXINE HCL ER 75 MG PO CP24: 75.0000 mg | ORAL_CAPSULE | Freq: Every day | ORAL | 1 refills | Status: DC

## 2020-07-06 MED ORDER — ZALEPLON 10 MG PO CAPS: 10.0000 mg | ORAL_CAPSULE | Freq: Every evening | ORAL | 2 refills | Status: DC | PRN

## 2020-07-06 NOTE — Progress Notes (Signed)
[facility] MD OP Progress Note  Virtual Visit via Video Note  I connected with Katelyn Cooper on 07/06/20 at 11:00 AM EDT by a video enabled telemedicine application and verified that I am speaking with the correct person using two identifiers.  Location: Patient: Home Provider: Clinic   I discussed the limitations of evaluation and management by telemedicine and the availability of in person appointments. The patient expressed understanding and agreed to proceed.  I provided 16 minutes of non-face-to-face time during this encounter.    07/06/2020 11:02 AM Katelyn Cooper  MRN:  [id number]  Chief Complaint:  " I am fine."  HPI: Patient follows she is doing well.  She stated that she is happy with the weather improving and the pandemic cases going down she had a family members have been able to get out of the house more.  She is still going for her walks around the block with a friend. She stated that her medicines are helping however she has noticed that it takes longer for the Sonata medication to kick in.  She stated that usually in the past will take about 30 minutes but now it since started taking about an hour.  However once she is asleep she is able to stay asleep throughout the night. Writer advised her if the medicine is helping her stay asleep and maybe she can just start taking the medicine an hour earlier for her bedtime and go from there.  Patient was agreeable to trying this. She denied any other concerns today.    Visit Diagnosis:    ICD-10-CM   1. Bipolar disorder in remission ([place])  F31.70   2. Post traumatic stress disorder (PTSD)  F43.10     Past Psychiatric History: PTSD, Bipolar d/o  Past Medical History:  Past Medical History:  Diagnosis Date  . (HFpEF) heart failure with preserved ejection fraction ([place])    a. 12/2015 Echo: EF 55-60%, nl RV size/fxn, no significant valvular abnormalities; b. 10/2017 Echo: EF 50-55%, antsept, ant HK. Gr2 DD. Mild MR. Nl RV fxn. PASP  55mmHg.  [name] Anxiety   . Basal cell carcinoma 08/08/2017   L nose above mid nasal alar groove  . CHF (congestive heart failure) ([place])   . Depression   . Diabetes type 2, controlled ([place])    diet-controlled  . Esophageal stricture   . GERD (gastroesophageal reflux disease)   . Hiatal hernia   . History of chicken pox   . Hyperlipidemia   . Hypertension   . Internal hemorrhoids   . Non-obstructive CAD (coronary artery disease)    a. 12/2015 MV: apical ant and apical reversible defect. EF 55-65%; b. 01/2016 Cath: LM nl, LAD nl, SP1 40ost, LCX nl, OM1 30, RCA 30p/m, EDP 15-22mmHg; c. 10/2017 Cath: LM nl, LAD nl, SP1 30ost, LCX nl, OM1 30, RCA 30p/m. EF 55%.  . Obstructive sleep apnea   . PAD (peripheral artery disease) ([place])    a. 1990's s/p prior LCIA stenting; b. 12/2015 ABI: R 1.05, L 1.03.  . Panic disorder     Past Surgical History:  Procedure Laterality Date  . [place]   stent placement; ? left iliac artery intervention  . APPENDECTOMY  1998  . BASAL CELL CARCINOMA EXCISION    . CARDIAC CATHETERIZATION N/A 01/12/2016   Procedure: Left Heart Cath and Coronary Angiography;  Surgeon: Nelva Bush, MD;  Location: [facility];  Service: Cardiovascular;  Laterality: N/A;  . COLONOSCOPY  07/17/2019  .  FOOT SURGERY     Right  . pap smear  03/2010   Done by [name], normal  . REFRACTIVE SURGERY     right  . RIB RESECTION  [id number]  . RIGHT/LEFT HEART CATH AND CORONARY ANGIOGRAPHY N/A 11/08/2017   Procedure: RIGHT/LEFT HEART CATH AND CORONARY ANGIOGRAPHY;  Surgeon: Wellington Hampshire, MD;  Location: [facility];  Service: Cardiovascular;  Laterality: N/A;  . SHOULDER SURGERY  2005   left  . UPPER GASTROINTESTINAL ENDOSCOPY  07/17/2019  . UPPER GI ENDOSCOPY  09/19/2011   Stricture at GE junction, dilated. Mild gastritis and duodenitis. Small hiatal hernia    Family Psychiatric History: sister- bipolar d/o,mom - depression  Family History:   Family History  Problem Relation Age of Onset  . Lung cancer Mother        was a smoker  . Emphysema Mother   . Cancer Mother        Lung  . Alcohol abuse Mother   . Depression Mother   . Heart disease Mother   . Drug abuse Sister   . Breast cancer Sister        x 2  (30&50)  . Emphysema Sister   . Bipolar disorder Sister   . Alcohol abuse Other   . Depression Other   . Bipolar disorder Other   . Heart disease Other   . Vision loss Other   . Alcohol abuse Father   . Mood Disorder Father   . Heart disease Maternal Grandmother   . Stroke Maternal Grandmother   . Esophageal cancer Maternal Aunt   . Colon cancer Neg Hx   . Colon polyps Neg Hx   . Rectal cancer Neg Hx   . Stomach cancer Neg Hx     Social History:  Social History   Socioeconomic History  . Marital status: Married    Spouse name: mark  . Number of children: 0  . Years of education: Not on file  . Highest education level: High school graduate  Occupational History  . Occupation: computer-retired    Employer: [employer]  Tobacco Use  . Smoking status: Current Every Day Smoker    Packs/day: 0.00    Years: 40.00    Pack years: 0.00    Types: Cigarettes    Start date: 03/01/1975    Last attempt to quit: 08/11/2017    Years since quitting: 2.9  . Smokeless tobacco: Never Used  . Tobacco comment: smokes 10 cigarettes per day  Vaping Use  . Vaping Use: Former  Substance and Sexual Activity  . Alcohol use: No    Alcohol/week: 0.0 standard drinks  . Drug use: No  . Sexual activity: Not Currently  Other Topics Concern  . Not on file  Social History Narrative  . Not on file   Social Determinants of Health   Financial Resource Strain: Not on file  Food Insecurity: Not on file  Transportation Needs: Not on file  Physical Activity: Not on file  Stress: Not on file  Social Connections: Not on file    Allergies:  Allergies  Allergen Reactions  . Lovastatin     depression  . Paroxetine Hcl      itching  . Lamotrigine Rash    Metabolic Disorder Labs: Lab Results  Component Value Date   HGBA1C 6.3 (H) 06/02/2020   No results found for: PROLACTIN Lab Results  Component Value Date   CHOL 138 03/14/2020   TRIG 203 (H)  03/14/2020   HDL 44 03/14/2020   CHOLHDL 3.1 03/14/2020   VLDL 41 (H) 03/14/2020   LDLCALC 53 03/14/2020   LDLCALC 76 04/17/2019   Lab Results  Component Value Date   TSH 1.660 06/02/2020   TSH 1.550 04/17/2019    Therapeutic Level Labs: No results found for: LITHIUM No results found for: VALPROATE No components found for:  CBMZ  Current Medications: Current Outpatient Medications  Medication Sig Dispense Refill  . acetaminophen (TYLENOL) 500 MG tablet Take 500 mg by mouth every 6 (six) hours as needed.    [name] aspirin EC 81 MG tablet Take 1 tablet (81 mg total) by mouth daily.    [name] atorvastatin (LIPITOR) 80 MG tablet TAKE 1 TABLET DAILY 90 tablet 3  . b complex vitamins capsule Take 1 capsule by mouth daily.    . Cholecalciferol (VITAMIN D3) 5000 units TABS Take 1 tablet by mouth daily.   0  . Ferrous Sulfate (IRON PO) Take by mouth. Taking 1 capsule daily    . furosemide (LASIX) 20 MG tablet TAKE 1 TABLET DAILY 90 tablet 3  . glucose blood test strip ONETOUCH ULTRA MINI STRIPS AND LANCETS. Use one daily to check blood sugar. 100 each 3  . KLOR-CON M20 20 MEQ tablet TAKE 2 TABLETS TWICE A DAY (NEEDS APPOINTMENT FOR FURTHER REFILLS) 360 tablet 0  . lansoprazole (PREVACID) 15 MG capsule Take 15 mg by mouth daily at 6 (six) AM.    . metFORMIN (GLUCOPHAGE-XR) 500 MG 24 hr tablet TAKE 1 TABLET BY MOUTH EVERY EVENING 90 tablet 1  . metoprolol tartrate (LOPRESSOR) 25 MG tablet TAKE 1 TABLET TWICE A DAY 180 tablet 0  . Multiple Vitamins-Minerals (MULTI-VITAMIN GUMMIES PO) Take by mouth. Taking 1 daily    . QUEtiapine (SEROQUEL) 100 MG tablet Take 1 tablet (100 mg total) by mouth at bedtime. 90 tablet 1  . venlafaxine XR (EFFEXOR-XR) 75 MG 24 hr capsule Take 1  capsule (75 mg total) by mouth daily with breakfast. 90 capsule 1  . zaleplon (SONATA) 10 MG capsule Take 1 capsule (10 mg total) by mouth at bedtime as needed for sleep. 30 capsule 2   No current facility-administered medications for this visit.     Musculoskeletal: Strength & Muscle Tone: unable to assess due to telemed visit Gait & Station: unable to assess due to telemed visit Patient leans: unable to assess due to telemed visit   Psychiatric Specialty Exam: ROS  There were no vitals taken for this visit.There is no height or weight on file to calculate BMI.  General Appearance: Fairly Groomed  Eye Contact:  Good  Speech:  Clear and Coherent and Normal Rate  Volume:  Normal  Mood:  Euthymic  Affect:  Congruent  Thought Process:  Goal Directed, Linear and Descriptions of Associations: Intact  Orientation:  Full (Time, Place, and Person)  Thought Content: Logical   Suicidal Thoughts:  No  Homicidal Thoughts:  No  Memory:  Recent;   Good Remote;   Good  Judgement:  Good  Insight:  Good  Psychomotor Activity:  Normal  Concentration:  Concentration: Good and Attention Span: Good  Recall:  Good  Fund of Knowledge: Good  Language: Good  Akathisia:  Negative  Handed:  Right  AIMS (if indicated): not tested  Assets:  Communication Skills Desire for Improvement Financial Resources/Insurance Housing Leisure Time Social Support Talents/Skills Transportation  ADL's:  Intact  Cognition: WNL  Sleep:  Good with help of [employer]  Screenings: [place] Office Visit from 03/10/2018 in [facility] Total Score 0    PHQ2-9   [place] Office Visit from 04/27/2020 in [facility] Office Visit from 12/31/2017 in [facility] Office Visit from 11/15/2017 in [place] Office Visit from 09/20/2016 in [place]  PHQ-2 Total  Score 1 0 0 0  PHQ-9 Total Score 1 -- -- 3       Assessment and Plan: Patient appears to be doing well on her current regimen.  1. Bipolar disorder in remission (HCC)  - venlafaxine XR (EFFEXOR-XR) 75 MG 24 hr capsule; Take 1 capsule (75 mg total) by mouth daily with breakfast.  Dispense: 90 capsule; Refill: 1 ([facility]) - QUEtiapine (SEROQUEL) 100 MG tablet; Take 1 tablet (100 mg total) by mouth at bedtime.  Dispense: 90 tablet; Refill: 1 ([facility])  - Continue Sonata 10 mg HS (local pharmacy)  2. Post traumatic stress disorder (PTSD)  - venlafaxine XR (EFFEXOR-XR) 75 MG 24 hr capsule; Take 1 capsule (75 mg total) by mouth daily with breakfast.  Dispense: 90 capsule; Refill: 1  Continue same regimen. F/up in 3 months. Patient was made aware that her care is being transferred to a different provider and [place] psychiatry clinic.   Nevada Crane, MD 07/06/2020, 11:02 AM

## 2020-07-25 ENCOUNTER — Other Ambulatory Visit: Payer: Self-pay | Admitting: Family Medicine

## 2020-07-25 DIAGNOSIS — E119 Type 2 diabetes mellitus without complications: Secondary | ICD-10-CM

## 2020-08-10 ENCOUNTER — Ambulatory Visit (INDEPENDENT_AMBULATORY_CARE_PROVIDER_SITE_OTHER): Payer: BC Managed Care – PPO | Admitting: Internal Medicine

## 2020-08-10 ENCOUNTER — Other Ambulatory Visit: Payer: Self-pay

## 2020-08-10 ENCOUNTER — Encounter: Payer: Self-pay | Admitting: Internal Medicine

## 2020-08-10 VITALS — BP 110/66 | HR 82 | Ht 63.0 in | Wt 145.0 lb

## 2020-08-10 DIAGNOSIS — R55 Syncope and collapse: Secondary | ICD-10-CM | POA: Diagnosis not present

## 2020-08-10 DIAGNOSIS — I739 Peripheral vascular disease, unspecified: Secondary | ICD-10-CM

## 2020-08-10 DIAGNOSIS — I471 Supraventricular tachycardia: Secondary | ICD-10-CM

## 2020-08-10 DIAGNOSIS — I5032 Chronic diastolic (congestive) heart failure: Secondary | ICD-10-CM | POA: Diagnosis not present

## 2020-08-10 DIAGNOSIS — E785 Hyperlipidemia, unspecified: Secondary | ICD-10-CM

## 2020-08-10 DIAGNOSIS — I2510 Atherosclerotic heart disease of native coronary artery without angina pectoris: Secondary | ICD-10-CM

## 2020-08-10 MED ORDER — FUROSEMIDE 20 MG PO TABS: ORAL_TABLET | ORAL | 3 refills | Status: DC

## 2020-08-10 MED ORDER — POTASSIUM CHLORIDE CRYS ER 20 MEQ PO TBCR: EXTENDED_RELEASE_TABLET | ORAL | 0 refills | Status: DC

## 2020-08-10 NOTE — Progress Notes (Signed)
Follow-up Outpatient Visit Date: 08/10/2020  Primary Care Provider: [name], MD [address] [place] [address]  Chief Complaint: Follow-up syncope  HPI:  Katelyn Cooper is a 65 y.o. female with history of nonobstructive CAD, PAD status post left iliac stent,chronicHFpEF,diet-controlled DM2, thoracic outlet syndrome status post bilateral rib resections (6962), GERD complicated by esophageal stricture, HLD, and anxiety/depression, who presents for follow-up of chronic HFpEF.  She was last seen in our office in early March for evaluation of a syncopal episode that occurred after stubbing her toe with associated severe pain.  It was felt that her syncope was precipitated by a vasovagal episode complicated by orthostatic hypotension in the setting of dehydration.  Subsequent event monitor showed predominantly sinus rhythm with a few episodes of PSVT lasting up to 19 seconds as well as rare PACs and PVCs.  Today, Ms. Frankum reports she has been feeling fairly well.  She has occasional orthostatic lightheadedness but has not passed out again.  She has stable exertional dyspnea but is walking 3 miles a day, 3 days/week.  She does not have any claudication.  She has not had any chest pain or palpitations.  She also denies edema.  She is trying to drink more fluid.  She remains on furosemide 20 mg daily.  --------------------------------------------------------------------------------------------------  Cardiovascular History & Procedures: Cardiovascular Problems:  Nonobstructive coronary artery disease  Peripheral vascular status post left iliac stent  HFpEF  Risk Factors:  Diabetes mellitus, hyperlipidemia, peripheral vascular disease  Cath/PCI:  L/RHC (11/08/2017): LMCA with minimal diffuse disease. LAD normal. 30% stenosis at first septal perforator. LCx with mild luminal irregularities. OM1 with 30% disease. RCA with sequential 30% proximal and mid  stenoses.Upper normal filling pressures and normal pulmonary artery pressure with moderately reduced Fick cardiac output (CO 3.1/CI 1.8).  LHC (01/12/16): LMCA and LAD normal. There is a 40% stenosis in the proximal aspect of the large first septal perforator. There is 30% OM1 stenosis. 30% proximal and mid RCA lesions are also present. LVEDP mildly elevated at 15-20 mmHg.  CV Surgery:  Remote left iliac stent.  EP Procedures and Devices:  14-day event monitor (06/07/2020): Predominantly sinus rhythm with few episodes of PSVT (lasting up to 18.8 seconds) as well as rare PACs and PVCs.  Non-Invasive Evaluation(s):  TTE (04/14/2019): Normal LV size and wall thickness with LVEF 60-65%. There is grade 1 diastolic dysfunction with elevated filling pressures. Normal RV size and function. No significant valvular abnormality. Normal CVP. Unable to assess PA pressure.  Pharmacologic MPI (03/30/2019): Low risk study withotu ischemia or scar. Coronary artery calcification noted. LVEF >65%.  TTE (11/06/2017): Normal LV size and wall thickness. LVEF 50-55% with hypokinesis of the anteroseptal and anterior myocardium. Grade 2 diastolic dysfunction with elevated filling pressures. Mild MR. Normal RV size and function. Mild pulmonary hypertension (PASP 35 mmHg).  Pharmacologic myocardial perfusion stress test (12/23/15): Small, mild area of reversible defect involving the apical anterior and apical segments that could reflect ischemia or breast attenuation. LVEF 55-65%. Borderline TID was noted.  Transthoracic echocardiogram (12/28/15): Normal LV systolic and diastolic function (EF 95-28%). No significant valvular abnormalities. Normal RV size and function.  Aortoiliac and lower extremity Dopplers:Patentstent in the left common iliac artery. No significant abnormalities. ABI right 1.05, left 1.03.   Recent CV Pertinent Labs: Lab Results  Component Value Date   CHOL 138 03/14/2020   CHOL  160 04/17/2019   HDL 44 03/14/2020   HDL 55 04/17/2019   LDLCALC 53  03/14/2020   LDLCALC 76 04/17/2019   TRIG 203 (H) 03/14/2020   CHOLHDL 3.1 03/14/2020   INR 1.06 01/06/2016   BNP 32.1 06/02/2020   BNP 731.0 (H) 11/05/2017   K 4.2 06/02/2020   MG 2.5 (H) 11/08/2017   BUN 7 (L) 06/02/2020   CREATININE 0.80 06/02/2020    Past medical and surgical history were reviewed and updated in EPIC.  Current Meds  Medication Sig  . acetaminophen (TYLENOL) 500 MG tablet Take 500 mg by mouth every 6 (six) hours as needed.  [name] aspirin EC 81 MG tablet Take 1 tablet (81 mg total) by mouth daily.  [name] atorvastatin (LIPITOR) 80 MG tablet TAKE 1 TABLET DAILY  . b complex vitamins capsule Take 1 capsule by mouth daily.  . Cholecalciferol (VITAMIN D3) 5000 units TABS Take 1 tablet by mouth daily.   . Ferrous Sulfate (IRON PO) Take by mouth. Taking 1 capsule daily  . furosemide (LASIX) 20 MG tablet TAKE 1 TABLET DAILY  . glucose blood test strip ONETOUCH ULTRA MINI STRIPS AND LANCETS. Use one daily to check blood sugar.  . KLOR-CON M20 20 MEQ tablet TAKE 2 TABLETS TWICE A DAY (NEEDS APPOINTMENT FOR FURTHER REFILLS)  . lansoprazole (PREVACID) 15 MG capsule Take 15 mg by mouth daily at 6 (six) AM.  . metFORMIN (GLUCOPHAGE-XR) 500 MG 24 hr tablet TAKE 1 TABLET BY MOUTH EVERY EVENING . GENERIC EQUIVALENT FOR GLUCOPHAGE-XR  . metoprolol tartrate (LOPRESSOR) 25 MG tablet TAKE 1 TABLET TWICE A DAY  . Multiple Vitamins-Minerals (MULTI-VITAMIN GUMMIES PO) Take by mouth. Taking 1 daily  . QUEtiapine (SEROQUEL) 100 MG tablet Take 1 tablet (100 mg total) by mouth at bedtime.  [name] venlafaxine XR (EFFEXOR-XR) 75 MG 24 hr capsule Take 1 capsule (75 mg total) by mouth daily with breakfast.  . zaleplon (SONATA) 10 MG capsule Take 1 capsule (10 mg total) by mouth at bedtime as needed for sleep.    Allergies: Lovastatin, Paroxetine hcl, and Lamotrigine  Social History   Tobacco Use  . Smoking status: Current Every Day  Smoker    Packs/day: 0.00    Years: 40.00    Pack years: 0.00    Types: Cigarettes    Start date: 03/01/1975    Last attempt to quit: 08/11/2017    Years since quitting: 3.0  . Smokeless tobacco: Never Used  . Tobacco comment: smokes 10 cigarettes per day  Vaping Use  . Vaping Use: Former  Substance Use Topics  . Alcohol use: No    Alcohol/week: 0.0 standard drinks  . Drug use: No    Family History  Problem Relation Age of Onset  . Lung cancer Mother        was a smoker  . Emphysema Mother   . Cancer Mother        Lung  . Alcohol abuse Mother   . Depression Mother   . Heart disease Mother   . Drug abuse Sister   . Breast cancer Sister        x 2  (30&50)  . Emphysema Sister   . Bipolar disorder Sister   . Alcohol abuse Other   . Depression Other   . Bipolar disorder Other   . Heart disease Other   . Vision loss Other   . Alcohol abuse Father   . Mood Disorder Father   . Heart disease Maternal Grandmother   . Stroke Maternal Grandmother   . Esophageal cancer Maternal Aunt   . Colon  cancer Neg Hx   . Colon polyps Neg Hx   . Rectal cancer Neg Hx   . Stomach cancer Neg Hx     Review of Systems: A 12-system review of systems was performed and was negative except as noted in the HPI.  --------------------------------------------------------------------------------------------------  Physical Exam: BP 110/66 (BP Location: Left Arm, Patient Position: Sitting, Cuff Size: Normal)   Pulse 82   Ht 5\' 3"  (1.6 m)   Wt 145 lb (65.8 kg)   SpO2 95%   BMI 25.69 kg/m   General:  NAD. Neck: No JVD or HJR. Lungs: Clear to auscultation bilaterally without wheezes or crackles. Heart: Regular rate and rhythm without murmurs, rubs, or gallops. Abdomen: Soft, nontender, nondistended. Extremities: No lower extremity edema.  2+ posterior tibial and dorsalis pedis pulses bilaterally.   Lab Results  Component Value Date   WBC 8.7 06/02/2020   HGB 15.2 06/02/2020   HCT 45.0  06/02/2020   MCV 90 06/02/2020   PLT 208 06/02/2020    Lab Results  Component Value Date   NA 142 06/02/2020   K 4.2 06/02/2020   CL 105 06/02/2020   CO2 19 (L) 06/02/2020   BUN 7 (L) 06/02/2020   CREATININE 0.80 06/02/2020   GLUCOSE 123 (H) 06/02/2020   ALT 24 06/02/2020    Lab Results  Component Value Date   CHOL 138 03/14/2020   HDL 44 03/14/2020   LDLCALC 53 03/14/2020   TRIG 203 (H) 03/14/2020   CHOLHDL 3.1 03/14/2020    --------------------------------------------------------------------------------------------------  ASSESSMENT AND PLAN: Syncope: No further episodes noted.  I suspect that her syncopal episode in the setting of injuring her toe earlier this year was vasovagal confounded by orthostatic hypotension from intravascular volume depletion.  I encouraged her to remain well-hydrated.  We have agreed to make her furosemide and potassium supplementation as needed for weight gain/edema.  No further cardiac testing or intervention is recommended at this time.  Chronic HFpEF: Ms. Mclaurin appears euvolemic on exam today with stable NYHA class II symptoms.  As above, we will make her diuretic and potassium regimen as needed to help minimize risk for intravascular volume depletion.  Sodium restriction was again encouraged.  Nonobstructive coronary artery disease: No angina reported.  Continue current medications for secondary prevention.  PSVT: Brief atrial runs incidentally noted on recent event monitor without associated symptoms.  We will continue low-dose metoprolol.  PAD: No claudication reported status post remote left iliac stent.  Pedal pulses are brisk on exam today.  Continue secondary prevention with aspirin and atorvastatin.  Hyperlipidemia: LDL well controlled on last check in 03/2020.  Triglycerides were mildly elevated.  Continue atorvastatin 80 mg daily and ongoing diet/exercise regimen.  Follow-up: Return to clinic in 1 year.  Nelva Bush,  MD 08/10/2020 9:13 AM

## 2020-08-10 NOTE — Patient Instructions (Signed)
Medication Instructions:  - Your physician has recommended you make the following change in your medication:   1) CHANGE lasix (furosemide) 20 mg- take 1 tablet daily as needed for increased fluid/ weight gain  2) CHANGE potassium 20 meq- take 2 tablets (40 meq) by mouth once daily only on the days you are taking lasix  *If you need a refill on your cardiac medications before your next appointment, please call your pharmacy*   Lab Work: - none ordered  If you have labs (blood work) drawn today and your tests are completely normal, you will receive your results only by: [name] MyChart Message (if you have MyChart) OR . A paper copy in the mail If you have any lab test that is abnormal or we need to change your treatment, we will call you to review the results.   Testing/Procedures: - none ordered   Follow-Up: At [facility], you and your health needs are our priority.  As part of our continuing mission to provide you with exceptional heart care, we have created designated Provider Care Teams.  These Care Teams include your primary Cardiologist (physician) and Advanced Practice Providers (APPs -  Physician Assistants and Nurse Practitioners) who all work together to provide you with the care you need, when you need it.  We recommend signing up for the patient portal called "MyChart".  Sign up information is provided on this After Visit Summary.  MyChart is used to connect with patients for Virtual Visits (Telemedicine).  Patients are able to view lab/test results, encounter notes, upcoming appointments, etc.  Non-urgent messages can be sent to your provider as well.   To learn more about what you can do with MyChart, go to [email or web address].    Your next appointment:   1 year(s)  The format for your next appointment:   In Person  Provider:   You may see Nelva Bush, MD or one of the following Advanced Practice Providers on your designated Care Team:    Murray Hodgkins,  NP  Christell Faith, PA-C  Marrianne Mood, PA-C  Cadence [place], [place]  Laurann Montana, NP    Other Instructions n/a

## 2020-08-18 DIAGNOSIS — Z6825 Body mass index (BMI) 25.0-25.9, adult: Secondary | ICD-10-CM | POA: Diagnosis not present

## 2020-08-18 DIAGNOSIS — N951 Menopausal and female climacteric states: Secondary | ICD-10-CM | POA: Diagnosis not present

## 2020-08-18 DIAGNOSIS — Z1231 Encounter for screening mammogram for malignant neoplasm of breast: Secondary | ICD-10-CM | POA: Diagnosis not present

## 2020-08-18 DIAGNOSIS — Z124 Encounter for screening for malignant neoplasm of cervix: Secondary | ICD-10-CM | POA: Diagnosis not present

## 2020-08-18 DIAGNOSIS — Z01419 Encounter for gynecological examination (general) (routine) without abnormal findings: Secondary | ICD-10-CM | POA: Diagnosis not present

## 2020-08-18 LAB — HM MAMMOGRAPHY

## 2020-08-19 ENCOUNTER — Encounter: Payer: Self-pay | Admitting: Family Medicine

## 2020-08-23 ENCOUNTER — Other Ambulatory Visit: Payer: Self-pay | Admitting: Internal Medicine

## 2020-08-23 DIAGNOSIS — M8589 Other specified disorders of bone density and structure, multiple sites: Secondary | ICD-10-CM | POA: Diagnosis not present

## 2020-08-23 NOTE — Telephone Encounter (Signed)
Rx request sent to pharmacy.  

## 2020-09-16 DIAGNOSIS — G4733 Obstructive sleep apnea (adult) (pediatric): Secondary | ICD-10-CM | POA: Diagnosis not present
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
## 2020-10-05 ENCOUNTER — Encounter: Payer: Self-pay | Admitting: Psychiatry

## 2020-10-05 ENCOUNTER — Other Ambulatory Visit: Payer: Self-pay

## 2020-10-05 ENCOUNTER — Telehealth (INDEPENDENT_AMBULATORY_CARE_PROVIDER_SITE_OTHER): Payer: BC Managed Care – PPO | Admitting: Psychiatry

## 2020-10-05 DIAGNOSIS — N952 Postmenopausal atrophic vaginitis: Secondary | ICD-10-CM | POA: Diagnosis not present

## 2020-10-05 DIAGNOSIS — G4700 Insomnia, unspecified: Secondary | ICD-10-CM

## 2020-10-05 DIAGNOSIS — F39 Unspecified mood [affective] disorder: Secondary | ICD-10-CM

## 2020-10-05 MED ORDER — ARIPIPRAZOLE 2 MG PO TABS: 2.0000 mg | ORAL_TABLET | Freq: Every day | ORAL | 1 refills | Status: DC

## 2020-10-05 MED ORDER — ZALEPLON 10 MG PO CAPS: 10.0000 mg | ORAL_CAPSULE | Freq: Every evening | ORAL | 1 refills | Status: DC | PRN

## 2020-10-05 NOTE — Patient Instructions (Signed)
Continue venlafaxine 75 mg daily Discontinue quetiapine  Start Abilify 2 mg at night  Continue Sonata 10 mg at night  Next appointment -August 15 at 4 PM

## 2020-10-10 ENCOUNTER — Telehealth: Payer: Self-pay

## 2020-10-10 ENCOUNTER — Other Ambulatory Visit: Payer: Self-pay

## 2020-10-10 ENCOUNTER — Encounter: Payer: Self-pay | Admitting: Surgical

## 2020-10-10 ENCOUNTER — Ambulatory Visit (INDEPENDENT_AMBULATORY_CARE_PROVIDER_SITE_OTHER): Payer: BC Managed Care – PPO | Admitting: Surgical

## 2020-10-10 ENCOUNTER — Ambulatory Visit: Payer: Self-pay

## 2020-10-10 DIAGNOSIS — M25552 Pain in left hip: Secondary | ICD-10-CM | POA: Diagnosis not present

## 2020-10-10 MED ORDER — LIDOCAINE HCL 1 % IJ SOLN
5.0000 mL | INTRAMUSCULAR | Status: AC | PRN
Start: 2020-10-10 — End: 2020-10-10
  Administered 2020-10-10: 5 mL

## 2020-10-10 MED ORDER — BUPIVACAINE HCL 0.25 % IJ SOLN
4.0000 mL | INTRAMUSCULAR | Status: AC | PRN
  Administered 2020-10-10: 4 mL via INTRA_ARTICULAR

## 2020-10-10 MED ORDER — METHYLPREDNISOLONE ACETATE 40 MG/ML IJ SUSP
40.0000 mg | INTRAMUSCULAR | Status: AC | PRN
  Administered 2020-10-10: 40 mg via INTRA_ARTICULAR

## 2020-10-10 NOTE — Telephone Encounter (Signed)
pt called states that she can not take the abilify that she could not sleep and it felt like she was jumping out of her skin.  she stated that she stopped it and went back on the seroquel

## 2020-10-10 NOTE — Progress Notes (Signed)
Office Visit Note   Patient: Katelyn Cooper           Date of Birth: [birth year]           MRN: [id number] Visit Date: 10/10/2020 Requested by: [name], [address],  [place] [address] PCP: [name], MD  Subjective: Chief Complaint  Patient presents with   Left Hip - Pain    HPI: Katelyn Cooper is a 65 y.o. female who presents to the office complaining of left hip pain.  Patient notes lateral hip pain for the last 3 weeks without injury.  She has constant pain that comes and goes in severity.  Pain mostly bothers her as the day goes on and with ambulation.  She denies any groin pain.  Does not bother her at night but she does not sleep on her side, she only sleeps on her back.  She has felt some warmth and point tenderness over the lateral hip.  No radicular pain or numbness/tingling or back pain.  She has tried Voltaren gel and heat and Tylenol without relief.  She cannot take NSAIDs due to GI upset.  She enjoys walking 3 times per week with a friend for about 3 miles but last week she was only able to do 1.5 miles due to severe pain..                ROS: All systems reviewed are negative as they relate to the chief complaint within the history of present illness.  Patient denies fevers or chills.  Assessment & Plan: Visit Diagnoses:  1. Greater trochanteric pain syndrome of left lower extremity     Plan: Patient is a 65 year old female who presents complaining of left lateral hip pain.  Notes pain over the last 3 weeks without injury.  All the pain is localized over the greater trochanter.  She has no groin pain by her history and no radicular pain either.  Impression is greater trochanteric pain syndrome.  Discussed options available to patient.  She would like to proceed with ultrasound-guided greater trochanteric injection.  Injection successfully delivered to the inferior aspect of the greater trochanter with no significant resistance during  injection.  She will give this 1 to 2 weeks for it to take effect and if she has continued pain following this injection she will return for reevaluation by Dr. Marlou Sa.  Follow-Up Instructions: No follow-ups on file.   Orders:  Orders Placed This Encounter  Procedures   US Guided Needle Placement - No Linked Charges   No orders of the defined types were placed in this encounter.     Procedures: Large Joint Inj: L greater trochanter on 10/10/2020 9:41 AM Indications: pain and diagnostic evaluation Details: 18 G 3.5 in needle, ultrasound-guided lateral approach  Arthrogram: No  Medications: 5 mL lidocaine 1 %; 4 mL bupivacaine 0.25 %; 40 mg methylPREDNISolone acetate 40 MG/ML Outcome: tolerated well, no immediate complications Procedure, treatment alternatives, risks and benefits explained, specific risks discussed. Consent was given by the patient. Immediately prior to procedure a time out was called to verify the correct patient, procedure, equipment, support staff and site/side marked as required. Patient was prepped and draped in the usual sterile fashion.      Clinical Data: No additional findings.  Objective: Vital Signs: There were no vitals taken for this visit.  Physical Exam:  Constitutional: Patient appears well-developed HEENT:  Head: Normocephalic Eyes:EOM are normal Neck: Normal range of  motion Cardiovascular: Normal rate Pulmonary/chest: Effort normal Neurologic: Patient is alert Skin: Skin is warm Psychiatric: Patient has normal mood and affect  Ortho Exam: Ortho exam demonstrates left hip with moderate point tenderness over the left greater trochanter that is asymmetric with no tenderness over the right.  Incision from prior IT band lengthening procedure near the left greater trochanter.  No Trendelenburg gait.  No pain with hip range of motion.  Excellent hip flexion and abduction strength with increased pain with hip abduction on the left.  Negative  Stinchfield sign.  Negative straight leg raise.  Specialty Comments:  No specialty comments available.  Imaging: No results found.   PMFS History: Patient Active Problem List   Diagnosis Date Noted   PSVT (paroxysmal supraventricular tachycardia) ([place]) 08/10/2020   Pain 05/31/2020   Vasovagal syncope 05/31/2020   Type 2 diabetes mellitus with diabetic peripheral angiopathy without gangrene, without long-term current use of insulin ([place]) 03/16/2020   Diarrhea 01/27/2020   Angular cheilitis 10/16/2019   Bipolar disorder in remission ([place]) 02/09/2019   Post traumatic stress disorder (PTSD) 02/09/2019   Chronic heart failure with preserved ejection fraction (HFpEF) ([place]) 11/15/2017   HTN (hypertension) 11/15/2017   Obstructive sleep apnea 11/15/2017   NICM (nonischemic cardiomyopathy) ([place]) 11/13/2017   CAD in native artery 11/13/2017   Menopausal symptom 06/13/2017   Osteopenia 06/13/2017   Uterine leiomyoma 06/13/2017   PAD (peripheral artery disease) ([place]) 09/19/2016   Hyperlipidemia LDL goal <70 09/19/2016   Coronary artery disease involving native coronary artery of native heart without angina pectoris 09/05/2016   Abnormal stress test 01/12/2016   Pleuritic chest pain 07/28/2015   Duodenitis 01/05/2015   Dermatitis, nummular 01/05/2015   Allergic rhinitis 12/23/2014   Ataxia 12/23/2014   Back ache 12/23/2014   Cervical muscle strain 12/23/2014   Eczema 12/23/2014   GERD (gastroesophageal reflux disease) 12/23/2014   History of anemia 12/23/2014   Pure hypercholesterolemia 12/23/2014   Nephropyelitis 12/23/2014   Tremor 12/23/2014   Vitamin D deficiency 11/03/2013   Diabetes mellitus type 2, controlled ([place]) 08/13/2011   Cough 05/10/2011   Chest pain 05/10/2011   Tobacco abuse 05/10/2011   Past Medical History:  Diagnosis Date   (HFpEF) heart failure with preserved ejection fraction ([place])    a. 12/2015 Echo: EF 55-60%, nl RV size/fxn, no significant valvular  abnormalities; b. 10/2017 Echo: EF 50-55%, antsept, ant HK. Gr2 DD. Mild MR. Nl RV fxn. PASP 82mmHg.   Anxiety    Basal cell carcinoma 08/08/2017   L nose above mid nasal alar groove   CHF (congestive heart failure) (HCC)    Depression    Diabetes type 2, controlled ([place])    diet-controlled   Esophageal stricture    GERD (gastroesophageal reflux disease)    Hiatal hernia    History of chicken pox    Hyperlipidemia    Hypertension    Internal hemorrhoids    Non-obstructive CAD (coronary artery disease)    a. 12/2015 MV: apical ant and apical reversible defect. EF 55-65%; b. 01/2016 Cath: LM nl, LAD nl, SP1 40ost, LCX nl, OM1 30, RCA 30p/m, EDP 15-11mmHg; c. 10/2017 Cath: LM nl, LAD nl, SP1 30ost, LCX nl, OM1 30, RCA 30p/m. EF 55%.   Obstructive sleep apnea    PAD (peripheral artery disease) ([place])    a. 1990's s/p prior LCIA stenting; b. 12/2015 ABI: R 1.05, L 1.03.   Panic disorder     Family History  Problem Relation Age of Onset  Lung cancer Mother        was a smoker   Emphysema Mother    Cancer Mother        Lung   Alcohol abuse Mother    Depression Mother    Heart disease Mother    Drug abuse Sister    Breast cancer Sister        x 2  (30&50)   Emphysema Sister    Bipolar disorder Sister    Alcohol abuse Other    Depression Other    Bipolar disorder Other    Heart disease Other    Vision loss Other    Alcohol abuse Father    Mood Disorder Father    Heart disease Maternal Grandmother    Stroke Maternal Grandmother    Esophageal cancer Maternal Aunt    Colon cancer Neg Hx    Colon polyps Neg Hx    Rectal cancer Neg Hx    Stomach cancer Neg Hx     Past Surgical History:  Procedure Laterality Date   AORTA SURGERY  1999   stent placement; ? left iliac artery intervention   APPENDECTOMY  1998   BASAL CELL CARCINOMA EXCISION     CARDIAC CATHETERIZATION N/A 01/12/2016   Procedure: Left Heart Cath and Coronary Angiography;  Surgeon: Nelva Bush, MD;  Location:  [place] [facility];  Service: Cardiovascular;  Laterality: N/A;   COLONOSCOPY  07/17/2019   FOOT SURGERY     Right   pap smear  03/2010   Done by [name], normal   REFRACTIVE SURGERY     right   RIB RESECTION  [id number]   RIGHT/LEFT HEART CATH AND CORONARY ANGIOGRAPHY N/A 11/08/2017   Procedure: RIGHT/LEFT HEART CATH AND CORONARY ANGIOGRAPHY;  Surgeon: Wellington Hampshire, MD;  Location: [place] [facility];  Service: Cardiovascular;  Laterality: N/A;   SHOULDER SURGERY  2005   left   UPPER GASTROINTESTINAL ENDOSCOPY  07/17/2019   UPPER GI ENDOSCOPY  09/19/2011   Stricture at GE junction, dilated. Mild gastritis and duodenitis. Small hiatal hernia   Social History   Occupational History   Occupation: computer-retired    Fish farm manager: [employer]  Tobacco Use   Smoking status: Every Day    Packs/day: 0.00    Years: 40.00    Pack years: 0.00    Types: Cigarettes    Start date: 03/01/1975    Last attempt to quit: 08/11/2017    Years since quitting: 3.1   Smokeless tobacco: Never   Tobacco comments:    smokes 10 cigarettes per day  Vaping Use   Vaping Use: Former  Substance and Sexual Activity   Alcohol use: No    Alcohol/week: 0.0 standard drinks   Drug use: No   Sexual activity: Not Currently

## 2020-10-10 NOTE — Telephone Encounter (Signed)
Noted, thanks!

## 2020-10-11 ENCOUNTER — Other Ambulatory Visit: Payer: Self-pay

## 2020-10-11 ENCOUNTER — Encounter: Payer: Self-pay | Admitting: Internal Medicine

## 2020-10-11 ENCOUNTER — Ambulatory Visit (INDEPENDENT_AMBULATORY_CARE_PROVIDER_SITE_OTHER): Payer: BC Managed Care – PPO | Admitting: Internal Medicine

## 2020-10-11 VITALS — BP 112/72 | HR 96 | Temp 97.6°F | Ht 63.0 in | Wt 146.8 lb

## 2020-10-11 DIAGNOSIS — G4733 Obstructive sleep apnea (adult) (pediatric): Secondary | ICD-10-CM | POA: Diagnosis not present

## 2020-10-11 DIAGNOSIS — Z720 Tobacco use: Secondary | ICD-10-CM

## 2020-10-11 DIAGNOSIS — J449 Chronic obstructive pulmonary disease, unspecified: Secondary | ICD-10-CM

## 2020-10-11 DIAGNOSIS — R911 Solitary pulmonary nodule: Secondary | ICD-10-CM | POA: Diagnosis not present

## 2020-10-11 DIAGNOSIS — F17210 Nicotine dependence, cigarettes, uncomplicated: Secondary | ICD-10-CM

## 2020-10-11 NOTE — Patient Instructions (Addendum)
Please stop smoking Follow-up CT chest in 1 year Continue CPAP for underlying sleep apnea OSA

## 2020-10-11 NOTE — Progress Notes (Signed)
[facility] Consultation      Date: 10/11/2020,   MRN# [id number] Kareem Aul [facility] 01-Aug-1955     AdmissionWeight: 146 lb 12.8 oz (66.6 kg)                 CurrentWeight: 146 lb 12.8 oz (66.6 kg) Katelyn Cooper is a 65 y.o. old female seen in consultation for cough, pleuritic chest pain. Previous BQ patient   PFT FINDINGS PFT's 08/2015 reviewed with patient ratio 80% Fev1 2.2 L 87% FVC 2.6L 83%  CT chest 08/30/15 Left lung Nodule approx 6 MM increased from 2-3 MM from 2013.    CT chest 12/2015 Left Lung nodule approx 6 MM-no change from previous CT scans   CT chest 09/05/16 There is a right lower lobe small opacity probable mucus plugging mucoid impaction  CT chest 3.7.19 The left lower lobe nodule has not increased in size significantly from previous CAT scan  CT chest 8/19 Edema and effusions Left Nodule stable  findings reveiwed with patient  CT chest 05/2019 Stable 5 mm left lung nodule, consistent with benign perifissural lymph node. No active lung disease.  ECHO 8/19 left ventricular filling pattern, with   concomitant abnormal relaxation and increased filling pressure   (grade 2 diastolic dysfunction).    CC Follow-up OSA  follow-up COPD, Follow-up CT chest  CC CT cheFCCollow-up OSA HPI  COPD seems to be stable at this time No exacerbation at this time No evidence of heart failure at this time No evidence or signs of infection at this time No respiratory distress No fevers, chills, nausea, vomiting, diarrhea No evidence of lower extremity edema No evidence hemoptysis  OSA therapy is going well OSA is under control AHI reduced to 3.1 Currently on auto CPAP 5 to 20 cm water pressure    CT chest lung cancer screening follow-up February 2021 Stable 5 mm lung nodule Continue protocol as scheduled Patient restarted to use tobacco Has been smoking for the last 1 year Repeat CT chest in 1 year pending to assess lung nodule and  high risk for cancer   Smoking Assessment and Cessation Counseling Upon further questioning, Patient smokes 1 ppd I have advised patient to quit/stop smoking as soon as possible due to high risk for multiple medical problems  Patient is willing to quit smoking  I have advised patient that we can assist and have options of Nicotine replacement therapy. I also advised patient on behavioral therapy and can provide oral medication therapy in conjunction with the other therapies Follow up next Office visit  for assessment of smoking cessation Smoking cessation counseling advised for 4 minutes     Current Medication:   Current Outpatient Medications:    acetaminophen (TYLENOL) 500 MG tablet, Take 500 mg by mouth every 6 (six) hours as needed., Disp: , Rfl:    albuterol (VENTOLIN HFA) 108 (90 Base) MCG/ACT inhaler, Ventolin HFA 90 mcg/actuation aerosol inhaler  inhale 2 puffs by mouth every 6 hours if needed for wheezing or shortness of breath, Disp: , Rfl:    ARIPiprazole (ABILIFY) 2 MG tablet, Take 1 tablet (2 mg total) by mouth at bedtime., Disp: 30 tablet, Rfl: 1   aspirin EC 81 MG tablet, Take 1 tablet (81 mg total) by mouth daily., Disp: , Rfl:    atorvastatin (LIPITOR) 80 MG tablet, TAKE 1 TABLET DAILY, Disp: 90 tablet, Rfl: 3   b complex vitamins capsule, Take 1 capsule by mouth daily., Disp: , Rfl:  Cholecalciferol (VITAMIN D3) 5000 units TABS, Take 1 tablet by mouth daily. , Disp: , Rfl: 0   Ferrous Sulfate (IRON PO), Take by mouth. Taking 1 capsule daily, Disp: , Rfl:    furosemide (LASIX) 20 MG tablet, Take 1 tablet (20 mg) by mouth once daily as needed for increased fluid/ weight gain, Disp: 90 tablet, Rfl: 3   glucose blood test strip, ONETOUCH ULTRA MINI STRIPS AND LANCETS. Use one daily to check blood sugar., Disp: 100 each, Rfl: 3   lansoprazole (PREVACID) 15 MG capsule, Take 15 mg by mouth daily at 6 (six) AM., Disp: , Rfl:    metFORMIN (GLUCOPHAGE-XR) 500 MG 24 hr tablet,  TAKE 1 TABLET BY MOUTH EVERY EVENING . GENERIC EQUIVALENT FOR GLUCOPHAGE-XR, Disp: 90 tablet, Rfl: 0   metoprolol tartrate (LOPRESSOR) 25 MG tablet, TAKE 1 TABLET TWICE A DAY, Disp: 180 tablet, Rfl: 1   Multiple Vitamins-Minerals (MULTI-VITAMIN GUMMIES PO), Take by mouth. Taking 1 daily, Disp: , Rfl:    potassium chloride SA (KLOR-CON M20) 20 MEQ tablet, Take 2 tablets (40 meq) by mouth once daily only on days you are taking lasix, Disp: 360 tablet, Rfl: 0   VAGIFEM 10 MCG TABS vaginal tablet, Place 1 tablet vaginally 2 (two) times a week., Disp: , Rfl:    venlafaxine XR (EFFEXOR-XR) 75 MG 24 hr capsule, Take 1 capsule (75 mg total) by mouth daily with breakfast., Disp: 90 capsule, Rfl: 1   zaleplon (SONATA) 10 MG capsule, Take 1 capsule (10 mg total) by mouth at bedtime as needed for sleep., Disp: 30 capsule, Rfl: 1     ALLERGIES   Lovastatin, Paroxetine hcl, and Lamotrigine     REVIEW OF SYSTEMS   Review of Systems  Constitutional:  Negative for chills, fever, malaise/fatigue and weight loss.  HENT:  Negative for congestion.   Respiratory:  Negative for cough, hemoptysis, sputum production, shortness of breath and wheezing.   Cardiovascular:  Negative for chest pain, palpitations, orthopnea and leg swelling.  Gastrointestinal:  Negative for nausea.  Psychiatric/Behavioral:  Depression: .   All other systems reviewed and are negative.  BP 112/72 (BP Location: Left Arm, Cuff Size: Normal)   Pulse 96   Temp 97.6 F (36.4 C) (Temporal)   Ht 5\' 3"  (1.6 m)   Wt 146 lb 12.8 oz (66.6 kg)   SpO2 97%   BMI 26.00 kg/m   Physical Examination:   General Appearance: No distress  EYES PERRLA, EOM intact.   NECK Supple, No JVD Pulmonary: normal breath sounds, No wheezing.  CardiovascularNormal S1,S2.  No m/r/g.   Abdomen: Benign, Soft, non-tender. Skin:   warm, no rashes, no ecchymosis  Extremities: normal, no cyanosis, clubbing. Neuro:without focal findings,  speech normal   PSYCHIATRIC: Mood, affect within normal limits.   ALL OTHER ROS ARE NEGATIVE    ALL OTHER ROS ARE NEGATIVE    ASSESSMENT/PLAN   65 year old female seen today for follow-up assessment for COPD with underlying grade 2 diastolic heart failure with underlying sleep apnea enrolled in the lung cancer screening program with a history of extensive smoking history has now restarted to abuse tobacco    COPD stable  Patient uses albuterol as needed   no additional inhalers at this time No exacerbation at this time   OSA Excellent compliance report Continue therapy as prescribed  Patient use and benefits from therapy  2 diastolic dysfunction Follow-up cardiology as scheduled  Left lung nodule lung cancer screening protocol CT of the chest reviewed  with patient February 2021 Stable 5 mm left lung nodule consistent with benign however patient restarted smoking Will need follow-up CT chest in 1 year for assessment  Smoking cessation strongly advised    MEDICATION ADJUSTMENTS/LABS AND TESTS ORDERED: Albuterol as needed Avoid sick contacts Continue auto CPAP therapy Follow-up CT chest 1 year Albuterol as needed Please stop smoking  CURRENT MEDICATIONS REVIEWED AT LENGTH WITH PATIENT TODAY   Follow up 1 year  Total time spent 35 minutes  Alvar Malinoski Patricia Pesa, M.D.  [facility]  Medical Director [place] Director [facility] Cardio-Pulmonary Department

## 2020-10-17 ENCOUNTER — Ambulatory Visit (INDEPENDENT_AMBULATORY_CARE_PROVIDER_SITE_OTHER): Payer: BC Managed Care – PPO | Admitting: Family Medicine

## 2020-10-17 ENCOUNTER — Encounter: Payer: Self-pay | Admitting: Family Medicine

## 2020-10-17 VITALS — BP 112/63 | HR 73 | Temp 98.0°F | Resp 16 | Ht 63.0 in | Wt 148.0 lb

## 2020-10-17 DIAGNOSIS — E119 Type 2 diabetes mellitus without complications: Secondary | ICD-10-CM | POA: Diagnosis not present

## 2020-10-17 DIAGNOSIS — I10 Essential (primary) hypertension: Secondary | ICD-10-CM | POA: Diagnosis not present

## 2020-10-17 LAB — POCT GLYCOSYLATED HEMOGLOBIN (HGB A1C)
Est. average glucose Bld gHb Est-mCnc: 134
Hemoglobin A1C: 6.3 % — AB (ref 4.0–5.6)

## 2020-10-17 NOTE — Progress Notes (Deleted)
Established patient visit   Patient: Katelyn Cooper   DOB: [birth year]   65 y.o. Female  MRN: [id number] Visit Date: 10/17/2020  Today's healthcare provider: Lelon Huh, MD   Chief Complaint  Patient presents with   Diabetes   Hypertension   Subjective    Diabetes Pertinent negatives for hypoglycemia include no dizziness or headaches. Pertinent negatives for diabetes include no chest pain and no fatigue.  Hypertension Pertinent negatives include no chest pain, headaches, palpitations or shortness of breath.   Diabetes Mellitus Type II, Follow-up  Lab Results  Component Value Date   HGBA1C 6.3 (A) 10/17/2020   HGBA1C 6.3 (H) 06/02/2020   HGBA1C 6.3 (A) 04/27/2020   Wt Readings from Last 3 Encounters:  10/17/20 148 lb (67.1 kg)  10/11/20 146 lb 12.8 oz (66.6 kg)  08/10/20 145 lb (65.8 kg)   Last seen for diabetes 6 months ago.  Management since then includes no medication changes. She reports good compliance with treatment. She is not having side effects.  Symptoms: No fatigue No foot ulcerations  No appetite changes No nausea  No paresthesia of the feet  No polydipsia  No polyuria No visual disturbances   No vomiting     Home blood sugar records:  not being checked  Episodes of hypoglycemia? No    Current insulin regiment: none Most Recent Eye Exam: 12/27/2019 Current exercise: no regular exercise Current diet habits: well balanced  Pertinent Labs: Lab Results  Component Value Date   CHOL 138 03/14/2020   HDL 44 03/14/2020   LDLCALC 53 03/14/2020   TRIG 203 (H) 03/14/2020   CHOLHDL 3.1 03/14/2020   Lab Results  Component Value Date   NA 142 06/02/2020   K 4.2 06/02/2020   CREATININE 0.80 06/02/2020   GFRNONAA 65 01/27/2020   GFRAA 75 01/27/2020   GLUCOSE 123 (H) 06/02/2020     --------------------------------------------------------------------------------------------------- Hypertension, follow-up  BP Readings from Last 3 Encounters:   10/17/20 112/63  10/11/20 112/72  08/10/20 110/66   Wt Readings from Last 3 Encounters:  10/17/20 148 lb (67.1 kg)  10/11/20 146 lb 12.8 oz (66.6 kg)  08/10/20 145 lb (65.8 kg)     She was last seen for hypertension 6 months ago.  BP at that visit was 110/66. Management since that visit includes no medication changes.  She reports good compliance with treatment. She is not having side effects.  She is following a Regular diet. She is not exercising. She does smoke.  Use of agents associated with hypertension: none.   Outside blood pressures are not being checked. Symptoms: No chest pain No chest pressure  No palpitations No syncope  No dyspnea No orthopnea  No paroxysmal nocturnal dyspnea No lower extremity edema   Pertinent labs: Lab Results  Component Value Date   CHOL 138 03/14/2020   HDL 44 03/14/2020   LDLCALC 53 03/14/2020   TRIG 203 (H) 03/14/2020   CHOLHDL 3.1 03/14/2020   Lab Results  Component Value Date   NA 142 06/02/2020   K 4.2 06/02/2020   CREATININE 0.80 06/02/2020   GFRNONAA 65 01/27/2020   GFRAA 75 01/27/2020   GLUCOSE 123 (H) 06/02/2020     The 10-year ASCVD risk score Mikey Bussing DC Jr., et al., 2013) is: 19%    {Show patient history (optional):23778}   Medications: Outpatient Medications Prior to Visit  Medication Sig   acetaminophen (TYLENOL) 500 MG tablet Take 500 mg by mouth every 6 (six) hours  as needed.   albuterol (VENTOLIN HFA) 108 (90 Base) MCG/ACT inhaler Ventolin HFA 90 mcg/actuation aerosol inhaler  inhale 2 puffs by mouth every 6 hours if needed for wheezing or shortness of breath   ARIPiprazole (ABILIFY) 2 MG tablet Take 1 tablet (2 mg total) by mouth at bedtime.   aspirin EC 81 MG tablet Take 1 tablet (81 mg total) by mouth daily.   atorvastatin (LIPITOR) 80 MG tablet TAKE 1 TABLET DAILY   b complex vitamins capsule Take 1 capsule by mouth daily.   Cholecalciferol (VITAMIN D3) 5000 units TABS Take 1 tablet by mouth daily.     Ferrous Sulfate (IRON PO) Take by mouth. Taking 1 capsule daily   furosemide (LASIX) 20 MG tablet Take 1 tablet (20 mg) by mouth once daily as needed for increased fluid/ weight gain   glucose blood test strip ONETOUCH ULTRA MINI STRIPS AND LANCETS. Use one daily to check blood sugar.   lansoprazole (PREVACID) 15 MG capsule Take 15 mg by mouth daily at 6 (six) AM.   metFORMIN (GLUCOPHAGE-XR) 500 MG 24 hr tablet TAKE 1 TABLET BY MOUTH EVERY EVENING . GENERIC EQUIVALENT FOR GLUCOPHAGE-XR   metoprolol tartrate (LOPRESSOR) 25 MG tablet TAKE 1 TABLET TWICE A DAY   Multiple Vitamins-Minerals (MULTI-VITAMIN GUMMIES PO) Take by mouth. Taking 1 daily   potassium chloride SA (KLOR-CON M20) 20 MEQ tablet Take 2 tablets (40 meq) by mouth once daily only on days you are taking lasix   VAGIFEM 10 MCG TABS vaginal tablet Place 1 tablet vaginally 2 (two) times a week.   venlafaxine XR (EFFEXOR-XR) 75 MG 24 hr capsule Take 1 capsule (75 mg total) by mouth daily with breakfast.   zaleplon (SONATA) 10 MG capsule Take 1 capsule (10 mg total) by mouth at bedtime as needed for sleep.   No facility-administered medications prior to visit.    Review of Systems  Constitutional:  Negative for activity change and fatigue.  Respiratory:  Negative for cough and shortness of breath.   Cardiovascular:  Negative for chest pain, palpitations and leg swelling.  Musculoskeletal:  Negative for arthralgias and joint swelling.  Neurological:  Negative for dizziness, light-headedness and headaches.   {Labs  Heme  Chem  Endocrine  Serology  Results Review (optional):23779}   Objective    BP 112/63   Pulse 73   Temp 98 F (36.7 C)   Resp 16   Ht 5\' 3"  (1.6 m)   Wt 148 lb (67.1 kg)   BMI 26.22 kg/m  {Show previous vital signs (optional):23777}   Physical Exam  ***  Results for orders placed or performed in visit on 10/17/20  POCT glycosylated hemoglobin (Hb A1C)  Result Value Ref Range   Hemoglobin A1C 6.3 (A)  4.0 - 5.6 %   HbA1c POC (<> result, manual entry)     HbA1c, POC (prediabetic range)     HbA1c, POC (controlled diabetic range)     Est. average glucose Bld gHb Est-mCnc 134     Assessment & Plan     ***  No follow-ups on file.      {provider attestation***:1}   Lelon Huh, MD  [facility] [phone] (phone) [phone] (fax)  [place]

## 2020-10-17 NOTE — Progress Notes (Signed)
Established patient visit   Patient: Katelyn Cooper   DOB: 04-Jan-1956   65 y.o. Female  MRN: [id number] Visit Date: 10/17/2020  Today's healthcare provider: Lelon Huh, MD   Chief Complaint  Patient presents with   Diabetes   Hypertension   Subjective    HPI  She is here to follow up on diabetes last a1c was 6.3. is doing well with diet and exercise with no new complaints today. Is tolerating metformin with adverse effect. Had routine exam and mammogram with Dr. Mancel Bale in May.       Medications: Outpatient Medications Prior to Visit  Medication Sig   acetaminophen (TYLENOL) 500 MG tablet Take 500 mg by mouth every 6 (six) hours as needed.   albuterol (VENTOLIN HFA) 108 (90 Base) MCG/ACT inhaler Ventolin HFA 90 mcg/actuation aerosol inhaler  inhale 2 puffs by mouth every 6 hours if needed for wheezing or shortness of breath   ARIPiprazole (ABILIFY) 2 MG tablet Take 1 tablet (2 mg total) by mouth at bedtime.   aspirin EC 81 MG tablet Take 1 tablet (81 mg total) by mouth daily.   atorvastatin (LIPITOR) 80 MG tablet TAKE 1 TABLET DAILY   b complex vitamins capsule Take 1 capsule by mouth daily.   Cholecalciferol (VITAMIN D3) 5000 units TABS Take 1 tablet by mouth daily.    Ferrous Sulfate (IRON PO) Take by mouth. Taking 1 capsule daily   furosemide (LASIX) 20 MG tablet Take 1 tablet (20 mg) by mouth once daily as needed for increased fluid/ weight gain   glucose blood test strip ONETOUCH ULTRA MINI STRIPS AND LANCETS. Use one daily to check blood sugar.   lansoprazole (PREVACID) 15 MG capsule Take 15 mg by mouth daily at 6 (six) AM.   metFORMIN (GLUCOPHAGE-XR) 500 MG 24 hr tablet TAKE 1 TABLET BY MOUTH EVERY EVENING . GENERIC EQUIVALENT FOR GLUCOPHAGE-XR   metoprolol tartrate (LOPRESSOR) 25 MG tablet TAKE 1 TABLET TWICE A DAY   Multiple Vitamins-Minerals (MULTI-VITAMIN GUMMIES PO) Take by mouth. Taking 1 daily   potassium chloride SA (KLOR-CON M20) 20 MEQ tablet Take 2  tablets (40 meq) by mouth once daily only on days you are taking lasix   VAGIFEM 10 MCG TABS vaginal tablet Place 1 tablet vaginally 2 (two) times a week.   venlafaxine XR (EFFEXOR-XR) 75 MG 24 hr capsule Take 1 capsule (75 mg total) by mouth daily with breakfast.   zaleplon (SONATA) 10 MG capsule Take 1 capsule (10 mg total) by mouth at bedtime as needed for sleep.   No facility-administered medications prior to visit.       Objective    BP 112/63   Pulse 73   Temp 98 F (36.7 C)   Resp 16   Ht 5\' 3"  (1.6 m)   Wt 148 lb (67.1 kg)   BMI 26.22 kg/m     Physical Exam  General appearance:  Well developed, well nourished female, cooperative and in no acute distress Head: Normocephalic, without obvious abnormality, atraumatic Respiratory: Respirations even and unlabored, normal respiratory rate Extremities: All extremities are intact.  Skin: Skin color, texture, turgor normal. No rashes seen  Psych: Appropriate mood and affect. Neurologic: Mental status: Alert, oriented to person, place, and time, thought content appropriate.   Results for orders placed or performed in visit on 10/17/20  POCT glycosylated hemoglobin (Hb A1C)  Result Value Ref Range   Hemoglobin A1C 6.3 (A) 4.0 - 5.6 %   Est. average  glucose Bld gHb Est-mCnc 134     Assessment & Plan     1. Controlled type 2 diabetes mellitus without complication, without long-term current use of insulin ([place]) Doing well, continue metformin.  - Urine Microalbumin w/creat. ratio  2. Primary hypertension Well controlled.  Continue current medications.         The entirety of the information documented in the History of Present Illness, Review of Systems and Physical Exam were personally obtained by me. Portions of this information were initially documented by the CMA and reviewed by me for thoroughness and accuracy.     Lelon Huh, MD  [facility] [phone] (phone) [phone] (fax)  [place]

## 2020-10-18 LAB — MICROALBUMIN / CREATININE URINE RATIO
Creatinine, Urine: 165.1 mg/dL
Microalb/Creat Ratio: 15 mg/g creat (ref 0–29)
Microalbumin, Urine: 24.5 ug/mL

## 2020-11-07 ENCOUNTER — Encounter: Payer: Self-pay | Admitting: Dermatology

## 2020-11-07 ENCOUNTER — Ambulatory Visit (INDEPENDENT_AMBULATORY_CARE_PROVIDER_SITE_OTHER): Payer: BC Managed Care – PPO | Admitting: Dermatology

## 2020-11-07 ENCOUNTER — Other Ambulatory Visit: Payer: Self-pay

## 2020-11-07 DIAGNOSIS — D692 Other nonthrombocytopenic purpura: Secondary | ICD-10-CM

## 2020-11-07 DIAGNOSIS — L918 Other hypertrophic disorders of the skin: Secondary | ICD-10-CM

## 2020-11-07 DIAGNOSIS — Z85828 Personal history of other malignant neoplasm of skin: Secondary | ICD-10-CM

## 2020-11-07 DIAGNOSIS — D229 Melanocytic nevi, unspecified: Secondary | ICD-10-CM

## 2020-11-07 DIAGNOSIS — L814 Other melanin hyperpigmentation: Secondary | ICD-10-CM | POA: Diagnosis not present

## 2020-11-07 DIAGNOSIS — Z1283 Encounter for screening for malignant neoplasm of skin: Secondary | ICD-10-CM

## 2020-11-07 DIAGNOSIS — L821 Other seborrheic keratosis: Secondary | ICD-10-CM | POA: Diagnosis not present

## 2020-11-07 DIAGNOSIS — D1800 Hemangioma unspecified site: Secondary | ICD-10-CM

## 2020-11-07 DIAGNOSIS — L578 Other skin changes due to chronic exposure to nonionizing radiation: Secondary | ICD-10-CM

## 2020-11-07 NOTE — Progress Notes (Signed)
   Follow-Up Visit   Subjective  Katelyn Cooper is a 65 y.o. female who presents for the following: Total body skin exam (Hx of BCC L nose above mid nasal alar groove). The patient presents for Total-Body Skin Exam (TBSE) for skin cancer screening and mole check.  The following portions of the chart were reviewed this encounter and updated as appropriate:   Tobacco  Allergies  Meds  Problems  Med Hx  Surg Hx  Fam Hx     Review of Systems:  No other skin or systemic complaints except as noted in HPI or Assessment and Plan.  Objective  Well appearing patient in no apparent distress; mood and affect are within normal limits.  A full examination was performed including scalp, head, eyes, ears, nose, lips, neck, chest, axillae, abdomen, back, buttocks, bilateral upper extremities, bilateral lower extremities, hands, feet, fingers, toes, fingernails, and toenails. All findings within normal limits unless otherwise noted below.  L nose above mid nasal alar groove Well healed scar with no evidence of recurrence.    Assessment & Plan   Lentigines - Scattered tan macules - Due to sun exposure - Benign-appering, observe - Recommend daily broad spectrum sunscreen SPF 30+ to sun-exposed areas, reapply every 2 hours as needed. - Call for any changes  Seborrheic Keratoses - Stuck-on, waxy, tan-brown papules and/or plaques  - Benign-appearing - Discussed benign etiology and prognosis. - Observe - Call for any changes  Melanocytic Nevi - Tan-brown and/or pink-flesh-colored symmetric macules and papules - Benign appearing on exam today - Observation - Call clinic for new or changing moles - Recommend daily use of broad spectrum spf 30+ sunscreen to sun-exposed areas.   Hemangiomas - Red papules - Discussed benign nature - Observe - Call for any changes  Actinic Damage - Chronic condition, secondary to cumulative UV/sun exposure - diffuse scaly erythematous macules with  underlying dyspigmentation - Recommend daily broad spectrum sunscreen SPF 30+ to sun-exposed areas, reapply every 2 hours as needed.  - Staying in the shade or wearing long sleeves, sun glasses (UVA+UVB protection) and wide brim hats (4-inch brim around the entire circumference of the hat) are also recommended for sun protection.  - Call for new or changing lesions.  Skin cancer screening performed today.  Purpura - Chronic; persistent and recurrent.  Treatable, but not curable. - Violaceous macules and patches - Benign - Related to trauma, age, sun damage and/or use of blood thinners, chronic use of topical and/or oral steroids - Observe - Can use OTC arnica containing moisturizer such as Dermend Bruise Formula if desired - Call for worsening or other concerns  Acrochordons (Skin Tags) - Fleshy, skin-colored pedunculated papules - Benign appearing.  - Observe. - If desired, they can be removed with an in office procedure that is not covered by insurance. - Please call the clinic if you notice any new or changing lesions.  - Neck  History of basal cell carcinoma (BCC) L nose above mid nasal alar groove  Clear. Observe for recurrence. Call clinic for new or changing lesions.  Recommend regular skin exams, daily broad-spectrum spf 30+ sunscreen use, and photoprotection.    Skin cancer screening  Return in about 1 year (around 11/07/2021) for TBSE, Hx of BCC.  I, Othelia Pulling, RMA, am acting as scribe for Sarina Ser, MD . Documentation: I have reviewed the above documentation for accuracy and completeness, and I agree with the above.  Sarina Ser, MD

## 2020-11-07 NOTE — Patient Instructions (Signed)
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
## 2020-11-14 ENCOUNTER — Encounter: Payer: Self-pay | Admitting: Psychiatry

## 2020-11-14 ENCOUNTER — Telehealth: Payer: Self-pay | Admitting: Internal Medicine

## 2020-11-14 ENCOUNTER — Other Ambulatory Visit: Payer: Self-pay

## 2020-11-14 ENCOUNTER — Telehealth (INDEPENDENT_AMBULATORY_CARE_PROVIDER_SITE_OTHER): Payer: BC Managed Care – PPO | Admitting: Psychiatry

## 2020-11-14 DIAGNOSIS — F39 Unspecified mood [affective] disorder: Secondary | ICD-10-CM

## 2020-11-14 DIAGNOSIS — F4310 Post-traumatic stress disorder, unspecified: Secondary | ICD-10-CM

## 2020-11-14 DIAGNOSIS — F3170 Bipolar disorder, currently in remission, most recent episode unspecified: Secondary | ICD-10-CM | POA: Diagnosis not present

## 2020-11-14 MED ORDER — ZALEPLON 10 MG PO CAPS: 10.0000 mg | ORAL_CAPSULE | Freq: Every evening | ORAL | 1 refills | Status: DC | PRN

## 2020-11-14 MED ORDER — VENLAFAXINE HCL ER 75 MG PO CP24: 75.0000 mg | ORAL_CAPSULE | Freq: Every day | ORAL | 1 refills | Status: DC

## 2020-11-14 MED ORDER — POTASSIUM CHLORIDE CRYS ER 20 MEQ PO TBCR: EXTENDED_RELEASE_TABLET | ORAL | 0 refills | Status: DC

## 2020-11-14 NOTE — Telephone Encounter (Signed)
*  STAT* If patient is at the pharmacy, call can be transferred to refill team.   1. Which medications need to be refilled? (please list name of each medication and dose if known)  Potassium chloride 20 MEQ 2 tablets every other day   2. Which pharmacy/location (including street and city if local pharmacy) is medication to be sent to? [facility]   3. Do they need a 30 day or 90 day supply? 90 day

## 2020-11-14 NOTE — Telephone Encounter (Signed)
Requested Prescriptions   Signed Prescriptions Disp Refills   potassium chloride SA (KLOR-CON M20) 20 MEQ tablet 90 tablet 0    Sig: Take 2 tablets (40 meq) by mouth once daily only on days you are taking lasix    Authorizing Provider: END, CHRISTOPHER    Ordering User: Britt Bottom

## 2020-11-14 NOTE — Addendum Note (Signed)
Addended by: [name] on: 11/14/2020 05:02 PM   Modules accepted: Orders

## 2020-11-14 NOTE — Patient Instructions (Signed)
Continue venlafaxine 75 mg daily Decrease quetiapine 50 mg   Continue Sonata 10 mg at night  Next appointment - 10/17 at 10 AM  The next visit will be in person visit. Please arrive 15 mins before the scheduled time.   [facility]  Address: [address], [place], [place] [address]

## 2020-11-28 DIAGNOSIS — N952 Postmenopausal atrophic vaginitis: Secondary | ICD-10-CM | POA: Diagnosis not present

## 2020-11-28 DIAGNOSIS — N761 Subacute and chronic vaginitis: Secondary | ICD-10-CM | POA: Diagnosis not present

## 2020-12-06 ENCOUNTER — Telehealth: Payer: Self-pay

## 2020-12-06 NOTE — Telephone Encounter (Signed)
Medication problem - Patient called stating she has noticed since going down on Seroquel to a 1/2 pill instructed to her previously by Dr. Modesta Messing, she has noticed she is getting angry quicker and more often over things that would not typically upset her.  Patient stated she does not like feeling a little more out of control with that emotion and questions if should go back to taking a full tablet daily.  Agreed to send request to Dr. Modesta Messing.

## 2020-12-06 NOTE — Telephone Encounter (Signed)
Advise her to be back on quetiapine 100 mg at this time. Although I believe she has refills of medication, let me know if she needs any.

## 2020-12-06 NOTE — Telephone Encounter (Signed)
Medication management - Telephone call with patient to inform of her need to go back up to Seroquel 100 mg at bedtime. Pt. stated she still had the 100 mg pills, as she has been taking a 1/2 tablet of this so will go back up as ordered.  Patient agreed if any problems with mood persisted she would call our office back as denied any safety concerns at this time.  Patient able to relate back to this nurse to take a whole 100 mg tablet of Seroquel now.

## 2021-01-03 DIAGNOSIS — Z23 Encounter for immunization: Secondary | ICD-10-CM | POA: Diagnosis not present

## 2021-01-04 DIAGNOSIS — N763 Subacute and chronic vulvitis: Secondary | ICD-10-CM | POA: Diagnosis not present

## 2021-01-04 DIAGNOSIS — N905 Atrophy of vulva: Secondary | ICD-10-CM | POA: Diagnosis not present
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
## 2021-01-16 ENCOUNTER — Other Ambulatory Visit: Payer: Self-pay

## 2021-01-16 ENCOUNTER — Ambulatory Visit (INDEPENDENT_AMBULATORY_CARE_PROVIDER_SITE_OTHER): Payer: BC Managed Care – PPO | Admitting: Psychiatry

## 2021-01-16 ENCOUNTER — Encounter: Payer: Self-pay | Admitting: Psychiatry

## 2021-01-16 VITALS — BP 111/70 | HR 87 | Temp 97.6°F | Wt 150.4 lb

## 2021-01-16 DIAGNOSIS — F3170 Bipolar disorder, currently in remission, most recent episode unspecified: Secondary | ICD-10-CM

## 2021-01-16 DIAGNOSIS — G4700 Insomnia, unspecified: Secondary | ICD-10-CM | POA: Diagnosis not present

## 2021-01-16 MED ORDER — LATUDA 20 MG PO TABS: 20.0000 mg | ORAL_TABLET | Freq: Every day | ORAL | 1 refills | Status: DC

## 2021-01-16 MED ORDER — ZALEPLON 10 MG PO CAPS: 10.0000 mg | ORAL_CAPSULE | Freq: Every evening | ORAL | 1 refills | Status: DC | PRN

## 2021-01-16 NOTE — Patient Instructions (Signed)
Continue venlafaxine 75 mg daily Start Latuda 20 mg daily Discontinue quetiapine  Continue Sonata 10 mg at night  Next appointment - 12/13 at 8 AM

## 2021-02-14 ENCOUNTER — Other Ambulatory Visit: Payer: Self-pay | Admitting: Family Medicine

## 2021-02-14 ENCOUNTER — Other Ambulatory Visit: Payer: Self-pay | Admitting: Internal Medicine

## 2021-02-14 DIAGNOSIS — E119 Type 2 diabetes mellitus without complications: Secondary | ICD-10-CM

## 2021-02-27 ENCOUNTER — Other Ambulatory Visit: Payer: Self-pay | Admitting: *Deleted

## 2021-02-27 MED ORDER — POTASSIUM CHLORIDE CRYS ER 20 MEQ PO TBCR: EXTENDED_RELEASE_TABLET | ORAL | 0 refills | Status: DC

## 2021-03-06 ENCOUNTER — Other Ambulatory Visit: Payer: Self-pay | Admitting: Internal Medicine

## 2021-03-08 NOTE — Progress Notes (Signed)
Virtual Visit via Video Note  I connected with Katelyn Cooper on 03/14/21 at 10:30 AM EST by a video enabled telemedicine application and verified that I am speaking with the correct person using two identifiers.  Location: Patient: home Provider: office Persons participated in the visit- patient, provider    I discussed the limitations of evaluation and management by telemedicine and the availability of in person appointments. The patient expressed understanding and agreed to proceed.    I discussed the assessment and treatment plan with the patient. The patient was provided an opportunity to ask questions and all were answered. The patient agreed with the plan and demonstrated an understanding of the instructions.   The patient was advised to call back or seek an in-person evaluation if the symptoms worsen or if the condition fails to improve as anticipated.  I provided 19 minutes of non-face-to-face time during this encounter.   [name], MD    [facility] MD/PA/NP OP Progress Note  03/14/2021 10:59 AM Katelyn Cooper  MRN:  [id number]  Chief Complaint:  Chief Complaint   Follow-up; Depression    HPI:  This is a follow-up appointment for depression and insomnia.  She states that she has been doing pretty good.  She could not continue Latuda as she could not sleep well despite she tried taking it in the morning.  She has been doing better with less irritability since being back on quetiapine.  She prefers to stay on this medication after having trial of 2 medication.  She has been busy; she was asked by her granddaughter to make snacks.  She also enjoys doing needlepoint.  She enjoys taking a walk with her friend few times per week.  She reports good relationship with her husband, and had a nice Thanksgiving with them.  She denies any concern at this time.  She sleeps well.  She denies feeling depressed or anhedonia; she usually does better when she is busy.  She has been working on  eating healthy diet.  She agrees to do more exercise.  She denies SI.  She denies anxiety or panic attacks.  She denies alcohol use or drug use.    147 lbs, Used to weigh 115 lbs Wt Readings from Last 3 Encounters:  01/16/21 150 lb 6.4 oz (68.2 kg)  10/17/20 148 lb (67.1 kg)  10/11/20 146 lb 12.8 oz (66.6 kg)      Daily routine: Exercise: walk 3 miles, 3 days a week with her friend Employment: retired/Used to work for [employer] in 2018, work 3 days a week at [employer].  Support: husband Household: husband Marital status: married in 1996. Married twice Number of children: 0. 1 step son from previous marriage (she raised him. 66 yo in 2022) Last PCP / ongoing medical evaluation:  due next month for annual visit   Visit Diagnosis:    ICD-10-CM   1. MDD (major depressive disorder), recurrent, in partial remission ([place])  F33.41     2. Insomnia, unspecified type  G47.00 zaleplon (SONATA) 10 MG capsule      Past Psychiatric History: Please see initial evaluation for full details. I have reviewed the history. No updates at this time.     Past Medical History:  Past Medical History:  Diagnosis Date   (HFpEF) heart failure with preserved ejection fraction ([place])    a. 12/2015 Echo: EF 55-60%, nl RV size/fxn, no significant valvular abnormalities; b. 10/2017 Echo: EF 50-55%, antsept, ant HK. Gr2 DD. Mild MR.  Nl RV fxn. PASP 64mmHg.   Anxiety    Basal cell carcinoma 08/08/2017   L nose above mid nasal alar groove   CHF (congestive heart failure) (HCC)    Depression    Diabetes type 2, controlled ([place])    diet-controlled   Esophageal stricture    GERD (gastroesophageal reflux disease)    Hiatal hernia    History of chicken pox    Hyperlipidemia    Hypertension    Internal hemorrhoids    Non-obstructive CAD (coronary artery disease)    a. 12/2015 MV: apical ant and apical reversible defect. EF 55-65%; b. 01/2016 Cath: LM nl, LAD nl, SP1 40ost, LCX nl, OM1 30, RCA 30p/m, EDP  15-73mmHg; c. 10/2017 Cath: LM nl, LAD nl, SP1 30ost, LCX nl, OM1 30, RCA 30p/m. EF 55%.   Obstructive sleep apnea    PAD (peripheral artery disease) ([place])    a. 1990's s/p prior LCIA stenting; b. 12/2015 ABI: R 1.05, L 1.03.   Panic disorder     Past Surgical History:  Procedure Laterality Date   AORTA SURGERY  1999   stent placement; ? left iliac artery intervention   APPENDECTOMY  1998   BASAL CELL CARCINOMA EXCISION     CARDIAC CATHETERIZATION N/A 01/12/2016   Procedure: Left Heart Cath and Coronary Angiography;  Surgeon: Nelva Bush, MD;  Location: [facility];  Service: Cardiovascular;  Laterality: N/A;   COLONOSCOPY  07/17/2019   FOOT SURGERY     Right   pap smear  03/2010   Done by [name], normal   REFRACTIVE SURGERY     right   RIB RESECTION  [id number]   RIGHT/LEFT HEART CATH AND CORONARY ANGIOGRAPHY N/A 11/08/2017   Procedure: RIGHT/LEFT HEART CATH AND CORONARY ANGIOGRAPHY;  Surgeon: Wellington Hampshire, MD;  Location: [facility];  Service: Cardiovascular;  Laterality: N/A;   SHOULDER SURGERY  2005   left   UPPER GASTROINTESTINAL ENDOSCOPY  07/17/2019   UPPER GI ENDOSCOPY  09/19/2011   Stricture at GE junction, dilated. Mild gastritis and duodenitis. Small hiatal hernia    Family Psychiatric History: Please see initial evaluation for full details. I have reviewed the history. No updates at this time.     Family History:  Family History  Problem Relation Age of Onset   Lung cancer Mother        was a smoker   Emphysema Mother    Cancer Mother        Lung   Alcohol abuse Mother    Depression Mother    Heart disease Mother    Drug abuse Sister    Breast cancer Sister        x 2  (30&50)   Emphysema Sister    Bipolar disorder Sister    Alcohol abuse Other    Depression Other    Bipolar disorder Other    Heart disease Other    Vision loss Other    Alcohol abuse Father    Mood Disorder Father    Heart disease Maternal Grandmother     Stroke Maternal Grandmother    Esophageal cancer Maternal Aunt    Colon cancer Neg Hx    Colon polyps Neg Hx    Rectal cancer Neg Hx    Stomach cancer Neg Hx     Social History:  Social History   Socioeconomic History   Marital status: Married    Spouse name: mark   Number of children: 0  Years of education: Not on file   Highest education level: High school graduate  Occupational History   Occupation: computer-retired    Employer: [employer]  Tobacco Use   Smoking status: Every Day    Packs/day: 2.00    Years: 40.00    Pack years: 80.00    Types: Cigarettes    Start date: 03/01/1975    Last attempt to quit: 08/11/2017    Years since quitting: 3.5   Smokeless tobacco: Never   Tobacco comments:    0.75PPD 10/11/2020  Vaping Use   Vaping Use: Former  Substance and Sexual Activity   Alcohol use: No    Alcohol/week: 0.0 standard drinks   Drug use: No   Sexual activity: Not Currently  Other Topics Concern   Not on file  Social History Narrative   Not on file   Social Determinants of Health   Financial Resource Strain: Not on file  Food Insecurity: Not on file  Transportation Needs: Not on file  Physical Activity: Not on file  Stress: Not on file  Social Connections: Not on file    Allergies:  Allergies  Allergen Reactions   Lovastatin     depression   Paroxetine Hcl     itching   Lamotrigine Rash    Metabolic Disorder Labs: Lab Results  Component Value Date   HGBA1C 6.3 (A) 10/17/2020   No results found for: PROLACTIN Lab Results  Component Value Date   CHOL 138 03/14/2020   TRIG 203 (H) 03/14/2020   HDL 44 03/14/2020   CHOLHDL 3.1 03/14/2020   VLDL 41 (H) 03/14/2020   LDLCALC 53 03/14/2020   LDLCALC 76 04/17/2019   Lab Results  Component Value Date   TSH 1.660 06/02/2020   TSH 1.550 04/17/2019    Therapeutic Level Labs: No results found for: LITHIUM No results found for: VALPROATE No components found for:  CBMZ  Current  Medications: Current Outpatient Medications  Medication Sig Dispense Refill   QUEtiapine (SEROQUEL) 100 MG tablet Take 1 tablet (100 mg total) by mouth at bedtime. 90 tablet 0   acetaminophen (TYLENOL) 500 MG tablet Take 500 mg by mouth every 6 (six) hours as needed.     albuterol (VENTOLIN HFA) 108 (90 Base) MCG/ACT inhaler Ventolin HFA 90 mcg/actuation aerosol inhaler  inhale 2 puffs by mouth every 6 hours if needed for wheezing or shortness of breath     aspirin EC 81 MG tablet Take 1 tablet (81 mg total) by mouth daily.     atorvastatin (LIPITOR) 80 MG tablet TAKE 1 TABLET DAILY 90 tablet 3   b complex vitamins capsule Take 1 capsule by mouth daily.     Cholecalciferol (VITAMIN D3) 5000 units TABS Take 1 tablet by mouth daily.   0   clobetasol ointment (TEMOVATE) 0.05 % clobetasol 0.05 % topical ointment  PLEASE SEE ATTACHED FOR DETAILED DIRECTIONS     furosemide (LASIX) 20 MG tablet TAKE 1 TABLET DAILY PRN FOR INCREASED FLUID OR WEIGHT GAIN 90 tablet 0   glucose blood test strip ONETOUCH ULTRA MINI STRIPS AND LANCETS. Use one daily to check blood sugar. 100 each 3   lansoprazole (PREVACID) 15 MG capsule Take 15 mg by mouth daily at 6 (six) AM.     metFORMIN (GLUCOPHAGE-XR) 500 MG 24 hr tablet TAKE 1 TABLET BY MOUTH EVERY EVENING . GENERIC EQUIVALENT FOR GLUCOPHAGE-XR 90 tablet 4   metoprolol tartrate (LOPRESSOR) 25 MG tablet TAKE 1 TABLET TWICE A DAY 180 tablet  1   Multiple Vitamins-Minerals (MULTI-VITAMIN GUMMIES PO) Take by mouth. Taking 1 daily     potassium chloride SA (KLOR-CON M20) 20 MEQ tablet Take 2 tablets (40 meq) by mouth once daily only on days you are taking lasix 90 tablet 0   venlafaxine XR (EFFEXOR-XR) 75 MG 24 hr capsule Take 1 capsule (75 mg total) by mouth daily with breakfast. 90 capsule 1   [START ON 04/02/2021] zaleplon (SONATA) 10 MG capsule Take 1 capsule (10 mg total) by mouth at bedtime as needed for sleep. 30 capsule 2   No current facility-administered  medications for this visit.     Musculoskeletal: Strength & Muscle Tone:  N/A Gait & Station:  N/A Patient leans: N/A  Psychiatric Specialty Exam: Review of Systems  Psychiatric/Behavioral:  Negative for agitation, behavioral problems, confusion, decreased concentration, dysphoric mood, hallucinations, self-injury, sleep disturbance and suicidal ideas. The patient is not nervous/anxious and is not hyperactive.   All other systems reviewed and are negative.  There were no vitals taken for this visit.There is no height or weight on file to calculate BMI.  General Appearance: Fairly Groomed  Eye Contact:  Good  Speech:  Clear and Coherent  Volume:  Normal  Mood:   good  Affect:  Appropriate, Congruent, and Full Range  Thought Process:  Coherent  Orientation:  Full (Time, Place, and Person)  Thought Content: Logical   Suicidal Thoughts:  No  Homicidal Thoughts:  No  Memory:  Immediate;   Good  Judgement:  Good  Insight:  Good  Psychomotor Activity:  Normal  Concentration:  Concentration: Good and Attention Span: Good  Recall:  Good  Fund of Knowledge: Good  Language: Good  Akathisia:  No  Handed:  Right  AIMS (if indicated): not done  Assets:  Communication Skills Desire for Improvement  ADL's:  Intact  Cognition: WNL  Sleep:  Good   Screenings: AIMS    Flowsheet Row Office Visit from 03/10/2018 in [place] Total Score 0      PHQ2-9    Flowsheet Row Video Visit from 03/14/2021 in [place] Office Visit from 01/16/2021 in [place] Video Visit from 10/05/2020 in [place] Office Visit from 04/27/2020 in [place] Visit from 12/31/2017 in [place]  PHQ-2 Total Score 0 0 0 1 0  PHQ-9 Total Score -- -- -- 1 --      Flowsheet Row Video Visit from 03/14/2021 in [place] Video Visit from 10/05/2020 in [place] No Risk No Risk        Assessment and Plan:  Aamani Moose is a 65 y.o. year old female with a history of PTSD, depression, nonobstructive CAD, PAD status post left iliac stent, chronic HFpEF, diet-controlled DM2, thoracic outlet syndrome status post bilateral rib resections (5621), GERD complicated by esophageal stricture, HLD, who presents for follow up appointment for below.    2. MDD (major depressive disorder), recurrent, in partial remission ([place]) There has been overall improvement in irritability since switching back to quetiapine.  She could not tolerate Latuda due to insomnia/ongoing irritability.  She reports good support from her husband, and maintains good connection with her friend.  Will continue current dose of quetiapine given fating trial of Latuda/Abilify to mitigate metabolic side effect.  Discussed potential metabolic side effect and EPS.  Will continue venlafaxine to target  depression and anxiety. Noted that although she was diagnosed with bipolar 1 disorder in the past, she denies any significant manic symptoms except irritability.  Will continue to monitor.   1. Insomnia, unspecified type She reports good benefit from [place].  We will continue current dose to target insomnia.    Plan Continue venlafaxine 75 mg daily Discontinue Latuda Restart quetiapine 100 mg at night (on metformin) Continue Sonata 10 mg at night  Next appointment - 3/9 at 1 Pm for 20 mins, video - on Vagifem 10 mg, twice a week, inseritble   Past trials of medication-  Paxil, mirtazapine, Latuda (insomnia), Abilify, Lamotrigine, Gabapentin   The patient demonstrates the following risk factors for suicide: Chronic risk factors for suicide include: psychiatric disorder of depression . Acute risk factors for suicide include: N/A. Protective factors for this patient include:  positive social support, responsibility to others (children, family), coping skills, and hope for the future. Considering these factors, the overall suicide risk at this point appears to be low. Patient is appropriate for outpatient follow up.         [name], MD 03/14/2021, 10:59 AM

## 2021-03-14 ENCOUNTER — Telehealth: Payer: BC Managed Care – PPO | Admitting: Psychiatry

## 2021-03-14 ENCOUNTER — Encounter: Payer: Self-pay | Admitting: Psychiatry

## 2021-03-14 ENCOUNTER — Other Ambulatory Visit: Payer: Self-pay

## 2021-03-14 ENCOUNTER — Telehealth (INDEPENDENT_AMBULATORY_CARE_PROVIDER_SITE_OTHER): Payer: BC Managed Care – PPO | Admitting: Psychiatry

## 2021-03-14 DIAGNOSIS — F3341 Major depressive disorder, recurrent, in partial remission: Secondary | ICD-10-CM | POA: Diagnosis not present

## 2021-03-14 DIAGNOSIS — G4700 Insomnia, unspecified: Secondary | ICD-10-CM | POA: Diagnosis not present

## 2021-03-14 MED ORDER — QUETIAPINE FUMARATE 100 MG PO TABS: 100.0000 mg | ORAL_TABLET | Freq: Every day | ORAL | 0 refills | Status: DC

## 2021-03-14 MED ORDER — ZALEPLON 10 MG PO CAPS: 10.0000 mg | ORAL_CAPSULE | Freq: Every evening | ORAL | 2 refills | Status: DC | PRN

## 2021-03-14 NOTE — Patient Instructions (Addendum)
Continue venlafaxine 75 mg daily Discontinue Latuda Restart quetiapine 100 mg at night  Continue Sonata 10 mg at night  Next appointment - 3/9 at 1 PM, video

## 2021-03-15 DIAGNOSIS — G4733 Obstructive sleep apnea (adult) (pediatric): Secondary | ICD-10-CM | POA: Diagnosis not present

## 2021-03-21 ENCOUNTER — Telehealth: Payer: Self-pay | Admitting: Internal Medicine

## 2021-03-21 NOTE — Telephone Encounter (Signed)
Dr. Mortimer Fries we had Katelyn Cooper scheduled for her CT on 03/23/21 her insurance denied her to have the CT. I faxed records for [employer] to do Provider courtesy review and they still denied her CT

## 2021-03-23 ENCOUNTER — Ambulatory Visit: Payer: BC Managed Care – PPO

## 2021-04-11 NOTE — Telephone Encounter (Signed)
This is what the letter stated  "Your doctor told us that you have a small spot on your lung.  Your doctor ordered a CT scan of your chest to recheck this spot.  A CT  is a way to take pictures of the inside of your body.   This test should be used once.  12 months after a spot was noticed. We reviewed the notes we have. The notes show that you  already had one CT scan done to recheck this spot. The notes do not show that you developed new problems since this testing. This test is not medically necessary

## 2021-04-24 ENCOUNTER — Other Ambulatory Visit: Payer: Self-pay

## 2021-04-24 ENCOUNTER — Encounter: Payer: Self-pay | Admitting: Family Medicine

## 2021-04-24 ENCOUNTER — Ambulatory Visit (INDEPENDENT_AMBULATORY_CARE_PROVIDER_SITE_OTHER): Payer: BC Managed Care – PPO | Admitting: Family Medicine

## 2021-04-24 VITALS — BP 118/61 | HR 73 | Temp 98.5°F | Resp 16 | Wt 150.0 lb

## 2021-04-24 DIAGNOSIS — E1151 Type 2 diabetes mellitus with diabetic peripheral angiopathy without gangrene: Secondary | ICD-10-CM

## 2021-04-24 DIAGNOSIS — M8580 Other specified disorders of bone density and structure, unspecified site: Secondary | ICD-10-CM

## 2021-04-24 DIAGNOSIS — Z23 Encounter for immunization: Secondary | ICD-10-CM

## 2021-04-24 DIAGNOSIS — E785 Hyperlipidemia, unspecified: Secondary | ICD-10-CM

## 2021-04-24 DIAGNOSIS — I10 Essential (primary) hypertension: Secondary | ICD-10-CM | POA: Diagnosis not present

## 2021-04-24 DIAGNOSIS — E559 Vitamin D deficiency, unspecified: Secondary | ICD-10-CM | POA: Diagnosis not present

## 2021-04-24 LAB — POCT GLYCOSYLATED HEMOGLOBIN (HGB A1C)
Est. average glucose Bld gHb Est-mCnc: 143
Hemoglobin A1C: 6.6 % — AB (ref 4.0–5.6)

## 2021-04-24 NOTE — Patient Instructions (Signed)
.   Please review the attached list of medications and notify my office if there are any errors.   . Please contact your eyecare professional to schedule a routine eye exam  

## 2021-04-24 NOTE — Progress Notes (Signed)
Established patient visit   Patient: Katelyn Cooper   DOB: [birth year]   66 y.o. Female  MRN: [id number] Visit Date: 04/24/2021  Today's healthcare provider: Lelon Huh, MD   Chief Complaint  Patient presents with   Diabetes   Hypertension   Subjective    HPI  Hypertension, follow-up  BP Readings from Last 3 Encounters:  04/24/21 118/61  10/17/20 112/63  10/11/20 112/72   Wt Readings from Last 3 Encounters:  04/24/21 150 lb (68 kg)  10/17/20 148 lb (67.1 kg)  10/11/20 146 lb 12.8 oz (66.6 kg)     She was last seen for hypertension 6 months ago.  BP at that visit was 112/63. Management since that visit includes continue current treatment.  She reports good compliance with treatment. She is not having side effects.  She is following a Regular diet. She is exercising. She does smoke.  Use of agents associated with hypertension: NSAIDS.   Outside blood pressures are not checked. Symptoms: No chest pain No chest pressure  No palpitations No syncope  No dyspnea No orthopnea  No paroxysmal nocturnal dyspnea No lower extremity edema   Pertinent labs: Lab Results  Component Value Date   CHOL 138 03/14/2020   HDL 44 03/14/2020   LDLCALC 53 03/14/2020   TRIG 203 (H) 03/14/2020   CHOLHDL 3.1 03/14/2020   Lab Results  Component Value Date   NA 142 06/02/2020   K 4.2 06/02/2020   CREATININE 0.80 06/02/2020   EGFR 81 06/02/2020   GLUCOSE 123 (H) 06/02/2020   TSH 1.660 06/02/2020     The 10-year ASCVD risk score (Arnett DK, et al., 2019) is: 22.7%   --------------------------------------------------------------------------------------------------- Diabetes Mellitus Type II, Follow-up  Lab Results  Component Value Date   HGBA1C 6.3 (A) 10/17/2020   HGBA1C 6.3 (H) 06/02/2020   HGBA1C 6.3 (A) 04/27/2020   Wt Readings from Last 3 Encounters:  04/24/21 150 lb (68 kg)  10/17/20 148 lb (67.1 kg)  10/11/20 146 lb 12.8 oz (66.6 kg)   Last seen for  diabetes 6 months ago.  Management since then includes continue current treatment. She reports good compliance with treatment. She is not having side effects.  Symptoms: No fatigue No foot ulcerations  No appetite changes No nausea  No paresthesia of the feet  No polydipsia  No polyuria No visual disturbances   No vomiting     Home blood sugar records:  blood sugars are not checked  Episodes of hypoglycemia? No    Current insulin regiment: none Most Recent Eye Exam: <1 year ago Current exercise: walking Current diet habits: in general, an "unhealthy" diet   ---------------------------------------------------------------------------------------------------   Medications: Outpatient Medications Prior to Visit  Medication Sig   acetaminophen (TYLENOL) 500 MG tablet Take 500 mg by mouth every 6 (six) hours as needed.   albuterol (VENTOLIN HFA) 108 (90 Base) MCG/ACT inhaler Ventolin HFA 90 mcg/actuation aerosol inhaler  inhale 2 puffs by mouth every 6 hours if needed for wheezing or shortness of breath   aspirin EC 81 MG tablet Take 1 tablet (81 mg total) by mouth daily.   atorvastatin (LIPITOR) 80 MG tablet TAKE 1 TABLET DAILY   b complex vitamins capsule Take 1 capsule by mouth daily.   Cholecalciferol (VITAMIN D3) 5000 units TABS Take 1 tablet by mouth daily.    clobetasol ointment (TEMOVATE) 0.05 % clobetasol 0.05 % topical ointment  PLEASE SEE ATTACHED FOR DETAILED DIRECTIONS   furosemide (LASIX) 20  MG tablet TAKE 1 TABLET DAILY PRN FOR INCREASED FLUID OR WEIGHT GAIN   glucose blood test strip ONETOUCH ULTRA MINI STRIPS AND LANCETS. Use one daily to check blood sugar.   lansoprazole (PREVACID) 15 MG capsule Take 15 mg by mouth daily at 6 (six) AM.   metFORMIN (GLUCOPHAGE-XR) 500 MG 24 hr tablet TAKE 1 TABLET BY MOUTH EVERY EVENING . GENERIC EQUIVALENT FOR GLUCOPHAGE-XR   metoprolol tartrate (LOPRESSOR) 25 MG tablet TAKE 1 TABLET TWICE A DAY   Multiple Vitamins-Minerals  (MULTI-VITAMIN GUMMIES PO) Take by mouth. Taking 1 daily   potassium chloride SA (KLOR-CON M20) 20 MEQ tablet Take 2 tablets (40 meq) by mouth once daily only on days you are taking lasix   QUEtiapine (SEROQUEL) 100 MG tablet Take 1 tablet (100 mg total) by mouth at bedtime.   venlafaxine XR (EFFEXOR-XR) 75 MG 24 hr capsule Take 1 capsule (75 mg total) by mouth daily with breakfast.   zaleplon (SONATA) 10 MG capsule Take 1 capsule (10 mg total) by mouth at bedtime as needed for sleep.   No facility-administered medications prior to visit.    Review of Systems  Constitutional:  Negative for appetite change, chills, fatigue and fever.  Respiratory:  Positive for cough (dry cough). Negative for chest tightness and shortness of breath.   Cardiovascular:  Negative for chest pain and palpitations.  Gastrointestinal:  Negative for abdominal pain, nausea and vomiting.  Neurological:  Negative for dizziness and weakness.      Objective    BP 118/61 (BP Location: Left Arm, Patient Position: Sitting, Cuff Size: Normal)    Pulse 73    Temp 98.5 F (36.9 C) (Oral)    Resp 16    Wt 150 lb (68 kg)    SpO2 97% Comment: room air   BMI 26.57 kg/m  {Show previous vital signs (optional):23777}  Physical Exam  General appearance:  Well developed, well nourished female, cooperative and in no acute distress Head: Normocephalic, without obvious abnormality, atraumatic Respiratory: Respirations even and unlabored, normal respiratory rate Extremities: All extremities are intact.  Skin: Skin color, texture, turgor normal. No rashes seen  Psych: Appropriate mood and affect. Neurologic: Mental status: Alert, oriented to person, place, and time, thought content appropriate.   Results for orders placed or performed in visit on 04/24/21  HM DEXA SCAN  Result Value Ref Range   HM Dexa Scan Osteopenia   POCT HgB A1C  Result Value Ref Range   Hemoglobin A1C 6.6 (A) 4.0 - 5.6 %   Est. average glucose Bld gHb  Est-mCnc 143     Assessment & Plan     1. Type 2 diabetes mellitus with diabetic peripheral angiopathy without gangrene, without long-term current use of insulin (HCC) Doing well with metformin and A1c near goal. Continue current medications.    2. Primary hypertension Very well controlled. Continue current medications.   - Comprehensive metabolic panel - CBC  3. Osteopenia, unspecified location Per DEXA at Ob/Gyn 03/17/2018.  4. Vitamin D deficiency  - VITAMIN D 25 Hydroxy (Vit-D Deficiency, Fractures)  5. Hyperlipidemia LDL goal <70 She is tolerating atorvastatin well with no adverse effects.   - Lipid panel  6. Need for vaccination against Streptococcus pneumoniae  - Pneumococcal conjugate vaccine 20-valent (PCV20)   Future Appointments  Date Time Provider [place]  06/08/2021  1:00 PM [name], MD [facility] None  08/25/2021  8:40 AM [name], MD [facility]  11/22/2021  8:15 AM Ralene Bathe,  MD [facility] None         The entirety of the information documented in the History of Present Illness, Review of Systems and Physical Exam were personally obtained by me. Portions of this information were initially documented by the CMA and reviewed by me for thoroughness and accuracy.     Lelon Huh, MD  [facility] [phone] (phone) [phone] (fax)  [place]

## 2021-05-01 ENCOUNTER — Other Ambulatory Visit (HOSPITAL_COMMUNITY): Payer: Self-pay | Admitting: Psychiatry

## 2021-05-01 ENCOUNTER — Telehealth: Payer: Self-pay | Admitting: Internal Medicine

## 2021-05-01 DIAGNOSIS — I10 Essential (primary) hypertension: Secondary | ICD-10-CM | POA: Diagnosis not present

## 2021-05-01 DIAGNOSIS — E559 Vitamin D deficiency, unspecified: Secondary | ICD-10-CM | POA: Diagnosis not present

## 2021-05-01 DIAGNOSIS — E785 Hyperlipidemia, unspecified: Secondary | ICD-10-CM | POA: Diagnosis not present

## 2021-05-01 MED ORDER — ATORVASTATIN CALCIUM 80 MG PO TABS: 80.0000 mg | ORAL_TABLET | Freq: Every day | ORAL | 0 refills | Status: DC

## 2021-05-01 NOTE — Telephone Encounter (Signed)
°*  STAT* If patient is at the pharmacy, call can be transferred to refill team.   1. Which medications need to be refilled? (please list name of each medication and dose if known) atorvastatin (LIPITOR) 80 MG 1 tablet daily   2. Which pharmacy/location (including street and city if local pharmacy) is medication to be sent to? [facility] - needs to update pharm from [facility] to [facility]   3. Do they need a 30 day or 90 day supply? 90 day

## 2021-05-01 NOTE — Telephone Encounter (Signed)
Requested Prescriptions   Signed Prescriptions Disp Refills   atorvastatin (LIPITOR) 80 MG tablet 90 tablet 0    Sig: Take 1 tablet (80 mg total) by mouth daily.    Authorizing Provider: END, CHRISTOPHER    Ordering User: Raelene Bott, Steadman Prosperi L

## 2021-05-02 LAB — CBC
Hematocrit: 40.7 % (ref 34.0–46.6)
Hemoglobin: 13.9 g/dL (ref 11.1–15.9)
MCH: 30.6 pg (ref 26.6–33.0)
MCHC: 34.2 g/dL (ref 31.5–35.7)
MCV: 90 fL (ref 79–97)
Platelets: 185 10*3/uL (ref 150–450)
RBC: 4.54 x10E6/uL (ref 3.77–5.28)
RDW: 14.4 % (ref 11.7–15.4)
WBC: 9 10*3/uL (ref 3.4–10.8)

## 2021-05-02 LAB — LIPID PANEL
Chol/HDL Ratio: 3.7 ratio (ref 0.0–4.4)
Cholesterol, Total: 142 mg/dL (ref 100–199)
HDL: 38 mg/dL — ABNORMAL LOW (ref >39)
LDL Chol Calc (NIH): 75 mg/dL (ref 0–99)
Triglycerides: 172 mg/dL — ABNORMAL HIGH (ref 0–149)
VLDL Cholesterol Cal: 29 mg/dL (ref 5–40)

## 2021-05-02 LAB — COMPREHENSIVE METABOLIC PANEL
ALT: 27 IU/L (ref 0–32)
AST: 26 IU/L (ref 0–40)
Albumin/Globulin Ratio: 1.5 (ref 1.2–2.2)
Albumin: 4.3 g/dL (ref 3.8–4.8)
Alkaline Phosphatase: 143 IU/L — ABNORMAL HIGH (ref 44–121)
BUN/Creatinine Ratio: 9 — ABNORMAL LOW (ref 12–28)
BUN: 9 mg/dL (ref 8–27)
Bilirubin Total: 0.3 mg/dL (ref 0.0–1.2)
CO2: 20 mmol/L (ref 20–29)
Calcium: 9.4 mg/dL (ref 8.7–10.3)
Chloride: 106 mmol/L (ref 96–106)
Creatinine, Ser: 0.95 mg/dL (ref 0.57–1.00)
Globulin, Total: 2.9 g/dL (ref 1.5–4.5)
Glucose: 126 mg/dL — ABNORMAL HIGH (ref 70–99)
Potassium: 4 mmol/L (ref 3.5–5.2)
Sodium: 142 mmol/L (ref 134–144)
Total Protein: 7.2 g/dL (ref 6.0–8.5)
eGFR: 66 mL/min/{1.73_m2} (ref >59)

## 2021-05-02 LAB — VITAMIN D 25 HYDROXY (VIT D DEFICIENCY, FRACTURES): Vit D, 25-Hydroxy: 57.9 ng/mL (ref 30.0–100.0)

## 2021-05-08 ENCOUNTER — Telehealth: Payer: Self-pay

## 2021-05-08 NOTE — Telephone Encounter (Signed)
pt insurance changed and she needs her seroquel sent to [facility]

## 2021-05-08 NOTE — Telephone Encounter (Signed)
Will hold it for now given she would have enough meds until the next visit.

## 2021-05-09 ENCOUNTER — Other Ambulatory Visit: Payer: Self-pay | Admitting: Psychiatry

## 2021-05-09 MED ORDER — QUETIAPINE FUMARATE 100 MG PO TABS: 100.0000 mg | ORAL_TABLET | Freq: Every day | ORAL | 0 refills | Status: DC

## 2021-05-09 NOTE — Telephone Encounter (Signed)
Noted. Ordered medication to both pharmacy.

## 2021-05-09 NOTE — Telephone Encounter (Signed)
pt came into the office she states she had enough of all of them except for the seroquel. she states she must have lost it or something she doesn't know what happen.

## 2021-05-09 NOTE — Telephone Encounter (Signed)
[facility] on 10-31.  they do not have the rx that was sent in dec.  so patient would be out..  [facility]  and a 90 day sent [facility]

## 2021-05-09 NOTE — Telephone Encounter (Signed)
[facility] now [facility] trying to find out about when the patient last got the seroquel

## 2021-05-22 ENCOUNTER — Other Ambulatory Visit: Payer: Self-pay

## 2021-05-22 ENCOUNTER — Encounter: Payer: Self-pay | Admitting: Family Medicine

## 2021-05-22 ENCOUNTER — Telehealth: Payer: Self-pay

## 2021-05-22 ENCOUNTER — Ambulatory Visit (INDEPENDENT_AMBULATORY_CARE_PROVIDER_SITE_OTHER): Payer: BC Managed Care – PPO | Admitting: Family Medicine

## 2021-05-22 VITALS — BP 111/66 | HR 84 | Temp 98.3°F | Resp 16 | Ht 63.0 in | Wt 150.0 lb

## 2021-05-22 DIAGNOSIS — T148XXA Other injury of unspecified body region, initial encounter: Secondary | ICD-10-CM | POA: Diagnosis not present

## 2021-05-22 MED ORDER — NAPROXEN 500 MG PO TABS: 500.0000 mg | ORAL_TABLET | Freq: Two times a day (BID) | ORAL | 0 refills | Status: AC

## 2021-05-22 MED ORDER — CYCLOBENZAPRINE HCL 5 MG PO TABS: 5.0000 mg | ORAL_TABLET | Freq: Three times a day (TID) | ORAL | 0 refills | Status: DC | PRN

## 2021-05-22 NOTE — Telephone Encounter (Signed)
Called [facility], patient has picked up medication.   Copied from [place] [phone]. Topic: General - Other >> May 22, 2021 10:08 AM Valere Dross wrote: Reason for CRM: Pt called in stating that the pharmacy is saying they have not received the cyclobenzaprine (FLEXERIL) 5 MG tablet medication script and the pt is currently there waiting to get the medication. Please advise.

## 2021-05-22 NOTE — Progress Notes (Signed)
° °  SUBJECTIVE:   CHIEF COMPLAINT / HPI:   BACK PAIN Duration: 3 weeks after mopping Mechanism of injury:  mopping Location: upper back, L side Quality: sharp Frequency: constant Radiation: up neck Aggravating factors: movement Alleviating factors: nothing Status: worse Treatments attempted: advil, massage, rest Relief with NSAIDs?: mild Nighttime pain:  hard to go to sleep due to pain Paresthesias / decreased sensation:  no Fevers:  no   OBJECTIVE:   BP 111/66 (BP Location: Right Arm, Patient Position: Sitting, Cuff Size: Normal)    Pulse 84    Temp 98.3 F (36.8 C) (Temporal)    Resp 16    Ht 5\' 3"  (1.6 m)    Wt 150 lb (68 kg)    SpO2 95%    BMI 26.57 kg/m   Gen: well appearing, in NAD MSK: hypertonicity of upper trap muscles with TTP along R trap. AROM in flexion, L rotation limited 2/2 pain. No midline spinal tenderness, bony prominence or stepoff. Intact radial pulses bilaterally. Intact and symmetric grip strength.   ASSESSMENT/PLAN:   Muscle strain 2/2 mopping. Naproxen, prn flexeril x7 days with heat, rest. F/u if no better.    Myles Gip, DO

## 2021-05-29 ENCOUNTER — Encounter: Payer: Self-pay | Admitting: Family Medicine

## 2021-05-29 ENCOUNTER — Ambulatory Visit (INDEPENDENT_AMBULATORY_CARE_PROVIDER_SITE_OTHER): Payer: BC Managed Care – PPO | Admitting: Family Medicine

## 2021-05-29 ENCOUNTER — Other Ambulatory Visit: Payer: Self-pay

## 2021-05-29 ENCOUNTER — Ambulatory Visit: Payer: Self-pay | Admitting: *Deleted

## 2021-05-29 VITALS — BP 117/61 | HR 86 | Temp 98.3°F | Resp 16 | Wt 154.0 lb

## 2021-05-29 DIAGNOSIS — M792 Neuralgia and neuritis, unspecified: Secondary | ICD-10-CM

## 2021-05-29 MED ORDER — PREDNISONE 10 MG PO TABS: ORAL_TABLET | ORAL | 0 refills | Status: AC

## 2021-05-29 NOTE — Telephone Encounter (Signed)
Reason for Disposition  Numbness (i.e., loss of sensation) in hand or fingers  Answer Assessment - Initial Assessment Questions 1. ONSET: "When did the pain start?"     I was seen last week by Dr. Ky Barban due to back pain.   I've been doing what she told me and the medication that was prescribed.    My neck is better but still having pain over my shoulder blade. When I lay down it's worse.   I'm having pain and numbness down my left arm.    2. LOCATION: "Where is the pain located?"     Friday night the left arm pain and numbness started.  I can't sleep due to the pain.  3. PAIN: "How bad is the pain?" (Scale 1-10; or mild, moderate, severe)   - MILD (1-3): doesn't interfere with normal activities   - MODERATE (4-7): interferes with normal activities (e.g., work or school) or awakens from sleep   - SEVERE (8-10): excruciating pain, unable to do any normal activities, unable to hold a cup of water     *No Answer* 4. WORK OR EXERCISE: "Has there been any recent work or exercise that involved this part of the body?"     *No Answer* 5. CAUSE: "What do you think is causing the arm pain?"     *No Answer* 6. OTHER SYMPTOMS: "Do you have any other symptoms?" (e.g., neck pain, swelling, rash, fever, numbness, weakness)     *No Answer* 7. PREGNANCY: "Is there any chance you are pregnant?" "When was your last menstrual period?"     *No Answer*  Protocols used: Arm Pain-A-AH

## 2021-05-29 NOTE — Progress Notes (Signed)
I,Roshena L Chambers,acting as a scribe for Lelon Huh, MD.,have documented all relevant documentation on the behalf of Lelon Huh, MD,as directed by  Lelon Huh, MD while in the presence of Lelon Huh, MD.    Established patient visit   Patient: Katelyn Cooper   DOB: Aug 10, 1955   66 y.o. Female  MRN: [id number] Visit Date: 05/29/2021  Today's healthcare provider: Lelon Huh, MD   Chief Complaint  Patient presents with   Arm Pain   Subjective    Arm Pain  Incident onset: 3 days ago. Pain location: left arm. Associated symptoms include numbness (left arm). Pertinent negatives include no chest pain.   Patient was last seen in the office by Dr. Ky Barban on 05/22/2020 for back muscle strain that resulted from mopping. She was prescribed cyclobenzaprine and Naproxen. She was also advised to apply heat. Patient reports good compliance with treatment, but reports her back still hurts. She now has pain and numbness in her left arm. Pain and numbness starts in left shoulder and radiates into her forearm.   Medications: Outpatient Medications Prior to Visit  Medication Sig   acetaminophen (TYLENOL) 500 MG tablet Take 500 mg by mouth every 6 (six) hours as needed.   albuterol (VENTOLIN HFA) 108 (90 Base) MCG/ACT inhaler Ventolin HFA 90 mcg/actuation aerosol inhaler  inhale 2 puffs by mouth every 6 hours if needed for wheezing or shortness of breath   aspirin EC 81 MG tablet Take 1 tablet (81 mg total) by mouth daily.   atorvastatin (LIPITOR) 80 MG tablet Take 1 tablet (80 mg total) by mouth daily.   b complex vitamins capsule Take 1 capsule by mouth daily.   Cholecalciferol (VITAMIN D3) 5000 units TABS Take 1 tablet by mouth daily.    clobetasol ointment (TEMOVATE) 0.05 % clobetasol 0.05 % topical ointment  PLEASE SEE ATTACHED FOR DETAILED DIRECTIONS   cyclobenzaprine (FLEXERIL) 5 MG tablet Take 1 tablet (5 mg total) by mouth 3 (three) times daily as needed for muscle  spasms.   furosemide (LASIX) 20 MG tablet TAKE 1 TABLET DAILY PRN FOR INCREASED FLUID OR WEIGHT GAIN   glucose blood test strip ONETOUCH ULTRA MINI STRIPS AND LANCETS. Use one daily to check blood sugar.   lansoprazole (PREVACID) 15 MG capsule Take 15 mg by mouth daily at 6 (six) AM.   metFORMIN (GLUCOPHAGE-XR) 500 MG 24 hr tablet TAKE 1 TABLET BY MOUTH EVERY EVENING . GENERIC EQUIVALENT FOR GLUCOPHAGE-XR   metoprolol tartrate (LOPRESSOR) 25 MG tablet TAKE 1 TABLET TWICE A DAY   Multiple Vitamins-Minerals (MULTI-VITAMIN GUMMIES PO) Take by mouth. Taking 1 daily   naproxen (NAPROSYN) 500 MG tablet Take 1 tablet (500 mg total) by mouth 2 (two) times daily with a meal for 7 days.   potassium chloride SA (KLOR-CON M20) 20 MEQ tablet Take 2 tablets (40 meq) by mouth once daily only on days you are taking lasix   [START ON 06/06/2021] QUEtiapine (SEROQUEL) 100 MG tablet Take 1 tablet (100 mg total) by mouth at bedtime.   QUEtiapine (SEROQUEL) 100 MG tablet Take 1 tablet (100 mg total) by mouth at bedtime.   venlafaxine XR (EFFEXOR-XR) 75 MG 24 hr capsule Take 1 capsule (75 mg total) by mouth daily with breakfast.   zaleplon (SONATA) 10 MG capsule Take 1 capsule (10 mg total) by mouth at bedtime as needed for sleep.   No facility-administered medications prior to visit.    Review of Systems  Constitutional:  Negative for appetite  change, chills, fatigue and fever.  Respiratory:  Negative for chest tightness and shortness of breath.   Cardiovascular:  Negative for chest pain and palpitations.  Gastrointestinal:  Negative for abdominal pain, nausea and vomiting.  Musculoskeletal:  Positive for myalgias (left arm).  Neurological:  Positive for numbness (left arm). Negative for dizziness and weakness.      Objective    BP 117/61 (BP Location: Right Arm, Patient Position: Sitting, Cuff Size: Normal)    Pulse 86    Temp 98.3 F (36.8 C) (Oral)    Resp 16    Wt 154 lb (69.9 kg)    SpO2 99% Comment: room  air   BMI 27.28 kg/m    Physical Exam  Slightly tender over left lateral upper arm. No gross deformities. FROM of shoulder without pain. Point tenderness over left posterior trapezius that reproduces pain in arm    Assessment & Plan     1. Neuralgia of left upper extremity Secondary to back and shoulder strain, no relief with NSAIDs and muscle relaxer.  Advised to switch from heat pads to ice packs for the next 2 days.  - predniSONE (DELTASONE) 10 MG tablet; 6 tablets for 2 days, then 5 for 2 days, then 4 for 2 days, then 3 for 2 days, then 2 for 2 days, then 1 for 2 days.  Dispense: 42 tablet; Refill: 0       The entirety of the information documented in the History of Present Illness, Review of Systems and Physical Exam were personally obtained by me. Portions of this information were initially documented by the CMA and reviewed by me for thoroughness and accuracy.     Lelon Huh, MD  [facility] [phone] (phone) [phone] (fax)  [place]

## 2021-05-29 NOTE — Telephone Encounter (Signed)
°  Chief Complaint: left arm pain/numbness from prior back injury (Seen by Dr. Ky Barban on 2/20) Symptoms: injured upper back while mopping and the pain is now going down her left arm.   Neck is better. Frequency: Since Friday night Pertinent Negatives: Patient denies weakness in arm  Disposition: [] ED /[] Urgent Care (no appt availability in office) / [x] Appointment(In office/virtual)/ []  [place] [facility]/ [] Home Care/ [] Refused Recommended Disposition /[] [place] [facility]/ []  Follow-up with PCP Additional Notes:

## 2021-06-04 ENCOUNTER — Other Ambulatory Visit: Payer: Self-pay | Admitting: Psychiatry

## 2021-06-06 ENCOUNTER — Ambulatory Visit: Payer: Self-pay | Admitting: *Deleted

## 2021-06-06 NOTE — Telephone Encounter (Signed)
?  Chief Complaint: Burning real bad on the outside of bladder area when she urinates.    ?Symptoms: She is also having nervousness, puffy face and hands, flushed face from the prednisone but the burning and irritation in the peroneal area is the worst symptom ?Frequency: Started yesterday ?Pertinent Negatives: Patient denies abd pain, urgency or discoloration of urine.   Started with the prednisone.   Does not burn inside with urination it's all on the outside. ?Disposition: '[]'$ ED /'[]'$ Urgent Care (no appt availability in office) / '[]'$ Appointment(In office/virtual)/ '[]'$  [place] [facility]/ '[]'$ Home Care/ '[]'$ Refused Recommended Disposition /'[]'$ [place] [facility]/ '[x]'$  Follow-up with PCP ?Additional Notes: I have sent a high priority message to Dr. Caryn Section and will have someone call pt back.   She was agreeable to this plan.  ?

## 2021-06-06 NOTE — Telephone Encounter (Signed)
Reason for Disposition ? [1] Caller has URGENT medicine question about med that PCP or specialist prescribed AND [2] triager unable to answer question ? ?Answer Assessment - Initial Assessment Questions ?1. NAME of MEDICATION: "What medicine are you calling about?" ?    Prednisone ?2. QUESTION: "What is your question?" (e.g., double dose of medicine, side effect) ?    Face feels puffy and nervous, I'm flushed and yesterday and today it burns to urinate.    It's irritated down there too.    My hands are puffy too.    I take Lasix for fluid too.    ? ?Frequent urination, burning with urination,  ?3. PRESCRIBING HCP: "Who prescribed it?" Reason: if prescribed by specialist, call should be referred to that group. ?    Dr. Caryn Section ?4. SYMPTOMS: "Do you have any symptoms?" ?    See above ?5. SEVERITY: If symptoms are present, ask "Are they mild, moderate or severe?" ?    Severe burning with urination ?6. PREGNANCY:  "Is there any chance that you are pregnant?" "When was your last menstrual period?" ?    NA ? ?Protocols used: Medication Question Call-A-AH ? ?

## 2021-06-06 NOTE — Telephone Encounter (Signed)
Please review and advise. Dr. Caryn Section is out of the office this afternoon.  ?

## 2021-06-07 ENCOUNTER — Other Ambulatory Visit (HOSPITAL_COMMUNITY)
Admission: RE | Admit: 2021-06-07 | Discharge: 2021-06-07 | Disposition: A | Discharge status: Home / Self Care | Payer: BC Managed Care – PPO | Source: Ambulatory Visit | Attending: Physician Assistant | Admitting: Physician Assistant

## 2021-06-07 ENCOUNTER — Encounter: Payer: Self-pay | Admitting: Physician Assistant

## 2021-06-07 ENCOUNTER — Other Ambulatory Visit: Payer: Self-pay

## 2021-06-07 ENCOUNTER — Ambulatory Visit (INDEPENDENT_AMBULATORY_CARE_PROVIDER_SITE_OTHER): Payer: BC Managed Care – PPO | Admitting: Physician Assistant

## 2021-06-07 VITALS — BP 106/51 | HR 77 | Resp 16 | Wt 148.0 lb

## 2021-06-07 DIAGNOSIS — E1151 Type 2 diabetes mellitus with diabetic peripheral angiopathy without gangrene: Secondary | ICD-10-CM | POA: Diagnosis not present

## 2021-06-07 DIAGNOSIS — B9620 Unspecified Escherichia coli [E. coli] as the cause of diseases classified elsewhere: Secondary | ICD-10-CM | POA: Insufficient documentation

## 2021-06-07 DIAGNOSIS — N951 Menopausal and female climacteric states: Secondary | ICD-10-CM

## 2021-06-07 DIAGNOSIS — N898 Other specified noninflammatory disorders of vagina: Secondary | ICD-10-CM | POA: Diagnosis not present

## 2021-06-07 DIAGNOSIS — N760 Acute vaginitis: Secondary | ICD-10-CM | POA: Diagnosis not present

## 2021-06-07 DIAGNOSIS — B9689 Other specified bacterial agents as the cause of diseases classified elsewhere: Secondary | ICD-10-CM

## 2021-06-07 DIAGNOSIS — R39198 Other difficulties with micturition: Secondary | ICD-10-CM | POA: Insufficient documentation

## 2021-06-07 DIAGNOSIS — N342 Other urethritis: Secondary | ICD-10-CM

## 2021-06-07 LAB — POCT URINALYSIS DIPSTICK
Glucose, UA: NEGATIVE
Ketones, UA: NEGATIVE
Nitrite, UA: POSITIVE
Protein, UA: NEGATIVE
Spec Grav, UA: 1.02 (ref 1.010–1.025)
Urobilinogen, UA: NEGATIVE E.U./dL — AB
pH, UA: 6 (ref 5.0–8.0)

## 2021-06-07 NOTE — Telephone Encounter (Signed)
Patient returned call and was scheduled appt today at 2:40. Patient verbalized understanding. ?

## 2021-06-07 NOTE — Progress Notes (Signed)
Virtual Visit via Video Note  I connected with Katelyn Cooper on 06/08/21 at  1:00 PM EST by a video enabled telemedicine application and verified that I am speaking with the correct person using two identifiers.  Location: Patient: home Provider: office Persons participated in the visit- patient, provider    I discussed the limitations of evaluation and management by telemedicine and the availability of in person appointments. The patient expressed understanding and agreed to proceed.    I discussed the assessment and treatment plan with the patient. The patient was provided an opportunity to ask questions and all were answered. The patient agreed with the plan and demonstrated an understanding of the instructions.   The patient was advised to call back or seek an in-person evaluation if the symptoms worsen or if the condition fails to improve as anticipated.  I provided 10 minutes of non-face-to-face time during this encounter.   [name], MD    [facility] MD/PA/NP OP Progress Note  06/08/2021 1:41 PM Katelyn Cooper  MRN:  [id number]  Chief Complaint:  Chief Complaint  Patient presents with   Follow-up   Other   HPI:  This is a follow-up appointment for depression and insomnia.  She states that she was doing very well, and life was good until she started to take steroid for her shoulder pain.  She was feeling anxious and had insomnia.  She was able to see a provider, and she already has been tapered off.  She has been doing better since then.  She may consider injection in her shoulder as she still struggles with the pain.  She has been doing much better since being back on quetiapine.  She sleeps well until recently.  She denies change in appetite or weight.  She denies SI.  She denies anxiety except the time she was on steroid.  She feels comfortable to stay on the current medication regimen.    Visit Diagnosis:    ICD-10-CM   1. Insomnia, unspecified type  G47.00 zaleplon  (SONATA) 10 MG capsule    2. MDD (major depressive disorder), recurrent, in partial remission (HCC)  F33.41 venlafaxine XR (EFFEXOR-XR) 75 MG 24 hr capsule      Past Psychiatric History: Please see initial evaluation for full details. I have reviewed the history. No updates at this time.     Past Medical History:  Past Medical History:  Diagnosis Date   (HFpEF) heart failure with preserved ejection fraction ([place])    a. 12/2015 Echo: EF 55-60%, nl RV size/fxn, no significant valvular abnormalities; b. 10/2017 Echo: EF 50-55%, antsept, ant HK. Gr2 DD. Mild MR. Nl RV fxn. PASP 56mHg.   Anxiety    Basal cell carcinoma 08/08/2017   L nose above mid nasal alar groove   CHF (congestive heart failure) (HCC)    Depression    Diabetes type 2, controlled ([place]    diet-controlled   Esophageal stricture    GERD (gastroesophageal reflux disease)    Hiatal hernia    History of chicken pox    Hyperlipidemia    Hypertension    Internal hemorrhoids    Non-obstructive CAD (coronary artery disease)    a. 12/2015 MV: apical ant and apical reversible defect. EF 55-65%; b. 01/2016 Cath: LM nl, LAD nl, SP1 40ost, LCX nl, OM1 30, RCA 30p/m, EDP 15-217mg; c. 10/2017 Cath: LM nl, LAD nl, SP1 30ost, LCX nl, OM1 30, RCA 30p/m. EF 55%.   Obstructive sleep apnea    PAD (  peripheral artery disease) ([place])    a. 1990's s/p prior LCIA stenting; b. 12/2015 ABI: R 1.05, L 1.03.   Panic disorder     Past Surgical History:  Procedure Laterality Date   AORTA SURGERY  1999   stent placement; ? left iliac artery intervention   APPENDECTOMY  1998   BASAL CELL CARCINOMA EXCISION     CARDIAC CATHETERIZATION N/A 01/12/2016   Procedure: Left Heart Cath and Coronary Angiography;  Surgeon: Nelva Bush, MD;  Location: [facility] [facility];  Service: Cardiovascular;  Laterality: N/A;   COLONOSCOPY  07/17/2019   FOOT SURGERY     Right   pap smear  03/2010   Done by [name], normal   REFRACTIVE SURGERY      right   RIB RESECTION  [id number]   RIGHT/LEFT HEART CATH AND CORONARY ANGIOGRAPHY N/A 11/08/2017   Procedure: RIGHT/LEFT HEART CATH AND CORONARY ANGIOGRAPHY;  Surgeon: Wellington Hampshire, MD;  Location: [place] [facility];  Service: Cardiovascular;  Laterality: N/A;   SHOULDER SURGERY  2005   left   UPPER GASTROINTESTINAL ENDOSCOPY  07/17/2019   UPPER GI ENDOSCOPY  09/19/2011   Stricture at GE junction, dilated. Mild gastritis and duodenitis. Small hiatal hernia    Family Psychiatric History: Please see initial evaluation for full details. I have reviewed the history. No updates at this time.     Family History:  Family History  Problem Relation Age of Onset   Lung cancer Mother        was a smoker   Emphysema Mother    Cancer Mother        Lung   Alcohol abuse Mother    Depression Mother    Heart disease Mother    Drug abuse Sister    Breast cancer Sister        x 2  (30&50)   Emphysema Sister    Bipolar disorder Sister    Alcohol abuse Other    Depression Other    Bipolar disorder Other    Heart disease Other    Vision loss Other    Alcohol abuse Father    Mood Disorder Father    Heart disease Maternal Grandmother    Stroke Maternal Grandmother    Esophageal cancer Maternal Aunt    Colon cancer Neg Hx    Colon polyps Neg Hx    Rectal cancer Neg Hx    Stomach cancer Neg Hx     Social History:  Social History   Socioeconomic History   Marital status: Married    Spouse name: mark   Number of children: 0   Years of education: Not on file   Highest education level: High school graduate  Occupational History   Occupation: computer-retired    Employer: [employer]  Tobacco Use   Smoking status: Every Day    Packs/day: 1.00    Years: 40.00    Pack years: 40.00    Types: Cigarettes    Start date: 03/01/1975    Last attempt to quit: 08/11/2017    Years since quitting: 3.8   Smokeless tobacco: Never   Tobacco comments:    0.75PPD 10/11/2020  Vaping Use   Vaping  Use: Former  Substance and Sexual Activity   Alcohol use: No    Alcohol/week: 0.0 standard drinks   Drug use: No   Sexual activity: Not Currently  Other Topics Concern   Not on file  Social History Narrative   Not on file  Social Determinants of Health   Financial Resource Strain: Not on file  Food Insecurity: Not on file  Transportation Needs: Not on file  Physical Activity: Not on file  Stress: Not on file  Social Connections: Not on file    Allergies:  Allergies  Allergen Reactions   Lovastatin     depression   Paroxetine Hcl     itching   Lamotrigine Rash    Metabolic Disorder Labs: Lab Results  Component Value Date   HGBA1C 6.6 (A) 04/24/2021   No results found for: PROLACTIN Lab Results  Component Value Date   CHOL 142 05/01/2021   TRIG 172 (H) 05/01/2021   HDL 38 (L) 05/01/2021   CHOLHDL 3.7 05/01/2021   VLDL 41 (H) 03/14/2020   LDLCALC 75 05/01/2021   LDLCALC 53 03/14/2020   Lab Results  Component Value Date   TSH 1.660 06/02/2020   TSH 1.550 04/17/2019    Therapeutic Level Labs: No results found for: LITHIUM No results found for: VALPROATE No components found for:  CBMZ  Current Medications: Current Outpatient Medications  Medication Sig Dispense Refill   acetaminophen (TYLENOL) 500 MG tablet Take 500 mg by mouth every 6 (six) hours as needed.     albuterol (VENTOLIN HFA) 108 (90 Base) MCG/ACT inhaler Ventolin HFA 90 mcg/actuation aerosol inhaler  inhale 2 puffs by mouth every 6 hours if needed for wheezing or shortness of breath     aspirin EC 81 MG tablet Take 1 tablet (81 mg total) by mouth daily.     atorvastatin (LIPITOR) 80 MG tablet Take 1 tablet (80 mg total) by mouth daily. 90 tablet 0   b complex vitamins capsule Take 1 capsule by mouth daily.     Cholecalciferol (VITAMIN D3) 5000 units TABS Take 1 tablet by mouth daily.   0   clobetasol ointment (TEMOVATE) 0.05 % clobetasol 0.05 % topical ointment  PLEASE SEE ATTACHED FOR  DETAILED DIRECTIONS     cyclobenzaprine (FLEXERIL) 5 MG tablet Take 1 tablet (5 mg total) by mouth 3 (three) times daily as needed for muscle spasms. 30 tablet 0   furosemide (LASIX) 20 MG tablet TAKE 1 TABLET DAILY PRN FOR INCREASED FLUID OR WEIGHT GAIN 90 tablet 0   glucose blood test strip ONETOUCH ULTRA MINI STRIPS AND LANCETS. Use one daily to check blood sugar. 100 each 3   lansoprazole (PREVACID) 15 MG capsule Take 15 mg by mouth daily at 6 (six) AM.     metFORMIN (GLUCOPHAGE-XR) 500 MG 24 hr tablet TAKE 1 TABLET BY MOUTH EVERY EVENING . GENERIC EQUIVALENT FOR GLUCOPHAGE-XR 90 tablet 4   metoprolol tartrate (LOPRESSOR) 25 MG tablet TAKE 1 TABLET TWICE A DAY 180 tablet 1   Multiple Vitamins-Minerals (MULTI-VITAMIN GUMMIES PO) Take by mouth. Taking 1 daily     potassium chloride SA (KLOR-CON M20) 20 MEQ tablet Take 2 tablets (40 meq) by mouth once daily only on days you are taking lasix 90 tablet 0   predniSONE (DELTASONE) 10 MG tablet 6 tablets for 2 days, then 5 for 2 days, then 4 for 2 days, then 3 for 2 days, then 2 for 2 days, then 1 for 2 days. 42 tablet 0   QUEtiapine (SEROQUEL) 100 MG tablet Take 1 tablet (100 mg total) by mouth at bedtime. 90 tablet 0   QUEtiapine (SEROQUEL) 100 MG tablet Take 1 tablet (100 mg total) by mouth at bedtime. 30 tablet 0   [START ON 07/05/2021] venlafaxine XR (EFFEXOR-XR) 75 MG  24 hr capsule Take 1 capsule (75 mg total) by mouth daily with breakfast. 90 capsule 0   [START ON 07/02/2021] zaleplon (SONATA) 10 MG capsule Take 1 capsule (10 mg total) by mouth at bedtime as needed for sleep. 30 capsule 2   No current facility-administered medications for this visit.     Musculoskeletal: Strength & Muscle Tone:  N/A Gait & Station:  N/A Patient leans: N/A  Psychiatric Specialty Exam: Review of Systems  Psychiatric/Behavioral:  Positive for sleep disturbance. Negative for agitation, behavioral problems, confusion, decreased concentration, dysphoric mood,  hallucinations, self-injury and suicidal ideas. The patient is nervous/anxious. The patient is not hyperactive.   All other systems reviewed and are negative.  There were no vitals taken for this visit.There is no height or weight on file to calculate BMI.  General Appearance: Fairly Groomed  Eye Contact:  Good  Speech:  Clear and Coherent  Volume:  Normal  Mood:   was doing good  Affect:  Appropriate, Congruent, and euthymic  Thought Process:  Coherent  Orientation:  Full (Time, Place, and Person)  Thought Content: Logical   Suicidal Thoughts:  No  Homicidal Thoughts:  No  Memory:  Immediate;   Good  Judgement:  Good  Insight:  Good  Psychomotor Activity:  Normal  Concentration:  Concentration: Good and Attention Span: Good  Recall:  Good  Fund of Knowledge: Good  Language: Good  Akathisia:  No  Handed:  Right  AIMS (if indicated): not done  Assets:  Communication Skills Desire for Improvement  ADL's:  Intact  Cognition: WNL  Sleep:  Fair   Screenings: [place] Office Visit from 03/10/2018 in [place] Total Score 0      PHQ2-9    [place] Office Visit from 04/24/2021 in [facility] Video Visit from 03/14/2021 in [place] Office Visit from 01/16/2021 in [place] Video Visit from 10/05/2020 in [place] Office Visit from 04/27/2020 in [place]  PHQ-2 Total Score 0 0 0 0 1  PHQ-9 Total Score 1 -- -- -- 1      Flowsheet Row Video Visit from 03/14/2021 in [place] Video Visit from 10/05/2020 in [place] No Risk No Risk        Assessment and Plan:  Tamecca Artiga is a 66 y.o. year old female with a history of PTSD, depression, nonobstructive CAD, PAD status post left iliac stent, chronic HFpEF,  diet-controlled DM2, thoracic outlet syndrome status post bilateral rib resections (6803), GERD complicated by esophageal stricture, HLD, who presents for follow up appointment for below.    2. MDD (major depressive disorder), recurrent, in partial remission ([place]) She denies significant mood symptoms except recent anxiety in the context of taking steroid for her shoulder pain.  She has been doing well otherwise since switching back to quetiapine. She reports good support from her husband, and maintains good connection with her friend.  Will continue current dose of venlafaxine and quetiapine to target depression. Noted that although she was diagnosed with bipolar 1 disorder in the past, she denies any significant manic symptoms except irritability.  Will continue to monitor.   1. Insomnia, unspecified type She reports good benefit from [place].  We will continue current dose to target insomnia.    Plan Continue venlafaxine 75 mg daily Continue quetiapine 100 mg at night (on metformin) Continue  Sonata 10 mg at night  Next appointment - 6/6 at 1:20 for 20 mins, video - on Vagifem 10 mg, twice a week, inseritble   Past trials of medication-  Paxil, mirtazapine, Latuda (insomnia), Abilify, Lamotrigine, Gabapentin   The patient demonstrates the following risk factors for suicide: Chronic risk factors for suicide include: psychiatric disorder of depression . Acute risk factors for suicide include: N/A. Protective factors for this patient include: positive social support, responsibility to others (children, family), coping skills, and hope for the future. Considering these factors, the overall suicide risk at this point appears to be low. Patient is appropriate for outpatient follow up.         Collaboration of Care: Collaboration of Care: Other N/A  Consent: Patient/Guardian gives verbal consent for treatment and assignment of benefits for services provided during this visit. Patient/Guardian  expressed understanding and agreed to proceed.    [name], MD 06/08/2021, 1:41 PM

## 2021-06-07 NOTE — Progress Notes (Signed)
?  ? ? ?Established patient visit ? ? ?Patient: Katelyn Cooper   DOB: 10/30/1955   66 y.o. Female  MRN: [id number] ?Visit Date: 06/07/2021 ? ?Today's healthcare provider: Mardene Speak, PA-C  ? ?Chief Complaint  ?Patient presents with  ? Vaginal Pain  ? ?Subjective  ?  ? ?Vaginal Pain ? ?Pt reports vaginal burning; onset 3 days ago. Reports having redness, swollen, inflamed, "hot" area around the ureter. The pain is severe.  She is not sexually active. She uses nothing for contraception. She is postmenopausal. She is not pregnant.  ?The patient's pertinent negatives include no genital itching, genital lesions, genital odor, genital rash, missed menses, pelvic pain, vaginal bleeding or vaginal discharge.  "Water in the bath or shower "aggravates the symptoms.  ?Pt has been applying Vaseline with some relief. ? ?Medications: ?Outpatient Medications Prior to Visit  ?Medication Sig  ? acetaminophen (TYLENOL) 500 MG tablet Take 500 mg by mouth every 6 (six) hours as needed.  ? albuterol (VENTOLIN HFA) 108 (90 Base) MCG/ACT inhaler Ventolin HFA 90 mcg/actuation aerosol inhaler ? inhale 2 puffs by mouth every 6 hours if needed for wheezing or shortness of breath  ? aspirin EC 81 MG tablet Take 1 tablet (81 mg total) by mouth daily.  ? atorvastatin (LIPITOR) 80 MG tablet Take 1 tablet (80 mg total) by mouth daily.  ? b complex vitamins capsule Take 1 capsule by mouth daily.  ? Cholecalciferol (VITAMIN D3) 5000 units TABS Take 1 tablet by mouth daily.   ? clobetasol ointment (TEMOVATE) 0.05 % clobetasol 0.05 % topical ointment ? PLEASE SEE ATTACHED FOR DETAILED DIRECTIONS  ? cyclobenzaprine (FLEXERIL) 5 MG tablet Take 1 tablet (5 mg total) by mouth 3 (three) times daily as needed for muscle spasms.  ? furosemide (LASIX) 20 MG tablet TAKE 1 TABLET DAILY PRN FOR INCREASED FLUID OR WEIGHT GAIN  ? glucose blood test strip ONETOUCH ULTRA MINI STRIPS AND LANCETS. Use one daily to check blood sugar.  ? lansoprazole (PREVACID) 15 MG  capsule Take 15 mg by mouth daily at 6 (six) AM.  ? metFORMIN (GLUCOPHAGE-XR) 500 MG 24 hr tablet TAKE 1 TABLET BY MOUTH EVERY EVENING . GENERIC EQUIVALENT FOR GLUCOPHAGE-XR  ? metoprolol tartrate (LOPRESSOR) 25 MG tablet TAKE 1 TABLET TWICE A DAY  ? Multiple Vitamins-Minerals (MULTI-VITAMIN GUMMIES PO) Take by mouth. Taking 1 daily  ? potassium chloride SA (KLOR-CON M20) 20 MEQ tablet Take 2 tablets (40 meq) by mouth once daily only on days you are taking lasix  ? predniSONE (DELTASONE) 10 MG tablet 6 tablets for 2 days, then 5 for 2 days, then 4 for 2 days, then 3 for 2 days, then 2 for 2 days, then 1 for 2 days.  ? QUEtiapine (SEROQUEL) 100 MG tablet Take 1 tablet (100 mg total) by mouth at bedtime.  ? QUEtiapine (SEROQUEL) 100 MG tablet Take 1 tablet (100 mg total) by mouth at bedtime.  ? venlafaxine XR (EFFEXOR-XR) 75 MG 24 hr capsule Take 1 capsule (75 mg total) by mouth daily with breakfast.  ? zaleplon (SONATA) 10 MG capsule Take 1 capsule (10 mg total) by mouth at bedtime as needed for sleep.  ? ?No facility-administered medications prior to visit.  ? ? ?Review of Systems  ?Constitutional:  Negative for chills and fever.  ?HENT:  Negative for sore throat.   ?Gastrointestinal:  Negative for abdominal pain, constipation, diarrhea, nausea and vomiting.  ?Genitourinary:  Positive for difficulty urinating, genital sores and vaginal pain. Negative  for dyspareunia, dysuria, flank pain, frequency, hematuria, pelvic pain, urgency and vaginal discharge.  ?Musculoskeletal:  Negative for back pain and joint swelling.  ?Skin:  Negative for rash.  ?Neurological:  Negative for headaches.  ? ?  Objective  ?  ?BP (!) 106/51 (BP Location: Right Arm, Patient Position: Sitting, Cuff Size: Normal)   Pulse 77   Resp 16   Wt 148 lb (67.1 kg)   SpO2 100%   BMI 26.22 kg/m?  ? ? ?Physical Exam ?Vitals and nursing note reviewed.  ?Constitutional:   ?   Appearance: Normal appearance.  ?HENT:  ?   Head: Normocephalic and  atraumatic.  ?Cardiovascular:  ?   Rate and Rhythm: Normal rate and regular rhythm.  ?   Pulses: Normal pulses.  ?   Heart sounds: Normal heart sounds.  ?Pulmonary:  ?   Effort: Pulmonary effort is normal.  ?Abdominal:  ?   General: Abdomen is flat. Bowel sounds are normal. There is no distension.  ?   Palpations: Abdomen is soft.  ?   Tenderness: There is no abdominal tenderness. There is no right CVA tenderness, left CVA tenderness or guarding.  ?Genitourinary: ?   Vagina: Vaginal discharge (thick, sticky, greenish-white) present.  ?   Rectum: Normal.  ?Neurological:  ?   Mental Status: She is alert.  ?Psychiatric:     ?   Behavior: Behavior normal.     ?   Thought Content: Thought content normal.     ?   Judgment: Judgment normal.  ?  ? Assessment & Plan  ?  ?1. Acute urethritis ?Based on PE and HPI ?- POCT Urinalysis Dipstick positive for nitrites and leukocytes ?- Urine Culture ?- Urine cytology ancillary only ? ?2. Acute vulvovaginitis ?Based on PE and HPI ?- Urinalysis, microscopic only ?- Urine Culture ?- Urine cytology ancillary only ?- POCT Urinalysis Dipstick positive for infection ?  ?Follow-up in 1 to 2 weeks ?The patient was advised to call back or seek an in-person evaluation if the symptoms worsen or if the condition fails to improve as anticipated. ? ?I discussed the assessment and treatment plan with the patient. The patient was provided an opportunity to ask questions and all were answered. The patient agreed with the plan and demonstrated an understanding of the instructions. ? ?The entirety of the information documented in the History of Present Illness, Review of Systems and Physical Exam were personally obtained by me. Portions of this information were initially documented by the CMA and reviewed by me for thoroughness and accuracy.   ? ?Mardene Speak, PA-C  ?[place] ?[phone] (phone) ?[phone] (fax) ? ?[facility]  ?

## 2021-06-07 NOTE — Telephone Encounter (Signed)
Lmtcb okay for [facility] nurse triage to schedule with available APP today. KW ?

## 2021-06-07 NOTE — Telephone Encounter (Signed)
Patient returned call and scheduled appt today at 2:40 pm. ?

## 2021-06-08 ENCOUNTER — Telehealth (INDEPENDENT_AMBULATORY_CARE_PROVIDER_SITE_OTHER): Payer: BC Managed Care – PPO | Admitting: Psychiatry

## 2021-06-08 ENCOUNTER — Encounter: Payer: Self-pay | Admitting: Psychiatry

## 2021-06-08 ENCOUNTER — Other Ambulatory Visit: Payer: Self-pay

## 2021-06-08 DIAGNOSIS — G4700 Insomnia, unspecified: Secondary | ICD-10-CM

## 2021-06-08 DIAGNOSIS — F3341 Major depressive disorder, recurrent, in partial remission: Secondary | ICD-10-CM | POA: Diagnosis not present

## 2021-06-08 DIAGNOSIS — N760 Acute vaginitis: Secondary | ICD-10-CM | POA: Diagnosis not present

## 2021-06-08 MED ORDER — ZALEPLON 10 MG PO CAPS: 10.0000 mg | ORAL_CAPSULE | Freq: Every evening | ORAL | 2 refills | Status: DC | PRN

## 2021-06-08 MED ORDER — VENLAFAXINE HCL ER 75 MG PO CP24: 75.0000 mg | ORAL_CAPSULE | Freq: Every day | ORAL | 0 refills | Status: DC

## 2021-06-08 NOTE — Patient Instructions (Signed)
Continue venlafaxine 75 mg daily ?Continue quetiapine 100 mg at night  ?Continue Sonata 10 mg at night  ?Next appointment - 6/6 at 1:20  ?

## 2021-06-09 ENCOUNTER — Telehealth: Payer: Self-pay

## 2021-06-09 ENCOUNTER — Encounter: Payer: Self-pay | Admitting: *Deleted

## 2021-06-09 LAB — URINALYSIS, MICROSCOPIC ONLY
Casts: NONE SEEN /lpf
RBC, Urine: NONE SEEN /hpf (ref 0–2)

## 2021-06-09 LAB — URINE CYTOLOGY ANCILLARY ONLY
Candida Urine: NEGATIVE — AB
Candida Urine: POSITIVE — AB
Chlamydia: NEGATIVE
Comment: NEGATIVE
Comment: NORMAL
Neisseria Gonorrhea: NEGATIVE

## 2021-06-09 NOTE — Telephone Encounter (Signed)
Called and spoke with patient and advised her that we do not have results back or urine culture or urine cytology. I do see in computer we have report back for urinalysis microscopic which showed elevated WBC and bacteria. Patient reports still dysuria and vulva burning/discomfort. KW ?

## 2021-06-09 NOTE — Telephone Encounter (Signed)
Copied from [place] [id number]. Topic: General - Other ?>> Jun 09, 2021 10:34 AM Alanda Slim E wrote: ?Reason for CRM: Pt called to speak with someone about her urine result and if any medications were called into the pharmacy / please advise / pt was misinformed that meds were called in ?

## 2021-06-09 NOTE — Telephone Encounter (Signed)
Return call from patient to see if any medication can help with c/o severe pain urinating and reviewed recent message from K. Wolford CMA again. Recommended patient go to [facility] / ED if symptoms worsen. Patient requesting a call back.  ?

## 2021-06-09 NOTE — Telephone Encounter (Signed)
I accidentally forwarded message to patients PCP, I looked back in chart and I do see we have now the results of urine cytology which was positive for candida, urine culture results are still not back. Please advise. KW ?

## 2021-06-09 NOTE — Telephone Encounter (Signed)
This encounter was created in error - please disregard.

## 2021-06-12 ENCOUNTER — Other Ambulatory Visit: Payer: Self-pay | Admitting: Physician Assistant

## 2021-06-12 DIAGNOSIS — N390 Urinary tract infection, site not specified: Secondary | ICD-10-CM

## 2021-06-12 MED ORDER — AMOXICILLIN-POT CLAVULANATE 875-125 MG PO TABS: 1.0000 | ORAL_TABLET | Freq: Two times a day (BID) | ORAL | 0 refills | Status: DC

## 2021-06-12 NOTE — Telephone Encounter (Signed)
Called and spoke with [facility] representative who states that final results of urine culture are still pending and only available is preliminary report. Final results can take up to 24-48hrs. Please advise. KW ?

## 2021-06-12 NOTE — Progress Notes (Signed)
Per agreement with pt, Rx empiric antibiotic for E.coli UTI/urethritis? ?

## 2021-06-13 LAB — URINE CULTURE

## 2021-06-13 NOTE — Telephone Encounter (Signed)
Patient notified she states that she picked up prescription on 06/12/21. KW ?

## 2021-06-14 ENCOUNTER — Ambulatory Visit: Payer: BC Managed Care – PPO | Admitting: Physician Assistant

## 2021-06-14 ENCOUNTER — Ambulatory Visit: Payer: Self-pay | Admitting: *Deleted

## 2021-06-14 MED ORDER — FLUCONAZOLE 150 MG PO TABS: 150.0000 mg | ORAL_TABLET | Freq: Once | ORAL | 0 refills | Status: AC

## 2021-06-14 NOTE — Telephone Encounter (Signed)
?  Chief Complaint: itching in vulvar area- presently taking antibiotic ?Symptoms: itching, sensitivity ?Frequency: started yesterday ?Pertinent Negatives: Patient denies discharge ?Disposition: '[]'$ ED /'[]'$ Urgent Care (no appt availability in office) / '[]'$ Appointment(In office/virtual)/ '[]'$  [facility]/ '[]'$ Home Care/ '[]'$ Refused Recommended Disposition /'[]'$ [place] [facility]/ '[x]'$  Follow-up with PCP ?Additional Notes: Patient is under treatment for for UTI and reports her symptoms are greatly improving- but she has developed itching at urethra and vaginal area. Patient thinks she may have developed yeast due to antibiotic therapy. Requesting medication- will come to office if needed. [facility]. Please let her know if medication can be prescribed to help with this- she did not sleep well last night. ?

## 2021-06-14 NOTE — Addendum Note (Signed)
Addended by: [name] on: 06/14/2021 02:44 PM ? ? Modules accepted: Orders ? ?

## 2021-06-14 NOTE — Telephone Encounter (Signed)
Summary: vaginal burning and some itching.  ? Pt stated still experiencing some vaginal burning and some itching.   ? ?Pt stated she is on her third day of antibiotics.   ?Seeking clinical advice.   ?  ? ?Reason for Disposition ? MODERATE-SEVERE itching (i.e., interferes with school, work, or sleep) ? ?Answer Assessment - Initial Assessment Questions ?1. SYMPTOM: "What's the main symptom you're concerned about?" (e.g., pain, itching, dryness) ?    All in area- urethra and vaginal area ?2. LOCATION: "Where is the  itching located?" (e.g., inside/outside, left/right) ?    Patient is using some Vaseline at urethra ?3. ONSET: "When did the  itching  start?" ?    yesterday ?4. PAIN: "Is there any pain?" If Yes, ask: "How bad is it?" (Scale: 1-10; mild, moderate, severe) ?    Pain is better- some pain with urination still ?5. ITCHING: "Is there any itching?" If Yes, ask: "How bad is it?" (Scale: 1-10; mild, moderate, severe) ?    moderate ?6. CAUSE: "What do you think is causing the discharge?" "Have you had the same problem before? What happened then?" ?    Possible yeast from antibiotic, area is sensitive from infection ?7. OTHER SYMPTOMS: "Do you have any other symptoms?" (e.g., fever, itching, vaginal bleeding, pain with urination, injury to genital area, vaginal foreign body) ?    Antibiotics are working well- but have developed yeast ?8. PREGNANCY: "Is there any chance you are pregnant?" "When was your last menstrual period?" ? ?Protocols used: Vaginal Symptoms-A-AH ? ?

## 2021-06-14 NOTE — Telephone Encounter (Signed)
[facility] ?

## 2021-06-14 NOTE — Telephone Encounter (Signed)
Patient advised.

## 2021-07-04 ENCOUNTER — Other Ambulatory Visit: Payer: Self-pay

## 2021-07-04 ENCOUNTER — Telehealth: Payer: Self-pay | Admitting: Internal Medicine

## 2021-07-04 MED ORDER — POTASSIUM CHLORIDE CRYS ER 20 MEQ PO TBCR: EXTENDED_RELEASE_TABLET | ORAL | 0 refills | Status: DC

## 2021-07-04 MED ORDER — ATORVASTATIN CALCIUM 80 MG PO TABS: 80.0000 mg | ORAL_TABLET | Freq: Every day | ORAL | 0 refills | Status: DC

## 2021-07-04 MED ORDER — FUROSEMIDE 20 MG PO TABS: ORAL_TABLET | ORAL | 0 refills | Status: DC

## 2021-07-04 NOTE — Telephone Encounter (Signed)
?*  STAT* If patient is at the pharmacy, call can be transferred to refill team. ? ? ?1. Which medications need to be refilled? (please list name of each medication and dose if known)  new prescriptions for  Atorvastatin, Furosemide and  Potassium Chloride ? ?2. Which pharmacy/location (including street and city if local pharmacy) is medication to be sent to? [facility] ? ?3. Do they need a 30 day or 90 day supply? 90 days and refills ? ?

## 2021-07-04 NOTE — Telephone Encounter (Signed)
Requested Prescriptions  ? ?Signed Prescriptions Disp Refills  ? atorvastatin (LIPITOR) 80 MG tablet 90 tablet 0  ?  Sig: Take 1 tablet (80 mg total) by mouth daily.  ?  Authorizing Provider: END, CHRISTOPHER  ?  Ordering User: Janan Ridge  ? furosemide (LASIX) 20 MG tablet 90 tablet 0  ?  Sig: TAKE 1 TABLET DAILY PRN FOR INCREASED FLUID OR WEIGHT GAIN  ?  Authorizing Provider: END, CHRISTOPHER  ?  Ordering User: Janan Ridge  ? potassium chloride SA (KLOR-CON M20) 20 MEQ tablet 90 tablet 0  ?  Sig: Take 2 tablets (40 meq) by mouth once daily only on days you are taking lasix  ?  Authorizing Provider: END, CHRISTOPHER  ?  Ordering User: Janan Ridge  ? ?

## 2021-08-01 ENCOUNTER — Other Ambulatory Visit: Payer: Self-pay | Admitting: Internal Medicine

## 2021-08-10 ENCOUNTER — Encounter: Payer: Self-pay | Admitting: Physician Assistant

## 2021-08-10 ENCOUNTER — Ambulatory Visit (INDEPENDENT_AMBULATORY_CARE_PROVIDER_SITE_OTHER): Payer: BC Managed Care – PPO | Admitting: Physician Assistant

## 2021-08-10 VITALS — BP 114/73 | HR 80 | Temp 97.8°F | Resp 16 | Wt 149.6 lb

## 2021-08-10 DIAGNOSIS — M25512 Pain in left shoulder: Secondary | ICD-10-CM

## 2021-08-10 MED ORDER — CYCLOBENZAPRINE HCL 5 MG PO TABS: 5.0000 mg | ORAL_TABLET | Freq: Three times a day (TID) | ORAL | 0 refills | Status: DC | PRN

## 2021-08-10 MED ORDER — MELOXICAM 7.5 MG PO TABS: 7.5000 mg | ORAL_TABLET | Freq: Every day | ORAL | 0 refills | Status: DC

## 2021-08-10 NOTE — Patient Instructions (Addendum)
I think you may have a muscle strain in your shoulder and upper back ?For this I recommend we start the following: ? ?Cyclobenzaprine 5 mg to be taken by mouth up to three times per day for pain and muscle tension. I recommend you take these at night to help with sleep and nighttime soreness as they can cause fatigue ? ?Meloxicam 7.5 mg to be taken by mouth once per day. This will take the place of  NSAIDs such as Ibuprofen so if you need further pain/discomfort relief take Tylenol instead ? ?I have placed a referral to Physical Therapy to see if they can help keep this from being a recurrent issue ?The referral team should call you shortly to help schedule your apt.  ? ?I also recommend the following to further provide relief  ?Rest ?Gentle massage to the area as tolerated  ?Warm compresses to the area (20 minutes on, minimum of 30 minutes off) ?Gentle stretches and exercises that I have included in your paperwork ?Try to reduce excess strain to the area and rest as much as possible  ?Wear supportive shoes and, if you must lift anything, use proper lifting techniques that spare your back.  ? ?If these measures do not lead to improvement in your symptoms over the next 2-4 weeks please let us know    ?

## 2021-08-10 NOTE — Progress Notes (Signed)
?  ? ?I,Joseline E Rosas,acting as a scribe for [employer], PA-C.,have documented all relevant documentation on the behalf of [place], PA-C,as directed by  [employer], PA-C while in the presence of Arnaldo Heffron E Arelis Neumeier, PA-C.  ? ?Established patient visit ? ? ?Patient: Katelyn Cooper   DOB: 07/16/1955   66 y.o. Female  MRN: [id number] ?Visit Date: 08/10/2021 ? ?Today's healthcare provider: Dani Gobble Sumie Remsen, PA-C  ?Introduced myself to the patient as a Journalist, newspaper and provided education on APPs in clinical practice.  ? ? ?Chief Complaint  ?Patient presents with  ? Back Pain  ? Shoulder Pain  ? ?Subjective  ?  ?Back Pain ?This is a new problem. The current episode started in the past 7 days. The problem occurs constantly. The problem has been gradually worsening since onset. Pain location: mid section by the shoulder blades. The quality of the pain is described as aching. Radiates to: to Left Shoulder. The pain is moderate. The symptoms are aggravated by position, sitting and standing (movement). Associated symptoms include numbness and tingling. Pertinent negatives include no abdominal pain, chest pain, fever, leg pain, pelvic pain, perianal numbness or weakness. She has tried heat (massage heat) for the symptoms.  ?Shoulder Pain  ?The pain is present in the left shoulder. This is a recurrent problem. The current episode started more than 1 month ago. There has been no history of extremity trauma. The problem occurs constantly. The problem has been gradually worsening. The quality of the pain is described as aching (very painful). The pain is moderate. Associated symptoms include an inability to bear weight, a limited range of motion, numbness, stiffness and tingling. Pertinent negatives include no fever, joint locking or joint swelling. She has tried rest, heat and acetaminophen (Flexeril) for the symptoms.   ? ?Reports her  left shoulder is hurting along shoulder blade- radiating upward to collar bone ?States pain and  tension in shoulder has become severe to the point of causing numbness in left arm ? ?Started on Sat- mostly along shoulder blade but then "started creeping up along that muscle" ? States she has tried her muscle relaxer along with massage chair - provides mild relief to allow basic activity ?States yesterday and today pain has become worse ?Reports using [place] and [place] but limited relief ?Pain level 10/10 - constant pain ?States pain goes from neck to mid forearm  ?Denies injury or trauma to the area  ? ?States the last time this happened was after she tried to mop her house and similar pain started. ? ?She does not report repetitive activity or work with the arms/ back ?Alleviating: nothing really ?Aggravating: laying down on her back ? ? ? ? ? ?Medications: ?Outpatient Medications Prior to Visit  ?Medication Sig  ? acetaminophen (TYLENOL) 500 MG tablet Take 500 mg by mouth every 6 (six) hours as needed.  ? albuterol (VENTOLIN HFA) 108 (90 Base) MCG/ACT inhaler Ventolin HFA 90 mcg/actuation aerosol inhaler ? inhale 2 puffs by mouth every 6 hours if needed for wheezing or shortness of breath  ? amoxicillin-clavulanate (AUGMENTIN) 875-125 MG tablet Take 1 tablet by mouth 2 (two) times daily.  ? aspirin EC 81 MG tablet Take 1 tablet (81 mg total) by mouth daily.  ? atorvastatin (LIPITOR) 80 MG tablet Take 1 tablet (80 mg total) by mouth daily.  ? b complex vitamins capsule Take 1 capsule by mouth daily.  ? Cholecalciferol (VITAMIN D3) 5000 units TABS Take 1 tablet by mouth  daily.   ? clobetasol ointment (TEMOVATE) 0.05 % clobetasol 0.05 % topical ointment ? PLEASE SEE ATTACHED FOR DETAILED DIRECTIONS  ? cyclobenzaprine (FLEXERIL) 5 MG tablet Take 1 tablet (5 mg total) by mouth 3 (three) times daily as needed for muscle spasms.  ? furosemide (LASIX) 20 MG tablet TAKE 1 TABLET DAILY PRN FOR INCREASED FLUID OR WEIGHT GAIN  ? glucose blood test strip ONETOUCH ULTRA MINI STRIPS AND LANCETS. Use one daily to check  blood sugar.  ? lansoprazole (PREVACID) 15 MG capsule Take 15 mg by mouth daily at 6 (six) AM.  ? metFORMIN (GLUCOPHAGE-XR) 500 MG 24 hr tablet TAKE 1 TABLET BY MOUTH EVERY EVENING . GENERIC EQUIVALENT FOR GLUCOPHAGE-XR  ? metoprolol tartrate (LOPRESSOR) 25 MG tablet TAKE 1 TABLET TWICE A DAY  ? Multiple Vitamins-Minerals (MULTI-VITAMIN GUMMIES PO) Take by mouth. Taking 1 daily  ? potassium chloride SA (KLOR-CON M20) 20 MEQ tablet Take 2 tablets (40 meq) by mouth once daily only on days you are taking lasix  ? QUEtiapine (SEROQUEL) 100 MG tablet Take 1 tablet (100 mg total) by mouth at bedtime.  ? venlafaxine XR (EFFEXOR-XR) 75 MG 24 hr capsule Take 1 capsule (75 mg total) by mouth daily with breakfast.  ? zaleplon (SONATA) 10 MG capsule Take 1 capsule (10 mg total) by mouth at bedtime as needed for sleep.  ? QUEtiapine (SEROQUEL) 100 MG tablet Take 1 tablet (100 mg total) by mouth at bedtime.  ? ?No facility-administered medications prior to visit.  ? ? ?Review of Systems  ?Constitutional:  Negative for fever.  ?Respiratory:  Negative for chest tightness, shortness of breath and wheezing.   ?Cardiovascular:  Negative for chest pain, palpitations and leg swelling.  ?Gastrointestinal:  Negative for abdominal pain.  ?Genitourinary:  Negative for pelvic pain.  ?Musculoskeletal:  Positive for arthralgias, back pain, neck pain and stiffness.  ?Neurological:  Positive for tingling and numbness. Negative for weakness.  ? ? ?  Objective  ?  ?BP 114/73 (BP Location: Right Arm, Patient Position: Sitting, Cuff Size: Normal)   Pulse 80   Temp 97.8 ?F (36.6 ?C) (Oral)   Resp 16   Wt 149 lb 9.6 oz (67.9 kg)   BMI 26.50 kg/m?  ? ? ?Physical Exam ?Vitals reviewed.  ?Constitutional:   ?   General: She is awake.  ?   Appearance: Normal appearance. She is well-developed, well-groomed and normal weight.  ?HENT:  ?   Head: Normocephalic and atraumatic.  ?Musculoskeletal:  ?   Right shoulder: No swelling or crepitus. Normal range of  motion. Normal pulse.  ?   Left shoulder: Tenderness present. No swelling, effusion, bony tenderness or crepitus. Normal range of motion. Normal strength. Normal pulse.  ?     Back: ? ?   Comments: Able to perform all major shoulder ROM - able to perform the following without reduced ROM: internal and external rotation, abduction, adduction,  ? pain with forward flexion to extension  ?Negative [place] scratch test  ?Negative drop arm   ?Neurological:  ?   Mental Status: She is alert.  ?Psychiatric:     ?   Attention and Perception: Attention normal.     ?   Mood and Affect: Mood and affect normal.     ?   Speech: Speech normal.     ?   Behavior: Behavior normal. Behavior is cooperative.  ?  ? ? ?No results found for any visits on 08/10/21. ? Assessment & Plan  ?  ? ?  Problem List Items Addressed This Visit   ?None ?Visit Diagnoses   ? ? Acute pain of left shoulder    -  Primary ?Acute, recurrent  ?States she was seen for similar concern a few months ago and found relief with muscle relaxer and prescription NSAID ?She states the pain is not relieved with the same measures as before and is concerned for further injury ?Denies recent trauma or injury to the area.  ?Recommend conservative measures at this time: will provide refill of Cyclobenzaprine 5 mg PO TID PRN - prefer this to be used at night for relief then ?Will provide Meloxicam 7.5 mg PO QD - she may use Tylenol as needed for further discomfort/ pain ?Reviewed most recent CMP : GFR >60  ?Will place refer to PT for this as it appears to be recurrent ?Provided stretches and info for basic home measures in AVS ?If pain continues recommend neck DG to assess for stenosis or compression along with shoulder imaging to assess for potential etiology as indicated ?May need to refer to Ortho for further eval and intervention if not responding to these measures.  ?Reviewed return precautions ?Follow up as needed for persistent or worsening symptoms.  ?  ? Relevant  Medications  ? cyclobenzaprine (FLEXERIL) 5 MG tablet  ? meloxicam (MOBIC) 7.5 MG tablet  ? Other Relevant Orders  ? Ambulatory referral to Physical Therapy  ? ?  ? ? ? ?No follow-ups on file. ? ? ?I, Manisha Cancel E Christropher Gintz, PA-C,

## 2021-08-14 ENCOUNTER — Ambulatory Visit: Payer: Self-pay | Admitting: *Deleted

## 2021-08-14 ENCOUNTER — Ambulatory Visit (INDEPENDENT_AMBULATORY_CARE_PROVIDER_SITE_OTHER): Payer: BC Managed Care – PPO | Admitting: Family Medicine

## 2021-08-14 ENCOUNTER — Encounter: Payer: Self-pay | Admitting: Family Medicine

## 2021-08-14 VITALS — BP 113/62 | HR 73 | Temp 98.1°F | Resp 18 | Wt 151.0 lb

## 2021-08-14 DIAGNOSIS — S46812S Strain of other muscles, fascia and tendons at shoulder and upper arm level, left arm, sequela: Secondary | ICD-10-CM

## 2021-08-14 DIAGNOSIS — S46812A Strain of other muscles, fascia and tendons at shoulder and upper arm level, left arm, initial encounter: Secondary | ICD-10-CM

## 2021-08-14 MED ORDER — HYDROCODONE-ACETAMINOPHEN 5-325 MG PO TABS: 1.0000 | ORAL_TABLET | Freq: Four times a day (QID) | ORAL | 0 refills | Status: DC | PRN

## 2021-08-14 MED ORDER — METHOCARBAMOL 750 MG PO TABS: 750.0000 mg | ORAL_TABLET | Freq: Four times a day (QID) | ORAL | 0 refills | Status: DC

## 2021-08-14 MED ORDER — NABUMETONE 750 MG PO TABS: 750.0000 mg | ORAL_TABLET | Freq: Two times a day (BID) | ORAL | 0 refills | Status: DC

## 2021-08-14 NOTE — Telephone Encounter (Signed)
Summary: still having pain, after medication  ? Pt called in stating she was recently seen and was giving some medication, but states that it is still not helping with the pain in her shoulder, and requested a call back, please advise.  ?  ? ?Reason for Disposition ? [1] SEVERE pain AND [2] not improved 2 hours after pain medicine ? ?Answer Assessment - Initial Assessment Questions ?1. ONSET: "When did the pain start?" ?    Few months ago- came back- Saturday 1 week ago ?2. LOCATION: "Where is the pain located?" ?    Left  ?3. PAIN: "How bad is the pain?" (Scale 1-10; or mild, moderate, severe) ?  - MILD (1-3): doesn't interfere with normal activities ?  - MODERATE (4-7): interferes with normal activities (e.g., work or school) or awakens from sleep ?  - SEVERE (8-10): excruciating pain, unable to do any normal activities, unable to move arm at all due to pain ?    severe ?4. WORK OR EXERCISE: "Has there been any recent work or exercise that involved this part of the body?" ?    No- first occurrence- mopping- this time not sure ?5. CAUSE: "What do you think is causing the shoulder pain?" ?    unsure ?6. OTHER SYMPTOMS: "Do you have any other symptoms?" (e.g., neck pain, swelling, rash, fever, numbness, weakness) ?    Not sure if swelling- husband reports knot ?7. PREGNANCY: "Is there any chance you are pregnant?" "When was your last menstrual period?" ? ?Protocols used: Shoulder Pain-A-AH ? ?

## 2021-08-14 NOTE — Telephone Encounter (Signed)
?  Chief Complaint: shoulder pain- not better ?Symptoms: pain- severe ?Frequency: 08/10/21 ?Pertinent Negatives: Patient denies neck pain, swelling, rash, fever, numbness, weakness ?Disposition: '[]'$ ED /'[]'$ Urgent Care (no appt availability in office) / '[x]'$ Appointment(In office/virtual)/ '[]'$  [facility]/ '[]'$ Home Care/ '[]'$ Refused Recommended Disposition /'[]'$ [facility]/ '[]'$  Follow-up with PCP ?Additional Notes:    ?

## 2021-08-14 NOTE — Progress Notes (Signed)
?  ? ?I,Roshena L Chambers,acting as a scribe for Lelon Huh, MD.,have documented all relevant documentation on the behalf of Lelon Huh, MD,as directed by  Lelon Huh, MD while in the presence of Lelon Huh, MD.  ? ?Established patient visit ? ? ?Patient: Katelyn Cooper   DOB: November 06, 1955   66 y.o. Female  MRN: [id number] ?Visit Date: 08/14/2021 ? ?Today's healthcare provider: Lelon Huh, MD  ? ?Chief Complaint  ?Patient presents with  ? Shoulder Pain  ? ?Subjective  ?  ?Shoulder Pain  ?Episode onset: 1 month ago. Pertinent negatives include no fever.  Patient was seen by Talitha Givens, PA-C on 08/10/2021 for this problem. During that visit, cyclobenzaprine and meloxicam were prescribed. Referral to physical therapy was also ordered. Today patient comes in reporting good compliance with treatment without any symptom relief. Patient reports that her shoulder pain has worsened since last office visit. She has been applying ice packs to help ease the pain. She rates pain 9/10. ? ?Had similar episode starting in February  initially treated with cyclobenzaprine and naproxen with no relief. Subsequently prescribed prednisone which she took for about 3 days during which she developed GU candidiasis. Pain eventually resolved after stopping prednisone.  ? ? ?Medications: ?Outpatient Medications Prior to Visit  ?Medication Sig  ? acetaminophen (TYLENOL) 500 MG tablet Take 500 mg by mouth every 6 (six) hours as needed.  ? albuterol (VENTOLIN HFA) 108 (90 Base) MCG/ACT inhaler Ventolin HFA 90 mcg/actuation aerosol inhaler ? inhale 2 puffs by mouth every 6 hours if needed for wheezing or shortness of breath  ? aspirin EC 81 MG tablet Take 1 tablet (81 mg total) by mouth daily.  ? atorvastatin (LIPITOR) 80 MG tablet Take 1 tablet (80 mg total) by mouth daily.  ? b complex vitamins capsule Take 1 capsule by mouth daily.  ? Cholecalciferol (VITAMIN D3) 5000 units TABS Take 1 tablet by mouth daily.   ? clobetasol  ointment (TEMOVATE) 0.05 % clobetasol 0.05 % topical ointment ? PLEASE SEE ATTACHED FOR DETAILED DIRECTIONS  ? cyclobenzaprine (FLEXERIL) 5 MG tablet Take 1 tablet (5 mg total) by mouth 3 (three) times daily as needed for muscle spasms.  ? furosemide (LASIX) 20 MG tablet TAKE 1 TABLET DAILY PRN FOR INCREASED FLUID OR WEIGHT GAIN  ? glucose blood test strip ONETOUCH ULTRA MINI STRIPS AND LANCETS. Use one daily to check blood sugar.  ? lansoprazole (PREVACID) 15 MG capsule Take 15 mg by mouth daily at 6 (six) AM.  ? meloxicam (MOBIC) 7.5 MG tablet Take 1 tablet (7.5 mg total) by mouth daily.  ? metFORMIN (GLUCOPHAGE-XR) 500 MG 24 hr tablet TAKE 1 TABLET BY MOUTH EVERY EVENING . GENERIC EQUIVALENT FOR GLUCOPHAGE-XR  ? metoprolol tartrate (LOPRESSOR) 25 MG tablet TAKE 1 TABLET TWICE A DAY  ? Multiple Vitamins-Minerals (MULTI-VITAMIN GUMMIES PO) Take by mouth. Taking 1 daily  ? potassium chloride SA (KLOR-CON M20) 20 MEQ tablet Take 2 tablets (40 meq) by mouth once daily only on days you are taking lasix  ? QUEtiapine (SEROQUEL) 100 MG tablet Take 1 tablet (100 mg total) by mouth at bedtime.  ? venlafaxine XR (EFFEXOR-XR) 75 MG 24 hr capsule Take 1 capsule (75 mg total) by mouth daily with breakfast.  ? zaleplon (SONATA) 10 MG capsule Take 1 capsule (10 mg total) by mouth at bedtime as needed for sleep.  ? QUEtiapine (SEROQUEL) 100 MG tablet Take 1 tablet (100 mg total) by mouth at bedtime.  ? ?No facility-administered medications  prior to visit.  ? ? ?Review of Systems  ?Constitutional:  Negative for appetite change, chills, fatigue and fever.  ?Respiratory:  Negative for chest tightness and shortness of breath.   ?Cardiovascular:  Negative for chest pain and palpitations.  ?Gastrointestinal:  Negative for abdominal pain, nausea and vomiting.  ?Musculoskeletal:  Positive for arthralgias (left shoulder).  ?Neurological:  Negative for dizziness and weakness.  ? ? ?  Objective  ?  ?BP 113/62 (BP Location: Right Arm,  Patient Position: Sitting, Cuff Size: Normal)   Pulse 73   Temp 98.1 ?F (36.7 ?C) (Oral)   Resp 18   Wt 151 lb (68.5 kg)   SpO2 97% Comment: room air  BMI 26.75 kg/m?  ? ? ?Physical Exam  ? ?Diffusely tender left of cervical spine into left trapezius and superior spine of scapula. Pain somewhat exacerbated by neck extension and rotating to the left. FROM of shoulder.  ? Assessment & Plan  ?  ? ?1. Trapezius strain, left, sequela ? ?May be eminating from c-spine as entire area left of cervical spine is tender with some numbness into left arm. No improvement with low dose meloxicam and cyclobenzaprine.  ? ?Will change to: ? ?- HYDROcodone-acetaminophen (NORCO/VICODIN) 5-325 MG tablet; Take 1 tablet by mouth every 6 (six) hours as needed for up to 5 days for moderate pain.  Dispense: 20 tablet; Refill: 0 ?- nabumetone (RELAFEN) 750 MG tablet; Take 1 tablet (750 mg total) by mouth 2 (two) times daily.  Dispense: 30 tablet; Refill: 0 ?- methocarbamol (ROBAXIN-750) 750 MG tablet; Take 1 tablet (750 mg total) by mouth 4 (four) times daily.  Dispense: 60 tablet; Refill: 0 ? ?PT referral has already been placed. Recommended she go to [facility] urgent care if symptoms worsen of if not improving with above in 2-3 days.  ? ?   ? ?The entirety of the information documented in the History of Present Illness, Review of Systems and Physical Exam were personally obtained by me. Portions of this information were initially documented by the CMA and reviewed by me for thoroughness and accuracy.   ? ? ?Lelon Huh, MD  ?[facility] ?[phone] (phone) ?[phone] (fax) ? ?[facility]  ?

## 2021-08-16 DIAGNOSIS — M5412 Radiculopathy, cervical region: Secondary | ICD-10-CM | POA: Diagnosis not present

## 2021-08-17 ENCOUNTER — Other Ambulatory Visit: Payer: Self-pay | Admitting: Psychiatry

## 2021-08-21 ENCOUNTER — Ambulatory Visit: Payer: Self-pay

## 2021-08-21 DIAGNOSIS — S46812S Strain of other muscles, fascia and tendons at shoulder and upper arm level, left arm, sequela: Secondary | ICD-10-CM

## 2021-08-21 MED ORDER — HYDROCODONE-ACETAMINOPHEN 5-325 MG PO TABS: 1.0000 | ORAL_TABLET | Freq: Four times a day (QID) | ORAL | 0 refills | Status: DC | PRN

## 2021-08-21 NOTE — Telephone Encounter (Signed)
    Chief Complaint: Left shoulder pain, saw Dr. Caryn Section and [facility]. Goes back to Dr. Sabra Heck at [facility], but is out of Norco and is asking for refill "to get me through until I go back to ortho." Symptoms: Pain 8/10 Frequency: 2 weeks ago Pertinent Negatives: Patient denies  Disposition: '[]'$ ED /'[]'$ Urgent Care (no appt availability in office) / '[]'$ Appointment(In office/virtual)/ '[]'$  [place] [facility]/ '[]'$ Home Care/ '[]'$ Refused Recommended Disposition /'[]'$ [facility]/ '[x]'$  Follow-up with PCP Additional Notes: Please advise pt.  Answer Assessment - Initial Assessment Questions 1. ONSET: "When did the pain start?"     2 weeks ago 2. LOCATION: "Where is the pain located?"     Left shoulder 3. PAIN: "How bad is the pain?" (Scale 1-10; or mild, moderate, severe)   - MILD (1-3): doesn't interfere with normal activities   - MODERATE (4-7): interferes with normal activities (e.g., work or school) or awakens from sleep   - SEVERE (8-10): excruciating pain, unable to do any normal activities, unable to move arm at all due to pain     8 4. WORK OR EXERCISE: "Has there been any recent work or exercise that involved this part of the body?"     No 5. CAUSE: "What do you think is causing the shoulder pain?"     Saw [facility] 6. OTHER SYMPTOMS: "Do you have any other symptoms?" (e.g., neck pain, swelling, rash, fever, numbness, weakness)     Pain 7. PREGNANCY: "Is there any chance you are pregnant?" "When was your last menstrual period?"     No  Protocols used: Shoulder Pain-A-AH

## 2021-08-23 DIAGNOSIS — M5412 Radiculopathy, cervical region: Secondary | ICD-10-CM | POA: Diagnosis not present

## 2021-08-24 NOTE — Progress Notes (Unsigned)
Established patient visit   Patient: Katelyn Cooper   DOB: 1955-06-01   66 y.o. Female  MRN: [id number] Visit Date: 08/25/2021  Today's healthcare provider: Lelon Huh, MD   No chief complaint on file.  Subjective    HPI  Diabetes Mellitus Type II, Follow-up  Lab Results  Component Value Date   HGBA1C 6.6 (A) 04/24/2021   HGBA1C 6.3 (A) 10/17/2020   HGBA1C 6.3 (H) 06/02/2020   Wt Readings from Last 3 Encounters:  08/14/21 151 lb (68.5 kg)  08/10/21 149 lb 9.6 oz (67.9 kg)  06/07/21 148 lb (67.1 kg)   Last seen for diabetes 4 months ago.  Management since then includes continue same treatment. She reports {excellent/good/fair/poor:19665} compliance with treatment. She {is/is not:21021397} having side effects. {document side effects if present:1} Symptoms: {Yes/No:20286} fatigue {Yes/No:20286} foot ulcerations  {Yes/No:20286} appetite changes {Yes/No:20286} nausea  {Yes/No:20286} paresthesia of the feet  {Yes/No:20286} polydipsia  {Yes/No:20286} polyuria {Yes/No:20286} visual disturbances   {Yes/No:20286} vomiting     Home blood sugar records: {diabetes glucometry results:16657}  Episodes of hypoglycemia? {Yes/No:20286} {enter symptoms and frequency of symptoms if yes:1}   Current insulin regiment: none Most Recent Eye Exam: *** {Current exercise:16438:::1} {Current diet habits:16563:::1}  Pertinent Labs: Lab Results  Component Value Date   CHOL 142 05/01/2021   HDL 38 (L) 05/01/2021   LDLCALC 75 05/01/2021   TRIG 172 (H) 05/01/2021   CHOLHDL 3.7 05/01/2021   Lab Results  Component Value Date   NA 142 05/01/2021   K 4.0 05/01/2021   CREATININE 0.95 05/01/2021   EGFR 66 05/01/2021   MICROALBUR 20 04/17/2019   LABMICR 24.5 10/17/2020     ---------------------------------------------------------------------------------------------------   Hypertension, follow-up  BP Readings from Last 3 Encounters:  08/14/21 113/62  08/10/21 114/73   06/07/21 (!) 106/51   Wt Readings from Last 3 Encounters:  08/14/21 151 lb (68.5 kg)  08/10/21 149 lb 9.6 oz (67.9 kg)  06/07/21 148 lb (67.1 kg)     She was last seen for hypertension 4 months ago.  BP at that visit was 118/61. Management since that visit includes continue same medication.  She reports {excellent/good/fair/poor:19665} compliance with treatment. She {is/is not:9024} having side effects. {document side effects if present:1} She is following a {diet:21022986} diet. She {is/is not:9024} exercising. She {does/does not:200015} smoke.  Use of agents associated with hypertension: NSAIDS.   Outside blood pressures are {***enter patient reported home BP readings, or 'not being checked':1}. Symptoms: {Yes/No:20286} chest pain {Yes/No:20286} chest pressure  {Yes/No:20286} palpitations {Yes/No:20286} syncope  {Yes/No:20286} dyspnea {Yes/No:20286} orthopnea  {Yes/No:20286} paroxysmal nocturnal dyspnea {Yes/No:20286} lower extremity edema   Pertinent labs Lab Results  Component Value Date   CHOL 142 05/01/2021   HDL 38 (L) 05/01/2021   LDLCALC 75 05/01/2021   TRIG 172 (H) 05/01/2021   CHOLHDL 3.7 05/01/2021   Lab Results  Component Value Date   NA 142 05/01/2021   K 4.0 05/01/2021   CREATININE 0.95 05/01/2021   EGFR 66 05/01/2021   GLUCOSE 126 (H) 05/01/2021   TSH 1.660 06/02/2020     The 10-year ASCVD risk score (Arnett DK, et al., 2019) is: 22.2%  ---------------------------------------------------------------------------------------------------   Medications: Outpatient Medications Prior to Visit  Medication Sig   acetaminophen (TYLENOL) 500 MG tablet Take 500 mg by mouth every 6 (six) hours as needed.   albuterol (VENTOLIN HFA) 108 (90 Base) MCG/ACT inhaler Ventolin HFA 90 mcg/actuation aerosol inhaler  inhale 2 puffs by mouth every 6 hours if  needed for wheezing or shortness of breath   aspirin EC 81 MG tablet Take 1 tablet (81 mg total) by mouth daily.    atorvastatin (LIPITOR) 80 MG tablet Take 1 tablet (80 mg total) by mouth daily.   b complex vitamins capsule Take 1 capsule by mouth daily.   Cholecalciferol (VITAMIN D3) 5000 units TABS Take 1 tablet by mouth daily.    clobetasol ointment (TEMOVATE) 0.05 % clobetasol 0.05 % topical ointment  PLEASE SEE ATTACHED FOR DETAILED DIRECTIONS   furosemide (LASIX) 20 MG tablet TAKE 1 TABLET DAILY PRN FOR INCREASED FLUID OR WEIGHT GAIN   glucose blood test strip ONETOUCH ULTRA MINI STRIPS AND LANCETS. Use one daily to check blood sugar.   HYDROcodone-acetaminophen (NORCO/VICODIN) 5-325 MG tablet Take 1 tablet by mouth every 6 (six) hours as needed for moderate pain.   lansoprazole (PREVACID) 15 MG capsule Take 15 mg by mouth daily at 6 (six) AM.   metFORMIN (GLUCOPHAGE-XR) 500 MG 24 hr tablet TAKE 1 TABLET BY MOUTH EVERY EVENING . GENERIC EQUIVALENT FOR GLUCOPHAGE-XR   methocarbamol (ROBAXIN-750) 750 MG tablet Take 1 tablet (750 mg total) by mouth 4 (four) times daily.   metoprolol tartrate (LOPRESSOR) 25 MG tablet TAKE 1 TABLET TWICE A DAY   Multiple Vitamins-Minerals (MULTI-VITAMIN GUMMIES PO) Take by mouth. Taking 1 daily   nabumetone (RELAFEN) 750 MG tablet Take 1 tablet (750 mg total) by mouth 2 (two) times daily.   potassium chloride SA (KLOR-CON M20) 20 MEQ tablet Take 2 tablets (40 meq) by mouth once daily only on days you are taking lasix   QUEtiapine (SEROQUEL) 100 MG tablet Take 1 tablet (100 mg total) by mouth at bedtime.   QUEtiapine (SEROQUEL) 100 MG tablet Take 1 tablet (100 mg total) by mouth at bedtime.   venlafaxine XR (EFFEXOR-XR) 75 MG 24 hr capsule Take 1 capsule (75 mg total) by mouth daily with breakfast.   zaleplon (SONATA) 10 MG capsule Take 1 capsule (10 mg total) by mouth at bedtime as needed for sleep.   No facility-administered medications prior to visit.    Review of Systems  {Labs  Heme  Chem  Endocrine  Serology  Results Review (optional):23779}   Objective     There were no vitals taken for this visit. {Show previous vital signs (optional):23777}  Physical Exam  ***  No results found for any visits on 08/25/21.  Assessment & Plan     ***  No follow-ups on file.      {provider attestation***:1}   Lelon Huh, MD  [facility] [phone] (phone) [phone] (fax)  [place]

## 2021-08-25 ENCOUNTER — Ambulatory Visit: Payer: BC Managed Care – PPO | Admitting: Family Medicine

## 2021-08-25 ENCOUNTER — Ambulatory Visit: Payer: BC Managed Care – PPO | Admitting: Internal Medicine

## 2021-08-29 DIAGNOSIS — M5412 Radiculopathy, cervical region: Secondary | ICD-10-CM | POA: Diagnosis not present

## 2021-08-30 ENCOUNTER — Telehealth: Payer: Self-pay | Admitting: Internal Medicine

## 2021-08-30 NOTE — Telephone Encounter (Signed)
*  STAT* If patient is at the pharmacy, call can be transferred to refill team.   1. Which medications need to be refilled? (please list name of each medication and dose if known)  metoprolol tartrate (LOPRESSOR) 25 MG tablet furosemide (LASIX) 20 MG tablet atorvastatin (LIPITOR) 80 MG tablet  2. Which pharmacy/location (including street and city if local pharmacy) is medication to be sent to? [place], [address]  3. Do they need a 30 day or 90 day supply? 90 with refills

## 2021-08-31 MED ORDER — ATORVASTATIN CALCIUM 80 MG PO TABS: 80.0000 mg | ORAL_TABLET | Freq: Every day | ORAL | 0 refills | Status: DC

## 2021-08-31 MED ORDER — METOPROLOL TARTRATE 25 MG PO TABS: 25.0000 mg | ORAL_TABLET | Freq: Two times a day (BID) | ORAL | 0 refills | Status: DC

## 2021-08-31 MED ORDER — FUROSEMIDE 20 MG PO TABS: ORAL_TABLET | ORAL | 0 refills | Status: DC

## 2021-08-31 NOTE — Telephone Encounter (Signed)
Requested Prescriptions   Signed Prescriptions Disp Refills   atorvastatin (LIPITOR) 80 MG tablet 90 tablet 0    Sig: Take 1 tablet (80 mg total) by mouth daily.    Authorizing Provider: END, CHRISTOPHER    Ordering User: Sheril Hammond C   furosemide (LASIX) 20 MG tablet 90 tablet 0    Sig: TAKE 1 TABLET DAILY PRN FOR INCREASED FLUID OR WEIGHT GAIN    Authorizing Provider: END, CHRISTOPHER    Ordering User: Felder Lebeda C   metoprolol tartrate (LOPRESSOR) 25 MG tablet 180 tablet 0    Sig: Take 1 tablet (25 mg total) by mouth 2 (two) times daily.    Authorizing Provider: END, CHRISTOPHER    Ordering User: Britt Bottom

## 2021-09-01 ENCOUNTER — Ambulatory Visit (INDEPENDENT_AMBULATORY_CARE_PROVIDER_SITE_OTHER): Payer: BC Managed Care – PPO | Admitting: Internal Medicine

## 2021-09-01 ENCOUNTER — Other Ambulatory Visit: Payer: Self-pay

## 2021-09-01 ENCOUNTER — Encounter: Payer: Self-pay | Admitting: Internal Medicine

## 2021-09-01 VITALS — BP 118/60 | HR 89 | Resp 18 | Ht 63.0 in | Wt 148.6 lb

## 2021-09-01 DIAGNOSIS — Z720 Tobacco use: Secondary | ICD-10-CM

## 2021-09-01 DIAGNOSIS — E1169 Type 2 diabetes mellitus with other specified complication: Secondary | ICD-10-CM

## 2021-09-01 DIAGNOSIS — E785 Hyperlipidemia, unspecified: Secondary | ICD-10-CM

## 2021-09-01 DIAGNOSIS — I2510 Atherosclerotic heart disease of native coronary artery without angina pectoris: Secondary | ICD-10-CM | POA: Diagnosis not present

## 2021-09-01 DIAGNOSIS — I739 Peripheral vascular disease, unspecified: Secondary | ICD-10-CM | POA: Diagnosis not present

## 2021-09-01 DIAGNOSIS — I5032 Chronic diastolic (congestive) heart failure: Secondary | ICD-10-CM | POA: Diagnosis not present

## 2021-09-01 NOTE — Patient Instructions (Signed)
Medication Instructions:  ? ?Your physician recommends that you continue on your current medications as directed. Please refer to the Current Medication list given to you today. ? ?*If you need a refill on your cardiac medications before your next appointment, please call your pharmacy* ? ? ?Lab Work: ? ?None ordered ? ?Testing/Procedures: ? ?None ordered ? ? ?Follow-Up: ?At [facility], you and your health needs are our priority.  As part of our continuing mission to provide you with exceptional heart care, we have created designated Provider Care Teams.  These Care Teams include your primary Cardiologist (physician) and Advanced Practice Providers (APPs -  Physician Assistants and Nurse Practitioners) who all work together to provide you with the care you need, when you need it. ? ?We recommend signing up for the patient portal called "MyChart".  Sign up information is provided on this After Visit Summary.  MyChart is used to connect with patients for Virtual Visits (Telemedicine).  Patients are able to view lab/test results, encounter notes, upcoming appointments, etc.  Non-urgent messages can be sent to your provider as well.   ?To learn more about what you can do with MyChart, go to [email or web address].   ? ?Your next appointment:   ?1 year(s) ? ?The format for your next appointment:   ?In Person ? ?Provider:   ?You may see Christopher End, MD or one of the following Advanced Practice Providers on your designated Care Team:   ?Christopher Berge, NP ?Ryan Dunn, PA-C ?Cadence Furth, PA-C ? ?Important Information About Sugar ? ? ? ? ? ? ?

## 2021-09-01 NOTE — Progress Notes (Signed)
Virtual Visit via Telephone Note  I connected with Katelyn Cooper on 09/05/21 at  1:20 PM EDT by telephone and verified that I am speaking with the correct person using two identifiers.  Location: Patient: home Provider: office Persons participated in the visit- patient, provider    I discussed the limitations, risks, security and privacy concerns of performing an evaluation and management service by telephone and the availability of in person appointments. I also discussed with the patient that there may be a patient responsible charge related to this service. The patient expressed understanding and agreed to proceed.    I discussed the assessment and treatment plan with the patient. The patient was provided an opportunity to ask questions and all were answered. The patient agreed with the plan and demonstrated an understanding of the instructions.   The patient was advised to call back or seek an in-person evaluation if the symptoms worsen or if the condition fails to improve as anticipated.  I provided 12 minutes of non-face-to-face time during this encounter.   [name], MD    [facility] MD/PA/NP OP Progress Note  09/05/2021 2:12 PM Katelyn Cooper  MRN:  [id number]  Chief Complaint:  Chief Complaint  Patient presents with   Follow-up   Depression   HPI:  This is a follow-up appointment for depression.  She states that she was doing good until a few weeks ago.  She started to have worsening in back pain.  She had MRI, and will have a visit tomorrow with a provider.  She feels "yuck" since taking gabapentin, tramadol, although her pain has been more bearable.  She feels down as she is unable to drive, or sewing.  She sleeps well.  She denies change in appetite.  She does not have much anxiety now that she is not around with people.  She denies SI.  She thinks her mood is good with the current medication regimen.   Exercise: walk 3 miles, 3 days a week with her friend Employment:  retired/Used to work for [employer] in 2018, work 3 days a week at [employer].  Support: husband Household: husband Marital status: married in 1996. Married twice Number of children: 0. 1 step son from previous marriage (she raised him. 67 yo in 2022) Last PCP / ongoing medical evaluation:  due next month for annual visit  Visit Diagnosis:    ICD-10-CM   1. MDD (major depressive disorder), recurrent, in partial remission (HCC)  F33.41 venlafaxine XR (EFFEXOR-XR) 75 MG 24 hr capsule    2. Insomnia, unspecified type  G47.00 zaleplon (SONATA) 10 MG capsule      Past Psychiatric History: Please see initial evaluation for full details. I have reviewed the history. No updates at this time.     Past Medical History:  Past Medical History:  Diagnosis Date   (HFpEF) heart failure with preserved ejection fraction ([place])    a. 12/2015 Echo: EF 55-60%, nl RV size/fxn, no significant valvular abnormalities; b. 10/2017 Echo: EF 50-55%, antsept, ant HK. Gr2 DD. Mild MR. Nl RV fxn. PASP 47mHg.   Anxiety    Basal cell carcinoma 08/08/2017   L nose above mid nasal alar groove   CHF (congestive heart failure) (HCC)    Depression    Diabetes type 2, controlled ([place]    diet-controlled   Esophageal stricture    GERD (gastroesophageal reflux disease)    Hiatal hernia    History of chicken pox    Hyperlipidemia  Hypertension    Internal hemorrhoids    Non-obstructive CAD (coronary artery disease)    a. 12/2015 MV: apical ant and apical reversible defect. EF 55-65%; b. 01/2016 Cath: LM nl, LAD nl, SP1 40ost, LCX nl, OM1 30, RCA 30p/m, EDP 15-61mHg; c. 10/2017 Cath: LM nl, LAD nl, SP1 30ost, LCX nl, OM1 30, RCA 30p/m. EF 55%.   Obstructive sleep apnea    PAD (peripheral artery disease) ([place]    a. 1990's s/p prior LCIA stenting; b. 12/2015 ABI: R 1.05, L 1.03.   Panic disorder     Past Surgical History:  Procedure Laterality Date   AORTA SURGERY  1999   stent placement; ? left iliac artery  intervention   APPENDECTOMY  1998   BASAL CELL CARCINOMA EXCISION     CARDIAC CATHETERIZATION N/A 01/12/2016   Procedure: Left Heart Cath and Coronary Angiography;  Surgeon: CNelva Bush MD;  [facility];  Service: Cardiovascular;  Laterality: N/A;   COLONOSCOPY  07/17/2019   FOOT SURGERY     Right   pap smear  03/2010   Done by Dr. AEverett Graff normal   REFRACTIVE SURGERY     right   RIB RESECTION  [id number]  RIGHT/LEFT HEART CATH AND CORONARY ANGIOGRAPHY N/A 11/08/2017   Procedure: RIGHT/LEFT HEART CATH AND CORONARY ANGIOGRAPHY;  Surgeon: AWellington Hampshire MD;  [facility];  Service: Cardiovascular;  Laterality: N/A;   SHOULDER SURGERY  2005   left   UPPER GASTROINTESTINAL ENDOSCOPY  07/17/2019   UPPER GI ENDOSCOPY  09/19/2011   Stricture at GE junction, dilated. Mild gastritis and duodenitis. Small hiatal hernia    Family Psychiatric History: Please see initial evaluation for full details. I have reviewed the history. No updates at this time.     Family History:  Family History  Problem Relation Age of Onset   Lung cancer Mother        was a smoker   Emphysema Mother    Cancer Mother        Lung   Alcohol abuse Mother    Depression Mother    Heart disease Mother    Drug abuse Sister    Breast cancer Sister        x 2  (30&50)   Emphysema Sister    Bipolar disorder Sister    Alcohol abuse Other    Depression Other    Bipolar disorder Other    Heart disease Other    Vision loss Other    Alcohol abuse Father    Mood Disorder Father    Heart disease Maternal Grandmother    Stroke Maternal Grandmother    Esophageal cancer Maternal Aunt    Colon cancer Neg Hx    Colon polyps Neg Hx    Rectal cancer Neg Hx    Stomach cancer Neg Hx     Social History:  Social History   Socioeconomic History   Marital status: Married    Spouse name: mark   Number of children: 0   Years of education: Not on file   Highest education  level: High school graduate  Occupational History   Occupation: computer-retired    Employer: [employer]  Tobacco Use   Smoking status: Every Day    Packs/day: 1.00    Years: 40.00    Pack years: 40.00    Types: Cigarettes    Start date: 03/01/1975    Last attempt to quit: 08/11/2017    Years  since quitting: 4.0   Smokeless tobacco: Never   Tobacco comments:    0.75PPD 10/11/2020  Vaping Use   Vaping Use: Former  Substance and Sexual Activity   Alcohol use: No    Alcohol/week: 0.0 standard drinks   Drug use: No   Sexual activity: Not Currently  Other Topics Concern   Not on file  Social History Narrative   Not on file   Social Determinants of Health   Financial Resource Strain: Not on file  Food Insecurity: Not on file  Transportation Needs: Not on file  Physical Activity: Not on file  Stress: Not on file  Social Connections: Not on file    Allergies:  Allergies  Allergen Reactions   Lovastatin     depression   Paroxetine Hcl     itching   Lamotrigine Rash    Metabolic Disorder Labs: Lab Results  Component Value Date   HGBA1C 6.6 (A) 04/24/2021   No results found for: PROLACTIN Lab Results  Component Value Date   CHOL 142 05/01/2021   TRIG 172 (H) 05/01/2021   HDL 38 (L) 05/01/2021   CHOLHDL 3.7 05/01/2021   VLDL 41 (H) 03/14/2020   LDLCALC 75 05/01/2021   LDLCALC 53 03/14/2020   Lab Results  Component Value Date   TSH 1.660 06/02/2020   TSH 1.550 04/17/2019    Therapeutic Level Labs: No results found for: LITHIUM No results found for: VALPROATE No components found for:  CBMZ  Current Medications: Current Outpatient Medications  Medication Sig Dispense Refill   gabapentin (NEURONTIN) 300 MG capsule Take 300 mg by mouth 3 (three) times daily.     acetaminophen (TYLENOL) 500 MG tablet Take 500 mg by mouth every 6 (six) hours as needed.     albuterol (VENTOLIN HFA) 108 (90 Base) MCG/ACT inhaler Ventolin HFA 90 mcg/actuation aerosol inhaler   inhale 2 puffs by mouth every 6 hours if needed for wheezing or shortness of breath     aspirin EC 81 MG tablet Take 1 tablet (81 mg total) by mouth daily.     atorvastatin (LIPITOR) 80 MG tablet Take 1 tablet (80 mg total) by mouth daily. 90 tablet 0   b complex vitamins capsule Take 1 capsule by mouth daily.     Cholecalciferol (VITAMIN D3) 5000 units TABS Take 1 tablet by mouth daily.   0   clobetasol ointment (TEMOVATE) 0.05 % clobetasol 0.05 % topical ointment  PLEASE SEE ATTACHED FOR DETAILED DIRECTIONS     furosemide (LASIX) 20 MG tablet TAKE 1 TABLET DAILY PRN FOR INCREASED FLUID OR WEIGHT GAIN 90 tablet 0   glucose blood test strip ONETOUCH ULTRA MINI STRIPS AND LANCETS. Use one daily to check blood sugar. 100 each 3   HYDROcodone-acetaminophen (NORCO/VICODIN) 5-325 MG tablet Take 1 tablet by mouth every 6 (six) hours as needed for moderate pain. 30 tablet 0   lansoprazole (PREVACID) 15 MG capsule Take 15 mg by mouth daily at 6 (six) AM.     metFORMIN (GLUCOPHAGE-XR) 500 MG 24 hr tablet TAKE 1 TABLET BY MOUTH EVERY EVENING . GENERIC EQUIVALENT FOR GLUCOPHAGE-XR 90 tablet 4   methocarbamol (ROBAXIN-750) 750 MG tablet Take 1 tablet (750 mg total) by mouth 4 (four) times daily. 60 tablet 0   metoprolol tartrate (LOPRESSOR) 25 MG tablet Take 1 tablet (25 mg total) by mouth 2 (two) times daily. 180 tablet 0   Multiple Vitamins-Minerals (MULTI-VITAMIN GUMMIES PO) Take by mouth. Taking 1 daily     nabumetone (  RELAFEN) 750 MG tablet Take 1 tablet (750 mg total) by mouth 2 (two) times daily. 30 tablet 0   potassium chloride SA (KLOR-CON M20) 20 MEQ tablet Take 2 tablets (40 meq) by mouth once daily only on days you are taking lasix 90 tablet 0   [START ON 09/08/2021] QUEtiapine (SEROQUEL) 100 MG tablet Take 1 tablet (100 mg total) by mouth at bedtime. 90 tablet 0   [START ON 10/04/2021] venlafaxine XR (EFFEXOR-XR) 75 MG 24 hr capsule Take 1 capsule (75 mg total) by mouth daily with breakfast. 90  capsule 0   [START ON 10/01/2021] zaleplon (SONATA) 10 MG capsule Take 1 capsule (10 mg total) by mouth at bedtime as needed for sleep. 30 capsule 2   No current facility-administered medications for this visit.     Musculoskeletal: Strength & Muscle Tone:  N/A Gait & Station:  N/A Patient leans: N/A  Psychiatric Specialty Exam: Review of Systems  Psychiatric/Behavioral:  Negative for agitation, behavioral problems, confusion, decreased concentration, dysphoric mood, hallucinations, self-injury, sleep disturbance and suicidal ideas. The patient is not nervous/anxious and is not hyperactive.   All other systems reviewed and are negative.  There were no vitals taken for this visit.There is no height or weight on file to calculate BMI.  General Appearance: NA  Eye Contact:  NA  Speech:  Clear and Coherent  Volume:  Normal  Mood:   "yuck"  Affect:  NA  Thought Process:  Coherent  Orientation:  Full (Time, Place, and Person)  Thought Content: Logical   Suicidal Thoughts:  No  Homicidal Thoughts:  No  Memory:  Immediate;   Good  Judgement:  Good  Insight:  Good  Psychomotor Activity:  Normal  Concentration:  Concentration: Good and Attention Span: Good  Recall:  Good  Fund of Knowledge: Good  Language: Good  Akathisia:  No  Handed:  Right  AIMS (if indicated): not done  Assets:  Communication Skills Desire for Improvement  ADL's:  Intact  Cognition: WNL  Sleep:  Good   Screenings: AIMS    Flowsheet Row Office Visit from 03/10/2018 in [place] Total Score 0      PHQ2-9    [place] Office Visit from 08/10/2021 in [place] Visit from 04/24/2021 in [facility] Video Visit from 03/14/2021 in [place] Office Visit from 01/16/2021 in [place] Video Visit from 10/05/2020 in [address]  PHQ-2 Total Score 0 0  0 0 0  PHQ-9 Total Score 0 1 -- -- --      Flowsheet Row Video Visit from 03/14/2021 in [place] Video Visit from 10/05/2020 in [place] No Risk No Risk        Assessment and Plan:  Katelyn Cooper is a 66 y.o. year old female with a history of PTSD, depression, nonobstructive CAD, PAD status post left iliac stent, chronic HFpEF, diet-controlled DM2, thoracic outlet syndrome status post bilateral rib resections (1660), GERD complicated by esophageal stricture, HLD, who presents for follow up appointment for below.     1. MDD (major depressive disorder), recurrent, in partial remission ([place]) She denies significant mood symptoms except feeling down due to demoralization because of back/shoulder pain. She reports good support from her husband, and maintains good connection with her friend.  Will continue current dose of venlafaxine and quetiapine to target depression.  Discussed potential risk of serotonin  syndrome with concomitant use of venlafaxine/tramadol. Noted that although she was diagnosed with bipolar 1 disorder in the past, she denies any significant manic symptoms except irritability.  Will continue to monitor.   2. Insomnia, unspecified type She sleeps well with the current medication.  Will continue current dose of Sonata to target insomnia.  Discussed potential risk of oversedation especially with concomitant use of gabapentin/tramadol.    Plan Continue venlafaxine 75 mg daily Continue quetiapine 100 mg at night (on metformin) Continue Sonata 10 mg at night  Next appointment - 9/6 at 1 PM for 20 mins, video - on Vagifem 10 mg, twice a week, inseritble - on Tramadol, gabapentin 300 mg tid,    Past trials of medication-  Paxil, mirtazapine, Latuda (insomnia), Abilify, Lamotrigine, Gabapentin   The patient demonstrates the following risk factors for suicide: Chronic risk factors for suicide  include: psychiatric disorder of depression . Acute risk factors for suicide include: N/A. Protective factors for this patient include: positive social support, responsibility to others (children, family), coping skills, and hope for the future. Considering these factors, the overall suicide risk at this point appears to be low. Patient is appropriate for outpatient follow up.           Collaboration of Care: Collaboration of Care: Other N/A  Patient/Guardian was advised Release of Information must be obtained prior to any record release in order to collaborate their care with an outside provider. Patient/Guardian was advised if they have not already done so to contact the registration department to sign all necessary forms in order for [name] to release information regarding their care.   Consent: Patient/Guardian gives verbal consent for treatment and assignment of benefits for services provided during this visit. Patient/Guardian expressed understanding and agreed to proceed.    [name], MD 09/05/2021, 2:12 PM

## 2021-09-01 NOTE — Progress Notes (Signed)
Follow-up Outpatient Visit Date: 09/01/2021  Primary Care Provider: [name], MD [address] [place] [address]  Chief Complaint: Follow-up coronary artery disease, HFpEF, and PAD  HPI:  Ms. Hallisey is a 66 y.o. female with history of nonobstructive CAD, PAD status post left iliac stent, chronic HFpEF, DM2, thoracic outlet syndrome status post bilateral rib resections (6045), GERD complicated by esophageal stricture, HLD, and anxiety/depression, who presents for follow-up of chronic HFpEF and PAD.  I last saw her a year ago, at which time she noted occasional lightheadedness and stable exertional dyspnea.  We agreed to change her standing furosemide to prn dosing.  Otherwise, we did not pursue additional medication changes or testing.  Today, Ms. Slovacek reports she is doing well from a heart standpoint.  She has not had any chest pain, shortness of breath, palpitations, or lightheadedness.  She is using her furosemide every other day, though she sometimes needs to take it daily if she has some dietary indiscretion.  She is most bothered by pain in her left neck and shoulder.  She is currently being treated for a pinched nerve by [facility].  Prior to her neck/left shoulder problems, she was walking at least 3 days a week without any difficulty.  That is now on hold.  She has not had any claudication.  She is planning to quit smoking with her husband on Monday.  --------------------------------------------------------------------------------------------------  Cardiovascular History & Procedures: Cardiovascular Problems: Nonobstructive coronary artery disease Peripheral vascular status post left iliac stent HFpEF   Risk Factors: Diabetes mellitus, hyperlipidemia, peripheral vascular disease   Cath/PCI: L/RHC (11/08/2017): LMCA with minimal diffuse disease.  LAD normal.  30% stenosis at first septal perforator.  LCx with mild luminal irregularities.  OM1 with 30%  disease.  RCA with sequential 30% proximal and mid stenoses.  Upper normal filling pressures and normal pulmonary artery pressure with moderately reduced Fick cardiac output (CO 3.1/CI 1.8). LHC (01/12/16): LMCA and LAD normal. There is a 40% stenosis in the proximal aspect of the large first septal perforator. There is 30% OM1 stenosis. 30% proximal and mid RCA lesions are also present. LVEDP mildly elevated at 15-20 mmHg.   CV Surgery: Remote left iliac stent.   EP Procedures and Devices: 14-day event monitor (06/07/2020): Predominantly sinus rhythm with few episodes of PSVT (lasting up to 18.8 seconds) as well as rare PACs and PVCs.   Non-Invasive Evaluation(s): TTE (04/14/2019): Normal LV size and wall thickness with LVEF 60-65%.  There is grade 1 diastolic dysfunction with elevated filling pressures.  Normal RV size and function.  No significant valvular abnormality.  Normal CVP.  Unable to assess PA pressure. Pharmacologic MPI (03/30/2019): Low risk study withotu ischemia or scar.  Coronary artery calcification noted.  LVEF > 65%. TTE (11/06/2017): Normal LV size and wall thickness.  LVEF 50-55% with hypokinesis of the anteroseptal and anterior myocardium.  Grade 2 diastolic dysfunction with elevated filling pressures.  Mild MR.  Normal RV size and function.  Mild pulmonary hypertension (PASP 35 mmHg). Pharmacologic myocardial perfusion stress test (12/23/15): Small, mild area of reversible defect involving the apical anterior and apical segments that could reflect ischemia or breast attenuation. LVEF 55-65%. Borderline TID was noted. Transthoracic echocardiogram (12/28/15): Normal LV systolic and diastolic function (EF 40-98%). No significant valvular abnormalities. Normal RV size and function. Aortoiliac and lower extremity Dopplers: Patent stent in the left common iliac artery. No significant abnormalities. ABI right 1.05, left 1.03.  Recent CV Pertinent Labs: Lab  Results  Component Value Date    CHOL 142 05/01/2021   HDL 38 (L) 05/01/2021   LDLCALC 75 05/01/2021   TRIG 172 (H) 05/01/2021   CHOLHDL 3.7 05/01/2021   CHOLHDL 3.1 03/14/2020   INR 1.06 01/06/2016   BNP 32.1 06/02/2020   BNP 731.0 (H) 11/05/2017   K 4.0 05/01/2021   MG 2.5 (H) 11/08/2017   BUN 9 05/01/2021   CREATININE 0.95 05/01/2021    Past medical and surgical history were reviewed and updated in EPIC.  Current Meds  Medication Sig   acetaminophen (TYLENOL) 500 MG tablet Take 500 mg by mouth every 6 (six) hours as needed.   albuterol (VENTOLIN HFA) 108 (90 Base) MCG/ACT inhaler Ventolin HFA 90 mcg/actuation aerosol inhaler  inhale 2 puffs by mouth every 6 hours if needed for wheezing or shortness of breath   aspirin EC 81 MG tablet Take 1 tablet (81 mg total) by mouth daily.   atorvastatin (LIPITOR) 80 MG tablet Take 1 tablet (80 mg total) by mouth daily.   b complex vitamins capsule Take 1 capsule by mouth daily.   Cholecalciferol (VITAMIN D3) 5000 units TABS Take 1 tablet by mouth daily.    clobetasol ointment (TEMOVATE) 0.05 % clobetasol 0.05 % topical ointment  PLEASE SEE ATTACHED FOR DETAILED DIRECTIONS   furosemide (LASIX) 20 MG tablet TAKE 1 TABLET DAILY PRN FOR INCREASED FLUID OR WEIGHT GAIN   glucose blood test strip ONETOUCH ULTRA MINI STRIPS AND LANCETS. Use one daily to check blood sugar.   HYDROcodone-acetaminophen (NORCO/VICODIN) 5-325 MG tablet Take 1 tablet by mouth every 6 (six) hours as needed for moderate pain.   lansoprazole (PREVACID) 15 MG capsule Take 15 mg by mouth daily at 6 (six) AM.   metFORMIN (GLUCOPHAGE-XR) 500 MG 24 hr tablet TAKE 1 TABLET BY MOUTH EVERY EVENING . GENERIC EQUIVALENT FOR GLUCOPHAGE-XR   methocarbamol (ROBAXIN-750) 750 MG tablet Take 1 tablet (750 mg total) by mouth 4 (four) times daily.   metoprolol tartrate (LOPRESSOR) 25 MG tablet Take 1 tablet (25 mg total) by mouth 2 (two) times daily.   Multiple Vitamins-Minerals (MULTI-VITAMIN GUMMIES PO) Take by mouth.  Taking 1 daily   nabumetone (RELAFEN) 750 MG tablet Take 1 tablet (750 mg total) by mouth 2 (two) times daily.   potassium chloride SA (KLOR-CON M20) 20 MEQ tablet Take 2 tablets (40 meq) by mouth once daily only on days you are taking lasix   QUEtiapine (SEROQUEL) 100 MG tablet Take 1 tablet (100 mg total) by mouth at bedtime.   venlafaxine XR (EFFEXOR-XR) 75 MG 24 hr capsule Take 1 capsule (75 mg total) by mouth daily with breakfast.   zaleplon (SONATA) 10 MG capsule Take 1 capsule (10 mg total) by mouth at bedtime as needed for sleep.    Allergies: Lovastatin, Paroxetine hcl, and Lamotrigine  Social History   Tobacco Use   Smoking status: Every Day    Packs/day: 1.00    Years: 40.00    Pack years: 40.00    Types: Cigarettes    Start date: 03/01/1975    Last attempt to quit: 08/11/2017    Years since quitting: 4.0   Smokeless tobacco: Never   Tobacco comments:    0.75PPD 10/11/2020  Vaping Use   Vaping Use: Former  Substance Use Topics   Alcohol use: No    Alcohol/week: 0.0 standard drinks   Drug use: No    Family History  Problem Relation Age of Onset   Lung cancer Mother  was a smoker   Emphysema Mother    Cancer Mother        Lung   Alcohol abuse Mother    Depression Mother    Heart disease Mother    Drug abuse Sister    Breast cancer Sister        x 2  (30&50)   Emphysema Sister    Bipolar disorder Sister    Alcohol abuse Other    Depression Other    Bipolar disorder Other    Heart disease Other    Vision loss Other    Alcohol abuse Father    Mood Disorder Father    Heart disease Maternal Grandmother    Stroke Maternal Grandmother    Esophageal cancer Maternal Aunt    Colon cancer Neg Hx    Colon polyps Neg Hx    Rectal cancer Neg Hx    Stomach cancer Neg Hx     Review of Systems: A 12-system review of systems was performed and was negative except as noted in the  HPI.  --------------------------------------------------------------------------------------------------  Physical Exam: BP 118/60 (BP Location: Left Arm, Patient Position: Sitting, Cuff Size: Normal)   Pulse 89   Resp 18   Ht '5\' 3"'$  (1.6 m)   Wt 148 lb 9.6 oz (67.4 kg)   SpO2 90%   BMI 26.32 kg/m   General:  NAD. Neck: Soft collar in place. Lungs: Clear to auscultation bilaterally without wheezes or crackles. Heart: Regular rate and rhythm without murmurs, rubs, or gallops. Abdomen: Soft, nontender, nondistended. Extremities: No lower extremity edema.  2+ posterior tibial and dorsalis pedis pulses bilaterally.  EKG: Normal sinus rhythm with left bundle branch block.  No significant change from prior tracing on 06/07/2020.  Lab Results  Component Value Date   WBC 9.0 05/01/2021   HGB 13.9 05/01/2021   HCT 40.7 05/01/2021   MCV 90 05/01/2021   PLT 185 05/01/2021    Lab Results  Component Value Date   NA 142 05/01/2021   K 4.0 05/01/2021   CL 106 05/01/2021   CO2 20 05/01/2021   BUN 9 05/01/2021   CREATININE 0.95 05/01/2021   GLUCOSE 126 (H) 05/01/2021   ALT 27 05/01/2021    Lab Results  Component Value Date   CHOL 142 05/01/2021   HDL 38 (L) 05/01/2021   LDLCALC 75 05/01/2021   TRIG 172 (H) 05/01/2021   CHOLHDL 3.7 05/01/2021    --------------------------------------------------------------------------------------------------  ASSESSMENT AND PLAN: Nonobstructive coronary artery disease: No angina reported.  EKG is stable with chronic LBBB.  Continue secondary prevention with aspirin and atorvastatin as well as metoprolol.  Chronic HFpEF: Ms. Cedar appears euvolemic with NYHA class I symptoms, though her functional capacity has been limited recently due to her neck problems.  I think it is reasonable to continue with as needed furosemide and potassium chloride.  PAD: No claudication reported.  Pedal pulses remain normal.  Continue aspirin and atorvastatin for  secondary prevention.  Hyperlipidemia associated with type 2 diabetes mellitus: Most recent lipid panel in January notable for LDL and triglycerides just above goal at 75 and 172, respectively.  We have agreed to continue with atorvastatin 80 mg daily and to work on lifestyle modifications.  Tobacco abuse: I congratulated Ms. Estis on setting a quit date and encouraged her to move forward with her plan.  Follow-up: Return to clinic in 1 year.  Nelva Bush, MD 09/01/2021 8:16 AM

## 2021-09-05 ENCOUNTER — Encounter: Payer: Self-pay | Admitting: Psychiatry

## 2021-09-05 ENCOUNTER — Telehealth (INDEPENDENT_AMBULATORY_CARE_PROVIDER_SITE_OTHER): Payer: BC Managed Care – PPO | Admitting: Psychiatry

## 2021-09-05 DIAGNOSIS — F3341 Major depressive disorder, recurrent, in partial remission: Secondary | ICD-10-CM

## 2021-09-05 DIAGNOSIS — G4700 Insomnia, unspecified: Secondary | ICD-10-CM | POA: Diagnosis not present

## 2021-09-05 MED ORDER — QUETIAPINE FUMARATE 100 MG PO TABS: 100.0000 mg | ORAL_TABLET | Freq: Every day | ORAL | 0 refills | Status: DC

## 2021-09-05 MED ORDER — VENLAFAXINE HCL ER 75 MG PO CP24: 75.0000 mg | ORAL_CAPSULE | Freq: Every day | ORAL | 0 refills | Status: DC

## 2021-09-05 MED ORDER — ZALEPLON 10 MG PO CAPS: 10.0000 mg | ORAL_CAPSULE | Freq: Every evening | ORAL | 2 refills | Status: DC | PRN

## 2021-09-06 DIAGNOSIS — M5412 Radiculopathy, cervical region: Secondary | ICD-10-CM | POA: Diagnosis not present

## 2021-09-11 ENCOUNTER — Ambulatory Visit: Payer: BC Managed Care – PPO | Admitting: Family Medicine

## 2021-09-15 DIAGNOSIS — M5412 Radiculopathy, cervical region: Secondary | ICD-10-CM | POA: Diagnosis not present

## 2021-10-06 DIAGNOSIS — M5412 Radiculopathy, cervical region: Secondary | ICD-10-CM | POA: Diagnosis not present

## 2021-10-10 DIAGNOSIS — K219 Gastro-esophageal reflux disease without esophagitis: Secondary | ICD-10-CM | POA: Diagnosis not present

## 2021-10-10 DIAGNOSIS — I5040 Unspecified combined systolic (congestive) and diastolic (congestive) heart failure: Secondary | ICD-10-CM | POA: Diagnosis not present

## 2021-10-10 DIAGNOSIS — M50022 Cervical disc disorder at C5-C6 level with myelopathy: Secondary | ICD-10-CM | POA: Diagnosis not present

## 2021-10-10 DIAGNOSIS — F17210 Nicotine dependence, cigarettes, uncomplicated: Secondary | ICD-10-CM | POA: Diagnosis not present

## 2021-10-10 DIAGNOSIS — F319 Bipolar disorder, unspecified: Secondary | ICD-10-CM | POA: Diagnosis not present

## 2021-10-10 DIAGNOSIS — M50123 Cervical disc disorder at C6-C7 level with radiculopathy: Secondary | ICD-10-CM | POA: Diagnosis not present

## 2021-10-10 DIAGNOSIS — E7800 Pure hypercholesterolemia, unspecified: Secondary | ICD-10-CM | POA: Diagnosis not present

## 2021-10-10 DIAGNOSIS — M4802 Spinal stenosis, cervical region: Secondary | ICD-10-CM | POA: Diagnosis not present

## 2021-10-10 DIAGNOSIS — M50122 Cervical disc disorder at C5-C6 level with radiculopathy: Secondary | ICD-10-CM | POA: Diagnosis not present

## 2021-10-10 DIAGNOSIS — E1151 Type 2 diabetes mellitus with diabetic peripheral angiopathy without gangrene: Secondary | ICD-10-CM | POA: Diagnosis not present

## 2021-10-10 DIAGNOSIS — M5412 Radiculopathy, cervical region: Secondary | ICD-10-CM | POA: Diagnosis not present

## 2021-10-10 DIAGNOSIS — F4310 Post-traumatic stress disorder, unspecified: Secondary | ICD-10-CM | POA: Diagnosis not present

## 2021-10-10 DIAGNOSIS — I739 Peripheral vascular disease, unspecified: Secondary | ICD-10-CM | POA: Diagnosis not present

## 2021-10-10 DIAGNOSIS — M50023 Cervical disc disorder at C6-C7 level with myelopathy: Secondary | ICD-10-CM | POA: Diagnosis not present

## 2021-10-10 DIAGNOSIS — E785 Hyperlipidemia, unspecified: Secondary | ICD-10-CM | POA: Diagnosis not present

## 2021-10-10 DIAGNOSIS — G4733 Obstructive sleep apnea (adult) (pediatric): Secondary | ICD-10-CM | POA: Diagnosis not present

## 2021-10-10 DIAGNOSIS — G8929 Other chronic pain: Secondary | ICD-10-CM | POA: Diagnosis not present

## 2021-10-10 DIAGNOSIS — I2510 Atherosclerotic heart disease of native coronary artery without angina pectoris: Secondary | ICD-10-CM | POA: Diagnosis not present

## 2021-10-10 DIAGNOSIS — F419 Anxiety disorder, unspecified: Secondary | ICD-10-CM | POA: Diagnosis not present

## 2021-10-10 HISTORY — PX: OTHER SURGICAL HISTORY: SHX169

## 2021-10-17 DIAGNOSIS — Z743 Need for continuous supervision: Secondary | ICD-10-CM | POA: Diagnosis not present

## 2021-10-17 DIAGNOSIS — F16929 Hallucinogen use, unspecified with intoxication, unspecified: Secondary | ICD-10-CM | POA: Diagnosis not present

## 2021-10-18 ENCOUNTER — Other Ambulatory Visit: Payer: Self-pay

## 2021-10-18 ENCOUNTER — Emergency Department: Payer: BC Managed Care – PPO

## 2021-10-18 ENCOUNTER — Ambulatory Visit: Payer: BC Managed Care – PPO | Admitting: Primary Care

## 2021-10-18 ENCOUNTER — Observation Stay
Admission: EM | Admit: 2021-10-18 | Discharge: 2021-10-19 | Disposition: A | Discharge status: Home Health | Payer: BC Managed Care – PPO | Attending: Internal Medicine | Admitting: Internal Medicine

## 2021-10-18 DIAGNOSIS — I5032 Chronic diastolic (congestive) heart failure: Secondary | ICD-10-CM | POA: Insufficient documentation

## 2021-10-18 DIAGNOSIS — E119 Type 2 diabetes mellitus without complications: Secondary | ICD-10-CM | POA: Diagnosis not present

## 2021-10-18 DIAGNOSIS — Z7982 Long term (current) use of aspirin: Secondary | ICD-10-CM | POA: Insufficient documentation

## 2021-10-18 DIAGNOSIS — R531 Weakness: Secondary | ICD-10-CM

## 2021-10-18 DIAGNOSIS — Z7984 Long term (current) use of oral hypoglycemic drugs: Secondary | ICD-10-CM | POA: Diagnosis not present

## 2021-10-18 DIAGNOSIS — Z85828 Personal history of other malignant neoplasm of skin: Secondary | ICD-10-CM | POA: Insufficient documentation

## 2021-10-18 DIAGNOSIS — A419 Sepsis, unspecified organism: Principal | ICD-10-CM | POA: Insufficient documentation

## 2021-10-18 DIAGNOSIS — E785 Hyperlipidemia, unspecified: Secondary | ICD-10-CM | POA: Insufficient documentation

## 2021-10-18 DIAGNOSIS — E1110 Type 2 diabetes mellitus with ketoacidosis without coma: Secondary | ICD-10-CM | POA: Diagnosis not present

## 2021-10-18 DIAGNOSIS — D751 Secondary polycythemia: Secondary | ICD-10-CM | POA: Diagnosis not present

## 2021-10-18 DIAGNOSIS — R6510 Systemic inflammatory response syndrome (SIRS) of non-infectious origin without acute organ dysfunction: Secondary | ICD-10-CM | POA: Diagnosis not present

## 2021-10-18 DIAGNOSIS — E876 Hypokalemia: Secondary | ICD-10-CM | POA: Diagnosis not present

## 2021-10-18 DIAGNOSIS — R29898 Other symptoms and signs involving the musculoskeletal system: Secondary | ICD-10-CM | POA: Diagnosis present

## 2021-10-18 DIAGNOSIS — F17210 Nicotine dependence, cigarettes, uncomplicated: Secondary | ICD-10-CM | POA: Diagnosis not present

## 2021-10-18 DIAGNOSIS — Z79899 Other long term (current) drug therapy: Secondary | ICD-10-CM | POA: Diagnosis not present

## 2021-10-18 DIAGNOSIS — R4182 Altered mental status, unspecified: Secondary | ICD-10-CM | POA: Diagnosis not present

## 2021-10-18 DIAGNOSIS — I2510 Atherosclerotic heart disease of native coronary artery without angina pectoris: Secondary | ICD-10-CM | POA: Insufficient documentation

## 2021-10-18 DIAGNOSIS — M4322 Fusion of spine, cervical region: Secondary | ICD-10-CM | POA: Diagnosis not present

## 2021-10-18 DIAGNOSIS — R7401 Elevation of levels of liver transaminase levels: Secondary | ICD-10-CM | POA: Diagnosis present

## 2021-10-18 DIAGNOSIS — I110 Hypertensive heart disease with heart failure: Secondary | ICD-10-CM | POA: Insufficient documentation

## 2021-10-18 DIAGNOSIS — F32A Depression, unspecified: Secondary | ICD-10-CM | POA: Diagnosis not present

## 2021-10-18 DIAGNOSIS — R9082 White matter disease, unspecified: Secondary | ICD-10-CM | POA: Diagnosis not present

## 2021-10-18 DIAGNOSIS — E1169 Type 2 diabetes mellitus with other specified complication: Secondary | ICD-10-CM | POA: Diagnosis not present

## 2021-10-18 DIAGNOSIS — Z20822 Contact with and (suspected) exposure to covid-19: Secondary | ICD-10-CM | POA: Insufficient documentation

## 2021-10-18 DIAGNOSIS — I10 Essential (primary) hypertension: Secondary | ICD-10-CM | POA: Diagnosis present

## 2021-10-18 DIAGNOSIS — M5021 Other cervical disc displacement,  high cervical region: Secondary | ICD-10-CM | POA: Diagnosis not present

## 2021-10-18 DIAGNOSIS — Z720 Tobacco use: Secondary | ICD-10-CM | POA: Diagnosis present

## 2021-10-18 DIAGNOSIS — J439 Emphysema, unspecified: Secondary | ICD-10-CM | POA: Diagnosis not present

## 2021-10-18 DIAGNOSIS — E872 Acidosis, unspecified: Secondary | ICD-10-CM | POA: Diagnosis present

## 2021-10-18 LAB — URINALYSIS, ROUTINE W REFLEX MICROSCOPIC
Bacteria, UA: NONE SEEN
Bilirubin Urine: NEGATIVE
Glucose, UA: NEGATIVE mg/dL
Hgb urine dipstick: NEGATIVE
Ketones, ur: NEGATIVE mg/dL
Leukocytes,Ua: NEGATIVE
Nitrite: NEGATIVE
Protein, ur: 30 mg/dL — AB
Specific Gravity, Urine: 1.023 (ref 1.005–1.030)
pH: 5 (ref 5.0–8.0)

## 2021-10-18 LAB — CBC WITH DIFFERENTIAL/PLATELET
Abs Immature Granulocytes: 0.04 10*3/uL (ref 0.00–0.07)
Basophils Absolute: 0.1 10*3/uL (ref 0.0–0.1)
Basophils Relative: 0 %
Eosinophils Absolute: 0.3 10*3/uL (ref 0.0–0.5)
Eosinophils Relative: 3 %
HCT: 47.5 % — ABNORMAL HIGH (ref 36.0–46.0)
Hemoglobin: 15.6 g/dL — ABNORMAL HIGH (ref 12.0–15.0)
Immature Granulocytes: 0 %
Lymphocytes Relative: 23 %
Lymphs Abs: 2.7 10*3/uL (ref 0.7–4.0)
MCH: 29.6 pg (ref 26.0–34.0)
MCHC: 32.8 g/dL (ref 30.0–36.0)
MCV: 90.1 fL (ref 80.0–100.0)
Monocytes Absolute: 0.7 10*3/uL (ref 0.1–1.0)
Monocytes Relative: 6 %
Neutro Abs: 8.1 10*3/uL — ABNORMAL HIGH (ref 1.7–7.7)
Neutrophils Relative %: 68 %
Platelets: 228 10*3/uL (ref 150–400)
RBC: 5.27 MIL/uL — ABNORMAL HIGH (ref 3.87–5.11)
RDW: 13.8 % (ref 11.5–15.5)
WBC: 12 10*3/uL — ABNORMAL HIGH (ref 4.0–10.5)
nRBC: 0 % (ref 0.0–0.2)

## 2021-10-18 LAB — COMPREHENSIVE METABOLIC PANEL
ALT: 26 U/L (ref 0–44)
AST: 46 U/L — ABNORMAL HIGH (ref 15–41)
Albumin: 4.1 g/dL (ref 3.5–5.0)
Alkaline Phosphatase: 100 U/L (ref 38–126)
Anion gap: 8 (ref 5–15)
BUN: 11 mg/dL (ref 8–23)
CO2: 23 mmol/L (ref 22–32)
Calcium: 9.9 mg/dL (ref 8.9–10.3)
Chloride: 108 mmol/L (ref 98–111)
Creatinine, Ser: 0.71 mg/dL (ref 0.44–1.00)
GFR, Estimated: >60 mL/min (ref >60)
Glucose, Bld: 158 mg/dL — ABNORMAL HIGH (ref 70–99)
Potassium: 3.1 mmol/L — ABNORMAL LOW (ref 3.5–5.1)
Sodium: 139 mmol/L (ref 135–145)
Total Bilirubin: 0.6 mg/dL (ref 0.3–1.2)
Total Protein: 8.3 g/dL — ABNORMAL HIGH (ref 6.5–8.1)

## 2021-10-18 LAB — LACTIC ACID, PLASMA
Lactic Acid, Venous: 3 mmol/L (ref 0.5–1.9)
Lactic Acid, Venous: 1.9 mmol/L (ref 0.5–1.9)

## 2021-10-18 LAB — PROCALCITONIN: Procalcitonin: <0.1 ng/mL

## 2021-10-18 LAB — TROPONIN I (HIGH SENSITIVITY)
Troponin I (High Sensitivity): 6 ng/L (ref <18)
Troponin I (High Sensitivity): 5 ng/L (ref <18)

## 2021-10-18 LAB — TSH: TSH: 0.934 u[IU]/mL (ref 0.350–4.500)

## 2021-10-18 LAB — SARS CORONAVIRUS 2 BY RT PCR: SARS Coronavirus 2 by RT PCR: NEGATIVE

## 2021-10-18 LAB — HIV ANTIBODY (ROUTINE TESTING W REFLEX): HIV Screen 4th Generation wRfx: NONREACTIVE

## 2021-10-18 MED ORDER — SODIUM CHLORIDE 0.9 % IV SOLN: INTRAVENOUS | Status: DC

## 2021-10-18 MED ORDER — GABAPENTIN 300 MG PO CAPS
300.0000 mg | ORAL_CAPSULE | Freq: Three times a day (TID) | ORAL | Status: DC
  Administered 2021-10-18 – 2021-10-19 (×5): 300 mg via ORAL
  Filled 2021-10-18 (×5): qty 1

## 2021-10-18 MED ORDER — SODIUM CHLORIDE 0.9 % IV BOLUS (SEPSIS)
1000.0000 mL | Freq: Once | INTRAVENOUS | Status: AC
  Administered 2021-10-18: 1000 mL via INTRAVENOUS

## 2021-10-18 MED ORDER — OXYCODONE HCL 5 MG PO TABS: 5.0000 mg | ORAL_TABLET | ORAL | Status: DC | PRN

## 2021-10-18 MED ORDER — CEFEPIME HCL 2 G IV SOLR
2.0000 g | Freq: Two times a day (BID) | INTRAVENOUS | Status: DC
Start: 2021-10-18 — End: 2021-10-19
  Administered 2021-10-18 – 2021-10-19 (×3): 2 g via INTRAVENOUS
  Filled 2021-10-18 (×2): qty 12.5
  Filled 2021-10-18: qty 2
  Filled 2021-10-18: qty 12.5

## 2021-10-18 MED ORDER — QUETIAPINE FUMARATE 25 MG PO TABS
100.0000 mg | ORAL_TABLET | Freq: Every day | ORAL | Status: DC
  Administered 2021-10-18: 100 mg via ORAL
  Filled 2021-10-18: qty 4

## 2021-10-18 MED ORDER — SODIUM CHLORIDE 0.9 % IV SOLN
2.0000 g | Freq: Once | INTRAVENOUS | Status: AC
  Administered 2021-10-18: 2 g via INTRAVENOUS
  Filled 2021-10-18: qty 12.5

## 2021-10-18 MED ORDER — SODIUM CHLORIDE 0.9% FLUSH
3.0000 mL | Freq: Two times a day (BID) | INTRAVENOUS | Status: DC
  Administered 2021-10-18 – 2021-10-19 (×2): 3 mL via INTRAVENOUS

## 2021-10-18 MED ORDER — METHOCARBAMOL 500 MG PO TABS
500.0000 mg | ORAL_TABLET | Freq: Two times a day (BID) | ORAL | Status: DC | PRN
Start: 2021-10-18 — End: 2021-10-19

## 2021-10-18 MED ORDER — MORPHINE SULFATE (PF) 4 MG/ML IV SOLN
4.0000 mg | Freq: Once | INTRAVENOUS | Status: AC
  Administered 2021-10-18: 4 mg via INTRAVENOUS
  Filled 2021-10-18: qty 1

## 2021-10-18 MED ORDER — PANTOPRAZOLE SODIUM 20 MG PO TBEC
20.0000 mg | DELAYED_RELEASE_TABLET | Freq: Every day | ORAL | Status: DC
  Administered 2021-10-18 – 2021-10-19 (×2): 20 mg via ORAL
  Filled 2021-10-18 (×2): qty 1

## 2021-10-18 MED ORDER — ONDANSETRON HCL 4 MG/2ML IJ SOLN: 4.0000 mg | Freq: Four times a day (QID) | INTRAMUSCULAR | Status: DC | PRN

## 2021-10-18 MED ORDER — ASPIRIN 81 MG PO TBEC
81.0000 mg | DELAYED_RELEASE_TABLET | Freq: Every day | ORAL | Status: DC
  Administered 2021-10-18 – 2021-10-19 (×2): 81 mg via ORAL
  Filled 2021-10-18 (×2): qty 1

## 2021-10-18 MED ORDER — POTASSIUM CHLORIDE 10 MEQ/100ML IV SOLN: 10.0000 meq | Freq: Once | INTRAVENOUS | Status: DC

## 2021-10-18 MED ORDER — ZOLPIDEM TARTRATE 5 MG PO TABS: 5.0000 mg | ORAL_TABLET | Freq: Every evening | ORAL | Status: DC | PRN

## 2021-10-18 MED ORDER — VANCOMYCIN HCL IN DEXTROSE 1-5 GM/200ML-% IV SOLN
1000.0000 mg | INTRAVENOUS | Status: DC
  Administered 2021-10-18: 1000 mg via INTRAVENOUS
  Filled 2021-10-18 (×2): qty 200

## 2021-10-18 MED ORDER — POTASSIUM CHLORIDE IN NACL 20-0.9 MEQ/L-% IV SOLN: Freq: Once | INTRAVENOUS | Status: AC

## 2021-10-18 MED ORDER — SODIUM CHLORIDE 0.9 % IV BOLUS
1000.0000 mL | Freq: Once | INTRAVENOUS | Status: AC
  Administered 2021-10-18: 1000 mL via INTRAVENOUS

## 2021-10-18 MED ORDER — METRONIDAZOLE 500 MG/100ML IV SOLN
500.0000 mg | Freq: Once | INTRAVENOUS | Status: AC
  Administered 2021-10-18: 500 mg via INTRAVENOUS

## 2021-10-18 MED ORDER — POTASSIUM CHLORIDE CRYS ER 20 MEQ PO TBCR
60.0000 meq | EXTENDED_RELEASE_TABLET | Freq: Once | ORAL | Status: DC
Start: 2021-10-18 — End: 2021-10-18

## 2021-10-18 MED ORDER — ENOXAPARIN SODIUM 40 MG/0.4ML IJ SOSY
40.0000 mg | PREFILLED_SYRINGE | INTRAMUSCULAR | Status: DC
  Administered 2021-10-18: 40 mg via SUBCUTANEOUS
  Filled 2021-10-18: qty 0.4

## 2021-10-18 MED ORDER — POTASSIUM CHLORIDE CRYS ER 20 MEQ PO TBCR
40.0000 meq | EXTENDED_RELEASE_TABLET | Freq: Once | ORAL | Status: AC
  Administered 2021-10-18: 40 meq via ORAL
  Filled 2021-10-18: qty 2

## 2021-10-18 MED ORDER — ONDANSETRON HCL 4 MG PO TABS: 4.0000 mg | ORAL_TABLET | Freq: Four times a day (QID) | ORAL | Status: DC | PRN

## 2021-10-18 MED ORDER — VANCOMYCIN HCL IN DEXTROSE 1-5 GM/200ML-% IV SOLN: 1000.0000 mg | Freq: Once | INTRAVENOUS | Status: DC

## 2021-10-18 MED ORDER — VENLAFAXINE HCL ER 75 MG PO CP24
75.0000 mg | ORAL_CAPSULE | Freq: Every day | ORAL | Status: DC
  Administered 2021-10-18 – 2021-10-19 (×2): 75 mg via ORAL
  Filled 2021-10-18 (×2): qty 1

## 2021-10-18 MED ORDER — ONDANSETRON HCL 4 MG/2ML IJ SOLN
4.0000 mg | Freq: Once | INTRAMUSCULAR | Status: AC
  Administered 2021-10-18: 4 mg via INTRAVENOUS
  Filled 2021-10-18: qty 2

## 2021-10-18 MED ORDER — ACETAMINOPHEN 650 MG RE SUPP: 650.0000 mg | Freq: Four times a day (QID) | RECTAL | Status: DC | PRN

## 2021-10-18 MED ORDER — NICOTINE 14 MG/24HR TD PT24
14.0000 mg | MEDICATED_PATCH | Freq: Every day | TRANSDERMAL | Status: DC
  Filled 2021-10-18: qty 1

## 2021-10-18 MED ORDER — GADOBUTROL 1 MMOL/ML IV SOLN
7.0000 mL | Freq: Once | INTRAVENOUS | Status: AC | PRN
Start: 2021-10-18 — End: 2021-10-18
  Administered 2021-10-18: 7 mL via INTRAVENOUS

## 2021-10-18 MED ORDER — METOPROLOL TARTRATE 25 MG PO TABS
25.0000 mg | ORAL_TABLET | Freq: Two times a day (BID) | ORAL | Status: DC
  Administered 2021-10-18 – 2021-10-19 (×3): 25 mg via ORAL
  Filled 2021-10-18 (×3): qty 1

## 2021-10-18 MED ORDER — HYDRALAZINE HCL 20 MG/ML IJ SOLN: 10.0000 mg | INTRAMUSCULAR | Status: DC | PRN

## 2021-10-18 MED ORDER — ACETAMINOPHEN 325 MG PO TABS
650.0000 mg | ORAL_TABLET | Freq: Four times a day (QID) | ORAL | Status: DC | PRN
  Administered 2021-10-18: 650 mg via ORAL
  Filled 2021-10-18: qty 2

## 2021-10-18 MED ORDER — SACCHAROMYCES BOULARDII 250 MG PO CAPS
250.0000 mg | ORAL_CAPSULE | Freq: Two times a day (BID) | ORAL | Status: DC
  Administered 2021-10-18 – 2021-10-19 (×3): 250 mg via ORAL
  Filled 2021-10-18 (×5): qty 1

## 2021-10-18 MED ORDER — DOCUSATE SODIUM 100 MG PO CAPS
100.0000 mg | ORAL_CAPSULE | Freq: Every day | ORAL | Status: DC | PRN
Start: 2021-10-18 — End: 2021-10-19

## 2021-10-18 MED ORDER — VANCOMYCIN HCL 1250 MG/250ML IV SOLN
1250.0000 mg | Freq: Once | INTRAVENOUS | Status: AC
  Administered 2021-10-18: 1250 mg via INTRAVENOUS
  Filled 2021-10-18: qty 250

## 2021-10-18 MED ORDER — ALBUTEROL SULFATE (2.5 MG/3ML) 0.083% IN NEBU: 2.5000 mg | INHALATION_SOLUTION | Freq: Four times a day (QID) | RESPIRATORY_TRACT | Status: DC | PRN

## 2021-10-18 NOTE — ED Notes (Signed)
PATIENT IN MRI

## 2021-10-18 NOTE — ED Notes (Signed)
Patient remains in MRI 

## 2021-10-18 NOTE — Consult Note (Signed)
CODE SEPSIS - PHARMACY COMMUNICATION  **Broad Spectrum Antibiotics should be administered within 1 hour of Sepsis diagnosis**  Time Code Sepsis Called/Page Received: 0215  Antibiotics Ordered: cefepime, vancomycin, metronidazole  Time of 1st antibiotic administration: 0330  Additional action taken by pharmacy: reached out to RN  If necessary, Name of Provider/Nurse Contacted: Joycelyn Das, RN    Dorothe Pea ,PharmD, BCPS Clinical Pharmacist  10/18/2021  2:18 AM

## 2021-10-18 NOTE — Progress Notes (Signed)
Pharmacy Antibiotic Note  Paisleigh Maroney is a 66 y.o. female w/ PMH of cervical fusion last week for cervical radiculopathy admitted on 10/18/2021 with SIRS.  Pharmacy has been consulted for vancomycin dosing. 1250 mg IV vancomycin administered in the ED at 0444 10/18/21  Plan: start vancomycin 1000 mg IV Q 24 hrs Goal AUC 400-550. Expected AUC: 456 SCr used: 0.80 mg/dL (rounded up) Ke 0.051 h-1, T1/2 13.5h   Height: '5\' 3"'$  (160 cm) Weight: 59.4 kg (131 lb) IBW/kg (Calculated) : 52.4  Temp (24hrs), Avg:98.1 F (36.7 C), Min:97.7 F (36.5 C), Max:98.4 F (36.9 C)  Recent Labs  Lab 10/18/21 0117 10/18/21 0344  WBC 12.0*  --   CREATININE 0.71  --   LATICACIDVEN 3.0* 1.9    Estimated Creatinine Clearance: 56.4 mL/min (by C-G formula based on SCr of 0.71 mg/dL).    Allergies  Allergen Reactions   Lovastatin     depression   Paroxetine Hcl     itching   Lamotrigine Rash    Antimicrobials this admission: 07/19 vancomycin >>  07/19 cefepime >>   Microbiology results: 07/19 BCx: pending 07/19 UCx: pending  07/19 SARS CoV-2: negative  Thank you for allowing pharmacy to be a part of this patient's care.  Dallie Piles 10/18/2021 10:36 AM

## 2021-10-18 NOTE — Progress Notes (Signed)
Admission profile updated. ?

## 2021-10-18 NOTE — ED Provider Notes (Signed)
[facility] Provider Note    Event Date/Time   First MD Initiated Contact with Patient 10/18/21 0022     (approximate)   History   Numbness   HPI  Katelyn Cooper is a 66 y.o. female brought to the ED via EMS from home with a chief complaint of generalized weakness.  Patient had cervical fusion last week by [facility] at the specialty hospital in [place].  This was performed for cervical radiculopathy.  She was ambulating to the bathroom when both legs buckled underneath her and she started to fall but was caught by her husband and assisted to the floor.  Did not strike head or suffer LOC.  States her legs feel like Jell-O.  Denies bowel or bladder incontinence, numbness or tingling.  Denies upper extremity weakness/numbness/tingling.  Denies fever, chills, cough, chest pain, shortness of breath, abdominal pain, nausea, vomiting or dizziness.  States she has not been eating or drinking well after surgery.     Past Medical History   Past Medical History:  Diagnosis Date   (HFpEF) heart failure with preserved ejection fraction ([place])    a. 12/2015 Echo: EF 55-60%, nl RV size/fxn, no significant valvular abnormalities; b. 10/2017 Echo: EF 50-55%, antsept, ant HK. Gr2 DD. Mild MR. Nl RV fxn. PASP 50mHg.   Anxiety    Basal cell carcinoma 08/08/2017   L nose above mid nasal alar groove   CHF (congestive heart failure) (HCC)    Depression    Diabetes type 2, controlled ([place]    diet-controlled   Esophageal stricture    GERD (gastroesophageal reflux disease)    Hiatal hernia    History of chicken pox    Hyperlipidemia    Hypertension    Internal hemorrhoids    Non-obstructive CAD (coronary artery disease)    a. 12/2015 MV: apical ant and apical reversible defect. EF 55-65%; b. 01/2016 Cath: LM nl, LAD nl, SP1 40ost, LCX nl, OM1 30, RCA 30p/m, EDP 15-264mg; c. 10/2017 Cath: LM nl, LAD nl, SP1 30ost, LCX nl, OM1 30, RCA 30p/m. EF 55%.   Obstructive sleep apnea     PAD (peripheral artery disease) (HCOak [place]   a. 1990's s/p prior LCIA stenting; b. 12/2015 ABI: R 1.05, L 1.03.   Panic disorder      Active Problem List   Patient Active Problem List   Diagnosis Date Noted   PSVT (paroxysmal supraventricular tachycardia) (HCDonnellson05/01/2021   Pain 05/31/2020   Vasovagal syncope 05/31/2020   Type 2 diabetes mellitus with diabetic peripheral angiopathy without gangrene, without long-term current use of insulin (HCSan Isidro12/15/2021   Diarrhea 01/27/2020   Angular cheilitis 10/16/2019   Bipolar disorder in remission (HCSun [place]11/12/2018   Post traumatic stress disorder (PTSD) 02/09/2019   Chronic heart failure with preserved ejection fraction (HFpEF) (HCWhatcom08/16/2019   HTN (hypertension) 11/15/2017   Obstructive sleep apnea 11/15/2017   NICM (nonischemic cardiomyopathy) (HCMarshall08/14/2019   CAD in native artery 11/13/2017   Menopausal symptom 06/13/2017   Osteopenia 06/13/2017   Uterine leiomyoma 06/13/2017   PAD (peripheral artery disease) (HCOberon06/20/2018   Hyperlipidemia associated with type 2 diabetes mellitus (HCBranson06/20/2018   Coronary artery disease involving native coronary artery of native heart without angina pectoris 09/05/2016   Abnormal stress test 01/12/2016   Pleuritic chest pain 07/28/2015   Duodenitis 01/05/2015   Dermatitis, nummular 01/05/2015   Allergic rhinitis 12/23/2014   Ataxia 12/23/2014   Back ache 12/23/2014   Cervical muscle strain  12/23/2014   Eczema 12/23/2014   GERD (gastroesophageal reflux disease) 12/23/2014   History of anemia 12/23/2014   Pure hypercholesterolemia 12/23/2014   Nephropyelitis 12/23/2014   Tremor 12/23/2014   Vitamin D deficiency 11/03/2013   Diabetes mellitus type 2, controlled ([place]) 08/13/2011   Cough 05/10/2011   Chest pain 05/10/2011   Tobacco use 05/10/2011     Past Surgical History   Past Surgical History:  Procedure Laterality Date   [place]   stent placement; ? left iliac  artery intervention   [place] N/A 01/12/2016   Procedure: Left Heart Cath and Coronary Angiography;  Surgeon: Nelva Bush, MD;  Location: [place] [facility];  Service: Cardiovascular;  Laterality: N/A;   COLONOSCOPY  07/17/2019   FOOT SURGERY     Right   pap smear  03/2010   Done by [name], normal   REFRACTIVE SURGERY     right   RIB RESECTION  [id number]   RIGHT/LEFT HEART CATH AND CORONARY ANGIOGRAPHY N/A 11/08/2017   Procedure: RIGHT/LEFT HEART CATH AND CORONARY ANGIOGRAPHY;  Surgeon: Wellington Hampshire, MD;  Location: [place] [facility];  Service: Cardiovascular;  Laterality: N/A;   SHOULDER SURGERY  2005   left   UPPER GASTROINTESTINAL ENDOSCOPY  07/17/2019   UPPER GI ENDOSCOPY  09/19/2011   Stricture at GE junction, dilated. Mild gastritis and duodenitis. Small hiatal hernia     Home Medications   Prior to Admission medications   Medication Sig Start Date End Date Taking? Authorizing Provider  aspirin EC 81 MG tablet Take 1 tablet (81 mg total) by mouth daily. 12/12/15  Yes End, Harrell Gave, MD  atorvastatin (LIPITOR) 80 MG tablet Take 1 tablet (80 mg total) by mouth daily. 08/31/21  Yes End, Harrell Gave, MD  b complex vitamins capsule Take 1 capsule by mouth daily.   Yes [provider]  Cholecalciferol (VITAMIN D3) 5000 units TABS Take 1 tablet by mouth daily.  04/09/16  Yes [provider]  docusate sodium (COLACE) 100 MG capsule Take 100 mg by mouth daily as needed. 10/11/21  Yes [provider]  gabapentin (NEURONTIN) 300 MG capsule Take 300 mg by mouth 3 (three) times daily.   Yes [provider]  glucose blood test strip ONETOUCH ULTRA MINI STRIPS AND LANCETS. Use one daily to check blood sugar. 11/18/17  Yes [name], MD  lansoprazole (PREVACID) 15 MG capsule Take 15 mg by mouth daily at 6 (six) AM.   Yes [provider]  metFORMIN  (GLUCOPHAGE-XR) 500 MG 24 hr tablet TAKE 1 TABLET BY MOUTH EVERY EVENING . GENERIC EQUIVALENT FOR GLUCOPHAGE-XR 02/14/21  Yes [name], MD  methocarbamol (ROBAXIN) 500 MG tablet Take 500 mg by mouth 2 (two) times daily. 09/18/21  Yes [provider]  methocarbamol (ROBAXIN-750) 750 MG tablet Take 1 tablet (750 mg total) by mouth 4 (four) times daily. 08/14/21  Yes [name], MD  metoprolol tartrate (LOPRESSOR) 25 MG tablet Take 1 tablet (25 mg total) by mouth 2 (two) times daily. 08/31/21  Yes End, Harrell Gave, MD  Multiple Vitamins-Minerals (MULTI-VITAMIN GUMMIES PO) Take by mouth. Taking 1 daily   Yes [provider]  ondansetron (ZOFRAN-ODT) 4 MG disintegrating tablet 4 mg 2 (two) times daily as needed. 10/11/21  Yes [provider]  oxyCODONE (OXY IR/ROXICODONE) 5 MG immediate release tablet Take 5 mg by mouth every 4 (four)  hours as needed. 10/11/21  Yes [provider]  QUEtiapine (SEROQUEL) 100 MG tablet Take 1 tablet (100 mg total) by mouth at bedtime. 09/08/21 12/07/21 Yes Hisada, Elie Goody, MD  traMADol (ULTRAM) 50 MG tablet Take 50 mg by mouth every 6 (six) hours. 10/06/21  Yes [provider]  venlafaxine XR (EFFEXOR-XR) 75 MG 24 hr capsule Take 1 capsule (75 mg total) by mouth daily with breakfast. 10/04/21 01/02/22 Yes Hisada, Elie Goody, MD  zaleplon (SONATA) 10 MG capsule Take 1 capsule (10 mg total) by mouth at bedtime as needed for sleep. 10/01/21 12/30/21 Yes Hisada, Elie Goody, MD  acetaminophen (TYLENOL) 500 MG tablet Take 500 mg by mouth every 6 (six) hours as needed.    [provider]  albuterol (VENTOLIN HFA) 108 (90 Base) MCG/ACT inhaler Ventolin HFA 90 mcg/actuation aerosol inhaler  inhale 2 puffs by mouth every 6 hours if needed for wheezing or shortness of breath    [provider]  clobetasol ointment (TEMOVATE) 0.05 % clobetasol 0.05 % topical ointment  PLEASE SEE ATTACHED FOR DETAILED DIRECTIONS Patient not taking: Reported  on 10/18/2021    [provider]  furosemide (LASIX) 20 MG tablet TAKE 1 TABLET DAILY PRN FOR INCREASED FLUID OR WEIGHT GAIN 08/31/21   End, Harrell Gave, MD  HYDROcodone-acetaminophen (NORCO/VICODIN) 5-325 MG tablet Take 1 tablet by mouth every 6 (six) hours as needed for moderate pain. Patient not taking: Reported on 10/18/2021 08/21/21   [name], MD  nabumetone (RELAFEN) 750 MG tablet Take 1 tablet (750 mg total) by mouth 2 (two) times daily. Patient not taking: Reported on 10/18/2021 08/14/21   [name], MD  potassium chloride SA (KLOR-CON M20) 20 MEQ tablet Take 2 tablets (40 meq) by mouth once daily only on days you are taking lasix 07/04/21   End, Harrell Gave, MD     Allergies  Lovastatin, Paroxetine hcl, and Lamotrigine   Family History   Family History  Problem Relation Age of Onset   Lung cancer Mother        was a smoker   Emphysema Mother    Cancer Mother        Lung   Alcohol abuse Mother    Depression Mother    Heart disease Mother    Drug abuse Sister    Breast cancer Sister        x 2  (30&50)   Emphysema Sister    Bipolar disorder Sister    Alcohol abuse Other    Depression Other    Bipolar disorder Other    Heart disease Other    Vision loss Other    Alcohol abuse Father    Mood Disorder Father    Heart disease Maternal Grandmother    Stroke Maternal Grandmother    Esophageal cancer Maternal Aunt    Colon cancer Neg Hx    Colon polyps Neg Hx    Rectal cancer Neg Hx    Stomach cancer Neg Hx      Physical Exam  Triage Vital Signs: ED Triage Vitals  Enc Vitals Group     BP 10/18/21 0027 128/73     Pulse Rate 10/18/21 0027 80     Resp 10/18/21 0027 18     Temp --      Temp src --      SpO2 10/18/21 0027 96 %     Weight 10/18/21 0029 131 lb (59.4 kg)     Height 10/18/21 0029 '5\' 3"'$  (1.6 m)  Head Circumference --      Peak Flow --      Pain Score 10/18/21 0027 2     Pain Loc --      Pain Edu? --      Excl. in [place]? --      Updated Vital Signs: BP (!) 157/76   Pulse 80   Temp 98.4 F (36.9 C) (Oral)   Resp 12   Ht '5\' 3"'$  (1.6 m)   Wt 59.4 kg   SpO2 96%   BMI 23.21 kg/m    General: Awake, no distress.  CV:  RRR.  Good peripheral perfusion.  Resp:  Normal effort.  CTA B. Abd:  Nontender.  No distention.  Other:  Aspen collar removed and soft cervical collar applied.  Removed surgical dressing.  Operative site clean/dry/intact without hematoma.  Alert and oriented x3.  CN II toXII Slee intact.  5/5 motor strength and sensation all extremities.   ED Results / Procedures / Treatments  Labs (all labs ordered are listed, but only abnormal results are displayed) Labs Reviewed  LACTIC ACID, PLASMA - Abnormal; Notable for the following components:      Result Value   Lactic Acid, Venous 3.0 (*)    All other components within normal limits  CBC WITH DIFFERENTIAL/PLATELET - Abnormal; Notable for the following components:   WBC 12.0 (*)    RBC 5.27 (*)    Hemoglobin 15.6 (*)    HCT 47.5 (*)    Neutro Abs 8.1 (*)    All other components within normal limits  COMPREHENSIVE METABOLIC PANEL - Abnormal; Notable for the following components:   Potassium 3.1 (*)    Glucose, Bld 158 (*)    Total Protein 8.3 (*)    AST 46 (*)    All other components within normal limits  URINALYSIS, ROUTINE W REFLEX MICROSCOPIC - Abnormal; Notable for the following components:   Color, Urine YELLOW (*)    APPearance HAZY (*)    Protein, ur 30 (*)    All other components within normal limits  CULTURE, BLOOD (ROUTINE X 2)  CULTURE, BLOOD (ROUTINE X 2)  URINE CULTURE  SARS CORONAVIRUS 2 BY RT PCR  LACTIC ACID, PLASMA  PROCALCITONIN  TROPONIN I (HIGH SENSITIVITY)  TROPONIN I (HIGH SENSITIVITY)     EKG  ED ECG REPORT I, Idali Lafever J, the attending physician, personally viewed and interpreted this ECG.   Date: 10/18/2021  EKG Time: 0102  Rate: 71  Rhythm: normal sinus rhythm  Axis: Normal  Intervals:none   ST&T Change: Nonspecific    RADIOLOGY I have independently visualized and interpreted patient's chest x-ray, CT head, MRI brain, MRI cervical spine, MRI lumbar spine as well as noted the radiology interpretation:  Chest x-ray: No acute cardiopulmonary process  CT head: No ICH  MRI brain: Unremarkable for acute abnormality  MRI cervical spine: Postoperative changes without complication  MRI lumbar spine: No acute abnormality or cord compression  Official radiology report(s): MR LUMBAR SPINE WO CONTRAST  Result Date: 10/18/2021 CLINICAL DATA:  Initial evaluation for bilateral leg weakness, recent cervical fusion. EXAM: MRI LUMBAR SPINE WITHOUT CONTRAST TECHNIQUE: Multiplanar, multisequence MR imaging of the lumbar spine was performed. No intravenous contrast was administered. COMPARISON:  None available. FINDINGS: Segmentation: Standard. Lowest well-formed disc space labeled the L5-S1 level. Alignment: Trace retrolisthesis of L2 on L3, with trace anterolisthesis of L3 on L4. Findings likely chronic and facet mediated. Alignment otherwise normal preservation of the normal lumbar lordosis. Vertebrae: Vertebral  body height maintained without acute or chronic fracture. Bone marrow signal intensity within normal limits. No discrete or worrisome osseous lesions. No abnormal marrow edema. Conus medullaris and cauda equina: Conus extends to the T12 level. Conus and cauda equina appear normal. Paraspinal and other soft tissues: Paraspinous soft tissues within normal limits. Asymmetric left renal atrophy noted. Disc levels: T12-L1: Left subarticular disc extrusion with inferior migration. No significant canal or lateral recess stenosis. Foramina remain patent. L1-2:  Negative interspace.  Mild facet hypertrophy.  No stenosis. L2-3: Mild disc bulge with disc desiccation. Superimposed right foraminal to extraforaminal disc protrusion closely approximates the exiting right L2 nerve root. Mild right greater than  left facet hypertrophy. No significant spinal stenosis. Foramina remain patent. L3-4: Mild disc bulge with disc desiccation. Superimposed right foraminal to extraforaminal disc protrusion closely approximates the exiting right L3 nerve root. Moderate facet hypertrophy. Resultant mild spinal stenosis. Mild right L3 foraminal narrowing. Left neural foramen remains patent. L4-5: Minimal annular disc bulge. Mild facet hypertrophy. No spinal stenosis. Foramina remain patent. L5-S1: Normal interspace. Mild facet hypertrophy. No canal or foraminal stenosis. IMPRESSION: 1. No acute abnormality within the lumbar spine. No evidence for cord compression or high-grade spinal stenosis. 2. Small right foraminal to extraforaminal disc protrusions at L2-3 and L3-4, closely approximating and potentially irritating the exiting right L2 and L3 nerve roots respectively. 3. Left subarticular disc extrusion with inferior migration at T12-L1 without significant stenosis or impingement. Electronically Signed   By: Jeannine Boga M.D.   On: 10/18/2021 04:51   MR Cervical Spine W or Wo Contrast  Result Date: 10/18/2021 CLINICAL DATA:  Initial evaluation for neck pain with bilateral leg weakness, recent surgery. EXAM: MRI CERVICAL SPINE WITHOUT AND WITH CONTRAST TECHNIQUE: Multiplanar and multiecho pulse sequences of the cervical spine, to include the craniocervical junction and cervicothoracic junction, were obtained without and with intravenous contrast. CONTRAST:  15m GADAVIST GADOBUTROL 1 MMOL/ML IV SOLN COMPARISON:  Prior MRI from 01/15/2014. FINDINGS: Alignment: Straightening of the normal cervical lordosis. Trace anterolisthesis of C4 on C5. Vertebrae: Susceptibility artifact related to prior ACDF at C5-C7. No visible hardware complication by MRI. Vertebral body height maintained without acute or chronic fracture. Bone marrow signal intensity within normal limits. No discrete or worrisome osseous lesions. No abnormal marrow  edema or enhancement. No findings of osteomyelitis discitis or septic arthritis. Cord: Normal signal and morphology. No abnormal enhancement. No epidural collections. Posterior Fossa, vertebral arteries, paraspinal tissues: Visualized brain and posterior fossa within normal limits. Craniocervical junction normal. Paraspinous soft tissues within normal limits. Normal flow voids seen within the vertebral arteries bilaterally. Disc levels: C2-C3: Negative interspace. Minimal facet spurring. No canal or foraminal stenosis. C3-C4: Minimal disc bulge with uncovertebral spurring. No spinal stenosis. Foramina remain patent. C4-C5: Trace anterolisthesis. Mild disc bulge with uncovertebral hypertrophy. Moderate right-sided facet arthrosis. No spinal stenosis. Mild to moderate right C5 foraminal narrowing. Left neural foramen remains patent. C5-C6: Prior fusion. No significant residual spinal stenosis. Uncovertebral spurring with residual mild to moderate bilateral foraminal narrowing. C6-C7: Prior fusion. No residual spinal stenosis. Uncovertebral spurring with residual moderate left with mild right C7 foraminal stenosis. C7-T1:  Unremarkable. Visualized upper thoracic spine demonstrates no significant finding. IMPRESSION: 1. Postoperative changes from recent ACDF at CR6-V8without complication. No significant residual spinal stenosis at these levels. Residual mild to moderate bilateral C6 and left C7 foraminal narrowing related to residual uncovertebral disease. 2. Mild disc bulge with uncovertebral hypertrophy at C4-5 with resultant mild to moderate right C5  foraminal stenosis. Electronically Signed   By: Jeannine Boga M.D.   On: 10/18/2021 04:45   MR BRAIN WO CONTRAST  Result Date: 10/18/2021 CLINICAL DATA:  Initial evaluation for altered mental status. Recent cervical fusion. EXAM: MRI HEAD WITHOUT CONTRAST TECHNIQUE: Multiplanar, multiecho pulse sequences of the brain and surrounding structures were obtained  without intravenous contrast. COMPARISON:  Prior CT from earlier the same day. FINDINGS: Brain: Cerebral volume within normal limits. Mild scattered patchy T2/FLAIR hyperintensity involving the periventricular and deep white matter both cerebral hemispheres as well as the pons, most characteristic of chronic small vessel ischemic disease, mild to moderate in nature. No evidence for acute or subacute infarct. Gray-white matter differentiation well maintained. No areas of chronic cortical infarction. No acute or chronic intracranial blood products. No mass lesion, midline shift or mass effect. No hydrocephalus or extra-axial fluid collection. Pituitary gland and suprasellar region within normal limits. Vascular: Major intracranial vascular flow voids are maintained. Skull and upper cervical spine: Craniocervical junction within normal limits. Bone marrow signal intensity normal. No scalp soft tissue abnormality. Sinuses/Orbits: Prior ocular lens replacement on the left. Globes and orbital soft tissues demonstrate no acute finding. Paranasal sinuses are largely clear. No mastoid effusion. Other: None. IMPRESSION: 1. No acute intracranial abnormality. 2. Mild to moderate chronic microvascular ischemic disease for age. Electronically Signed   By: Jeannine Boga M.D.   On: 10/18/2021 04:37   DG Chest Port 1 View  Result Date: 10/18/2021 CLINICAL DATA:  Weakness. EXAM: PORTABLE CHEST 1 VIEW COMPARISON:  Chest radiograph dated 04/30/2019. FINDINGS: Background of emphysema. No focal consolidation, pleural effusion or pneumothorax. The cardiac silhouette is within limits. Atherosclerotic calcification of the aorta. No acute osseous pathology. IMPRESSION: No active cardiopulmonary disease. Electronically Signed   By: Anner Crete M.D.   On: 10/18/2021 01:05   CT Head Wo Contrast  Result Date: 10/18/2021 CLINICAL DATA:  Mental status change, unknown cause EXAM: CT HEAD WITHOUT CONTRAST TECHNIQUE: Contiguous  axial images were obtained from the base of the skull through the vertex without intravenous contrast. RADIATION DOSE REDUCTION: This exam was performed according to the departmental dose-optimization program which includes automated exposure control, adjustment of the mA and/or kV according to patient size and/or use of iterative reconstruction technique. COMPARISON:  None Available. FINDINGS: Brain: Normal anatomic configuration. Parenchymal volume loss is commensurate with the patient's age. Mild periventricular white matter changes are present likely reflecting the sequela of small vessel ischemia. No abnormal intra or extra-axial mass lesion or fluid collection. No abnormal mass effect or midline shift. No evidence of acute intracranial hemorrhage or infarct. Ventricular size is normal. Cerebellum unremarkable. Vascular: No asymmetric hyperdense vasculature at the skull base. Skull: Intact Sinuses/Orbits: Paranasal sinuses are clear. Orbits are unremarkable. Other: Mastoid air cells and middle ear cavities are clear. IMPRESSION: No acute intracranial abnormality. Mild senescent change. Electronically Signed   By: Fidela Salisbury M.D.   On: 10/18/2021 00:56     PROCEDURES:  Critical Care performed: Yes, see critical care procedure note(s)  CRITICAL CARE Performed by: Paulette Blanch   Total critical care time: 45 minutes  Critical care time was exclusive of separately billable procedures and treating other patients.  Critical care was necessary to treat or prevent imminent or life-threatening deterioration.  Critical care was time spent personally by me on the following activities: development of treatment plan with patient and/or surrogate as well as nursing, discussions with consultants, evaluation of patient's response to treatment, examination of patient, obtaining history from  patient or surrogate, ordering and performing treatments and interventions, ordering and review of laboratory studies,  ordering and review of radiographic studies, pulse oximetry and re-evaluation of patient's condition.   [name]1-3 Lead EKG Interpretation  Performed by: Paulette Blanch, MD Authorized by: Paulette Blanch, MD     Interpretation: normal     ECG rate:  80   ECG rate assessment: normal     Rhythm: sinus rhythm     Ectopy: none     Conduction: normal   Comments:     Patient placed on cardiac monitor to evaluate for arrhythmias    MEDICATIONS ORDERED IN ED: Medications  vancomycin (VANCOREADY) IVPB 1250 mg/250 mL (1,250 mg Intravenous New Bag/Given 10/18/21 0444)  sodium chloride 0.9 % bolus 1,000 mL (0 mLs Intravenous Stopped 10/18/21 0207)  morphine (PF) 4 MG/ML injection 4 mg (4 mg Intravenous Given 10/18/21 0207)  ondansetron (ZOFRAN) injection 4 mg (4 mg Intravenous Given 10/18/21 0207)  ceFEPIme (MAXIPIME) 2 g in sodium chloride 0.9 % 100 mL IVPB (0 g Intravenous Stopped 10/18/21 0401)  metroNIDAZOLE (FLAGYL) IVPB 500 mg (500 mg Intravenous New Bag/Given 10/18/21 0355)  sodium chloride 0.9 % bolus 1,000 mL (1,000 mLs Intravenous New Bag/Given 10/18/21 0329)    And  sodium chloride 0.9 % bolus 1,000 mL (1,000 mLs Intravenous New Bag/Given 10/18/21 0329)  gadobutrol (GADAVIST) 1 MMOL/ML injection 7 mL (7 mLs Intravenous Contrast Given 10/18/21 0315)  0.9 % NaCl with KCl 20 mEq/ L  infusion ( Intravenous New Bag/Given 10/18/21 0347)     IMPRESSION / MDM / [place] / ED COURSE  I reviewed the triage vital signs and the nursing notes.                             66 year old female presenting with bilateral leg weakness causing her to near fall.  Differential diagnosis includes but is not limited to CVA, TIA, spinal emergency, infectious, metabolic etiologies, etc.  I have personally reviewed patient's records and unfortunately they are not able to see the patient's op note from Dr. Caprice Red but I can see an office visit from 10/06/2021 for surgical planning:Pre-op Dx: Extruded disc at C5-6 with  mildly myelopathic. C6 radiculopathy on the left.  Anticipated Procedure: C6 corpectomy, anterior plating of C5-C7, possible allograft  Patient's presentation is most consistent with acute presentation with potential threat to life or bodily function.  The patient is on the cardiac monitor to evaluate for evidence of arrhythmia and/or significant heart rate changes.  We will obtain sepsis work-up.  Start with chest x-ray and CT head to evaluate for intracranial hemorrhage.  Consider MRI if that is negative.  Would obtain MRI of the brain to evaluate for CVA, MRI cervical spine with and without contrast given patient's recent surgery, and MRI lumbar spine given her bilateral leg weakness.  Clinical Course as of 10/18/21 0505  Wed Oct 18, 2021  0214 Lactic acid 3.0; code sepsis initiated.  Patient currently in MRI. [JS]  0427 Sepsis reassessment: Lactic acid has normalized.  Patient remains hemodynamically stable.  Possibly elevated lactic acid secondary to clinical dehydration, decreased oral intake.  At this time we are awaiting results of MRI. [JS]  4098 MRI brain unremarkable for acute abnormality. [JS]  1191 MRI cervical spine negative for acute postoperative abnormality.  MRI lumbar spine negative for acute neurosurgical emergency.  Will consult hospitalist services for evaluation and admission for generalized weakness. [JS]  Clinical Course User Index [JS] Paulette Blanch, MD     FINAL CLINICAL IMPRESSION(S) / ED DIAGNOSES   Final diagnoses:  Sepsis, due to unspecified organism, unspecified whether acute organ dysfunction present ([place])  Weakness generalized  Hypokalemia     Rx / DC Orders   ED Discharge Orders     None        Note:  This document was prepared using Dragon voice recognition software and may include unintentional dictation errors.   Paulette Blanch, MD 10/18/21 [phone]

## 2021-10-18 NOTE — Evaluation (Signed)
Occupational Therapy Evaluation Patient Details Name: Brina Umeda MRN: [id number] DOB: 1955/05/08 Today's Date: 10/18/2021   History of Present Illness Haydyn Liddell is a 66 y.o. female with medical history significant of hypertension, hyperlipidemia, diastolic CHF last EF 97-98% with grade 1 DD, nonobstructive CAD, PAD, anxiety, and depression who presents with complaints of weakness in her legs.  She had been having issues with pain on the left shoulder and arm and just underwent cervical fusion with Dr. Kayleen Memos of [facility] a little over a week ago.  Following the surgery patient had not been eating or drinking very much due to lack of appetite and difficulty swallowing.  She also has to wear a cervical collar for at least 6 weeks for which it makes it difficult to eat.  Appetite had improved this week some.  Yesterday evening she was going to the bathroom when her legs "gave out".  Her husband was able to catch her and got her down to the floor.  She did not hit her head or lose consciousness.  She did not feel lightheaded or dizzy prior to this episode.  She has had syncope before and this felt nothing like it.   Clinical Impression   Patient presenting with decreased Ind in self care, balance, functional mobility/transfers, endurance, and safety awareness. Patient reports living at home with husband who is able to provide assist for pt at discharge. OT reviewed how to safely don/doff collar from bed to remove and change pads for hygiene. Patient initially reporting no numbness or tingling in extremities. Pt ambulates while pushing IV pole with min guard. She stands from [facility] with min guard and on way back to room she stops and reports, "My legs are numb". Pt appears to be shaking with the effort to remain upright and B knees buckling. OT holding pt up for safety and RN arrives with chair to lower pt into. BP taken while seated and in standing with no significant change noted. After rest break,  pt reports numbness no longer present and OT ambulates pt back into room with chair follow for safety. Pt is very upset and tearful. MD notified via secure chat.  Patient will benefit from acute OT to increase overall independence in the areas of ADLs, functional mobility, and safety awareness in order to safely discharge home.    Recommendations for follow up therapy are one component of a multi-disciplinary discharge planning process, led by the attending physician.  Recommendations may be updated based on patient status, additional functional criteria and insurance authorization.   Follow Up Recommendations  Home health OT    Assistance Recommended at Discharge Frequent or constant Supervision/Assistance  Patient can return home with the following A little help with walking and/or transfers;A little help with bathing/dressing/bathroom;Assistance with cooking/housework;Help with stairs or ramp for entrance;Assist for transportation    Functional Status Assessment  Patient has had a recent decline in their functional status and demonstrates the ability to make significant improvements in function in a reasonable and predictable amount of time.  Equipment Recommendations  None recommended by OT       Precautions / Restrictions Precautions Precautions: Cervical Required Braces or Orthoses: Cervical Brace Cervical Brace: Hard collar;At all times Restrictions Weight Bearing Restrictions: No      Mobility Bed Mobility Overal bed mobility: Modified Independent Bed Mobility: Supine to Sit, Sit to Supine           General bed mobility comments: increased time and effort but no physical assistance  Transfers Overall transfer level: Needs assistance Equipment used: 1 person hand held assist Transfers: Sit to/from Stand, Bed to chair/wheelchair/BSC Sit to Stand: Supervision     Step pivot transfers: Min guard            Balance Overall balance assessment: Needs  assistance Sitting-balance support: Feet supported Sitting balance-Leahy Scale: Good     Standing balance support: During functional activity, Single extremity supported Standing balance-Leahy Scale: Fair                             ADL either performed or assessed with clinical judgement   ADL                                         General ADL Comments: supervision for sitting EOB to don/doff socks with figure four position and min guard for toilet transfer. Pt pushes IV pole ~ 50'with min guard.     Vision Patient Visual Report: No change from baseline Vision Assessment?: No apparent visual deficits            Pertinent Vitals/Pain Pain Assessment Pain Assessment: No/denies pain     Hand Dominance Right   Extremity/Trunk Assessment Upper Extremity Assessment Upper Extremity Assessment: Generalized weakness   Lower Extremity Assessment Lower Extremity Assessment: Generalized weakness       Communication Communication Communication: No difficulties   Cognition Arousal/Alertness: Awake/alert Behavior During Therapy: WFL for tasks assessed/performed Overall Cognitive Status: Within Functional Limits for tasks assessed                                                  Home Living Family/patient expects to be discharged to:: Private residence Living Arrangements: Spouse/significant other Available Help at Discharge: Family;Available 24 hours/day Type of Home: House Home Access: Stairs to enter [employer] of Steps: 2 Entrance Stairs-Rails: Right;Left Home Layout: One level     Bathroom Shower/Tub: Chief Strategy Officer: None          Prior Functioning/Environment Prior Level of Function : Independent/Modified Independent                        OT Problem List: Decreased strength;Impaired sensation;Decreased activity tolerance;Decreased safety awareness;Impaired  balance (sitting and/or standing);Decreased knowledge of use of DME or AE;Decreased knowledge of precautions      OT Treatment/Interventions: Self-care/ADL training;Therapeutic exercise;Energy conservation;DME and/or AE instruction;Manual therapy;Balance training;Patient/family education    OT Goals(Current goals can be found in the care plan section) Acute Rehab OT Goals Patient Stated Goal: to go home OT Goal Formulation: With patient Time For Goal Achievement: 11/01/21 Potential to Achieve Goals: Fair ADL Goals Pt Will Perform Grooming: with modified independence;standing Pt Will Perform Lower Body Dressing: with modified independence;sit to/from stand Pt Will Transfer to Toilet: with modified independence;ambulating Pt Will Perform Toileting - Clothing Manipulation and hygiene: with modified independence;sit to/from stand  OT Frequency: Min 2X/week       AM-PAC OT "6 Clicks" Daily Activity     Outcome Measure Help from another person eating meals?: None Help from another person taking care of personal grooming?: None Help from another person toileting,  which includes using toliet, bedpan, or urinal?: A Little Help from another person bathing (including washing, rinsing, drying)?: A Little Help from another person to put on and taking off regular upper body clothing?: None Help from another person to put on and taking off regular lower body clothing?: A Little 6 Click Score: 21   End of Session Nurse Communication: Mobility status  Activity Tolerance: Patient tolerated treatment well Patient left: in bed;with call bell/phone within reach;with bed alarm set  OT Visit Diagnosis: Unsteadiness on feet (R26.81);Repeated falls (R29.6);Muscle weakness (generalized) (M62.81)                Time: 1325-1350 OT Time Calculation (min): 25 min Charges:  OT General Charges $OT Visit: 1 Visit OT Evaluation $OT Eval Moderate Complexity: 1 Mod OT Treatments $Self Care/Home Management :  8-22 mins  Darleen Crocker, MS, OTR/L , CBIS ascom [phone]  10/18/21, 3:36 PM

## 2021-10-18 NOTE — ED Notes (Signed)
Patient returned from MRI.

## 2021-10-18 NOTE — H&P (Addendum)
History and Physical    Patient: Katelyn Cooper TWS:[id number] DOB: 18-Mar-1956 DOA: 10/18/2021 DOS: the patient was seen and examined on 10/18/2021 PCP: [name], MD  Patient coming from: Home via EMS  Chief Complaint:  Chief Complaint  Patient presents with   Legs not working   HPI: Katelyn Cooper is a 66 y.o. female with medical history significant of hypertension, hyperlipidemia, diastolic CHF last EF 00-17% with grade 1 DD, nonobstructive CAD, PAD, anxiety, and depression who presents with complaints of weakness in her legs.  She had been having issues with pain on the left shoulder and arm and just underwent cervical fusion with Dr. Kayleen Memos of [facility] a little over a week ago.  Following the surgery patient had not been eating or drinking very much due to lack of appetite and difficulty swallowing.  She also has to wear a cervical collar for at least 6 weeks for which it makes it difficult to eat.  Appetite had improved this week some.  Yesterday evening she was going to the bathroom when her legs "gave out".  Her husband was able to catch her and got her down to the floor.  She did not hit her head or lose consciousness.  She did not feel lightheaded or dizzy prior to this episode.  She has had syncope before and this felt nothing like it.  Denied having any fever, chills, cough, chest pain, shortness of breath, nausea, vomiting, abdominal pain, diarrhea, saddle anesthesia, inability to void, or numbness/tingling sensations.  She normally would take Lasix every other day, but since the surgery had not been taking it due to her limited p.o. intake.   On admission into the emergency department patient was noted to be afebrile with respirations 11-22, heart rates maintained, blood pressure 100/60-158/72, and O2 saturations currently maintained on room air.  CT scan of the head did not note any acute abnormality.  Labs significant for WBC 12, hemoglobin 15.6, potassium 3.1 , AST 46,  high-sensitivity troponin negative x2, procalcitonin < 0.1, and lactic acid 3->1.9.  Urinalysis did not note significant signs of infection and chest x-ray was clear.  Blood cultures had been obtained.  Patient had been given bolus of 2 L of normal saline IV fluids, Zofran, vancomycin, metronidazole, and cefepime.  MRI of the brain, cervical spine, and lumbar spine were performed.  MRI of the head did not note any signs of stroke.  Review of Systems: As mentioned in the history of present illness. All other systems reviewed and are negative. Past Medical History:  Diagnosis Date   (HFpEF) heart failure with preserved ejection fraction ([place])    a. 12/2015 Echo: EF 55-60%, nl RV size/fxn, no significant valvular abnormalities; b. 10/2017 Echo: EF 50-55%, antsept, ant HK. Gr2 DD. Mild MR. Nl RV fxn. PASP 35mHg.   Anxiety    Basal cell carcinoma 08/08/2017   L nose above mid nasal alar groove   CHF (congestive heart failure) (HCC)    Depression    Diabetes type 2, controlled ([place]    diet-controlled   Esophageal stricture    GERD (gastroesophageal reflux disease)    Hiatal hernia    History of chicken pox    Hyperlipidemia    Hypertension    Internal hemorrhoids    Non-obstructive CAD (coronary artery disease)    a. 12/2015 MV: apical ant and apical reversible defect. EF 55-65%; b. 01/2016 Cath: LM nl, LAD nl, SP1 40ost, LCX nl, OM1 30, RCA 30p/m, EDP 15-273mg;  c. 10/2017 Cath: LM nl, LAD nl, SP1 30ost, LCX nl, OM1 30, RCA 30p/m. EF 55%.   Obstructive sleep apnea    PAD (peripheral artery disease) ([place])    a. 1990's s/p prior LCIA stenting; b. 12/2015 ABI: R 1.05, L 1.03.   Panic disorder    Past Surgical History:  Procedure Laterality Date   AORTA SURGERY  1999   stent placement; ? left iliac artery intervention   APPENDECTOMY  1998   BASAL CELL CARCINOMA EXCISION     CARDIAC CATHETERIZATION N/A 01/12/2016   Procedure: Left Heart Cath and Coronary Angiography;  Surgeon: Nelva Bush,  MD;  Location: [facility];  Service: Cardiovascular;  Laterality: N/A;   COLONOSCOPY  07/17/2019   FOOT SURGERY     Right   pap smear  03/2010   Done by [name], normal   REFRACTIVE SURGERY     right   RIB RESECTION  [id number]   RIGHT/LEFT HEART CATH AND CORONARY ANGIOGRAPHY N/A 11/08/2017   Procedure: RIGHT/LEFT HEART CATH AND CORONARY ANGIOGRAPHY;  Surgeon: Wellington Hampshire, MD;  Location: [facility];  Service: Cardiovascular;  Laterality: N/A;   SHOULDER SURGERY  2005   left   UPPER GASTROINTESTINAL ENDOSCOPY  07/17/2019   UPPER GI ENDOSCOPY  09/19/2011   Stricture at GE junction, dilated. Mild gastritis and duodenitis. Small hiatal hernia   Social History:  reports that she has been smoking cigarettes. She started smoking about 46 years ago. She has a 40.00 pack-year smoking history. She has never used smokeless tobacco. She reports that she does not drink alcohol and does not use drugs.  Allergies  Allergen Reactions   Lovastatin     depression   Paroxetine Hcl     itching   Lamotrigine Rash    Family History  Problem Relation Age of Onset   Lung cancer Mother        was a smoker   Emphysema Mother    Cancer Mother        Lung   Alcohol abuse Mother    Depression Mother    Heart disease Mother    Drug abuse Sister    Breast cancer Sister        x 2  (30&50)   Emphysema Sister    Bipolar disorder Sister    Alcohol abuse Other    Depression Other    Bipolar disorder Other    Heart disease Other    Vision loss Other    Alcohol abuse Father    Mood Disorder Father    Heart disease Maternal Grandmother    Stroke Maternal Grandmother    Esophageal cancer Maternal Aunt    Colon cancer Neg Hx    Colon polyps Neg Hx    Rectal cancer Neg Hx    Stomach cancer Neg Hx     Prior to Admission medications   Medication Sig Start Date End Date Taking? Authorizing Provider  aspirin EC 81 MG tablet Take 1 tablet (81 mg total) by mouth daily.  12/12/15  Yes End, Harrell Gave, MD  atorvastatin (LIPITOR) 80 MG tablet Take 1 tablet (80 mg total) by mouth daily. 08/31/21  Yes End, Harrell Gave, MD  b complex vitamins capsule Take 1 capsule by mouth daily.   Yes [provider]  Cholecalciferol (VITAMIN D3) 5000 units TABS Take 1 tablet by mouth daily.  04/09/16  Yes [provider]  docusate sodium (COLACE) 100 MG capsule Take 100 mg by  mouth daily as needed. 10/11/21  Yes [provider]  gabapentin (NEURONTIN) 300 MG capsule Take 300 mg by mouth 3 (three) times daily.   Yes [provider]  glucose blood test strip ONETOUCH ULTRA MINI STRIPS AND LANCETS. Use one daily to check blood sugar. 11/18/17  Yes [name], MD  lansoprazole (PREVACID) 15 MG capsule Take 15 mg by mouth daily at 6 (six) AM.   Yes [provider]  metFORMIN (GLUCOPHAGE-XR) 500 MG 24 hr tablet TAKE 1 TABLET BY MOUTH EVERY EVENING . GENERIC EQUIVALENT FOR GLUCOPHAGE-XR 02/14/21  Yes [name], MD  methocarbamol (ROBAXIN) 500 MG tablet Take 500 mg by mouth 2 (two) times daily. 09/18/21  Yes [provider]  methocarbamol (ROBAXIN-750) 750 MG tablet Take 1 tablet (750 mg total) by mouth 4 (four) times daily. 08/14/21  Yes [name], MD  metoprolol tartrate (LOPRESSOR) 25 MG tablet Take 1 tablet (25 mg total) by mouth 2 (two) times daily. 08/31/21  Yes End, Harrell Gave, MD  Multiple Vitamins-Minerals (MULTI-VITAMIN GUMMIES PO) Take by mouth. Taking 1 daily   Yes [provider]  ondansetron (ZOFRAN-ODT) 4 MG disintegrating tablet 4 mg 2 (two) times daily as needed. 10/11/21  Yes [provider]  oxyCODONE (OXY IR/ROXICODONE) 5 MG immediate release tablet Take 5 mg by mouth every 4 (four) hours as needed. 10/11/21  Yes [provider]  QUEtiapine (SEROQUEL) 100 MG tablet Take 1 tablet (100 mg total) by mouth at bedtime. 09/08/21 12/07/21 Yes Hisada, Elie Goody, MD  traMADol (ULTRAM) 50 MG tablet Take  50 mg by mouth every 6 (six) hours. 10/06/21  Yes [provider]  venlafaxine XR (EFFEXOR-XR) 75 MG 24 hr capsule Take 1 capsule (75 mg total) by mouth daily with breakfast. 10/04/21 01/02/22 Yes Hisada, Elie Goody, MD  zaleplon (SONATA) 10 MG capsule Take 1 capsule (10 mg total) by mouth at bedtime as needed for sleep. 10/01/21 12/30/21 Yes Hisada, Elie Goody, MD  acetaminophen (TYLENOL) 500 MG tablet Take 500 mg by mouth every 6 (six) hours as needed.    [provider]  albuterol (VENTOLIN HFA) 108 (90 Base) MCG/ACT inhaler Ventolin HFA 90 mcg/actuation aerosol inhaler  inhale 2 puffs by mouth every 6 hours if needed for wheezing or shortness of breath    [provider]  clobetasol ointment (TEMOVATE) 0.05 % clobetasol 0.05 % topical ointment  PLEASE SEE ATTACHED FOR DETAILED DIRECTIONS Patient not taking: Reported on 10/18/2021    [provider]  furosemide (LASIX) 20 MG tablet TAKE 1 TABLET DAILY PRN FOR INCREASED FLUID OR WEIGHT GAIN 08/31/21   End, Harrell Gave, MD  HYDROcodone-acetaminophen (NORCO/VICODIN) 5-325 MG tablet Take 1 tablet by mouth every 6 (six) hours as needed for moderate pain. Patient not taking: Reported on 10/18/2021 08/21/21   [name], MD  nabumetone (RELAFEN) 750 MG tablet Take 1 tablet (750 mg total) by mouth 2 (two) times daily. Patient not taking: Reported on 10/18/2021 08/14/21   [name], MD  potassium chloride SA (KLOR-CON M20) 20 MEQ tablet Take 2 tablets (40 meq) by mouth once daily only on days you are taking lasix 07/04/21   Nelva Bush, MD    Physical Exam: Vitals:   10/18/21 0430 10/18/21 0445 10/18/21 0500 10/18/21 0530  BP: (!) 150/94 (!) 130/27 (!) 142/65 100/60  Pulse: 82 83 86 85  Resp: 15 18 (!) 21 15  Temp:      TempSrc:      SpO2: 96% 97% [name])  88% 96%  Weight:      Height:        Constitutional: Elderly female currently in no acute distress Eyes: PERRL, lids and conjunctivae normal ENMT: Mucous membranes  are dry.  Posterior pharynx clear of any exudate or lesions.  Neck: Patient currently in a cervical collar.  Surgical wound intact appears to be healing with no erythema Respiratory: clear to auscultation bilaterally, no wheezing, no crackles. Normal respiratory effort. No accessory muscle use.  Cardiovascular: Regular rate and rhythm, no murmurs / rubs / gallops. No extremity edema. 2+ pedal pulses.   Abdomen: no tenderness, no masses palpated.  Bowel sounds positive.  Musculoskeletal: no clubbing / cyanosis. No joint deformity upper and lower extremities.   Skin: no rashes, lesions, ulcers. No induration Neurologic: CN 2-12 grossly intact. Sensation intact,  Strength 5/5 in all 4.  Psychiatric: Normal judgment and insight. Alert and oriented x 3. Normal mood.   Data Reviewed:  EKG reveals sinus rhythm at 71 bpm  Assessment and Plan: SIRS lactic acidosis Acute patient was noted to be tachypneic with initial white blood cell count of 12 and lactic acid of 3.  Her surgical site was noted to be healing well.  Blood cultures have been obtained and patient was started on empiric antibiotics of vancomycin, cefepime, and metronidazole given recent surgery.  Work-up including chest x-ray, urinalysis, and MRIs did not give clear source of patient's symptoms. Thought possibly secondary to dehydration as lactic acid resolved with no fluids. -Admit to a MedSurg bed -Follow-up blood cultures -Continue empiric antibiotics of vancomycin and Zosyn.  De-escalate when medically appropriate -Touched base with [facility] by Dr. Kayleen Memos at in the office and they were not able to view images and therefore did not give any additional recommendations.  Bilateral leg weakness Acute.  Patient presents with lower extremity leg weakness.  MRIs of the head, cervical spine, and lumbar spine without clear acute abnormality to cause patient's symptoms.  Question if related to patient's poor p.o. intake. -PT/OT to eval  and treat  Recent cervical fusion Patient underwent cervical fusion by Dr. Kayleen Memos on [facility] on 7/11. -Consulted [facility]  Polycythemia Acute. Hemoglobin 15.6 g/dL thought secondary hemoconcentration and dehydration  as hemoglobin previous had been within normal limits. -Continue IV fluids -Recheck CBC tomorrow morning  Hypokalemia Acute.  Potassium 3.1 on admission.  Patient had been given potassium chloride 20 mEq with IV fluids. -Give an additional 40 mEq of potassium chloride p.o. -Monitor lites as needed  Essential hypertension Blood pressures currently maintained.  Blood pressure regimen includes metoprolol 25 mg twice daily and Lasix 20 mg as needed increased fluid weight gain. -Continue metoprolol -Held Lasix  Elevated AST Acute.  AST elevated at 46.   Patient denies any significant history of alcohol use.  Possibly reactive in nature. -Recheck labs in a.m.  Controlled diabetes mellitus type 2 Last hemoglobin A1c 6.6 . Home medication regimen includes metformin XR 500 mg. -Hold metformin -Continue heart healthy carb modified diet  Hyperlipidemia -Continue atorvastatin  Depression   -Continue Seroquel and Zoloft  GERD Home medication regimen includes Prevacid 15 mg daily -Continue pharmacy substitution of Protonix 20 mg daily  Tobacco use Patient reports that she still continues to smoke cigarettes. -Nicotine patch offered  DVT prophylaxis: Lovenox Advance Care Planning:   Code Status: Full Code    Consults: [facility]  Family Communication: Attempted to call patient's husband  Severity of Illness: The appropriate patient status for this patient is OBSERVATION. Observation status is  judged to be reasonable and necessary in order to provide the required intensity of service to ensure the patient's safety. The patient's presenting symptoms, physical exam findings, and initial radiographic and laboratory data in the context of their medical condition  is felt to place them at decreased risk for further clinical deterioration. Furthermore, it is anticipated that the patient will be medically stable for discharge from the hospital within 2 midnights of admission.   Author: Norval Morton, MD 10/18/2021 8:30 AM  For on call review [email or web address].

## 2021-10-18 NOTE — ED Notes (Signed)
Patient transported to MRI 

## 2021-10-18 NOTE — Consult Note (Signed)
PHARMACY -  BRIEF ANTIBIOTIC NOTE   Pharmacy has received consult(s) for cefepime and vancomycin from an ED provider.  The patient's profile has been reviewed for ht/wt/allergies/indication/available labs.    One time order(s) placed for  Cefepime 2 gram Vancomycin 1250 mg  Further antibiotics/pharmacy consults should be ordered by admitting physician if indicated.                       Thank you, Dorothe Pea, PharmD, BCPS Clinical Pharmacist   10/18/2021  2:18 AM

## 2021-10-18 NOTE — ED Triage Notes (Signed)
Presents with C collar in place. Reports cervical fusion last week. Reports was going to the bathroom tonight and legs went numb bilaterally and pt had assisted fall to floor. Did not lose consciousness or hit head. Pt reports continued sensation of weakness and numbness, especially while standing. Denies numbness or weakness elsewhere. Pt alert and oriented on arrival. Breathing unlabored with symmetric chest rise and fall and speaking in full sentences.

## 2021-10-18 NOTE — Sepsis Progress Note (Signed)
Following per sepsis protocol   

## 2021-10-19 DIAGNOSIS — R531 Weakness: Secondary | ICD-10-CM | POA: Diagnosis not present

## 2021-10-19 DIAGNOSIS — M6281 Muscle weakness (generalized): Secondary | ICD-10-CM | POA: Diagnosis not present

## 2021-10-19 DIAGNOSIS — R6510 Systemic inflammatory response syndrome (SIRS) of non-infectious origin without acute organ dysfunction: Secondary | ICD-10-CM | POA: Diagnosis not present

## 2021-10-19 DIAGNOSIS — M6510 Other infective (teno)synovitis, unspecified site: Secondary | ICD-10-CM | POA: Diagnosis not present

## 2021-10-19 LAB — CBC
HCT: 40.6 % (ref 36.0–46.0)
Hemoglobin: 13.2 g/dL (ref 12.0–15.0)
MCH: 29.5 pg (ref 26.0–34.0)
MCHC: 32.5 g/dL (ref 30.0–36.0)
MCV: 90.8 fL (ref 80.0–100.0)
Platelets: 159 10*3/uL (ref 150–400)
RBC: 4.47 MIL/uL (ref 3.87–5.11)
RDW: 14 % (ref 11.5–15.5)
WBC: 7.9 10*3/uL (ref 4.0–10.5)
nRBC: 0 % (ref 0.0–0.2)

## 2021-10-19 LAB — COMPREHENSIVE METABOLIC PANEL
ALT: 21 U/L (ref 0–44)
AST: 27 U/L (ref 15–41)
Albumin: 3.3 g/dL — ABNORMAL LOW (ref 3.5–5.0)
Alkaline Phosphatase: 80 U/L (ref 38–126)
Anion gap: 5 (ref 5–15)
BUN: 5 mg/dL — ABNORMAL LOW (ref 8–23)
CO2: 21 mmol/L — ABNORMAL LOW (ref 22–32)
Calcium: 8.8 mg/dL — ABNORMAL LOW (ref 8.9–10.3)
Chloride: 115 mmol/L — ABNORMAL HIGH (ref 98–111)
Creatinine, Ser: 0.57 mg/dL (ref 0.44–1.00)
GFR, Estimated: >60 mL/min (ref >60)
Glucose, Bld: 106 mg/dL — ABNORMAL HIGH (ref 70–99)
Potassium: 3.2 mmol/L — ABNORMAL LOW (ref 3.5–5.1)
Sodium: 141 mmol/L (ref 135–145)
Total Bilirubin: 0.4 mg/dL (ref 0.3–1.2)
Total Protein: 6.7 g/dL (ref 6.5–8.1)

## 2021-10-19 LAB — URINE CULTURE: Culture: <10000 — AB

## 2021-10-19 LAB — POTASSIUM: Potassium: 3.6 mmol/L (ref 3.5–5.1)

## 2021-10-19 MED ORDER — POTASSIUM CHLORIDE CRYS ER 20 MEQ PO TBCR
20.0000 meq | EXTENDED_RELEASE_TABLET | Freq: Once | ORAL | Status: AC
  Administered 2021-10-19: 20 meq via ORAL
  Filled 2021-10-19: qty 1

## 2021-10-19 MED ORDER — FUROSEMIDE 20 MG PO TABS: ORAL_TABLET | ORAL | 0 refills | Status: DC

## 2021-10-19 MED ORDER — AMLODIPINE BESYLATE 5 MG PO TABS
5.0000 mg | ORAL_TABLET | Freq: Every day | ORAL | Status: DC
  Administered 2021-10-19: 5 mg via ORAL
  Filled 2021-10-19: qty 1

## 2021-10-19 MED ORDER — POTASSIUM CHLORIDE CRYS ER 20 MEQ PO TBCR
40.0000 meq | EXTENDED_RELEASE_TABLET | Freq: Once | ORAL | Status: AC
  Administered 2021-10-19: 40 meq via ORAL
  Filled 2021-10-19: qty 2

## 2021-10-19 NOTE — TOC Initial Note (Signed)
Transition of Care [facility]) - Initial/Assessment Note    Patient Details  Name: Katelyn Cooper MRN: [id number] Date of Birth: Nov 25, 1955  Transition of Care [facility] CM/SW Contact:    Beverly Sessions, RN Phone Number: 10/19/2021, 2:06 PM  Clinical Narrative:                 Admitted for: Sirs Admitted from:home with husband PCP: Katelyn Cooper Current home health/prior home health/DME: NA  Patient agreeable to home health services.  States she does not have a preference of agency.  Referral made and accepted by [facility] with [facility].   Referral made to [facility] with [facility] for RW. To be delivered to room prior to discharge   Expected Discharge Plan: [place] Barriers to Discharge: Barriers Resolved, Continued Medical Work up   Patient Goals and CMS Choice        Expected Discharge Plan and Services Expected Discharge Plan: [place]       Living arrangements for the past 2 months: Single Family Home                 DME Arranged: [place] DME Agency: [employer] Date DME Agency Contacted: 10/19/21   Representative spoke with at DME Agency: Katelyn Cooper: PT, OT [place] Agency: [place] ([place]) Date HH Agency Contacted: 10/19/21   Representative spoke with at [place]: Katelyn Cooper  Prior Living Arrangements/Services Living arrangements for the past 2 months: [place] with:: Spouse Patient language and need for interpreter reviewed:: Yes Do you feel safe going back to the place where you live?: Yes      Need for Family Participation in Patient Care: Yes (Comment) Care giver support system in place?: Yes (comment)   Criminal Activity/Legal Involvement Pertinent to Current Situation/Hospitalization: No - Comment as needed  Activities of Daily Living Home Assistive Devices/Equipment: C-collar ADL Screening (condition at time of admission) Patient's cognitive ability adequate to safely complete  daily activities?: Yes Is the patient deaf or have difficulty hearing?: No Does the patient have difficulty seeing, even when wearing glasses/contacts?: No Does the patient have difficulty concentrating, remembering, or making decisions?: No Patient able to express need for assistance with ADLs?: Yes Does the patient have difficulty dressing or bathing?: No Independently performs ADLs?: No Communication: Independent Dressing (OT): Independent Grooming: Independent Feeding: Independent Bathing: Independent Toileting: Needs assistance Is this a change from baseline?: Change from baseline, expected to last <3 days In/Out Bed: Needs assistance Is this a change from baseline?: Change from baseline, expected to last <3 days Walks in Home: Independent with device (comment) Does the patient have difficulty walking or climbing stairs?: Yes Weakness of Legs: Both Weakness of Arms/Hands: None  Permission Sought/Granted                  Emotional Assessment       Orientation: : Oriented to Self, Oriented to Place, Oriented to  Time, Oriented to Situation Alcohol / Substance Use: Not Applicable Psych Involvement: No (comment)  Admission diagnosis:  Hypokalemia [E87.6] Weakness generalized [R53.1] Leg weakness, bilateral [R29.898] Inability to ambulate due to multiple joints [R26.2] Sepsis, due to unspecified organism, unspecified whether acute organ dysfunction present [facility]) [A41.9] Patient Active Problem List   Diagnosis Date Noted   Leg weakness, bilateral 10/18/2021   SIRS (systemic inflammatory response syndrome) ([place]) 10/18/2021   Polycythemia 10/18/2021   Hypokalemia 10/18/2021   Lactic acidosis 10/18/2021   Elevated AST (SGOT)  10/18/2021   PSVT (paroxysmal supraventricular tachycardia) ([place]) 08/10/2020   Pain 05/31/2020   Vasovagal syncope 05/31/2020   Type 2 diabetes mellitus with diabetic peripheral angiopathy without gangrene, without long-term current use of insulin  (Katelyn Cooper) 03/16/2020   Diarrhea 01/27/2020   Angular cheilitis 10/16/2019   Bipolar disorder in remission (Katelyn Cooper) 02/09/2019   Post traumatic stress disorder (PTSD) 02/09/2019   Chronic heart failure with preserved ejection fraction (HFpEF) ([place]) 11/15/2017   Essential hypertension 11/15/2017   Obstructive sleep apnea 11/15/2017   NICM (nonischemic cardiomyopathy) (Katelyn Cooper) 11/13/2017   CAD in native artery 11/13/2017   Menopausal symptom 06/13/2017   Osteopenia 06/13/2017   Uterine leiomyoma 06/13/2017   PAD (peripheral artery disease) (Katelyn Cooper) 09/19/2016   Hyperlipidemia associated with type 2 diabetes mellitus (Katelyn Cooper) 09/19/2016   Coronary artery disease involving native coronary artery of native heart without angina pectoris 09/05/2016   Abnormal stress test 01/12/2016   Pleuritic chest pain 07/28/2015   Duodenitis 01/05/2015   Dermatitis, nummular 01/05/2015   Allergic rhinitis 12/23/2014   Ataxia 12/23/2014   Back ache 12/23/2014   Cervical muscle strain 12/23/2014   Depression 12/23/2014   Eczema 12/23/2014   GERD (gastroesophageal reflux disease) 12/23/2014   History of anemia 12/23/2014   Pure hypercholesterolemia 12/23/2014   Nephropyelitis 12/23/2014   Tremor 12/23/2014   Vitamin D deficiency 11/03/2013   Diabetes mellitus type 2, controlled (Katelyn Cooper) 08/13/2011   Cough 05/10/2011   Chest pain 05/10/2011   Tobacco use 05/10/2011   PCP:  Katelyn Sons, MD Pharmacy:   [facility] [address] [place] [address] Phone: [phone] Fax: [phone]  [id number] [place]Phone: [phone]: [phone]    Social Determinants of Health (SDOH) Interventions    Readmission Risk Interventions     No data to display

## 2021-10-19 NOTE — Consult Note (Signed)
ORTHOPAEDIC CONSULTATION  REQUESTING PHYSICIAN: Nolberto Hanlon, MD  Chief Complaint: Lower extremity weakness in the setting of recent cervical spine fusion  HPI: Katelyn Cooper is a 66 y.o. female with a history of just of congestive heart failure, nonobstructive coronary artery disease, type 2 diabetes, peripheral vascular disease and panic disorder who was admitted around midnight early Wednesday morning after having 2 episodes of bilateral lower extremity weakness.  Patient denies loss of consciousness.  He states that she did not pass out.  Patient states that her legs just gave out and she could not stand.  Her husband helped her avoid falling.  This happened twice at home before EMS was called to bring the patient to [facility].  Patient is a smoker.  Today at the bedside the patient is wearing her cervical collar.  She is alert and oriented.  She denies any pain.  She denies numbness or tingling.  The patient has been able to ambulate with physical therapy.  She denies any urinary retention or loss of bladder or bowel function.  She denies any trouble swallowing or breathing.  Past Medical History:  Diagnosis Date   (HFpEF) heart failure with preserved ejection fraction ([place])    a. 12/2015 Echo: EF 55-60%, nl RV size/fxn, no significant valvular abnormalities; b. 10/2017 Echo: EF 50-55%, antsept, ant HK. Gr2 DD. Mild MR. Nl RV fxn. PASP 57mHg.   Anxiety    Basal cell carcinoma 08/08/2017   L nose above mid nasal alar groove   CHF (congestive heart failure) (HCC)    Depression    Diabetes type 2, controlled ([facility]    diet-controlled   Esophageal stricture    GERD (gastroesophageal reflux disease)    Hiatal hernia    History of chicken pox    Hyperlipidemia    Hypertension    Internal hemorrhoids    Non-obstructive CAD (coronary artery disease)    a. 12/2015 MV: apical ant and apical reversible defect. EF 55-65%; b. 01/2016 Cath: LM nl, LAD nl, SP1 40ost, LCX  nl, OM1 30, RCA 30p/m, EDP 15-256mg; c. 10/2017 Cath: LM nl, LAD nl, SP1 30ost, LCX nl, OM1 30, RCA 30p/m. EF 55%.   Obstructive sleep apnea    PAD (peripheral artery disease) ([facility]   a. 1990's s/p prior LCIA stenting; b. 12/2015 ABI: R 1.05, L 1.03.   Panic disorder    Past Surgical History:  Procedure Laterality Date   AORTA SURGERY  1999   stent placement; ? left iliac artery intervention   APPENDECTOMY  1998   BASAL CELL CARCINOMA EXCISION     CARDIAC CATHETERIZATION N/A 01/12/2016   Procedure: Left Heart Cath and Coronary Angiography;  Surgeon: ChNelva BushMD;  [facility];  Service: Cardiovascular;  Laterality: N/A;   COLONOSCOPY  07/17/2019   FOOT SURGERY     Right   pap smear  03/2010   Done by Dr. AnEverett Graffnormal   REFRACTIVE SURGERY     right   RIB RESECTION  [id number] RIGHT/LEFT HEART CATH AND CORONARY ANGIOGRAPHY N/A 11/08/2017   Procedure: RIGHT/LEFT HEART CATH AND CORONARY ANGIOGRAPHY;  Surgeon: ArWellington HampshireMD;  [facility];  Service: Cardiovascular;  Laterality: N/A;   SHOULDER SURGERY  2005   left   UPPER GASTROINTESTINAL ENDOSCOPY  07/17/2019   UPPER GI ENDOSCOPY  09/19/2011   Stricture at GE junction, dilated. Mild gastritis and duodenitis. Small hiatal hernia   Social History  Socioeconomic History   Marital status: Married    Spouse name: mark   Number of children: 0   Years of education: Not on file   Highest education level: High school graduate  Occupational History   Occupation: computer-retired    Employer: [employer]  Tobacco Use   Smoking status: Every Day    Packs/day: 1.00    Years: 40.00    Total pack years: 40.00    Types: Cigarettes    Start date: 03/01/1975    Last attempt to quit: 08/11/2017    Years since quitting: 4.1   Smokeless tobacco: Never   Tobacco comments:    0.75PPD 10/11/2020  Vaping Use   Vaping Use: Former  Substance and Sexual Activity   Alcohol use: No     Alcohol/week: 0.0 standard drinks of alcohol   Drug use: No   Sexual activity: Not Currently  Other Topics Concern   Not on file  Social History Narrative   Not on file   Social Determinants of Health   Financial Resource Strain: Low Risk  (03/04/2017)   Overall Financial Resource Strain (CARDIA)    Difficulty of Paying Living Expenses: Not hard at all  Food Insecurity: No Food Insecurity (03/04/2017)   Hunger Vital Sign    Worried About Running Out of Food in the Last Year: Never true    Ran Out of Food in the Last Year: Never true  Transportation Needs: No Transportation Needs (03/04/2017)   PRAPARE - Hydrologist (Medical): No    Lack of Transportation (Non-Medical): No  Physical Activity: Inactive (03/04/2017)   Exercise Vital Sign    Days of Exercise per Week: 0 days    Minutes of Exercise per Session: 0 min  Stress: No Stress Concern Present (03/04/2017)   [place]    Feeling of Stress : Not at all  Social Connections: Moderately Isolated (03/04/2017)   Social Connection and Isolation Panel [NHANES]    Frequency of Communication with Friends and Family: Once a week    Frequency of Social Gatherings with Friends and Family: Never    Attends Religious Services: Never    Printmaker: No    Attends Music therapist: Never    Marital Status: Married   Family History  Problem Relation Age of Onset   Lung cancer Mother        was a smoker   Emphysema Mother    Cancer Mother        Lung   Alcohol abuse Mother    Depression Mother    Heart disease Mother    Drug abuse Sister    Breast cancer Sister        x 2  (30&50)   Emphysema Sister    Bipolar disorder Sister    Alcohol abuse Other    Depression Other    Bipolar disorder Other    Heart disease Other    Vision loss Other    Alcohol abuse Father    Mood Disorder Father    Heart  disease Maternal Grandmother    Stroke Maternal Grandmother    Esophageal cancer Maternal Aunt    Colon cancer Neg Hx    Colon polyps Neg Hx    Rectal cancer Neg Hx    Stomach cancer Neg Hx    Allergies  Allergen Reactions   Lovastatin     depression  Paroxetine Hcl     itching   Lamotrigine Rash   Prior to Admission medications   Medication Sig Start Date End Date Taking? Authorizing Provider  aspirin EC 81 MG tablet Take 1 tablet (81 mg total) by mouth daily. 12/12/15  Yes End, Harrell Gave, MD  atorvastatin (LIPITOR) 80 MG tablet Take 1 tablet (80 mg total) by mouth daily. 08/31/21  Yes End, Harrell Gave, MD  b complex vitamins capsule Take 1 capsule by mouth daily.   Yes [provider]  Cholecalciferol (VITAMIN D3) 5000 units TABS Take 1 tablet by mouth daily.  04/09/16  Yes [provider]  docusate sodium (COLACE) 100 MG capsule Take 100 mg by mouth daily as needed. 10/11/21  Yes [provider]  gabapentin (NEURONTIN) 300 MG capsule Take 300 mg by mouth 3 (three) times daily.   Yes [provider]  glucose blood test strip ONETOUCH ULTRA MINI STRIPS AND LANCETS. Use one daily to check blood sugar. 11/18/17  Yes [name], MD  lansoprazole (PREVACID) 15 MG capsule Take 15 mg by mouth daily at 6 (six) AM.   Yes [provider]  metFORMIN (GLUCOPHAGE-XR) 500 MG 24 hr tablet TAKE 1 TABLET BY MOUTH EVERY EVENING . GENERIC EQUIVALENT FOR GLUCOPHAGE-XR 02/14/21  Yes [name], MD  methocarbamol (ROBAXIN) 500 MG tablet Take 500 mg by mouth 2 (two) times daily. 09/18/21  Yes [provider]  methocarbamol (ROBAXIN-750) 750 MG tablet Take 1 tablet (750 mg total) by mouth 4 (four) times daily. 08/14/21  Yes [name], MD  metoprolol tartrate (LOPRESSOR) 25 MG tablet Take 1 tablet (25 mg total) by mouth 2 (two) times daily. 08/31/21  Yes End, Harrell Gave, MD  Multiple Vitamins-Minerals (MULTI-VITAMIN GUMMIES PO) Take by mouth.  Taking 1 daily   Yes [provider]  ondansetron (ZOFRAN-ODT) 4 MG disintegrating tablet 4 mg 2 (two) times daily as needed. 10/11/21  Yes [provider]  oxyCODONE (OXY IR/ROXICODONE) 5 MG immediate release tablet Take 5 mg by mouth every 4 (four) hours as needed. 10/11/21  Yes [provider]  QUEtiapine (SEROQUEL) 100 MG tablet Take 1 tablet (100 mg total) by mouth at bedtime. 09/08/21 12/07/21 Yes Hisada, Elie Goody, MD  traMADol (ULTRAM) 50 MG tablet Take 50 mg by mouth every 6 (six) hours. 10/06/21  Yes [provider]  venlafaxine XR (EFFEXOR-XR) 75 MG 24 hr capsule Take 1 capsule (75 mg total) by mouth daily with breakfast. 10/04/21 01/02/22 Yes Hisada, Elie Goody, MD  zaleplon (SONATA) 10 MG capsule Take 1 capsule (10 mg total) by mouth at bedtime as needed for sleep. 10/01/21 12/30/21 Yes Hisada, Elie Goody, MD  acetaminophen (TYLENOL) 500 MG tablet Take 500 mg by mouth every 6 (six) hours as needed.    [provider]  albuterol (VENTOLIN HFA) 108 (90 Base) MCG/ACT inhaler Ventolin HFA 90 mcg/actuation aerosol inhaler  inhale 2 puffs by mouth every 6 hours if needed for wheezing or shortness of breath    [provider]  furosemide (LASIX) 20 MG tablet TAKE 1 TABLET DAILY PRN FOR INCREASED FLUID OR WEIGHT GAIN 08/31/21   End, Harrell Gave, MD  potassium chloride SA (KLOR-CON M20) 20 MEQ tablet Take 2 tablets (40 meq) by mouth once daily only on days you are taking lasix 07/04/21   End, Harrell Gave, MD   MR LUMBAR SPINE WO CONTRAST  Result Date: 10/18/2021 CLINICAL DATA:  Initial evaluation for bilateral leg weakness, recent cervical fusion. EXAM: MRI LUMBAR SPINE WITHOUT CONTRAST TECHNIQUE:  Multiplanar, multisequence MR imaging of the lumbar spine was performed. No intravenous contrast was administered. COMPARISON:  None available. FINDINGS: Segmentation: Standard. Lowest well-formed disc space labeled the L5-S1 level. Alignment: Trace retrolisthesis of L2 on L3, with  trace anterolisthesis of L3 on L4. Findings likely chronic and facet mediated. Alignment otherwise normal preservation of the normal lumbar lordosis. Vertebrae: Vertebral body height maintained without acute or chronic fracture. Bone marrow signal intensity within normal limits. No discrete or worrisome osseous lesions. No abnormal marrow edema. Conus medullaris and cauda equina: Conus extends to the T12 level. Conus and cauda equina appear normal. Paraspinal and other soft tissues: Paraspinous soft tissues within normal limits. Asymmetric left renal atrophy noted. Disc levels: T12-L1: Left subarticular disc extrusion with inferior migration. No significant canal or lateral recess stenosis. Foramina remain patent. L1-2:  Negative interspace.  Mild facet hypertrophy.  No stenosis. L2-3: Mild disc bulge with disc desiccation. Superimposed right foraminal to extraforaminal disc protrusion closely approximates the exiting right L2 nerve root. Mild right greater than left facet hypertrophy. No significant spinal stenosis. Foramina remain patent. L3-4: Mild disc bulge with disc desiccation. Superimposed right foraminal to extraforaminal disc protrusion closely approximates the exiting right L3 nerve root. Moderate facet hypertrophy. Resultant mild spinal stenosis. Mild right L3 foraminal narrowing. Left neural foramen remains patent. L4-5: Minimal annular disc bulge. Mild facet hypertrophy. No spinal stenosis. Foramina remain patent. L5-S1: Normal interspace. Mild facet hypertrophy. No canal or foraminal stenosis. IMPRESSION: 1. No acute abnormality within the lumbar spine. No evidence for cord compression or high-grade spinal stenosis. 2. Small right foraminal to extraforaminal disc protrusions at L2-3 and L3-4, closely approximating and potentially irritating the exiting right L2 and L3 nerve roots respectively. 3. Left subarticular disc extrusion with inferior migration at T12-L1 without significant stenosis or  impingement. Electronically Signed   By: Jeannine Boga M.D.   On: 10/18/2021 04:51   MR Cervical Spine W or Wo Contrast  Result Date: 10/18/2021 CLINICAL DATA:  Initial evaluation for neck pain with bilateral leg weakness, recent surgery. EXAM: MRI CERVICAL SPINE WITHOUT AND WITH CONTRAST TECHNIQUE: Multiplanar and multiecho pulse sequences of the cervical spine, to include the craniocervical junction and cervicothoracic junction, were obtained without and with intravenous contrast. CONTRAST:  51m GADAVIST GADOBUTROL 1 MMOL/ML IV SOLN COMPARISON:  Prior MRI from 01/15/2014. FINDINGS: Alignment: Straightening of the normal cervical lordosis. Trace anterolisthesis of C4 on C5. Vertebrae: Susceptibility artifact related to prior ACDF at C5-C7. No visible hardware complication by MRI. Vertebral body height maintained without acute or chronic fracture. Bone marrow signal intensity within normal limits. No discrete or worrisome osseous lesions. No abnormal marrow edema or enhancement. No findings of osteomyelitis discitis or septic arthritis. Cord: Normal signal and morphology. No abnormal enhancement. No epidural collections. Posterior Fossa, vertebral arteries, paraspinal tissues: Visualized brain and posterior fossa within normal limits. Craniocervical junction normal. Paraspinous soft tissues within normal limits. Normal flow voids seen within the vertebral arteries bilaterally. Disc levels: C2-C3: Negative interspace. Minimal facet spurring. No canal or foraminal stenosis. C3-C4: Minimal disc bulge with uncovertebral spurring. No spinal stenosis. Foramina remain patent. C4-C5: Trace anterolisthesis. Mild disc bulge with uncovertebral hypertrophy. Moderate right-sided facet arthrosis. No spinal stenosis. Mild to moderate right C5 foraminal narrowing. Left neural foramen remains patent. C5-C6: Prior fusion. No significant residual spinal stenosis. Uncovertebral spurring with residual mild to moderate  bilateral foraminal narrowing. C6-C7: Prior fusion. No residual spinal stenosis. Uncovertebral spurring with residual moderate left with mild right C7 foraminal stenosis.  C7-T1:  Unremarkable. Visualized upper thoracic spine demonstrates no significant finding. IMPRESSION: 1. Postoperative changes from recent ACDF at Q6-P6 without complication. No significant residual spinal stenosis at these levels. Residual mild to moderate bilateral C6 and left C7 foraminal narrowing related to residual uncovertebral disease. 2. Mild disc bulge with uncovertebral hypertrophy at C4-5 with resultant mild to moderate right C5 foraminal stenosis. Electronically Signed   By: Jeannine Boga M.D.   On: 10/18/2021 04:45   MR BRAIN WO CONTRAST  Result Date: 10/18/2021 CLINICAL DATA:  Initial evaluation for altered mental status. Recent cervical fusion. EXAM: MRI HEAD WITHOUT CONTRAST TECHNIQUE: Multiplanar, multiecho pulse sequences of the brain and surrounding structures were obtained without intravenous contrast. COMPARISON:  Prior CT from earlier the same day. FINDINGS: Brain: Cerebral volume within normal limits. Mild scattered patchy T2/FLAIR hyperintensity involving the periventricular and deep white matter both cerebral hemispheres as well as the pons, most characteristic of chronic small vessel ischemic disease, mild to moderate in nature. No evidence for acute or subacute infarct. Gray-white matter differentiation well maintained. No areas of chronic cortical infarction. No acute or chronic intracranial blood products. No mass lesion, midline shift or mass effect. No hydrocephalus or extra-axial fluid collection. Pituitary gland and suprasellar region within normal limits. Vascular: Major intracranial vascular flow voids are maintained. Skull and upper cervical spine: Craniocervical junction within normal limits. Bone marrow signal intensity normal. No scalp soft tissue abnormality. Sinuses/Orbits: Prior ocular lens  replacement on the left. Globes and orbital soft tissues demonstrate no acute finding. Paranasal sinuses are largely clear. No mastoid effusion. Other: None. IMPRESSION: 1. No acute intracranial abnormality. 2. Mild to moderate chronic microvascular ischemic disease for age. Electronically Signed   By: Jeannine Boga M.D.   On: 10/18/2021 04:37   DG Chest Port 1 View  Result Date: 10/18/2021 CLINICAL DATA:  Weakness. EXAM: PORTABLE CHEST 1 VIEW COMPARISON:  Chest radiograph dated 04/30/2019. FINDINGS: Background of emphysema. No focal consolidation, pleural effusion or pneumothorax. The cardiac silhouette is within limits. Atherosclerotic calcification of the aorta. No acute osseous pathology. IMPRESSION: No active cardiopulmonary disease. Electronically Signed   By: Anner Crete M.D.   On: 10/18/2021 01:05   CT Head Wo Contrast  Result Date: 10/18/2021 CLINICAL DATA:  Mental status change, unknown cause EXAM: CT HEAD WITHOUT CONTRAST TECHNIQUE: Contiguous axial images were obtained from the base of the skull through the vertex without intravenous contrast. RADIATION DOSE REDUCTION: This exam was performed according to the departmental dose-optimization program which includes automated exposure control, adjustment of the mA and/or kV according to patient size and/or use of iterative reconstruction technique. COMPARISON:  None Available. FINDINGS: Brain: Normal anatomic configuration. Parenchymal volume loss is commensurate with the patient's age. Mild periventricular white matter changes are present likely reflecting the sequela of small vessel ischemia. No abnormal intra or extra-axial mass lesion or fluid collection. No abnormal mass effect or midline shift. No evidence of acute intracranial hemorrhage or infarct. Ventricular size is normal. Cerebellum unremarkable. Vascular: No asymmetric hyperdense vasculature at the skull base. Skull: Intact Sinuses/Orbits: Paranasal sinuses are clear. Orbits  are unremarkable. Other: Mastoid air cells and middle ear cavities are clear. IMPRESSION: No acute intracranial abnormality. Mild senescent change. Electronically Signed   By: Fidela Salisbury M.D.   On: 10/18/2021 00:56    Positive ROS: All other systems have been reviewed and were otherwise negative with the exception of those mentioned in the HPI and as above.  Physical Exam: General: Alert and oriented, no  acute distress, comfortable and not in pain.   She moves easily in bed.  MUSCULOSKELETAL: Cervical spine: Patient has a rigid collar in place.  Her anterior cervical dressing is clean and dry.  There is no obvious swelling or bruising.  Patient has full motion of all 4 extremities without limitation or pain.  Patient demonstrates no weakness to the lower extremities on manual strength testing today including extension of her great toe, dorsiflexion and plantarflexion of her ankles, flexion extension of her knees.  She has intact sensation light touch throughout her bilateral lower extremities.  She has palpable pedal pulses.  There is no lower extremity edema  Assessment: Bilateral lower extremity weakness, possible vasovagal versus presyncopal episodes  Plan: I reviewed the patient's brain cervical and lumbar spine reports.  There is no significant pathology seen to describe her bilateral lower extremity weakness.  Patient states she has received potassium supplementation and intravenous fluids while she has been in the hospital.  She states she has had one episode of her legs feeling weak while she was in the hospital yesterday.  She states at that time one of the nurses looked at her and told her she appeared like she was going to fall and helped her sit down.  I suspect the patient had the appearance of someone experiencing a vasovagal versus presyncopal episode.  Patient has been making progress with physical therapy and has been able to ambulate.  Her potassium this AM was 3.2.  Hemoglobin  and hematocrit are within normal limits.   Her WBC is not elevated.  Last BP is 166/72.  HR 80.  She is afebrile.  Patient appears to be clinically improving.  She is making progress with physical therapy.  Patient does not demonstrate any detectable weakness on exam today.  Her MRI showed no concerning pathology to explain her lower extremity weakness.  Her symptoms may be presyncopal or vasovagal even though she did not lose consciousness.  IV fluid support seem to help her.    Patient may be discharged from an orthopedic standpoint.  She is being set up for home health physical therapy and a walker has been ordered for her..  I have made the patient an appointment to see Dr. Arbie Cookey in our office at [facility] in [place] tomorrow at 11:30 AM.  She may contact [facility] if she has any issues after discharge before her appointment tomorrow morning.  Patient understood and agreed with this plan.      Thornton Park, MD    10/19/2021 3:27 PM

## 2021-10-19 NOTE — Progress Notes (Signed)
PROGRESS NOTE    Katelyn Cooper  ZMO:[id number] DOB: 1955-06-01 DOA: 10/18/2021 PCP: [name], MD    Brief Narrative:  Katelyn Cooper is a 65 y.o. female with medical history significant of hypertension, hyperlipidemia, diastolic CHF last EF 03-54% with grade 1 DD, nonobstructive CAD, PAD, anxiety, and depression who presents with complaints of weakness in her legs.  She had been having issues with pain on the left shoulder and arm and just underwent cervical fusion with Dr. Kayleen Memos of [facility] a little over a week ago.  Following the surgery patient had not been eating or drinking very much due to lack of appetite and difficulty swallowing.  She also has to wear a cervical collar for at least 6 weeks for which it makes it difficult to eat.  Appetite had improved this week some.  Yesterday evening she was going to the bathroom when her legs "gave out".  Her husband was able to catch her and got her down to the floor.  She did not hit her head or lose consciousness.  She did not feel lightheaded or dizzy prior to this episode.   7/20 feels better today. Walked with PT.     Consultants:  orthopedics  Procedures:   Antimicrobials:      Subjective: No sob, cp, dizziness  Objective: Vitals:   10/18/21 1954 10/18/21 2033 10/19/21 0450 10/19/21 0735  BP: (!) 154/89 (!) 176/75 (!) 157/72 (!) 166/72  Pulse: 83 84 69 80  Resp: '18 16 16   '$ Temp: 98.3 F (36.8 C) 98.4 F (36.9 C) (!) 97.5 F (36.4 C) 98.2 F (36.8 C)  TempSrc: Oral Oral Oral   SpO2: 96% 95% 97% 99%  Weight:      Height:        Intake/Output Summary (Last 24 hours) at 10/19/2021 6568 Last data filed at 10/19/2021 1275 Gross per 24 hour  Intake 1400 ml  Output 400 ml  Net 1000 ml   Filed Weights   10/18/21 0029  Weight: 59.4 kg    Examination: Calm, NAD Neck collar in place Cta no w/r Reg s1/s2 no gallop Soft benign +bs No edema Aaoxox3  Mood and affect appropriate in current setting     Data Reviewed: I have personally reviewed following labs and imaging studies  CBC: Recent Labs  Lab 10/18/21 0117 10/19/21 0406  WBC 12.0* 7.9  NEUTROABS 8.1*  --   HGB 15.6* 13.2  HCT 47.5* 40.6  MCV 90.1 90.8  PLT 228 170   Basic Metabolic Panel: Recent Labs  Lab 10/18/21 0117 10/19/21 0406  NA 139 141  K 3.1* 3.2*  CL 108 115*  CO2 23 21*  GLUCOSE 158* 106*  BUN 11 5*  CREATININE 0.71 0.57  CALCIUM 9.9 8.8*   GFR: Estimated Creatinine Clearance: 56.4 mL/min (by C-G formula based on SCr of 0.57 mg/dL). Liver Function Tests: Recent Labs  Lab 10/18/21 0117 10/19/21 0406  AST 46* 27  ALT 26 21  ALKPHOS 100 80  BILITOT 0.6 0.4  PROT 8.3* 6.7  ALBUMIN 4.1 3.3*   No results for input(s): "LIPASE", "AMYLASE" in the last 168 hours. No results for input(s): "AMMONIA" in the last 168 hours. Coagulation Profile: No results for input(s): "INR", "PROTIME" in the last 168 hours. Cardiac Enzymes: No results for input(s): "CKTOTAL", "CKMB", "CKMBINDEX", "TROPONINI" in the last 168 hours. BNP (last 3 results) No results for input(s): "PROBNP" in the last 8760 hours. HbA1C: No results for input(s): "HGBA1C" in  the last 72 hours. CBG: No results for input(s): "GLUCAP" in the last 168 hours. Lipid Profile: No results for input(s): "CHOL", "HDL", "LDLCALC", "TRIG", "CHOLHDL", "LDLDIRECT" in the last 72 hours. Thyroid Function Tests: Recent Labs    10/18/21 1137  TSH 0.934   Anemia Panel: No results for input(s): "VITAMINB12", "FOLATE", "FERRITIN", "TIBC", "IRON", "RETICCTPCT" in the last 72 hours. Sepsis Labs: Recent Labs  Lab 10/18/21 0117 10/18/21 0344  PROCALCITON <0.10  --   LATICACIDVEN 3.0* 1.9    Recent Results (from the past 240 hour(s))  Culture, blood (routine x 2)     Status: None (Preliminary result)   Collection Time: 10/18/21  1:18 AM   Specimen: BLOOD  Result Value Ref Range Status   Specimen Description BLOOD RIGHT FOREARM  Final    Special Requests   Final    BOTTLES DRAWN AEROBIC AND ANAEROBIC Blood Culture results may not be optimal due to an inadequate volume of blood received in culture bottles   Culture   Final    NO GROWTH < 12 HOURS Performed at [facility], [address], [place], [place] [address]    Report Status PENDING  Incomplete  Culture, blood (routine x 2)     Status: None (Preliminary result)   Collection Time: 10/18/21  1:18 AM   Specimen: BLOOD  Result Value Ref Range Status   Specimen Description BLOOD RIGHT ASSIST CONTROL  Final   Special Requests   Final    BOTTLES DRAWN AEROBIC AND ANAEROBIC Blood Culture adequate volume   Culture   Final    NO GROWTH < 12 HOURS Performed at [facility], [address] [place], [place] [address]    Report Status PENDING  Incomplete  SARS Coronavirus 2 by RT PCR (hospital order, performed in [facility] hospital lab) *cepheid single result test* Anterior Nasal Swab     Status: None   Collection Time: 10/18/21  3:44 AM   Specimen: Anterior Nasal Swab  Result Value Ref Range Status   SARS Coronavirus 2 by RT PCR NEGATIVE NEGATIVE Final    Comment: (NOTE) SARS-CoV-2 target nucleic acids are NOT DETECTED.  The SARS-CoV-2 RNA is generally detectable in upper and lower respiratory specimens during the acute phase of infection. The lowest concentration of SARS-CoV-2 viral copies this assay can detect is 250 copies / mL. A negative result does not preclude SARS-CoV-2 infection and should not be used as the sole basis for treatment or other patient management decisions.  A negative result may occur with improper specimen collection / handling, submission of specimen other than nasopharyngeal swab, presence of viral mutation(s) within the areas targeted by this assay, and inadequate number of viral copies (<250 copies / mL). A negative result must be combined with clinical observations, patient history, and epidemiological  information.  Fact Sheet for Patients:   https://www.patel.info/  Fact Sheet for Healthcare Providers: https://hall.com/  This test is not yet approved or  cleared by the [place] FDA and has been authorized for detection and/or diagnosis of SARS-CoV-2 by FDA under an Emergency Use Authorization (EUA).  This EUA will remain in effect (meaning this test can be used) for the duration of the COVID-19 declaration under Section 564(b)(1) of the Act, 21 U.S.C. section 360bbb-3(b)(1), unless the authorization is terminated or revoked sooner.  Performed at [facility], [address] [place], [place] [address]          Radiology Studies: MR LUMBAR SPINE WO CONTRAST  Result  Date: 10/18/2021 CLINICAL DATA:  Initial evaluation for bilateral leg weakness, recent cervical fusion. EXAM: MRI LUMBAR SPINE WITHOUT CONTRAST TECHNIQUE: Multiplanar, multisequence MR imaging of the lumbar spine was performed. No intravenous contrast was administered. COMPARISON:  None available. FINDINGS: Segmentation: Standard. Lowest well-formed disc space labeled the L5-S1 level. Alignment: Trace retrolisthesis of L2 on L3, with trace anterolisthesis of L3 on L4. Findings likely chronic and facet mediated. Alignment otherwise normal preservation of the normal lumbar lordosis. Vertebrae: Vertebral body height maintained without acute or chronic fracture. Bone marrow signal intensity within normal limits. No discrete or worrisome osseous lesions. No abnormal marrow edema. Conus medullaris and cauda equina: Conus extends to the T12 level. Conus and cauda equina appear normal. Paraspinal and other soft tissues: Paraspinous soft tissues within normal limits. Asymmetric left renal atrophy noted. Disc levels: T12-L1: Left subarticular disc extrusion with inferior migration. No significant canal or lateral recess stenosis. Foramina remain patent. L1-2:  Negative  interspace.  Mild facet hypertrophy.  No stenosis. L2-3: Mild disc bulge with disc desiccation. Superimposed right foraminal to extraforaminal disc protrusion closely approximates the exiting right L2 nerve root. Mild right greater than left facet hypertrophy. No significant spinal stenosis. Foramina remain patent. L3-4: Mild disc bulge with disc desiccation. Superimposed right foraminal to extraforaminal disc protrusion closely approximates the exiting right L3 nerve root. Moderate facet hypertrophy. Resultant mild spinal stenosis. Mild right L3 foraminal narrowing. Left neural foramen remains patent. L4-5: Minimal annular disc bulge. Mild facet hypertrophy. No spinal stenosis. Foramina remain patent. L5-S1: Normal interspace. Mild facet hypertrophy. No canal or foraminal stenosis. IMPRESSION: 1. No acute abnormality within the lumbar spine. No evidence for cord compression or high-grade spinal stenosis. 2. Small right foraminal to extraforaminal disc protrusions at L2-3 and L3-4, closely approximating and potentially irritating the exiting right L2 and L3 nerve roots respectively. 3. Left subarticular disc extrusion with inferior migration at T12-L1 without significant stenosis or impingement. Electronically Signed   By: Jeannine Boga M.D.   On: 10/18/2021 04:51   MR Cervical Spine W or Wo Contrast  Result Date: 10/18/2021 CLINICAL DATA:  Initial evaluation for neck pain with bilateral leg weakness, recent surgery. EXAM: MRI CERVICAL SPINE WITHOUT AND WITH CONTRAST TECHNIQUE: Multiplanar and multiecho pulse sequences of the cervical spine, to include the craniocervical junction and cervicothoracic junction, were obtained without and with intravenous contrast. CONTRAST:  41m GADAVIST GADOBUTROL 1 MMOL/ML IV SOLN COMPARISON:  Prior MRI from 01/15/2014. FINDINGS: Alignment: Straightening of the normal cervical lordosis. Trace anterolisthesis of C4 on C5. Vertebrae: Susceptibility artifact related to prior  ACDF at C5-C7. No visible hardware complication by MRI. Vertebral body height maintained without acute or chronic fracture. Bone marrow signal intensity within normal limits. No discrete or worrisome osseous lesions. No abnormal marrow edema or enhancement. No findings of osteomyelitis discitis or septic arthritis. Cord: Normal signal and morphology. No abnormal enhancement. No epidural collections. Posterior Fossa, vertebral arteries, paraspinal tissues: Visualized brain and posterior fossa within normal limits. Craniocervical junction normal. Paraspinous soft tissues within normal limits. Normal flow voids seen within the vertebral arteries bilaterally. Disc levels: C2-C3: Negative interspace. Minimal facet spurring. No canal or foraminal stenosis. C3-C4: Minimal disc bulge with uncovertebral spurring. No spinal stenosis. Foramina remain patent. C4-C5: Trace anterolisthesis. Mild disc bulge with uncovertebral hypertrophy. Moderate right-sided facet arthrosis. No spinal stenosis. Mild to moderate right C5 foraminal narrowing. Left neural foramen remains patent. C5-C6: Prior fusion. No significant residual spinal stenosis. Uncovertebral spurring with residual mild to moderate bilateral foraminal  narrowing. C6-C7: Prior fusion. No residual spinal stenosis. Uncovertebral spurring with residual moderate left with mild right C7 foraminal stenosis. C7-T1:  Unremarkable. Visualized upper thoracic spine demonstrates no significant finding. IMPRESSION: 1. Postoperative changes from recent ACDF at G6-Y6 without complication. No significant residual spinal stenosis at these levels. Residual mild to moderate bilateral C6 and left C7 foraminal narrowing related to residual uncovertebral disease. 2. Mild disc bulge with uncovertebral hypertrophy at C4-5 with resultant mild to moderate right C5 foraminal stenosis. Electronically Signed   By: Jeannine Boga M.D.   On: 10/18/2021 04:45   MR BRAIN WO CONTRAST  Result Date:  10/18/2021 CLINICAL DATA:  Initial evaluation for altered mental status. Recent cervical fusion. EXAM: MRI HEAD WITHOUT CONTRAST TECHNIQUE: Multiplanar, multiecho pulse sequences of the brain and surrounding structures were obtained without intravenous contrast. COMPARISON:  Prior CT from earlier the same day. FINDINGS: Brain: Cerebral volume within normal limits. Mild scattered patchy T2/FLAIR hyperintensity involving the periventricular and deep white matter both cerebral hemispheres as well as the pons, most characteristic of chronic small vessel ischemic disease, mild to moderate in nature. No evidence for acute or subacute infarct. Gray-white matter differentiation well maintained. No areas of chronic cortical infarction. No acute or chronic intracranial blood products. No mass lesion, midline shift or mass effect. No hydrocephalus or extra-axial fluid collection. Pituitary gland and suprasellar region within normal limits. Vascular: Major intracranial vascular flow voids are maintained. Skull and upper cervical spine: Craniocervical junction within normal limits. Bone marrow signal intensity normal. No scalp soft tissue abnormality. Sinuses/Orbits: Prior ocular lens replacement on the left. Globes and orbital soft tissues demonstrate no acute finding. Paranasal sinuses are largely clear. No mastoid effusion. Other: None. IMPRESSION: 1. No acute intracranial abnormality. 2. Mild to moderate chronic microvascular ischemic disease for age. Electronically Signed   By: Jeannine Boga M.D.   On: 10/18/2021 04:37   DG Chest Port 1 View  Result Date: 10/18/2021 CLINICAL DATA:  Weakness. EXAM: PORTABLE CHEST 1 VIEW COMPARISON:  Chest radiograph dated 04/30/2019. FINDINGS: Background of emphysema. No focal consolidation, pleural effusion or pneumothorax. The cardiac silhouette is within limits. Atherosclerotic calcification of the aorta. No acute osseous pathology. IMPRESSION: No active cardiopulmonary  disease. Electronically Signed   By: Anner Crete M.D.   On: 10/18/2021 01:05   CT Head Wo Contrast  Result Date: 10/18/2021 CLINICAL DATA:  Mental status change, unknown cause EXAM: CT HEAD WITHOUT CONTRAST TECHNIQUE: Contiguous axial images were obtained from the base of the skull through the vertex without intravenous contrast. RADIATION DOSE REDUCTION: This exam was performed according to the departmental dose-optimization program which includes automated exposure control, adjustment of the mA and/or kV according to patient size and/or use of iterative reconstruction technique. COMPARISON:  None Available. FINDINGS: Brain: Normal anatomic configuration. Parenchymal volume loss is commensurate with the patient's age. Mild periventricular white matter changes are present likely reflecting the sequela of small vessel ischemia. No abnormal intra or extra-axial mass lesion or fluid collection. No abnormal mass effect or midline shift. No evidence of acute intracranial hemorrhage or infarct. Ventricular size is normal. Cerebellum unremarkable. Vascular: No asymmetric hyperdense vasculature at the skull base. Skull: Intact Sinuses/Orbits: Paranasal sinuses are clear. Orbits are unremarkable. Other: Mastoid air cells and middle ear cavities are clear. IMPRESSION: No acute intracranial abnormality. Mild senescent change. Electronically Signed   By: Fidela Salisbury M.D.   On: 10/18/2021 00:56        Scheduled Meds:  aspirin EC  81 mg  Oral Daily   enoxaparin (LOVENOX) injection  40 mg Subcutaneous Q24H   gabapentin  300 mg Oral TID   metoprolol tartrate  25 mg Oral BID   nicotine  14 mg Transdermal Daily   pantoprazole  20 mg Oral Daily   potassium chloride  20 mEq Oral Once   potassium chloride  40 mEq Oral Once   QUEtiapine  100 mg Oral QHS   saccharomyces boulardii  250 mg Oral BID   sodium chloride flush  3 mL Intravenous Q12H   venlafaxine XR  75 mg Oral Q breakfast   Continuous Infusions:   sodium chloride 75 mL/hr at 10/19/21 1610   ceFEPime (MAXIPIME) IV Stopped (10/18/21 2356)   vancomycin Stopped (10/18/21 2301)    Assessment & Plan:   Principal Problem:   SIRS (systemic inflammatory response syndrome) (HCC) Active Problems:   Lactic acidosis   Leg weakness, bilateral   Polycythemia   Hypokalemia   Essential hypertension   Elevated AST (SGOT)   Diabetes mellitus type 2, controlled ([place])   Hyperlipidemia associated with type 2 diabetes mellitus ([place])   Depression   Tobacco use   SIRS lactic acidosis Sepsis ruled out Bcx pending Continue empiric antibiotics Ortho consulted, pending-Dr. Mack Guise made aware.   Bilateral leg weakness Acute.  Patient presents with lower extremity leg weakness.  MRIs of the head, cervical spine, and lumbar spine without clear acute abnormality to cause patient's symptoms.  Question if related to patient's poor p.o. intake. 7/20 ortho consulted PT rec. HH   Recent cervical fusion Patient underwent cervical fusion by Dr. Kayleen Memos on [facility] on 7/11. 7/20 ortho was consulted    Polycythemia Likely due to hemoconcentration Has improved with IV fluids   Hypokalemia We will replace as K is 3.2   Essential hypertension Holding lasix On beta blk Has room for improvement add amlodipine '5mg'$  qd   Elevated AST Acute.  AST elevated at 46.   Patient denies any significant history of alcohol use.  Possibly reactive in nature. 7/20 resolved, and now nml with ivf   Controlled diabetes mellitus type 2 Last hemoglobin A1c 6.6 . Home medication regimen includes metformin XR 500 mg. -Hold metformin 7/20 add riss Ck fs    Hyperlipidemia -Continue atorvastatin   Depression   -Continue Seroquel and Zoloft   GERD Home medication regimen includes Prevacid 15 mg daily -Continue pharmacy substitution of Protonix 20 mg daily   Tobacco use Patient reports that she still continues to smoke cigarettes. -Nicotine patch  offered   DVT prophylaxis: lovenox Code Status:full Family Communication: husband at bedside Disposition Plan: back home Status is: Observation The patient remains OBS appropriate and will d/c before 2 midnights.        LOS: 1 day   Time spent: 47 min    Nolberto Hanlon, MD [facility] Pager [phone]  If 7PM-7AM, please contact night-coverage 10/19/2021, 8:12 AM

## 2021-10-19 NOTE — Evaluation (Signed)
Physical Therapy Evaluation Patient Details Name: Katelyn Cooper MRN: [id number] DOB: 28-Sep-1955 Today's Date: 10/19/2021  History of Present Illness  Pt is a 66 y.o. female with medical history significant of hypertension, hyperlipidemia, diastolic CHF last EF 51-02% with grade 1 DD, nonobstructive CAD, PAD, anxiety, depression, cervical fusion 10/10/21 who presents with complaints of weakness in her legs and instance of legs giving out. Per MD  Work-up including chest x-ray, urinalysis, and MRIs did not give clear source of patient's symptoms, MD assessment includes SIRS, lactic acidosis, polycythemia, hypokalemia  Clinical Impression  Pt was pleasant and motivated to participate during the session and put forth good effort throughout. Pt is able to complete supine<>sit  and sit<>stands with little to no difficulty. Pt is able to ambulate using RW and no AD with slow steady gait but does have a varying narrow BOS and unable to correct despite verbal cuing. Does have some occasional swaying but no LOB or knee buckling noted. Pt reports unsure if current walking pattern is baseline. No apparent strength deficits or coordination observed when tested in BLE. Pt will benefit from HHPT upon discharge to safely address deficits listed in patient problem list for decreased caregiver assistance and eventual return to PLOF.       Recommendations for follow up therapy are one component of a multi-disciplinary discharge planning process, led by the attending physician.  Recommendations may be updated based on patient status, additional functional criteria and insurance authorization.  Follow Up Recommendations Home health PT      Assistance Recommended at Discharge Intermittent Supervision/Assistance  Patient can return home with the following  A little help with walking and/or transfers;A little help with bathing/dressing/bathroom;Assistance with cooking/housework;Assist for transportation;Help with  stairs or ramp for entrance    Equipment Recommendations Rolling walker (2 wheels)  Recommendations for Other Services       Functional Status Assessment Patient has had a recent decline in their functional status and demonstrates the ability to make significant improvements in function in a reasonable and predictable amount of time.     Precautions / Restrictions Precautions Precautions: Cervical Required Braces or Orthoses: Cervical Brace Cervical Brace: Hard collar;At all times Restrictions Weight Bearing Restrictions: No      Mobility  Bed Mobility Overal bed mobility: Modified Independent Bed Mobility: Supine to Sit                Transfers Overall transfer level: Needs assistance Equipment used: Rolling walker (2 wheels), None Transfers: Sit to/from Stand Sit to Stand: Supervision                Ambulation/Gait Ambulation/Gait assistance: Min guard Gait Distance (Feet): 80 Feet w/ RW; 80 Feet no AD Assistive device: Rolling walker (2 wheels), None Gait Pattern/deviations: Step-through pattern, Decreased step length - right, Decreased step length - left, Narrow base of support Gait velocity: decreased     General Gait Details: slow steady gait with both RW and no AD. has a very narrow BOS as if she is tandem walking. some minor sways noted but no LOB. Additionally no knee buckling noted per prior episode.   Stairs            Wheelchair Mobility    Modified Rankin (Stroke Patients Only)       Balance Overall balance assessment: Needs assistance Sitting-balance support: Feet supported Sitting balance-Leahy Scale: Good     Standing balance support: Bilateral upper extremity supported, Single extremity supported, During functional activity Standing balance-Leahy Scale: Fair  In comparison to baseline, pt reports balance does feel impaired and has some difficulty during gait and functional mobility.                                 Pertinent Vitals/Pain Pain Assessment Pain Assessment: No/denies pain    Home Living Family/patient expects to be discharged to:: Private residence Living Arrangements: Spouse/significant other (husband) Available Help at Discharge: Family;Available PRN/intermittently (husband works during the day) Type of Home: House Home Access: Stairs to enter Entrance Stairs-Rails: None Technical brewer of Steps: 2 in garage, 3 in front entrance   Home Layout: One level Home Equipment: None      Prior Function Prior Level of Function : Independent/Modified Independent             Mobility Comments: Ind ambulator using no AD, no hx of falls ADLs Comments: Ind w/ ADLs     Hand Dominance        Extremity/Trunk Assessment   Upper Extremity Assessment Upper Extremity Assessment: Overall WFL for tasks assessed    Lower Extremity Assessment Lower Extremity Assessment: Overall WFL for tasks assessed       Communication   Communication: No difficulties  Cognition Arousal/Alertness: Awake/alert Behavior During Therapy: WFL for tasks assessed/performed Overall Cognitive Status: Within Functional Limits for tasks assessed                                          General Comments      Exercises     Assessment/Plan    PT Assessment Patient needs continued PT services  PT Problem List Decreased strength;Decreased mobility;Decreased activity tolerance;Decreased balance;Decreased knowledge of use of DME       PT Treatment Interventions Therapeutic exercise;DME instruction;Gait training;Balance training;Stair training;Functional mobility training;Therapeutic activities;Patient/family education    PT Goals (Current goals can be found in the Care Plan section)  Acute Rehab PT Goals Patient Stated Goal: Pt would like to get back to her normal activites and routine PT Goal Formulation: With patient Time For Goal Achievement: 11/01/21 Potential to  Achieve Goals: Good    Frequency Min 2X/week     Co-evaluation               AM-PAC PT "6 Clicks" Mobility  Outcome Measure Help needed turning from your back to your side while in a flat bed without using bedrails?: None Help needed moving from lying on your back to sitting on the side of a flat bed without using bedrails?: None Help needed moving to and from a bed to a chair (including a wheelchair)?: None Help needed standing up from a chair using your arms (e.g., wheelchair or bedside chair)?: None Help needed to walk in hospital room?: A Little Help needed climbing 3-5 steps with a railing? : A Little 6 Click Score: 22    End of Session Equipment Utilized During Treatment: Gait belt Activity Tolerance: Patient tolerated treatment well Patient left: in chair;with call bell/phone within reach;with family/visitor present Nurse Communication: Mobility status (RN notified of chair alarm having no batteries) PT Visit Diagnosis: Unsteadiness on feet (R26.81);Difficulty in walking, not elsewhere classified (R26.2);Repeated falls (R29.6)    Time: 2774-1287 PT Time Calculation (min) (ACUTE ONLY): 33 min   Charges:             Turner Daniels, SPT  10/19/2021,  9:34 AM

## 2021-10-19 NOTE — Discharge Summary (Signed)
Katelyn Cooper BTD:[id number] DOB: 05-30-1955 DOA: 10/18/2021  PCP: [name], MD  Admit date: 10/18/2021 Discharge date7/20/2023  Admitted From: home Disposition:  home  Recommendations for Outpatient Follow-up:  Follow up with PCP in 1 week Please obtain BMP/CBC in one week Follow-up with orthopedics as     Discharge Condition:Stable CODE STATUS:full  Diet recommendation: Heart Healthy / Carb Modified   Brief/Interim Summary: PER XMI:WOEHO Katelyn Cooper is a 66 y.o. female with medical history significant of hypertension, hyperlipidemia, diastolic CHF last EF 12-24% with grade 1 DD, nonobstructive CAD, PAD, anxiety, and depression who presents with complaints of weakness in her legs.  She had been having issues with pain on the left shoulder and arm and just underwent cervical fusion with Dr. Kayleen Memos of [facility] a little over a week ago.  Following the surgery patient had not been eating or drinking very much due to lack of appetite and difficulty swallowing.  She also has to wear a cervical collar for at least 6 weeks for which it makes it difficult to eat.  Appetite had improved this week some.  Previous evening she was going to the bathroom when her legs "gave out".  Her husband was able to catch her and got her down to the floor.  She did not hit her head or lose consciousness.  She did not feel lightheaded or dizzy prior to this episode.  Ortho was consulted and cleared her for discharge to home to follow-up with Dr. Derrek Gu the next day.    SIRS lactic acidosis Sepsis ruled out Blood cultures negative She was treated empirically with IV antibiotics Orthopedics cleared her   Bilateral leg weakness Acute.  Patient presents with lower extremity leg weakness.  MRIs of the head, cervical spine, and lumbar spine without clear acute abnormality to cause patient's symptoms.  Question if related to patient's poor p.o. intake. PT recommended home health She did not  improve with IV fluids Ortho cleared her   Recent cervical fusion Patient underwent cervical fusion by Dr. Kayleen Memos on [facility] on 7/11. ortho was consulted     Polycythemia Likely due to hemoconcentration Has improved with IV fluids   Hypokalemia Replaced   Essential hypertension Her Lasix was held Continue home meds as below   Elevated AST Resolved with IV fluids   Controlled diabetes mellitus type 2 Last hemoglobin A1c 6.6 .  Continue home meds   Hyperlipidemia -Continue statins   Depression   -Continue home meds   GERD Continue ppi   Tobacco use Patient reports that she still continues to smoke cigarettes. -counseled about smoking cessation during this hospitalization       Discharge Diagnoses:  Principal Problem:   SIRS (systemic inflammatory response syndrome) (HCC) Active Problems:   Lactic acidosis   Leg weakness, bilateral   Polycythemia   Hypokalemia   Essential hypertension   Elevated AST (SGOT)   Diabetes mellitus type 2, controlled ([place])   Hyperlipidemia associated with type 2 diabetes mellitus ([place])   Depression   Tobacco use    Discharge Instructions  Discharge Instructions     Diet - low sodium heart healthy   Complete by: As directed    Discharge instructions   Complete by: As directed    Follow up  with Dr. Arbie Cookey at [facility] in [place] tomorrow at 11:30 AM   Increase activity slowly   Complete by: As directed    No wound care   Complete by: As directed  Allergies as of 10/19/2021       Reactions   Lovastatin    depression   Paroxetine Hcl    itching   Lamotrigine Rash        Medication List     TAKE these medications    acetaminophen 500 MG tablet Commonly known as: TYLENOL Take 500 mg by mouth every 6 (six) hours as needed.   albuterol 108 (90 Base) MCG/ACT inhaler Commonly known as: VENTOLIN HFA Ventolin HFA 90 mcg/actuation aerosol inhaler  inhale 2 puffs by mouth every 6 hours if  needed for wheezing or shortness of breath   aspirin EC 81 MG tablet Take 1 tablet (81 mg total) by mouth daily.   atorvastatin 80 MG tablet Commonly known as: LIPITOR Take 1 tablet (80 mg total) by mouth daily.   b complex vitamins capsule Take 1 capsule by mouth daily.   docusate sodium 100 MG capsule Commonly known as: COLACE Take 100 mg by mouth daily as needed.   furosemide 20 MG tablet Commonly known as: LASIX TAKE 1 TABLET DAILY PRN FOR INCREASED FLUID OR WEIGHT GAIN   gabapentin 300 MG capsule Commonly known as: NEURONTIN Take 300 mg by mouth 3 (three) times daily.   glucose blood test strip ONETOUCH ULTRA MINI STRIPS AND LANCETS. Use one daily to check blood sugar.   lansoprazole 15 MG capsule Commonly known as: PREVACID Take 15 mg by mouth daily at 6 (six) AM.   metFORMIN 500 MG 24 hr tablet Commonly known as: GLUCOPHAGE-XR TAKE 1 TABLET BY MOUTH EVERY EVENING . GENERIC EQUIVALENT FOR GLUCOPHAGE-XR   methocarbamol 500 MG tablet Commonly known as: ROBAXIN Take 500 mg by mouth 2 (two) times daily. What changed: Another medication with the same name was removed. Continue taking this medication, and follow the directions you see here.   metoprolol tartrate 25 MG tablet Commonly known as: LOPRESSOR Take 1 tablet (25 mg total) by mouth 2 (two) times daily.   MULTI-VITAMIN GUMMIES PO Take by mouth. Taking 1 daily   ondansetron 4 MG disintegrating tablet Commonly known as: ZOFRAN-ODT 4 mg 2 (two) times daily as needed.   oxyCODONE 5 MG immediate release tablet Commonly known as: Oxy IR/ROXICODONE Take 5 mg by mouth every 4 (four) hours as needed.   potassium chloride SA 20 MEQ tablet Commonly known as: Klor-Con M20 Take 2 tablets (40 meq) by mouth once daily only on days you are taking lasix   QUEtiapine 100 MG tablet Commonly known as: SEROQUEL Take 1 tablet (100 mg total) by mouth at bedtime.   traMADol 50 MG tablet Commonly known as: ULTRAM Take  50 mg by mouth every 6 (six) hours.   venlafaxine XR 75 MG 24 hr capsule Commonly known as: EFFEXOR-XR Take 1 capsule (75 mg total) by mouth daily with breakfast.   Vitamin D3 125 MCG (5000 UT) Tabs Take 1 tablet by mouth daily.   zaleplon 10 MG capsule Commonly known as: Sonata Take 1 capsule (10 mg total) by mouth at bedtime as needed for sleep.        Follow-up Information     [name], MD Follow up in 1 week(s).   Specialty: Family Medicine Contact information: [address] [place] [address] [phone]                Allergies  Allergen Reactions   Lovastatin     depression   Paroxetine Hcl     itching   Lamotrigine  Rash    Consultations: orthopedics   Procedures/Studies: MR LUMBAR SPINE WO CONTRAST  Result Date: 10/18/2021 CLINICAL DATA:  Initial evaluation for bilateral leg weakness, recent cervical fusion. EXAM: MRI LUMBAR SPINE WITHOUT CONTRAST TECHNIQUE: Multiplanar, multisequence MR imaging of the lumbar spine was performed. No intravenous contrast was administered. COMPARISON:  None available. FINDINGS: Segmentation: Standard. Lowest well-formed disc space labeled the L5-S1 level. Alignment: Trace retrolisthesis of L2 on L3, with trace anterolisthesis of L3 on L4. Findings likely chronic and facet mediated. Alignment otherwise normal preservation of the normal lumbar lordosis. Vertebrae: Vertebral body height maintained without acute or chronic fracture. Bone marrow signal intensity within normal limits. No discrete or worrisome osseous lesions. No abnormal marrow edema. Conus medullaris and cauda equina: Conus extends to the T12 level. Conus and cauda equina appear normal. Paraspinal and other soft tissues: Paraspinous soft tissues within normal limits. Asymmetric left renal atrophy noted. Disc levels: T12-L1: Left subarticular disc extrusion with inferior migration. No significant canal or lateral recess stenosis. Foramina  remain patent. L1-2:  Negative interspace.  Mild facet hypertrophy.  No stenosis. L2-3: Mild disc bulge with disc desiccation. Superimposed right foraminal to extraforaminal disc protrusion closely approximates the exiting right L2 nerve root. Mild right greater than left facet hypertrophy. No significant spinal stenosis. Foramina remain patent. L3-4: Mild disc bulge with disc desiccation. Superimposed right foraminal to extraforaminal disc protrusion closely approximates the exiting right L3 nerve root. Moderate facet hypertrophy. Resultant mild spinal stenosis. Mild right L3 foraminal narrowing. Left neural foramen remains patent. L4-5: Minimal annular disc bulge. Mild facet hypertrophy. No spinal stenosis. Foramina remain patent. L5-S1: Normal interspace. Mild facet hypertrophy. No canal or foraminal stenosis. IMPRESSION: 1. No acute abnormality within the lumbar spine. No evidence for cord compression or high-grade spinal stenosis. 2. Small right foraminal to extraforaminal disc protrusions at L2-3 and L3-4, closely approximating and potentially irritating the exiting right L2 and L3 nerve roots respectively. 3. Left subarticular disc extrusion with inferior migration at T12-L1 without significant stenosis or impingement. Electronically Signed   By: Jeannine Boga M.D.   On: 10/18/2021 04:51   MR Cervical Spine W or Wo Contrast  Result Date: 10/18/2021 CLINICAL DATA:  Initial evaluation for neck pain with bilateral leg weakness, recent surgery. EXAM: MRI CERVICAL SPINE WITHOUT AND WITH CONTRAST TECHNIQUE: Multiplanar and multiecho pulse sequences of the cervical spine, to include the craniocervical junction and cervicothoracic junction, were obtained without and with intravenous contrast. CONTRAST:  65m GADAVIST GADOBUTROL 1 MMOL/ML IV SOLN COMPARISON:  Prior MRI from 01/15/2014. FINDINGS: Alignment: Straightening of the normal cervical lordosis. Trace anterolisthesis of C4 on C5. Vertebrae:  Susceptibility artifact related to prior ACDF at C5-C7. No visible hardware complication by MRI. Vertebral body height maintained without acute or chronic fracture. Bone marrow signal intensity within normal limits. No discrete or worrisome osseous lesions. No abnormal marrow edema or enhancement. No findings of osteomyelitis discitis or septic arthritis. Cord: Normal signal and morphology. No abnormal enhancement. No epidural collections. Posterior Fossa, vertebral arteries, paraspinal tissues: Visualized brain and posterior fossa within normal limits. Craniocervical junction normal. Paraspinous soft tissues within normal limits. Normal flow voids seen within the vertebral arteries bilaterally. Disc levels: C2-C3: Negative interspace. Minimal facet spurring. No canal or foraminal stenosis. C3-C4: Minimal disc bulge with uncovertebral spurring. No spinal stenosis. Foramina remain patent. C4-C5: Trace anterolisthesis. Mild disc bulge with uncovertebral hypertrophy. Moderate right-sided facet arthrosis. No spinal stenosis. Mild to moderate right C5 foraminal narrowing. Left neural foramen remains patent. C5-C6:  Prior fusion. No significant residual spinal stenosis. Uncovertebral spurring with residual mild to moderate bilateral foraminal narrowing. C6-C7: Prior fusion. No residual spinal stenosis. Uncovertebral spurring with residual moderate left with mild right C7 foraminal stenosis. C7-T1:  Unremarkable. Visualized upper thoracic spine demonstrates no significant finding. IMPRESSION: 1. Postoperative changes from recent ACDF at T0-W4 without complication. No significant residual spinal stenosis at these levels. Residual mild to moderate bilateral C6 and left C7 foraminal narrowing related to residual uncovertebral disease. 2. Mild disc bulge with uncovertebral hypertrophy at C4-5 with resultant mild to moderate right C5 foraminal stenosis. Electronically Signed   By: Jeannine Boga M.D.   On: 10/18/2021 04:45    MR BRAIN WO CONTRAST  Result Date: 10/18/2021 CLINICAL DATA:  Initial evaluation for altered mental status. Recent cervical fusion. EXAM: MRI HEAD WITHOUT CONTRAST TECHNIQUE: Multiplanar, multiecho pulse sequences of the brain and surrounding structures were obtained without intravenous contrast. COMPARISON:  Prior CT from earlier the same day. FINDINGS: Brain: Cerebral volume within normal limits. Mild scattered patchy T2/FLAIR hyperintensity involving the periventricular and deep white matter both cerebral hemispheres as well as the pons, most characteristic of chronic small vessel ischemic disease, mild to moderate in nature. No evidence for acute or subacute infarct. Gray-white matter differentiation well maintained. No areas of chronic cortical infarction. No acute or chronic intracranial blood products. No mass lesion, midline shift or mass effect. No hydrocephalus or extra-axial fluid collection. Pituitary gland and suprasellar region within normal limits. Vascular: Major intracranial vascular flow voids are maintained. Skull and upper cervical spine: Craniocervical junction within normal limits. Bone marrow signal intensity normal. No scalp soft tissue abnormality. Sinuses/Orbits: Prior ocular lens replacement on the left. Globes and orbital soft tissues demonstrate no acute finding. Paranasal sinuses are largely clear. No mastoid effusion. Other: None. IMPRESSION: 1. No acute intracranial abnormality. 2. Mild to moderate chronic microvascular ischemic disease for age. Electronically Signed   By: Jeannine Boga M.D.   On: 10/18/2021 04:37   DG Chest Port 1 View  Result Date: 10/18/2021 CLINICAL DATA:  Weakness. EXAM: PORTABLE CHEST 1 VIEW COMPARISON:  Chest radiograph dated 04/30/2019. FINDINGS: Background of emphysema. No focal consolidation, pleural effusion or pneumothorax. The cardiac silhouette is within limits. Atherosclerotic calcification of the aorta. No acute osseous pathology.  IMPRESSION: No active cardiopulmonary disease. Electronically Signed   By: Anner Crete M.D.   On: 10/18/2021 01:05   CT Head Wo Contrast  Result Date: 10/18/2021 CLINICAL DATA:  Mental status change, unknown cause EXAM: CT HEAD WITHOUT CONTRAST TECHNIQUE: Contiguous axial images were obtained from the base of the skull through the vertex without intravenous contrast. RADIATION DOSE REDUCTION: This exam was performed according to the departmental dose-optimization program which includes automated exposure control, adjustment of the mA and/or kV according to patient size and/or use of iterative reconstruction technique. COMPARISON:  None Available. FINDINGS: Brain: Normal anatomic configuration. Parenchymal volume loss is commensurate with the patient's age. Mild periventricular white matter changes are present likely reflecting the sequela of small vessel ischemia. No abnormal intra or extra-axial mass lesion or fluid collection. No abnormal mass effect or midline shift. No evidence of acute intracranial hemorrhage or infarct. Ventricular size is normal. Cerebellum unremarkable. Vascular: No asymmetric hyperdense vasculature at the skull base. Skull: Intact Sinuses/Orbits: Paranasal sinuses are clear. Orbits are unremarkable. Other: Mastoid air cells and middle ear cavities are clear. IMPRESSION: No acute intracranial abnormality. Mild senescent change. Electronically Signed   By: Fidela Salisbury M.D.   On: 10/18/2021  00:56      Subjective: Feels better, ready to go home  Discharge Exam: Vitals:   10/19/21 0735 10/19/21 1545  BP: (!) 166/72 (!) 153/63  Pulse: 80 81  Resp: 16 20  Temp: 98.2 F (36.8 C) 98.5 F (36.9 C)  SpO2: 99% 97%   Vitals:   10/18/21 2033 10/19/21 0450 10/19/21 0735 10/19/21 1545  BP: (!) 176/75 (!) 157/72 (!) 166/72 (!) 153/63  Pulse: 84 69 80 81  Resp: '16 16 16 20  '$ Temp: 98.4 F (36.9 C) (!) 97.5 F (36.4 C) 98.2 F (36.8 C) 98.5 F (36.9 C)  TempSrc: Oral  Oral Oral   SpO2: 95% 97% 99% 97%  Weight:      Height:        General: Pt is alert, awake, not in acute distress Cardiovascular: RRR, S1/S2 +, no rubs, no gallops Respiratory: CTA bilaterally, no wheezing, no rhonchi Abdominal: Soft, NT, ND, bowel sounds + Extremities: no edema, no cyanosis    The results of significant diagnostics from this hospitalization (including imaging, microbiology, ancillary and laboratory) are listed below for reference.     Microbiology: Recent Results (from the past 240 hour(s))  Urine Culture     Status: Abnormal   Collection Time: 10/18/21  1:17 AM   Specimen: Urine, Random  Result Value Ref Range Status   Specimen Description   Final    URINE, RANDOM Performed at [facility], [address] [place], [place] [address]    Special Requests   Final    NONE Performed at [facility], [address] [place], [place] [address]    Culture (A)  Final    <10,000 COLONIES/mL INSIGNIFICANT GROWTH Performed at [facility] [address] [place], [place] [address]    Report Status 10/19/2021 FINAL  Final  Culture, blood (routine x 2)     Status: None (Preliminary result)   Collection Time: 10/18/21  1:18 AM   Specimen: BLOOD  Result Value Ref Range Status   Specimen Description BLOOD RIGHT FOREARM  Final   Special Requests   Final    BOTTLES DRAWN AEROBIC AND ANAEROBIC Blood Culture results may not be optimal due to an inadequate volume of blood received in culture bottles   Culture   Final    NO GROWTH 4 DAYS Performed at [facility], [address], [place], [place] [address]    Report Status PENDING  Incomplete  Culture, blood (routine x 2)     Status: None (Preliminary result)   Collection Time: 10/18/21  1:18 AM   Specimen: BLOOD  Result Value Ref Range Status   Specimen Description BLOOD RIGHT ASSIST CONTROL  Final   Special Requests   Final    BOTTLES DRAWN AEROBIC AND ANAEROBIC Blood Culture  adequate volume   Culture   Final    NO GROWTH 4 DAYS Performed at [facility], [address] [place],  [address]    Report Status PENDING  Incomplete  SARS Coronavirus 2 by RT PCR (hospital order, performed in [place] hospital lab) *cepheid single result test* Anterior Nasal Swab     Status: None   Collection Time: 10/18/21  3:44 AM   Specimen: Anterior Nasal Swab  Result Value Ref Range Status   SARS Coronavirus 2 by RT PCR NEGATIVE NEGATIVE Final    Comment: (NOTE) SARS-CoV-2 target nucleic acids are NOT DETECTED.  The SARS-CoV-2 RNA is generally detectable in upper and lower respiratory  specimens during the acute phase of infection. The lowest concentration of SARS-CoV-2 viral copies this assay can detect is 250 copies / mL. A negative result does not preclude SARS-CoV-2 infection and should not be used as the sole basis for treatment or other patient management decisions.  A negative result may occur with improper specimen collection / handling, submission of specimen other than nasopharyngeal swab, presence of viral mutation(s) within the areas targeted by this assay, and inadequate number of viral copies (<250 copies / mL). A negative result must be combined with clinical observations, patient history, and epidemiological information.  Fact Sheet for Patients:   https://www.patel.info/  Fact Sheet for Healthcare Providers: https://hall.com/  This test is not yet approved or  cleared by the [place] FDA and has been authorized for detection and/or diagnosis of SARS-CoV-2 by FDA under an Emergency Use Authorization (EUA).  This EUA will remain in effect (meaning this test can be used) for the duration of the COVID-19 declaration under Section 564(b)(1) of the Act, 21 U.S.C. section 360bbb-3(b)(1), unless the authorization is terminated or revoked sooner.  Performed at [facility], [address] [place], [place] [address]      Labs: BNP (last 3 results) No results for input(s): "BNP" in the last 8760 hours. Basic Metabolic Panel: Recent Labs  Lab 10/18/21 0117 10/19/21 0406 10/19/21 1559  NA 139 141  --   K 3.1* 3.2* 3.6  CL 108 115*  --   CO2 23 21*  --   GLUCOSE 158* 106*  --   BUN 11 5*  --   CREATININE 0.71 0.57  --   CALCIUM 9.9 8.8*  --    Liver Function Tests: Recent Labs  Lab 10/18/21 0117 10/19/21 0406  AST 46* 27  ALT 26 21  ALKPHOS 100 80  BILITOT 0.6 0.4  PROT 8.3* 6.7  ALBUMIN 4.1 3.3*   No results for input(s): "LIPASE", "AMYLASE" in the last 168 hours. No results for input(s): "AMMONIA" in the last 168 hours. CBC: Recent Labs  Lab 10/18/21 0117 10/19/21 0406  WBC 12.0* 7.9  NEUTROABS 8.1*  --   HGB 15.6* 13.2  HCT 47.5* 40.6  MCV 90.1 90.8  PLT 228 159   Cardiac Enzymes: No results for input(s): "CKTOTAL", "CKMB", "CKMBINDEX", "TROPONINI" in the last 168 hours. BNP: Invalid input(s): "POCBNP" CBG: No results for input(s): "GLUCAP" in the last 168 hours. D-Dimer No results for input(s): "DDIMER" in the last 72 hours. Hgb A1c No results for input(s): "HGBA1C" in the last 72 hours. Lipid Profile No results for input(s): "CHOL", "HDL", "LDLCALC", "TRIG", "CHOLHDL", "LDLDIRECT" in the last 72 hours. Thyroid function studies No results for input(s): "TSH", "T4TOTAL", "T3FREE", "THYROIDAB" in the last 72 hours.  Invalid input(s): "FREET3"  Anemia work up No results for input(s): "VITAMINB12", "FOLATE", "FERRITIN", "TIBC", "IRON", "RETICCTPCT" in the last 72 hours. Urinalysis    Component Value Date/Time   COLORURINE YELLOW (A) 10/18/2021 0117   APPEARANCEUR HAZY (A) 10/18/2021 0117   LABSPEC 1.023 10/18/2021 0117   PHURINE 5.0 10/18/2021 0117   GLUCOSEU NEGATIVE 10/18/2021 0117   HGBUR NEGATIVE 10/18/2021 0117   BILIRUBINUR NEGATIVE 10/18/2021 0117   BILIRUBINUR small 06/07/2021 1700   KETONESUR NEGATIVE  10/18/2021 0117   PROTEINUR 30 (A) 10/18/2021 0117   UROBILINOGEN negative (A) 06/07/2021 1700   NITRITE NEGATIVE 10/18/2021 0117   LEUKOCYTESUR NEGATIVE 10/18/2021 0117   Sepsis Labs Recent Labs  Lab 10/18/21 0117 10/19/21 0406  WBC 12.0* 7.9  Microbiology Recent Results (from the past 240 hour(s))  Urine Culture     Status: Abnormal   Collection Time: 10/18/21  1:17 AM   Specimen: Urine, Random  Result Value Ref Range Status   Specimen Description   Final    URINE, RANDOM Performed at [facility], [address] [place], [place] [address]    Special Requests   Final    NONE Performed at [facility], [address], [place], [place] [address]    Culture (A)  Final    <10,000 COLONIES/mL INSIGNIFICANT GROWTH Performed at [facility] [address], [place], [place] [address]    Report Status 10/19/2021 FINAL  Final  Culture, blood (routine x 2)     Status: None (Preliminary result)   Collection Time: 10/18/21  1:18 AM   Specimen: BLOOD  Result Value Ref Range Status   Specimen Description BLOOD RIGHT FOREARM  Final   Special Requests   Final    BOTTLES DRAWN AEROBIC AND ANAEROBIC Blood Culture results may not be optimal due to an inadequate volume of blood received in culture bottles   Culture   Final    NO GROWTH 4 DAYS Performed at [facility], [address], [place], [place] [address]    Report Status PENDING  Incomplete  Culture, blood (routine x 2)     Status: None (Preliminary result)   Collection Time: 10/18/21  1:18 AM   Specimen: BLOOD  Result Value Ref Range Status   Specimen Description BLOOD RIGHT ASSIST CONTROL  Final   Special Requests   Final    BOTTLES DRAWN AEROBIC AND ANAEROBIC Blood Culture adequate volume   Culture   Final    NO GROWTH 4 DAYS Performed at [facility], [address], [place],  [address]    Report Status PENDING  Incomplete  SARS Coronavirus 2 by RT PCR (hospital  order, performed in [facility] hospital lab) *cepheid single result test* Anterior Nasal Swab     Status: None   Collection Time: 10/18/21  3:44 AM   Specimen: Anterior Nasal Swab  Result Value Ref Range Status   SARS Coronavirus 2 by RT PCR NEGATIVE NEGATIVE Final    Comment: (NOTE) SARS-CoV-2 target nucleic acids are NOT DETECTED.  The SARS-CoV-2 RNA is generally detectable in upper and lower respiratory specimens during the acute phase of infection. The lowest concentration of SARS-CoV-2 viral copies this assay can detect is 250 copies / mL. A negative result does not preclude SARS-CoV-2 infection and should not be used as the sole basis for treatment or other patient management decisions.  A negative result may occur with improper specimen collection / handling, submission of specimen other than nasopharyngeal swab, presence of viral mutation(s) within the areas targeted by this assay, and inadequate number of viral copies (<250 copies / mL). A negative result must be combined with clinical observations, patient history, and epidemiological information.  Fact Sheet for Patients:   https://www.patel.info/  Fact Sheet for Healthcare Providers: https://hall.com/  This test is not yet approved or  cleared by the [place] FDA and has been authorized for detection and/or diagnosis of SARS-CoV-2 by FDA under an Emergency Use Authorization (EUA).  This EUA will remain in effect (meaning this test can be used) for the duration of the COVID-19 declaration under Section 564(b)(1) of the Act, 21 U.S.C. section 360bbb-3(b)(1), unless the authorization is terminated or revoked sooner.  Performed at [facility] [place]  [address] [place], [place] [address]      Time coordinating discharge: Over 30 minutes  SIGNED:   Nolberto Hanlon, MD  [facility] 10/22/2021, 4:37 PM Pager   If 7PM-7AM, please contact  night-coverage [email or web address] Password [email or web address]

## 2021-10-20 ENCOUNTER — Telehealth: Payer: Self-pay | Admitting: Family Medicine

## 2021-10-20 DIAGNOSIS — M5412 Radiculopathy, cervical region: Secondary | ICD-10-CM | POA: Diagnosis not present

## 2021-10-20 NOTE — Telephone Encounter (Signed)
Tiffany calling from [place] is calling to report that a referral was received on this patient. Eval for PT will not be able to be completed until 10/22/21  Cb- [phone] option 2 only call back if not ok

## 2021-10-23 ENCOUNTER — Ambulatory Visit: Payer: Self-pay

## 2021-10-23 ENCOUNTER — Telehealth: Payer: Self-pay | Admitting: Family Medicine

## 2021-10-23 DIAGNOSIS — E1151 Type 2 diabetes mellitus with diabetic peripheral angiopathy without gangrene: Secondary | ICD-10-CM | POA: Diagnosis not present

## 2021-10-23 DIAGNOSIS — K219 Gastro-esophageal reflux disease without esophagitis: Secondary | ICD-10-CM | POA: Diagnosis not present

## 2021-10-23 DIAGNOSIS — Z9181 History of falling: Secondary | ICD-10-CM | POA: Diagnosis not present

## 2021-10-23 DIAGNOSIS — I2510 Atherosclerotic heart disease of native coronary artery without angina pectoris: Secondary | ICD-10-CM | POA: Diagnosis not present

## 2021-10-23 DIAGNOSIS — F419 Anxiety disorder, unspecified: Secondary | ICD-10-CM | POA: Diagnosis not present

## 2021-10-23 DIAGNOSIS — F17210 Nicotine dependence, cigarettes, uncomplicated: Secondary | ICD-10-CM | POA: Diagnosis not present

## 2021-10-23 DIAGNOSIS — Z4789 Encounter for other orthopedic aftercare: Secondary | ICD-10-CM | POA: Diagnosis not present

## 2021-10-23 DIAGNOSIS — E1169 Type 2 diabetes mellitus with other specified complication: Secondary | ICD-10-CM | POA: Diagnosis not present

## 2021-10-23 DIAGNOSIS — Z7982 Long term (current) use of aspirin: Secondary | ICD-10-CM | POA: Diagnosis not present

## 2021-10-23 DIAGNOSIS — I5030 Unspecified diastolic (congestive) heart failure: Secondary | ICD-10-CM | POA: Diagnosis not present

## 2021-10-23 DIAGNOSIS — Z981 Arthrodesis status: Secondary | ICD-10-CM | POA: Diagnosis not present

## 2021-10-23 DIAGNOSIS — F32A Depression, unspecified: Secondary | ICD-10-CM | POA: Diagnosis not present

## 2021-10-23 DIAGNOSIS — Z7984 Long term (current) use of oral hypoglycemic drugs: Secondary | ICD-10-CM | POA: Diagnosis not present

## 2021-10-23 DIAGNOSIS — E785 Hyperlipidemia, unspecified: Secondary | ICD-10-CM | POA: Diagnosis not present

## 2021-10-23 DIAGNOSIS — I110 Hypertensive heart disease with heart failure: Secondary | ICD-10-CM | POA: Diagnosis not present

## 2021-10-23 DIAGNOSIS — F410 Panic disorder [episodic paroxysmal anxiety] without agoraphobia: Secondary | ICD-10-CM | POA: Diagnosis not present

## 2021-10-23 LAB — CULTURE, BLOOD (ROUTINE X 2)
Culture: NO GROWTH
Culture: NO GROWTH
Special Requests: ADEQUATE

## 2021-10-23 NOTE — Telephone Encounter (Signed)
When can this patient be seen for a hospital f/U on a Friday?    Copied from [place] [phone]. Topic: Appointment Scheduling - Scheduling Inquiry for Clinic >> Oct 23, 2021  1:40 PM Leilani Able wrote: Reason for CRM: Pt needs a HFU as admitted on 7/19 [facility]/ Pt states her husband was out of work so much it must be a Friday HFU do not see an appt in time frame for HFU, spoke with Arbie Cookey and stated to send a CRM to office  call pt [phone]

## 2021-10-23 NOTE — Telephone Encounter (Signed)
Glory Buff called to inform [name] that the patient is taking her Lasix every other day despite the recommendation from the [facility] who recommended the Lasix daily as needed for the weight gain.  Glory Buff advised and will pass along to the patient that she needs to contact the office and scheduled a hospital follow up and to have her medications can be reconciled

## 2021-10-23 NOTE — Telephone Encounter (Signed)
Home Health Verbal Orders - Caller/Agency: Glory Buff from [facility]   Callback Number:  [phone] Requesting PT Frequency:  2x3 1x3

## 2021-10-23 NOTE — Telephone Encounter (Signed)
Summary: foresemide and potassium med discussion   Glory Buff from [facility] called about patient states taking foresemide and potassium every other day and not daily. The list they have from the office shows different, so she just wanted it be noted of the difference. She doesn't request a cal back unless necessary.      Called Jatana LMOM to return call.

## 2021-10-23 NOTE — Telephone Encounter (Signed)
Summary: foresemide and potassium med discussion   Glory Buff from [facility] called about patient states taking foresemide and potassium every other day and not daily. The list they have from the office shows different, so she just wanted it be noted of the difference. She doesn't request a cal back unless necessary.     Per MAR: Lasix is ordered: 1 tablet (20 mg) PRN for increased fluid or weight gain   Potassium:  Take 2 tablets (40 mEq) by mouth once daily only on days Pt. Takes Lasix.  LM on VM to call back to give that information

## 2021-10-23 NOTE — Telephone Encounter (Addendum)
Summary: foresemide and potassium med discussion   Glory Buff from [facility] called about patient states taking foresemide and potassium every other day and not daily. The list they have from the office shows different, so she just wanted it be noted of the difference. She doesn't request a cal back unless necessary.     3rd attempt  made to reach [place]. If she calls back refer to 1st note written by this Probation officer. Routed to [facility].

## 2021-10-24 NOTE — Telephone Encounter (Signed)
Appt made for 10/27/21 at 11:00 a.m. with Letitia Libra.

## 2021-10-25 DIAGNOSIS — Z981 Arthrodesis status: Secondary | ICD-10-CM | POA: Diagnosis not present

## 2021-10-25 DIAGNOSIS — F17210 Nicotine dependence, cigarettes, uncomplicated: Secondary | ICD-10-CM | POA: Diagnosis not present

## 2021-10-25 DIAGNOSIS — Z4789 Encounter for other orthopedic aftercare: Secondary | ICD-10-CM | POA: Diagnosis not present

## 2021-10-25 DIAGNOSIS — E785 Hyperlipidemia, unspecified: Secondary | ICD-10-CM | POA: Diagnosis not present

## 2021-10-25 DIAGNOSIS — Z9181 History of falling: Secondary | ICD-10-CM | POA: Diagnosis not present

## 2021-10-25 DIAGNOSIS — E1151 Type 2 diabetes mellitus with diabetic peripheral angiopathy without gangrene: Secondary | ICD-10-CM | POA: Diagnosis not present

## 2021-10-25 DIAGNOSIS — Z7982 Long term (current) use of aspirin: Secondary | ICD-10-CM | POA: Diagnosis not present

## 2021-10-25 DIAGNOSIS — I2510 Atherosclerotic heart disease of native coronary artery without angina pectoris: Secondary | ICD-10-CM | POA: Diagnosis not present

## 2021-10-25 DIAGNOSIS — F410 Panic disorder [episodic paroxysmal anxiety] without agoraphobia: Secondary | ICD-10-CM | POA: Diagnosis not present

## 2021-10-25 DIAGNOSIS — I5030 Unspecified diastolic (congestive) heart failure: Secondary | ICD-10-CM | POA: Diagnosis not present

## 2021-10-25 DIAGNOSIS — F419 Anxiety disorder, unspecified: Secondary | ICD-10-CM | POA: Diagnosis not present

## 2021-10-25 DIAGNOSIS — I110 Hypertensive heart disease with heart failure: Secondary | ICD-10-CM | POA: Diagnosis not present

## 2021-10-25 DIAGNOSIS — F32A Depression, unspecified: Secondary | ICD-10-CM | POA: Diagnosis not present

## 2021-10-25 DIAGNOSIS — K219 Gastro-esophageal reflux disease without esophagitis: Secondary | ICD-10-CM | POA: Diagnosis not present

## 2021-10-25 DIAGNOSIS — E1169 Type 2 diabetes mellitus with other specified complication: Secondary | ICD-10-CM | POA: Diagnosis not present

## 2021-10-25 DIAGNOSIS — Z7984 Long term (current) use of oral hypoglycemic drugs: Secondary | ICD-10-CM | POA: Diagnosis not present

## 2021-10-27 ENCOUNTER — Ambulatory Visit (INDEPENDENT_AMBULATORY_CARE_PROVIDER_SITE_OTHER): Payer: BC Managed Care – PPO | Admitting: Physician Assistant

## 2021-10-27 ENCOUNTER — Encounter: Payer: Self-pay | Admitting: Physician Assistant

## 2021-10-27 VITALS — BP 117/61 | HR 84 | Temp 98.4°F | Resp 16 | Wt 139.1 lb

## 2021-10-27 DIAGNOSIS — I5032 Chronic diastolic (congestive) heart failure: Secondary | ICD-10-CM | POA: Diagnosis not present

## 2021-10-27 DIAGNOSIS — I739 Peripheral vascular disease, unspecified: Secondary | ICD-10-CM

## 2021-10-27 DIAGNOSIS — E785 Hyperlipidemia, unspecified: Secondary | ICD-10-CM

## 2021-10-27 DIAGNOSIS — I2510 Atherosclerotic heart disease of native coronary artery without angina pectoris: Secondary | ICD-10-CM | POA: Diagnosis not present

## 2021-10-27 DIAGNOSIS — I10 Essential (primary) hypertension: Secondary | ICD-10-CM | POA: Diagnosis not present

## 2021-10-27 DIAGNOSIS — R29898 Other symptoms and signs involving the musculoskeletal system: Secondary | ICD-10-CM

## 2021-10-27 DIAGNOSIS — E119 Type 2 diabetes mellitus without complications: Secondary | ICD-10-CM

## 2021-10-27 NOTE — Progress Notes (Unsigned)
I,Jana Emelly Wurtz,acting as a Education administrator for [employer], PA-C.,have documented all relevant documentation on the behalf of Mardene Speak, PA-C,as directed by  [employer], PA-C while in the presence of [employer], PA-C.   Established patient visit   Patient: Katelyn Cooper   DOB: May 02, 1955   66 y.o. Female  MRN: [id number] Visit Date: 10/27/2021  Today's healthcare provider: Mardene Speak, PA-C   No chief complaint on file.  Subjective    Follow up Hospitalization  Patient was admitted to [facility] on 10/18/21 and discharged on 10/19/21. She was treated for  SIRS (systemic inflammatory response syndrome) (HCC. Treatment for this included: Sepsis ruled out Blood cultures negative She was treated empirically with IV antibiotics Obtain BMP/CBC in one week Telephone follow up was done on 10/23/21 She reports good compliance with treatment. She reports this condition is stayed the same. Patient to have PT for leg weakness.  MRIs of the head, cervical spine, and lumbar spine without clear acute abnormality to cause patient's symptoms.  Question if related to patient's poor p.o. intake. [place] . Also has appt with cardiologist.  ----------------------------------------------------------------------------------------- -  Last Cardiology visit was on 09/01/21 CAD nonobstructive, Chronic HFpEF, PAD, HLD, [place] abuse was stable and she was advised to be seen at clinic in 1 year. Saw Dr. Arbie Cookey [facility] and will FU with him in 68mo She had cervical fusion surgery with Dr. CArbie Cookeyin July of 2021/10/10 [facility] Has been taking the same medications for long-term   Medications: Outpatient Medications Prior to Visit  Medication Sig   acetaminophen (TYLENOL) 500 MG tablet Take 500 mg by mouth every 6 (six) hours as needed.   albuterol (VENTOLIN HFA) 108 (90 Base) MCG/ACT inhaler Ventolin HFA 90 mcg/actuation aerosol inhaler  inhale 2 puffs by mouth every 6  hours if needed for wheezing or shortness of breath   aspirin EC 81 MG tablet Take 1 tablet (81 mg total) by mouth daily.   atorvastatin (LIPITOR) 80 MG tablet Take 1 tablet (80 mg total) by mouth daily.   b complex vitamins capsule Take 1 capsule by mouth daily.   Cholecalciferol (VITAMIN D3) 5000 units TABS Take 1 tablet by mouth daily.    furosemide (LASIX) 20 MG tablet TAKE 1 TABLET DAILY PRN FOR INCREASED FLUID OR WEIGHT GAIN   glucose blood test strip ONETOUCH ULTRA MINI STRIPS AND LANCETS. Use one daily to check blood sugar.   lansoprazole (PREVACID) 15 MG capsule Take 15 mg by mouth daily at 6 (six) AM.   metFORMIN (GLUCOPHAGE-XR) 500 MG 24 hr tablet TAKE 1 TABLET BY MOUTH EVERY EVENING . GENERIC EQUIVALENT FOR GLUCOPHAGE-XR   metoprolol tartrate (LOPRESSOR) 25 MG tablet Take 1 tablet (25 mg total) by mouth 2 (two) times daily.   Multiple Vitamins-Minerals (MULTI-VITAMIN GUMMIES PO) Take by mouth. Taking 1 daily   potassium chloride SA (KLOR-CON M20) 20 MEQ tablet Take 2 tablets (40 meq) by mouth once daily only on days you are taking lasix   QUEtiapine (SEROQUEL) 100 MG tablet Take 1 tablet (100 mg total) by mouth at bedtime.   venlafaxine XR (EFFEXOR-XR) 75 MG 24 hr capsule Take 1 capsule (75 mg total) by mouth daily with breakfast.   zaleplon (SONATA) 10 MG capsule Take 1 capsule (10 mg total) by mouth at bedtime as needed for sleep.   docusate sodium (COLACE) 100 MG capsule Take 100 mg by mouth daily as needed. (Patient not taking:  Reported on 10/27/2021)   gabapentin (NEURONTIN) 300 MG capsule Take 300 mg by mouth 3 (three) times daily. (Patient not taking: Reported on 10/27/2021)   methocarbamol (ROBAXIN) 500 MG tablet Take 500 mg by mouth 2 (two) times daily. (Patient not taking: Reported on 10/27/2021)   ondansetron (ZOFRAN-ODT) 4 MG disintegrating tablet 4 mg 2 (two) times daily as needed. (Patient not taking: Reported on 10/27/2021)   oxyCODONE (OXY IR/ROXICODONE) 5 MG immediate  release tablet Take 5 mg by mouth every 4 (four) hours as needed. (Patient not taking: Reported on 10/27/2021)   traMADol (ULTRAM) 50 MG tablet Take 50 mg by mouth every 6 (six) hours. (Patient not taking: Reported on 10/27/2021)   No facility-administered medications prior to visit.    Review of Systems  Respiratory: Negative.    Cardiovascular: Negative.   Gastrointestinal: Negative.     {Labs  Heme  Chem  Endocrine  Serology  Results Review (optional):23779}   Objective    BP 117/61 (BP Location: Right Arm, Patient Position: Sitting, Cuff Size: Normal)   Pulse 84   Temp 98.4 F (36.9 C) (Oral)   Resp 16   Wt 139 lb 1.6 oz (63.1 kg)   SpO2 97%   BMI 24.64 kg/m  {Show previous vital signs (optional):23777}  Physical Exam Vitals reviewed.  Constitutional:      General: She is not in acute distress.    Appearance: Normal appearance. She is well-developed. She is not diaphoretic.  HENT:     Head: Normocephalic and atraumatic.  Eyes:     General: No scleral icterus.    Conjunctiva/sclera: Conjunctivae normal.  Neck:     Thyroid: No thyromegaly.  Cardiovascular:     Rate and Rhythm: Normal rate and regular rhythm.     Pulses: Normal pulses.     Heart sounds: Normal heart sounds. No murmur heard. Pulmonary:     Effort: Pulmonary effort is normal. No respiratory distress.     Breath sounds: Normal breath sounds. No wheezing, rhonchi or rales.  Musculoskeletal:     Cervical back: Neck supple.     Right lower leg: No edema.     Left lower leg: No edema.  Lymphadenopathy:     Cervical: No cervical adenopathy.  Skin:    General: Skin is warm and dry.     Findings: No rash.  Neurological:     Mental Status: She is alert and oriented to person, place, and time. Mental status is at baseline.  Psychiatric:        Mood and Affect: Mood normal.        Behavior: Behavior normal.        Thought Content: Thought content normal.        Judgment: Judgment normal.     Monofilament testing were WNL  No results found for any visits on 10/27/21.  Assessment & Plan     ***  No follow-ups on file.      {provider attestation***:1}   Mardene Speak, Hershal Coria  [facility] [phone] (phone) [phone] (fax)  [place]

## 2021-10-29 NOTE — Progress Notes (Unsigned)
Cardiology Office Note    Date:  10/31/2021   ID:  Katelyn Cooper, DOB [birth year], MRN [id number]  PCP:  [name], MD  Cardiologist:  Nelva Bush, MD  Electrophysiologist:  None   Chief Complaint: Hospital follow-up  History of Present Illness:   Katelyn Cooper is a 66 y.o. female with history of nonobstructive CAD, PAD status post left iliac stent, HFpEF, vasovagal syncope, DM2, thoracic outlet syndrome status post bilateral rib resections in 4627, HLD, GERD complicated by esophageal stricture, and anxiety/depression who presents for hospital follow-up as outlined below.  LHC from 01/2016 demonstrated 40% stenosis in the proximal aspect of a large first septal perforator along with 30% OM1 stenosis and 30% proximal and mid RCA stenoses with an LVEDP of 15 to 20 mmHg.  R/LHC in 10/2017 showed 30% stenosis at the first septal perforator, OM1 30% stenosis, sequential 30% proximal and mid RCA stenoses, upper normal filling pressures and normal pulmonary artery pressure with a moderately reduced cardiac output as outlined below.  Lexiscan MPI from 03/2019 was low risk without evidence of ischemia or scar coronary artery calcification noted.  LVEF greater than 65%.  Echo from 04/2019 showed an EF of 60 to 03%, grade 1 diastolic dysfunction with elevated filling pressures, normal RV size and systolic function, no significant valvular abnormality, and normal CVP.  She was evaluated for syncope in 05/2020 that occurred after stubbing her toe with associated severe pain.  Outpatient cardiac monitor in 05/2020 showed a predominant rhythm of sinus with 5 runs of PSVT with the longest episode lasting 18.8 seconds as well as rare PACs and PVCs.  Episode of syncope was felt to be precipitated by vasovagal episode complicated by orthostatic hypotension in the setting of dehydration.  She was most recently seen in the office on 09/01/2021, at which time she was without symptoms of angina or  decompensation.  She was taking furosemide every other day, though did take it on a daily basis if there was some dietary indiscretion.  She was most bothered by pain in the left side of her neck and shoulder, and was being treated for a pinched nerve by orthopedics.  She was admitted to the hospital from 7/19 through 7/20 with lower extremity weakness with noted decreased oral intake and dysphagia following a cervical fusion in the context of cervical collar.  High-sensitivity troponin negative x2.  Blood cultures negative.  CBC consistent with hemoconcentration.  She was evaluated by orthopedics and PT with recommendation for home health PT.  She comes in today accompanied by her husband.  She reports several episodes of profound lower extremity weakness leading up to her hospital admission.  She was unable to move her bilateral lower extremities for a brief timeframe in this setting.  She was without symptoms of presyncope, syncope, angina, dyspnea, palpitations, or dizziness during these episodes.  She does note some positional dizziness, though this was not notable during her episodes of lower extremity weakness.  She has not had any further episodes since late last week.  Currently asymptomatic from a cardiac perspective.   Labs independently reviewed: 09/2021 - potassium 3.6, BUN 5, serum creatinine 0.57, albumin 3.3, AST/ALT normal, Hgb 13.2, PLT 159, TSH normal 04/2021 - TC 142, TG 172, HDL 38, LDL 75, A1c 6.6   Past Medical History:  Diagnosis Date   (HFpEF) heart failure with preserved ejection fraction ([place])    a. 12/2015 Echo: EF 55-60%, nl RV size/fxn, no significant valvular abnormalities; b. 10/2017  Echo: EF 50-55%, antsept, ant HK. Gr2 DD. Mild MR. Nl RV fxn. PASP 70mHg.   Anxiety    Basal cell carcinoma 08/08/2017   L nose above mid nasal alar groove   CHF (congestive heart failure) (HCC)    Depression    Diabetes type 2, controlled ([facility]    diet-controlled   Esophageal  stricture    GERD (gastroesophageal reflux disease)    Hiatal hernia    History of chicken pox    Hyperlipidemia    Hypertension    Internal hemorrhoids    Non-obstructive CAD (coronary artery disease)    a. 12/2015 MV: apical ant and apical reversible defect. EF 55-65%; b. 01/2016 Cath: LM nl, LAD nl, SP1 40ost, LCX nl, OM1 30, RCA 30p/m, EDP 15-291mg; c. 10/2017 Cath: LM nl, LAD nl, SP1 30ost, LCX nl, OM1 30, RCA 30p/m. EF 55%.   Obstructive sleep apnea    PAD (peripheral artery disease) ([place]   a. 1990's s/p prior LCIA stenting; b. 12/2015 ABI: R 1.05, L 1.03.   Panic disorder     Past Surgical History:  Procedure Laterality Date   AORTA SURGERY  1999   stent placement; ? left iliac artery intervention   APPENDECTOMY  1998   BASAL CELL CARCINOMA EXCISION     CARDIAC CATHETERIZATION N/A 01/12/2016   Procedure: Left Heart Cath and Coronary Angiography;  Surgeon: ChNelva BushMD;  [facility];  Service: Cardiovascular;  Laterality: N/A;   COLONOSCOPY  07/17/2019   FOOT SURGERY     Right   pap smear  03/2010   Done by Dr. AnEverett Graffnormal   REFRACTIVE SURGERY     right   RIB RESECTION  [id number] RIGHT/LEFT HEART CATH AND CORONARY ANGIOGRAPHY N/A 11/08/2017   Procedure: RIGHT/LEFT HEART CATH AND CORONARY ANGIOGRAPHY;  Surgeon: ArWellington HampshireMD;  Location[facility];  Service: Cardiovascular;  Laterality: N/A;   SHOULDER SURGERY  2005   left   UPPER GASTROINTESTINAL ENDOSCOPY  07/17/2019   UPPER GI ENDOSCOPY  09/19/2011   Stricture at GE junction, dilated. Mild gastritis and duodenitis. Small hiatal hernia    Current Medications: Current Meds  Medication Sig   acetaminophen (TYLENOL) 500 MG tablet Take 500 mg by mouth every 6 (six) hours as needed.   albuterol (VENTOLIN HFA) 108 (90 Base) MCG/ACT inhaler Ventolin HFA 90 mcg/actuation aerosol inhaler  inhale 2 puffs by mouth every 6 hours if needed for wheezing or shortness of breath    aspirin EC 81 MG tablet Take 1 tablet (81 mg total) by mouth daily.   atorvastatin (LIPITOR) 80 MG tablet Take 1 tablet (80 mg total) by mouth daily.   b complex vitamins capsule Take 1 capsule by mouth daily.   Cholecalciferol (VITAMIN D3) 5000 units TABS Take 1 tablet by mouth daily.    docusate sodium (COLACE) 100 MG capsule Take 100 mg by mouth daily as needed.   furosemide (LASIX) 20 MG tablet TAKE 1 TABLET DAILY PRN FOR INCREASED FLUID OR WEIGHT GAIN   glucose blood test strip ONETOUCH ULTRA MINI STRIPS AND LANCETS. Use one daily to check blood sugar.   lansoprazole (PREVACID) 15 MG capsule Take 15 mg by mouth daily at 6 (six) AM.   metFORMIN (GLUCOPHAGE-XR) 500 MG 24 hr tablet TAKE 1 TABLET BY MOUTH EVERY EVENING . GENERIC EQUIVALENT FOR GLUCOPHAGE-XR   metoprolol tartrate (LOPRESSOR) 25 MG tablet Take 1 tablet (25 mg total) by mouth 2 (  two) times daily.   Multiple Vitamins-Minerals (MULTI-VITAMIN GUMMIES PO) Take by mouth. Taking 1 daily   potassium chloride SA (KLOR-CON M20) 20 MEQ tablet Take 2 tablets (40 meq) by mouth once daily only on days you are taking lasix   QUEtiapine (SEROQUEL) 100 MG tablet Take 1 tablet (100 mg total) by mouth at bedtime.   venlafaxine XR (EFFEXOR-XR) 75 MG 24 hr capsule Take 1 capsule (75 mg total) by mouth daily with breakfast.   zaleplon (SONATA) 10 MG capsule Take 1 capsule (10 mg total) by mouth at bedtime as needed for sleep.    Allergies:   Lovastatin, Paroxetine hcl, and Lamotrigine   Social History   Socioeconomic History   Marital status: Married    Spouse name: mark   Number of children: 0   Years of education: Not on file   Highest education level: High school graduate  Occupational History   Occupation: computer-retired    Employer: [employer]  Tobacco Use   Smoking status: Every Day    Packs/day: 1.00    Years: 40.00    Total pack years: 40.00    Types: Cigarettes    Start date: 03/01/1975    Last attempt to quit: 08/11/2017     Years since quitting: 4.2   Smokeless tobacco: Never   Tobacco comments:    0.75PPD 10/11/2020  Vaping Use   Vaping Use: Former  Substance and Sexual Activity   Alcohol use: No    Alcohol/week: 0.0 standard drinks of alcohol   Drug use: No   Sexual activity: Not Currently  Other Topics Concern   Not on file  Social History Narrative   Not on file   Social Determinants of Health   Financial Resource Strain: Low Risk  (03/04/2017)   Overall Financial Resource Strain (CARDIA)    Difficulty of Paying Living Expenses: Not hard at all  Food Insecurity: No Food Insecurity (03/04/2017)   Hunger Vital Sign    Worried About Running Out of Food in the Last Year: Never true    [place] in the Last Year: Never true  Transportation Needs: No Transportation Needs (03/04/2017)   PRAPARE - Hydrologist (Medical): No    Lack of Transportation (Non-Medical): No  Physical Activity: Inactive (03/04/2017)   Exercise Vital Sign    Days of Exercise per Week: 0 days    Minutes of Exercise per Session: 0 min  Stress: No Stress Concern Present (03/04/2017)   [place]    Feeling of Stress : Not at all  Social Connections: Moderately Isolated (03/04/2017)   Social Connection and Isolation Panel [NHANES]    Frequency of Communication with Friends and Family: Once a week    Frequency of Social Gatherings with Friends and Family: Never    Attends Religious Services: Never    Printmaker: No    Attends Music therapist: Never    Marital Status: Married     Family History:  The patient's family history includes Alcohol abuse in her father, mother, and another family member; Bipolar disorder in her sister and another family member; Breast cancer in her sister; Cancer in her mother; Depression in her mother and another family member; Drug abuse in her sister; Emphysema  in her mother and sister; Esophageal cancer in her maternal aunt; Heart disease in her maternal grandmother, mother, and another family member; Lung cancer in  her mother; Mood Disorder in her father; Stroke in her maternal grandmother; Vision loss in an other family member. There is no history of Colon cancer, Colon polyps, Rectal cancer, or Stomach cancer.  ROS:   12-point review of systems is negative unless otherwise noted in the HPI.   EKGs/Labs/Other Studies Reviewed:    Studies reviewed were summarized above. The additional studies were reviewed today:  Zio patch 05/2020: The patient was monitored for 13 days, 19 hours. The predominant rhythm was sinus with an average rate of 84 bpm (range 65-143 bpm in sinus). There were rare PAC's and PVC's. Five atrial runs lasting up to 18.8 seconds occurred with a maximum rate of 156 bpm. No sustained arrhythmia or prolonged pause was identified. There were no patient triggered events.   Predominantly sinus rhythm with a few episodes of PSVT as well as rare PAC's and PVC's. __________  2D echo 04/14/2019: 1. Left ventricular ejection fraction, by visual estimation, is 60 to  65%. The left ventricle has normal function. There is no left ventricular  hypertrophy.   2. Elevated left atrial pressure.   3. Left ventricular diastolic parameters are consistent with Grade I  diastolic dysfunction (impaired relaxation).   4. The left ventricle has no regional wall motion abnormalities.   5. Global right ventricle has normal systolic function.The right  ventricular size is normal. No increase in right ventricular wall  thickness.   6. Left atrial size was normal.   7. Right atrial size was normal.   8. The mitral valve is normal in structure. Trivial mitral valve  regurgitation. No evidence of mitral stenosis.   9. The tricuspid valve is not well visualized.  10. The aortic valve was not well visualized. Aortic valve regurgitation  is not  visualized. No evidence of aortic valve sclerosis or stenosis.  11. The pulmonic valve was not well visualized. Pulmonic valve  regurgitation is not visualized.  12. TR signal is inadequate for assessing pulmonary artery systolic  pressure.  13. The inferior vena cava is normal in size with greater than 50%  respiratory variability, suggesting right atrial pressure of 3 mmHg.  14. The interatrial septum was not well visualized.   In comparison to the previous echocardiogram(s): Anterior/anterior septum  hypokinesis seen on previous test 11/06/2017. EF 50-55%. __________  [facility] MPI 03/20/2019: Pharmacological myocardial perfusion imaging study with no significant  ischemia Normal wall motion, EF estimated at 85% No EKG changes concerning for ischemia at peak stress or in recovery. CT attenuation correction images with mild coronary calcification at bifurcation of LAD/left circumflex, moderate calcification of the descending aorta Resting EKG with sinus rhythm, left bundle branch block Low risk scan __________  [facility] 11/08/2017: Ost 1st Sept lesion is 30% stenosed. 1st Mrg lesion is 30% stenosed. Prox RCA lesion is 30% stenosed. Mid RCA lesion is 30% stenosed. The left ventricular systolic function is normal. LV end diastolic pressure is mildly elevated.   1.  Mild nonobstructive coronary artery disease. 2.  Normal LV systolic function with an EF of 55% with borderline mid anterior wall hypokinesis likely due to IVCD. 3.  Right heart catheterization showed high normal filling pressures, normal pulmonary pressure and moderately reduced cardiac output.  Pulmonary capillary wedge pressure was 11 mmHg, PA pressure was 27 over 11 mmHg, cardiac output was 3.1 with a cardiac index of 1.8.   Recommendations: Continue medical therapy for diastolic heart failure.  Volume status has improved close to normal and thus I am going to  switch furosemide to oral. __________  2D echo 11/06/2017: -  Left ventricle: The cavity size was normal. Wall thickness was    normal. Systolic function was normal. The estimated ejection    fraction was in the range of 50% to 55%. There is hypokinesis of    the anteroseptal and anterior myocardium. Features are consistent    with a pseudonormal left ventricular filling pattern, with    concomitant abnormal relaxation and increased filling pressure    (grade 2 diastolic dysfunction). Doppler parameters are    consistent with high ventricular filling pressure.  - Mitral valve: There was mild regurgitation.  - Right ventricle: The cavity size was normal. Wall thickness was    normal. Systolic function was normal.  - Pulmonary arteries: Systolic pressure was mildly increased,    estimated to be 35 mm Hg.   Impressions:   - Compared with prior echo from 12/28/15, anterior/anteroseptal    hypokinesis is new. __________  [facility] 01/12/2016: Conclusions: Mild, non-obstructive coronary artery disease.  There is slightly sluggish flow throughout the coronary arteries, which may reflect microvascular dysfunction. Upper normal to mildly elevated left ventricular filling pressure.   Recommendations Continue risk factor modification. Uptitrate metoprolol and continue long-acting nitrate for possible component of microvascular dysfunction. Outpatient follow-up with myself. __________  2D echo 12/28/2015: - Procedure narrative: Transthoracic echocardiography. Image    quality was fair. The study was technically difficult.  - Left ventricle: The cavity size was normal. Wall thickness was    normal. Systolic function was normal. The estimated ejection    fraction was in the range of 55% to 60%. Wall motion was normal    grossly; there were no regional wall motion abnormalities. Left    ventricular diastolic function parameters were normal for the    patient&'s age. __________  Nuclear stress test 12/23/2015: There was no ST segment deviation noted during  stress. Defect 1: There is a small defect of mild severity present in the apical anterior and apex location. Findings suggestive small area of ischemia. However, shifting breast atteunation artifact can not be excluded. This is a low risk study. The left ventricular ejection fraction is normal (55-65%). TID was present.    EKG:  EKG is ordered today.  The EKG ordered today demonstrates NSR, 83 bpm, anterior T wave inversion, when compared to prior tracing LBBB is no longer present and anterior T waves were previously noted in EKG tracing that was without LBBB  Recent Labs: 10/18/2021: TSH 0.934 10/19/2021: ALT 21; BUN 5; Creatinine, Ser 0.57; Hemoglobin 13.2; Platelets 159; Potassium 3.6; Sodium 141  Recent Lipid Panel    Component Value Date/Time   CHOL 142 05/01/2021 0806   TRIG 172 (H) 05/01/2021 0806   HDL 38 (L) 05/01/2021 0806   CHOLHDL 3.7 05/01/2021 0806   CHOLHDL 3.1 03/14/2020 0836   VLDL 41 (H) 03/14/2020 0836   LDLCALC 75 05/01/2021 0806    PHYSICAL EXAM:    VS:  BP (!) 106/44 (BP Location: Left Arm, Patient Position: Sitting, Cuff Size: Normal)   Pulse 83   Ht '5\' 3"'$  (1.6 m)   Wt 139 lb 8 oz (63.3 kg)   SpO2 97%   BMI 24.71 kg/m   BMI: Body mass index is 24.71 kg/m.  Physical Exam Constitutional:      Appearance: She is well-developed.  HENT:     Head: Normocephalic and atraumatic.  Eyes:     General:        Right eye: No  discharge.        Left eye: No discharge.  Neck:     Comments: Aspen collar noted.  Unable to assess JVD or for carotid bruits. Cardiovascular:     Rate and Rhythm: Normal rate and regular rhythm.     Pulses:          Dorsalis pedis pulses are 2+ on the right side and 2+ on the left side.       Posterior tibial pulses are 2+ on the right side and 2+ on the left side.     Heart sounds: Normal heart sounds, S1 normal and S2 normal. Heart sounds not distant. No midsystolic click and no opening snap. No murmur heard.    No friction rub.   Pulmonary:     Effort: Pulmonary effort is normal. No respiratory distress.     Breath sounds: Normal breath sounds. No decreased breath sounds, wheezing or rales.  Chest:     Chest wall: No tenderness.  Abdominal:     General: There is no distension.  Musculoskeletal:     Cervical back: Normal range of motion.     Right lower leg: No edema.     Left lower leg: No edema.  Skin:    General: Skin is warm and dry.     Nails: There is no clubbing.  Neurological:     Mental Status: She is alert and oriented to person, place, and time.  Psychiatric:        Speech: Speech normal.        Behavior: Behavior normal.        Thought Content: Thought content normal.        Judgment: Judgment normal.     Wt Readings from Last 3 Encounters:  10/31/21 139 lb 8 oz (63.3 kg)  10/27/21 139 lb 1.6 oz (63.1 kg)  10/18/21 131 lb (59.4 kg)     ASSESSMENT & PLAN:   Nonobstructive CAD: No symptoms suggestive of angina.  Continue risk factor modification and secondary prevention with aspirin, atorvastatin, and metoprolol.  She does have an intermittent left bundle branch block, obtain echo.  HFpEF: She appears euvolemic and well compensated with NYHA class I symptoms.  Functional capacity has been diminished due to recent neck issues/surgery.  She remains on as needed furosemide.  PAD: No symptoms of claudication or nonhealing wounds.  Normal pulses along the bilateral lower extremities.  HLD: LDL 75.  She remains on atorvastatin 80 mg.  Lower extremity weakness: Less concerning for cardiac etiology without frank LOC or peri-episode symptoms.  Concern for possible decreased oral intake versus in the context of her cervical spine surgery.  We will obtain a Zio patch to evaluate for any significant arrhythmia, prolonged pauses, or evidence of high-grade AV block as well as an echo to evaluate for any structural abnormalities.  Otherwise, follow-up with primary care and orthopedics/neurology as  directed.   Disposition: F/u with Dr. Saunders Revel or an APP in 6 months.   Medication Adjustments/Labs and Tests Ordered: Current medicines are reviewed at length with the patient today.  Concerns regarding medicines are outlined above. Medication changes, Labs and Tests ordered today are summarized above and listed in the Patient Instructions accessible in Encounters.   Signed, Christell Faith, PA-C 10/31/2021 3:47 PM     [address] [place], [place] [address] [phone]

## 2021-10-30 DIAGNOSIS — Z4789 Encounter for other orthopedic aftercare: Secondary | ICD-10-CM | POA: Diagnosis not present

## 2021-10-30 DIAGNOSIS — E1169 Type 2 diabetes mellitus with other specified complication: Secondary | ICD-10-CM | POA: Diagnosis not present

## 2021-10-30 DIAGNOSIS — K219 Gastro-esophageal reflux disease without esophagitis: Secondary | ICD-10-CM | POA: Diagnosis not present

## 2021-10-30 DIAGNOSIS — E1151 Type 2 diabetes mellitus with diabetic peripheral angiopathy without gangrene: Secondary | ICD-10-CM | POA: Diagnosis not present

## 2021-10-30 DIAGNOSIS — I110 Hypertensive heart disease with heart failure: Secondary | ICD-10-CM | POA: Diagnosis not present

## 2021-10-30 DIAGNOSIS — Z7984 Long term (current) use of oral hypoglycemic drugs: Secondary | ICD-10-CM | POA: Diagnosis not present

## 2021-10-30 DIAGNOSIS — F419 Anxiety disorder, unspecified: Secondary | ICD-10-CM | POA: Diagnosis not present

## 2021-10-30 DIAGNOSIS — F17210 Nicotine dependence, cigarettes, uncomplicated: Secondary | ICD-10-CM | POA: Diagnosis not present

## 2021-10-30 DIAGNOSIS — F32A Depression, unspecified: Secondary | ICD-10-CM | POA: Diagnosis not present

## 2021-10-30 DIAGNOSIS — I2510 Atherosclerotic heart disease of native coronary artery without angina pectoris: Secondary | ICD-10-CM | POA: Diagnosis not present

## 2021-10-30 DIAGNOSIS — I5030 Unspecified diastolic (congestive) heart failure: Secondary | ICD-10-CM | POA: Diagnosis not present

## 2021-10-30 DIAGNOSIS — Z9181 History of falling: Secondary | ICD-10-CM | POA: Diagnosis not present

## 2021-10-30 DIAGNOSIS — E785 Hyperlipidemia, unspecified: Secondary | ICD-10-CM | POA: Diagnosis not present

## 2021-10-30 DIAGNOSIS — Z7982 Long term (current) use of aspirin: Secondary | ICD-10-CM | POA: Diagnosis not present

## 2021-10-30 DIAGNOSIS — Z981 Arthrodesis status: Secondary | ICD-10-CM | POA: Diagnosis not present

## 2021-10-30 DIAGNOSIS — F410 Panic disorder [episodic paroxysmal anxiety] without agoraphobia: Secondary | ICD-10-CM | POA: Diagnosis not present

## 2021-10-31 ENCOUNTER — Encounter: Payer: Self-pay | Admitting: Physician Assistant

## 2021-10-31 ENCOUNTER — Ambulatory Visit (INDEPENDENT_AMBULATORY_CARE_PROVIDER_SITE_OTHER): Payer: BC Managed Care – PPO

## 2021-10-31 ENCOUNTER — Ambulatory Visit (INDEPENDENT_AMBULATORY_CARE_PROVIDER_SITE_OTHER): Payer: BC Managed Care – PPO | Admitting: Physician Assistant

## 2021-10-31 VITALS — BP 106/44 | HR 83 | Ht 63.0 in | Wt 139.5 lb

## 2021-10-31 DIAGNOSIS — F32A Depression, unspecified: Secondary | ICD-10-CM | POA: Diagnosis not present

## 2021-10-31 DIAGNOSIS — I2510 Atherosclerotic heart disease of native coronary artery without angina pectoris: Secondary | ICD-10-CM

## 2021-10-31 DIAGNOSIS — I447 Left bundle-branch block, unspecified: Secondary | ICD-10-CM

## 2021-10-31 DIAGNOSIS — R29898 Other symptoms and signs involving the musculoskeletal system: Secondary | ICD-10-CM | POA: Diagnosis not present

## 2021-10-31 DIAGNOSIS — I10 Essential (primary) hypertension: Secondary | ICD-10-CM | POA: Diagnosis not present

## 2021-10-31 DIAGNOSIS — Z4789 Encounter for other orthopedic aftercare: Secondary | ICD-10-CM | POA: Diagnosis not present

## 2021-10-31 DIAGNOSIS — F419 Anxiety disorder, unspecified: Secondary | ICD-10-CM | POA: Diagnosis not present

## 2021-10-31 DIAGNOSIS — E1169 Type 2 diabetes mellitus with other specified complication: Secondary | ICD-10-CM | POA: Diagnosis not present

## 2021-10-31 DIAGNOSIS — Z7984 Long term (current) use of oral hypoglycemic drugs: Secondary | ICD-10-CM | POA: Diagnosis not present

## 2021-10-31 DIAGNOSIS — Z981 Arthrodesis status: Secondary | ICD-10-CM | POA: Diagnosis not present

## 2021-10-31 DIAGNOSIS — I739 Peripheral vascular disease, unspecified: Secondary | ICD-10-CM | POA: Diagnosis not present

## 2021-10-31 DIAGNOSIS — E785 Hyperlipidemia, unspecified: Secondary | ICD-10-CM | POA: Diagnosis not present

## 2021-10-31 DIAGNOSIS — I5032 Chronic diastolic (congestive) heart failure: Secondary | ICD-10-CM

## 2021-10-31 DIAGNOSIS — F410 Panic disorder [episodic paroxysmal anxiety] without agoraphobia: Secondary | ICD-10-CM | POA: Diagnosis not present

## 2021-10-31 DIAGNOSIS — K219 Gastro-esophageal reflux disease without esophagitis: Secondary | ICD-10-CM | POA: Diagnosis not present

## 2021-10-31 DIAGNOSIS — Z9181 History of falling: Secondary | ICD-10-CM | POA: Diagnosis not present

## 2021-10-31 DIAGNOSIS — I5030 Unspecified diastolic (congestive) heart failure: Secondary | ICD-10-CM | POA: Diagnosis not present

## 2021-10-31 DIAGNOSIS — F17210 Nicotine dependence, cigarettes, uncomplicated: Secondary | ICD-10-CM | POA: Diagnosis not present

## 2021-10-31 DIAGNOSIS — E1151 Type 2 diabetes mellitus with diabetic peripheral angiopathy without gangrene: Secondary | ICD-10-CM | POA: Diagnosis not present

## 2021-10-31 DIAGNOSIS — I110 Hypertensive heart disease with heart failure: Secondary | ICD-10-CM | POA: Diagnosis not present

## 2021-10-31 DIAGNOSIS — R42 Dizziness and giddiness: Secondary | ICD-10-CM

## 2021-10-31 DIAGNOSIS — Z7982 Long term (current) use of aspirin: Secondary | ICD-10-CM | POA: Diagnosis not present

## 2021-10-31 NOTE — Patient Instructions (Signed)
Medication Instructions:  No changes at this time.   *If you need a refill on your cardiac medications before your next appointment, please call your pharmacy*   Lab Work: None  If you have labs (blood work) drawn today and your tests are completely normal, you will receive your results only by: [place] (if you have MyChart) OR A paper copy in the mail If you have any lab test that is abnormal or we need to change your treatment, we will call you to review the results.   Testing/Procedures: Your physician has requested that you have an echocardiogram. Echocardiography is a painless test that uses sound waves to create images of your heart. It provides your doctor with information about the size and shape of your heart and how well your heart's chambers and valves are working. This procedure takes approximately one hour. There are no restrictions for this procedure.  Your physician has recommended that you wear a Zio monitor for 14 days.   This monitor is a medical device that records the heart's electrical activity. Doctors most often use these monitors to diagnose arrhythmias. Arrhythmias are problems with the speed or rhythm of the heartbeat. The monitor is a small device applied to your chest. You can wear one while you do your normal daily activities. While wearing this monitor if you have any symptoms to push the button and record what you felt. Once you have worn this monitor for the period of time provider prescribed (Usually 14 days), you will return the monitor device in the postage paid box. Once it is returned they will download the data collected and provide [name] with a report which the provider will then review and we will call you with those results. Important tips:  Avoid showering during the first 24 hours of wearing the monitor. Avoid excessive sweating to help maximize wear time. Do not submerge the device, no hot tubs, and no swimming pools. Keep any lotions or oils  away from the patch. After 24 hours you may shower with the patch on. Take brief showers with your back facing the shower head.  Do not remove patch once it has been placed because that will interrupt data and decrease adhesive wear time. Push the button when you have any symptoms and write down what you were feeling. Once you have completed wearing your monitor, remove and place into box which has postage paid and place in your outgoing mailbox.  If for some reason you have misplaced your box then call our office and we can provide another box and/or mail it off for you.      Follow-Up: At [facility], you and your health needs are our priority.  As part of our continuing mission to provide you with exceptional heart care, we have created designated Provider Care Teams.  These Care Teams include your primary Cardiologist (physician) and Advanced Practice Providers (APPs -  Physician Assistants and Nurse Practitioners) who all work together to provide you with the care you need, when you need it.    Your next appointment:   6 month(s)  The format for your next appointment:   In Person  Provider:   Nelva Bush, MD or Christell Faith, PA-C       Important Information About Sugar

## 2021-11-01 DIAGNOSIS — I5030 Unspecified diastolic (congestive) heart failure: Secondary | ICD-10-CM | POA: Diagnosis not present

## 2021-11-01 DIAGNOSIS — E1151 Type 2 diabetes mellitus with diabetic peripheral angiopathy without gangrene: Secondary | ICD-10-CM | POA: Diagnosis not present

## 2021-11-01 DIAGNOSIS — F17210 Nicotine dependence, cigarettes, uncomplicated: Secondary | ICD-10-CM | POA: Diagnosis not present

## 2021-11-01 DIAGNOSIS — Z4789 Encounter for other orthopedic aftercare: Secondary | ICD-10-CM | POA: Diagnosis not present

## 2021-11-01 DIAGNOSIS — E1169 Type 2 diabetes mellitus with other specified complication: Secondary | ICD-10-CM | POA: Diagnosis not present

## 2021-11-01 DIAGNOSIS — Z7982 Long term (current) use of aspirin: Secondary | ICD-10-CM | POA: Diagnosis not present

## 2021-11-01 DIAGNOSIS — F410 Panic disorder [episodic paroxysmal anxiety] without agoraphobia: Secondary | ICD-10-CM | POA: Diagnosis not present

## 2021-11-01 DIAGNOSIS — Z7984 Long term (current) use of oral hypoglycemic drugs: Secondary | ICD-10-CM | POA: Diagnosis not present

## 2021-11-01 DIAGNOSIS — I2510 Atherosclerotic heart disease of native coronary artery without angina pectoris: Secondary | ICD-10-CM | POA: Diagnosis not present

## 2021-11-01 DIAGNOSIS — K219 Gastro-esophageal reflux disease without esophagitis: Secondary | ICD-10-CM | POA: Diagnosis not present

## 2021-11-01 DIAGNOSIS — F32A Depression, unspecified: Secondary | ICD-10-CM | POA: Diagnosis not present

## 2021-11-01 DIAGNOSIS — E785 Hyperlipidemia, unspecified: Secondary | ICD-10-CM | POA: Diagnosis not present

## 2021-11-01 DIAGNOSIS — I110 Hypertensive heart disease with heart failure: Secondary | ICD-10-CM | POA: Diagnosis not present

## 2021-11-01 DIAGNOSIS — F419 Anxiety disorder, unspecified: Secondary | ICD-10-CM | POA: Diagnosis not present

## 2021-11-01 DIAGNOSIS — Z9181 History of falling: Secondary | ICD-10-CM | POA: Diagnosis not present

## 2021-11-01 DIAGNOSIS — Z981 Arthrodesis status: Secondary | ICD-10-CM | POA: Diagnosis not present

## 2021-11-01 LAB — BASIC METABOLIC PANEL
BUN/Creatinine Ratio: 9 — ABNORMAL LOW (ref 12–28)
BUN: 7 mg/dL — ABNORMAL LOW (ref 8–27)
CO2: 25 mmol/L (ref 20–29)
Calcium: 9.7 mg/dL (ref 8.7–10.3)
Chloride: 102 mmol/L (ref 96–106)
Creatinine, Ser: 0.76 mg/dL (ref 0.57–1.00)
Glucose: 129 mg/dL — ABNORMAL HIGH (ref 70–99)
Potassium: 4.9 mmol/L (ref 3.5–5.2)
Sodium: 142 mmol/L (ref 134–144)
eGFR: 86 mL/min/{1.73_m2} (ref >59)

## 2021-11-01 LAB — CBC WITH DIFFERENTIAL/PLATELET
Basophils Absolute: 0.1 10*3/uL (ref 0.0–0.2)
Basos: 1 %
EOS (ABSOLUTE): 0.3 10*3/uL (ref 0.0–0.4)
Eos: 3 %
Hematocrit: 43 % (ref 34.0–46.6)
Hemoglobin: 14.6 g/dL (ref 11.1–15.9)
Immature Grans (Abs): 0 10*3/uL (ref 0.0–0.1)
Immature Granulocytes: 0 %
Lymphocytes Absolute: 3 10*3/uL (ref 0.7–3.1)
Lymphs: 27 %
MCH: 30.4 pg (ref 26.6–33.0)
MCHC: 34 g/dL (ref 31.5–35.7)
MCV: 89 fL (ref 79–97)
Monocytes Absolute: 0.6 10*3/uL (ref 0.1–0.9)
Monocytes: 5 %
Neutrophils Absolute: 7.2 10*3/uL — ABNORMAL HIGH (ref 1.4–7.0)
Neutrophils: 64 %
Platelets: 275 10*3/uL (ref 150–450)
RBC: 4.81 x10E6/uL (ref 3.77–5.28)
RDW: 13.8 % (ref 11.7–15.4)
WBC: 11.1 10*3/uL — ABNORMAL HIGH (ref 3.4–10.8)

## 2021-11-01 NOTE — Progress Notes (Signed)
Please, let know Katelyn Cooper that her labs are stable for her . Please, contact [facility] and ask if we could add CK for her? Thank you

## 2021-11-02 ENCOUNTER — Telehealth: Payer: Self-pay

## 2021-11-02 NOTE — Telephone Encounter (Signed)
FYI

## 2021-11-02 NOTE — Telephone Encounter (Signed)
Copied from [place]. Topic: General - Other >> Nov 01, 2021  4:50 PM Oley Balm E wrote: Reason for CRM: Katelyn Cooper calling from [place] called to report that the patient has been evaluated and is not in need of OT services at this time  Best contact: [phone]

## 2021-11-03 NOTE — Progress Notes (Signed)
  Hello Vinaya Sancho Niehoff ,   CK results was negative as well.  Any questions please reach out to the office or message me on MyChart!  Best, Mardene Speak, PA-C

## 2021-11-06 ENCOUNTER — Telehealth: Payer: Self-pay | Admitting: Internal Medicine

## 2021-11-06 DIAGNOSIS — Z981 Arthrodesis status: Secondary | ICD-10-CM | POA: Diagnosis not present

## 2021-11-06 DIAGNOSIS — E785 Hyperlipidemia, unspecified: Secondary | ICD-10-CM | POA: Diagnosis not present

## 2021-11-06 DIAGNOSIS — I110 Hypertensive heart disease with heart failure: Secondary | ICD-10-CM | POA: Diagnosis not present

## 2021-11-06 DIAGNOSIS — Z9181 History of falling: Secondary | ICD-10-CM | POA: Diagnosis not present

## 2021-11-06 DIAGNOSIS — K219 Gastro-esophageal reflux disease without esophagitis: Secondary | ICD-10-CM | POA: Diagnosis not present

## 2021-11-06 DIAGNOSIS — F410 Panic disorder [episodic paroxysmal anxiety] without agoraphobia: Secondary | ICD-10-CM | POA: Diagnosis not present

## 2021-11-06 DIAGNOSIS — F17210 Nicotine dependence, cigarettes, uncomplicated: Secondary | ICD-10-CM | POA: Diagnosis not present

## 2021-11-06 DIAGNOSIS — I5030 Unspecified diastolic (congestive) heart failure: Secondary | ICD-10-CM | POA: Diagnosis not present

## 2021-11-06 DIAGNOSIS — I2510 Atherosclerotic heart disease of native coronary artery without angina pectoris: Secondary | ICD-10-CM | POA: Diagnosis not present

## 2021-11-06 DIAGNOSIS — F32A Depression, unspecified: Secondary | ICD-10-CM | POA: Diagnosis not present

## 2021-11-06 DIAGNOSIS — Z7984 Long term (current) use of oral hypoglycemic drugs: Secondary | ICD-10-CM | POA: Diagnosis not present

## 2021-11-06 DIAGNOSIS — E1169 Type 2 diabetes mellitus with other specified complication: Secondary | ICD-10-CM | POA: Diagnosis not present

## 2021-11-06 DIAGNOSIS — Z4789 Encounter for other orthopedic aftercare: Secondary | ICD-10-CM | POA: Diagnosis not present

## 2021-11-06 DIAGNOSIS — Z7982 Long term (current) use of aspirin: Secondary | ICD-10-CM | POA: Diagnosis not present

## 2021-11-06 DIAGNOSIS — F419 Anxiety disorder, unspecified: Secondary | ICD-10-CM | POA: Diagnosis not present

## 2021-11-06 DIAGNOSIS — E1151 Type 2 diabetes mellitus with diabetic peripheral angiopathy without gangrene: Secondary | ICD-10-CM | POA: Diagnosis not present

## 2021-11-06 MED ORDER — METOPROLOL TARTRATE 25 MG PO TABS: 25.0000 mg | ORAL_TABLET | Freq: Two times a day (BID) | ORAL | 0 refills | Status: DC

## 2021-11-06 MED ORDER — POTASSIUM CHLORIDE CRYS ER 20 MEQ PO TBCR: EXTENDED_RELEASE_TABLET | ORAL | 0 refills | Status: DC

## 2021-11-06 MED ORDER — FUROSEMIDE 20 MG PO TABS: ORAL_TABLET | ORAL | 0 refills | Status: DC

## 2021-11-06 NOTE — Telephone Encounter (Signed)
*  STAT* If patient is at the pharmacy, call can be transferred to refill team.   1. Which medications need to be refilled? (please list name of each medication and dose if known)  furosemide (LASIX) 20 MG tablet potassium chloride SA (KLOR-CON M20) 20 MEQ tablet metoprolol tartrate (LOPRESSOR) 25 MG tablet  2. Which pharmacy/location (including street and city if local pharmacy) is medication to be sent to? [place], [address]   3. Do they need a 30 day or 90 day supply?  90 day supply

## 2021-11-08 DIAGNOSIS — Z4789 Encounter for other orthopedic aftercare: Secondary | ICD-10-CM | POA: Diagnosis not present

## 2021-11-08 DIAGNOSIS — I2510 Atherosclerotic heart disease of native coronary artery without angina pectoris: Secondary | ICD-10-CM | POA: Diagnosis not present

## 2021-11-08 DIAGNOSIS — I110 Hypertensive heart disease with heart failure: Secondary | ICD-10-CM | POA: Diagnosis not present

## 2021-11-08 DIAGNOSIS — Z7984 Long term (current) use of oral hypoglycemic drugs: Secondary | ICD-10-CM | POA: Diagnosis not present

## 2021-11-08 DIAGNOSIS — E785 Hyperlipidemia, unspecified: Secondary | ICD-10-CM | POA: Diagnosis not present

## 2021-11-08 DIAGNOSIS — I5030 Unspecified diastolic (congestive) heart failure: Secondary | ICD-10-CM | POA: Diagnosis not present

## 2021-11-08 DIAGNOSIS — F17210 Nicotine dependence, cigarettes, uncomplicated: Secondary | ICD-10-CM | POA: Diagnosis not present

## 2021-11-08 DIAGNOSIS — E1169 Type 2 diabetes mellitus with other specified complication: Secondary | ICD-10-CM | POA: Diagnosis not present

## 2021-11-08 DIAGNOSIS — Z9181 History of falling: Secondary | ICD-10-CM | POA: Diagnosis not present

## 2021-11-08 DIAGNOSIS — F410 Panic disorder [episodic paroxysmal anxiety] without agoraphobia: Secondary | ICD-10-CM | POA: Diagnosis not present

## 2021-11-08 DIAGNOSIS — F32A Depression, unspecified: Secondary | ICD-10-CM | POA: Diagnosis not present

## 2021-11-08 DIAGNOSIS — Z981 Arthrodesis status: Secondary | ICD-10-CM | POA: Diagnosis not present

## 2021-11-08 DIAGNOSIS — E1151 Type 2 diabetes mellitus with diabetic peripheral angiopathy without gangrene: Secondary | ICD-10-CM | POA: Diagnosis not present

## 2021-11-08 DIAGNOSIS — Z7982 Long term (current) use of aspirin: Secondary | ICD-10-CM | POA: Diagnosis not present

## 2021-11-08 DIAGNOSIS — K219 Gastro-esophageal reflux disease without esophagitis: Secondary | ICD-10-CM | POA: Diagnosis not present

## 2021-11-08 DIAGNOSIS — F419 Anxiety disorder, unspecified: Secondary | ICD-10-CM | POA: Diagnosis not present

## 2021-11-09 LAB — SPECIMEN STATUS REPORT

## 2021-11-09 LAB — CK: Total CK: 56 U/L (ref 32–182)

## 2021-11-13 DIAGNOSIS — Z4789 Encounter for other orthopedic aftercare: Secondary | ICD-10-CM | POA: Diagnosis not present

## 2021-11-13 DIAGNOSIS — E1169 Type 2 diabetes mellitus with other specified complication: Secondary | ICD-10-CM | POA: Diagnosis not present

## 2021-11-13 DIAGNOSIS — E785 Hyperlipidemia, unspecified: Secondary | ICD-10-CM | POA: Diagnosis not present

## 2021-11-13 DIAGNOSIS — Z7984 Long term (current) use of oral hypoglycemic drugs: Secondary | ICD-10-CM | POA: Diagnosis not present

## 2021-11-13 DIAGNOSIS — I5030 Unspecified diastolic (congestive) heart failure: Secondary | ICD-10-CM | POA: Diagnosis not present

## 2021-11-13 DIAGNOSIS — F419 Anxiety disorder, unspecified: Secondary | ICD-10-CM | POA: Diagnosis not present

## 2021-11-13 DIAGNOSIS — F17210 Nicotine dependence, cigarettes, uncomplicated: Secondary | ICD-10-CM | POA: Diagnosis not present

## 2021-11-13 DIAGNOSIS — E1151 Type 2 diabetes mellitus with diabetic peripheral angiopathy without gangrene: Secondary | ICD-10-CM | POA: Diagnosis not present

## 2021-11-13 DIAGNOSIS — I110 Hypertensive heart disease with heart failure: Secondary | ICD-10-CM | POA: Diagnosis not present

## 2021-11-13 DIAGNOSIS — K219 Gastro-esophageal reflux disease without esophagitis: Secondary | ICD-10-CM | POA: Diagnosis not present

## 2021-11-13 DIAGNOSIS — F32A Depression, unspecified: Secondary | ICD-10-CM | POA: Diagnosis not present

## 2021-11-13 DIAGNOSIS — Z981 Arthrodesis status: Secondary | ICD-10-CM | POA: Diagnosis not present

## 2021-11-13 DIAGNOSIS — Z7982 Long term (current) use of aspirin: Secondary | ICD-10-CM | POA: Diagnosis not present

## 2021-11-13 DIAGNOSIS — Z9181 History of falling: Secondary | ICD-10-CM | POA: Diagnosis not present

## 2021-11-13 DIAGNOSIS — I2510 Atherosclerotic heart disease of native coronary artery without angina pectoris: Secondary | ICD-10-CM | POA: Diagnosis not present

## 2021-11-13 DIAGNOSIS — F410 Panic disorder [episodic paroxysmal anxiety] without agoraphobia: Secondary | ICD-10-CM | POA: Diagnosis not present

## 2021-11-17 ENCOUNTER — Telehealth: Payer: Self-pay | Admitting: Internal Medicine

## 2021-11-17 ENCOUNTER — Other Ambulatory Visit: Payer: Self-pay | Admitting: Psychiatry

## 2021-11-17 DIAGNOSIS — M5412 Radiculopathy, cervical region: Secondary | ICD-10-CM | POA: Diagnosis not present

## 2021-11-17 NOTE — Telephone Encounter (Signed)
Patient called stating she received her heart monitor but would like to know if she should wait to start this until after her Echo on 9/1.

## 2021-11-17 NOTE — Telephone Encounter (Signed)
Spoke with patient and reviewed that we would like her to wait until after her echocardiogram has been done before placing that monitor. She verbalized understanding with no further questions at this time.

## 2021-11-22 ENCOUNTER — Encounter: Payer: BC Managed Care – PPO | Admitting: Dermatology

## 2021-11-22 ENCOUNTER — Other Ambulatory Visit: Payer: Self-pay | Admitting: Physician Assistant

## 2021-11-22 DIAGNOSIS — I447 Left bundle-branch block, unspecified: Secondary | ICD-10-CM

## 2021-11-30 ENCOUNTER — Ambulatory Visit: Payer: BC Managed Care – PPO | Admitting: Internal Medicine

## 2021-12-01 ENCOUNTER — Ambulatory Visit: Payer: BC Managed Care – PPO | Attending: Physician Assistant

## 2021-12-01 DIAGNOSIS — I447 Left bundle-branch block, unspecified: Secondary | ICD-10-CM | POA: Diagnosis not present

## 2021-12-01 LAB — ECHOCARDIOGRAM COMPLETE
AR max vel: 2.26 cm2
AV Area VTI: 2.28 cm2
AV Area mean vel: 2.3 cm2
AV Mean grad: 3 mmHg
AV Peak grad: 5.3 mmHg
Ao pk vel: 1.15 m/s
Area-P 1/2: 4.89 cm2
Calc EF: 55.8 %
S' Lateral: 3.4 cm
Single Plane A2C EF: 57 %
Single Plane A4C EF: 54.4 %

## 2021-12-04 ENCOUNTER — Other Ambulatory Visit: Payer: Self-pay | Admitting: Internal Medicine

## 2021-12-04 DIAGNOSIS — I447 Left bundle-branch block, unspecified: Secondary | ICD-10-CM | POA: Diagnosis not present

## 2021-12-04 DIAGNOSIS — R42 Dizziness and giddiness: Secondary | ICD-10-CM | POA: Diagnosis not present

## 2021-12-05 ENCOUNTER — Telehealth: Payer: Self-pay | Admitting: *Deleted

## 2021-12-05 NOTE — Telephone Encounter (Signed)
-----   Message from Rise Mu, PA-C sent at 12/05/2021  8:02 AM EDT ----- Echo showed normal pump function, normal relaxation of the heart, mildly elevated PASP, mildly to moderately leaky mitral valve, and normal pressure within the upper right side of the heart.  When compared to prior echo, the mitral valve is a little more leaky, and can be monitored with periodic echo.

## 2021-12-05 NOTE — Telephone Encounter (Signed)
Reviewed results with patient and she verbalized understanding with no further questions at this time.  

## 2021-12-05 NOTE — Telephone Encounter (Signed)
Left voicemail message to call back for review of results.  

## 2021-12-05 NOTE — Telephone Encounter (Signed)
Pt returning nurses call regarding test results. Please advise 

## 2021-12-06 ENCOUNTER — Encounter: Payer: Self-pay | Admitting: Psychiatry

## 2021-12-06 ENCOUNTER — Telehealth (INDEPENDENT_AMBULATORY_CARE_PROVIDER_SITE_OTHER): Payer: BC Managed Care – PPO | Admitting: Psychiatry

## 2021-12-06 DIAGNOSIS — F3341 Major depressive disorder, recurrent, in partial remission: Secondary | ICD-10-CM

## 2021-12-06 DIAGNOSIS — G4700 Insomnia, unspecified: Secondary | ICD-10-CM

## 2021-12-06 MED ORDER — QUETIAPINE FUMARATE 100 MG PO TABS: 100.0000 mg | ORAL_TABLET | Freq: Every day | ORAL | 0 refills | Status: DC

## 2021-12-06 MED ORDER — VENLAFAXINE HCL ER 75 MG PO CP24: 75.0000 mg | ORAL_CAPSULE | Freq: Every day | ORAL | 0 refills | Status: DC

## 2021-12-06 MED ORDER — ZALEPLON 10 MG PO CAPS: 10.0000 mg | ORAL_CAPSULE | Freq: Every evening | ORAL | 2 refills | Status: DC | PRN

## 2021-12-06 NOTE — Progress Notes (Signed)
Virtual Visit via Video Note  I connected with Katelyn Cooper on 12/06/21 at  1:00 PM EDT by a video enabled telemedicine application and verified that I am speaking with the correct person using two identifiers.  Location: Patient: home Provider: office Persons participated in the visit- patient, provider    I discussed the limitations of evaluation and management by telemedicine and the availability of in person appointments. The patient expressed understanding and agreed to proceed.     I discussed the assessment and treatment plan with the patient. The patient was provided an opportunity to ask questions and all were answered. The patient agreed with the plan and demonstrated an understanding of the instructions.   The patient was advised to call back or seek an in-person evaluation if the symptoms worsen or if the condition fails to improve as anticipated.  I provided 15 minutes of non-face-to-face time during this encounter.   [name], MD    [facility] MD/PA/NP OP Progress Note  12/06/2021 1:24 PM Katelyn Cooper  MRN:  [id number]  Chief Complaint:  Chief Complaint  Patient presents with   Follow-up   Depression   HPI:  This is a follow-up appointment for depression and insomnia.  She states that she was found to have herniation of neck.  Although the surgery (cervical fusion ) went well, she was admitted due to developed her leg gave out. She denies any dizziness.  She has not had any episode since discharge from the hospital.  She is planning to take a walk with her friend.  She is trying to regain physical strength.  She was able to go to grocery shopping with her husband the other day.  She describes her husband as great, and reports great relationship with him.  Although she was feeling down due to her recent physical issues, she denies any mood symptoms otherwise.  She sleeps well with Sonata.  She denies change in appetite.  She denies SI.  She denies irritability.  She  denies decreased need for sleep or euphonia.  She denies alcohol use or drug use.   She feels comfortable to stay on the current medication regimen.     Exercise: walk 3 miles, 3 days a week with her friend Employment: retired/Used to work for [employer] in 2018, work 3 days a week at [employer].  Support: husband Household: husband Marital status: married in 1996. Married twice Number of children: 0. 1 step son from previous marriage (she raised him. 67 yo in 2022) Last PCP / ongoing medical evaluation:  due next month for annual visit   Visit Diagnosis:    ICD-10-CM   1. Insomnia, unspecified type  G47.00 zaleplon (SONATA) 10 MG capsule    2. MDD (major depressive disorder), recurrent, in partial remission (HCC)  F33.41 venlafaxine XR (EFFEXOR-XR) 75 MG 24 hr capsule      Past Psychiatric History: Please see initial evaluation for full details. I have reviewed the history. No updates at this time.     Past Medical History:  Past Medical History:  Diagnosis Date   (HFpEF) heart failure with preserved ejection fraction ([place])    a. 12/2015 Echo: EF 55-60%, nl RV size/fxn, no significant valvular abnormalities; b. 10/2017 Echo: EF 50-55%, antsept, ant HK. Gr2 DD. Mild MR. Nl RV fxn. PASP 43mHg.   Anxiety    Basal cell carcinoma 08/08/2017   L nose above mid nasal alar groove   CHF (congestive heart failure) ([place]  Depression    Diabetes type 2, controlled ([place])    diet-controlled   Esophageal stricture    GERD (gastroesophageal reflux disease)    Hiatal hernia    History of chicken pox    Hyperlipidemia    Hypertension    Internal hemorrhoids    Non-obstructive CAD (coronary artery disease)    a. 12/2015 MV: apical ant and apical reversible defect. EF 55-65%; b. 01/2016 Cath: LM nl, LAD nl, SP1 40ost, LCX nl, OM1 30, RCA 30p/m, EDP 15-2mHg; c. 10/2017 Cath: LM nl, LAD nl, SP1 30ost, LCX nl, OM1 30, RCA 30p/m. EF 55%.   Obstructive sleep apnea    PAD (peripheral artery  disease) ([place]    a. 1990's s/p prior LCIA stenting; b. 12/2015 ABI: R 1.05, L 1.03.   Panic disorder     Past Surgical History:  Procedure Laterality Date   AORTA SURGERY  1999   stent placement; ? left iliac artery intervention   APPENDECTOMY  1998   BASAL CELL CARCINOMA EXCISION     CARDIAC CATHETERIZATION N/A 01/12/2016   Procedure: Left Heart Cath and Coronary Angiography;  Surgeon: CNelva Bush MD;  [facility];  Service: Cardiovascular;  Laterality: N/A;   COLONOSCOPY  07/17/2019   FOOT SURGERY     Right   pap smear  03/2010   Done by Dr. AEverett Graff normal   REFRACTIVE SURGERY     right   RIB RESECTION  15427,[id number]  RIGHT/LEFT HEART CATH AND CORONARY ANGIOGRAPHY N/A 11/08/2017   Procedure: RIGHT/LEFT HEART CATH AND CORONARY ANGIOGRAPHY;  Surgeon: AWellington Hampshire MD;  [facility];  Service: Cardiovascular;  Laterality: N/A;   SHOULDER SURGERY  2005   left   UPPER GASTROINTESTINAL ENDOSCOPY  07/17/2019   UPPER GI ENDOSCOPY  09/19/2011   Stricture at GE junction, dilated. Mild gastritis and duodenitis. Small hiatal hernia    Family Psychiatric History: Please see initial evaluation for full details. I have reviewed the history. No updates at this time.     Family History:  Family History  Problem Relation Age of Onset   Lung cancer Mother        was a smoker   Emphysema Mother    Cancer Mother        Lung   Alcohol abuse Mother    Depression Mother    Heart disease Mother    Drug abuse Sister    Breast cancer Sister        x 2  (30&50)   Emphysema Sister    Bipolar disorder Sister    Alcohol abuse Other    Depression Other    Bipolar disorder Other    Heart disease Other    Vision loss Other    Alcohol abuse Father    Mood Disorder Father    Heart disease Maternal Grandmother    Stroke Maternal Grandmother    Esophageal cancer Maternal Aunt    Colon cancer Neg Hx    Colon polyps Neg Hx    Rectal cancer Neg Hx     Stomach cancer Neg Hx     Social History:  Social History   Socioeconomic History   Marital status: Married    Spouse name: mark   Number of children: 0   Years of education: Not on file   Highest education level: High school graduate  Occupational History   Occupation: computer-retired    Employer: [employer]  Tobacco Use  Smoking status: Every Day    Packs/day: 1.00    Years: 40.00    Total pack years: 40.00    Types: Cigarettes    Start date: 03/01/1975    Last attempt to quit: 08/11/2017    Years since quitting: 4.3   Smokeless tobacco: Never   Tobacco comments:    0.75PPD 10/11/2020  Vaping Use   Vaping Use: Former  Substance and Sexual Activity   Alcohol use: No    Alcohol/week: 0.0 standard drinks of alcohol   Drug use: No   Sexual activity: Not Currently  Other Topics Concern   Not on file  Social History Narrative   Not on file   Social Determinants of Health   Financial Resource Strain: Low Risk  (03/04/2017)   Overall Financial Resource Strain (CARDIA)    Difficulty of Paying Living Expenses: Not hard at all  Food Insecurity: No Food Insecurity (03/04/2017)   Hunger Vital Sign    Worried About Running Out of Food in the Last Year: Never true    Ran Out of Food in the Last Year: Never true  Transportation Needs: No Transportation Needs (03/04/2017)   PRAPARE - Hydrologist (Medical): No    Lack of Transportation (Non-Medical): No  Physical Activity: Inactive (03/04/2017)   Exercise Vital Sign    Days of Exercise per Week: 0 days    Minutes of Exercise per Session: 0 min  Stress: No Stress Concern Present (03/04/2017)   [place]    Feeling of Stress : Not at all  Social Connections: Moderately Isolated (03/04/2017)   Social Connection and Isolation Panel [NHANES]    Frequency of Communication with Friends and Family: Once a week    Frequency of Social  Gatherings with Friends and Family: Never    Attends Religious Services: Never    Marine scientist or Organizations: No    Attends Archivist Meetings: Never    Marital Status: Married    Allergies:  Allergies  Allergen Reactions   Lovastatin     depression   Paroxetine Hcl     itching   Lamotrigine Rash    Metabolic Disorder Labs: Lab Results  Component Value Date   HGBA1C 6.6 (A) 04/24/2021   No results found for: "PROLACTIN" Lab Results  Component Value Date   CHOL 142 05/01/2021   TRIG 172 (H) 05/01/2021   HDL 38 (L) 05/01/2021   CHOLHDL 3.7 05/01/2021   VLDL 41 (H) 03/14/2020   LDLCALC 75 05/01/2021   LDLCALC 53 03/14/2020   Lab Results  Component Value Date   TSH 0.934 10/18/2021   TSH 1.660 06/02/2020    Therapeutic Level Labs: No results found for: "LITHIUM" No results found for: "VALPROATE" No results found for: "CBMZ"  Current Medications: Current Outpatient Medications  Medication Sig Dispense Refill   acetaminophen (TYLENOL) 500 MG tablet Take 500 mg by mouth every 6 (six) hours as needed.     albuterol (VENTOLIN HFA) 108 (90 Base) MCG/ACT inhaler Ventolin HFA 90 mcg/actuation aerosol inhaler  inhale 2 puffs by mouth every 6 hours if needed for wheezing or shortness of breath     aspirin EC 81 MG tablet Take 1 tablet (81 mg total) by mouth daily.     atorvastatin (LIPITOR) 80 MG tablet Take 1 tablet (80 mg total) by mouth daily. 90 tablet 0   b complex vitamins capsule Take 1 capsule by  mouth daily.     Cholecalciferol (VITAMIN D3) 5000 units TABS Take 1 tablet by mouth daily.   0   docusate sodium (COLACE) 100 MG capsule Take 100 mg by mouth daily as needed.     furosemide (LASIX) 20 MG tablet Take 1 tablet by mouth daily as needed for increased fluid or weight gain. 90 tablet 0   gabapentin (NEURONTIN) 300 MG capsule Take 300 mg by mouth 3 (three) times daily. (Patient not taking: Reported on 10/31/2021)     glucose blood test strip  ONETOUCH ULTRA MINI STRIPS AND LANCETS. Use one daily to check blood sugar. 100 each 3   lansoprazole (PREVACID) 15 MG capsule Take 15 mg by mouth daily at 6 (six) AM.     metFORMIN (GLUCOPHAGE-XR) 500 MG 24 hr tablet TAKE 1 TABLET BY MOUTH EVERY EVENING . GENERIC EQUIVALENT FOR GLUCOPHAGE-XR 90 tablet 4   methocarbamol (ROBAXIN) 500 MG tablet Take 500 mg by mouth 2 (two) times daily. (Patient not taking: Reported on 10/27/2021)     metoprolol tartrate (LOPRESSOR) 25 MG tablet Take 1 tablet (25 mg total) by mouth 2 (two) times daily. 180 tablet 0   Multiple Vitamins-Minerals (MULTI-VITAMIN GUMMIES PO) Take by mouth. Taking 1 daily     ondansetron (ZOFRAN-ODT) 4 MG disintegrating tablet 4 mg 2 (two) times daily as needed. (Patient not taking: Reported on 10/27/2021)     oxyCODONE (OXY IR/ROXICODONE) 5 MG immediate release tablet Take 5 mg by mouth every 4 (four) hours as needed. (Patient not taking: Reported on 10/27/2021)     potassium chloride SA (KLOR-CON M20) 20 MEQ tablet Take 2 tablets (40 meq) by mouth once daily only on days you are taking lasix 90 tablet 0   [START ON 12/08/2021] QUEtiapine (SEROQUEL) 100 MG tablet Take 1 tablet (100 mg total) by mouth at bedtime. 90 tablet 0   traMADol (ULTRAM) 50 MG tablet Take 50 mg by mouth every 6 (six) hours. (Patient not taking: Reported on 10/27/2021)     [START ON 01/03/2022] venlafaxine XR (EFFEXOR-XR) 75 MG 24 hr capsule Take 1 capsule (75 mg total) by mouth daily with breakfast. 90 capsule 0   [START ON 12/31/2021] zaleplon (SONATA) 10 MG capsule Take 1 capsule (10 mg total) by mouth at bedtime as needed for sleep. 30 capsule 2   No current facility-administered medications for this visit.     Musculoskeletal: Strength & Muscle Tone:  N/A Gait & Station:  N/A Patient leans: N/A  Psychiatric Specialty Exam: Review of Systems  Psychiatric/Behavioral:  Negative for agitation, behavioral problems, confusion, decreased concentration, dysphoric mood,  hallucinations, self-injury, sleep disturbance and suicidal ideas. The patient is not nervous/anxious and is not hyperactive.   All other systems reviewed and are negative.   There were no vitals taken for this visit.There is no height or weight on file to calculate BMI.  General Appearance: Fairly Groomed  Eye Contact:  Good  Speech:  Clear and Coherent  Volume:  Normal  Mood:   good  Affect:  Appropriate, Congruent, and Full Range  Thought Process:  Coherent  Orientation:  Full (Time, Place, and Person)  Thought Content: Logical   Suicidal Thoughts:  No  Homicidal Thoughts:  No  Memory:  Immediate;   Good  Judgement:  Good  Insight:  Good  Psychomotor Activity:  Normal  Concentration:  Concentration: Good and Attention Span: Good  Recall:  Good  Fund of Knowledge: Good  Language: Good  Akathisia:  No  Handed:  Right  AIMS (if indicated): not done  Assets:  Communication Skills Desire for Improvement  ADL's:  Intact  Cognition: WNL  Sleep:  Good   Screenings: [place] Office Visit from 03/10/2018 in [place] Total Score 0      PHQ2-9    [place] Office Visit from 08/10/2021 in [place] Visit from 04/24/2021 in [facility] Video Visit from 03/14/2021 in [place] Office Visit from 01/16/2021 in [place] Video Visit from 10/05/2020 in [place]  PHQ-2 Total Score 0 0 0 0 0  PHQ-9 Total Score 0 1 -- -- --      Flowsheet Row ED to Hosp-Admission (Discharged) from 10/18/2021 in [place] Video Visit from 03/14/2021 in [place] Video Visit from 10/05/2020 in [place] No Risk No Risk No Risk        Assessment and Plan:  Katelyn Cooper is a 66 y.o. year old female  with a history of  PTSD, depression, nonobstructive CAD, PAD status post left iliac stent, chronic HFpEF, diet-controlled DM2, thoracic outlet syndrome status post bilateral rib resections (4098), GERD complicated by esophageal stricture, HLD, who presents for follow up appointment for below.   2. MDD (major depressive disorder), recurrent, in partial remission ([place]) She denies any significant mood symptoms except occasional down mood due to recent admission to the hospital. She reports good support from her husband, and maintains good connection with her friend.  Will continue current dose of venlafaxine and quetiapine to target depression. Noted that although she was diagnosed with bipolar 1 disorder in the past, she denies any significant manic symptoms except irritability.  Will continue to monitor.   1. Insomnia, unspecified type Improving.  She reports good benefit from [place].  Will continue current dose to target insomnia.    Plan Continue venlafaxine 75 mg daily Continue quetiapine 100 mg at night (on metformin)  Continue Sonata 10 mg at night  Next appointment - 12/6 at 8:40 for 20 mins, video - on Vagifem 10 mg, twice a week, inseritble - on Tramadol, gabapentin 300 mg tid,    Past trials of medication-  Paxil, mirtazapine, Latuda (insomnia), Abilify (worsening in anxiety, insomnia), Lamotrigine, Gabapentin   The patient demonstrates the following risk factors for suicide: Chronic risk factors for suicide include: psychiatric disorder of depression . Acute risk factors for suicide include: N/A. Protective factors for this patient include: positive social support, responsibility to others (children, family), coping skills, and hope for the future. Considering these factors, the overall suicide risk at this point appears to be low. Patient is appropriate for outpatient follow up.     This clinician has discussed the side effect associated with medication prescribed during this encounter.  Please refer to notes in the previous encounters for more details.   I have utilized the [place] Controlled Substances Reporting System (PMP AWARxE) to confirm adherence regarding the patient's medication. My review reveals appropriate prescription fills.      Collaboration of Care: Collaboration of Care: Other N/A  Patient/Guardian was advised Release of Information must be obtained prior to any record release in order to collaborate their care with an outside provider. Patient/Guardian was advised if they have not already done so to contact the registration department to sign all necessary forms in order for [name] to release information regarding their  care.   Consent: Patient/Guardian gives verbal consent for treatment and assignment of benefits for services provided during this visit. Patient/Guardian expressed understanding and agreed to proceed.    [name], MD 12/06/2021, 1:24 PM

## 2021-12-06 NOTE — Patient Instructions (Signed)
Continue venlafaxine 75 mg daily Continue quetiapine 100 mg at night (on metformin)  Continue Sonata 10 mg at night  Next appointment - 12/6 at 8:40

## 2021-12-12 ENCOUNTER — Ambulatory Visit: Payer: Self-pay

## 2021-12-12 NOTE — Patient Instructions (Signed)
Visit Information  Thank you for taking time to visit with me today. Please don't hesitate to contact me if I can be of assistance to you.   Following are the goals we discussed today:   Goals Addressed             This Visit's Progress    COMPLETED: RNCM: Effective Management of HF       Care Coordination Interventions:  Basic overview and discussion of pathophysiology of Heart Failure reviewed Provided education on low sodium diet. Education and support given Reviewed role of diuretics in prevention of fluid overload and management of heart failure; Discussed the importance of keeping all appointments with provider Provided patient with education about the role of exercise in the management of heart failure Referral made to community resources care guide team for assistance with any resources the patient may need for future needs. Education and support given; Advised patient to discuss changes in her HF, heart health, or changes in her other chronic conditions with provider Screening for signs and symptoms of depression related to chronic disease state  Assessed social determinant of health barriers  Review of the care coordination program and how to reach the [facility]. The patient is working with the cardiologist right now and denies any concerns at this time. States she is doing well since recovering from her surgery and being out of the hospital. Review of how to reach the [facility] for new concerns or needs Had a new ECHO on 12-03-2021 and EF was 55 to 60%. Review of EF% and what the % meant. Education and support            Please call the care guide team at [phone] if you need to schedule an appointment.   If you are experiencing a Mental Health or [place] or need someone to talk to, please call the Suicide and Crisis Lifeline: 988 call the [place] National Suicide Prevention Lifeline: 302-067-0553 or TTY: 980 648 6596 TTY (256)691-0085) to talk to a trained  counselor call 1-800-273-TALK (toll free, 24 hour hotline)  Patient verbalizes understanding of instructions and care plan provided today and agrees to view in [place]. Active MyChart status and patient understanding of how to access instructions and care plan via MyChart confirmed with patient.     No further follow up required: the patient knows how to reach the [facility] for new questions or concerns.  Noreene Larsson RN, MSN, CCM Community Care Coordinator [place] [facility] Mobile: [phone]

## 2021-12-12 NOTE — Patient Outreach (Signed)
  Care Coordination   Initial Visit Note   12/12/2021 Name: Katelyn Cooper MRN: [id number] DOB: 1955/09/23  Katelyn Cooper is a 66 y.o. year old female who sees Katelyn Cooper, Katelyn Peri, Katelyn Cooper for primary care. I spoke with  Katelyn Cooper by phone today.  What matters to the patients health and wellness today?  Maintain health and well being especially heart health    Goals Addressed             This Visit's Progress    COMPLETED: RNCM: Effective Management of HF       Care Coordination Interventions:  Basic overview and discussion of pathophysiology of Heart Failure reviewed Provided education on low sodium diet. Education and support given Reviewed role of diuretics in prevention of fluid overload and management of heart failure; Discussed the importance of keeping all appointments with provider Provided patient with education about the role of exercise in the management of heart failure Referral made to community resources care guide team for assistance with any resources the patient may need for future needs. Education and support given; Advised patient to discuss changes in her HF, heart health, or changes in her other chronic conditions with provider Screening for signs and symptoms of depression related to chronic disease state  Assessed social determinant of health barriers  Review of the care coordination program and how to reach the [facility]. The patient is working with the cardiologist right now and denies any concerns at this time. States she is doing well since recovering from her surgery and being out of the hospital. Review of how to reach the [facility] for new concerns or needs Had a new ECHO on 12-03-2021 and EF was 55 to 60%. Review of EF% and what the % meant. Education and support          SDOH assessments and interventions completed:  Yes  SDOH Interventions Today    Flowsheet Row Most Recent Value  SDOH Interventions   Food Insecurity Interventions Intervention Not  Indicated  Housing Interventions Intervention Not Indicated  Transportation Interventions Intervention Not Indicated  Utilities Interventions Intervention Not Indicated  Financial Strain Interventions Intervention Not Indicated  Physical Activity Interventions Other (Comments)  [the patient is going to start walking with her neighbor and goal is to do 3 days a week, 3 miles each time]  Stress Interventions Intervention Not Indicated        Care Coordination Interventions Activated:  Yes  Care Coordination Interventions:  Yes, provided   Follow up plan: No further intervention required.   Encounter Outcome:  Pt. Visit Completed   Noreene Larsson RN, MSN, [place] [facility] Mobile: [phone]

## 2021-12-18 ENCOUNTER — Telehealth: Payer: Self-pay

## 2021-12-18 DIAGNOSIS — G4700 Insomnia, unspecified: Secondary | ICD-10-CM

## 2021-12-18 DIAGNOSIS — F3341 Major depressive disorder, recurrent, in partial remission: Secondary | ICD-10-CM

## 2021-12-18 MED ORDER — QUETIAPINE FUMARATE 100 MG PO TABS: 100.0000 mg | ORAL_TABLET | Freq: Every day | ORAL | 0 refills | Status: DC

## 2021-12-18 MED ORDER — QUETIAPINE FUMARATE 100 MG PO TABS
100.0000 mg | ORAL_TABLET | Freq: Every day | ORAL | 0 refills | Status: DC
Start: 2021-12-18 — End: 2022-03-07

## 2021-12-18 NOTE — Telephone Encounter (Signed)
I have sent a limited supply of Seroquel to [facility] on [address].  I have also sent a full supply of Seroquel 100 mg to [facility].  Will route this message to Dr. Modesta Messing so she is aware.  Patient aware.

## 2021-12-18 NOTE — Telephone Encounter (Signed)
[facility] lost the seroquel in the mail and she is completely out.  she states she has a copy of the chat message when she called to track the prescription.  but that when she ask them to fill they stated that they would need a new rx.   She asked [facility] [address] because [facility].

## 2021-12-23 DIAGNOSIS — I447 Left bundle-branch block, unspecified: Secondary | ICD-10-CM | POA: Diagnosis not present

## 2021-12-23 DIAGNOSIS — R42 Dizziness and giddiness: Secondary | ICD-10-CM | POA: Diagnosis not present

## 2021-12-27 ENCOUNTER — Telehealth: Payer: Self-pay | Admitting: *Deleted

## 2021-12-27 DIAGNOSIS — G4733 Obstructive sleep apnea (adult) (pediatric): Secondary | ICD-10-CM | POA: Diagnosis not present

## 2021-12-27 NOTE — Telephone Encounter (Signed)
Reviewed results with patient and she verbalized understanding with no further questions at this time.  

## 2021-12-27 NOTE — Telephone Encounter (Signed)
-----   Message from Rise Mu, PA-C sent at 12/27/2021  9:21 AM EDT ----- Heart monitor showed a predominant rhythm of sinus with an average rate of 85 bpm (range 66 to 132 bpm), single atrial run (fast heartbeats coming from the top portion of the heart, occurred lasting just 5 beats, rare premature beats from the top and bottom portions of the heart.  Overall reassuring monitor without significant arrhythmia.

## 2021-12-29 DIAGNOSIS — M5412 Radiculopathy, cervical region: Secondary | ICD-10-CM | POA: Diagnosis not present

## 2021-12-30 ENCOUNTER — Other Ambulatory Visit: Payer: Self-pay | Admitting: Internal Medicine

## 2022-01-04 DIAGNOSIS — Z23 Encounter for immunization: Secondary | ICD-10-CM | POA: Diagnosis not present

## 2022-01-11 DIAGNOSIS — Z6824 Body mass index (BMI) 24.0-24.9, adult: Secondary | ICD-10-CM | POA: Diagnosis not present

## 2022-01-11 DIAGNOSIS — Z01419 Encounter for gynecological examination (general) (routine) without abnormal findings: Secondary | ICD-10-CM | POA: Diagnosis not present

## 2022-01-11 DIAGNOSIS — Z124 Encounter for screening for malignant neoplasm of cervix: Secondary | ICD-10-CM | POA: Diagnosis not present

## 2022-01-11 DIAGNOSIS — Z1231 Encounter for screening mammogram for malignant neoplasm of breast: Secondary | ICD-10-CM | POA: Diagnosis not present

## 2022-01-11 DIAGNOSIS — M8580 Other specified disorders of bone density and structure, unspecified site: Secondary | ICD-10-CM | POA: Diagnosis not present

## 2022-01-11 LAB — HM MAMMOGRAPHY

## 2022-01-14 ENCOUNTER — Encounter: Payer: Self-pay | Admitting: Family Medicine

## 2022-01-15 ENCOUNTER — Other Ambulatory Visit: Payer: Self-pay | Admitting: Obstetrics and Gynecology

## 2022-01-15 DIAGNOSIS — M8580 Other specified disorders of bone density and structure, unspecified site: Secondary | ICD-10-CM

## 2022-01-18 ENCOUNTER — Encounter: Payer: Self-pay | Admitting: Dermatology

## 2022-01-18 ENCOUNTER — Ambulatory Visit (INDEPENDENT_AMBULATORY_CARE_PROVIDER_SITE_OTHER): Payer: BC Managed Care – PPO | Admitting: Dermatology

## 2022-01-18 DIAGNOSIS — L570 Actinic keratosis: Secondary | ICD-10-CM | POA: Diagnosis not present

## 2022-01-18 DIAGNOSIS — Z85828 Personal history of other malignant neoplasm of skin: Secondary | ICD-10-CM | POA: Diagnosis not present

## 2022-01-18 DIAGNOSIS — L821 Other seborrheic keratosis: Secondary | ICD-10-CM | POA: Diagnosis not present

## 2022-01-18 DIAGNOSIS — L918 Other hypertrophic disorders of the skin: Secondary | ICD-10-CM

## 2022-01-18 DIAGNOSIS — Z1283 Encounter for screening for malignant neoplasm of skin: Secondary | ICD-10-CM

## 2022-01-18 DIAGNOSIS — Q825 Congenital non-neoplastic nevus: Secondary | ICD-10-CM | POA: Diagnosis not present

## 2022-01-18 DIAGNOSIS — L814 Other melanin hyperpigmentation: Secondary | ICD-10-CM

## 2022-01-18 DIAGNOSIS — D692 Other nonthrombocytopenic purpura: Secondary | ICD-10-CM

## 2022-01-18 DIAGNOSIS — L578 Other skin changes due to chronic exposure to nonionizing radiation: Secondary | ICD-10-CM

## 2022-01-18 DIAGNOSIS — D229 Melanocytic nevi, unspecified: Secondary | ICD-10-CM

## 2022-01-18 DIAGNOSIS — L820 Inflamed seborrheic keratosis: Secondary | ICD-10-CM

## 2022-01-18 NOTE — Progress Notes (Signed)
Follow-Up Visit   Subjective  Katelyn Cooper is a 66 y.o. female who presents for the following: Annual Exam (Hx BCC). The patient presents for Total-Body Skin Exam (TBSE) for skin cancer screening and mole check.  The patient has spots, moles and lesions to be evaluated, some may be new or changing and the patient has concerns that these could be cancer.  The following portions of the chart were reviewed this encounter and updated as appropriate:   Tobacco  Allergies  Meds  Problems  Med Hx  Surg Hx  Fam Hx     Review of Systems:  No other skin or systemic complaints except as noted in HPI or Assessment and Plan.  Objective  Well appearing patient in no apparent distress; mood and affect are within normal limits.  A full examination was performed including scalp, head, eyes, ears, nose, lips, neck, chest, axillae, abdomen, back, buttocks, bilateral upper extremities, bilateral lower extremities, hands, feet, fingers, toes, fingernails, and toenails. All findings within normal limits unless otherwise noted below.  L preauricular x 2 (2) Erythematous stuck-on, waxy papule or plaque  R forehead/hairline x 1 Erythematous thin papules/macules with gritty scale.   R costal area/inframammary Regular brown macule.   Assessment & Plan  Inflamed seborrheic keratosis (2) L preauricular x 2 Symptomatic, irritating, patient would like treated. Destruction of lesion - L preauricular x 2 Complexity: simple   Destruction method: cryotherapy   Informed consent: discussed and consent obtained   Timeout:  patient name, date of birth, surgical site, and procedure verified Lesion destroyed using liquid nitrogen: Yes   Region frozen until ice ball extended beyond lesion: Yes   Outcome: patient tolerated procedure well with no complications   Post-procedure details: wound care instructions given    AK (actinic keratosis) R forehead/hairline x 1 Destruction of lesion - R  forehead/hairline x 1 Complexity: simple   Destruction method: cryotherapy   Informed consent: discussed and consent obtained   Timeout:  patient name, date of birth, surgical site, and procedure verified Lesion destroyed using liquid nitrogen: Yes   Region frozen until ice ball extended beyond lesion: Yes   Outcome: patient tolerated procedure well with no complications   Post-procedure details: wound care instructions given    Congenital nevus R costal area/inframammary Benign-appearing.  Observation.  Call clinic for new or changing lesions.    Lentigines - Scattered tan macules - Due to sun exposure - Benign-appearing, observe - Recommend daily broad spectrum sunscreen SPF 30+ to sun-exposed areas, reapply every 2 hours as needed. - Call for any changes  Seborrheic Keratoses - Stuck-on, waxy, tan-brown papules and/or plaques  - Benign-appearing - Discussed benign etiology and prognosis. - Observe - Call for any changes  Melanocytic Nevi - Tan-brown and/or pink-flesh-colored symmetric macules and papules - Benign appearing on exam today - Observation - Call clinic for new or changing moles - Recommend daily use of broad spectrum spf 30+ sunscreen to sun-exposed areas.   Hemangiomas - Red papules - Discussed benign nature - Observe - Call for any changes  Actinic Damage - Chronic condition, secondary to cumulative UV/sun exposure - diffuse scaly erythematous macules with underlying dyspigmentation - Recommend daily broad spectrum sunscreen SPF 30+ to sun-exposed areas, reapply every 2 hours as needed.  - Staying in the shade or wearing long sleeves, sun glasses (UVA+UVB protection) and wide brim hats (4-inch brim around the entire circumference of the hat) are also recommended for sun protection.  - Call for new  or changing lesions.  History of Basal Cell Carcinoma of the Skin - No evidence of recurrence today - Recommend regular full body skin exams - Recommend  daily broad spectrum sunscreen SPF 30+ to sun-exposed areas, reapply every 2 hours as needed.  - Call if any new or changing lesions are noted between office visits  Acrochordons (Skin Tags) - Fleshy, skin-colored pedunculated papules - Benign appearing.  - Observe. - If desired, they can be removed with an in office procedure that is not covered by insurance. - Please call the clinic if you notice any new or changing lesions.  Purpura - Chronic; persistent and recurrent.  Treatable, but not curable. - Violaceous macules and patches - Benign - Related to trauma, age, sun damage and/or use of blood thinners, chronic use of topical and/or oral steroids - Observe - Can use OTC arnica containing moisturizer such as Dermend Bruise Formula if desired - Call for worsening or other concerns  Skin cancer screening performed today.  Return in about 1 year (around 01/19/2023) for TBSE.  Luther Redo, CMA, am acting as scribe for Sarina Ser, MD . Documentation: I have reviewed the above documentation for accuracy and completeness, and I agree with the above.  Sarina Ser, MD

## 2022-01-18 NOTE — Patient Instructions (Signed)
Due to recent changes in healthcare laws, you may see results of your pathology and/or laboratory studies on MyChart before the doctors have had a chance to review them. We understand that in some cases there may be results that are confusing or concerning to you. Please understand that not all results are received at the same time and often the doctors may need to interpret multiple results in order to provide you with the best plan of care or course of treatment. Therefore, we ask that you please give us 2 business days to thoroughly review all your results before contacting the office for clarification. Should we see a critical lab result, you will be contacted sooner.   If You Need Anything After Your Visit  If you have any questions or concerns for your doctor, please call our main line at [phone] and press option 4 to reach your doctor's medical assistant. If no one answers, please leave a voicemail as directed and we will return your call as soon as possible. Messages left after 4 pm will be answered the following business day.   You may also send us a message via MyChart. We typically respond to MyChart messages within 1-2 business days.  For prescription refills, please ask your pharmacy to contact our office. Our fax number is [phone].  If you have an urgent issue when the clinic is closed that cannot wait until the next business day, you can page your doctor at the number below.    Please note that while we do our best to be available for urgent issues outside of office hours, we are not available 24/7.   If you have an urgent issue and are unable to reach us, you may choose to seek medical care at your doctor's office, retail clinic, urgent care center, or emergency room.  If you have a medical emergency, please immediately call 911 or go to the emergency department.  Pager Numbers  - Dr. Kowalski: [phone]  - Dr. Moye: [phone]  - Dr. Stewart:  [phone]  In the event of inclement weather, please call our main line at [phone] for an update on the status of any delays or closures.  Dermatology Medication Tips: Please keep the boxes that topical medications come in in order to help keep track of the instructions about where and how to use these. Pharmacies typically print the medication instructions only on the boxes and not directly on the medication tubes.   If your medication is too expensive, please contact our office at [phone] option 4 or send us a message through MyChart.   We are unable to tell what your co-pay for medications will be in advance as this is different depending on your insurance coverage. However, we may be able to find a substitute medication at lower cost or fill out paperwork to get insurance to cover a needed medication.   If a prior authorization is required to get your medication covered by your insurance company, please allow us 1-2 business days to complete this process.  Drug prices often vary depending on where the prescription is filled and some pharmacies may offer cheaper prices.  The website www.goodrx.com contains coupons for medications through different pharmacies. The prices here do not account for what the cost may be with help from insurance (it may be cheaper with your insurance), but the website can give you the price if you did not use any insurance.  - You can print the associated coupon and take it with   your prescription to the pharmacy.  - You may also stop by our office during regular business hours and pick up a GoodRx coupon card.  - If you need your prescription sent electronically to a different pharmacy, notify our office through [facility] MyChart or by phone at [phone] option 4.     Si Usted Necesita Algo Despus de Su Visita  Tambin puede enviarnos un mensaje a travs de MyChart. Por lo general respondemos a los mensajes de MyChart en el transcurso de 1 a 2  das hbiles.  Para renovar recetas, por favor pida a su farmacia que se ponga en contacto con nuestra oficina. Nuestro nmero de fax es el [phone].  Si tiene un asunto urgente cuando la clnica est cerrada y que no puede esperar hasta el siguiente da hbil, puede llamar/localizar a su doctor(a) al nmero que aparece a continuacin.   Por favor, tenga en cuenta que aunque hacemos todo lo posible para estar disponibles para asuntos urgentes fuera del horario de oficina, no estamos disponibles las 24 horas del da, los 7 das de la semana.   Si tiene un problema urgente y no puede comunicarse con nosotros, puede optar por buscar atencin mdica  en el consultorio de su doctor(a), en una clnica privada, en un centro de atencin urgente o en una sala de emergencias.  Si tiene una emergencia mdica, por favor llame inmediatamente al 911 o vaya a la sala de emergencias.  Nmeros de bper  - Dr. Kowalski: [phone]  - Dra. Moye: [phone]  - Dra. Stewart: [phone]  En caso de inclemencias del tiempo, por favor llame a nuestra lnea principal al [phone] para una actualizacin sobre el estado de cualquier retraso o cierre.  Consejos para la medicacin en dermatologa: Por favor, guarde las cajas en las que vienen los medicamentos de uso tpico para ayudarle a seguir las instrucciones sobre dnde y cmo usarlos. Las farmacias generalmente imprimen las instrucciones del medicamento slo en las cajas y no directamente en los tubos del medicamento.   Si su medicamento es muy caro, por favor, pngase en contacto con nuestra oficina llamando al [phone] y presione la opcin 4 o envenos un mensaje a travs de MyChart.   No podemos decirle cul ser su copago por los medicamentos por adelantado ya que esto es diferente dependiendo de la cobertura de su seguro. Sin embargo, es posible que podamos encontrar un medicamento sustituto a menor costo o llenar un formulario para que el  seguro cubra el medicamento que se considera necesario.   Si se requiere una autorizacin previa para que su compaa de seguros cubra su medicamento, por favor permtanos de 1 a 2 das hbiles para completar este proceso.  Los precios de los medicamentos varan con frecuencia dependiendo del lugar de dnde se surte la receta y alguna farmacias pueden ofrecer precios ms baratos.  El sitio web www.goodrx.com tiene cupones para medicamentos de diferentes farmacias. Los precios aqu no tienen en cuenta lo que podra costar con la ayuda del seguro (puede ser ms barato con su seguro), pero el sitio web puede darle el precio si no utiliz ningn seguro.  - Puede imprimir el cupn correspondiente y llevarlo con su receta a la farmacia.  - Tambin puede pasar por nuestra oficina durante el horario de atencin regular y recoger una tarjeta de cupones de GoodRx.  - Si necesita que su receta se enve electrnicamente a una farmacia diferente, informe a nuestra oficina a travs de MyChart de [place]   o por telfono llamando al [phone] y presione la opcin 4.  

## 2022-01-27 ENCOUNTER — Other Ambulatory Visit: Payer: Self-pay | Admitting: Family Medicine

## 2022-01-27 DIAGNOSIS — E119 Type 2 diabetes mellitus without complications: Secondary | ICD-10-CM

## 2022-01-28 ENCOUNTER — Encounter: Payer: Self-pay | Admitting: Dermatology

## 2022-01-29 ENCOUNTER — Ambulatory Visit: Payer: BC Managed Care – PPO | Admitting: Family Medicine

## 2022-01-30 ENCOUNTER — Encounter: Payer: Self-pay | Admitting: Family Medicine

## 2022-01-30 ENCOUNTER — Ambulatory Visit (INDEPENDENT_AMBULATORY_CARE_PROVIDER_SITE_OTHER): Payer: BC Managed Care – PPO | Admitting: Family Medicine

## 2022-01-30 VITALS — BP 92/57 | HR 88 | Resp 16 | Ht 63.0 in | Wt 144.0 lb

## 2022-01-30 DIAGNOSIS — J301 Allergic rhinitis due to pollen: Secondary | ICD-10-CM | POA: Diagnosis not present

## 2022-01-30 DIAGNOSIS — E119 Type 2 diabetes mellitus without complications: Secondary | ICD-10-CM | POA: Diagnosis not present

## 2022-01-30 DIAGNOSIS — R052 Subacute cough: Secondary | ICD-10-CM | POA: Diagnosis not present

## 2022-01-30 LAB — POCT GLYCOSYLATED HEMOGLOBIN (HGB A1C)
Est. average glucose Bld gHb Est-mCnc: 140
Hemoglobin A1C: 6.5 % — AB (ref 4.0–5.6)

## 2022-01-30 MED ORDER — FLUTICASONE PROPIONATE 50 MCG/ACT NA SUSP: 2.0000 | Freq: Every day | NASAL | 1 refills | Status: DC

## 2022-01-30 MED ORDER — MONTELUKAST SODIUM 10 MG PO TABS: 10.0000 mg | ORAL_TABLET | Freq: Every day | ORAL | 1 refills | Status: DC

## 2022-01-30 NOTE — Patient Instructions (Signed)
.   Please review the attached list of medications and notify my office if there are any errors.   . Please bring all of your medications to every appointment so we can make sure that our medication list is the same as yours.   

## 2022-01-30 NOTE — Progress Notes (Signed)
I,Tiffany J Bragg,acting as a scribe for Lelon Huh, MD.,have documented all relevant documentation on the behalf of Lelon Huh, MD,as directed by  Lelon Huh, MD while in the presence of Lelon Huh, MD.   Established patient visit   Patient: Katelyn Cooper   DOB: 10-01-1955   66 y.o. Female  MRN: [id number] Visit Date: 01/30/2022  Today's healthcare provider: Lelon Huh, MD    Subjective    HPI  Patient complains of productive cough, SOB for 12 days. Says she fainted one day, falling and hurting her L elbow, hip and ribs. No chest pains or fever. Having some mild sinus congestion and drainage. Tired some guaifenesin with minimal improvement.    Diabetes Mellitus Type II, follow-up  Lab Results  Component Value Date   HGBA1C 6.5 (A) 01/30/2022   HGBA1C 6.6 (A) 04/24/2021   HGBA1C 6.3 (A) 10/17/2020   Last seen for diabetes 3 months ago.  Management since then includes continuing the same treatment. She reports excellent compliance with treatment. She is not having side effects.   Home blood sugar records:  not checked  Episodes of hypoglycemia? No    Current insulin regiment: NA Most Recent Eye Exam: done this year  --------------------------------------------------------------------------------------------------- Hypertension, follow-up  BP Readings from Last 3 Encounters:  01/30/22 (!) 92/57  10/31/21 (!) 106/44  10/27/21 117/61   Wt Readings from Last 3 Encounters:  01/30/22 144 lb (65.3 kg)  10/31/21 139 lb 8 oz (63.3 kg)  10/27/21 139 lb 1.6 oz (63.1 kg)     She was last seen for hypertension 3 months ago.  BP at that visit was 117/61. Management since that visit includes continue medications. She reports excellent compliance with treatment. She is not having side effects.  She is exercising. She is adherent to low salt diet.   Outside blood pressures are not checked.  She does smoke.  Use of agents associated with hypertension:  none.   --------------------------------------------------------------------------------------------------- Lipid/Cholesterol, follow-up  Last Lipid Panel: Lab Results  Component Value Date   CHOL 142 05/01/2021   LDLCALC 75 05/01/2021   HDL 38 (L) 05/01/2021   TRIG 172 (H) 05/01/2021    She was last seen for this 3 months ago.  Management since that visit includes continue medications.  She reports excellent compliance with treatment. She is not having side effects.   Symptoms: No appetite changes No foot ulcerations  No chest pain No chest pressure/discomfort  Yes dyspnea No orthopnea  No fatigue No lower extremity edema  No palpitations Yes paroxysmal nocturnal dyspnea  No nausea No numbness or tingling of extremity  No polydipsia No polyuria  No speech difficulty Yes syncope   She is following a Low Sodium diet. Current exercise: walking  Last metabolic panel Lab Results  Component Value Date   GLUCOSE 129 (H) 10/31/2021   NA 142 10/31/2021   K 4.9 10/31/2021   BUN 7 (L) 10/31/2021   CREATININE 0.76 10/31/2021   EGFR 86 10/31/2021   GFRNONAA >60 10/19/2021   CALCIUM 9.7 10/31/2021   AST 27 10/19/2021   ALT 21 10/19/2021   The 10-year ASCVD risk score (Arnett DK, et al., 2019) is: 15.3%  ---------------------------------------------------------------------------------------------------   Medications: Outpatient Medications Prior to Visit  Medication Sig   acetaminophen (TYLENOL) 500 MG tablet Take 500 mg by mouth every 6 (six) hours as needed.   albuterol (VENTOLIN HFA) 108 (90 Base) MCG/ACT inhaler Ventolin HFA 90 mcg/actuation aerosol inhaler  inhale 2 puffs by  mouth every 6 hours if needed for wheezing or shortness of breath   aspirin EC 81 MG tablet Take 1 tablet (81 mg total) by mouth daily.   atorvastatin (LIPITOR) 80 MG tablet Take 1 tablet by mouth daily.   b complex vitamins capsule Take 1 capsule by mouth daily.   Cholecalciferol (VITAMIN D3)  5000 units TABS Take 1 tablet by mouth daily.    docusate sodium (COLACE) 100 MG capsule Take 100 mg by mouth daily as needed.   furosemide (LASIX) 20 MG tablet Take 1 tablet by mouth daily as needed for increased fluid or weight gain.   gabapentin (NEURONTIN) 300 MG capsule Take 300 mg by mouth 3 (three) times daily.   glucose blood test strip ONETOUCH ULTRA MINI STRIPS AND LANCETS. Use one daily to check blood sugar.   lansoprazole (PREVACID) 15 MG capsule Take 15 mg by mouth daily at 6 (six) AM.   metFORMIN (GLUCOPHAGE-XR) 500 MG 24 hr tablet Take 1 tablet by mouth every evening. **Generic equivalent for Glucophage-XR.**   methocarbamol (ROBAXIN) 500 MG tablet Take 500 mg by mouth 2 (two) times daily.   metoprolol tartrate (LOPRESSOR) 25 MG tablet Take 1 tablet (25 mg total) by mouth 2 (two) times daily.   Multiple Vitamins-Minerals (MULTI-VITAMIN GUMMIES PO) Take by mouth. Taking 1 daily   ondansetron (ZOFRAN-ODT) 4 MG disintegrating tablet 4 mg 2 (two) times daily as needed.   oxyCODONE (OXY IR/ROXICODONE) 5 MG immediate release tablet Take 5 mg by mouth every 4 (four) hours as needed.   potassium chloride SA (KLOR-CON M20) 20 MEQ tablet Take 2 tablets (40 meq) by mouth once daily only on days you are taking lasix   QUEtiapine (SEROQUEL) 100 MG tablet Take 1 tablet (100 mg total) by mouth at bedtime. Limited supply   QUEtiapine (SEROQUEL) 100 MG tablet Take 1 tablet (100 mg total) by mouth at bedtime.   traMADol (ULTRAM) 50 MG tablet Take 50 mg by mouth every 6 (six) hours.   venlafaxine XR (EFFEXOR-XR) 75 MG 24 hr capsule Take 1 capsule (75 mg total) by mouth daily with breakfast.   zaleplon (SONATA) 10 MG capsule Take 1 capsule (10 mg total) by mouth at bedtime as needed for sleep.   No facility-administered medications prior to visit.    Review of Systems  Constitutional:  Negative for appetite change, chills, fatigue and fever.  Respiratory:  Positive for cough and shortness of  breath. Negative for apnea, chest tightness and wheezing.   Cardiovascular:  Negative for chest pain and palpitations.  Gastrointestinal:  Negative for abdominal pain, nausea and vomiting.  Neurological:  Negative for dizziness and weakness.       Objective    BP (!) 92/57 (BP Location: Right Arm, Patient Position: Sitting, Cuff Size: Normal)   Pulse 88   Resp 16   Ht _0  (1.6 m)   Wt 144 lb (65.3 kg)   SpO2 95%   BMI 25.51 kg/m    Physical Exam  General Appearance:    Well developed, well nourished female, alert, cooperative, in no acute distress  HENT:   bilateral TM normal without fluid or infection, neck without nodes, sinuses nontender, post nasal drip noted, and nasal mucosa pale and congested  Eyes:    PERRL, conjunctiva/corneas clear, EOM's intact       Lungs:     Clear to auscultation bilaterally, respirations unlabored  Heart:    Normal heart rate. Normal rhythm. No murmurs, rubs, or gallops.  Neurologic:   Awake, alert, oriented x 3. No apparent focal neurological           defect.        Results for orders placed or performed in visit on 01/30/22  POCT glycosylated hemoglobin (Hb A1C)  Result Value Ref Range   Hemoglobin A1C 6.5 (A) 4.0 - 5.6 %   Est. average glucose Bld gHb Est-mCnc 140     Assessment & Plan     1. Controlled type 2 diabetes mellitus without complication, without long-term current use of insulin ([place]) Well controlled.  Continue current medications.    2. Subacute cough Normal lung exam. Suspect this is secondary to post nasal drainage. Smoking cessation encouraged. She does have pulmonary follow up scheduled in December.   3. Allergic rhinitis due to pollen, unspecified seasonality  - fluticasone (FLONASE) 50 MCG/ACT nasal spray; Place 2 sprays into both nostrils daily.  Dispense: 16 g; Refill: 1 - montelukast (SINGULAIR) 10 MG tablet; Take 1 tablet (10 mg total) by mouth at bedtime.  Dispense: 30 tablet; Refill: 1      The entirety  of the information documented in the History of Present Illness, Review of Systems and Physical Exam were personally obtained by me. Portions of this information were initially documented by the CMA and reviewed by me for thoroughness and accuracy.     Lelon Huh, MD  [facility] [phone] (phone) [phone] (fax)  [place]

## 2022-02-19 ENCOUNTER — Other Ambulatory Visit: Payer: Self-pay | Admitting: Physician Assistant

## 2022-02-28 ENCOUNTER — Other Ambulatory Visit: Payer: Self-pay | Admitting: Psychiatry

## 2022-02-28 DIAGNOSIS — F3341 Major depressive disorder, recurrent, in partial remission: Secondary | ICD-10-CM

## 2022-02-28 DIAGNOSIS — G4700 Insomnia, unspecified: Secondary | ICD-10-CM

## 2022-02-28 NOTE — Telephone Encounter (Signed)
Defer to [place].

## 2022-03-01 ENCOUNTER — Telehealth: Payer: Self-pay | Admitting: Internal Medicine

## 2022-03-01 MED ORDER — POTASSIUM CHLORIDE CRYS ER 20 MEQ PO TBCR: EXTENDED_RELEASE_TABLET | ORAL | 0 refills | Status: DC

## 2022-03-01 NOTE — Telephone Encounter (Signed)
*  STAT* If patient is at the pharmacy, call can be transferred to refill team.   1. Which medications need to be refilled? (please list name of each medication and dose if known)   potassium chloride SA (KLOR-CON M20) 20 MEQ tablet   2. Which pharmacy/location (including street and city if local pharmacy) is medication to be sent to?  [place], [address]   3. Do they need a 30 day or 90 day supply? 90 day  Patient stated she is completely out of this medication.

## 2022-03-02 DIAGNOSIS — M5412 Radiculopathy, cervical region: Secondary | ICD-10-CM | POA: Diagnosis not present

## 2022-03-05 NOTE — Progress Notes (Unsigned)
Virtual Visit via Video Note  I connected with Katelyn Cooper on 03/07/22 at  8:40 AM EST by a video enabled telemedicine application and verified that I am speaking with the correct person using two identifiers.  Location: Patient: home Provider: office Persons participated in the visit- patient, provider    I discussed the limitations of evaluation and management by telemedicine and the availability of in person appointments. The patient expressed understanding and agreed to proceed.    I discussed the assessment and treatment plan with the patient. The patient was provided an opportunity to ask questions and all were answered. The patient agreed with the plan and demonstrated an understanding of the instructions.   The patient was advised to call back or seek an in-person evaluation if the symptoms worsen or if the condition fails to improve as anticipated.  I provided 14 minutes of non-face-to-face time during this encounter.   [name], MD    [facility] MD/PA/NP OP Progress Note  03/07/2022 9:05 AM Katelyn Cooper  MRN:  [id number]  Chief Complaint:  Chief Complaint  Patient presents with   Depression   Follow-up   HPI:  This is a follow-up appointment for depression and insomnia.  She states that she has been doing good.  She feels normal.  She stayed at home on Thanksgiving.  Her grandson and his fiance visit her after Thanksgiving.  She enjoyed the time with them.  She has started to take a walk with her friend again.  She enjoys this time, and denies having any fall since the last visit.  She sleeps well with Sonata.  She does not take a nap.  She denies feeling depressed.  She tends to be worried about "any kinds of things" including when her kids are coming, or their driving, although she knows that they are grown ups.  However, she does not think she needs to be a therapist at this time as it is not severe.  She denies change in appetite or weight.  She denies SI. She  denies irritability anymore, and feels leveled. She denies alcohol use or drug use.    Wt Readings from Last 3 Encounters:  01/30/22 144 lb (65.3 kg)  10/31/21 139 lb 8 oz (63.3 kg)  10/27/21 139 lb 1.6 oz (63.1 kg)     Exercise: walk 2 miles, 3 days a week with her friend Employment: retired/Used to work for [employer] in 2018, work 3 days a week at [employer].  Support: husband Household: husband Marital status: married in 1996. Married twice Number of children: 0. 1 step son from previous marriage (she raised him. 66 yo in 2022) Last PCP / ongoing medical evaluation:  due next month for annual visit Visit Diagnosis:    ICD-10-CM   1. MDD (major depressive disorder), recurrent, in partial remission (HCC)  F33.41 QUEtiapine (SEROQUEL) 100 MG tablet    venlafaxine XR (EFFEXOR-XR) 75 MG 24 hr capsule    2. Insomnia, unspecified type  G47.00 QUEtiapine (SEROQUEL) 100 MG tablet      Past Psychiatric History: Please see initial evaluation for full details. I have reviewed the history. No updates at this time.     Past Medical History:  Past Medical History:  Diagnosis Date   (HFpEF) heart failure with preserved ejection fraction ([place])    a. 12/2015 Echo: EF 55-60%, nl RV size/fxn, no significant valvular abnormalities; b. 10/2017 Echo: EF 50-55%, antsept, ant HK. Gr2 DD. Mild MR. Nl RV fxn. PASP  93mHg.   Anxiety    Basal cell carcinoma 08/08/2017   L nose above mid nasal alar groove   CHF (congestive heart failure) (HCC)    Depression    Diabetes type 2, controlled ([facility]    diet-controlled   Esophageal stricture    GERD (gastroesophageal reflux disease)    Hiatal hernia    History of chicken pox    Hyperlipidemia    Hypertension    Internal hemorrhoids    Non-obstructive CAD (coronary artery disease)    a. 12/2015 MV: apical ant and apical reversible defect. EF 55-65%; b. 01/2016 Cath: LM nl, LAD nl, SP1 40ost, LCX nl, OM1 30, RCA 30p/m, EDP 15-260mg; c. 10/2017 Cath: LM  nl, LAD nl, SP1 30ost, LCX nl, OM1 30, RCA 30p/m. EF 55%.   Obstructive sleep apnea    PAD (peripheral artery disease) ([facility]   a. 1990's s/p prior LCIA stenting; b. 12/2015 ABI: R 1.05, L 1.03.   Panic disorder     Past Surgical History:  Procedure Laterality Date   AORTA SURGERY  1999   stent placement; ? left iliac artery intervention   APPENDECTOMY  1998   BASAL CELL CARCINOMA EXCISION     C5 - fusion N/A 10/10/2021   CARDIAC CATHETERIZATION N/A 01/12/2016   Procedure: Left Heart Cath and Coronary Angiography;  Surgeon: ChNelva BushMD;  [facility];  Service: Cardiovascular;  Laterality: N/A;   COLONOSCOPY  07/17/2019   FOOT SURGERY     Right   pap smear  03/2010   Done by Dr. AnEverett Graffnormal   REFRACTIVE SURGERY     right   RIB RESECTION  [id number] RIGHT/LEFT HEART CATH AND CORONARY ANGIOGRAPHY N/A 11/08/2017   Procedure: RIGHT/LEFT HEART CATH AND CORONARY ANGIOGRAPHY;  Surgeon: ArWellington HampshireMD;  [facility];  Service: Cardiovascular;  Laterality: N/A;   SHOULDER SURGERY  2005   left   UPPER GASTROINTESTINAL ENDOSCOPY  07/17/2019   UPPER GI ENDOSCOPY  09/19/2011   Stricture at GE junction, dilated. Mild gastritis and duodenitis. Small hiatal hernia    Family Psychiatric History: Please see initial evaluation for full details. I have reviewed the history. No updates at this time.     Family History:  Family History  Problem Relation Age of Onset   Lung cancer Mother        was a smoker   Emphysema Mother    Cancer Mother        Lung   Alcohol abuse Mother    Depression Mother    Heart disease Mother    Drug abuse Sister    Breast cancer Sister        x 2  (30&50)   Emphysema Sister    Bipolar disorder Sister    Alcohol abuse Other    Depression Other    Bipolar disorder Other    Heart disease Other    Vision loss Other    Alcohol abuse Father    Mood Disorder Father    Heart disease Maternal  Grandmother    Stroke Maternal Grandmother    Esophageal cancer Maternal Aunt    Colon cancer Neg Hx    Colon polyps Neg Hx    Rectal cancer Neg Hx    Stomach cancer Neg Hx     Social History:  Social History   Socioeconomic History   Marital status: Married    Spouse name: mark   Number  of children: 0   Years of education: Not on file   Highest education level: High school graduate  Occupational History   Occupation: computer-retired    Employer: [employer]  Tobacco Use   Smoking status: Every Day    Packs/day: 1.00    Years: 40.00    Total pack years: 40.00    Types: Cigarettes    Start date: 03/01/1975   Smokeless tobacco: Never   Tobacco comments:    0.75PPD 10/11/2020  Vaping Use   Vaping Use: Former  Substance and Sexual Activity   Alcohol use: No    Alcohol/week: 0.0 standard drinks of alcohol   Drug use: No   Sexual activity: Not Currently  Other Topics Concern   Not on file  Social History Narrative   Not on file   Social Determinants of Health   Financial Resource Strain: Low Risk  (12/12/2021)   Overall Financial Resource Strain (CARDIA)    Difficulty of Paying Living Expenses: Not hard at all  Food Insecurity: No Food Insecurity (12/12/2021)   Hunger Vital Sign    Worried About Running Out of Food in the Last Year: Never true    Ran Out of Food in the Last Year: Never true  Transportation Needs: No Transportation Needs (12/12/2021)   PRAPARE - Hydrologist (Medical): No    Lack of Transportation (Non-Medical): No  Physical Activity: Inactive (12/12/2021)   Exercise Vital Sign    Days of Exercise per Week: 0 days    Minutes of Exercise per Session: 0 min  Stress: No Stress Concern Present (12/12/2021)   [place]    Feeling of Stress : Only a little  Social Connections: Moderately Isolated (03/04/2017)   Social Connection and Isolation Panel [NHANES]     Frequency of Communication with Friends and Family: Once a week    Frequency of Social Gatherings with Friends and Family: Never    Attends Religious Services: Never    Marine scientist or Organizations: No    Attends Archivist Meetings: Never    Marital Status: Married    Allergies:  Allergies  Allergen Reactions   Lovastatin     depression   Paroxetine Hcl     itching   Lamotrigine Rash    Metabolic Disorder Labs: Lab Results  Component Value Date   HGBA1C 6.5 (A) 01/30/2022   No results found for: "PROLACTIN" Lab Results  Component Value Date   CHOL 142 05/01/2021   TRIG 172 (H) 05/01/2021   HDL 38 (L) 05/01/2021   CHOLHDL 3.7 05/01/2021   VLDL 41 (H) 03/14/2020   [place] 75 05/01/2021   LDLCALC 53 03/14/2020   Lab Results  Component Value Date   TSH 0.934 10/18/2021   TSH 1.660 06/02/2020    Therapeutic Level Labs: No results found for: "LITHIUM" No results found for: "VALPROATE" No results found for: "CBMZ"  Current Medications: Current Outpatient Medications  Medication Sig Dispense Refill   zaleplon (SONATA) 5 MG capsule Take 1 capsule (5 mg total) by mouth at bedtime as needed for sleep. 30 capsule 2   acetaminophen (TYLENOL) 500 MG tablet Take 500 mg by mouth every 6 (six) hours as needed.     albuterol (VENTOLIN HFA) 108 (90 Base) MCG/ACT inhaler Ventolin HFA 90 mcg/actuation aerosol inhaler  inhale 2 puffs by mouth every 6 hours if needed for wheezing or shortness of breath  aspirin EC 81 MG tablet Take 1 tablet (81 mg total) by mouth daily.     atorvastatin (LIPITOR) 80 MG tablet Take 1 tablet by mouth daily. 90 tablet 0   b complex vitamins capsule Take 1 capsule by mouth daily.     Cholecalciferol (VITAMIN D3) 5000 units TABS Take 1 tablet by mouth daily.   0   docusate sodium (COLACE) 100 MG capsule Take 100 mg by mouth daily as needed.     fluticasone (FLONASE) 50 MCG/ACT nasal spray Place 2 sprays into both nostrils daily.  16 g 1   furosemide (LASIX) 20 MG tablet Take 1 tablet by mouth daily as needed for increased fluid or weight gain. 90 tablet 0   gabapentin (NEURONTIN) 300 MG capsule Take 300 mg by mouth 3 (three) times daily.     glucose blood test strip ONETOUCH ULTRA MINI STRIPS AND LANCETS. Use one daily to check blood sugar. 100 each 3   lansoprazole (PREVACID) 15 MG capsule Take 15 mg by mouth daily at 6 (six) AM.     metFORMIN (GLUCOPHAGE-XR) 500 MG 24 hr tablet Take 1 tablet by mouth every evening. **Generic equivalent for Glucophage-XR.** 90 tablet 2   methocarbamol (ROBAXIN) 500 MG tablet Take 500 mg by mouth 2 (two) times daily.     metoprolol tartrate (LOPRESSOR) 25 MG tablet Take 1 tablet (25 mg total) by mouth 2 (two) times daily. 180 tablet 0   montelukast (SINGULAIR) 10 MG tablet Take 1 tablet (10 mg total) by mouth at bedtime. 30 tablet 1   Multiple Vitamins-Minerals (MULTI-VITAMIN GUMMIES PO) Take by mouth. Taking 1 daily     ondansetron (ZOFRAN-ODT) 4 MG disintegrating tablet 4 mg 2 (two) times daily as needed.     oxyCODONE (OXY IR/ROXICODONE) 5 MG immediate release tablet Take 5 mg by mouth every 4 (four) hours as needed.     potassium chloride SA (KLOR-CON M20) 20 MEQ tablet Take 2 tablets (40 meq) by mouth once daily only on days you are taking lasix. Please call [phone] to schedule an appointment. 90 tablet 0   [START ON 03/19/2022] QUEtiapine (SEROQUEL) 100 MG tablet Take 1 tablet (100 mg total) by mouth at bedtime. 90 tablet 0   traMADol (ULTRAM) 50 MG tablet Take 50 mg by mouth every 6 (six) hours.     [START ON 04/04/2022] venlafaxine XR (EFFEXOR-XR) 75 MG 24 hr capsule Take 1 capsule (75 mg total) by mouth daily with breakfast. 90 capsule 0   No current facility-administered medications for this visit.     Musculoskeletal: Strength & Muscle Tone:  N/A Gait & Station:  N/A Patient leans: N/A  Psychiatric Specialty Exam: Review of Systems  Psychiatric/Behavioral:  Positive  for sleep disturbance. Negative for agitation, behavioral problems, confusion, decreased concentration, dysphoric mood, hallucinations, self-injury and suicidal ideas. The patient is nervous/anxious. The patient is not hyperactive.   All other systems reviewed and are negative.   There were no vitals taken for this visit.There is no height or weight on file to calculate BMI.  General Appearance: Fairly Groomed  Eye Contact:  Good  Speech:  Clear and Coherent  Volume:  Normal  Mood:   good  Affect:  Appropriate, Congruent, and Full Range  Thought Process:  Coherent  Orientation:  Full (Time, Place, and Person)  Thought Content: Logical   Suicidal Thoughts:  No  Homicidal Thoughts:  No  Memory:  Immediate;   Good  Judgement:  Good  Insight:  Good  Psychomotor Activity:  Normal  Concentration:  Concentration: Good and Attention Span: Good  Recall:  Good  Fund of Knowledge: Good  Language: Good  Akathisia:  NA  Handed:  Right  AIMS (if indicated): not done  Assets:  Communication Skills Desire for Improvement  ADL's:  Intact  Cognition: WNL  Sleep:  Good   Screenings: [place] Office Visit from 03/10/2018 in [place] Total Score 0      PHQ2-9    [place] Office Visit from 01/30/2022 in [place] from 12/12/2021 in [place] Visit from 08/10/2021 in [place] Visit from 04/24/2021 in [place] Video Visit from 03/14/2021 in [place]  PHQ-2 Total Score 0 0 0 0 0  PHQ-9 Total Score -- -- 0 1 --      Flowsheet Row ED to Hosp-Admission (Discharged) from 10/18/2021 in [place] Video Visit from 03/14/2021 in [place] Video Visit from 10/05/2020 in [place] No Risk No Risk No Risk        Assessment and Plan:  Jenisis Harmsen is a 66 y.o. year old female with a history of PTSD, depression, nonobstructive CAD, PAD status post left iliac stent, chronic HFpEF, diet-controlled DM2, thoracic outlet syndrome status post bilateral rib resections (8676), GERD complicated by esophageal stricture, HLD, who presents for follow up appointment for below.   1. MDD (major depressive disorder), recurrent, in partial remission ([place]) Although she continues to report occasional self limiting anxiety, there has been overall improvement in depression since the last visit. She reports good support from her husband, and maintains good connection with her friend.  Will continue current dose of venlafaxine and quetiapine to target depression.  Noted that she had worsening in irritability when trying to taper down quetiapine.  On another note, although she was diagnosed with bipolar 1 disorder in the past, she denies any significant manic symptoms except irritability.  Will continue to monitor.   2. Insomnia, unspecified type Improving.  She is willing to try lower dose of Sonata to avoid tolerance.  Discussed potential risk of drowsiness.    Plan Continue venlafaxine 75 mg daily Continue quetiapine 100 mg at night (on metformin) 420 msec 10/2021 Decrease Sonata 5 mg at night as needed for insomnia Next appointment - 2/28 at 9 AM, video - on Vagifem 10 mg, twice a week, inseritble - on Tramadol, gabapentin 300 mg tid,    Past trials of medication-  Paxil, mirtazapine, Latuda (insomnia), Abilify (worsening in anxiety, insomnia), Lamotrigine, Gabapentin   The patient demonstrates the following risk factors for suicide: Chronic risk factors for suicide include: psychiatric disorder of depression . Acute risk factors for suicide include: N/A. Protective factors for this patient include: positive social support, responsibility to others (children, family), coping  skills, and hope for the future. Considering these factors, the overall suicide risk at this point appears to be low. Patient is appropriate for outpatient follow up.       Collaboration of Care: Collaboration of Care: Other reviewed notes in Epic  Patient/Guardian was advised Release of Information must be obtained prior to any record release in order to collaborate their care with an outside provider. Patient/Guardian was advised if they have not already done so to contact the registration department to sign all necessary forms in order  for [name] to release information regarding their care.   Consent: Patient/Guardian gives verbal consent for treatment and assignment of benefits for services provided during this visit. Patient/Guardian expressed understanding and agreed to proceed.    [name], MD 03/07/2022, 9:05 AM

## 2022-03-07 ENCOUNTER — Encounter: Payer: Self-pay | Admitting: Psychiatry

## 2022-03-07 ENCOUNTER — Telehealth (INDEPENDENT_AMBULATORY_CARE_PROVIDER_SITE_OTHER): Payer: BC Managed Care – PPO | Admitting: Psychiatry

## 2022-03-07 DIAGNOSIS — F3341 Major depressive disorder, recurrent, in partial remission: Secondary | ICD-10-CM | POA: Diagnosis not present

## 2022-03-07 DIAGNOSIS — G4700 Insomnia, unspecified: Secondary | ICD-10-CM | POA: Diagnosis not present

## 2022-03-07 MED ORDER — VENLAFAXINE HCL ER 75 MG PO CP24: 75.0000 mg | ORAL_CAPSULE | Freq: Every day | ORAL | 0 refills | Status: DC

## 2022-03-07 MED ORDER — ZALEPLON 5 MG PO CAPS: 5.0000 mg | ORAL_CAPSULE | Freq: Every evening | ORAL | 2 refills | Status: DC | PRN

## 2022-03-07 MED ORDER — QUETIAPINE FUMARATE 100 MG PO TABS: 100.0000 mg | ORAL_TABLET | Freq: Every day | ORAL | 0 refills | Status: DC

## 2022-03-07 NOTE — Patient Instructions (Signed)
Continue venlafaxine 75 mg daily Continue quetiapine 100 mg at night  Decrease Sonata 5 mg at night as needed for insomnia Next appointment - 2/28 at 9 AM

## 2022-03-12 ENCOUNTER — Other Ambulatory Visit: Payer: Self-pay

## 2022-03-12 ENCOUNTER — Ambulatory Visit (INDEPENDENT_AMBULATORY_CARE_PROVIDER_SITE_OTHER): Payer: BC Managed Care – PPO | Admitting: Internal Medicine

## 2022-03-12 ENCOUNTER — Encounter: Payer: Self-pay | Admitting: Internal Medicine

## 2022-03-12 VITALS — BP 110/60 | HR 102 | Temp 97.7°F | Ht 63.0 in | Wt 144.4 lb

## 2022-03-12 DIAGNOSIS — J449 Chronic obstructive pulmonary disease, unspecified: Secondary | ICD-10-CM

## 2022-03-12 DIAGNOSIS — R911 Solitary pulmonary nodule: Secondary | ICD-10-CM | POA: Diagnosis not present

## 2022-03-12 DIAGNOSIS — Z720 Tobacco use: Secondary | ICD-10-CM

## 2022-03-12 DIAGNOSIS — G4733 Obstructive sleep apnea (adult) (pediatric): Secondary | ICD-10-CM | POA: Diagnosis not present

## 2022-03-12 DIAGNOSIS — F17210 Nicotine dependence, cigarettes, uncomplicated: Secondary | ICD-10-CM

## 2022-03-12 MED ORDER — METOPROLOL TARTRATE 25 MG PO TABS: 25.0000 mg | ORAL_TABLET | Freq: Two times a day (BID) | ORAL | 0 refills | Status: DC

## 2022-03-12 NOTE — Progress Notes (Signed)
[facility] Consultation      Date: 03/12/2022,   MRN# [id number] Bethzaida Boord [facility] Jul 28, 1955     Admission                  Current  Katelyn Cooper is a 66 y.o. old female seen in consultation for cough, pleuritic chest pain. Previous BQ patient   PFT FINDINGS PFT's 08/2015 reviewed with patient ratio 80% Fev1 2.2 L 87% FVC 2.6L 83%  CT chest 08/30/15 Left lung Nodule approx 6 MM increased from 2-3 MM from 2013.    CT chest 12/2015 Left Lung nodule approx 6 MM-no change from previous CT scans   CT chest 09/05/16 There is a right lower lobe small opacity probable mucus plugging mucoid impaction  CT chest 3.7.19 The left lower lobe nodule has not increased in size significantly from previous CAT scan  CT chest 8/19 Edema and effusions Left Nodule stable  findings reveiwed with patient  CT chest 05/2019 Stable 5 mm left lung nodule, consistent with benign perifissural lymph node. No active lung disease.  ECHO 8/19 left ventricular filling pattern, with   concomitant abnormal relaxation and increased filling pressure   (grade 2 diastolic dysfunction).    CC Follow up OSA Follow up COPD Follow-up CT chest  CC CT cheFCCollow-up OSA HPI  COPD stable at this time  No exacerbation at this time No evidence of heart failure at this time No evidence or signs of infection at this time No respiratory distress No fevers, chills, nausea, vomiting, diarrhea No evidence of lower extremity edema No evidence hemoptysis   OSA therapy is going well OSA is under control Previous reports showed excellent compliance Auto CPAP 5 to 20 cm water pressure  CT chest lung cancer screening follow-up February 2021 Stable 5 mm lung nodule Continue protocol as scheduled Patient restarted to use tobacco Has been smoking for the last 1 year Repeat CT chest in 1 year pending to assess lung nodule and high risk for cancer   Smoking Assessment and Cessation  Counseling Upon further questioning, Patient smokes 1 ppd I have advised patient to quit/stop smoking as soon as possible due to high risk for multiple medical problems  Patient is willing to quit smoking  I have advised patient that we can assist and have options of Nicotine replacement therapy. I also advised patient on behavioral therapy and can provide oral medication therapy in conjunction with the other therapies Follow up next Office visit  for assessment of smoking cessation Smoking cessation counseling advised for 4 minutes      Current Medication:   Current Outpatient Medications:    acetaminophen (TYLENOL) 500 MG tablet, Take 500 mg by mouth every 6 (six) hours as needed., Disp: , Rfl:    albuterol (VENTOLIN HFA) 108 (90 Base) MCG/ACT inhaler, Ventolin HFA 90 mcg/actuation aerosol inhaler  inhale 2 puffs by mouth every 6 hours if needed for wheezing or shortness of breath, Disp: , Rfl:    aspirin EC 81 MG tablet, Take 1 tablet (81 mg total) by mouth daily., Disp: , Rfl:    atorvastatin (LIPITOR) 80 MG tablet, Take 1 tablet by mouth daily., Disp: 90 tablet, Rfl: 0   b complex vitamins capsule, Take 1 capsule by mouth daily., Disp: , Rfl:    Cholecalciferol (VITAMIN D3) 5000 units TABS, Take 1 tablet by mouth daily. , Disp: , Rfl: 0   docusate sodium (COLACE) 100 MG capsule, Take 100 mg by mouth daily  as needed., Disp: , Rfl:    fluticasone (FLONASE) 50 MCG/ACT nasal spray, Place 2 sprays into both nostrils daily., Disp: 16 g, Rfl: 1   furosemide (LASIX) 20 MG tablet, Take 1 tablet by mouth daily as needed for increased fluid or weight gain., Disp: 90 tablet, Rfl: 0   gabapentin (NEURONTIN) 300 MG capsule, Take 300 mg by mouth 3 (three) times daily., Disp: , Rfl:    glucose blood test strip, ONETOUCH ULTRA MINI STRIPS AND LANCETS. Use one daily to check blood sugar., Disp: 100 each, Rfl: 3   lansoprazole (PREVACID) 15 MG capsule, Take 15 mg by mouth daily at 6 (six) AM., Disp: ,  Rfl:    metFORMIN (GLUCOPHAGE-XR) 500 MG 24 hr tablet, Take 1 tablet by mouth every evening. **Generic equivalent for Glucophage-XR.**, Disp: 90 tablet, Rfl: 2   methocarbamol (ROBAXIN) 500 MG tablet, Take 500 mg by mouth 2 (two) times daily., Disp: , Rfl:    metoprolol tartrate (LOPRESSOR) 25 MG tablet, Take 1 tablet (25 mg total) by mouth 2 (two) times daily., Disp: 180 tablet, Rfl: 0   montelukast (SINGULAIR) 10 MG tablet, Take 1 tablet (10 mg total) by mouth at bedtime., Disp: 30 tablet, Rfl: 1   Multiple Vitamins-Minerals (MULTI-VITAMIN GUMMIES PO), Take by mouth. Taking 1 daily, Disp: , Rfl:    ondansetron (ZOFRAN-ODT) 4 MG disintegrating tablet, 4 mg 2 (two) times daily as needed., Disp: , Rfl:    oxyCODONE (OXY IR/ROXICODONE) 5 MG immediate release tablet, Take 5 mg by mouth every 4 (four) hours as needed., Disp: , Rfl:    potassium chloride SA (KLOR-CON M20) 20 MEQ tablet, Take 2 tablets (40 meq) by mouth once daily only on days you are taking lasix. Please call [phone] to schedule an appointment., Disp: 90 tablet, Rfl: 0   [START ON 03/19/2022] QUEtiapine (SEROQUEL) 100 MG tablet, Take 1 tablet (100 mg total) by mouth at bedtime., Disp: 90 tablet, Rfl: 0   traMADol (ULTRAM) 50 MG tablet, Take 50 mg by mouth every 6 (six) hours., Disp: , Rfl:    [START ON 04/04/2022] venlafaxine XR (EFFEXOR-XR) 75 MG 24 hr capsule, Take 1 capsule (75 mg total) by mouth daily with breakfast., Disp: 90 capsule, Rfl: 0   zaleplon (SONATA) 5 MG capsule, Take 1 capsule (5 mg total) by mouth at bedtime as needed for sleep., Disp: 30 capsule, Rfl: 2     ALLERGIES   Lovastatin, Paroxetine hcl, and Lamotrigine    There were no vitals taken for this visit.     Review of Systems: Gen:  Denies  fever, sweats, chills weight loss  HEENT: Denies blurred vision, double vision, ear pain, eye pain, hearing loss, nose bleeds, sore throat Cardiac:  No dizziness, chest pain or heaviness, chest tightness,edema,  No JVD Resp:   No cough, -sputum production, -shortness of breath,-wheezing, -hemoptysis,  Other:  All other systems negative    Physical Examination:   General Appearance: No distress  EYES PERRLA, EOM intact.   NECK Supple, No JVD Pulmonary: normal breath sounds, No wheezing.  CardiovascularNormal S1,S2.  No m/r/g.   Abdomen: Benign, Soft, non-tender. ALL OTHER ROS ARE NEGATIVE     ASSESSMENT/PLAN   66 year old female seen today for follow-up assessment for COPD with underlying grade 2 diastolic heart failure with underlying sleep apnea with a previous enrollment in lung cancer screening program with extensive smoking history    COPD seems to be stable at this time  No additional inhalers needed at this  time  No exacerbations at this time    OSA  Continue CPAP therapy as prescribed  Patient uses and benefits    Grade 2 diastolic cardiac dysfunction Follow-up cardiology   Abnormal CT scan Lung cancer screening protocol Last CT scan noted was February 2021 CT of the chest reviewed with patient February 2021 Stable 5 mm left lung nodule consistent with benign however patient restarted smoking Will need follow-up CT chest    SMOKING CESSATION STRONGLY ADVISED   MEDICATION ADJUSTMENTS/LABS AND TESTS ORDERED: Albuterol as needed Avoid sick contacts Continue auto CPAP therapy Follow-up CT chest needed Albuterol as needed Please stop smoking  CURRENT MEDICATIONS REVIEWED AT LENGTH WITH PATIENT TODAY   Follow Up in 1 year  Total time spent 34 minutes  Wilson Sample Patricia Pesa, M.D.  [facility]  Medical Director [place] Director [facility] Cardio-Pulmonary Department

## 2022-03-12 NOTE — Patient Instructions (Addendum)
Albuterol as needed Avoid sick contacts Continue auto CPAP therapy EXCELLENT JOB A+  Follow-up CT chest needed-LUNG CANCER SCREENING  Albuterol as needed Please stop smoking

## 2022-03-12 NOTE — Addendum Note (Signed)
Addended by: Claudette Head A on: 03/12/2022 09:45 AM   Modules accepted: Orders

## 2022-03-25 ENCOUNTER — Other Ambulatory Visit: Payer: Self-pay | Admitting: Family Medicine

## 2022-03-29 DIAGNOSIS — G4733 Obstructive sleep apnea (adult) (pediatric): Secondary | ICD-10-CM | POA: Diagnosis not present

## 2022-04-06 ENCOUNTER — Other Ambulatory Visit: Payer: Self-pay | Admitting: Physician Assistant

## 2022-04-09 ENCOUNTER — Other Ambulatory Visit: Payer: Self-pay | Admitting: Internal Medicine

## 2022-04-09 NOTE — Telephone Encounter (Signed)
Please schedule 6 month F/U appointment. Thank you! 

## 2022-04-09 NOTE — Telephone Encounter (Signed)
Scheduled

## 2022-04-17 ENCOUNTER — Telehealth: Payer: Self-pay

## 2022-04-17 NOTE — Telephone Encounter (Signed)
pt states that the lowered dosage of the zaleplon is not working for her she lay in bed for 45 minutes trying to sleep.

## 2022-04-17 NOTE — Telephone Encounter (Signed)
Please advise her to stay at the current dose for now- it may take a while for her body to get used to the current dose. Please also advise her to be active during the day if possible.

## 2022-04-26 NOTE — Telephone Encounter (Signed)
Noted, thanks. Would NOT recommend increasing the dose at this time to avoid risk of dependence. Will plan to discuss more at her next visit.

## 2022-04-26 NOTE — Telephone Encounter (Signed)
called back patient to see how she been doing with her sleep. she states that she is walking during the day with one of her friends .  she still having trouble falling asleep but once she is asleep she stays asleep. she states she just wish she had something to makes her go to sleep faster.

## 2022-04-27 ENCOUNTER — Telehealth: Payer: Self-pay | Admitting: Internal Medicine

## 2022-04-27 DIAGNOSIS — G4733 Obstructive sleep apnea (adult) (pediatric): Secondary | ICD-10-CM

## 2022-04-27 NOTE — Telephone Encounter (Signed)
Spoke to patient. She feels that pressure should be stronger, due to SOB when laying flat.   Dr. Mortimer Fries, please advise. Thanks

## 2022-04-27 NOTE — Telephone Encounter (Signed)
Download is in your folder for review.

## 2022-04-30 NOTE — Telephone Encounter (Signed)
Spoke to patient and relayed below message/recommendations.  She agrees with titration study.  Order has been placed.  Nothing further needed.

## 2022-05-02 NOTE — Progress Notes (Unsigned)
Cardiology Office Note    Date:  05/08/2022   ID:  Katelyn Cooper, Katelyn Cooper 10-Sep-1955, MRN [id number]  PCP:  [name], MD  Cardiologist:  Nelva Bush, MD  Electrophysiologist:  None   Chief Complaint: Follow up  History of Present Illness:   Katelyn Cooper is a 67 y.o. female with history of nonobstructive CAD, PAD status post left iliac stent, HFpEF, mild to moderate mitral regurgitation, vasovagal syncope, DM2, thoracic outlet syndrome status post bilateral rib resections in 1990, HLD, COPD with ongoing tobacco use, GERD complicated by esophageal stricture, OSA on CPAP, and anxiety/depression who presents for follow-up of CAD.   LHC from 01/2016 demonstrated 40% stenosis in the proximal aspect of a large first septal perforator along with 30% OM1 stenosis and 30% proximal and mid RCA stenoses with an LVEDP of 15 to 20 mmHg.  R/LHC in 10/2017 showed 30% stenosis at the first septal perforator, OM1 30% stenosis, sequential 30% proximal and mid RCA stenoses, upper normal filling pressures and normal pulmonary artery pressure with a moderately reduced cardiac output as outlined below.  Lexiscan MPI from 03/2019 was low risk without evidence of ischemia or scar coronary artery calcification noted.  LVEF greater than 65%.  Echo from 04/2019 showed an EF of 60 to 18%, grade 1 diastolic dysfunction with elevated filling pressures, normal RV size and systolic function, no significant valvular abnormality, and normal CVP.   She was evaluated for syncope in 05/2020 that occurred after stubbing her toe with associated severe pain.  Outpatient cardiac monitor in 05/2020 showed a predominant rhythm of sinus with 5 runs of PSVT with the longest episode lasting 18.8 seconds as well as rare PACs and PVCs.  Episode of syncope was felt to be precipitated by vasovagal episode complicated by orthostatic hypotension in the setting of dehydration.   She was admitted to the hospital in 09/2021 with lower  extremity weakness with noted decreased oral intake and dysphagia following a cervical fusion in the context of cervical collar.  High-sensitivity troponin negative x2.  Blood cultures negative.  CBC consistent with hemoconcentration.  She was evaluated by orthopedics and PT with recommendation for home health PT.  She was last seen in the office in 10/2021 and was without angina or cardiac decompensation.  She reported several episodes of profound lower extremity weakness leading up to her hospital admission, which was felt to be noncardiac in etiology.  Echo in 12/2021 showed an EF of 55 to 60%, septal wall hypokinesis consistent with known left bundle branch block, normal LV diastolic function parameters, normal RV systolic function and ventricular cavity size, mildly elevated PASP estimated at 36.4 mmHg, mild to moderate mitral regurgitation, and an estimated right atrial pressure of 3 mmHg.  Zio patch at that time showed a predominant rhythm of sinus with an average rate of 85 bpm (range 66 to 132 bpm), a single atrial run lasting 5 beats was noted, rare PACs and PVCs, no sustained arrhythmias or prolonged pauses.  No patient triggered events.  She comes in doing well from a cardiac perspective and is without symptoms of angina or decompensation.  No progressive dyspnea.  No palpitations, dizziness, presyncope, or syncope.  Adherent and tolerating cardiac medications without issues.  She does note some bilateral thigh discomfort and weakness.  She is wondering if this is related to her hips.  She does continue to smoke 1 pack/day and is having a difficult time quitting.  Overall, she feels like she is doing  well from a cardiac perspective.   Labs independently reviewed: 12/2021 - A1c 6.5 10/2021 - BUN 7, SCr 0.76, potassium 4.9, Hgb 14.6, PLT 275 09/2021 - albumin 3.3, AST/ALT normal, Hgb 13.2, PLT 159, TSH normal 04/2021 - TC 142, TG 172, HDL 38, LDL 75  Past Medical History:  Diagnosis Date    (HFpEF) heart failure with preserved ejection fraction ([place])    a. 12/2015 Echo: EF 55-60%, nl RV size/fxn, no significant valvular abnormalities; b. 10/2017 Echo: EF 50-55%, antsept, ant HK. Gr2 DD. Mild MR. Nl RV fxn. PASP 41mHg.   Anxiety    Basal cell carcinoma 08/08/2017   L nose above mid nasal alar groove   CHF (congestive heart failure) (HCC)    Depression    Diabetes type 2, controlled ([facility]    diet-controlled   Esophageal stricture    GERD (gastroesophageal reflux disease)    Hiatal hernia    History of chicken pox    Hyperlipidemia    Hypertension    Internal hemorrhoids    Non-obstructive CAD (coronary artery disease)    a. 12/2015 MV: apical ant and apical reversible defect. EF 55-65%; b. 01/2016 Cath: LM nl, LAD nl, SP1 40ost, LCX nl, OM1 30, RCA 30p/m, EDP 15-253mg; c. 10/2017 Cath: LM nl, LAD nl, SP1 30ost, LCX nl, OM1 30, RCA 30p/m. EF 55%.   Obstructive sleep apnea    PAD (peripheral artery disease) ([facility]   a. 1990's s/p prior LCIA stenting; b. 12/2015 ABI: R 1.05, L 1.03.   Panic disorder     Past Surgical History:  Procedure Laterality Date   AORTA SURGERY  1999   stent placement; ? left iliac artery intervention   APPENDECTOMY  1998   BASAL CELL CARCINOMA EXCISION     C5 - fusion N/A 10/10/2021   CARDIAC CATHETERIZATION N/A 01/12/2016   Procedure: Left Heart Cath and Coronary Angiography;  Surgeon: ChNelva BushMD;  [facility];  Service: Cardiovascular;  Laterality: N/A;   COLONOSCOPY  07/17/2019   FOOT SURGERY     Right   pap smear  03/2010   Done by Dr. AnEverett Graffnormal   REFRACTIVE SURGERY     right   RIB RESECTION  [id number] RIGHT/LEFT HEART CATH AND CORONARY ANGIOGRAPHY N/A 11/08/2017   Procedure: RIGHT/LEFT HEART CATH AND CORONARY ANGIOGRAPHY;  Surgeon: ArWellington HampshireMD;  [facility];  Service: Cardiovascular;  Laterality: N/A;   SHOULDER SURGERY  2005   left   UPPER GASTROINTESTINAL ENDOSCOPY   07/17/2019   UPPER GI ENDOSCOPY  09/19/2011   Stricture at GE junction, dilated. Mild gastritis and duodenitis. Small hiatal hernia    Current Medications: Current Meds  Medication Sig   acetaminophen (TYLENOL) 500 MG tablet Take 500 mg by mouth every 6 (six) hours as needed.   albuterol (VENTOLIN HFA) 108 (90 Base) MCG/ACT inhaler Ventolin HFA 90 mcg/actuation aerosol inhaler  inhale 2 puffs by mouth every 6 hours if needed for wheezing or shortness of breath   aspirin EC 81 MG tablet Take 1 tablet (81 mg total) by mouth daily.   atorvastatin (LIPITOR) 80 MG tablet Take 1 tablet by mouth daily.   b complex vitamins capsule Take 1 capsule by mouth daily.   Cholecalciferol (VITAMIN D3) 5000 units TABS Take 1 tablet by mouth daily.    fluticasone (FLONASE) 50 MCG/ACT nasal spray Place 2 sprays into both nostrils daily.   glucose blood test strip ONETOUCH  ULTRA MINI STRIPS AND LANCETS. Use one daily to check blood sugar.   lansoprazole (PREVACID) 15 MG capsule Take 15 mg by mouth daily at 6 (six) AM.   metFORMIN (GLUCOPHAGE-XR) 500 MG 24 hr tablet Take 1 tablet by mouth every evening. **Generic equivalent for Glucophage-XR.**   metoprolol tartrate (LOPRESSOR) 25 MG tablet Take 1 tablet (25 mg total) by mouth 2 (two) times daily.   Multiple Vitamins-Minerals (MULTI-VITAMIN GUMMIES PO) Take by mouth. Taking 1 daily   potassium chloride SA (KLOR-CON M) 20 MEQ tablet Take 2 tablets by mouth once daily only on days you are taking Lasix. Please call [phone] to schedule an appointment.   venlafaxine XR (EFFEXOR-XR) 75 MG 24 hr capsule Take 1 capsule (75 mg total) by mouth daily with breakfast.   zaleplon (SONATA) 5 MG capsule Take 1 capsule (5 mg total) by mouth at bedtime as needed for sleep. (Patient taking differently: Take 5 mg by mouth at bedtime.)   [DISCONTINUED] furosemide (LASIX) 20 MG tablet Take 1 tablet by mouth daily as needed for increased fluid or weight gain. (Patient taking  differently: Take 20 mg by mouth every other day. Take 1 tablet by mouth daily as needed for increased fluid or weight gain.)    Allergies:   Lovastatin, Paroxetine hcl, and Lamotrigine   Social History   Socioeconomic History   Marital status: Married    Spouse name: mark   Number of children: 0   Years of education: Not on file   Highest education level: High school graduate  Occupational History   Occupation: computer-retired    Employer: [employer]  Tobacco Use   Smoking status: Every Day    Packs/day: 1.00    Years: 40.00    Total pack years: 40.00    Types: Cigarettes    Start date: 03/01/1975   Smokeless tobacco: Never  Vaping Use   Vaping Use: Former  Substance and Sexual Activity   Alcohol use: No    Alcohol/week: 0.0 standard drinks of alcohol   Drug use: No   Sexual activity: Not Currently  Other Topics Concern   Not on file  Social History Narrative   Not on file   Social Determinants of Health   Financial Resource Strain: Low Risk  (12/12/2021)   Overall Financial Resource Strain (CARDIA)    Difficulty of Paying Living Expenses: Not hard at all  Food Insecurity: No Food Insecurity (12/12/2021)   Hunger Vital Sign    Worried About Running Out of Food in the Last Year: Never true    [place] in the Last Year: Never true  Transportation Needs: No Transportation Needs (12/12/2021)   PRAPARE - Hydrologist (Medical): No    Lack of Transportation (Non-Medical): No  Physical Activity: Inactive (12/12/2021)   Exercise Vital Sign    Days of Exercise per Week: 0 days    Minutes of Exercise per Session: 0 min  Stress: No Stress Concern Present (12/12/2021)   [place]    Feeling of Stress : Only a little  Social Connections: Moderately Isolated (03/04/2017)   Social Connection and Isolation Panel [NHANES]    Frequency of Communication with Friends and Family: Once  a week    Frequency of Social Gatherings with Friends and Family: Never    Attends Religious Services: Never    Marine scientist or Organizations: No    Attends Archivist  Meetings: Never    Marital Status: Married     Family History:  The patient's family history includes Alcohol abuse in her father, mother, and another family member; Bipolar disorder in her sister and another family member; Breast cancer in her sister; Cancer in her mother; Depression in her mother and another family member; Drug abuse in her sister; Emphysema in her mother and sister; Esophageal cancer in her maternal aunt; Heart disease in her maternal grandmother, mother, and another family member; Lung cancer in her mother; Mood Disorder in her father; Stroke in her maternal grandmother; Vision loss in an other family member. There is no history of Colon cancer, Colon polyps, Rectal cancer, or Stomach cancer.  ROS:   12-point review of systems is negative unless otherwise noted in the HPI.   EKGs/Labs/Other Studies Reviewed:    Studies reviewed were summarized above. The additional studies were reviewed today:  2D echo 12/01/2021: 1. Left ventricular ejection fraction, by estimation, is 55 to 60%. The  left ventricle has normal function. Septal wall hypokinesis consistent  with bundle branch block. Left ventricular diastolic parameters were  normal.   2. Right ventricular systolic function is normal. The right ventricular  size is normal. There is mildly elevated pulmonary artery systolic  pressure. The estimated right ventricular systolic pressure is 25.8 mmHg.   3. The mitral valve is normal in structure. Mild to moderate mitral valve  regurgitation. No evidence of mitral stenosis.   4. The aortic valve is normal in structure. Aortic valve regurgitation is  not visualized. No aortic stenosis is present.   5. The inferior vena cava is normal in size with greater than 50%  respiratory variability,  suggesting right atrial pressure of 3 mmHg.  __________  Elwyn Reach patch 10/2021:   The patient was monitored for 13 days, 14 hours.   The predominant rhythm was sinus with an average rate of 85 bpm (range 66-132 bpm).   There were rare PACs and PVCs.  A single atrial run lasting 5 beats occurred, with a maximum rate of 111 bpm.   No sustained arrhythmia or prolonged pause was identified.   There were no patient triggered events.   Predominantly sinus rhythm with rare PACs and PVCs as well as a single brief supraventricular run.  No significant arrhythmia observed. __________  Elwyn Reach patch 05/2020: The patient was monitored for 13 days, 19 hours. The predominant rhythm was sinus with an average rate of 84 bpm (range 65-143 bpm in sinus). There were rare PAC's and PVC's. Five atrial runs lasting up to 18.8 seconds occurred with a maximum rate of 156 bpm. No sustained arrhythmia or prolonged pause was identified. There were no patient triggered events.   Predominantly sinus rhythm with a few episodes of PSVT as well as rare PAC's and PVC's. __________   2D echo 04/14/2019: 1. Left ventricular ejection fraction, by visual estimation, is 60 to  65%. The left ventricle has normal function. There is no left ventricular  hypertrophy.   2. Elevated left atrial pressure.   3. Left ventricular diastolic parameters are consistent with Grade I  diastolic dysfunction (impaired relaxation).   4. The left ventricle has no regional wall motion abnormalities.   5. Global right ventricle has normal systolic function.The right  ventricular size is normal. No increase in right ventricular wall  thickness.   6. Left atrial size was normal.   7. Right atrial size was normal.   8. The mitral valve is normal in structure. Trivial  mitral valve  regurgitation. No evidence of mitral stenosis.   9. The tricuspid valve is not well visualized.  10. The aortic valve was not well visualized. Aortic valve regurgitation   is not visualized. No evidence of aortic valve sclerosis or stenosis.  11. The pulmonic valve was not well visualized. Pulmonic valve  regurgitation is not visualized.  12. TR signal is inadequate for assessing pulmonary artery systolic  pressure.  13. The inferior vena cava is normal in size with greater than 50%  respiratory variability, suggesting right atrial pressure of 3 mmHg.  14. The interatrial septum was not well visualized.   In comparison to the previous echocardiogram(s): Anterior/anterior septum  hypokinesis seen on previous test 11/06/2017. EF 50-55%. __________   [facility] MPI 03/20/2019: Pharmacological myocardial perfusion imaging study with no significant  ischemia Normal wall motion, EF estimated at 85% No EKG changes concerning for ischemia at peak stress or in recovery. CT attenuation correction images with mild coronary calcification at bifurcation of LAD/left circumflex, moderate calcification of the descending aorta Resting EKG with sinus rhythm, left bundle branch block Low risk scan __________   [facility] 11/08/2017: Ost 1st Sept lesion is 30% stenosed. 1st Mrg lesion is 30% stenosed. Prox RCA lesion is 30% stenosed. Mid RCA lesion is 30% stenosed. The left ventricular systolic function is normal. LV end diastolic pressure is mildly elevated.   1.  Mild nonobstructive coronary artery disease. 2.  Normal LV systolic function with an EF of 55% with borderline mid anterior wall hypokinesis likely due to IVCD. 3.  Right heart catheterization showed high normal filling pressures, normal pulmonary pressure and moderately reduced cardiac output.  Pulmonary capillary wedge pressure was 11 mmHg, PA pressure was 27 over 11 mmHg, cardiac output was 3.1 with a cardiac index of 1.8.   Recommendations: Continue medical therapy for diastolic heart failure.  Volume status has improved close to normal and thus I am going to switch furosemide to oral. __________   2D echo  11/06/2017: - Left ventricle: The cavity size was normal. Wall thickness was    normal. Systolic function was normal. The estimated ejection    fraction was in the range of 50% to 55%. There is hypokinesis of    the anteroseptal and anterior myocardium. Features are consistent    with a pseudonormal left ventricular filling pattern, with    concomitant abnormal relaxation and increased filling pressure    (grade 2 diastolic dysfunction). Doppler parameters are    consistent with high ventricular filling pressure.  - Mitral valve: There was mild regurgitation.  - Right ventricle: The cavity size was normal. Wall thickness was    normal. Systolic function was normal.  - Pulmonary arteries: Systolic pressure was mildly increased,    estimated to be 35 mm Hg.   Impressions:   - Compared with prior echo from 12/28/15, anterior/anteroseptal    hypokinesis is new. __________   [facility] 01/12/2016: Conclusions: Mild, non-obstructive coronary artery disease.  There is slightly sluggish flow throughout the coronary arteries, which may reflect microvascular dysfunction. Upper normal to mildly elevated left ventricular filling pressure.   Recommendations Continue risk factor modification. Uptitrate metoprolol and continue long-acting nitrate for possible component of microvascular dysfunction. Outpatient follow-up with myself. __________   2D echo 12/28/2015: - Procedure narrative: Transthoracic echocardiography. Image    quality was fair. The study was technically difficult.  - Left ventricle: The cavity size was normal. Wall thickness was    normal. Systolic function was normal. The estimated  ejection    fraction was in the range of 55% to 60%. Wall motion was normal    grossly; there were no regional wall motion abnormalities. Left    ventricular diastolic function parameters were normal for the    patient&'s age. __________   Nuclear stress test 12/23/2015: There was no ST segment deviation  noted during stress. Defect 1: There is a small defect of mild severity present in the apical anterior and apex location. Findings suggestive small area of ischemia. However, shifting breast atteunation artifact can not be excluded. This is a low risk study. The left ventricular ejection fraction is normal (55-65%). TID was present.   EKG:  EKG is ordered today.  The EKG ordered today demonstrates 79 bpm, nonspecific IVCD with history of known intermittent LBBB  Recent Labs: 10/18/2021: TSH 0.934 10/19/2021: ALT 21 10/31/2021: BUN 7; Creatinine, Ser 0.76; Hemoglobin 14.6; Platelets 275; Potassium 4.9; Sodium 142  Recent Lipid Panel    Component Value Date/Time   CHOL 142 05/01/2021 0806   TRIG 172 (H) 05/01/2021 0806   HDL 38 (L) 05/01/2021 0806   CHOLHDL 3.7 05/01/2021 0806   CHOLHDL 3.1 03/14/2020 0836   VLDL 41 (H) 03/14/2020 0836   LDLCALC 75 05/01/2021 0806    PHYSICAL EXAM:    VS:  BP (!) 118/56 (BP Location: Left Arm, Patient Position: Sitting, Cuff Size: Normal)   Pulse 79   Ht '5\' 3"'$  (1.6 m)   Wt 149 lb 3.2 oz (67.7 kg)   SpO2 95%   BMI 26.43 kg/m   BMI: Body mass index is 26.43 kg/m.  Physical Exam Vitals reviewed.  Constitutional:      Appearance: She is well-developed.  HENT:     Head: Normocephalic and atraumatic.  Eyes:     General:        Right eye: No discharge.        Left eye: No discharge.  Neck:     Vascular: No JVD.  Cardiovascular:     Rate and Rhythm: Normal rate and regular rhythm.     Pulses:          Posterior tibial pulses are 2+ on the right side and 2+ on the left side.     Heart sounds: Normal heart sounds, S1 normal and S2 normal. Heart sounds not distant. No midsystolic click and no opening snap. No murmur heard.    No friction rub.  Pulmonary:     Effort: Pulmonary effort is normal. No respiratory distress.     Breath sounds: Normal breath sounds. No decreased breath sounds, wheezing or rales.  Chest:     Chest wall: No  tenderness.  Abdominal:     General: There is no distension.  Musculoskeletal:     Cervical back: Normal range of motion.     Right lower leg: No edema.     Left lower leg: No edema.  Skin:    General: Skin is warm and dry.     Nails: There is no clubbing.  Neurological:     Mental Status: She is alert and oriented to person, place, and time.  Psychiatric:        Speech: Speech normal.        Behavior: Behavior normal.        Thought Content: Thought content normal.        Judgment: Judgment normal.     Wt Readings from Last 3 Encounters:  05/08/22 149 lb 3.2 oz (67.7 kg)  03/12/22  144 lb 6.4 oz (65.5 kg)  01/30/22 144 lb (65.3 kg)     ASSESSMENT & PLAN:   Nonobstructive CAD: No symptoms suggestive of angina.  Continue risk factor modification including aspirin, atorvastatin, and metoprolol.  HFpEF/pulmonary hypertension: She appears euvolemic and well compensated with NYHA class I symptoms.  She remains on furosemide 20 mg every other day with KCl repletion.  Check renal function and electrolytes.  Mitral regurgitation: Stable, mild to moderate by echo in 12/2021.  Monitor periodically.  PAD: Status post left iliac stenting.  She remains on aspirin and atorvastatin.  Obtain ABI and lower extremity arterial ultrasound.  Walking regimen is encouraged.  Complete smoking cessation encouraged.  HLD: LDL 75 in 04/2021.  She remains on atorvastatin 80 mg.  Check lipid panel, direct LDL, and LFT.  LBBB: Intermittent.  No symptoms of syncope.  Echo with preserved LV systolic function.  COPD with ongoing tobacco use: No symptoms of exacerbation.  Followed by pulmonology.  Complete smoking cessation is encouraged.   Disposition: F/u with Dr. Saunders Revel or an APP in 12 months.   Medication Adjustments/Labs and Tests Ordered: Current medicines are reviewed at length with the patient today.  Concerns regarding medicines are outlined above. Medication changes, Labs and Tests ordered today are  summarized above and listed in the Patient Instructions accessible in Encounters.   Signed, Christell Faith, PA-C 05/08/2022 9:12 AM     [address] [place], [place] [address] [phone]

## 2022-05-05 ENCOUNTER — Other Ambulatory Visit: Payer: Self-pay | Admitting: Physician Assistant

## 2022-05-07 NOTE — Telephone Encounter (Signed)
Patient scheduled to see Christell Faith, PA tomorrow. Will you please refill if appropriate? Thank you!

## 2022-05-08 ENCOUNTER — Encounter: Payer: Self-pay | Admitting: Physician Assistant

## 2022-05-08 ENCOUNTER — Other Ambulatory Visit
Admission: RE | Admit: 2022-05-08 | Discharge: 2022-05-08 | Disposition: A | Discharge status: Home / Self Care | Payer: BC Managed Care – PPO | Source: Ambulatory Visit | Attending: Physician Assistant | Admitting: Physician Assistant

## 2022-05-08 ENCOUNTER — Ambulatory Visit: Payer: BC Managed Care – PPO | Attending: Physician Assistant | Admitting: Physician Assistant

## 2022-05-08 VITALS — BP 118/56 | HR 79 | Ht 63.0 in | Wt 149.2 lb

## 2022-05-08 DIAGNOSIS — I447 Left bundle-branch block, unspecified: Secondary | ICD-10-CM

## 2022-05-08 DIAGNOSIS — I272 Pulmonary hypertension, unspecified: Secondary | ICD-10-CM | POA: Diagnosis not present

## 2022-05-08 DIAGNOSIS — I340 Nonrheumatic mitral (valve) insufficiency: Secondary | ICD-10-CM

## 2022-05-08 DIAGNOSIS — I739 Peripheral vascular disease, unspecified: Secondary | ICD-10-CM | POA: Diagnosis not present

## 2022-05-08 DIAGNOSIS — I5032 Chronic diastolic (congestive) heart failure: Secondary | ICD-10-CM | POA: Diagnosis not present

## 2022-05-08 DIAGNOSIS — J449 Chronic obstructive pulmonary disease, unspecified: Secondary | ICD-10-CM

## 2022-05-08 DIAGNOSIS — Z720 Tobacco use: Secondary | ICD-10-CM | POA: Diagnosis not present

## 2022-05-08 DIAGNOSIS — I2510 Atherosclerotic heart disease of native coronary artery without angina pectoris: Secondary | ICD-10-CM

## 2022-05-08 LAB — COMPREHENSIVE METABOLIC PANEL
ALT: 29 U/L (ref 0–44)
AST: 32 U/L (ref 15–41)
Albumin: 4.1 g/dL (ref 3.5–5.0)
Alkaline Phosphatase: 142 U/L — ABNORMAL HIGH (ref 38–126)
Anion gap: 11 (ref 5–15)
BUN: 8 mg/dL (ref 8–23)
CO2: 26 mmol/L (ref 22–32)
Calcium: 9.3 mg/dL (ref 8.9–10.3)
Chloride: 103 mmol/L (ref 98–111)
Creatinine, Ser: 0.76 mg/dL (ref 0.44–1.00)
GFR, Estimated: >60 mL/min (ref >60)
Glucose, Bld: 143 mg/dL — ABNORMAL HIGH (ref 70–99)
Potassium: 4.1 mmol/L (ref 3.5–5.1)
Sodium: 140 mmol/L (ref 135–145)
Total Bilirubin: 0.6 mg/dL (ref 0.3–1.2)
Total Protein: 7.9 g/dL (ref 6.5–8.1)

## 2022-05-08 LAB — LIPID PANEL
Cholesterol: 160 mg/dL (ref 0–200)
HDL: 43 mg/dL (ref >40)
LDL Cholesterol: 78 mg/dL (ref 0–99)
Total CHOL/HDL Ratio: 3.7 RATIO
Triglycerides: 196 mg/dL — ABNORMAL HIGH (ref <150)
VLDL: 39 mg/dL (ref 0–40)

## 2022-05-08 LAB — LDL CHOLESTEROL, DIRECT: Direct LDL: 104 mg/dL — ABNORMAL HIGH (ref 0–99)

## 2022-05-08 MED ORDER — FUROSEMIDE 20 MG PO TABS: 20.0000 mg | ORAL_TABLET | ORAL | 3 refills | Status: DC

## 2022-05-08 NOTE — Patient Instructions (Addendum)
Medication Instructions:  No changes at this time.   *If you need a refill on your cardiac medications before your next appointment, please call your pharmacy*   Lab Work: Lipid, direct LDL, & Cmet to be done over at the [place] entrance and stop at registration desk to check in.   If you have labs (blood work) drawn today and your tests are completely normal, you will receive your results only by: [place] (if you have MyChart) OR A paper copy in the mail If you have any lab test that is abnormal or we need to change your treatment, we will call you to review the results.   Testing/Procedures: Your physician has requested that you have an ankle brachial index (ABI). During this test an ultrasound and blood pressure cuff are used to evaluate the arteries that supply the arms and legs with blood. Allow thirty minutes for this exam. There are no restrictions or special instructions.  Your physician has requested that you have a lower extremity arterial exercise duplex. During this test, exercise and ultrasound are used to evaluate arterial blood flow in the legs. Allow one hour for this exam. There are no restrictions or special instructions.    Follow-Up: At [facility], you and your health needs are our priority.  As part of our continuing mission to provide you with exceptional heart care, we have created designated Provider Care Teams.  These Care Teams include your primary Cardiologist (physician) and Advanced Practice Providers (APPs -  Physician Assistants and Nurse Practitioners) who all work together to provide you with the care you need, when you need it.   Your next appointment:   1 year(s)  Provider:   Nelva Bush, MD or Christell Faith, PA-C

## 2022-05-10 ENCOUNTER — Telehealth: Payer: Self-pay | Admitting: *Deleted

## 2022-05-10 DIAGNOSIS — E1169 Type 2 diabetes mellitus with other specified complication: Secondary | ICD-10-CM

## 2022-05-10 DIAGNOSIS — I739 Peripheral vascular disease, unspecified: Secondary | ICD-10-CM

## 2022-05-10 DIAGNOSIS — I2510 Atherosclerotic heart disease of native coronary artery without angina pectoris: Secondary | ICD-10-CM

## 2022-05-10 DIAGNOSIS — E785 Hyperlipidemia, unspecified: Secondary | ICD-10-CM

## 2022-05-10 MED ORDER — EZETIMIBE 10 MG PO TABS: 10.0000 mg | ORAL_TABLET | Freq: Every day | ORAL | 3 refills | Status: DC

## 2022-05-10 NOTE — Telephone Encounter (Signed)
-----   Message from Rise Mu, PA-C sent at 05/09/2022  6:52 AM EST ----- Potassium at goal Random glucose elevated with known diabetes AST/ALT normal Alkaline phosphatase mildly elevated which is nonspecific and consistent with prior readings Triglycerides mildly elevated, though this was a nonfasting sample LDL mildly elevated at 104  If she is agreeable, please start ezetimibe 10 mg daily with continuation of atorvastatin with follow-up fasting lipid panel and LFT in 2 months

## 2022-05-10 NOTE — Telephone Encounter (Signed)
Reviewed results and recommendations with patient. She verbalized understanding with no further questions at this time.  ?

## 2022-05-14 ENCOUNTER — Telehealth: Payer: Self-pay

## 2022-05-14 NOTE — Telephone Encounter (Signed)
pt states that the zaleplon 33m is not working no differences.   Pt was last seen on 03-07-22 next appt 05-30-22

## 2022-05-14 NOTE — Telephone Encounter (Signed)
spoke with patient she states that she been taking 2 of the 61m and it still does not work but she states she will not have enough to get to her next appt

## 2022-05-14 NOTE — Telephone Encounter (Signed)
I advised against increasing the dosage back to 10 mg and recommended staying on 5 mg. Although I can proceed with ordering the 5 mg dosage at this time, please note that she may need to pay out of pocket for it. Please let me know if she still wants this order to be sent, but I want to reiterate that it will not be for the 10 mg dosage.

## 2022-05-14 NOTE — Telephone Encounter (Signed)
Do you mean that the medication is not working, or that there is no difference compared to 10 mg? Regardless, please advise her to continue with the current dose until the next visit.

## 2022-05-21 ENCOUNTER — Ambulatory Visit: Payer: BC Managed Care – PPO | Attending: Otolaryngology

## 2022-05-21 DIAGNOSIS — G4733 Obstructive sleep apnea (adult) (pediatric): Secondary | ICD-10-CM | POA: Diagnosis not present

## 2022-05-23 ENCOUNTER — Other Ambulatory Visit: Payer: Self-pay | Admitting: *Deleted

## 2022-05-23 DIAGNOSIS — Z87891 Personal history of nicotine dependence: Secondary | ICD-10-CM

## 2022-05-23 DIAGNOSIS — Z122 Encounter for screening for malignant neoplasm of respiratory organs: Secondary | ICD-10-CM

## 2022-05-25 ENCOUNTER — Other Ambulatory Visit: Payer: Self-pay | Admitting: Physician Assistant

## 2022-05-25 ENCOUNTER — Other Ambulatory Visit: Payer: Self-pay | Admitting: Psychiatry

## 2022-05-25 DIAGNOSIS — G4700 Insomnia, unspecified: Secondary | ICD-10-CM

## 2022-05-25 DIAGNOSIS — F3341 Major depressive disorder, recurrent, in partial remission: Secondary | ICD-10-CM

## 2022-05-25 NOTE — Progress Notes (Unsigned)
Virtual Visit via Video Note  I connected with Katelyn Cooper on 05/30/22 at  9:00 AM EST by a video enabled telemedicine application and verified that I am speaking with the correct person using two identifiers.  Location: Patient: home Provider: office Persons participated in the visit- patient, provider    I discussed the limitations of evaluation and management by telemedicine and the availability of in person appointments. The patient expressed understanding and agreed to proceed.     I discussed the assessment and treatment plan with the patient. The patient was provided an opportunity to ask questions and all were answered. The patient agreed with the plan and demonstrated an understanding of the instructions.   The patient was advised to call back or seek an in-person evaluation if the symptoms worsen or if the condition fails to improve as anticipated.  I provided 15 minutes of non-face-to-face time during this encounter.   [name], MD     [facility] MD/PA/NP OP Progress Note  05/30/2022 9:36 AM Areal Cooper  MRN:  [id number]  Chief Complaint:  Chief Complaint  Patient presents with   Follow-up   HPI:  This is a follow-up appointment for anxiety and depression, insomnia.  She states that she is doing good except the sleep.  She has initial insomnia since tapering down Sonata she does not have any issues when she is able to sleep.  She started to drink moonshine up to 2 glasses at night to ease.  She feels that her head keeps going.  Although she feels nervous and feels fidgety during the day , she has been able to handle it as she keeps herself busy by cross stitching.  She takes a walk 3 times a week for 2 hours with her friend at [employer]  She denies feeling depressed.  She denies panic attacks.  She denies irritability.  She denies drug use.  She denies any hypertension, referring to her taking metoprolol twice a day for heart rate. She has an upcoming  appointment for sleep study in a week.   VS:  BP (!) 118/56, 05/2022  Wt Readings from Last 3 Encounters:  05/08/22 149 lb 3.2 oz (67.7 kg)  03/12/22 144 lb 6.4 oz (65.5 kg)  01/30/22 144 lb (65.3 kg)     Exercise: walk 2 miles, 3 days a week with her friend Employment: retired/Used to work for [employer] in 2018, work 3 days a week at [employer].  Support: husband Household: husband Marital status: married in 1996. Married twice Number of children: 0. 1 step son from previous marriage (she raised him. 68 yo in 2022) Last PCP / ongoing medical evaluation:  due next month for annual visit   Visit Diagnosis:    ICD-10-CM   1. Anxiety disorder, unspecified type  F41.9     2. MDD (major depressive disorder), recurrent, in partial remission (HCC)  F33.41 QUEtiapine (SEROQUEL) 100 MG tablet    venlafaxine XR (EFFEXOR-XR) 75 MG 24 hr capsule    3. Insomnia, unspecified type  G47.00       Past Psychiatric History: Please see initial evaluation for full details. I have reviewed the history. No updates at this time.     Past Medical History:  Past Medical History:  Diagnosis Date   (HFpEF) heart failure with preserved ejection fraction ([place])    a. 12/2015 Echo: EF 55-60%, nl RV size/fxn, no significant valvular abnormalities; b. 10/2017 Echo: EF 50-55%, antsept, ant HK. Gr2 DD.  Mild MR. Nl RV fxn. PASP 7mHg.   Anxiety    Basal cell carcinoma 08/08/2017   L nose above mid nasal alar groove   CHF (congestive heart failure) (HCC)    Depression    Diabetes type 2, controlled ([place]    diet-controlled   Esophageal stricture    GERD (gastroesophageal reflux disease)    Hiatal hernia    History of chicken pox    Hyperlipidemia    Hypertension    Internal hemorrhoids    Non-obstructive CAD (coronary artery disease)    a. 12/2015 MV: apical ant and apical reversible defect. EF 55-65%; b. 01/2016 Cath: LM nl, LAD nl, SP1 40ost, LCX nl, OM1 30, RCA 30p/m, EDP 15-269mg; c. 10/2017 Cath:  LM nl, LAD nl, SP1 30ost, LCX nl, OM1 30, RCA 30p/m. EF 55%.   Obstructive sleep apnea    PAD (peripheral artery disease) ([place]   a. 1990's s/p prior LCIA stenting; b. 12/2015 ABI: R 1.05, L 1.03.   Panic disorder     Past Surgical History:  Procedure Laterality Date   AORTA SURGERY  1999   stent placement; ? left iliac artery intervention   APPENDECTOMY  1998   BASAL CELL CARCINOMA EXCISION     C5 - fusion N/A 10/10/2021   CARDIAC CATHETERIZATION N/A 01/12/2016   Procedure: Left Heart Cath and Coronary Angiography;  Surgeon: ChNelva BushMD;  [facility];  Service: Cardiovascular;  Laterality: N/A;   COLONOSCOPY  07/17/2019   FOOT SURGERY     Right   pap smear  03/2010   Done by Dr. AnEverett Graffnormal   REFRACTIVE SURGERY     right   RIB RESECTION  [id number] RIGHT/LEFT HEART CATH AND CORONARY ANGIOGRAPHY N/A 11/08/2017   Procedure: RIGHT/LEFT HEART CATH AND CORONARY ANGIOGRAPHY;  Surgeon: ArWellington HampshireMD;  [facility];  Service: Cardiovascular;  Laterality: N/A;   SHOULDER SURGERY  2005   left   UPPER GASTROINTESTINAL ENDOSCOPY  07/17/2019   UPPER GI ENDOSCOPY  09/19/2011   Stricture at GE junction, dilated. Mild gastritis and duodenitis. Small hiatal hernia    Family Psychiatric History: Please see initial evaluation for full details. I have reviewed the history. No updates at this time.     Family History:  Family History  Problem Relation Age of Onset   Lung cancer Mother        was a smoker   Emphysema Mother    Cancer Mother        Lung   Alcohol abuse Mother    Depression Mother    Heart disease Mother    Drug abuse Sister    Breast cancer Sister        x 2  (30&50)   Emphysema Sister    Bipolar disorder Sister    Alcohol abuse Other    Depression Other    Bipolar disorder Other    Heart disease Other    Vision loss Other    Alcohol abuse Father    Mood Disorder Father    Heart disease Maternal  Grandmother    Stroke Maternal Grandmother    Esophageal cancer Maternal Aunt    Colon cancer Neg Hx    Colon polyps Neg Hx    Rectal cancer Neg Hx    Stomach cancer Neg Hx     Social History:  Social History   Socioeconomic History   Marital status: Married  Spouse name: mark   Number of children: 0   Years of education: Not on file   Highest education level: High school graduate  Occupational History   Occupation: computer-retired    Employer: [employer]  Tobacco Use   Smoking status: Every Day    Packs/day: 1.00    Years: 40.00    Total pack years: 40.00    Types: Cigarettes    Start date: 03/01/1975   Smokeless tobacco: Never  Vaping Use   Vaping Use: Former  Substance and Sexual Activity   Alcohol use: No    Alcohol/week: 0.0 standard drinks of alcohol   Drug use: No   Sexual activity: Not Currently  Other Topics Concern   Not on file  Social History Narrative   Not on file   Social Determinants of Health   Financial Resource Strain: Low Risk  (12/12/2021)   Overall Financial Resource Strain (CARDIA)    Difficulty of Paying Living Expenses: Not hard at all  Food Insecurity: No Food Insecurity (12/12/2021)   Hunger Vital Sign    Worried About Running Out of Food in the Last Year: Never true    [place] in the Last Year: Never true  Transportation Needs: No Transportation Needs (12/12/2021)   PRAPARE - Hydrologist (Medical): No    Lack of Transportation (Non-Medical): No  Physical Activity: Inactive (12/12/2021)   Exercise Vital Sign    Days of Exercise per Week: 0 days    Minutes of Exercise per Session: 0 min  Stress: No Stress Concern Present (12/12/2021)   [place]    Feeling of Stress : Only a little  Social Connections: Moderately Isolated (03/04/2017)   Social Connection and Isolation Panel [NHANES]    Frequency of Communication with Friends and  Family: Once a week    Frequency of Social Gatherings with Friends and Family: Never    Attends Religious Services: Never    Marine scientist or Organizations: No    Attends Archivist Meetings: Never    Marital Status: Married    Allergies:  Allergies  Allergen Reactions   Lovastatin     depression   Paroxetine Hcl     itching   Lamotrigine Rash    Metabolic Disorder Labs: Lab Results  Component Value Date   HGBA1C 6.5 (A) 01/30/2022   No results found for: "PROLACTIN" Lab Results  Component Value Date   CHOL 160 05/08/2022   TRIG 196 (H) 05/08/2022   HDL 43 05/08/2022   CHOLHDL 3.7 05/08/2022   VLDL 39 05/08/2022   LDLCALC 78 05/08/2022   LDLCALC 75 05/01/2021   Lab Results  Component Value Date   TSH 0.934 10/18/2021   TSH 1.660 06/02/2020    Therapeutic Level Labs: No results found for: "LITHIUM" No results found for: "VALPROATE" No results found for: "CBMZ"  Current Medications: Current Outpatient Medications  Medication Sig Dispense Refill   doxepin (SINEQUAN) 10 MG capsule Take 1 capsule (10 mg total) by mouth at bedtime as needed (insomnia). 30 capsule 1   acetaminophen (TYLENOL) 500 MG tablet Take 500 mg by mouth every 6 (six) hours as needed.     albuterol (VENTOLIN HFA) 108 (90 Base) MCG/ACT inhaler Ventolin HFA 90 mcg/actuation aerosol inhaler  inhale 2 puffs by mouth every 6 hours if needed for wheezing or shortness of breath     aspirin EC 81 MG tablet  Take 1 tablet (81 mg total) by mouth daily.     atorvastatin (LIPITOR) 80 MG tablet Take 1 tablet by mouth daily. 90 tablet 0   b complex vitamins capsule Take 1 capsule by mouth daily.     Cholecalciferol (VITAMIN D3) 5000 units TABS Take 1 tablet by mouth daily.   0   ezetimibe (ZETIA) 10 MG tablet Take 1 tablet (10 mg total) by mouth daily. 90 tablet 3   fluticasone (FLONASE) 50 MCG/ACT nasal spray Place 2 sprays into both nostrils daily. 16 g 1   furosemide (LASIX) 20 MG tablet  Take 1 tablet (20 mg total) by mouth every other day. Take 1 tablet by mouth daily as needed for increased fluid or weight gain. 90 tablet 3   gabapentin (NEURONTIN) 300 MG capsule Take 300 mg by mouth 3 (three) times daily. (Patient not taking: Reported on 03/12/2022)     glucose blood test strip ONETOUCH ULTRA MINI STRIPS AND LANCETS. Use one daily to check blood sugar. 100 each 3   lansoprazole (PREVACID) 15 MG capsule Take 15 mg by mouth daily at 6 (six) AM.     metFORMIN (GLUCOPHAGE-XR) 500 MG 24 hr tablet Take 1 tablet by mouth every evening. **Generic equivalent for Glucophage-XR.** 90 tablet 2   methocarbamol (ROBAXIN) 500 MG tablet Take 500 mg by mouth 2 (two) times daily. (Patient not taking: Reported on 03/12/2022)     metoprolol tartrate (LOPRESSOR) 25 MG tablet Take 1 tablet by mouth twice daily. 180 tablet 3   montelukast (SINGULAIR) 10 MG tablet TAKE 1 TABLET BY MOUTH EVERYDAY AT BEDTIME (Patient not taking: Reported on 05/08/2022) 30 tablet 1   Multiple Vitamins-Minerals (MULTI-VITAMIN GUMMIES PO) Take by mouth. Taking 1 daily     oxyCODONE (OXY IR/ROXICODONE) 5 MG immediate release tablet Take 5 mg by mouth every 4 (four) hours as needed. (Patient not taking: Reported on 03/12/2022)     potassium chloride SA (KLOR-CON M) 20 MEQ tablet Take 2 tablets by mouth once daily only on days you are taking Lasix. Please call [phone] to schedule an appointment. 60 tablet 0   [START ON 06/17/2022] QUEtiapine (SEROQUEL) 100 MG tablet Take 1 tablet (100 mg total) by mouth at bedtime. 90 tablet 0   traMADol (ULTRAM) 50 MG tablet Take 50 mg by mouth every 6 (six) hours. (Patient not taking: Reported on 03/12/2022)     [START ON 07/03/2022] venlafaxine XR (EFFEXOR-XR) 75 MG 24 hr capsule Take 1 capsule (75 mg total) by mouth daily with breakfast. 90 capsule 0   No current facility-administered medications for this visit.     Musculoskeletal: Strength & Muscle Tone: within normal limits Gait &  Station: normal Patient leans: N/A  Psychiatric Specialty Exam: Review of Systems  Psychiatric/Behavioral:  Positive for sleep disturbance. Negative for agitation, behavioral problems, confusion, decreased concentration, dysphoric mood, hallucinations, self-injury and suicidal ideas. The patient is nervous/anxious. The patient is not hyperactive.   All other systems reviewed and are negative.   There were no vitals taken for this visit.There is no height or weight on file to calculate BMI.  General Appearance: Fairly Groomed  Eye Contact:  Good  Speech:  Clear and Coherent  Volume:  Normal  Mood:   good  Affect:  Appropriate, Congruent, and calm  Thought Process:  Coherent  Orientation:  Full (Time, Place, and Person)  Thought Content: Logical   Suicidal Thoughts:  No  Homicidal Thoughts:  No  Memory:  Immediate;   Good  Judgement:  Good  Insight:  Good  Psychomotor Activity:  Normal  Concentration:  Concentration: Good and Attention Span: Good  Recall:  Good  Fund of Knowledge: Good  Language: Good  Akathisia:  No  Handed:  Right  AIMS (if indicated): not done  Assets:  Communication Skills Desire for Improvement  ADL's:  Intact  Cognition: WNL  Sleep:  Poor   Screenings: [place] Office Visit from 03/10/2018 in [place] Total Score 0      PHQ2-9    [place] Visit from 01/30/2022 in [place] [facility] from 12/12/2021 in [facility] Office Visit from 08/10/2021 in [place] Office Visit from 04/24/2021 in [place] Video Visit from 03/14/2021 in [facility]  PHQ-2 Total Score 0 0 0 0 0  PHQ-9 Total Score -- -- 0 1 --      Flowsheet Row ED to Hosp-Admission (Discharged) from 10/18/2021 in [place] Video Visit from 03/14/2021 in [place] Video Visit from 10/05/2020 in [place] No Risk No Risk No Risk        Assessment and Plan:  Deyonna Viehman is a 67 y.o. year old female with a history of PTSD, depression, nonobstructive CAD, PAD status post left iliac stent, chronic HFpEF, diet-controlled DM2, thoracic outlet syndrome status post bilateral rib resections (0000000), GERD complicated by esophageal stricture, HLD, who presents for follow up appointment for below.   1. MDD (major depressive disorder), recurrent, in partial remission ([place]) 2. Anxiety disorder, unspecified type Acute stressors include:  Other stressors include:    History:  worsening in irritability in the context of tapering down quetiapine. Though she was diagnosed with bipolar 1 d/o, no manic episodes except irritability.   She reports anxiety, although it has been overall manageable during the day. She reports good support from her husband, and maintains good connection with her friend.  Will continue current dose of venlafaxine and quetiapine to target depression and anxiety.  Will consider uptitration of venlafaxine in the future if her anxiety continues.    # Insomnia Significantly worsening in the context of tapering down Sonata to avoid tolerance due to racing thoughts.  She is willing to try doxepin.  Discussed potential risk of drowsiness.  Noted that will plan to intervene this first given her new habit of drinking alcohol at night.  She agrees to be refrain from alcohol use to avoid any drowsiness.  Noted that she has an upcoming appointment for sleep evaluation.   # Alcohol use She has started to drink moonshine, up to 2 glasses at night for insomnia.  She denies any craving for alcohol, and denies any history of alcohol use.  She agrees to refrain from alcohol use.  Will continue to assess.     Plan Continue venlafaxine 75 mg daily Continue quetiapine 100 mg at night (on metformin) 420 msec 10/2021 Start doxepin 10 mg at night as needed for insomnia Next appointment - 4/10 at 10 AM, IP - on Vagifem 10 mg, twice a week, inseritble - on Tramadol, gabapentin 300 mg tid,    Past trials of medication-  Paxil, mirtazapine, Latuda (insomnia), Abilify (worsening in anxiety, insomnia), Lamotrigine, Gabapentin, trazodone   The patient demonstrates the following risk factors  for suicide: Chronic risk factors for suicide include: psychiatric disorder of depression . Acute risk factors for suicide include: N/A. Protective factors for this patient include: positive social support, responsibility to others (children, family), coping skills, and hope for the future. Considering these factors, the overall suicide risk at this point appears to be low. Patient is appropriate for outpatient follow up.         Collaboration of Care: Collaboration of Care: Other reviewed notes in Epic  Patient/Guardian was advised Release of Information must be obtained prior to any record release in order to collaborate their care with an outside provider. Patient/Guardian was advised if they have not already done so to contact the registration department to sign all necessary forms in order for [name] to release information regarding their care.   Consent: Patient/Guardian gives verbal consent for treatment and assignment of benefits for services provided during this visit. Patient/Guardian expressed understanding and agreed to proceed.    [name], MD 05/30/2022, 9:36 AM

## 2022-05-28 NOTE — Telephone Encounter (Signed)
no answer no message could be left.

## 2022-05-29 ENCOUNTER — Telehealth (INDEPENDENT_AMBULATORY_CARE_PROVIDER_SITE_OTHER): Payer: BC Managed Care – PPO | Admitting: Pulmonary Disease

## 2022-05-29 DIAGNOSIS — G4733 Obstructive sleep apnea (adult) (pediatric): Secondary | ICD-10-CM | POA: Diagnosis not present

## 2022-05-29 NOTE — Telephone Encounter (Signed)
CPAP 11 cm required with med nasal pillows

## 2022-05-30 ENCOUNTER — Telehealth (INDEPENDENT_AMBULATORY_CARE_PROVIDER_SITE_OTHER): Payer: BC Managed Care – PPO | Admitting: Psychiatry

## 2022-05-30 ENCOUNTER — Encounter: Payer: Self-pay | Admitting: Psychiatry

## 2022-05-30 ENCOUNTER — Other Ambulatory Visit: Payer: Self-pay | Admitting: Psychiatry

## 2022-05-30 DIAGNOSIS — F3341 Major depressive disorder, recurrent, in partial remission: Secondary | ICD-10-CM

## 2022-05-30 DIAGNOSIS — F419 Anxiety disorder, unspecified: Secondary | ICD-10-CM

## 2022-05-30 DIAGNOSIS — G4700 Insomnia, unspecified: Secondary | ICD-10-CM | POA: Diagnosis not present

## 2022-05-30 MED ORDER — QUETIAPINE FUMARATE 100 MG PO TABS: 100.0000 mg | ORAL_TABLET | Freq: Every day | ORAL | 0 refills | Status: DC

## 2022-05-30 MED ORDER — DOXEPIN HCL 10 MG PO CAPS: 10.0000 mg | ORAL_CAPSULE | Freq: Every evening | ORAL | 1 refills | Status: DC | PRN

## 2022-05-30 MED ORDER — VENLAFAXINE HCL ER 75 MG PO CP24: 75.0000 mg | ORAL_CAPSULE | Freq: Every day | ORAL | 0 refills | Status: DC

## 2022-05-30 NOTE — Patient Instructions (Signed)
Continue venlafaxine 75 mg daily Continue quetiapine 100 mg at night  Start doxepin 10 mg at night as needed for insomnia Next appointment - 4/10 at 10 AM

## 2022-06-04 ENCOUNTER — Ambulatory Visit (INDEPENDENT_AMBULATORY_CARE_PROVIDER_SITE_OTHER): Payer: BC Managed Care – PPO | Admitting: Physician Assistant

## 2022-06-04 ENCOUNTER — Encounter: Payer: Self-pay | Admitting: Physician Assistant

## 2022-06-04 ENCOUNTER — Ambulatory Visit
Admission: RE | Admit: 2022-06-04 | Discharge: 2022-06-04 | Disposition: A | Discharge status: Home / Self Care | Payer: BC Managed Care – PPO | Source: Ambulatory Visit | Attending: Acute Care | Admitting: Acute Care

## 2022-06-04 DIAGNOSIS — Z87891 Personal history of nicotine dependence: Secondary | ICD-10-CM | POA: Insufficient documentation

## 2022-06-04 DIAGNOSIS — Z122 Encounter for screening for malignant neoplasm of respiratory organs: Secondary | ICD-10-CM | POA: Diagnosis not present

## 2022-06-04 DIAGNOSIS — F17210 Nicotine dependence, cigarettes, uncomplicated: Secondary | ICD-10-CM | POA: Diagnosis not present

## 2022-06-04 NOTE — Telephone Encounter (Signed)
Patient is aware of results and voiced her understanding.  She is currently using cpap. She would like to hold off on replacement cpap for now.  Order placed to [facility] for pressure change. Nothing further needed.

## 2022-06-04 NOTE — Progress Notes (Signed)
Virtual Visit via Telephone Note  I connected with Katelyn Cooper on 06/04/22 at 10:00 AM EST by telephone and verified that I am speaking with the correct person using two identifiers.  Location: Patient: home Provider: working remotely from home   I discussed the limitations, risks, security and privacy concerns of performing an evaluation and management service by telephone and the availability of in person appointments. I also discussed with the patient that there may be a patient responsible charge related to this service. The patient expressed understanding and agreed to proceed.     Shared Decision Making Visit Lung Cancer Screening Program [phone])   Eligibility: Age 67 y.o. Pack Years Smoking History Calculation 46 (# packs/per year x # years smoked) Recent History of coughing up blood  no Unexplained weight loss? no ( >Than 15 pounds within the last 6 months ) Prior History Lung / other cancer no (Diagnosis within the last 5 years already requiring surveillance chest CT Scans). Smoking Status Current Smoker   Visit Components: Discussion included one or more decision making aids. yes Discussion included risk/benefits of screening. yes Discussion included potential follow up diagnostic testing for abnormal scans. yes Discussion included meaning and risk of over diagnosis. yes Discussion included meaning and risk of False Positives. yes Discussion included meaning of total radiation exposure. yes  Counseling Included: Importance of adherence to annual lung cancer LDCT screening. yes Impact of comorbidities on ability to participate in the program. yes Ability and willingness to under diagnostic treatment. yes  Smoking Cessation Counseling: Current Smokers:  Discussed importance of smoking cessation. yes Information about tobacco cessation classes and interventions provided to patient. yes Patient provided with "ticket" for LDCT Scan. N/a Symptomatic  Patient. no  Counseling(Intermediate counseling: > three minutes) 99406 Diagnosis Code: Tobacco Use Z72.0 Asymptomatic Patient yes  Counseling (Intermediate counseling: > three minutes counseling) [id number]  Information about tobacco cessation classes and interventions provided to patient. Yes Written Order for Lung Cancer Screening with LDCT placed in Epic. Yes (CT Chest Lung Cancer Screening Low Dose W/O CM) [id number] Z12.2-Screening of respiratory organs Z87.891-Personal history of nicotine dependence    I have spent 25 minutes of face to face/ virtual visit   time with the patient discussing the risks and benefits of lung cancer screening. We viewed / discussed a power point together that explained in detail the above noted topics. We paused at intervals to allow for questions to be asked and answered to ensure understanding.We discussed that the single most powerful action that she can take to decrease her risk of developing lung cancer is to quit smoking. We discussed whether or not she is ready to commit to setting a quit date. We discussed options for tools to aid in quitting smoking including nicotine replacement therapy, non-nicotine medications, support groups, Quit Smart classes, and behavior modification. We discussed that often times setting smaller, more achievable goals, such as eliminating 1 cigarette a day for a week and then 2 cigarettes a day for a week can be helpful in slowly decreasing the number of cigarettes smoked. This allows for a sense of accomplishment as well as providing a clinical benefit. I provided  her  with smoking cessation  information  with contact information for community resources, classes, free nicotine replacement therapy, and access to mobile apps, text messaging, and on-line smoking cessation help. I have also provided  her  the office contact information in the event she needs to contact me, or the screening staff. We  discussed the time and location of the  scan, and that either Doroteo Glassman RN, Joella Prince, RN  or I will call / send a letter with the results within 24-72 hours of receiving them. The patient verbalized understanding of all of  the above and had no further questions upon leaving the office. They have my contact information in the event they have any further questions.  I spent three minutes counseling on smoking cessation and the health risks of continued tobacco abuse.  I explained to the patient that there has been a high incidence of coronary artery disease noted on these exams. I explained that this is a non-gated exam therefore degree or severity cannot be determined. This patient is on statin therapy. I have asked the patient to follow-up with their PCP regarding any incidental finding of coronary artery disease and management with diet or medication as their PCP  feels is clinically indicated. The patient verbalized understanding of the above and had no further questions upon completion of the visit.     Otilio Carpen Terren Jandreau, PA-C

## 2022-06-04 NOTE — Patient Instructions (Signed)
Thank you for participating in the [facility]. It was our pleasure to meet you today. We will call you with the results of your scan within the next few days. Your scan will be assigned a Lung RADS category score by the physicians reading the scans.  This Lung RADS score determines follow up scanning.  See below for description of categories, and follow up screening recommendations. We will be in touch to schedule your follow up screening annually or based on recommendations of our providers. We will fax a copy of your scan results to your Primary Care Physician, or the physician who referred you to the program, to ensure they have the results. Please call the office if you have any questions or concerns regarding your scanning experience or results.  Our office number is [phone]. Please speak with Denise Phelps, RN. , or  Denise Buckner RN, They are  our Lung Cancer Screening RN.'s If They are unavailable when you call, Please leave a message on the voice mail. We will return your call at our earliest convenience.This voice mail is monitored several times a day.  Remember, if your scan is normal, we will scan you annually as long as you continue to meet the criteria for the program. (Age 50-80, Current smoker or smoker who has quit within the last 15 years). If you are a smoker, remember, quitting is the single most powerful action that you can take to decrease your risk of lung cancer and other pulmonary, breathing related problems. We know quitting is hard, and we are here to help.  Please let us know if there is anything we can do to help you meet your goal of quitting. If you are a former smoker, congratulations. We are proud of you! Remain smoke free! Remember you can refer friends or family members through the number above.  We will screen them to make sure they meet criteria for the program. Thank you for helping us take better care of you by  participating in Lung Screening.  You can receive free nicotine replacement therapy ( patches, gum or mints) by calling 1-800-QUIT NOW. Please call so we can get you on the path to becoming  a non-smoker. I know it is hard, but you can do this!  Lung RADS Categories:  Lung RADS 1: no nodules or definitely non-concerning nodules.  Recommendation is for a repeat annual scan in 12 months.  Lung RADS 2:  nodules that are non-concerning in appearance and behavior with a very low likelihood of becoming an active cancer. Recommendation is for a repeat annual scan in 12 months.  Lung RADS 3: nodules that are probably non-concerning , includes nodules with a low likelihood of becoming an active cancer.  Recommendation is for a 6-month repeat screening scan. Often noted after an upper respiratory illness. We will be in touch to make sure you have no questions, and to schedule your 6-month scan.  Lung RADS 4 A: nodules with concerning findings, recommendation is most often for a follow up scan in 3 months or additional testing based on our provider's assessment of the scan. We will be in touch to make sure you have no questions and to schedule the recommended 3 month follow up scan.  Lung RADS 4 B:  indicates findings that are concerning. We will be in touch with you to schedule additional diagnostic testing based on our provider's  assessment of the scan.  Other options for assistance in smoking cessation (   As covered by your insurance benefits)  Hypnosis for smoking cessation  [employer] [phone]  Acupuncture for smoking cessation  [facility] [phone]   

## 2022-06-04 NOTE — Addendum Note (Signed)
Addended by: Claudette Head A on: 06/04/2022 02:55 PM   Modules accepted: Orders

## 2022-06-06 ENCOUNTER — Other Ambulatory Visit: Payer: Self-pay | Admitting: Acute Care

## 2022-06-06 DIAGNOSIS — Z122 Encounter for screening for malignant neoplasm of respiratory organs: Secondary | ICD-10-CM

## 2022-06-06 DIAGNOSIS — F17210 Nicotine dependence, cigarettes, uncomplicated: Secondary | ICD-10-CM

## 2022-06-06 DIAGNOSIS — Z87891 Personal history of nicotine dependence: Secondary | ICD-10-CM

## 2022-06-07 ENCOUNTER — Encounter: Payer: Self-pay | Admitting: Family Medicine

## 2022-06-07 DIAGNOSIS — J432 Centrilobular emphysema: Secondary | ICD-10-CM | POA: Insufficient documentation

## 2022-06-12 ENCOUNTER — Other Ambulatory Visit: Payer: Self-pay | Admitting: Family Medicine

## 2022-06-12 MED ORDER — MONTELUKAST SODIUM 10 MG PO TABS: ORAL_TABLET | ORAL | 1 refills | Status: DC

## 2022-06-12 MED ORDER — FLUTICASONE PROPIONATE 50 MCG/ACT NA SUSP: 2.0000 | Freq: Every day | NASAL | 1 refills | Status: DC

## 2022-06-12 NOTE — Telephone Encounter (Signed)
Requested Prescriptions  Pending Prescriptions Disp Refills   fluticasone (FLONASE) 50 MCG/ACT nasal spray 16 g 1    Sig: Place 2 sprays into both nostrils daily.     Ear, Nose, and Throat: Nasal Preparations - Corticosteroids Passed - 06/12/2022 10:40 AM      Passed - Valid encounter within last 12 months    Recent Outpatient Visits           4 months ago Controlled type 2 diabetes mellitus without complication, without long-term current use of insulin ([place])   [place], Donald E, MD   7 months ago PAD (peripheral artery disease) [facility])   [place], [place], PA-C   10 months ago Trapezius strain, left, sequela   [place], Donald E, MD   10 months ago Acute pain of left shoulder   [place], Dani Gobble, PA-C   1 year ago Acute vulvovaginitis   [place], [place], PA-C       Future Appointments             In 7 months Ralene Bathe, MD [place]             montelukast (SINGULAIR) 10 MG tablet 30 tablet 1     Pulmonology:  Leukotriene Inhibitors Passed - 06/12/2022 10:40 AM      Passed - Valid encounter within last 12 months    Recent Outpatient Visits           4 months ago Controlled type 2 diabetes mellitus without complication, without long-term current use of insulin ([place])   [place], Donald E, MD   7 months ago PAD (peripheral artery disease) [facility])   [place], [place], PA-C   10 months ago Trapezius strain, left, sequela   [place], Donald E, MD   10 months ago Acute pain of left shoulder   [place], Dani Gobble, PA-C   1 year ago Acute vulvovaginitis   [place], [place], PA-C       Future  Appointments             In 7 months Ralene Bathe, MD [place]

## 2022-06-12 NOTE — Telephone Encounter (Signed)
Medication Refill - Medication: montelukast (SINGULAIR) 10 MG tablet [id number]   fluticasone (FLONASE) 50 MCG/ACT nasal spray [id number]   Has the patient contacted their pharmacy? Yes.   (Agent: If no, request that the patient contact the pharmacy for the refill. If patient does not wish to contact the pharmacy document the reason why and proceed with request.) (Agent: If yes, when and what did the pharmacy advise?)  Preferred Pharmacy (with phone number or street name): [id number] [place] [place]: [phone] Fax: [phone] Has the patient been seen for an appointment in the last year OR does the patient have an upcoming appointment? Yes.    Agent: Please be advised that RX refills may take up to 3 business days. We ask that you follow-up with your pharmacy.

## 2022-06-18 ENCOUNTER — Telehealth: Payer: Self-pay

## 2022-06-18 NOTE — Telephone Encounter (Signed)
Could you advise her to try doxepin, total of 20 mg, which is 2 caps of 10 mg to see if it works better? Please let me know if she needs a refill. Thanks.

## 2022-06-18 NOTE — Telephone Encounter (Signed)
pt left a message that the sleeping medication must not be strong enough because it still takes her 45 minutes or more to get to sleep. wanted to know if the dosage needs to go up or if she needs something else.

## 2022-06-19 ENCOUNTER — Other Ambulatory Visit: Payer: Self-pay | Admitting: Psychiatry

## 2022-06-19 MED ORDER — DOXEPIN HCL 10 MG PO CAPS: 20.0000 mg | ORAL_CAPSULE | Freq: Every evening | ORAL | 0 refills | Status: DC | PRN

## 2022-06-19 NOTE — Telephone Encounter (Signed)
The order has been sent. [facility], and cancel the 10 mg order. Thanks.

## 2022-06-19 NOTE — Telephone Encounter (Signed)
Called patients pharmacy spoke to McIntyre he cancelled the Rx for Doxepin for a qty of 30

## 2022-06-19 NOTE — Telephone Encounter (Signed)
Spoke to patient to advise of the message she is ok with taking the Doxepin 20 mg she will need a refill please advise

## 2022-06-20 ENCOUNTER — Ambulatory Visit: Payer: BC Managed Care – PPO

## 2022-06-30 ENCOUNTER — Other Ambulatory Visit: Payer: Self-pay | Admitting: Internal Medicine
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
## 2022-07-10 ENCOUNTER — Other Ambulatory Visit: Payer: Self-pay | Admitting: Psychiatry

## 2022-07-11 ENCOUNTER — Encounter: Payer: Self-pay | Admitting: Psychiatry

## 2022-07-11 ENCOUNTER — Telehealth (INDEPENDENT_AMBULATORY_CARE_PROVIDER_SITE_OTHER): Payer: BC Managed Care – PPO | Admitting: Psychiatry

## 2022-07-11 DIAGNOSIS — F419 Anxiety disorder, unspecified: Secondary | ICD-10-CM | POA: Diagnosis not present

## 2022-07-11 DIAGNOSIS — G4700 Insomnia, unspecified: Secondary | ICD-10-CM | POA: Diagnosis not present

## 2022-07-11 DIAGNOSIS — F3341 Major depressive disorder, recurrent, in partial remission: Secondary | ICD-10-CM

## 2022-07-11 MED ORDER — DOXEPIN HCL 10 MG PO CAPS: 20.0000 mg | ORAL_CAPSULE | Freq: Every evening | ORAL | 2 refills | Status: DC | PRN

## 2022-07-11 NOTE — Patient Instructions (Signed)
Continue venlafaxine 75 mg daily Continue quetiapine 100 mg at night  Continue doxepin 20 mg at night as needed for insomnia Next appointment- 7/8 at 8 am

## 2022-07-30 ENCOUNTER — Ambulatory Visit: Payer: BC Managed Care – PPO | Attending: Physician Assistant

## 2022-07-30 ENCOUNTER — Ambulatory Visit (INDEPENDENT_AMBULATORY_CARE_PROVIDER_SITE_OTHER): Payer: BC Managed Care – PPO

## 2022-07-30 DIAGNOSIS — I2510 Atherosclerotic heart disease of native coronary artery without angina pectoris: Secondary | ICD-10-CM

## 2022-07-30 DIAGNOSIS — I739 Peripheral vascular disease, unspecified: Secondary | ICD-10-CM

## 2022-07-30 DIAGNOSIS — Z95828 Presence of other vascular implants and grafts: Secondary | ICD-10-CM

## 2022-07-30 LAB — VAS US ABI WITH/WO TBI
Left ABI: 0.12
Right ABI: 1.11

## 2022-07-31 ENCOUNTER — Other Ambulatory Visit: Payer: Self-pay | Admitting: Family Medicine

## 2022-08-06 DIAGNOSIS — H2511 Age-related nuclear cataract, right eye: Secondary | ICD-10-CM | POA: Diagnosis not present

## 2022-08-17 DIAGNOSIS — G4733 Obstructive sleep apnea (adult) (pediatric): Secondary | ICD-10-CM | POA: Diagnosis not present

## 2022-08-21 ENCOUNTER — Other Ambulatory Visit: Payer: Self-pay | Admitting: Psychiatry

## 2022-08-21 DIAGNOSIS — F3341 Major depressive disorder, recurrent, in partial remission: Secondary | ICD-10-CM

## 2022-08-24 DIAGNOSIS — G4733 Obstructive sleep apnea (adult) (pediatric): Secondary | ICD-10-CM | POA: Diagnosis not present

## 2022-08-24 DIAGNOSIS — H2511 Age-related nuclear cataract, right eye: Secondary | ICD-10-CM | POA: Diagnosis not present

## 2022-08-31 DIAGNOSIS — M5412 Radiculopathy, cervical region: Secondary | ICD-10-CM | POA: Diagnosis not present

## 2022-09-17 ENCOUNTER — Telehealth: Payer: Self-pay

## 2022-09-17 ENCOUNTER — Other Ambulatory Visit: Payer: Self-pay | Admitting: Psychiatry

## 2022-09-17 DIAGNOSIS — F3341 Major depressive disorder, recurrent, in partial remission: Secondary | ICD-10-CM

## 2022-09-17 MED ORDER — QUETIAPINE FUMARATE 100 MG PO TABS: 100.0000 mg | ORAL_TABLET | Freq: Every day | ORAL | 0 refills | Status: DC

## 2022-09-17 NOTE — Telephone Encounter (Signed)
Pt.notified

## 2022-09-17 NOTE — Telephone Encounter (Signed)
Ordered

## 2022-09-17 NOTE — Telephone Encounter (Signed)
pt called left message that she needs a refill on the quetiapine. pt last seen on 4-10 next appt 7-8

## 2022-09-24 ENCOUNTER — Telehealth: Payer: Self-pay | Admitting: Internal Medicine

## 2022-09-24 NOTE — Telephone Encounter (Signed)
Pt was curious to see if she can get a inhaler sent in

## 2022-09-26 MED ORDER — ALBUTEROL SULFATE HFA 108 (90 BASE) MCG/ACT IN AERS: INHALATION_SPRAY | RESPIRATORY_TRACT | 6 refills | Status: AC

## 2022-09-26 MED ORDER — ALBUTEROL SULFATE HFA 108 (90 BASE) MCG/ACT IN AERS: INHALATION_SPRAY | RESPIRATORY_TRACT | 6 refills | Status: DC

## 2022-09-26 NOTE — Telephone Encounter (Signed)
I spoke with the patient. She is needing a refill on her Albuterol inhaler. I have sent the refill into [facility] on [address]. And the patient is aware.  Nothing further needed.
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
## 2022-10-08 ENCOUNTER — Encounter: Payer: Self-pay | Admitting: Psychiatry

## 2022-10-08 ENCOUNTER — Ambulatory Visit (INDEPENDENT_AMBULATORY_CARE_PROVIDER_SITE_OTHER): Payer: BC Managed Care – PPO | Admitting: Psychiatry

## 2022-10-08 VITALS — BP 95/58 | HR 89 | Temp 97.0°F | Ht 63.0 in | Wt 156.6 lb

## 2022-10-08 DIAGNOSIS — F419 Anxiety disorder, unspecified: Secondary | ICD-10-CM | POA: Diagnosis not present

## 2022-10-08 DIAGNOSIS — G4700 Insomnia, unspecified: Secondary | ICD-10-CM | POA: Diagnosis not present

## 2022-10-08 DIAGNOSIS — F3341 Major depressive disorder, recurrent, in partial remission: Secondary | ICD-10-CM

## 2022-10-08 MED ORDER — DOXEPIN HCL 10 MG PO CAPS: 20.0000 mg | ORAL_CAPSULE | Freq: Every evening | ORAL | 0 refills | Status: DC | PRN

## 2022-10-08 MED ORDER — QUETIAPINE FUMARATE 25 MG PO TABS: 75.0000 mg | ORAL_TABLET | Freq: Every day | ORAL | 0 refills | Status: DC

## 2022-10-12 DIAGNOSIS — M7062 Trochanteric bursitis, left hip: Secondary | ICD-10-CM | POA: Diagnosis not present

## 2022-10-13 ENCOUNTER — Other Ambulatory Visit: Payer: Self-pay | Admitting: Family Medicine

## 2022-10-14 ENCOUNTER — Other Ambulatory Visit: Payer: Self-pay | Admitting: Psychiatry

## 2022-10-14 ENCOUNTER — Other Ambulatory Visit: Payer: Self-pay | Admitting: Family Medicine

## 2022-10-15 ENCOUNTER — Telehealth: Payer: Self-pay

## 2022-10-15 DIAGNOSIS — Z720 Tobacco use: Secondary | ICD-10-CM

## 2022-10-15 MED ORDER — VARENICLINE TARTRATE (STARTER) 0.5 MG X 11 & 1 MG X 42 PO TBPK: ORAL_TABLET | ORAL | 0 refills | Status: DC

## 2022-10-15 NOTE — Telephone Encounter (Signed)
[facility]. She is overdue for office visit to follow up sugar and medications. Needs to be seen before any additional refills can be approved.

## 2022-10-15 NOTE — Addendum Note (Signed)
Addended by: Malva Limes on: 10/15/2022 12:39 PM   Modules accepted: Orders

## 2022-10-15 NOTE — Telephone Encounter (Signed)
Copied from CRM [id number]. Topic: General - Other >> Oct 15, 2022 10:34 AM Phill Myron wrote: Ms Adcox is requesting a medication, that was given to her a few years back, to stop her from smoking. She has recently started back smoking and would like to try that medication again. Please advise

## 2022-10-23 ENCOUNTER — Other Ambulatory Visit: Payer: Self-pay | Admitting: Psychiatry

## 2022-10-23 DIAGNOSIS — F3341 Major depressive disorder, recurrent, in partial remission: Secondary | ICD-10-CM

## 2022-10-24 ENCOUNTER — Ambulatory Visit
Admission: RE | Admit: 2022-10-24 | Discharge: 2022-10-24 | Disposition: A | Discharge status: Home / Self Care | Payer: BC Managed Care – PPO | Attending: Family Medicine | Admitting: Family Medicine

## 2022-10-24 ENCOUNTER — Encounter: Payer: Self-pay | Admitting: Family Medicine

## 2022-10-24 ENCOUNTER — Ambulatory Visit (INDEPENDENT_AMBULATORY_CARE_PROVIDER_SITE_OTHER): Payer: BC Managed Care – PPO | Admitting: Family Medicine

## 2022-10-24 ENCOUNTER — Ambulatory Visit
Admission: RE | Admit: 2022-10-24 | Discharge: 2022-10-24 | Disposition: A | Discharge status: Home / Self Care | Payer: BC Managed Care – PPO | Source: Ambulatory Visit | Attending: Family Medicine | Admitting: Family Medicine

## 2022-10-24 VITALS — BP 91/58 | HR 66 | Temp 97.7°F | Resp 16 | Ht 63.0 in | Wt 155.6 lb

## 2022-10-24 DIAGNOSIS — I471 Supraventricular tachycardia, unspecified: Secondary | ICD-10-CM

## 2022-10-24 DIAGNOSIS — E1151 Type 2 diabetes mellitus with diabetic peripheral angiopathy without gangrene: Secondary | ICD-10-CM | POA: Diagnosis not present

## 2022-10-24 DIAGNOSIS — R052 Subacute cough: Secondary | ICD-10-CM

## 2022-10-24 DIAGNOSIS — I2510 Atherosclerotic heart disease of native coronary artery without angina pectoris: Secondary | ICD-10-CM

## 2022-10-24 DIAGNOSIS — R059 Cough, unspecified: Secondary | ICD-10-CM | POA: Diagnosis not present

## 2022-10-24 LAB — POCT GLYCOSYLATED HEMOGLOBIN (HGB A1C)
Est. average glucose Bld gHb Est-mCnc: 151
Hemoglobin A1C: 6.9 % — AB (ref 4.0–5.6)

## 2022-10-24 NOTE — Patient Instructions (Signed)
.   Please review the attached list of medications and notify my office if there are any errors.   . Please bring all of your medications to every appointment so we can make sure that our medication list is the same as yours.   

## 2022-10-29 NOTE — Progress Notes (Signed)
Established patient visit   Patient: Katelyn Cooper   DOB: 30-Nov-1955   67 y.o. Female  MRN: [id number] Visit Date: 10/24/2022  Today's healthcare provider: Mila Merry, MD   Chief Complaint  Patient presents with   Diabetes   Subjective    HPI Lab Results  Component Value Date   HGBA1C 6.9 (A) 10/24/2022   HGBA1C 6.5 (A) 01/30/2022   HGBA1C 6.6 (A) 04/24/2021   Doing well on metformin, tolerating well.   She does report some cough and difficulty breathing last weekend for which she took albuterol which was effective. No fevers chills or sweats, still has  bit of a cough, but no longer short of breath.   Medications: Outpatient Medications Prior to Visit  Medication Sig   acetaminophen (TYLENOL) 500 MG tablet Take 500 mg by mouth every 6 (six) hours as needed.   albuterol (VENTOLIN HFA) 108 (90 Base) MCG/ACT inhaler inhale 2 puffs by mouth every 6 hours if needed for wheezing or shortness of breath   aspirin EC 81 MG tablet Take 1 tablet (81 mg total) by mouth daily.   atorvastatin (LIPITOR) 80 MG tablet Take 1 tablet by mouth daily.   b complex vitamins capsule Take 1 capsule by mouth daily.   Cholecalciferol (VITAMIN D3) 5000 units TABS Take 1 tablet by mouth daily.    doxepin (SINEQUAN) 10 MG capsule Take 2 capsules (20 mg total) by mouth at bedtime as needed.   ezetimibe (ZETIA) 10 MG tablet Take 1 tablet (10 mg total) by mouth daily.   fluticasone (FLONASE) 50 MCG/ACT nasal spray SPRAY 2 SPRAYS INTO EACH NOSTRIL EVERY DAY   furosemide (LASIX) 20 MG tablet Take 1 tablet (20 mg total) by mouth every other day. Take 1 tablet by mouth daily as needed for increased fluid or weight gain.   glucose blood test strip ONETOUCH ULTRA MINI STRIPS AND LANCETS. Use one daily to check blood sugar.   lansoprazole (PREVACID) 15 MG capsule Take 15 mg by mouth daily at 6 (six) AM.   metFORMIN (GLUCOPHAGE-XR) 500 MG 24 hr tablet Take 1 tablet by mouth every evening. **Generic  equivalent for Glucophage-XR.**   metoprolol tartrate (LOPRESSOR) 25 MG tablet Take 1 tablet by mouth twice daily.   montelukast (SINGULAIR) 10 MG tablet TAKE 1 TABLET BY MOUTH EVERYDAY AT BEDTIME   Multiple Vitamins-Minerals (MULTI-VITAMIN GUMMIES PO) Take by mouth. Taking 1 daily   oxyCODONE (OXY IR/ROXICODONE) 5 MG immediate release tablet Take 5 mg by mouth every 4 (four) hours as needed.   potassium chloride SA (KLOR-CON M) 20 MEQ tablet Take 2 tablets by mouth once daily only on days you are taking Lasix. Please call [phone] to schedule an appointment.   QUEtiapine (SEROQUEL) 25 MG tablet Take 3 tablets (75 mg total) by mouth at bedtime.   Varenicline Tartrate, Starter, (CHANTIX STARTING MONTH PAK) 0.5 MG X 11 & 1 MG X 42 TBPK Take one 0.5 mg tab by mouth once daily for 3 days, then take one 0.5 mg tab twice daily for 4 days, then take one 1 mg tab twice daily.   venlafaxine XR (EFFEXOR-XR) 75 MG 24 hr capsule Take 1 capsule (75 mg total) by mouth daily with breakfast.   doxepin (SINEQUAN) 10 MG capsule Take 1 capsule (10 mg total) by mouth at bedtime as needed (insomnia).   [DISCONTINUED] methocarbamol (ROBAXIN) 500 MG tablet Take 500 mg by mouth 2 (two) times daily.   [DISCONTINUED] QUEtiapine (SEROQUEL) 100  MG tablet Take 1 tablet (100 mg total) by mouth at bedtime.   No facility-administered medications prior to visit.   (optional):1}   Objective    BP (!) 91/58 (BP Location: Left Arm, Patient Position: Sitting, Cuff Size: Normal)   Pulse 66   Temp 97.7 F (36.5 C) (Temporal)   Resp 16   Ht 5\' 3"  (1.6 m)   Wt 155 lb 9.6 oz (70.6 kg)   SpO2 98%   BMI 27.56 kg/m    Physical Exam   General: Appearance:    Well developed, well nourished female in no acute distress  Eyes:    PERRL, conjunctiva/corneas clear, EOM's intact       Lungs:     Occasional expiratory wheezes, respirations unlabored  Heart:    Normal heart rate. Normal rhythm. No murmurs, rubs, or gallops.    MS:    All extremities are intact.    Neurologic:   Awake, alert, oriented x 3. No apparent focal neurological defect.         Results for orders placed or performed in visit on 10/24/22  POCT glycosylated hemoglobin (Hb A1C)  Result Value Ref Range   Hemoglobin A1C 6.9 (A) 4.0 - 5.6 %   Est. average glucose Bld gHb Est-mCnc 151     Assessment & Plan     1. Type 2 diabetes mellitus with diabetic peripheral angiopathy without gangrene, without long-term current use of insulin (HCC) Well controlled.  Continue current medications.    2. CAD in native artery Asymptomatic. Compliant with medication.  Continue aggressive risk factor modification.    3. PSVT (paroxysmal supraventricular tachycardia) Well controlled.   4. Subacute cough Improving, continue inhaler prn - DG Chest 2 View; Future      Mila Merry, MD  [facility] [phone] (phone) [phone] (fax)  [facility]

## 2022-11-02 ENCOUNTER — Telehealth: Payer: Self-pay | Admitting: Internal Medicine

## 2022-11-02 DIAGNOSIS — Z79899 Other long term (current) drug therapy: Secondary | ICD-10-CM

## 2022-11-02 MED ORDER — POTASSIUM CHLORIDE 10 MEQ PO PACK: 20.0000 meq | PACK | Freq: Every day | ORAL | 3 refills | Status: DC

## 2022-11-02 NOTE — Telephone Encounter (Signed)
Patient advised of providers recommendations and verbalized understanding. Medication sent to pharmacy of patients request. Patient has no further questions at this time.

## 2022-11-02 NOTE — Telephone Encounter (Signed)
Please discontinue KCl tab.  Start KCl powder 20 mEq, take only when taking furosemide.  Follow-up BMP 2 weeks after transition of potassium supplementation.  If she has issues with powder, could use Micro-K capsule.

## 2022-11-02 NOTE — Telephone Encounter (Signed)
Patient states she is unable to swallow potassium pills anymore as they get stuck in her throat and make her gag. Patient states she has tried cutting them in half but that makes it worse with the rough edges. Patient advised to try to take the potassium until we hear from the provider because stopping it completely while on lasix can cause her to have a low potassium level. Patient advised the only alternative is if the provider recommends and orders an oral powder prescription of potassium. Patient verbalized understanding.

## 2022-11-02 NOTE — Telephone Encounter (Signed)
Pt c/o medication issue:  1. Name of Medication:   potassium chloride SA (KLOR-CON M) 20 MEQ tablet   2. How are you currently taking this medication (dosage and times per day)?   As prescribed  3. Are you having a reaction (difficulty breathing--STAT)?   4. What is your medication issue?   Patient stated these tablets are very large and is causing her to gag.  Patient stated she wants to stop taking these tablets and get alternate medication.

## 2022-11-06 ENCOUNTER — Encounter: Payer: Self-pay | Admitting: Internal Medicine

## 2022-11-08 ENCOUNTER — Telehealth: Payer: Self-pay | Admitting: Pharmacy Technician

## 2022-11-08 ENCOUNTER — Other Ambulatory Visit (HOSPITAL_COMMUNITY): Payer: Self-pay

## 2022-11-08 NOTE — Telephone Encounter (Signed)
Pharmacy Patient Advocate Encounter   Received notification from CoverMyMeds that prior authorization for Pokonza packets is required/requested.   Insurance verification completed.   The patient is insured through [facility] .   Per test claim: PA required; PA submitted to [facility] via CoverMyMeds Key/confirmation #/EOC [id number] Status is pending

## 2022-11-08 NOTE — Telephone Encounter (Signed)
Pharmacy Patient Advocate Encounter   Received notification from CoverMyMeds that prior authorization for Pokonza packets is required/requested.   Insurance verification completed.   The patient is insured through [facility] .   Per test claim: PA required; PA started via CoverMyMeds. KEY [id number] . Waiting for clinical questions to populate.

## 2022-11-09 NOTE — Telephone Encounter (Signed)
Pt c/o medication issue:  1. Name of Medication:   Pokonza packets   2. How are you currently taking this medication (dosage and times per day)?   3. Are you having a reaction (difficulty breathing--STAT)?   4. What is your medication issue?    Patient stated she received notification from her insurance company that they would not pay for this medication because patient will need a new prescription.  Patient stated she will need need this medication because she cannot swallow the large tablets.  Patient wants call back on status of this request.

## 2022-11-13 ENCOUNTER — Other Ambulatory Visit (HOSPITAL_COMMUNITY): Payer: Self-pay

## 2022-11-13 NOTE — Telephone Encounter (Signed)
 PA request has been Submitted. New Encounter created for follow up. For additional info see Pharmacy Prior Auth telephone encounter from 11/08/22.

## 2022-11-14 ENCOUNTER — Other Ambulatory Visit (HOSPITAL_COMMUNITY): Payer: Self-pay

## 2022-11-14 NOTE — Telephone Encounter (Signed)
Yes there are two other options at no cost to the patient per test claim-  micro-k (potassium capsules) and potassium solution are both covered under patient's plan.

## 2022-11-14 NOTE — Telephone Encounter (Signed)
Pharmacy Patient Advocate Encounter  Received notification from [facility] that Prior Authorization for Pokonza packets has been DENIED. Please advise how you'd like to proceed. Full denial letter will be uploaded to the media tab. See denial reason below.  Denied PA #/Case ID/Reference #: [id number]

## 2022-11-15 ENCOUNTER — Other Ambulatory Visit: Payer: Self-pay

## 2022-11-15 MED ORDER — POTASSIUM CHLORIDE 20 MEQ/15ML (10%) PO SOLN: 10.0000 meq | Freq: Every day | ORAL | 6 refills | Status: DC | PRN

## 2022-11-15 NOTE — Telephone Encounter (Signed)
Okay for KCl solution 10 mEq daily as needed with furosemide.

## 2022-11-15 NOTE — Telephone Encounter (Signed)
Order placed for potassium chloride 10 Meq solution per patient request and sent to pharmacy of choice
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
## 2022-11-22 ENCOUNTER — Other Ambulatory Visit: Payer: Self-pay | Admitting: Family Medicine

## 2022-11-23 ENCOUNTER — Other Ambulatory Visit: Payer: Self-pay | Admitting: Psychiatry

## 2022-11-23 DIAGNOSIS — F3341 Major depressive disorder, recurrent, in partial remission: Secondary | ICD-10-CM

## 2022-11-23 NOTE — Telephone Encounter (Signed)
Requested Prescriptions  Pending Prescriptions Disp Refills   fluticasone (FLONASE) 50 MCG/ACT nasal spray [Pharmacy Med Name: FLUTICASONE PROP 50 MCG SPRAY] 48 mL 0    Sig: SPRAY 2 SPRAYS INTO EACH NOSTRIL EVERY DAY     Ear, Nose, and Throat: Nasal Preparations - Corticosteroids Passed - 11/22/2022  1:34 PM      Passed - Valid encounter within last 12 months    Recent Outpatient Visits           1 month ago Type 2 diabetes mellitus with diabetic peripheral angiopathy without gangrene, without long-term current use of insulin (HCC)   [place] [facility] Malva Limes, MD   9 months ago Controlled type 2 diabetes mellitus without complication, without long-term current use of insulin (HCC)   [place] [facility] Malva Limes, MD   1 year ago PAD (peripheral artery disease) [facility])   [place] [facility] [place], Nora, PA-C   1 year ago Trapezius strain, left, sequela   [place] [facility] Sherrie Mustache, Demetrios Isaacs, MD   1 year ago Acute pain of left shoulder   [place] [facility] Mecum, Oswaldo Conroy, PA-C       Future Appointments             In 2 months Deirdre Evener, MD [facility]   In 3 months Fisher, Demetrios Isaacs, MD [place] [facility]             montelukast (SINGULAIR) 10 MG tablet [Pharmacy Med Name: MONTELUKAST SOD 10 MG TABLET] 90 tablet 0    Sig: TAKE 1 TABLET BY MOUTH EVERYDAY AT BEDTIME     Pulmonology:  Leukotriene Inhibitors Passed - 11/22/2022  1:34 PM      Passed - Valid encounter within last 12 months    Recent Outpatient Visits           1 month ago Type 2 diabetes mellitus with diabetic peripheral angiopathy without gangrene, without long-term current use of insulin (HCC)   [place] [facility] Malva Limes, MD   9 months ago Controlled type 2 diabetes mellitus without complication, without long-term  current use of insulin (HCC)   [place] [facility] Malva Limes, MD   1 year ago PAD (peripheral artery disease) [facility])   [place] [facility] Byron, [place], PA-C   1 year ago Trapezius strain, left, sequela   [place] [facility] Malva Limes, MD   1 year ago Acute pain of left shoulder    [facility] Mecum, Oswaldo Conroy, PA-C       Future Appointments             In 2 months Deirdre Evener, MD [facility]   In 3 months Fisher, Demetrios Isaacs, MD [facility]

## 2022-11-26 ENCOUNTER — Ambulatory Visit (INDEPENDENT_AMBULATORY_CARE_PROVIDER_SITE_OTHER): Payer: BC Managed Care – PPO | Admitting: Psychiatry

## 2022-11-26 ENCOUNTER — Encounter: Payer: Self-pay | Admitting: Psychiatry

## 2022-11-26 VITALS — BP 90/56 | HR 88 | Temp 96.7°F | Ht 63.0 in | Wt 156.0 lb

## 2022-11-26 DIAGNOSIS — G4700 Insomnia, unspecified: Secondary | ICD-10-CM | POA: Diagnosis not present

## 2022-11-26 DIAGNOSIS — F419 Anxiety disorder, unspecified: Secondary | ICD-10-CM

## 2022-11-26 DIAGNOSIS — F3341 Major depressive disorder, recurrent, in partial remission: Secondary | ICD-10-CM

## 2022-11-26 MED ORDER — QUETIAPINE FUMARATE 100 MG PO TABS: 100.0000 mg | ORAL_TABLET | Freq: Every day | ORAL | 0 refills | Status: DC

## 2022-11-26 NOTE — Patient Instructions (Addendum)
Continue venlafaxine 75 mg daily Increase quetiapine 100 mg at night  Continue doxepin 20 mg at night as needed for insomnia Next appointment- 10/14 at 11 am

## 2022-11-30 DIAGNOSIS — M7062 Trochanteric bursitis, left hip: Secondary | ICD-10-CM | POA: Diagnosis not present

## 2022-12-10 ENCOUNTER — Other Ambulatory Visit: Payer: Self-pay | Admitting: Obstetrics and Gynecology

## 2022-12-10 DIAGNOSIS — M8580 Other specified disorders of bone density and structure, unspecified site: Secondary | ICD-10-CM

## 2022-12-12 ENCOUNTER — Other Ambulatory Visit: Payer: Self-pay | Admitting: Family Medicine

## 2022-12-12 DIAGNOSIS — E119 Type 2 diabetes mellitus without complications: Secondary | ICD-10-CM

## 2022-12-15 ENCOUNTER — Other Ambulatory Visit: Payer: Self-pay | Admitting: Psychiatry

## 2022-12-28 ENCOUNTER — Ambulatory Visit
Admission: RE | Admit: 2022-12-28 | Discharge: 2022-12-28 | Disposition: A | Discharge status: Home / Self Care | Payer: BC Managed Care – PPO | Source: Ambulatory Visit | Attending: Student in an Organized Health Care Education/Training Program | Admitting: Student in an Organized Health Care Education/Training Program

## 2022-12-28 ENCOUNTER — Encounter: Payer: Self-pay | Admitting: Student in an Organized Health Care Education/Training Program

## 2022-12-28 ENCOUNTER — Ambulatory Visit (INDEPENDENT_AMBULATORY_CARE_PROVIDER_SITE_OTHER): Payer: BC Managed Care – PPO | Admitting: Student in an Organized Health Care Education/Training Program

## 2022-12-28 VITALS — BP 112/64 | HR 108 | Temp 98.0°F | Ht 63.0 in | Wt 153.4 lb

## 2022-12-28 DIAGNOSIS — R0602 Shortness of breath: Secondary | ICD-10-CM

## 2022-12-28 DIAGNOSIS — I700 Atherosclerosis of aorta: Secondary | ICD-10-CM | POA: Diagnosis not present

## 2022-12-28 DIAGNOSIS — Z981 Arthrodesis status: Secondary | ICD-10-CM | POA: Diagnosis not present

## 2022-12-28 NOTE — Progress Notes (Signed)
Assessment & Plan:   1. Shortness of breath  Patient is presenting for the evaluation of shortness of breath with physical exam that is completely benign.  Her lungs are clear and there is no wheeze, rales, or rhonchi.  She is not hypoxic and her vital signs are otherwise within normal aside from mild tachycardia.  I discussed with the patient that overall description of symptoms and physical exam is benign and very reassuring.  I do not suspect a COPD exacerbation or other obstructive lung disease exacerbation at this point.  I have reviewed her pulmonary function testing from 2017 which showed normal spirometry and lung volumes but did have a mild decrease in her DLCO.  Today, I will obtain a chest x-ray to rule out any pneumothorax, pneumonia, or increased interstitial opacities that could suggest heart failure.  I will also update her pulmonary function testing and have asked the patient to follow-up with Dr. Belia Heman following the repeat PFTs.  Also, I congratulated her on smoking cessation and encouraged her to continue to abstain from smoking.  We also discussed breathing techniques as the patient reports experiencing significant anxiety.  - Pulmonary Function Test [facility] Only; Future - DG Chest 2 View; Future   Return in about 3 months (around 03/29/2023).  I spent 30 minutes caring for this patient today, including preparing to see the patient, obtaining a medical history , reviewing a separately obtained history, performing a medically appropriate examination and/or evaluation, counseling and educating the patient/family/caregiver, ordering medications, tests, or procedures, and documenting clinical information in the electronic health record  Raechel Chute, MD [facility] 12/28/2022 9:55 AM    End of visit medications:  No orders of the defined types were placed in this encounter.    Current Outpatient Medications:    acetaminophen (TYLENOL) 500 MG  tablet, Take 500 mg by mouth every 6 (six) hours as needed., Disp: , Rfl:    albuterol (VENTOLIN HFA) 108 (90 Base) MCG/ACT inhaler, inhale 2 puffs by mouth every 6 hours if needed for wheezing or shortness of breath, Disp: 18 g, Rfl: 6   aspirin EC 81 MG tablet, Take 1 tablet (81 mg total) by mouth daily., Disp: , Rfl:    atorvastatin (LIPITOR) 80 MG tablet, Take 1 tablet by mouth daily., Disp: 90 tablet, Rfl: 2   b complex vitamins capsule, Take 1 capsule by mouth daily., Disp: , Rfl:    Cholecalciferol (VITAMIN D3) 5000 units TABS, Take 1 tablet by mouth daily. , Disp: , Rfl: 0   doxepin (SINEQUAN) 10 MG capsule, Take 2 capsules (20 mg total) by mouth at bedtime as needed., Disp: 180 capsule, Rfl: 0   ezetimibe (ZETIA) 10 MG tablet, Take 1 tablet (10 mg total) by mouth daily., Disp: 90 tablet, Rfl: 3   fluticasone (FLONASE) 50 MCG/ACT nasal spray, SPRAY 2 SPRAYS INTO EACH NOSTRIL EVERY DAY, Disp: 48 mL, Rfl: 0   furosemide (LASIX) 20 MG tablet, Take 1 tablet (20 mg total) by mouth every other day. Take 1 tablet by mouth daily as needed for increased fluid or weight gain., Disp: 90 tablet, Rfl: 3   glucose blood test strip, ONETOUCH ULTRA MINI STRIPS AND LANCETS. Use one daily to check blood sugar., Disp: 100 each, Rfl: 3   lansoprazole (PREVACID) 15 MG capsule, Take 15 mg by mouth daily at 6 (six) AM., Disp: , Rfl:    metFORMIN (GLUCOPHAGE-XR) 500 MG 24 hr tablet, Take 1 tablet by mouth every evening.  Generic equivalent for Glucophage-XR., Disp: 90 tablet, Rfl: 1   metoprolol tartrate (LOPRESSOR) 25 MG tablet, Take 1 tablet by mouth twice daily., Disp: 180 tablet, Rfl: 3   montelukast (SINGULAIR) 10 MG tablet, TAKE 1 TABLET BY MOUTH EVERYDAY AT BEDTIME, Disp: 90 tablet, Rfl: 0   Multiple Vitamins-Minerals (MULTI-VITAMIN GUMMIES PO), Take by mouth. Taking 1 daily, Disp: , Rfl:    potassium chloride 20 MEQ/15ML (10%) SOLN, Take 7.5 mLs (10 mEq total) by mouth daily as needed (with furosemide).,  Disp: 473 mL, Rfl: 6   QUEtiapine (SEROQUEL) 100 MG tablet, Take 1 tablet (100 mg total) by mouth at bedtime., Disp: 90 tablet, Rfl: 0   Varenicline Tartrate, Starter, (CHANTIX STARTING MONTH PAK) 0.5 MG X 11 & 1 MG X 42 TBPK, Take one 0.5 mg tab by mouth once daily for 3 days, then take one 0.5 mg tab twice daily for 4 days, then take one 1 mg tab twice daily., Disp: 53 each, Rfl: 0   venlafaxine XR (EFFEXOR-XR) 75 MG 24 hr capsule, Take 1 capsule (75 mg total) by mouth daily with breakfast., Disp: 90 capsule, Rfl: 1   Subjective:   PATIENT ID: Katelyn Cooper GENDER: female DOB: [birth year], MRN: [id number]  Chief Complaint  Patient presents with   Follow-up    Shortness of breath with exertion and rest.     HPI  Patient is a pleasant 67 year old female, followed by Dr. Belia Heman, who presents today for an acute visit.  Patient is presenting today because of acute onset shortness of breath that started a few days ago.  Patient reports that she quit smoking mid August after which her anxiety increased.  In September, she had her COVID-vaccine after which she experienced significant shortness of breath.  This persisted for a few days prompting her to call 911.  On arrival, emergency medical services did not feel that the patient needed to be taken to the emergency department and recommended that she remain at home.  She feels that her symptoms have persisted and she feels short of breath with activity as well as at rest.  Patient denies any symptoms of chest pain, wheezing, chest tightness, cough, sputum production, hemoptysis, fevers, chills, night sweats, or weight loss.  Her appetite has been altered following smoking cessation.  She is also been followed by her psychiatrist and attempts at decreasing her quetiapine dose failed secondary to significant increase in anxiety.  Patient followed by Dr. Belia Heman for OSA and presumed COPD.  She is on as needed albuterol which has not had any effect on her  symptoms.  She is also reenrolled in our lung cancer screening program, most recent CT scan was in March 2024.  Last PFTs were in 2017.  Ancillary information including prior medications, full medical/surgical/family/social histories, and PFTs (when available) are listed below and have been reviewed.   Review of Systems  Constitutional:  Negative for chills, fever and weight loss.  Respiratory:  Positive for shortness of breath. Negative for cough, hemoptysis, sputum production and wheezing.   Cardiovascular:  Negative for chest pain, palpitations, leg swelling and PND.     Objective:   Vitals:   12/28/22 0928  BP: 112/64  Pulse: (!) 108  Temp: 98 F (36.7 C)  TempSrc: Temporal  SpO2: 96%  Weight: 153 lb 6.4 oz (69.6 kg)  Height: 5\' 3"  (1.6 m)   96% on RA BMI Readings from Last 3 Encounters:  12/28/22 27.17 kg/m  10/24/22 27.56 kg/m  05/08/22 26.43 kg/m   Wt Readings from Last 3 Encounters:  12/28/22 153 lb 6.4 oz (69.6 kg)  10/24/22 155 lb 9.6 oz (70.6 kg)  05/08/22 149 lb 3.2 oz (67.7 kg)    Physical Exam Constitutional:      Appearance: Normal appearance.  Cardiovascular:     Rate and Rhythm: Normal rate and regular rhythm.     Pulses: Normal pulses.     Heart sounds: Normal heart sounds.  Pulmonary:     Effort: Pulmonary effort is normal.     Breath sounds: Normal breath sounds.  Abdominal:     General: Abdomen is flat.     Palpations: Abdomen is soft.  Musculoskeletal:     Right lower leg: No edema.     Left lower leg: No edema.  Neurological:     General: No focal deficit present.     Mental Status: She is alert and oriented to person, place, and time. Mental status is at baseline.     Ancillary Information    Past Medical History:  Diagnosis Date   (HFpEF) heart failure with preserved ejection fraction (HCC)    a. 12/2015 Echo: EF 55-60%, nl RV size/fxn, no significant valvular abnormalities; b. 10/2017 Echo: EF 50-55%, antsept, ant HK. Gr2 DD.  Mild MR. Nl RV fxn. PASP .   Anxiety    Basal cell carcinoma 08/08/2017   L nose above mid nasal alar groove   CHF (congestive heart failure) (HCC)    Depression    Diabetes type 2, controlled (HCC)    diet-controlled   Esophageal stricture    GERD (gastroesophageal reflux disease)    Hiatal hernia    History of chicken pox    Hyperlipidemia    Hypertension    Internal hemorrhoids    Non-obstructive CAD (coronary artery disease)    a. 12/2015 MV: apical ant and apical reversible defect. EF 55-65%; b. 01/2016 Cath: LM nl, LAD nl, SP1 40ost, LCX nl, OM1 30, RCA 30p/m, EDP 15-78mmHg; c. 10/2017 Cath: LM nl, LAD nl, SP1 30ost, LCX nl, OM1 30, RCA 30p/m. EF 55%.   Obstructive sleep apnea    PAD (peripheral artery disease) (HCC)    a. 1990's s/p prior LCIA stenting; b. 12/2015 ABI: R 1.05, L 1.03.   Panic disorder      Family History  Problem Relation Age of Onset   Lung cancer Mother        was a smoker   Emphysema Mother    Cancer Mother        Lung   Alcohol abuse Mother    Depression Mother    Heart disease Mother    Drug abuse Sister    Breast cancer Sister        x 2  (30&50)   Emphysema Sister    Bipolar disorder Sister    Alcohol abuse Other    Depression Other    Bipolar disorder Other    Heart disease Other    Vision loss Other    Alcohol abuse Father    Mood Disorder Father    Heart disease Maternal Grandmother    Stroke Maternal Grandmother    Esophageal cancer Maternal Aunt    Colon cancer Neg Hx    Colon polyps Neg Hx    Rectal cancer Neg Hx    Stomach cancer Neg Hx      Past Surgical History:  Procedure Laterality Date   AORTA SURGERY  1999   stent placement; ?  left iliac artery intervention   APPENDECTOMY  1998   BASAL CELL CARCINOMA EXCISION     C5 - fusion N/A 10/10/2021   CARDIAC CATHETERIZATION N/A 01/12/2016   Procedure: Left Heart Cath and Coronary Angiography;  Surgeon: Yvonne Kendall, MD;  Location: [facility] INVASIVE CV LAB;  Service:  Cardiovascular;  Laterality: N/A;   COLONOSCOPY  07/17/2019   FOOT SURGERY     Right   pap smear  03/2010   Done by Dr. Osborn Coho, normal   REFRACTIVE SURGERY     right   RIB RESECTION  [id number]   RIGHT/LEFT HEART CATH AND CORONARY ANGIOGRAPHY N/A 11/08/2017   Procedure: RIGHT/LEFT HEART CATH AND CORONARY ANGIOGRAPHY;  Surgeon: Iran Ouch, MD;  Location: [facility] INVASIVE CV LAB;  Service: Cardiovascular;  Laterality: N/A;   SHOULDER SURGERY  2005   left   UPPER GASTROINTESTINAL ENDOSCOPY  07/17/2019   UPPER GI ENDOSCOPY  09/19/2011   Stricture at GE junction, dilated. Mild gastritis and duodenitis. Small hiatal hernia    Social History   Socioeconomic History   Marital status: Married    Spouse name: mark   Number of children: 0   Years of education: Not on file   Highest education level: High school graduate  Occupational History   Occupation: computer-retired    Employer: [employer]  Tobacco Use   Smoking status: Former    Current packs/day: 0.00    Types: Cigarettes    Quit date: 11/16/2022    Years since quitting: 0.1   Smokeless tobacco: Never  Vaping Use   Vaping status: Former  Substance and Sexual Activity   Alcohol use: No    Alcohol/week: 0.0 standard drinks of alcohol   Drug use: No   Sexual activity: Not Currently  Other Topics Concern   Not on file  Social History Narrative   Not on file   Social Determinants of Health   Financial Resource Strain: Low Risk  (12/12/2021)   Overall Financial Resource Strain (CARDIA)    Difficulty of Paying Living Expenses: Not hard at all  Food Insecurity: No Food Insecurity (12/12/2021)   Hunger Vital Sign    Worried About Running Out of Food in the Last Year: Never true    Ran Out of Food in the Last Year: Never true  Transportation Needs: No Transportation Needs (12/12/2021)   PRAPARE - Administrator, Civil Service (Medical): No    Lack of Transportation (Non-Medical): No  Physical Activity:  Inactive (12/12/2021)   Exercise Vital Sign    Days of Exercise per Week: 0 days    Minutes of Exercise per Session: 0 min  Stress: No Stress Concern Present (12/12/2021)   [employer] of Occupational Health - Occupational Stress Questionnaire    Feeling of Stress : Only a little  Social Connections: Moderately Isolated (03/04/2017)   Social Connection and Isolation Panel [NHANES]    Frequency of Communication with Friends and Family: Once a week    Frequency of Social Gatherings with Friends and Family: Never    Attends Religious Services: Never    Database administrator or Organizations: No    Attends Banker Meetings: Never    Marital Status: Married  Catering manager Violence: Not At Risk (12/12/2021)   Humiliation, Afraid, Rape, and Kick questionnaire    Fear of Current or Ex-Partner: No    Emotionally Abused: No    Physically Abused: No    Sexually Abused: No  Allergies  Allergen Reactions   Lovastatin     depression   Paroxetine Hcl     itching   Lamotrigine Rash     CBC    Component Value Date/Time   WBC 11.1 (H) 10/31/2021 1515   WBC 7.9 10/19/2021 0406   RBC 4.81 10/31/2021 1515   RBC 4.47 10/19/2021 0406   HGB 14.6 10/31/2021 1515   HCT 43.0 10/31/2021 1515   PLT 275 10/31/2021 1515   MCV 89 10/31/2021 1515   MCH 30.4 10/31/2021 1515   MCH 29.5 10/19/2021 0406   MCHC 34.0 10/31/2021 1515   MCHC 32.5 10/19/2021 0406   RDW 13.8 10/31/2021 1515   LYMPHSABS 3.0 10/31/2021 1515   MONOABS 0.7 10/18/2021 0117   EOSABS 0.3 10/31/2021 1515   BASOSABS 0.1 10/31/2021 1515    Pulmonary Functions Testing Results:    Latest Ref Rng & Units 08/19/2015    3:16 PM  PFT Results  FVC-Pre L 2.63  P  FVC-Predicted Pre % 83  P  FVC-Post L 2.63  P  FVC-Predicted Post % 83  P  Pre FEV1/FVC % % 80  P  Post FEV1/FCV % % 80  P  FEV1-Pre L 2.12  P  FEV1-Predicted Pre % 87  P  FEV1-Post L 2.11  P  DLCO uncorrected ml/min/mmHg 14.08  P  DLCO UNC% %  61  P  DLVA Predicted % 68  P  TLC L 5.02  P  TLC % Predicted % 102  P  RV % Predicted % 109  P    P Preliminary result    Outpatient Medications Prior to Visit  Medication Sig Dispense Refill   acetaminophen (TYLENOL) 500 MG tablet Take 500 mg by mouth every 6 (six) hours as needed.     albuterol (VENTOLIN HFA) 108 (90 Base) MCG/ACT inhaler inhale 2 puffs by mouth every 6 hours if needed for wheezing or shortness of breath 18 g 6   aspirin EC 81 MG tablet Take 1 tablet (81 mg total) by mouth daily.     atorvastatin (LIPITOR) 80 MG tablet Take 1 tablet by mouth daily. 90 tablet 2   b complex vitamins capsule Take 1 capsule by mouth daily.     Cholecalciferol (VITAMIN D3) 5000 units TABS Take 1 tablet by mouth daily.   0   doxepin (SINEQUAN) 10 MG capsule Take 2 capsules (20 mg total) by mouth at bedtime as needed. 180 capsule 0   ezetimibe (ZETIA) 10 MG tablet Take 1 tablet (10 mg total) by mouth daily. 90 tablet 3   fluticasone (FLONASE) 50 MCG/ACT nasal spray SPRAY 2 SPRAYS INTO EACH NOSTRIL EVERY DAY 48 mL 0   furosemide (LASIX) 20 MG tablet Take 1 tablet (20 mg total) by mouth every other day. Take 1 tablet by mouth daily as needed for increased fluid or weight gain. 90 tablet 3   glucose blood test strip ONETOUCH ULTRA MINI STRIPS AND LANCETS. Use one daily to check blood sugar. 100 each 3   lansoprazole (PREVACID) 15 MG capsule Take 15 mg by mouth daily at 6 (six) AM.     metFORMIN (GLUCOPHAGE-XR) 500 MG 24 hr tablet Take 1 tablet by mouth every evening. Generic equivalent for Glucophage-XR. 90 tablet 1   metoprolol tartrate (LOPRESSOR) 25 MG tablet Take 1 tablet by mouth twice daily. 180 tablet 3   montelukast (SINGULAIR) 10 MG tablet TAKE 1 TABLET BY MOUTH EVERYDAY AT BEDTIME 90 tablet 0   Multiple Vitamins-Minerals (  MULTI-VITAMIN GUMMIES PO) Take by mouth. Taking 1 daily     potassium chloride 20 MEQ/15ML (10%) SOLN Take 7.5 mLs (10 mEq total) by mouth daily as needed (with  furosemide). 473 mL 6   QUEtiapine (SEROQUEL) 100 MG tablet Take 1 tablet (100 mg total) by mouth at bedtime. 90 tablet 0   Varenicline Tartrate, Starter, (CHANTIX STARTING MONTH PAK) 0.5 MG X 11 & 1 MG X 42 TBPK Take one 0.5 mg tab by mouth once daily for 3 days, then take one 0.5 mg tab twice daily for 4 days, then take one 1 mg tab twice daily. 53 each 0   venlafaxine XR (EFFEXOR-XR) 75 MG 24 hr capsule Take 1 capsule (75 mg total) by mouth daily with breakfast. 90 capsule 1   oxyCODONE (OXY IR/ROXICODONE) 5 MG immediate release tablet Take 5 mg by mouth every 4 (four) hours as needed.     QUEtiapine (SEROQUEL) 25 MG tablet Take 3 tablets (75 mg total) by mouth at bedtime. 270 tablet 0   No facility-administered medications prior to visit.

## 2023-01-07 DIAGNOSIS — G4733 Obstructive sleep apnea (adult) (pediatric): Secondary | ICD-10-CM | POA: Diagnosis not present
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
## 2023-01-14 ENCOUNTER — Encounter: Payer: Self-pay | Admitting: Psychiatry

## 2023-01-14 ENCOUNTER — Ambulatory Visit: Payer: BC Managed Care – PPO | Admitting: Psychiatry

## 2023-01-14 VITALS — BP 104/62 | HR 77 | Temp 97.6°F | Ht 63.0 in | Wt 162.6 lb

## 2023-01-14 DIAGNOSIS — G4700 Insomnia, unspecified: Secondary | ICD-10-CM

## 2023-01-14 DIAGNOSIS — F419 Anxiety disorder, unspecified: Secondary | ICD-10-CM | POA: Diagnosis not present

## 2023-01-14 DIAGNOSIS — F3341 Major depressive disorder, recurrent, in partial remission: Secondary | ICD-10-CM

## 2023-01-14 MED ORDER — QUETIAPINE FUMARATE 100 MG PO TABS: 100.0000 mg | ORAL_TABLET | Freq: Every day | ORAL | 0 refills | Status: DC

## 2023-01-14 MED ORDER — DOXEPIN HCL 10 MG PO CAPS: 20.0000 mg | ORAL_CAPSULE | Freq: Every evening | ORAL | 0 refills | Status: DC | PRN

## 2023-01-14 NOTE — Patient Instructions (Signed)
Continue venlafaxine 75 mg daily Continue quetiapine 100 mg at night Continue doxepin 20 mg at night as needed for insomnia Next appointment- 11/26 at 11 am

## 2023-01-23 ENCOUNTER — Ambulatory Visit: Payer: BC Managed Care – PPO | Admitting: Dermatology

## 2023-01-23 ENCOUNTER — Encounter: Payer: Self-pay | Admitting: Dermatology

## 2023-01-23 VITALS — BP 101/57

## 2023-01-23 DIAGNOSIS — L918 Other hypertrophic disorders of the skin: Secondary | ICD-10-CM | POA: Diagnosis not present

## 2023-01-23 DIAGNOSIS — L814 Other melanin hyperpigmentation: Secondary | ICD-10-CM | POA: Diagnosis not present

## 2023-01-23 DIAGNOSIS — Z872 Personal history of diseases of the skin and subcutaneous tissue: Secondary | ICD-10-CM

## 2023-01-23 DIAGNOSIS — L821 Other seborrheic keratosis: Secondary | ICD-10-CM

## 2023-01-23 DIAGNOSIS — L820 Inflamed seborrheic keratosis: Secondary | ICD-10-CM

## 2023-01-23 DIAGNOSIS — W908XXA Exposure to other nonionizing radiation, initial encounter: Secondary | ICD-10-CM

## 2023-01-23 DIAGNOSIS — Z1283 Encounter for screening for malignant neoplasm of skin: Secondary | ICD-10-CM | POA: Diagnosis not present

## 2023-01-23 DIAGNOSIS — Z85828 Personal history of other malignant neoplasm of skin: Secondary | ICD-10-CM

## 2023-01-23 DIAGNOSIS — D1801 Hemangioma of skin and subcutaneous tissue: Secondary | ICD-10-CM

## 2023-01-23 DIAGNOSIS — L578 Other skin changes due to chronic exposure to nonionizing radiation: Secondary | ICD-10-CM

## 2023-01-23 DIAGNOSIS — D229 Melanocytic nevi, unspecified: Secondary | ICD-10-CM

## 2023-01-23 DIAGNOSIS — D692 Other nonthrombocytopenic purpura: Secondary | ICD-10-CM

## 2023-01-23 NOTE — Progress Notes (Signed)
Follow-Up Visit   Subjective  Katelyn Cooper is a 67 y.o. female who presents for the following: Skin Cancer Screening and Full Body Skin Exam hx of BCC, Aks, ISKs L preauricular txted last visit not fully resolved  The patient presents for Total-Body Skin Exam (TBSE) for skin cancer screening and mole check. The patient has spots, moles and lesions to be evaluated, some may be new or changing and the patient may have concern these could be cancer.    The following portions of the chart were reviewed this encounter and updated as appropriate: medications, allergies, medical history  Review of Systems:  No other skin or systemic complaints except as noted in HPI or Assessment and Plan.  Objective  Well appearing patient in no apparent distress; mood and affect are within normal limits.  A full examination was performed including scalp, head, eyes, ears, nose, lips, neck, chest, axillae, abdomen, back, buttocks, bilateral upper extremities, bilateral lower extremities, hands, feet, fingers, toes, fingernails, and toenails. All findings within normal limits unless otherwise noted below.   Relevant physical exam findings are noted in the Assessment and Plan.  L sideburn area x 2, forehead x 3, R clavicle x 1 (6) Stuck on waxy paps with erythema  R neck x 2 (2) Fleshy paps    Assessment & Plan   SKIN CANCER SCREENING PERFORMED TODAY.  ACTINIC DAMAGE - Chronic condition, secondary to cumulative UV/sun exposure - diffuse scaly erythematous macules with underlying dyspigmentation - Recommend daily broad spectrum sunscreen SPF 30+ to sun-exposed areas, reapply every 2 hours as needed.  - Staying in the shade or wearing long sleeves, sun glasses (UVA+UVB protection) and wide brim hats (4-inch brim around the entire circumference of the hat) are also recommended for sun protection.  - Call for new or changing lesions.  LENTIGINES, SEBORRHEIC KERATOSES, HEMANGIOMAS - Benign normal  skin lesions - Benign-appearing - Call for any changes  MELANOCYTIC NEVI - Tan-brown and/or pink-flesh-colored symmetric macules and papules - Benign appearing on exam today - Observation - Call clinic for new or changing moles - Recommend daily use of broad spectrum spf 30+ sunscreen to sun-exposed areas.   HISTORY OF BASAL CELL CARCINOMA OF THE SKIN - No evidence of recurrence today - Recommend regular full body skin exams - Recommend daily broad spectrum sunscreen SPF 30+ to sun-exposed areas, reapply every 2 hours as needed.  - Call if any new or changing lesions are noted between office visits  - L nose above nasal alar groove  Purpura - Chronic; persistent and recurrent.  Treatable, but not curable. arms - Violaceous macules and patches - Benign - Related to trauma, age, sun damage and/or use of blood thinners, chronic use of topical and/or oral steroids - Observe - Can use OTC arnica containing moisturizer such as Dermend Bruise Formula if desired - Call for worsening or other concerns  Inflamed seborrheic keratosis (6) L sideburn area x 2, forehead x 3, R clavicle x 1  Symptomatic, irritating, patient would like treated.  Destruction of lesion - L sideburn area x 2, forehead x 3, R clavicle x 1 (6) Complexity: simple   Destruction method: cryotherapy   Informed consent: discussed and consent obtained   Timeout:  patient name, date of birth, surgical site, and procedure verified Lesion destroyed using liquid nitrogen: Yes   Region frozen until ice ball extended beyond lesion: Yes   Outcome: patient tolerated procedure well with no complications   Post-procedure details: wound care instructions  given    Skin tag (2) R neck x 2  Symptomatic, irritating, patient would like treated.   Destruction of lesion - R neck x 2 (2) Complexity: simple   Destruction method: cryotherapy   Informed consent: discussed and consent obtained   Timeout:  patient name, date of  birth, surgical site, and procedure verified Lesion destroyed using liquid nitrogen: Yes   Region frozen until ice ball extended beyond lesion: Yes   Outcome: patient tolerated procedure well with no complications   Post-procedure details: wound care instructions given     Return in about 1 year (around 01/23/2024) for TBSE, Hx of BCC, Hx of AKs.  I, Ardis Rowan, RMA, am acting as scribe for Armida Sans, MD .   Documentation: I have reviewed the above documentation for accuracy and completeness, and I agree with the above.  Armida Sans, MD

## 2023-01-23 NOTE — Patient Instructions (Addendum)
Cryotherapy Aftercare  Wash gently with soap and water everyday.   Apply Vaseline and Band-Aid daily until healed.   For bruising on arms Dermend Bruise Formula  Due to recent changes in healthcare laws, you may see results of your pathology and/or laboratory studies on MyChart before the doctors have had a chance to review them. We understand that in some cases there may be results that are confusing or concerning to you. Please understand that not all results are received at the same time and often the doctors may need to interpret multiple results in order to provide you with the best plan of care or course of treatment. Therefore, we ask that you please give [name] 2 business days to thoroughly review all your results before contacting the office for clarification. Should we see a critical lab result, you will be contacted sooner.   If You Need Anything After Your Visit  If you have any questions or concerns for your doctor, please call our main line at [phone] and press option 4 to reach your doctor's medical assistant. If no one answers, please leave a voicemail as directed and we will return your call as soon as possible. Messages left after 4 pm will be answered the following business day.   You may also send [name] a message via MyChart. We typically respond to MyChart messages within 1-2 business days.  For prescription refills, please ask your pharmacy to contact our office. Our fax number is [phone].  If you have an urgent issue when the clinic is closed that cannot wait until the next business day, you can page your doctor at the number below.    Please note that while we do our best to be available for urgent issues outside of office hours, we are not available 24/7.   If you have an urgent issue and are unable to reach [name], you may choose to seek medical care at your doctor's office, retail clinic, urgent care center, or emergency room.  If you have a medical emergency,  please immediately call 911 or go to the emergency department.  Pager Numbers  - Dr. Gwen Pounds: [phone]  - Dr. Roseanne Reno: [phone]  - Dr. Katrinka Blazing: [phone]   In the event of inclement weather, please call our main line at [phone] for an update on the status of any delays or closures.  Dermatology Medication Tips: Please keep the boxes that topical medications come in in order to help keep track of the instructions about where and how to use these. Pharmacies typically print the medication instructions only on the boxes and not directly on the medication tubes.   If your medication is too expensive, please contact our office at [phone] option 4 or send [name] a message through MyChart.   We are unable to tell what your co-pay for medications will be in advance as this is different depending on your insurance coverage. However, we may be able to find a substitute medication at lower cost or fill out paperwork to get insurance to cover a needed medication.   If a prior authorization is required to get your medication covered by your insurance company, please allow [name] 1-2 business days to complete this process.  Drug prices often vary depending on where the prescription is filled and some pharmacies may offer cheaper prices.  The website www.goodrx.com contains coupons for medications through different pharmacies. The prices here do not account for what the cost may be with help from insurance (it may be cheaper  with your insurance), but the website can give you the price if you did not use any insurance.  - You can print the associated coupon and take it with your prescription to the pharmacy.  - You may also stop by our office during regular business hours and pick up a GoodRx coupon card.  - If you need your prescription sent electronically to a different pharmacy, notify our office through [facility] or by phone at [phone] option 4.     Si Usted Necesita  Algo Despus de Su Visita  Tambin puede enviarnos un mensaje a travs de Clinical cytogeneticist. Por lo general respondemos a los mensajes de MyChart en el transcurso de 1 a 2 das hbiles.  Para renovar recetas, por favor pida a su farmacia que se ponga en contacto con nuestra oficina. Annie Sable de fax es [place] [phone].  Si tiene un asunto urgente cuando la clnica est cerrada y que no puede esperar hasta el siguiente da hbil, puede llamar/localizar a su doctor(a) al nmero que aparece a continuacin.   Por favor, tenga en cuenta que aunque hacemos todo lo posible para estar disponibles para asuntos urgentes fuera del horario de [place], no estamos disponibles las 24 horas del da, los 7 [address]  [address] de la [place].   Si tiene un problema urgente y no puede comunicarse con nosotros, puede optar por buscar atencin mdica  en el consultorio de su doctor(a), en una clnica privada, en un centro de atencin urgente o en una sala de emergencias.  Si tiene Engineer, drilling, por favor llame inmediatamente al 911 o vaya a la sala de emergencias.  Nmeros de bper  - Dr. Gwen Pounds: [phone]  - Dra. Roseanne Reno: [phone]  - Dr. Katrinka Blazing: [phone]   En caso de inclemencias del tiempo, por favor llame a Lacy Duverney principal al [phone] para una actualizacin sobre el [place] de cualquier retraso o cierre.  Consejos para la medicacin en dermatologa: Por favor, guarde las cajas en las que vienen los medicamentos de uso tpico para ayudarle a seguir las instrucciones sobre dnde y cmo usarlos. Las farmacias generalmente imprimen las instrucciones del medicamento slo en las cajas y no directamente en los tubos del [place].   Si su medicamento es muy caro, por favor, pngase en contacto con Rolm Gala llamando al [phone] y presione la opcin 4 o envenos un mensaje a travs de Clinical cytogeneticist.   No podemos decirle cul ser su copago por los medicamentos por adelantado ya que esto es  diferente dependiendo de la cobertura de su seguro. Sin embargo, es posible que podamos encontrar un medicamento sustituto a Audiological scientist un formulario para que el seguro cubra el medicamento que se considera necesario.   Si se requiere una autorizacin previa para que su compaa de seguros [place] su medicamento, por favor permtanos de 1 a 2 das hbiles para completar [address]  Los precios de los medicamentos varan con frecuencia dependiendo del Environmental consultant de dnde se surte la receta y alguna farmacias pueden ofrecer precios ms baratos.  El sitio web www.goodrx.com tiene cupones para medicamentos de Health and safety inspector. Los precios aqu no tienen en cuenta lo que podra costar con la ayuda del seguro (puede ser ms barato con su seguro), pero el sitio web puede darle el precio si no utiliz Tourist information centre manager.  - Puede imprimir el cupn correspondiente y llevarlo con su receta a la farmacia.  - Tambin puede pasar por nuestra oficina durante el horario de atencin regular y Education officer, museum  una tarjeta de cupones de GoodRx.  - Si necesita que su receta se enve electrnicamente a una farmacia diferente, informe a nuestra oficina a travs de MyChart de  o por telfono llamando al [phone] y presione la opcin 4.

## 2023-01-31 DIAGNOSIS — Z1231 Encounter for screening mammogram for malignant neoplasm of breast: Secondary | ICD-10-CM | POA: Diagnosis not present

## 2023-01-31 DIAGNOSIS — N761 Subacute and chronic vaginitis: Secondary | ICD-10-CM | POA: Diagnosis not present

## 2023-01-31 DIAGNOSIS — Z6824 Body mass index (BMI) 24.0-24.9, adult: Secondary | ICD-10-CM | POA: Diagnosis not present

## 2023-01-31 DIAGNOSIS — Z124 Encounter for screening for malignant neoplasm of cervix: Secondary | ICD-10-CM | POA: Diagnosis not present

## 2023-01-31 DIAGNOSIS — Z01419 Encounter for gynecological examination (general) (routine) without abnormal findings: Secondary | ICD-10-CM | POA: Diagnosis not present

## 2023-02-19 DIAGNOSIS — R9230 Dense breasts, unspecified: Secondary | ICD-10-CM | POA: Diagnosis not present

## 2023-02-20 ENCOUNTER — Other Ambulatory Visit: Payer: Self-pay | Admitting: Family Medicine

## 2023-02-21 ENCOUNTER — Other Ambulatory Visit: Payer: Self-pay | Admitting: Family Medicine

## 2023-02-22 DIAGNOSIS — R9230 Dense breasts, unspecified: Secondary | ICD-10-CM | POA: Diagnosis not present

## 2023-02-22 NOTE — Telephone Encounter (Signed)
Requested Prescriptions  Pending Prescriptions Disp Refills   fluticasone (FLONASE) 50 MCG/ACT nasal spray [Pharmacy Med Name: FLUTICASONE PROP 50 MCG SPRAY] 48 mL 0    Sig: SPRAY 2 SPRAYS INTO EACH NOSTRIL EVERY DAY     Ear, Nose, and Throat: Nasal Preparations - Corticosteroids Passed - 02/21/2023  1:29 AM      Passed - Valid encounter within last 12 months    Recent Outpatient Visits           4 months ago Type 2 diabetes mellitus with diabetic peripheral angiopathy without gangrene, without long-term current use of insulin (HCC)   [place] [facility] Malva Limes, MD   1 year ago Controlled type 2 diabetes mellitus without complication, without long-term current use of insulin (HCC)   [place] [facility] Malva Limes, MD   1 year ago PAD (peripheral artery disease) [facility])   [facility] [place], [place], PA-C   1 year ago Trapezius strain, left, sequela   [place] [facility] Malva Limes, MD   1 year ago Acute pain of left shoulder    [facility] Mecum, Oswaldo Conroy, PA-C       Future Appointments             In 3 days Fisher, Demetrios Isaacs, MD [facility]   In 2 weeks Erin Fulling, MD [facility] [place]   In 11 months Deirdre Evener, MD [facility]
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
## 2023-02-25 ENCOUNTER — Ambulatory Visit: Payer: BC Managed Care – PPO | Admitting: Family Medicine

## 2023-02-25 ENCOUNTER — Encounter: Payer: Self-pay | Admitting: Family Medicine

## 2023-02-25 VITALS — BP 128/70 | HR 76 | Resp 16 | Ht 63.0 in | Wt 158.6 lb

## 2023-02-25 DIAGNOSIS — E559 Vitamin D deficiency, unspecified: Secondary | ICD-10-CM

## 2023-02-25 DIAGNOSIS — R0609 Other forms of dyspnea: Secondary | ICD-10-CM

## 2023-02-25 DIAGNOSIS — Z7984 Long term (current) use of oral hypoglycemic drugs: Secondary | ICD-10-CM

## 2023-02-25 DIAGNOSIS — E1151 Type 2 diabetes mellitus with diabetic peripheral angiopathy without gangrene: Secondary | ICD-10-CM

## 2023-02-25 DIAGNOSIS — I2510 Atherosclerotic heart disease of native coronary artery without angina pectoris: Secondary | ICD-10-CM | POA: Diagnosis not present

## 2023-02-25 DIAGNOSIS — I739 Peripheral vascular disease, unspecified: Secondary | ICD-10-CM | POA: Diagnosis not present

## 2023-02-25 DIAGNOSIS — I10 Essential (primary) hypertension: Secondary | ICD-10-CM

## 2023-02-25 NOTE — Progress Notes (Signed)
Established patient visit   Patient: Katelyn Cooper   DOB: [birth year]   67 y.o. Female  MRN: [id number] Visit Date: 02/25/2023  Today's healthcare provider: Mila Merry, MD   Chief Complaint  Patient presents with   Medical Management of Chronic Issues   Subjective    Discussed the use of AI scribe software for clinical note transcription with the patient, who gave verbal consent to proceed.  History of Present Illness   The patient, with a history of diabetes, hyperlipidemia, CAD, diabetes, HFpEF, and COPD presents for a routine check-up. She reports no issues with the recently added cholesterol medication, ezetimibe, and denies any side effects. She has not been monitoring her blood glucose levels at home, but is compliant with her metformin regimen.   She last saw Eula Listen for cardiac disease and had ezetimibe add to her statin in February but not had lipids rechecked since then.  The patient has been experiencing some nasal congestion, but it is managed with a spray and pills. She also reports shortness of breath, for which she is using a nightly breathing machine and an inhaler as prescribed by her pulmonologist. The pulmonologist has suggested further testing, but this has not yet been conducted.  The patient has been eating regularly, with a recent meal consisting of a meal replacement shake, a fruit bar, and a stick of cheese. She has not reported any numbness or tingling in her extremities. She is compliant with her medication regimen, which is now being managed through [employer].  The patient has a history of smoking for fifty years, but the impact on her current respiratory symptoms is unclear. She is proactive about her health, attending appointments as scheduled and taking her medications as prescribed.       Medications: Outpatient Medications Prior to Visit  Medication Sig   acetaminophen (TYLENOL) 500 MG tablet Take 500 mg by mouth every 6 (six) hours as  needed.   albuterol (VENTOLIN HFA) 108 (90 Base) MCG/ACT inhaler inhale 2 puffs by mouth every 6 hours if needed for wheezing or shortness of breath   aspirin EC 81 MG tablet Take 1 tablet (81 mg total) by mouth daily.   atorvastatin (LIPITOR) 80 MG tablet Take 1 tablet by mouth daily.   b complex vitamins capsule Take 1 capsule by mouth daily.   Cholecalciferol (VITAMIN D3) 5000 units TABS Take 1 tablet by mouth daily.    doxepin (SINEQUAN) 10 MG capsule Take 2 capsules (20 mg total) by mouth at bedtime as needed.   fluticasone (FLONASE) 50 MCG/ACT nasal spray SPRAY 2 SPRAYS INTO EACH NOSTRIL EVERY DAY   furosemide (LASIX) 20 MG tablet Take 1 tablet (20 mg total) by mouth every other day. Take 1 tablet by mouth daily as needed for increased fluid or weight gain.   glucose blood test strip ONETOUCH ULTRA MINI STRIPS AND LANCETS. Use one daily to check blood sugar.   lansoprazole (PREVACID) 15 MG capsule Take 15 mg by mouth daily at 6 (six) AM.   metFORMIN (GLUCOPHAGE-XR) 500 MG 24 hr tablet Take 1 tablet by mouth every evening. Generic equivalent for Glucophage-XR.   metoprolol tartrate (LOPRESSOR) 25 MG tablet Take 1 tablet by mouth twice daily.   montelukast (SINGULAIR) 10 MG tablet TAKE 1 TABLET BY MOUTH EVERYDAY AT BEDTIME   Multiple Vitamins-Minerals (MULTI-VITAMIN GUMMIES PO) Take by mouth. Taking 1 daily   potassium chloride 20 MEQ/15ML (10%) SOLN Take 7.5 mLs (10 mEq total) by mouth  daily as needed (with furosemide).   QUEtiapine (SEROQUEL) 100 MG tablet Take 1 tablet (100 mg total) by mouth at bedtime.   venlafaxine XR (EFFEXOR-XR) 75 MG 24 hr capsule Take 1 capsule (75 mg total) by mouth daily with breakfast.   ezetimibe (ZETIA) 10 MG tablet Take 1 tablet (10 mg total) by mouth daily.   Varenicline Tartrate, Starter, (CHANTIX STARTING MONTH PAK) 0.5 MG X 11 & 1 MG X 42 TBPK Take one 0.5 mg tab by mouth once daily for 3 days, then take one 0.5 mg tab twice daily for 4 days, then take one  1 mg tab twice daily.   No facility-administered medications prior to visit.   Review of Systems     Objective    BP 128/70 (BP Location: Left Arm, Patient Position: Sitting, Cuff Size: Normal)   Pulse 76   Resp 16   Ht 5\' 3"  (1.6 m)   Wt 158 lb 9.6 oz (71.9 kg)   BMI 28.09 kg/m   Physical Exam   General: Appearance:    Well developed, well nourished female in no acute distress  Eyes:    PERRL, conjunctiva/corneas clear, EOM's intact       Lungs:     Clear to auscultation bilaterally, respirations unlabored  Heart:    Normal heart rate. Normal rhythm. No murmurs, rubs, or gallops.    MS:   All extremities are intact.    Neurologic:   Awake, alert, oriented x 3. No apparent focal neurological defect.         Assessment & Plan       CAD/CHF -no active cardiac symptoms. Continue routine follow up cardiology.    Hyperlipidemia Recently started on Ezetimibe by cardiologist. No reported side effects. -Check lipid panel today.  Type 2 Diabetes Mellitus  Currently on Metformin. -Check HbA1c today. -Encourage patient to regularly monitor blood glucose at home.  Shortness of Breath Patient reports shortness of breath. Pulmonologist has prescribed inhaler and is planning further testing. -Continue current treatment as prescribed by pulmonologist. - check BNP    No follow-ups on file.      Mila Merry, MD  [facility] [phone] (phone) [phone] (fax)  [facility]

## 2023-02-26 ENCOUNTER — Encounter: Payer: Self-pay | Admitting: Psychiatry

## 2023-02-26 ENCOUNTER — Ambulatory Visit (INDEPENDENT_AMBULATORY_CARE_PROVIDER_SITE_OTHER): Payer: BC Managed Care – PPO | Admitting: Psychiatry

## 2023-02-26 VITALS — BP 124/78 | HR 100 | Temp 97.9°F | Ht 63.0 in | Wt 159.0 lb

## 2023-02-26 DIAGNOSIS — F3341 Major depressive disorder, recurrent, in partial remission: Secondary | ICD-10-CM

## 2023-02-26 DIAGNOSIS — F419 Anxiety disorder, unspecified: Secondary | ICD-10-CM | POA: Diagnosis not present

## 2023-02-26 DIAGNOSIS — G4700 Insomnia, unspecified: Secondary | ICD-10-CM

## 2023-02-26 LAB — COMPREHENSIVE METABOLIC PANEL
ALT: 45 [IU]/L — ABNORMAL HIGH (ref 0–32)
AST: 53 [IU]/L — ABNORMAL HIGH (ref 0–40)
Albumin: 4.1 g/dL (ref 3.9–4.9)
Alkaline Phosphatase: 160 [IU]/L — ABNORMAL HIGH (ref 44–121)
BUN/Creatinine Ratio: 13 (ref 12–28)
BUN: 12 mg/dL (ref 8–27)
Bilirubin Total: 0.4 mg/dL (ref 0.0–1.2)
CO2: 23 mmol/L (ref 20–29)
Calcium: 9.9 mg/dL (ref 8.7–10.3)
Chloride: 98 mmol/L (ref 96–106)
Creatinine, Ser: 0.93 mg/dL (ref 0.57–1.00)
Globulin, Total: 3.3 g/dL (ref 1.5–4.5)
Glucose: 209 mg/dL — ABNORMAL HIGH (ref 70–99)
Potassium: 4.3 mmol/L (ref 3.5–5.2)
Sodium: 141 mmol/L (ref 134–144)
Total Protein: 7.4 g/dL (ref 6.0–8.5)
eGFR: 67 mL/min/{1.73_m2} (ref >59)

## 2023-02-26 LAB — HEMOGLOBIN A1C
Est. average glucose Bld gHb Est-mCnc: 183 mg/dL
Hgb A1c MFr Bld: 8 % — ABNORMAL HIGH (ref 4.8–5.6)

## 2023-02-26 LAB — MICROALBUMIN / CREATININE URINE RATIO
Creatinine, Urine: 120 mg/dL
Microalb/Creat Ratio: 10 mg/g{creat} (ref 0–29)
Microalbumin, Urine: 11.5 ug/mL

## 2023-02-26 LAB — LIPID PANEL
Chol/HDL Ratio: 3.3 {ratio} (ref 0.0–4.4)
Cholesterol, Total: 126 mg/dL (ref 100–199)
HDL: 38 mg/dL — ABNORMAL LOW (ref >39)
LDL Chol Calc (NIH): 51 mg/dL (ref 0–99)
Triglycerides: 232 mg/dL — ABNORMAL HIGH (ref 0–149)
VLDL Cholesterol Cal: 37 mg/dL (ref 5–40)

## 2023-02-26 LAB — LDL CHOLESTEROL, DIRECT: LDL Direct: 57 mg/dL (ref 0–99)

## 2023-02-26 LAB — BRAIN NATRIURETIC PEPTIDE: BNP: 37.3 pg/mL (ref 0.0–100.0)

## 2023-02-26 NOTE — Patient Instructions (Signed)
Continue venlafaxine 75 mg daily Continue quetiapine 100 mg at night  Decrease doxepin 10-20 mg at night as needed for insomnia Next appointment- 1/21 at 11:30

## 2023-02-27 ENCOUNTER — Other Ambulatory Visit: Payer: Self-pay | Admitting: Family Medicine

## 2023-02-27 DIAGNOSIS — E119 Type 2 diabetes mellitus without complications: Secondary | ICD-10-CM

## 2023-02-27 MED ORDER — METFORMIN HCL ER 500 MG PO TB24: 500.0000 mg | ORAL_TABLET | Freq: Two times a day (BID) | ORAL | 1 refills | Status: DC

## 2023-03-05 ENCOUNTER — Other Ambulatory Visit: Payer: Self-pay | Admitting: Physician Assistant

## 2023-03-07 ENCOUNTER — Ambulatory Visit: Payer: BC Managed Care – PPO | Attending: Student in an Organized Health Care Education/Training Program

## 2023-03-07 DIAGNOSIS — R0602 Shortness of breath: Secondary | ICD-10-CM | POA: Insufficient documentation

## 2023-03-07 DIAGNOSIS — J449 Chronic obstructive pulmonary disease, unspecified: Secondary | ICD-10-CM | POA: Insufficient documentation

## 2023-03-07 DIAGNOSIS — Z87891 Personal history of nicotine dependence: Secondary | ICD-10-CM | POA: Insufficient documentation

## 2023-03-07 DIAGNOSIS — R0609 Other forms of dyspnea: Secondary | ICD-10-CM | POA: Diagnosis not present

## 2023-03-07 LAB — PULMONARY FUNCTION TEST ARMC ONLY
DL/VA % pred: 56 %
DL/VA: 2.38 ml/min/mmHg/L
DLCO unc % pred: 48 %
DLCO unc: 9.18 ml/min/mmHg
FEF 25-75 Post: 1.73 L/s
FEF 25-75 Pre: 1.31 L/s
FEF2575-%Change-Post: 32 %
FEF2575-%Pred-Post: 89 %
FEF2575-%Pred-Pre: 67 %
FEV1-%Change-Post: 7 %
FEV1-%Pred-Post: 73 %
FEV1-%Pred-Pre: 68 %
FEV1-Post: 1.65 L
FEV1-Pre: 1.53 L
FEV1FVC-%Change-Post: -1 %
FEV1FVC-%Pred-Pre: 100 %
FEV6-%Change-Post: 8 %
FEV6-%Pred-Post: 76 %
FEV6-%Pred-Pre: 70 %
FEV6-Post: 2.16 L
FEV6-Pre: 1.99 L
FEV6FVC-%Change-Post: 0 %
FEV6FVC-%Pred-Post: 104 %
FEV6FVC-%Pred-Pre: 104 %
FVC-%Change-Post: 9 %
FVC-%Pred-Post: 73 %
FVC-%Pred-Pre: 67 %
FVC-Post: 2.18 L
FVC-Pre: 1.99 L
Post FEV1/FVC ratio: 76 %
Post FEV6/FVC ratio: 100 %
Pre FEV1/FVC ratio: 77 %
Pre FEV6/FVC Ratio: 100 %
RV % pred: 130 %
RV: 2.73 L
TLC % pred: 103 %
TLC: 5.07 L

## 2023-03-07 MED ORDER — ALBUTEROL SULFATE (2.5 MG/3ML) 0.083% IN NEBU
2.5000 mg | INHALATION_SOLUTION | Freq: Once | RESPIRATORY_TRACT | Status: AC
  Administered 2023-03-07: 2.5 mg via RESPIRATORY_TRACT
  Filled 2023-03-07: qty 3

## 2023-03-13 ENCOUNTER — Other Ambulatory Visit: Payer: Self-pay | Admitting: Internal Medicine

## 2023-03-14 ENCOUNTER — Ambulatory Visit: Payer: BC Managed Care – PPO | Admitting: Internal Medicine

## 2023-03-14 ENCOUNTER — Encounter: Payer: Self-pay | Admitting: Internal Medicine

## 2023-03-14 VITALS — BP 120/60 | HR 89 | Temp 97.8°F | Ht 63.0 in | Wt 160.0 lb

## 2023-03-14 DIAGNOSIS — R9389 Abnormal findings on diagnostic imaging of other specified body structures: Secondary | ICD-10-CM | POA: Diagnosis not present

## 2023-03-14 DIAGNOSIS — G4733 Obstructive sleep apnea (adult) (pediatric): Secondary | ICD-10-CM | POA: Diagnosis not present

## 2023-03-14 DIAGNOSIS — J449 Chronic obstructive pulmonary disease, unspecified: Secondary | ICD-10-CM

## 2023-03-14 DIAGNOSIS — J441 Chronic obstructive pulmonary disease with (acute) exacerbation: Secondary | ICD-10-CM

## 2023-03-14 NOTE — Patient Instructions (Signed)
CONGRATULATIONS!!! YOU STOPPED SMOKING!!!  EXCELLENT JOB A+ with your CPAP  Recommend albuterol inhaler for cough Motrin and Tylenol as needed Mucinex as needed Recommend home test for COVID  Avoid secondhand smoke Avoid SICK contacts Recommend  Masking  when appropriate Recommend Keep up-to-date with vaccinations

## 2023-03-14 NOTE — Progress Notes (Signed)
[facility]  Pulmonary Medicine Consultation      Date: 03/14/2023,   MRN# [id number] Katelyn Cooper [facility] 09/30/1955     Admission                  Current  Katelyn Cooper is a 67 y.o. old female seen in consultation for cough, pleuritic chest pain. Previous BQ patient   PFT FINDINGS PFT's 08/2015 reviewed with patient ratio 80% Fev1 2.2 L 87% FVC 2.6L 83%  CT chest 08/30/15 Left lung Nodule approx 6 MM increased from 2-3 MM from 2013.    CT chest 12/2015 Left Lung nodule approx 6 MM-no change from previous CT scans   CT chest 09/05/16 There is a right lower lobe small opacity probable mucus plugging mucoid impaction  CT chest 3.7.19 The left lower lobe nodule has not increased in size significantly from previous CAT scan  CT chest 8/19 Edema and effusions Left Nodule stable  findings reveiwed with patient  CT chest 05/2019 Stable 5 mm left lung nodule, consistent with benign perifissural lymph node. No active lung disease.  ECHO 8/19 left ventricular filling pattern, with   concomitant abnormal relaxation and increased filling pressure   (grade 2 diastolic dysfunction).  HST 2017 AHI of 12  CC Follow up assessment of COPD Follow up assessment of OSA Follow up CT chest   HPI Resp status US stable at this time Signs and symptoms of acute viral infection although mild COPD exacerbation at this time Patient does not have wheezing on exam however I do recommend taking her albuterol as needed Motrin and Tylenol as needed Recommend COVID testing at home  Pulmonary function test reviewed in detail with patient today Ratio less than 80% with FEV1 68% predicted DLCO WNL, No significant BD response Spirometry loops have some slight concavity to exp limb Minimal obstructive airways disease   No evidence of heart failure at this time No evidence or signs of infection at this time No respiratory distress No fevers, chills, nausea, vomiting, diarrhea No evidence of  lower extremity edema No evidence hemoptysis   Assessment of OSA Excellent compliance report reviewed in detail with patient 100% for days and greater than 4 hours AHI reduced to 3.5 CPAP of 9 Excellent control of OSA  CT chest lung cancer screening follow-up MARCH 2024 Stable 5 mm lung nodule Continue protocol as scheduled CT chest  Independently Reviewed By Me Today 03/14/2023        Current Medication:   Current Outpatient Medications:    acetaminophen (TYLENOL) 500 MG tablet, Take 500 mg by mouth every 6 (six) hours as needed., Disp: , Rfl:    albuterol (VENTOLIN HFA) 108 (90 Base) MCG/ACT inhaler, inhale 2 puffs by mouth every 6 hours if needed for wheezing or shortness of breath, Disp: 18 g, Rfl: 6   aspirin EC 81 MG tablet, Take 1 tablet (81 mg total) by mouth daily., Disp: , Rfl:    atorvastatin (LIPITOR) 80 MG tablet, Take 1 tablet by mouth daily., Disp: 90 tablet, Rfl: 0   b complex vitamins capsule, Take 1 capsule by mouth daily., Disp: , Rfl:    Cholecalciferol (VITAMIN D3) 5000 units TABS, Take 1 tablet by mouth daily. , Disp: , Rfl: 0   cyclobenzaprine (FLEXERIL) 10 MG tablet, Take 10 mg by mouth 3 (three) times daily., Disp: , Rfl:    doxepin (SINEQUAN) 10 MG capsule, Take 2 capsules (20 mg total) by mouth at bedtime as needed., Disp: 180 capsule, Rfl:  0   ezetimibe (ZETIA) 10 MG tablet, Take 1 tablet by mouth daily., Disp: 90 tablet, Rfl: 0   fluticasone (FLONASE) 50 MCG/ACT nasal spray, SPRAY 2 SPRAYS INTO EACH NOSTRIL EVERY DAY, Disp: 48 mL, Rfl: 0   furosemide (LASIX) 20 MG tablet, Take 1 tablet (20 mg total) by mouth every other day. Take 1 tablet by mouth daily as needed for increased fluid or weight gain., Disp: 90 tablet, Rfl: 3   glucose blood test strip, ONETOUCH ULTRA MINI STRIPS AND LANCETS. Use one daily to check blood sugar., Disp: 100 each, Rfl: 3   lansoprazole (PREVACID) 15 MG capsule, Take 15 mg by mouth daily at 6 (six) AM., Disp: , Rfl:     metFORMIN (GLUCOPHAGE-XR) 500 MG 24 hr tablet, Take 1 tablet (500 mg total) by mouth 2 (two) times daily., Disp: 180 tablet, Rfl: 1   metoprolol tartrate (LOPRESSOR) 25 MG tablet, Take 1 tablet by mouth twice daily., Disp: 180 tablet, Rfl: 3   montelukast (SINGULAIR) 10 MG tablet, TAKE 1 TABLET BY MOUTH EVERYDAY AT BEDTIME, Disp: 90 tablet, Rfl: 0   Multiple Vitamins-Minerals (MULTI-VITAMIN GUMMIES PO), Take by mouth. Taking 1 daily, Disp: , Rfl:    potassium chloride 20 MEQ/15ML (10%) SOLN, Take 7.5 mLs (10 mEq total) by mouth daily as needed (with furosemide)., Disp: 473 mL, Rfl: 6   QUEtiapine (SEROQUEL) 100 MG tablet, Take 1 tablet (100 mg total) by mouth at bedtime., Disp: 90 tablet, Rfl: 0   Varenicline Tartrate, Starter, (CHANTIX STARTING MONTH PAK) 0.5 MG X 11 & 1 MG X 42 TBPK, Take one 0.5 mg tab by mouth once daily for 3 days, then take one 0.5 mg tab twice daily for 4 days, then take one 1 mg tab twice daily., Disp: 53 each, Rfl: 0   venlafaxine XR (EFFEXOR-XR) 75 MG 24 hr capsule, Take 1 capsule (75 mg total) by mouth daily with breakfast., Disp: 90 capsule, Rfl: 1     ALLERGIES   Lovastatin, Paroxetine hcl, and Lamotrigine  BP 120/60 (BP Location: Left Arm, Cuff Size: Normal)   Pulse 89   Temp 97.8 F (36.6 C) (Temporal)   Ht 5\' 3"  (1.6 m)   Wt 160 lb (72.6 kg)   SpO2 95%   BMI 28.34 kg/m       Review of Systems: Gen:  Denies  fever, sweats, chills weight loss  HEENT: Denies blurred vision, double vision, ear pain, eye pain, hearing loss, nose bleeds, sore throat Cardiac:  No dizziness, chest pain or heaviness, chest tightness,edema, No JVD Resp:   + cough, -sputum production, -shortness of breath,+wheezing, -hemoptysis,  Other:  All other systems negative   Physical Examination:   General Appearance: No distress  EYES PERRLA, EOM intact.   NECK Supple, No JVD Pulmonary: normal breath sounds, No wheezing.  CardiovascularNormal S1,S2.  No m/r/g.   Abdomen:  Benign, Soft, non-tender. Neurology UE/LE 5/5 strength, no focal deficits Ext pulses intact, cap refill intact ALL OTHER ROS ARE NEGATIVE      ASSESSMENT/PLAN   67 year old female seen today for follow-up assessment for COPD with underlying grade 2 diastolic with underlying sleep apnea enrolled in the lung cancer screening program due to extensive smoking history   Regarding COPD mild FEV1 68% predicted  Currently COPD exacerbation mild No indication for antibiotics or prednisone at this time I do recommend using her albuterol inhaler as needed Recommend Motrin and Tylenol as needed Mucinex as needed Patient is not on any maintenance inhaler  therapy at this time Avoid secondhand smoke  Avoid SICK contacts Recommend  Masking  when appropriate Recommend Keep up-to-date with vaccinations   Assessment of OSA Excellent compliance report Patient uses benefits from therapy AHI significantly reduced down to 3.5 OSA well-controlled   Diastolic heart failure grade 2 Follow-up cardiology   Lung cancer screening program Abnormal CT scan Lung cancer screening protocol CT chest March 2024 reviewed in detail with patient independently Stable 5 mm left lung nodule Follow-up annually  Patient at risk for cancer    MEDICATION ADJUSTMENTS/LABS AND TESTS ORDERED: Recommend albuterol inhaler for cough Motrin and Tylenol as needed Mucinex as needed Recommend home test for COVID  Avoid secondhand smoke Avoid SICK contacts Recommend  Masking  when appropriate Recommend Keep up-to-date with vaccinations  CURRENT MEDICATIONS REVIEWED AT LENGTH WITH PATIENT TODAY   Follow Up in 6 months  Total time spent 42 minutes  Katelyn Cooper Santiago Glad, M.D.  [facility]  Medical Director [facility] Medical Director [facility] Cardio-Pulmonary Department

## 2023-03-15 ENCOUNTER — Encounter: Payer: Self-pay | Admitting: Family Medicine

## 2023-03-15 ENCOUNTER — Telehealth: Payer: Self-pay | Admitting: Internal Medicine

## 2023-03-15 NOTE — Telephone Encounter (Signed)
Patient's husband advised as below. DPR checked. Nothing further needed.

## 2023-03-15 NOTE — Telephone Encounter (Signed)
Pt saw Dr. Belia Heman yesterday. He asked her to take a Covid test. She and her husband are both pos. Can we get something for her?  [facility] on [address]

## 2023-03-15 NOTE — Telephone Encounter (Signed)
 Care team updated and letter sent for eye exam notes.

## 2023-03-26 ENCOUNTER — Other Ambulatory Visit: Payer: Self-pay | Admitting: Psychiatry

## 2023-04-15 ENCOUNTER — Ambulatory Visit (INDEPENDENT_AMBULATORY_CARE_PROVIDER_SITE_OTHER): Payer: BC Managed Care – PPO | Admitting: Clinical

## 2023-04-15 DIAGNOSIS — F411 Generalized anxiety disorder: Secondary | ICD-10-CM | POA: Diagnosis not present

## 2023-04-15 DIAGNOSIS — F4310 Post-traumatic stress disorder, unspecified: Secondary | ICD-10-CM | POA: Diagnosis not present

## 2023-04-15 DIAGNOSIS — F3341 Major depressive disorder, recurrent, in partial remission: Secondary | ICD-10-CM

## 2023-04-15 NOTE — Progress Notes (Signed)
   Doree Barthel, LCSW
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
## 2023-04-18 ENCOUNTER — Other Ambulatory Visit: Payer: Self-pay | Admitting: Physician Assistant
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
## 2023-04-23 ENCOUNTER — Ambulatory Visit: Payer: BC Managed Care – PPO | Admitting: Clinical

## 2023-04-23 ENCOUNTER — Encounter: Payer: Self-pay | Admitting: Psychiatry

## 2023-04-23 ENCOUNTER — Ambulatory Visit (INDEPENDENT_AMBULATORY_CARE_PROVIDER_SITE_OTHER): Payer: BC Managed Care – PPO | Admitting: Psychiatry

## 2023-04-23 VITALS — BP 129/72 | HR 86 | Temp 96.4°F | Ht 63.0 in | Wt 160.8 lb

## 2023-04-23 DIAGNOSIS — F411 Generalized anxiety disorder: Secondary | ICD-10-CM

## 2023-04-23 DIAGNOSIS — G4700 Insomnia, unspecified: Secondary | ICD-10-CM

## 2023-04-23 DIAGNOSIS — F3341 Major depressive disorder, recurrent, in partial remission: Secondary | ICD-10-CM | POA: Diagnosis not present

## 2023-04-23 DIAGNOSIS — F4310 Post-traumatic stress disorder, unspecified: Secondary | ICD-10-CM | POA: Diagnosis not present

## 2023-04-23 MED ORDER — DOXEPIN HCL 10 MG PO CAPS: 10.0000 mg | ORAL_CAPSULE | Freq: Every evening | ORAL | 0 refills | Status: DC | PRN

## 2023-04-23 MED ORDER — CARIPRAZINE HCL 1.5 MG PO CAPS: 1.5000 mg | ORAL_CAPSULE | Freq: Every day | ORAL | 1 refills | Status: DC
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
## 2023-04-23 NOTE — Patient Instructions (Signed)
Continue venlafaxine 75 mg daily Decrease quetiapine 50 mg at night for one week, then discontinue  Start vraylar 1.5 mg daily  Decrease doxepin 10 mg at night as needed for insomnia Next appointment- 3/20 at 4:30

## 2023-04-23 NOTE — Progress Notes (Signed)
                Dezi Schaner, LCSW 

## 2023-04-24 ENCOUNTER — Other Ambulatory Visit: Payer: Self-pay | Admitting: Psychiatry

## 2023-04-24 DIAGNOSIS — F3341 Major depressive disorder, recurrent, in partial remission: Secondary | ICD-10-CM

## 2023-05-02 ENCOUNTER — Telehealth: Payer: Self-pay

## 2023-05-02 NOTE — Telephone Encounter (Signed)
pt left a message that she can not take the new medication vraylar she wants to go back on the seroquel.   Pt was last seen on 1-21 next appt 3-20

## 2023-05-02 NOTE — Telephone Encounter (Signed)
Noted. Advise her to be back on quetiapine 100 mg at night for now. Let me know if she needs a refill.

## 2023-05-03 NOTE — Telephone Encounter (Signed)
tried to call patient no answer and no message could be left.

## 2023-05-06 ENCOUNTER — Encounter: Payer: Self-pay | Admitting: Gastroenterology

## 2023-05-06 NOTE — Telephone Encounter (Signed)
tried to call again before closing phone message. when i called it was no answer and it states to please try your call again later. no message could be left.

## 2023-05-08 ENCOUNTER — Other Ambulatory Visit: Payer: Self-pay | Admitting: Physician Assistant

## 2023-05-15 ENCOUNTER — Other Ambulatory Visit: Payer: Self-pay | Admitting: Psychiatry

## 2023-05-20 ENCOUNTER — Encounter: Payer: Self-pay | Admitting: Family Medicine

## 2023-05-20 ENCOUNTER — Telehealth: Payer: Self-pay

## 2023-05-20 ENCOUNTER — Ambulatory Visit: Payer: BC Managed Care – PPO | Admitting: Family Medicine

## 2023-05-20 VITALS — BP 93/61 | HR 92 | Resp 16 | Ht 63.0 in | Wt 163.3 lb

## 2023-05-20 DIAGNOSIS — E1151 Type 2 diabetes mellitus with diabetic peripheral angiopathy without gangrene: Secondary | ICD-10-CM

## 2023-05-20 DIAGNOSIS — Z7984 Long term (current) use of oral hypoglycemic drugs: Secondary | ICD-10-CM

## 2023-05-20 DIAGNOSIS — I2510 Atherosclerotic heart disease of native coronary artery without angina pectoris: Secondary | ICD-10-CM

## 2023-05-20 DIAGNOSIS — I739 Peripheral vascular disease, unspecified: Secondary | ICD-10-CM

## 2023-05-20 MED ORDER — TIRZEPATIDE 2.5 MG/0.5ML ~~LOC~~ SOAJ: 2.5000 mg | SUBCUTANEOUS | 0 refills | Status: DC

## 2023-05-20 NOTE — Telephone Encounter (Signed)
Copied from CRM [id number]. Topic: General - Other >> May 20, 2023  3:57 PM Clide Dales wrote: Patient called to advise that her [name] requires prior authorization. Please advise.

## 2023-05-20 NOTE — Progress Notes (Signed)
Established patient visit   Patient: Katelyn Cooper   DOB: 02/19/1956   68 y.o. Female  MRN: [id number] Visit Date: 05/20/2023  Today's healthcare provider: Mila Merry, MD   Chief Complaint  Patient presents with   Medical Management of Chronic Issues    Weight management   Subjective    Discussed the use of AI scribe software for clinical note transcription with the patient, who gave verbal consent to proceed.  History of Present Illness   Katelyn Cooper "[name]" is a 68 year old female with diabetes for follow up. She is taking metformin twice a day every day. She is trying to lose weight consuming small meals with small snacks between meals which are usually whole fruit. and working on avoiding sweets and starchy foods, but weight continues to go up. She walks with a friend three days a week, covering a mile and a half each day, totaling about five and a half to six miles a week.     Lab Results  Component Value Date   HGBA1C 8.0 (H) 02/25/2023   HGBA1C 6.9 (A) 10/24/2022   HGBA1C 6.5 (A) 01/30/2022     Medications: Outpatient Medications Prior to Visit  Medication Sig   acetaminophen (TYLENOL) 500 MG tablet Take 500 mg by mouth every 6 (six) hours as needed.   albuterol (VENTOLIN HFA) 108 (90 Base) MCG/ACT inhaler inhale 2 puffs by mouth every 6 hours if needed for wheezing or shortness of breath   aspirin EC 81 MG tablet Take 1 tablet (81 mg total) by mouth daily.   atorvastatin (LIPITOR) 80 MG tablet Take 1 tablet by mouth daily.   b complex vitamins capsule Take 1 capsule by mouth daily.   Cholecalciferol (VITAMIN D3) 5000 units TABS Take 1 tablet by mouth daily.    doxepin (SINEQUAN) 10 MG capsule Take 1 capsule (10 mg total) by mouth at bedtime as needed (insomnia).   ezetimibe (ZETIA) 10 MG tablet Take 1 tablet by mouth daily.   fluticasone (FLONASE) 50 MCG/ACT nasal spray SPRAY 2 SPRAYS INTO EACH NOSTRIL EVERY DAY   furosemide (LASIX) 20 MG tablet Take 1  tablet by mouth every other day and may take 1 tablet by mouth  as needed for increased fluid or weight gain on days that you would normally not take a tablet.   glucose blood test strip ONETOUCH ULTRA MINI STRIPS AND LANCETS. Use one daily to check blood sugar.   lansoprazole (PREVACID) 15 MG capsule Take 15 mg by mouth daily at 6 (six) AM.   metFORMIN (GLUCOPHAGE-XR) 500 MG 24 hr tablet Take 1 tablet (500 mg total) by mouth 2 (two) times daily.   metoprolol tartrate (LOPRESSOR) 25 MG tablet Take 1 tablet by mouth twice daily.   montelukast (SINGULAIR) 10 MG tablet TAKE 1 TABLET BY MOUTH EVERYDAY AT BEDTIME   Multiple Vitamins-Minerals (MULTI-VITAMIN GUMMIES PO) Take by mouth. Taking 1 daily   potassium chloride 20 MEQ/15ML (10%) SOLN Take 7.5 mLs (10 mEq total) by mouth daily as needed (with furosemide).   QUEtiapine (SEROQUEL) 100 MG tablet Take 1 tablet (100 mg total) by mouth at bedtime.   venlafaxine XR (EFFEXOR-XR) 75 MG 24 hr capsule Take 1 capsule (75 mg total) by mouth daily with breakfast.   cyclobenzaprine (FLEXERIL) 10 MG tablet Take 10 mg by mouth 3 (three) times daily.   doxepin (SINEQUAN) 10 MG capsule Take 2 capsules (20 mg total) by mouth at bedtime as needed.  No facility-administered medications prior to visit.   Review of Systems  Constitutional:  Negative for appetite change, chills, fatigue and fever.  Respiratory:  Negative for chest tightness and shortness of breath.   Cardiovascular:  Negative for chest pain and palpitations.  Gastrointestinal:  Negative for abdominal pain, nausea and vomiting.  Neurological:  Negative for dizziness and weakness.   Patient Active Problem List   Diagnosis Date Noted   Centrilobular emphysema (HCC) 06/07/2022   Insomnia 12/18/2021   MDD (major depressive disorder), recurrent, in partial remission (HCC) 12/18/2021   Leg weakness, bilateral 10/18/2021   SIRS (systemic inflammatory response syndrome) (HCC) 10/18/2021   Polycythemia  10/18/2021   Hypokalemia 10/18/2021   Lactic acidosis 10/18/2021   Elevated AST (SGOT) 10/18/2021   PSVT (paroxysmal supraventricular tachycardia) (HCC) 08/10/2020   Pain 05/31/2020   Vasovagal syncope 05/31/2020   Type 2 diabetes mellitus with diabetic peripheral angiopathy without gangrene, without long-term current use of insulin (HCC) 03/16/2020   Diarrhea 01/27/2020   Angular cheilitis 10/16/2019   Bipolar disorder in remission (HCC) 02/09/2019   Post traumatic stress disorder (PTSD) 02/09/2019   Chronic heart failure with preserved ejection fraction (HFpEF) (HCC) 11/15/2017   Essential hypertension 11/15/2017   Obstructive sleep apnea 11/15/2017   NICM (nonischemic cardiomyopathy) (HCC) 11/13/2017   CAD in native artery 11/13/2017   Menopausal symptom 06/13/2017   Osteopenia 06/13/2017   Uterine leiomyoma 06/13/2017   PAD (peripheral artery disease) (HCC) 09/19/2016   Hyperlipidemia associated with type 2 diabetes mellitus (HCC) 09/19/2016   Coronary artery disease involving native coronary artery of native heart without angina pectoris 09/05/2016   Abnormal stress test 01/12/2016   Pleuritic chest pain 07/28/2015   Duodenitis 01/05/2015   Dermatitis, nummular 01/05/2015   Allergic rhinitis 12/23/2014   Ataxia 12/23/2014   Back ache 12/23/2014   Cervical muscle strain 12/23/2014   Depression 12/23/2014   Eczema 12/23/2014   GERD (gastroesophageal reflux disease) 12/23/2014   History of anemia 12/23/2014   Pure hypercholesterolemia 12/23/2014   Nephropyelitis 12/23/2014   Tremor 12/23/2014   Vitamin D deficiency 11/03/2013   Diabetes mellitus type 2, controlled (HCC) 08/13/2011   Cough 05/10/2011   Chest pain 05/10/2011   Tobacco use 05/10/2011        Objective    BP 93/61 (BP Location: Left Arm, Patient Position: Sitting, Cuff Size: Normal)   Pulse 92   Resp 16   Ht 5\' 3"  (1.6 m)   Wt 163 lb 4.8 oz (74.1 kg)   SpO2 97%   BMI 28.93 kg/m   Physical Exam    General appearance: Well developed, well nourished female, cooperative and in no acute distress Head: Normocephalic, without obvious abnormality, atraumatic Respiratory: Respirations even and unlabored, normal respiratory rate Extremities: All extremities are intact.  Skin: Skin color, texture, turgor normal. No rashes seen  Psych: Appropriate mood and affect. Neurologic: Mental status: Alert, oriented to person, place, and time, thought content appropriate.     Assessment & Plan        Type 2 Diabetes Mellitus with coronary artery and peripheral artery disease Suboptimal control despite Metformin BID. Patient is interested in weight loss to improve glycemic control. Patient has been struggling with weight gain despite dietary modifications and regular exercise. Discussed the benefits of GLP-1 agonists for weight loss. Discussed the benefits of GLP-1 agonists for both glycemic control and weight loss. -Start Mounjaro 2.5mg  weekly, titrate to 5mg  after 4 weeks. -Continue Metformin BID until follow-up. -Check blood glucose levels regularly  and report any side effects.  -Start Mounjaro 2.5mg  weekly, titrate to 5mg  after 4 weeks. -Encourage continuation of healthy diet and regular exercise.  Follow-up in 3 months to assess response to [facility] and adjust treatment plan as necessary.     Future Appointments  Date Time Provider Department Center  06/20/2023  4:30 PM Neysa Hotter, MD [facility] None  07/08/2023  8:50 AM Jenel Lucks, MD [facility]  08/16/2023  8:00 AM Malva Limes, MD [facility]  09/25/2023  8:30 AM Erin Fulling, MD [facility] None  01/22/2024  9:15 AM Deirdre Evener, MD [facility] None        Mila Merry, MD  [facility] [phone] (phone) [phone] (fax)  [facility]

## 2023-05-20 NOTE — Patient Instructions (Signed)
 Katelyn Cooper  Please review the attached list of medications and notify my office if there are any errors.   . Please bring all of your medications to every appointment so we can make sure that our medication list is the same as yours.

## 2023-05-23 ENCOUNTER — Other Ambulatory Visit: Payer: Self-pay | Admitting: Family Medicine

## 2023-05-23 NOTE — Telephone Encounter (Signed)
Requested Prescriptions  Pending Prescriptions Disp Refills   fluticasone (FLONASE) 50 MCG/ACT nasal spray [Pharmacy Med Name: FLUTICASONE PROP 50 MCG SPRAY] 48 mL 1    Sig: SPRAY 2 SPRAYS INTO EACH NOSTRIL EVERY DAY     Ear, Nose, and Throat: Nasal Preparations - Corticosteroids Passed - 05/23/2023 12:50 PM      Passed - Valid encounter within last 12 months    Recent Outpatient Visits           2 months ago Type 2 diabetes mellitus with diabetic peripheral angiopathy without gangrene, without long-term current use of insulin (HCC)   [facility] Malva Limes, MD   7 months ago Type 2 diabetes mellitus with diabetic peripheral angiopathy without gangrene, without long-term current use of insulin (HCC)   [facility] Malva Limes, MD   1 year ago Controlled type 2 diabetes mellitus without complication, without long-term current use of insulin (HCC)   [facility] Malva Limes, MD   1 year ago PAD (peripheral artery disease) [facility])   [facility] [place], [place], PA-C   1 year ago Trapezius strain, left, sequela   [facility] Malva Limes, MD       Future Appointments             In 2 months Fisher, Demetrios Isaacs, MD [facility]   In 4 months Erin Fulling, MD [facility]   In 8 months Deirdre Evener, MD [facility]

## 2023-05-28 ENCOUNTER — Telehealth: Payer: Self-pay

## 2023-05-28 NOTE — Telephone Encounter (Signed)
 Pt called in again about status of PA for [facility]. I let her know this is still being worked on.

## 2023-05-28 NOTE — Telephone Encounter (Signed)
 Copied from CRM [id number]. Topic: Clinical - Prescription Issue >> May 28, 2023  8:26 AM Katelyn Cooper wrote: Reason for CRM: Patient is calling in regarding the status of her prior authorization for [facility]. Patient spoke with [facility] on 2/21 and they have not received the prior authorization. I called [employer] several time but no one answered. Patient is requesting a callback at [phone]

## 2023-05-29 ENCOUNTER — Other Ambulatory Visit (HOSPITAL_COMMUNITY): Payer: Self-pay

## 2023-05-29 NOTE — Telephone Encounter (Signed)
 Copied from CRM [id number]. Topic: Clinical - Prescription Issue >> May 29, 2023  8:27 AM Katelyn Cooper wrote: Reason for CRM: Pt called in because she has been trying to get a Prior Authorization for medication tirzepatide [facility]) 2.5 MG/0.5ML Pen [[id number]]  Pt would like a status update once approved or completed. [id number]

## 2023-05-30 ENCOUNTER — Telehealth: Payer: Self-pay | Admitting: Pharmacy Technician

## 2023-05-30 ENCOUNTER — Other Ambulatory Visit (HOSPITAL_COMMUNITY): Payer: Self-pay

## 2023-05-30 NOTE — Telephone Encounter (Signed)
 Pharmacy Patient Advocate Encounter  Ran pharmacy test claim for [facility] 2.5mg /0.57ml pen. Medication was filled and processed thru her Insurance on 05/21/2023. Called patient's pharmacy and was advised that the medication was available for pickup, however her copay is $1049.98. Patient would need to contact [employer] to inquire about her copayment/deductible.

## 2023-05-30 NOTE — Telephone Encounter (Signed)
 PA request has been Approved. New Encounter created for follow up. For additional info see Pharmacy Prior Auth telephone encounter from 05/30/2023.

## 2023-05-30 NOTE — Telephone Encounter (Signed)
 Faxed form to [facility] Utilization Mgmt that Dr. Sherrie Mustache completed along with OV note from 05/20/23 to [phone]

## 2023-06-02 ENCOUNTER — Other Ambulatory Visit: Payer: Self-pay | Admitting: Physician Assistant

## 2023-06-03 NOTE — Telephone Encounter (Signed)
 Please contact patient for over due appointment for future refills. Thanks

## 2023-06-06 ENCOUNTER — Other Ambulatory Visit: Payer: Self-pay | Admitting: Acute Care

## 2023-06-06 DIAGNOSIS — Z122 Encounter for screening for malignant neoplasm of respiratory organs: Secondary | ICD-10-CM

## 2023-06-06 DIAGNOSIS — Z87891 Personal history of nicotine dependence: Secondary | ICD-10-CM

## 2023-06-06 DIAGNOSIS — F17210 Nicotine dependence, cigarettes, uncomplicated: Secondary | ICD-10-CM

## 2023-06-11 ENCOUNTER — Other Ambulatory Visit: Payer: Self-pay | Admitting: Internal Medicine

## 2023-06-14 ENCOUNTER — Encounter: Payer: Self-pay | Admitting: Family Medicine

## 2023-06-14 DIAGNOSIS — E1151 Type 2 diabetes mellitus with diabetic peripheral angiopathy without gangrene: Secondary | ICD-10-CM

## 2023-06-14 DIAGNOSIS — I2510 Atherosclerotic heart disease of native coronary artery without angina pectoris: Secondary | ICD-10-CM
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
## 2023-06-17 ENCOUNTER — Ambulatory Visit
Admission: RE | Admit: 2023-06-17 | Discharge: 2023-06-17 | Disposition: A | Discharge status: Home / Self Care | Source: Ambulatory Visit | Attending: Acute Care | Admitting: Acute Care

## 2023-06-17 DIAGNOSIS — Z87891 Personal history of nicotine dependence: Secondary | ICD-10-CM | POA: Insufficient documentation

## 2023-06-17 DIAGNOSIS — F17210 Nicotine dependence, cigarettes, uncomplicated: Secondary | ICD-10-CM | POA: Diagnosis not present

## 2023-06-17 DIAGNOSIS — Z122 Encounter for screening for malignant neoplasm of respiratory organs: Secondary | ICD-10-CM | POA: Insufficient documentation

## 2023-06-20 ENCOUNTER — Encounter: Payer: Self-pay | Admitting: Psychiatry

## 2023-06-20 ENCOUNTER — Ambulatory Visit (INDEPENDENT_AMBULATORY_CARE_PROVIDER_SITE_OTHER): Payer: BC Managed Care – PPO | Admitting: Psychiatry

## 2023-06-20 VITALS — BP 124/64 | HR 99 | Temp 97.9°F | Ht 63.0 in | Wt 160.4 lb

## 2023-06-20 DIAGNOSIS — F411 Generalized anxiety disorder: Secondary | ICD-10-CM

## 2023-06-20 DIAGNOSIS — G4700 Insomnia, unspecified: Secondary | ICD-10-CM | POA: Diagnosis not present

## 2023-06-20 DIAGNOSIS — F3341 Major depressive disorder, recurrent, in partial remission: Secondary | ICD-10-CM

## 2023-06-20 NOTE — Patient Instructions (Signed)
 Continue venlafaxine 75 mg daily Continue quetiapine 100 mg at night for one week Decrease doxepin 10 mg at night as needed for insomnia- Next appointment- 5/6 at 3pm

## 2023-06-21 ENCOUNTER — Other Ambulatory Visit: Payer: Self-pay | Admitting: Family Medicine

## 2023-06-24 MED ORDER — TIRZEPATIDE 5 MG/0.5ML ~~LOC~~ SOAJ
5.0000 mg | SUBCUTANEOUS | 1 refills | Status: DC
Start: 2023-06-24 — End: 2023-08-06

## 2023-06-24 NOTE — Telephone Encounter (Signed)
 Requested medication (s) are due for refill today: yes  Requested medication (s) are on the active medication list: yes  Last refill:  05/20/23  Future visit scheduled: yes  Notes to clinic:   Medication not assigned to a protocol, review manually.      Requested Prescriptions  Pending Prescriptions Disp Refills   MOUNJARO 2.5 MG/0.5ML Pen [Pharmacy Med Name: MOUNJARO 2.5 MG/0.5 ML PEN]      Sig: INJECT 2.5 MG SUBCUTANEOUSLY WEEKLY     Off-Protocol Failed - 06/24/2023 10:28 AM      Failed - Medication not assigned to a protocol, review manually.      Passed - Valid encounter within last 12 months    Recent Outpatient Visits           3 months ago Type 2 diabetes mellitus with diabetic peripheral angiopathy without gangrene, without long-term current use of insulin (HCC)   [place] [facility] Malva Limes, MD   8 months ago Type 2 diabetes mellitus with diabetic peripheral angiopathy without gangrene, without long-term current use of insulin (HCC)   [place] [facility] Malva Limes, MD   1 year ago Controlled type 2 diabetes mellitus without complication, without long-term current use of insulin (HCC)   [place] [facility] Malva Limes, MD   1 year ago PAD (peripheral artery disease) [facility])   [place] [facility] [place] [place], PA-C   1 year ago Trapezius strain, left, sequela   [facility] Malva Limes, MD       Future Appointments             In 1 month Fisher, Demetrios Isaacs, MD [facility]   In 3 months Erin Fulling, MD [facility] [place] [place]   In 7 months Deirdre Evener, MD [facility]

## 2023-06-30 ENCOUNTER — Other Ambulatory Visit: Payer: Self-pay | Admitting: Psychiatry

## 2023-07-08 ENCOUNTER — Ambulatory Visit: Payer: BC Managed Care – PPO | Admitting: Gastroenterology

## 2023-07-19 DIAGNOSIS — M7062 Trochanteric bursitis, left hip: Secondary | ICD-10-CM | POA: Diagnosis not present

## 2023-07-24 ENCOUNTER — Other Ambulatory Visit: Payer: Self-pay

## 2023-07-24 ENCOUNTER — Other Ambulatory Visit: Payer: Self-pay | Admitting: Psychiatry

## 2023-07-24 DIAGNOSIS — Z122 Encounter for screening for malignant neoplasm of respiratory organs: Secondary | ICD-10-CM

## 2023-07-24 DIAGNOSIS — Z87891 Personal history of nicotine dependence: Secondary | ICD-10-CM

## 2023-07-24 DIAGNOSIS — F3341 Major depressive disorder, recurrent, in partial remission: Secondary | ICD-10-CM

## 2023-08-06 ENCOUNTER — Inpatient Hospital Stay
Admission: EM | Admit: 2023-08-06 | Discharge: 2023-08-09 | DRG: 641 | Disposition: A | Discharge status: Home Health | Attending: Internal Medicine | Admitting: Internal Medicine

## 2023-08-06 ENCOUNTER — Other Ambulatory Visit: Payer: Self-pay

## 2023-08-06 ENCOUNTER — Emergency Department

## 2023-08-06 ENCOUNTER — Ambulatory Visit: Admitting: Psychiatry

## 2023-08-06 ENCOUNTER — Ambulatory Visit: Payer: Self-pay

## 2023-08-06 ENCOUNTER — Ambulatory Visit: Admission: EM | Admit: 2023-08-06 | Discharge: 2023-08-06 | Disposition: A | Discharge status: Home / Self Care

## 2023-08-06 DIAGNOSIS — I10 Essential (primary) hypertension: Secondary | ICD-10-CM | POA: Diagnosis present

## 2023-08-06 DIAGNOSIS — R1319 Other dysphagia: Secondary | ICD-10-CM

## 2023-08-06 DIAGNOSIS — T452X5A Adverse effect of vitamins, initial encounter: Secondary | ICD-10-CM | POA: Diagnosis present

## 2023-08-06 DIAGNOSIS — E860 Dehydration: Secondary | ICD-10-CM | POA: Diagnosis present

## 2023-08-06 DIAGNOSIS — Z7982 Long term (current) use of aspirin: Secondary | ICD-10-CM

## 2023-08-06 DIAGNOSIS — Z825 Family history of asthma and other chronic lower respiratory diseases: Secondary | ICD-10-CM

## 2023-08-06 DIAGNOSIS — Z7984 Long term (current) use of oral hypoglycemic drugs: Secondary | ICD-10-CM

## 2023-08-06 DIAGNOSIS — I110 Hypertensive heart disease with heart failure: Secondary | ICD-10-CM | POA: Diagnosis present

## 2023-08-06 DIAGNOSIS — J449 Chronic obstructive pulmonary disease, unspecified: Secondary | ICD-10-CM | POA: Diagnosis not present

## 2023-08-06 DIAGNOSIS — E1151 Type 2 diabetes mellitus with diabetic peripheral angiopathy without gangrene: Secondary | ICD-10-CM | POA: Diagnosis present

## 2023-08-06 DIAGNOSIS — Z85828 Personal history of other malignant neoplasm of skin: Secondary | ICD-10-CM

## 2023-08-06 DIAGNOSIS — E785 Hyperlipidemia, unspecified: Secondary | ICD-10-CM | POA: Diagnosis not present

## 2023-08-06 DIAGNOSIS — I5032 Chronic diastolic (congestive) heart failure: Secondary | ICD-10-CM | POA: Diagnosis present

## 2023-08-06 DIAGNOSIS — F419 Anxiety disorder, unspecified: Secondary | ICD-10-CM | POA: Diagnosis present

## 2023-08-06 DIAGNOSIS — R55 Syncope and collapse: Secondary | ICD-10-CM | POA: Diagnosis not present

## 2023-08-06 DIAGNOSIS — Z8249 Family history of ischemic heart disease and other diseases of the circulatory system: Secondary | ICD-10-CM

## 2023-08-06 DIAGNOSIS — K3189 Other diseases of stomach and duodenum: Secondary | ICD-10-CM | POA: Diagnosis not present

## 2023-08-06 DIAGNOSIS — Z8711 Personal history of peptic ulcer disease: Secondary | ICD-10-CM | POA: Diagnosis not present

## 2023-08-06 DIAGNOSIS — W19XXXA Unspecified fall, initial encounter: Secondary | ICD-10-CM | POA: Diagnosis present

## 2023-08-06 DIAGNOSIS — I700 Atherosclerosis of aorta: Secondary | ICD-10-CM | POA: Diagnosis present

## 2023-08-06 DIAGNOSIS — I951 Orthostatic hypotension: Secondary | ICD-10-CM | POA: Diagnosis present

## 2023-08-06 DIAGNOSIS — G4733 Obstructive sleep apnea (adult) (pediatric): Secondary | ICD-10-CM | POA: Diagnosis present

## 2023-08-06 DIAGNOSIS — F32A Depression, unspecified: Secondary | ICD-10-CM | POA: Diagnosis not present

## 2023-08-06 DIAGNOSIS — Z87891 Personal history of nicotine dependence: Secondary | ICD-10-CM

## 2023-08-06 DIAGNOSIS — E119 Type 2 diabetes mellitus without complications: Secondary | ICD-10-CM | POA: Diagnosis not present

## 2023-08-06 DIAGNOSIS — K5730 Diverticulosis of large intestine without perforation or abscess without bleeding: Secondary | ICD-10-CM | POA: Diagnosis not present

## 2023-08-06 DIAGNOSIS — R531 Weakness: Principal | ICD-10-CM

## 2023-08-06 DIAGNOSIS — Z7985 Long-term (current) use of injectable non-insulin antidiabetic drugs: Secondary | ICD-10-CM | POA: Diagnosis not present

## 2023-08-06 DIAGNOSIS — I454 Nonspecific intraventricular block: Secondary | ICD-10-CM | POA: Diagnosis present

## 2023-08-06 DIAGNOSIS — K2100 Gastro-esophageal reflux disease with esophagitis, without bleeding: Secondary | ICD-10-CM | POA: Diagnosis present

## 2023-08-06 DIAGNOSIS — I2510 Atherosclerotic heart disease of native coronary artery without angina pectoris: Secondary | ICD-10-CM | POA: Diagnosis not present

## 2023-08-06 DIAGNOSIS — E861 Hypovolemia: Secondary | ICD-10-CM | POA: Diagnosis present

## 2023-08-06 DIAGNOSIS — R42 Dizziness and giddiness: Secondary | ICD-10-CM | POA: Diagnosis not present

## 2023-08-06 DIAGNOSIS — Z79899 Other long term (current) drug therapy: Secondary | ICD-10-CM

## 2023-08-06 DIAGNOSIS — E1143 Type 2 diabetes mellitus with diabetic autonomic (poly)neuropathy: Secondary | ICD-10-CM | POA: Diagnosis not present

## 2023-08-06 DIAGNOSIS — N179 Acute kidney failure, unspecified: Secondary | ICD-10-CM | POA: Diagnosis not present

## 2023-08-06 DIAGNOSIS — R109 Unspecified abdominal pain: Secondary | ICD-10-CM | POA: Diagnosis not present

## 2023-08-06 LAB — COMPREHENSIVE METABOLIC PANEL WITH GFR
ALT: 45 U/L — ABNORMAL HIGH (ref 0–44)
AST: 42 U/L — ABNORMAL HIGH (ref 15–41)
Albumin: 4.2 g/dL (ref 3.5–5.0)
Alkaline Phosphatase: 85 U/L (ref 38–126)
Anion gap: 17 — ABNORMAL HIGH (ref 5–15)
BUN: 20 mg/dL (ref 8–23)
CO2: 24 mmol/L (ref 22–32)
Calcium: 14.2 mg/dL (ref 8.9–10.3)
Chloride: 94 mmol/L — ABNORMAL LOW (ref 98–111)
Creatinine, Ser: 1.91 mg/dL — ABNORMAL HIGH (ref 0.44–1.00)
GFR, Estimated: 28 mL/min — ABNORMAL LOW (ref >60)
Glucose, Bld: 185 mg/dL — ABNORMAL HIGH (ref 70–99)
Potassium: 3.7 mmol/L (ref 3.5–5.1)
Sodium: 135 mmol/L (ref 135–145)
Total Bilirubin: 0.9 mg/dL (ref 0.0–1.2)
Total Protein: 8.4 g/dL — ABNORMAL HIGH (ref 6.5–8.1)

## 2023-08-06 LAB — CBG MONITORING, ED: Glucose-Capillary: 136 mg/dL — ABNORMAL HIGH (ref 70–99)

## 2023-08-06 LAB — CBC WITH DIFFERENTIAL/PLATELET
Abs Immature Granulocytes: 0.04 10*3/uL (ref 0.00–0.07)
Basophils Absolute: 0 10*3/uL (ref 0.0–0.1)
Basophils Relative: 0 %
Eosinophils Absolute: 0.1 10*3/uL (ref 0.0–0.5)
Eosinophils Relative: 1 %
HCT: 42.9 % (ref 36.0–46.0)
Hemoglobin: 13.9 g/dL (ref 12.0–15.0)
Immature Granulocytes: 0 %
Lymphocytes Relative: 19 %
Lymphs Abs: 1.9 10*3/uL (ref 0.7–4.0)
MCH: 29.6 pg (ref 26.0–34.0)
MCHC: 32.4 g/dL (ref 30.0–36.0)
MCV: 91.5 fL (ref 80.0–100.0)
Monocytes Absolute: 0.5 10*3/uL (ref 0.1–1.0)
Monocytes Relative: 5 %
Neutro Abs: 7.5 10*3/uL (ref 1.7–7.7)
Neutrophils Relative %: 75 %
Platelets: 169 10*3/uL (ref 150–400)
RBC: 4.69 MIL/uL (ref 3.87–5.11)
RDW: 15.7 % — ABNORMAL HIGH (ref 11.5–15.5)
WBC: 10.1 10*3/uL (ref 4.0–10.5)
nRBC: 0 % (ref 0.0–0.2)

## 2023-08-06 LAB — CBC
HCT: 46.8 % — ABNORMAL HIGH (ref 36.0–46.0)
Hemoglobin: 15.3 g/dL — ABNORMAL HIGH (ref 12.0–15.0)
MCH: 29.4 pg (ref 26.0–34.0)
MCHC: 32.7 g/dL (ref 30.0–36.0)
MCV: 90 fL (ref 80.0–100.0)
Platelets: 221 10*3/uL (ref 150–400)
RBC: 5.2 MIL/uL — ABNORMAL HIGH (ref 3.87–5.11)
RDW: 15.7 % — ABNORMAL HIGH (ref 11.5–15.5)
WBC: 12.4 10*3/uL — ABNORMAL HIGH (ref 4.0–10.5)
nRBC: 0 % (ref 0.0–0.2)

## 2023-08-06 LAB — MAGNESIUM: Magnesium: 1.9 mg/dL (ref 1.7–2.4)

## 2023-08-06 MED ORDER — ATORVASTATIN CALCIUM 20 MG PO TABS
80.0000 mg | ORAL_TABLET | Freq: Every day | ORAL | Status: DC
  Administered 2023-08-07 – 2023-08-09 (×3): 80 mg via ORAL
  Filled 2023-08-06 (×3): qty 4

## 2023-08-06 MED ORDER — ALBUTEROL SULFATE (2.5 MG/3ML) 0.083% IN NEBU: 3.0000 mL | INHALATION_SOLUTION | RESPIRATORY_TRACT | Status: DC | PRN

## 2023-08-06 MED ORDER — FLUTICASONE PROPIONATE 50 MCG/ACT NA SUSP: 1.0000 | Freq: Every day | NASAL | Status: DC | PRN

## 2023-08-06 MED ORDER — ENOXAPARIN SODIUM 30 MG/0.3ML IJ SOSY
30.0000 mg | PREFILLED_SYRINGE | INTRAMUSCULAR | Status: DC
  Administered 2023-08-06 – 2023-08-07 (×2): 30 mg via SUBCUTANEOUS
  Filled 2023-08-06 (×2): qty 0.3

## 2023-08-06 MED ORDER — SODIUM CHLORIDE 0.9 % IV SOLN: INTRAVENOUS | Status: AC

## 2023-08-06 MED ORDER — ASPIRIN 81 MG PO TBEC
81.0000 mg | DELAYED_RELEASE_TABLET | Freq: Every day | ORAL | Status: DC
  Administered 2023-08-06 – 2023-08-09 (×4): 81 mg via ORAL
  Filled 2023-08-06 (×4): qty 1

## 2023-08-06 MED ORDER — ONDANSETRON HCL 4 MG/2ML IJ SOLN
4.0000 mg | Freq: Once | INTRAMUSCULAR | Status: AC
  Administered 2023-08-06: 4 mg via INTRAVENOUS
  Filled 2023-08-06: qty 2

## 2023-08-06 MED ORDER — MUSCLE RUB 10-15 % EX CREA: 1.0000 | TOPICAL_CREAM | CUTANEOUS | Status: DC | PRN

## 2023-08-06 MED ORDER — DOXEPIN HCL 10 MG PO CAPS: 10.0000 mg | ORAL_CAPSULE | Freq: Every evening | ORAL | Status: DC | PRN

## 2023-08-06 MED ORDER — HYDRALAZINE HCL 20 MG/ML IJ SOLN: 10.0000 mg | Freq: Four times a day (QID) | INTRAMUSCULAR | Status: DC | PRN

## 2023-08-06 MED ORDER — INSULIN ASPART 100 UNIT/ML IJ SOLN
0.0000 [IU] | Freq: Three times a day (TID) | INTRAMUSCULAR | Status: DC
  Administered 2023-08-07 – 2023-08-09 (×4): 1 [IU] via SUBCUTANEOUS
  Filled 2023-08-06 (×4): qty 1

## 2023-08-06 MED ORDER — SODIUM CHLORIDE 0.9 % IV BOLUS
500.0000 mL | Freq: Once | INTRAVENOUS | Status: AC
  Administered 2023-08-06: 500 mL via INTRAVENOUS

## 2023-08-06 MED ORDER — DOCUSATE SODIUM 100 MG PO CAPS
100.0000 mg | ORAL_CAPSULE | Freq: Every day | ORAL | Status: DC
  Administered 2023-08-06 – 2023-08-08 (×3): 100 mg via ORAL
  Filled 2023-08-06 (×4): qty 1

## 2023-08-06 MED ORDER — LABETALOL HCL 5 MG/ML IV SOLN: 10.0000 mg | INTRAVENOUS | Status: DC | PRN

## 2023-08-06 MED ORDER — SALINE SPRAY 0.65 % NA SOLN: 1.0000 | NASAL | Status: DC | PRN

## 2023-08-06 MED ORDER — QUETIAPINE FUMARATE 25 MG PO TABS
100.0000 mg | ORAL_TABLET | Freq: Every day | ORAL | Status: DC
  Administered 2023-08-06 – 2023-08-08 (×3): 100 mg via ORAL
  Filled 2023-08-06 (×3): qty 4

## 2023-08-06 MED ORDER — METOPROLOL TARTRATE 25 MG PO TABS
25.0000 mg | ORAL_TABLET | Freq: Two times a day (BID) | ORAL | Status: DC
  Administered 2023-08-06: 25 mg via ORAL
  Filled 2023-08-06 (×2): qty 1

## 2023-08-06 MED ORDER — PANTOPRAZOLE SODIUM 40 MG IV SOLR
40.0000 mg | Freq: Once | INTRAVENOUS | Status: AC
  Administered 2023-08-06: 40 mg via INTRAVENOUS
  Filled 2023-08-06: qty 10

## 2023-08-06 MED ORDER — METOCLOPRAMIDE HCL 5 MG/ML IJ SOLN: 10.0000 mg | Freq: Three times a day (TID) | INTRAMUSCULAR | Status: DC | PRN

## 2023-08-06 NOTE — ED Notes (Signed)
 Pt to CT

## 2023-08-06 NOTE — Telephone Encounter (Signed)
  Chief Complaint: dizziness Symptoms: dizziness, nausea, mild vomiting (2 episodes in last 24 hours), elbow injury Frequency: yesterday Pertinent Negatives: Patient denies difficulty breathing, chest pain, unilateral numbness or weakness, changes in speech or vision, blood in vomit or stool, fever Disposition: [] ED /[x] Urgent Care (no appt availability in office) / [] Appointment(In office/virtual)/ []  [place] [facility]/ [] Home Care/ [] Refused Recommended Disposition /[] [place] [facility]/ []  Follow-up with PCP Additional Notes: Patient states yesterday she was drinking quite a bit of liquids like water and soft drinks. She states she drank a protein shake this morning and had a glass of water this morning but she thinks she threw it all up this morning. Patient states she frequently suffers from dizziness if she stands up too quickly or changes positions. No acute appts available with PCP or at [facility], patient agreeable to go to urgent care. Advised on importance of staying hydrated and patient will call back for new or worsening symptoms.  Copied From CRM [id number]. Reason for Triage: nausea and a little dizzy.   Reason for Disposition  [1] MODERATE dizziness (e.g., interferes with normal activities) AND [2] has NOT been evaluated by doctor (or NP/PA) for this  (Exception: Dizziness caused by heat exposure, sudden standing, or poor fluid intake.)  Answer Assessment - Initial Assessment Questions 1. DESCRIPTION: "Describe your dizziness."     Woozy or lightheaded, she states she is holding onto the wall when she walks.  2. LIGHTHEADED: "Do you feel lightheaded?" (e.g., somewhat faint, woozy, weak upon standing)     Yes. 3. VERTIGO: "Do you feel like either you or the room is spinning or tilting?" (i.e. vertigo)     No. 4. SEVERITY: "How bad is it?"  "Do you feel like you are going to faint?" "Can you stand and walk?"   - MILD: Feels slightly dizzy, but walking normally.   - MODERATE:  Feels unsteady when walking, but not falling; interferes with normal activities (e.g., school, work).   - SEVERE: Unable to walk without falling, or requires assistance to walk without falling; feels like passing out now.      Moderate. 5. ONSET:  "When did the dizziness begin?"     Yesterday. 6. AGGRAVATING FACTORS: "Does anything make it worse?" (e.g., standing, change in head position)     Standing up or changing position. 7. HEART RATE: "Can you tell me your heart rate?" "How many beats in 15 seconds?"  (Note: not all patients can do this)       She states she doesn't check her BP or HR. 8. CAUSE: "What do you think is causing the dizziness?"     She states she takes a lot of prescription and OTC vitamins, including Mounjaro. 9. RECURRENT SYMPTOM: "Have you had dizziness before?" If Yes, ask: "When was the last time?" "What happened that time?"     Yes, she states if she gets up fast or changes positions she usually get dizzy. 10. OTHER SYMPTOMS: "Do you have any other symptoms?" (e.g., fever, chest pain, vomiting, diarrhea, bleeding)       Nausea, vomiting (1 episode last night, 1 episode this morning), fall (fell against her mirror when trying to change her sheets, injured her elbow) 11. PREGNANCY: "Is there any chance you are pregnant?" "When was your last menstrual period?"       N/A.  Protocols used: Dizziness - Lightheadedness-A-AH

## 2023-08-06 NOTE — ED Triage Notes (Signed)
 Patient presents to UC for dizziness and vomiting since yesterday. She lost her balance and hit her right elbow. Spouse has noted some changes to mental status as well. No hx of vertigo. States she is diabetic but has not checked BG. She is concerned with dehydration.   Denies chest pain, SOB, HA, numbness to extremities.

## 2023-08-06 NOTE — ED Triage Notes (Signed)
 Pt here after a fall last night. Pt states she has been dizzier than normal. Pt states she fell backwards and injured her right elbow. Pt denies hitting her head. Pt states she started vomiting shortly after. Pt also states her heartburn is getting worse.

## 2023-08-06 NOTE — ED Provider Notes (Signed)
 [facility] Provider Note    Event Date/Time   First MD Initiated Contact with Patient 08/06/23 1512     (approximate)   History   Fall   HPI  Afeni Gavidia is a 68 year old female with history of T2DM, CAD, CHF, COPD presenting to the emergency department for evaluation of weakness.  Patient got back from vacation a few days ago.  Since returning she notes that she has had some nausea with a few episodes of vomiting.  Reports pain when attempting to eat.  She additionally reports some dizziness and had a fall last night onto her right shoulder.  Denies hitting her head, LOC.  No chest pain or shortness of breath.  Reports a distant history of a gastric ulcer that resolved after stopping NSAIDs.  Started Mounjaro a few months ago, otherwise no new medication changes.      Physical Exam   Triage Vital Signs: ED Triage Vitals  Encounter Vitals Group     BP 08/06/23 1135 (!) 97/57     Systolic BP Percentile --      Diastolic BP Percentile --      Pulse Rate 08/06/23 1135 100     Resp 08/06/23 1135 18     Temp 08/06/23 1135 98.2 F (36.8 C)     Temp Source 08/06/23 1135 Oral     SpO2 08/06/23 1135 94 %     Weight 08/06/23 1140 160 lb 7.9 oz (72.8 kg)     Height 08/06/23 1140 5\' 3"  (1.6 m)     Head Circumference --      Peak Flow --      Pain Score 08/06/23 1140 0     Pain Loc --      Pain Education --      Exclude from Growth Chart --     Most recent vital signs: Vitals:   08/06/23 1135 08/06/23 1515  BP: (!) 97/57 124/76  Pulse: 100 (!) 108  Resp: 18 18  Temp: 98.2 F (36.8 C)   SpO2: 94% 100%     General: Awake, interactive  HEENT: Dry mucous membranes CV:  Mild tachycardia, good peripheral perfusion.  Resp:  Unlabored respirations.  Abd:  Nondistended, soft, mild tenderness most notably in the right upper quadrant with negative Murphy sign Neuro:  Symmetric facial movement, fluid speech   ED Results / Procedures / Treatments    Labs (all labs ordered are listed, but only abnormal results are displayed) Labs Reviewed  CBC - Abnormal; Notable for the following components:      Result Value   WBC 12.4 (*)    RBC 5.20 (*)    Hemoglobin 15.3 (*)    HCT 46.8 (*)    RDW 15.7 (*)    All other components within normal limits  COMPREHENSIVE METABOLIC PANEL WITH GFR - Abnormal; Notable for the following components:   Chloride 94 (*)    Glucose, Bld 185 (*)    Creatinine, Ser 1.91 (*)    Calcium  14.2 (*)    Total Protein 8.4 (*)    AST 42 (*)    ALT 45 (*)    GFR, Estimated 28 (*)    Anion gap 17 (*)    All other components within normal limits  MAGNESIUM  HIV ANTIBODY (ROUTINE TESTING W REFLEX)  CBC  COMPREHENSIVE METABOLIC PANEL WITH GFR  CBC WITH DIFFERENTIAL/PLATELET  MAGNESIUM  CBC WITH DIFFERENTIAL/PLATELET  COMPREHENSIVE METABOLIC PANEL WITH GFR  PHOSPHORUS  CALCIUM , IONIZED  PARATHYROID HORMONE, INTACT (NO CA)  HEMOGLOBIN A1C     EKG EKG independently reviewed interpreted by myself (ER attending) demonstrates:    RADIOLOGY Imaging independently reviewed and interpreted by myself demonstrates:  CT head without acute bleed CT abdomen pelvis with findings suggestive of esophagitis  Formal Radiology Read:  CT ABDOMEN PELVIS WO CONTRAST Result Date: 08/06/2023 CLINICAL DATA:  Acute abdominal pain a EXAM: CT ABDOMEN AND PELVIS WITHOUT CONTRAST TECHNIQUE: Multidetector CT imaging of the abdomen and pelvis was performed following the standard protocol without IV contrast. RADIATION DOSE REDUCTION: This exam was performed according to the departmental dose-optimization program which includes automated exposure control, adjustment of the mA and/or kV according to patient size and/or use of iterative reconstruction technique. COMPARISON:  CT abdomen and pelvis 02/06/2010 FINDINGS: Lower chest: No acute abnormality. Hepatobiliary: No focal liver abnormality is seen. No gallstones, gallbladder wall  thickening, or biliary dilatation. Pancreas: Unremarkable. No pancreatic ductal dilatation or surrounding inflammatory changes. Spleen: Normal in size without focal abnormality. Adrenals/Urinary Tract: Adrenal glands are unremarkable. Kidneys are normal, without renal calculi, focal lesion, or hydronephrosis. Bladder is unremarkable. Stomach/Bowel: The stomach is distended with a large amount of debris. There is wall thickening of the distal esophagus with mild surrounding inflammatory stranding and fluid. No evidence of bowel wall thickening, distention, or inflammatory changes. There is sigmoid colon diverticulosis. The appendix is not visualized. Vascular/Lymphatic: Aortic atherosclerosis. No enlarged abdominal or pelvic lymph nodes. Reproductive: Uterus and bilateral adnexa are unremarkable. Other: No abdominal wall hernia or abnormality. No abdominopelvic ascites. Musculoskeletal: No acute or significant osseous findings. IMPRESSION: 1. Wall thickening of the distal esophagus with surrounding inflammatory stranding and fluid compatible with esophagitis. 2. The stomach is distended with a large amount of debris. Correlate clinically. 3. Sigmoid colon diverticulosis. 4. Aortic atherosclerosis. Aortic Atherosclerosis (ICD10-I70.0). Electronically Signed   By: Tyron Gallon M.D.   On: 08/06/2023 16:24   CT HEAD WO CONTRAST ( ) Result Date: 08/06/2023 CLINICAL DATA:  Syncope/presyncope. Cerebrovascular cause suspected. Dizziness. EXAM: CT HEAD WITHOUT CONTRAST TECHNIQUE: Contiguous axial images were obtained from the base of the skull through the vertex without intravenous contrast. RADIATION DOSE REDUCTION: This exam was performed according to the departmental dose-optimization program which includes automated exposure control, adjustment of the mA and/or kV according to patient size and/or use of iterative reconstruction technique. COMPARISON:  MRI 10/18/2021 FINDINGS: Brain: No sign of acute or subacute  stroke. No mass, hemorrhage, hydrocephalus or extra-axial collection. Minimal small vessel changes of the white matter shown by MRI are not appreciable by CT. Vascular: No abnormal vascular finding. Skull: Normal Sinuses/Orbits: Clear/normal Other: None IMPRESSION: Normal head CT. No abnormality seen to explain the presenting symptoms. Electronically Signed   By: Bettylou Brunner M.D.   On: 08/06/2023 12:09    PROCEDURES:  Critical Care performed: Yes, see critical care procedure note(s)  CRITICAL CARE Performed by: Claria Crofts   Total critical care time: 31 minutes  Critical care time was exclusive of separately billable procedures and treating other patients.  Critical care was necessary to treat or prevent imminent or life-threatening deterioration.  Critical care was time spent personally by me on the following activities: development of treatment plan with patient and/or surrogate as well as nursing, discussions with consultants, evaluation of patient's response to treatment, examination of patient, obtaining history from patient or surrogate, ordering and performing treatments and interventions, ordering and review of laboratory studies, ordering and review of radiographic studies, pulse oximetry and re-evaluation of  patient's condition.   Procedures   MEDICATIONS ORDERED IN ED: Medications  enoxaparin  (LOVENOX ) injection 30 mg (has no administration in time range)  labetalol (NORMODYNE) injection 10 mg (has no administration in time range)  hydrALAZINE  (APRESOLINE ) injection 10 mg (has no administration in time range)  Muscle Rub CREA 1 Application (has no administration in time range)  sodium chloride  (OCEAN) 0.65 % nasal spray 1 spray (has no administration in time range)  insulin  aspart (novoLOG ) injection 0-9 Units (has no administration in time range)  0.9 %  sodium chloride  infusion (has no administration in time range)  metoCLOPramide (REGLAN) injection 10 mg (has no  administration in time range)  docusate sodium  (COLACE) capsule 100 mg (100 mg Oral Given 08/06/23 1845)  aspirin  EC tablet 81 mg (81 mg Oral Given 08/06/23 1845)  albuterol  (PROVENTIL ) (2.5 MG/3ML) 0.083% nebulizer solution 3 mL (has no administration in time range)  atorvastatin  (LIPITOR) tablet 80 mg (has no administration in time range)  doxepin  (SINEQUAN ) capsule 10 mg (has no administration in time range)  fluticasone  (FLONASE ) 50 MCG/ACT nasal spray 1 spray (has no administration in time range)  metoprolol  tartrate (LOPRESSOR ) tablet 25 mg (has no administration in time range)  QUEtiapine  (SEROQUEL ) tablet 100 mg (has no administration in time range)  sodium chloride  0.9 % bolus 500 mL (500 mLs Intravenous New Bag/Given 08/06/23 1653)  ondansetron  (ZOFRAN ) injection 4 mg (4 mg Intravenous Given 08/06/23 1652)  pantoprazole  (PROTONIX ) injection 40 mg (40 mg Intravenous Given 08/06/23 1845)     IMPRESSION / MDM / ASSESSMENT AND PLAN / ED COURSE  I reviewed the triage vital signs and the nursing notes.  Differential diagnosis includes, but is not limited to, intracranial bleed, dehydration, anemia, electrolyte abnormality, biliary pathology, other acute intra-abdominal process  Patient's presentation is most consistent with acute presentation with potential threat to life or bodily function.  68 year old female presenting with weakness.  Soft blood pressure initially, improved without intervention.  Labs from triage notable for  CBC with possible hemoconcentration with elevated white blood cell count and hemoglobin.  CMP notable for critical hypercalcemia at 14.2.  Patient denies history of similar.  Also with AKI with creatinine of 1.9, baseline appears around 0.9.  CT head without acute findings.  CT abdomen pelvis demonstrates findings of esophagitis.  Suspect this is contributing to patient's decreased p.o. intake with resultant dehydration contributing to her hypercalcemia.  She does report what  sounds like a history of an esophageal dilation in the past.  Will give saline bolus here, but will likely require continued IV fluids given her critical calcium .  Also ordered for Zofran  and Protonix .  Do think she is appropriate for admission for further evaluation.  Will reach out to hospitalist team.  Clinical Course as of 08/06/23 1846  Tue Aug 06, 2023  1720 Discussed with hospitalist team. They will evaluate for anticipated admission.  [NR]    Clinical Course User Index [NR] Claria Crofts, MD     FINAL CLINICAL IMPRESSION(S) / ED DIAGNOSES   Final diagnoses:  Generalized weakness  Hypercalcemia  Acute kidney injury (HCC)     Rx / DC Orders   ED Discharge Orders     None        Note:  This document was prepared using Dragon voice recognition software and may include unintentional dictation errors.   Claria Crofts, MD 08/06/23 [phone]

## 2023-08-06 NOTE — ED Notes (Signed)
 Patient is being discharged from the Urgent Care and sent to the Emergency Department via POV with spouse. Per White, NP, patient is in need of higher level of care due to dizziness. Patient is aware and verbalizes understanding of plan of care.  Vitals:   08/06/23 1025  BP: (S) (!) 91/57  Pulse: (!) 117  Resp: 16  SpO2: 92%

## 2023-08-06 NOTE — ED Notes (Signed)
 Critical Result: Calcium  14.2  Wells, MD made aware

## 2023-08-06 NOTE — H&P (Addendum)
 History and Physical    Patient: Katelyn Cooper UXL:[id number] DOB: 10/31/1955 DOA: 08/06/2023 DOS: the patient was seen and examined on 08/06/2023 PCP: Lamon Pillow, MD  Patient coming from: Home  Chief Complaint:  Chief Complaint  Patient presents with   Fall   HPI: Katelyn Cooper is a 68 y.o. female with medical history significant of type 2 diabetes on mounjaro since 05/2023, depression, anxiety, CAD, CHF, COPD, OSA, history of gastric ulcer, who presents to the emergency department for evaluation of generalized weakness.  Patient reports that she just got back from vacation in [place]  on 5/1. She reports not long after getting back she was feeling fatigued but assumed it was due to travel. She began feeling achey all over. She went to bed Monday night and woke up vomiting. When she was changing the sheets she reports she fell back against the wall and hit her right elbow. She did not hit her head or lose consciousness. This morning she attempted a meal replacement drink and she began vomiting again so presented to urgent care, who urged her to come to ED instead.   At present she denies any nausea, chest pain, dyspnea. She denies abdominal pain or bloating sensation but notes that she has no appetite.  In the ED labs are notable for AKI, severe hypercalcemia >14, and CT with distended stomach. She is mildly tachycardic. She was give 500cc NS.  Review of Systems: As mentioned in the history of present illness. All other systems reviewed and are negative. Past Medical History:  Diagnosis Date   (HFpEF) heart failure with preserved ejection fraction (HCC)    a. 12/2015 Echo: EF 55-60%, nl RV size/fxn, no significant valvular abnormalities; b. 10/2017 Echo: EF 50-55%, antsept, ant HK. Gr2 DD. Mild MR. Nl RV fxn. PASP .   Actinic keratosis    Anxiety    Basal cell carcinoma 08/08/2017   L nose above mid nasal alar groove   CHF (congestive heart failure) (HCC)    Depression     Diabetes type 2, controlled (HCC)    diet-controlled   Esophageal stricture    GERD (gastroesophageal reflux disease)    Hiatal hernia    History of chicken pox    Hyperlipidemia    Hypertension    Internal hemorrhoids    Non-obstructive CAD (coronary artery disease)    a. 12/2015 MV: apical ant and apical reversible defect. EF 55-65%; b. 01/2016 Cath: LM nl, LAD nl, SP1 40ost, LCX nl, OM1 30, RCA 30p/m, EDP 15-74mmHg; c. 10/2017 Cath: LM nl, LAD nl, SP1 30ost, LCX nl, OM1 30, RCA 30p/m. EF 55%.   Obstructive sleep apnea    PAD (peripheral artery disease) (HCC)    a. 1990's s/p prior LCIA stenting; b. 12/2015 ABI: R 1.05, L 1.03.   Panic disorder    Past Surgical History:  Procedure Laterality Date   AORTA SURGERY  1999   stent placement; ? left iliac artery intervention   APPENDECTOMY  1998   BASAL CELL CARCINOMA EXCISION     C5 - fusion N/A 10/10/2021   CARDIAC CATHETERIZATION N/A 01/12/2016   Procedure: Left Heart Cath and Coronary Angiography;  Surgeon: Sammy Crisp, MD;  Location: [facility] INVASIVE CV LAB;  Service: Cardiovascular;  Laterality: N/A;   COLONOSCOPY  07/17/2019   FOOT SURGERY     Right   pap smear  03/2010   Done by Dr. Renea Carrion, normal   REFRACTIVE SURGERY     right  RIB RESECTION  [id number]   RIGHT/LEFT HEART CATH AND CORONARY ANGIOGRAPHY N/A 11/08/2017   Procedure: RIGHT/LEFT HEART CATH AND CORONARY ANGIOGRAPHY;  Surgeon: Wenona Hamilton, MD;  Location: [facility] INVASIVE CV LAB;  Service: Cardiovascular;  Laterality: N/A;   SHOULDER SURGERY  2005   left   UPPER GASTROINTESTINAL ENDOSCOPY  07/17/2019   UPPER GI ENDOSCOPY  09/19/2011   Stricture at GE junction, dilated. Mild gastritis and duodenitis. Small hiatal hernia   Social History:  reports that she quit smoking about 8 months ago. Her smoking use included cigarettes. She has never used smokeless tobacco. She reports that she does not drink alcohol and does not use drugs.  Allergies  Allergen  Reactions   Lovastatin     depression   Paroxetine Dermatitis   Paroxetine Hcl     itching   Lamotrigine Rash    Family History  Problem Relation Age of Onset   Lung cancer Mother        was a smoker   Emphysema Mother    Cancer Mother        Lung   Alcohol abuse Mother    Depression Mother    Heart disease Mother    Drug abuse Sister    Breast cancer Sister        x 2  (30&50)   Emphysema Sister    Bipolar disorder Sister    Alcohol abuse Other    Depression Other    Bipolar disorder Other    Heart disease Other    Vision loss Other    Alcohol abuse Father    Mood Disorder Father    Heart disease Maternal Grandmother    Stroke Maternal Grandmother    Esophageal cancer Maternal Aunt    Colon cancer Neg Hx    Colon polyps Neg Hx    Rectal cancer Neg Hx    Stomach cancer Neg Hx     Prior to Admission medications   Medication Sig Start Date End Date Taking? Authorizing Provider  acetaminophen  (TYLENOL ) 500 MG tablet Take 500 mg by mouth every 6 (six) hours as needed.    [provider]  albuterol  (VENTOLIN  HFA) 108 (90 Base) MCG/ACT inhaler inhale 2 puffs by mouth every 6 hours if needed for wheezing or shortness of breath 09/26/22   Cleve Dale, MD  aspirin  EC 81 MG tablet Take 1 tablet (81 mg total) by mouth daily. 12/12/15   End, Veryl Gottron, MD  atorvastatin  (LIPITOR) 80 MG tablet Take 1 tablet by mouth daily. 06/12/23   End, Veryl Gottron, MD  b complex vitamins capsule Take 1 capsule by mouth daily.    [provider]  Cholecalciferol  (VITAMIN D3) 5000 units TABS Take 1 tablet by mouth daily.  04/09/16   [provider]  cyclobenzaprine  (FLEXERIL ) 10 MG tablet Take 10 mg by mouth 3 (three) times daily.    [provider]  doxepin  (SINEQUAN ) 10 MG capsule Take 2 capsules (20 mg total) by mouth at bedtime as needed. Patient taking differently: Take 10 mg by mouth at bedtime as needed. 04/19/23 07/18/23  Todd Fossa, MD  ezetimibe   (ZETIA ) 10 MG tablet Take 1 tablet by mouth daily. 06/05/23   Roark Chick, PA-C  fluticasone  (FLONASE ) 50 MCG/ACT nasal spray SPRAY 2 SPRAYS INTO EACH NOSTRIL EVERY DAY 05/23/23   Lamon Pillow, MD  furosemide  (LASIX ) 20 MG tablet Take 1 tablet by mouth every other day and may take 1 tablet by mouth  as needed  for increased fluid or weight gain on days that you would normally not take a tablet. 04/18/23   Dunn, Elvia Hammans, PA-C  glucose blood test strip ONETOUCH ULTRA MINI STRIPS AND LANCETS. Use one daily to check blood sugar. 11/18/17   Lamon Pillow, MD  lansoprazole  (PREVACID ) 15 MG capsule Take 15 mg by mouth daily at 6 (six) AM.    [provider]  metFORMIN  (GLUCOPHAGE -XR) 500 MG 24 hr tablet Take 1 tablet (500 mg total) by mouth 2 (two) times daily. 02/27/23   Lamon Pillow, MD  metoprolol  tartrate (LOPRESSOR ) 25 MG tablet Take 1 tablet by mouth twice daily. 05/08/23   Roark Chick, PA-C  montelukast  (SINGULAIR ) 10 MG tablet TAKE 1 TABLET BY MOUTH EVERYDAY AT BEDTIME 02/20/23   Lamon Pillow, MD  Multiple Vitamins-Minerals (MULTI-VITAMIN GUMMIES PO) Take by mouth. Taking 1 daily    [provider]  potassium chloride  20 MEQ/15ML (10%) SOLN Take 7.5 mLs (10 mEq total) by mouth daily as needed (with furosemide ). 11/15/22   Dunn, Elvia Hammans, PA-C  QUEtiapine  (SEROQUEL ) 100 MG tablet Take 1 tablet (100 mg total) by mouth at bedtime. 05/15/23 08/13/23  Hisada, Reina, MD  tirzepatide [facility]) 2.5 MG/0.5ML Pen Inject 2.5 mg into the skin once a week. 05/20/23   Lamon Pillow, MD  tirzepatide [facility]) 5 MG/0.5ML Pen Inject 5 mg into the skin once a week. 06/24/23   Lamon Pillow, MD  venlafaxine  XR (EFFEXOR -XR) 75 MG 24 hr capsule Take 1 capsule (75 mg total) by mouth daily with breakfast. 08/16/23 11/14/23  Todd Fossa, MD    Physical Exam: Vitals:   08/06/23 1135 08/06/23 1140 08/06/23 1515  BP: (!) 97/57  124/76  Pulse: 100  (!) 108  Resp: 18  18  Temp: 98.2 F (36.8 C)     TempSrc: Oral    SpO2: 94%  100%  Weight:  72.8 kg   Height:  5\' 3"  (1.6 m)    Constitutional:  Normal appearance. Non toxic-appearing.  HENT: Head Normocephalic and atraumatic.  Mucous membranes are moist.  Eyes:  Extraocular intact. Conjunctivae normal. Pupils are equal, round, and reactive to light.  Cardiovascular: Rate and Rhythm: Normal rate and regular rhythm.  Pulmonary: Non labored, symmetric rise of chest wall.  Abdomen: appears bloated, soft, nontender to palpation Musculoskeletal:  Normal range of motion.  Skin: warm and dry. not jaundiced.  Neurological: No focal deficit present. alert. Oriented. Psychiatric: Mood and Affect congruent.   Data Reviewed:     Latest Ref Rng & Units 08/06/2023   11:42 AM 10/31/2021    3:15 PM 10/19/2021    4:06 AM  CBC  WBC 4.0 - 10.5 K/uL 12.4  11.1  7.9   Hemoglobin 12.0 - 15.0 g/dL 04.5  40.9  81.1   Hematocrit 36.0 - 46.0 % 46.8  43.0  40.6   Platelets 150 - 400 K/uL 221  275  159       Latest Ref Rng & Units 08/06/2023   11:42 AM 02/25/2023   10:16 AM 05/08/2022    9:14 AM  BMP  Glucose 70 - 99 mg/dL 914  782  956   BUN 8 - 23 mg/dL 20  12  8    Creatinine 0.44 - 1.00 mg/dL 2.13  0.86  5.78   BUN/Creat Ratio 12 - 28  13    Sodium 135 - 145 mmol/L 135  141  140   Potassium 3.5 - 5.1 mmol/L 3.7  4.3  4.1   Chloride 98 - 111 mmol/L 94  98  103   CO2 22 - 32 mmol/L 24  23  26    Calcium  8.9 - 10.3 mg/dL 16.1  9.9  9.3    CT-scan of abdomen and pelvis reviewed  Assessment and Plan: Hypercalcemia - Unclear etiology.  Albumin within normal limits. - Home med review includes multiple supplements including: vitamin D, B complex vitamins, multivitamin. She believes she takes additional supplements that are not listed here but cannot recall their names. This may be contributing. - Suspect exacerbated by dehydration though unlikely primary cause - Calcium  within normal limits on most recent labs in November 2024. - CT abdomen pelvis  unremarkable - Started on normal saline. Repeat CMP ordered. Zoledronic Acid initiation per results of repeat. - Ionized calcium , PTH ordered and pending - No personal history of malignancy that patient is aware of, does endorse family history of cancer. -- CT Ab/pelv without evidence of malignancy. Low dose Lung CT 3/17 with one small nodule, on annual surveillance.   Distended stomach with large amount of debris - Seen on CT scan - Suspect this is gastroparesis.  Patient is on Mounjaro which she reports she has been tolerating since 05/2023. On 2.5mg  with no recent increases - Discontinue Mounjaro, has prolonged washout period. - As needed Reglan for ongoing nausea vomiting  Fall At home -- Mechanical, no LOC -- Head CT WNL  AKI Creatinine 1.9, at baseline 0.93 - Patient endorses poor p.o. intake.  Suspect this is prerenal secondary to dehydration 2/2 vomiting - Has been receiving IV fluids in the ED. NS ordered as above - Repeat CMP pending - Continue IV fluids as above  Sigmoid diverticulosis - No evidence of diverticulitis - Incidentally seen on CT - Pt endorses last BM yesterday.  Esophagitis - Wall thickening of distal esophagus with surrounding inflammatory stranding and fluid compatible with esophagitis - Pantoprazole  ordered. Pt takes daily PPI at home - Patient endorses history of gastric ulcer that resolved after discontinuation of NSAIDs.  Tachycardia - Likely secondary to dehydration and hypercalcemia -- monitor on cardiac tele   LFT elevation - AST 42, ALT 45, total protein 8.4 - CT abdomen pelvis without hepatobiliary abnormalities - LFT elevation appears chronic when compared to labs from November 2024. Pt unaware if she has had work up for this in the past.  Type 2 diabetes - On Mounjaro at home.  Discontinue given concerns of gastroparesis as above - Continue with sliding scale insulin , titrate up as tolerated - Hemoglobin A1c 8.0% from November 2024,  repeat pening  CAD - Resume home meds  CHF - Hypovolemic on Exam -- Most recent ECHO 2023 LVEF 55 to 60%.. Septal wall hypokinesis consistent with bundle branch block. Left ventricular diastolic parameters were normal. -- Repeat ECHO this admission given she will likely require high volume NS given hypercalcemia -- Pt follows with [facility]  COPD - Not currently in exacerbation - Resume home meds - As needed nebulizers  OSA - Have ordered for CPAP at night  Anxiety Depression -- resume home meds   Advance Care Planning:   Code Status: Full Code Patient endorses desire to be full code. She would like full resuscitation and intubation.  Consults: Consider renal consult in AM  Family Communication: None at bedside. Discussed directly with patient   Severity of Illness: The appropriate patient status for this patient is OBSERVATION. Observation status is judged to be reasonable and necessary in order to  provide the required intensity of service to ensure the patient's safety. The patient's presenting symptoms, physical exam findings, and initial radiographic and laboratory data in the context of their medical condition is felt to place them at decreased risk for further clinical deterioration. Furthermore, it is anticipated that the patient will be medically stable for discharge from the hospital within 2 midnights of admission.   Author: Sharmel Ballantine, DO 08/06/2023 5:25 PM  For on call review [email or web address].

## 2023-08-06 NOTE — ED Notes (Signed)
 Pt transported to new room at this time after a fall at home. Pt says that she has been more dizzy than normal and ended up falling backwards. Pt was endorsing nausea/vomiting, but not at this time. Pt ABCs intact. RR even and unlabored. Pt in NAD. Bed in lowest locked position. Call bell in reach. Denies needs at this time.    Past Medical History:  Diagnosis Date   (HFpEF) heart failure with preserved ejection fraction (HCC)    a. 12/2015 Echo: EF 55-60%, nl RV size/fxn, no significant valvular abnormalities; b. 10/2017 Echo: EF 50-55%, antsept, ant HK. Gr2 DD. Mild MR. Nl RV fxn. PASP .   Actinic keratosis    Anxiety    Basal cell carcinoma 08/08/2017   L nose above mid nasal alar groove   CHF (congestive heart failure) (HCC)    Depression    Diabetes type 2, controlled (HCC)    diet-controlled   Esophageal stricture    GERD (gastroesophageal reflux disease)    Hiatal hernia    History of chicken pox    Hyperlipidemia    Hypertension    Internal hemorrhoids    Non-obstructive CAD (coronary artery disease)    a. 12/2015 MV: apical ant and apical reversible defect. EF 55-65%; b. 01/2016 Cath: LM nl, LAD nl, SP1 40ost, LCX nl, OM1 30, RCA 30p/m, EDP 15-8mmHg; c. 10/2017 Cath: LM nl, LAD nl, SP1 30ost, LCX nl, OM1 30, RCA 30p/m. EF 55%.   Obstructive sleep apnea    PAD (peripheral artery disease) (HCC)    a. 1990's s/p prior LCIA stenting; b. 12/2015 ABI: R 1.05, L 1.03.   Panic disorder

## 2023-08-06 NOTE — ED Notes (Signed)
 Pt reconnected to monitor and fluids after using bedside commode. Pt ABCs intact. RR even and unlabored. Pt in NAD. Bed in lowest locked position. Call bell in reach. Denies needs at this time.

## 2023-08-06 NOTE — ED Notes (Signed)
 Pt ABCs intact. RR even and unlabored. Pt in NAD. Bed in lowest locked position. Call bell in reach. Denies needs at this time.

## 2023-08-07 ENCOUNTER — Inpatient Hospital Stay
Admit: 2023-08-07 | Discharge: 2023-08-07 | Disposition: A | Discharge status: Home / Self Care | Attending: Family Medicine

## 2023-08-07 ENCOUNTER — Other Ambulatory Visit: Payer: Self-pay | Admitting: Psychiatry

## 2023-08-07 DIAGNOSIS — E119 Type 2 diabetes mellitus without complications: Secondary | ICD-10-CM | POA: Diagnosis not present

## 2023-08-07 DIAGNOSIS — W19XXXA Unspecified fall, initial encounter: Secondary | ICD-10-CM | POA: Diagnosis present

## 2023-08-07 DIAGNOSIS — E785 Hyperlipidemia, unspecified: Secondary | ICD-10-CM | POA: Diagnosis present

## 2023-08-07 DIAGNOSIS — I10 Essential (primary) hypertension: Secondary | ICD-10-CM | POA: Diagnosis not present

## 2023-08-07 DIAGNOSIS — I5032 Chronic diastolic (congestive) heart failure: Secondary | ICD-10-CM

## 2023-08-07 DIAGNOSIS — I700 Atherosclerosis of aorta: Secondary | ICD-10-CM | POA: Diagnosis present

## 2023-08-07 DIAGNOSIS — F32A Depression, unspecified: Secondary | ICD-10-CM | POA: Diagnosis present

## 2023-08-07 DIAGNOSIS — E1143 Type 2 diabetes mellitus with diabetic autonomic (poly)neuropathy: Secondary | ICD-10-CM | POA: Diagnosis present

## 2023-08-07 DIAGNOSIS — E1151 Type 2 diabetes mellitus with diabetic peripheral angiopathy without gangrene: Secondary | ICD-10-CM | POA: Diagnosis present

## 2023-08-07 DIAGNOSIS — Z7984 Long term (current) use of oral hypoglycemic drugs: Secondary | ICD-10-CM | POA: Diagnosis not present

## 2023-08-07 DIAGNOSIS — Z8711 Personal history of peptic ulcer disease: Secondary | ICD-10-CM | POA: Diagnosis not present

## 2023-08-07 DIAGNOSIS — N179 Acute kidney failure, unspecified: Secondary | ICD-10-CM

## 2023-08-07 DIAGNOSIS — I454 Nonspecific intraventricular block: Secondary | ICD-10-CM | POA: Diagnosis present

## 2023-08-07 DIAGNOSIS — Z8249 Family history of ischemic heart disease and other diseases of the circulatory system: Secondary | ICD-10-CM | POA: Diagnosis not present

## 2023-08-07 DIAGNOSIS — T452X5A Adverse effect of vitamins, initial encounter: Secondary | ICD-10-CM | POA: Diagnosis present

## 2023-08-07 DIAGNOSIS — J449 Chronic obstructive pulmonary disease, unspecified: Secondary | ICD-10-CM | POA: Diagnosis present

## 2023-08-07 DIAGNOSIS — Z87891 Personal history of nicotine dependence: Secondary | ICD-10-CM | POA: Diagnosis not present

## 2023-08-07 DIAGNOSIS — I2510 Atherosclerotic heart disease of native coronary artery without angina pectoris: Secondary | ICD-10-CM | POA: Diagnosis present

## 2023-08-07 DIAGNOSIS — Z825 Family history of asthma and other chronic lower respiratory diseases: Secondary | ICD-10-CM | POA: Diagnosis not present

## 2023-08-07 DIAGNOSIS — I951 Orthostatic hypotension: Secondary | ICD-10-CM | POA: Diagnosis present

## 2023-08-07 DIAGNOSIS — E860 Dehydration: Secondary | ICD-10-CM | POA: Diagnosis present

## 2023-08-07 DIAGNOSIS — Z7985 Long-term (current) use of injectable non-insulin antidiabetic drugs: Secondary | ICD-10-CM | POA: Diagnosis not present

## 2023-08-07 DIAGNOSIS — F419 Anxiety disorder, unspecified: Secondary | ICD-10-CM | POA: Diagnosis present

## 2023-08-07 DIAGNOSIS — I110 Hypertensive heart disease with heart failure: Secondary | ICD-10-CM | POA: Diagnosis present

## 2023-08-07 DIAGNOSIS — G4733 Obstructive sleep apnea (adult) (pediatric): Secondary | ICD-10-CM | POA: Diagnosis present

## 2023-08-07 DIAGNOSIS — Z85828 Personal history of other malignant neoplasm of skin: Secondary | ICD-10-CM | POA: Diagnosis not present

## 2023-08-07 LAB — COMPREHENSIVE METABOLIC PANEL WITH GFR
ALT: 40 U/L (ref 0–44)
AST: 40 U/L (ref 15–41)
Albumin: 3.7 g/dL (ref 3.5–5.0)
Alkaline Phosphatase: 79 U/L (ref 38–126)
Anion gap: 12 (ref 5–15)
BUN: 21 mg/dL (ref 8–23)
CO2: 28 mmol/L (ref 22–32)
Calcium: 13.3 mg/dL (ref 8.9–10.3)
Chloride: 96 mmol/L — ABNORMAL LOW (ref 98–111)
Creatinine, Ser: 1.89 mg/dL — ABNORMAL HIGH (ref 0.44–1.00)
GFR, Estimated: 28 mL/min — ABNORMAL LOW (ref >60)
Glucose, Bld: 124 mg/dL — ABNORMAL HIGH (ref 70–99)
Potassium: 3.6 mmol/L (ref 3.5–5.1)
Sodium: 136 mmol/L (ref 135–145)
Total Bilirubin: 0.8 mg/dL (ref 0.0–1.2)
Total Protein: 7.5 g/dL (ref 6.5–8.1)

## 2023-08-07 LAB — CBC WITH DIFFERENTIAL/PLATELET
Abs Immature Granulocytes: 0.03 10*3/uL (ref 0.00–0.07)
Basophils Absolute: 0 10*3/uL (ref 0.0–0.1)
Basophils Relative: 0 %
Eosinophils Absolute: 0.2 10*3/uL (ref 0.0–0.5)
Eosinophils Relative: 3 %
HCT: 37.9 % (ref 36.0–46.0)
Hemoglobin: 12.3 g/dL (ref 12.0–15.0)
Immature Granulocytes: 0 %
Lymphocytes Relative: 27 %
Lymphs Abs: 1.9 10*3/uL (ref 0.7–4.0)
MCH: 30.1 pg (ref 26.0–34.0)
MCHC: 32.5 g/dL (ref 30.0–36.0)
MCV: 92.7 fL (ref 80.0–100.0)
Monocytes Absolute: 0.4 10*3/uL (ref 0.1–1.0)
Monocytes Relative: 6 %
Neutro Abs: 4.5 10*3/uL (ref 1.7–7.7)
Neutrophils Relative %: 64 %
Platelets: 142 10*3/uL — ABNORMAL LOW (ref 150–400)
RBC: 4.09 MIL/uL (ref 3.87–5.11)
RDW: 15.9 % — ABNORMAL HIGH (ref 11.5–15.5)
WBC: 7.1 10*3/uL (ref 4.0–10.5)
nRBC: 0 % (ref 0.0–0.2)

## 2023-08-07 LAB — CBG MONITORING, ED
Glucose-Capillary: 116 mg/dL — ABNORMAL HIGH (ref 70–99)
Glucose-Capillary: 130 mg/dL — ABNORMAL HIGH (ref 70–99)
Glucose-Capillary: 100 mg/dL — ABNORMAL HIGH (ref 70–99)
Glucose-Capillary: 111 mg/dL — ABNORMAL HIGH (ref 70–99)

## 2023-08-07 LAB — HIV ANTIBODY (ROUTINE TESTING W REFLEX): HIV Screen 4th Generation wRfx: NONREACTIVE

## 2023-08-07 LAB — HEMOGLOBIN A1C
Hgb A1c MFr Bld: 6.6 % — ABNORMAL HIGH (ref 4.8–5.6)
Mean Plasma Glucose: 142.72 mg/dL

## 2023-08-07 NOTE — Assessment & Plan Note (Addendum)
 08-07-2023 unclear the cause of her hypercalcemia. Mildly improved with IVF. PTH pending. Will add vit D levels. Asked husband to bring in all of pt's pill bottles so it can be checked for vit D doses and calcium  doses. May need neoplastic workup of this doesn't show anything.  08-08-2023 likely due to vitamin D  toxicity along with overuse of calcium  carbonate.  Awaiting vitamin D  labs.  08-09-2023 vitamin D  25-hydroxy is normal. Her Calcitriol level is still pending. She can f/u with PCP on that lab. Stop all vitamins and calcium . Bid protonix  and carafate for GERD. Stable for DC to home.

## 2023-08-07 NOTE — ED Notes (Signed)
 Pt resting comfortably in bed at this time. Pt ABCs intact. RR even and unlabored. Pt in NAD. Bed in lowest locked position. Call bell in reach. Denies needs at this time.

## 2023-08-07 NOTE — Assessment & Plan Note (Addendum)
 08-07-2023 on lopressor . Holding lasix  due to AKI. Repeat echo ordered. Last echo in 2023 showed LVEF 55%  08-08-2023 lopressor  on hold due to orthostatic hypotension yesterday.  08-09-2023 stable. Restart lopressor . Hold lasix  at discharge for now.

## 2023-08-07 NOTE — Assessment & Plan Note (Addendum)
 08-07-2023 stable. On lopressor .  08-09-2023 stable. Restart lopressor .

## 2023-08-07 NOTE — Progress Notes (Signed)
 Attempted 2D echocardiogram. Patient with physical therapy.

## 2023-08-07 NOTE — Assessment & Plan Note (Addendum)
 08-07-2023 on SSI.  08-08-2023 stable  08-09-2023 stable

## 2023-08-07 NOTE — Evaluation (Signed)
 Physical Therapy Evaluation Patient Details Name: Katelyn Cooper MRN: [id number] DOB: [birth year] Today's Date: 08/07/2023  History of Present Illness  68 y.o. female with medical history significant of type 2 diabetes on mounjaro since 05/2023, depression, anxiety, CAD, CHF, COPD, OSA, history of gastric ulcer, who presents to the emergency department for evaluation of generalized weakness.  Patient reports that she just got back from vacation in [place]  on 5/1. She reports not long after getting back she was feeling fatigued but assumed it was due to travel. She began feeling achey all over. She went to bed Monday night and woke up vomiting. When she was changing the sheets she reports she fell back against the wall and hit her right elbow.  Clinical Impression  Pt pleasant and very eager to get up and see how she can do but she had issues with BP/lightheadedness.  orthostatics during our session: supine 101/48, seated 80/66, standing 52/42, symptomatic during all transitions - pt needing to sit down on first 2 standing attempts after ~30 seconds due to lightheadedness.  Pt was never the less determined to do at least some walking, managed ~25 ft of very slow, guarded position ambulation that was far from her baseline.  Pt wishing to avoid STR, should be able to do so per continued improvement with vitals and a more productive ambulation.  Pt will benefit from continued PT to address functional limitations.        If plan is discharge home, recommend the following: Assist for transportation;Assistance with cooking/housework;A little help with walking and/or transfers   Can travel by private vehicle        Equipment Recommendations Other (comment) (likely will not need per progress)  Recommendations for Other Services       Functional Status Assessment Patient has had a recent decline in their functional status and demonstrates the ability to make significant improvements in function in a  reasonable and predictable amount of time.     Precautions / Restrictions Precautions Precautions: Fall Restrictions Weight Bearing Restrictions Per Provider Order: No      Mobility  Bed Mobility Overal bed mobility: Modified Independent                  Transfers Overall transfer level: Modified independent Equipment used: None               General transfer comment: uses arms to push up, needed to sit on first 2 attempts after ~30 seconds 2/2 dizziness    Ambulation/Gait Ambulation/Gait assistance: Contact guard assist, Min assist Gait Distance (Feet): 25 Feet Assistive device: None         General Gait Details: Pt with extremely slow and guarded gait that is far from her baseline.  Despite 2 episodes of feeling light headed and cuing that her BP did drop significantly with standing she was still motivated to do at least some walking.  Ultimately, on 3rd stance, did not need to sit and made attempt to get to the door and back; no LOBs but this was difficult for her today  Stairs            Wheelchair Mobility     Tilt Bed    Modified Rankin (Stroke Patients Only)       Balance Overall balance assessment: Needs assistance Sitting-balance support: No upper extremity supported Sitting balance-Leahy Scale: Normal     Standing balance support: No upper extremity supported, Single extremity supported Standing balance-Leahy Scale: Fair Standing balance comment: balance  was effected by significant lightheadedness  on multiple standing attempts                             Pertinent Vitals/Pain Pain Assessment Pain Assessment: Faces Faces Pain Scale: Hurts a little bit Pain Location: headache, minimal actual pain    Home Living Family/patient expects to be discharged to:: Private residence Living Arrangements: Spouse/significant other Available Help at Discharge: Available 24 hours/day Type of Home: House Home Access: Stairs to  enter Entrance Stairs-Rails: None Entrance Stairs-Number of Steps: 3   Home Layout: One level        Prior Function Prior Level of Function : Independent/Modified Independent;Driving             Mobility Comments: report she walks 1-2 miles 3x/wk with a friend, drives and able to stay active ADLs Comments: independent     Extremity/Trunk Assessment   Upper Extremity Assessment Upper Extremity Assessment: Overall WFL for tasks assessed    Lower Extremity Assessment Lower Extremity Assessment: Overall WFL for tasks assessed       Communication   Communication Communication: No apparent difficulties    Cognition Arousal: Alert Behavior During Therapy: WFL for tasks assessed/performed   PT - Cognitive impairments: No apparent impairments                         Following commands: Intact       Cueing       General Comments General comments (skin integrity, edema, etc.): orthostatics during our session: supine 101/48, seated 80/66, standing 52/42, symptomatic during all transitions    Exercises     Assessment/Plan    PT Assessment Patient needs continued PT services  PT Problem List Decreased activity tolerance;Decreased balance;Decreased mobility;Decreased safety awareness;Cardiopulmonary status limiting activity       PT Treatment Interventions Gait training;Stair training;Functional mobility training;Therapeutic activities;Therapeutic exercise;Balance training;Neuromuscular re-education;Patient/family education    PT Goals (Current goals can be found in the Care Plan section)  Acute Rehab PT Goals Patient Stated Goal: get feeling better and go home PT Goal Formulation: With patient Time For Goal Achievement: 08/20/23 Potential to Achieve Goals: Good    Frequency Min 2X/week     Co-evaluation               AM-PAC PT "6 Clicks" Mobility  Outcome Measure Help needed turning from your back to your side while in a flat bed without  using bedrails?: None Help needed moving from lying on your back to sitting on the side of a flat bed without using bedrails?: None Help needed moving to and from a bed to a chair (including a wheelchair)?: A Little Help needed standing up from a chair using your arms (e.g., wheelchair or bedside chair)?: None Help needed to walk in hospital room?: A Little Help needed climbing 3-5 steps with a railing? : A Little 6 Click Score: 21    End of Session Equipment Utilized During Treatment: Gait belt Activity Tolerance: Other (comment) (limited due to low BP, variable HR (40s-90s) with minimal activity, pt generally not feeling well) Patient left: with bed alarm set;with call bell/phone within reach Nurse Communication: Mobility status (vitals) PT Visit Diagnosis: Muscle weakness (generalized) (M62.81);Difficulty in walking, not elsewhere classified (R26.2);Unsteadiness on feet (R26.81)    Time: 8295-6213 PT Time Calculation (min) (ACUTE ONLY): 26 min   Charges:   PT Evaluation $PT Eval Low Complexity: 1 Low  PT General Charges $$ ACUTE PT VISIT: 1 Visit         Darice Edelman, DPT 08/07/2023, 5:08 PM

## 2023-08-07 NOTE — Assessment & Plan Note (Addendum)
 08-07-2023 check serum and urine electrophoresis. Continue with IVF. Holding lasix .  08-08-2023 Acute kidney injury is improved. Serum creatinine down to 1.63. Calcium  down to 10.6.   Continue with IV fluids 1 more day.  08-09-2023 Scr 1.5 today.  Pt to remain off lasix , ACEI/ARB, NSAIDs. She will continue to push po liquids at home. Will need repeat BMP in PCP office in 1-2 weeks.

## 2023-08-07 NOTE — Progress Notes (Signed)
 PROGRESS NOTE    Katelyn Cooper  YNW:[id number] DOB: 03/23/1956 DOA: 08/06/2023 PCP: Katelyn Pillow, MD  Subjective: Patient seen and examined.  Met with patient and her husband Katelyn Cooper at bedside.  Patient does not know how long she has had polyuria for.  She has been on Lasix  every day for many years.  She does take a number of multivitamins daily.  Some may have calcium .  Some may have vitamin D.  She does take antacids on a daily basis.  Of asked the husband Katelyn Cooper to bring in all the pills and all the bottles to the hospital tomorrow so these can be checked to see if she has been ingesting too much calcium  or vitamin D.  She has no prior history of cancers.   Hospital Course: HPI: Katelyn Cooper is a 68 y.o. female with medical history significant of type 2 diabetes on mounjaro since 05/2023, depression, anxiety, CAD, CHF, COPD, OSA, history of gastric ulcer, who presents to the emergency department for evaluation of generalized weakness.  Patient reports that she just got back from vacation in [place]  on 5/1. She reports not long after getting back she was feeling fatigued but assumed it was due to travel. She began feeling achey all over. She went to bed Monday night and woke up vomiting. When she was changing the sheets she reports she fell back against the wall and hit her right elbow. She did not hit her head or lose consciousness. This morning she attempted a meal replacement drink and she began vomiting again so presented to urgent care, who urged her to come to ED instead.    At present she denies any nausea, chest pain, dyspnea. She denies abdominal pain or bloating sensation but notes that she has no appetite.   In the ED labs are notable for AKI, severe hypercalcemia >14, and CT with distended stomach. She is mildly tachycardic. She was give 500cc NS.  Significant Events: Admitted 08/06/2023 for hypercalcemia   Significant Labs: WBC 12.4, HgB 15.3, plt 221 Na 135, K 3.7, CO2 of 24,  BUN 20, Scr 1.91 Ca 14.2, TP 8.4, alb 4.2, AST 42, ALT 45, alk phos 85 Mg 1.9  Significant Imaging Studies: CT head Normal head CT. No abnormality seen to explain the presenting symptoms. CT abd/pelvis Wall thickening of the distal esophagus with surrounding inflammatory stranding and fluid compatible with esophagitis. 2. The stomach is distended with a large amount of debris. Correlate clinically. 3. Sigmoid colon diverticulosis. 4. Aortic atherosclerosis.  Antibiotic Therapy: Anti-infectives (From admission, onward)    None       Procedures:   Consultants:     Assessment and Plan: * Hypercalcemia 08-07-2023 unclear the cause of her hypercalcemia. Mildly improved with IVF. PTH pending. Will add vit D levels. Asked husband to bring in all of pt's pill bottles so it can be checked for vit D doses and calcium  doses. May need neoplastic workup of this doesn't show anything.  AKI (acute kidney injury) (HCC) 08-07-2023 check serum and urine electrophoresis. Continue with IVF. Holding lasix .  Essential hypertension 08-07-2023 stable. On lopressor .  Chronic heart failure with preserved ejection fraction (HFpEF) (HCC) 08-07-2023 on lopressor . Holding lasix  due to AKI. Repeat echo ordered. Last echo in 2023 showed LVEF 55%  Diabetes mellitus type 2, controlled (HCC) 08-07-2023 on SSI.  DVT prophylaxis: enoxaparin  (LOVENOX ) injection 30 mg Start: 08/06/23 2200    Code Status: Full Code Family Communication: discussed with pt and husband mark at  bedside Disposition Plan: return home Reason for continuing need for hospitalization: continue IVF. Monitoring serum calcium  and Scr.  Objective: Vitals:   08/07/23 0950 08/07/23 0955 08/07/23 1000 08/07/23 1005  BP:   (!) 97/55   Pulse: 86 81 86 86  Resp:      Temp:      TempSrc:      SpO2: 94% (!) 88% 96% 96%  Weight:      Height:       No intake or output data in the 24 hours ending 08/07/23 1014 Filed Weights   08/06/23  1140  Weight: 72.8 kg    Examination:  Physical Exam Vitals and nursing note reviewed.  Constitutional:      General: She is not in acute distress.    Appearance: She is not toxic-appearing or diaphoretic.  HENT:     Head: Normocephalic and atraumatic.     Nose: Nose normal.  Eyes:     General: No scleral icterus. Cardiovascular:     Rate and Rhythm: Normal rate and regular rhythm.  Pulmonary:     Effort: Pulmonary effort is normal.     Breath sounds: Normal breath sounds.  Abdominal:     General: Bowel sounds are normal. There is no distension.     Palpations: Abdomen is soft.     Tenderness: There is no abdominal tenderness.  Musculoskeletal:     Right lower leg: No edema.     Left lower leg: No edema.  Skin:    General: Skin is warm and dry.     Capillary Refill: Capillary refill takes less than 2 seconds.  Neurological:     Mental Status: She is alert and oriented to person, place, and time.     Data Reviewed: I have personally reviewed following labs and imaging studies  CBC: Recent Labs  Lab 08/06/23 1142 08/06/23 1918 08/07/23 0508  WBC 12.4* 10.1 7.1  NEUTROABS  --  7.5 4.5  HGB 15.3* 13.9 12.3  HCT 46.8* 42.9 37.9  MCV 90.0 91.5 92.7  PLT 221 169 142*   Basic Metabolic Panel: Recent Labs  Lab 08/06/23 1142 08/06/23 1918  NA 135 136  K 3.7 3.6  CL 94* 96*  CO2 24 28  GLUCOSE 185* 124*  BUN 20 21  CREATININE 1.91* 1.89*  CALCIUM  14.2* 13.3*  MG 1.9  --    GFR: Estimated Creatinine Clearance: 26.9 mL/min (A) (by C-G formula based on SCr of 1.89 mg/dL (H)). Liver Function Tests: Recent Labs  Lab 08/06/23 1142 08/06/23 1918  AST 42* 40  ALT 45* 40  ALKPHOS 85 79  BILITOT 0.9 0.8  PROT 8.4* 7.5  ALBUMIN 4.2 3.7   BNP (last 3 results) Recent Labs    02/25/23 1016  BNP 37.3   HbA1C: Recent Labs    08/06/23 1918  HGBA1C 6.6*   CBG: Recent Labs  Lab 08/06/23 2158 08/07/23 0716  GLUCAP 136* 116*   Radiology Studies: CT  ABDOMEN PELVIS WO CONTRAST Result Date: 08/06/2023 CLINICAL DATA:  Acute abdominal pain a EXAM: CT ABDOMEN AND PELVIS WITHOUT CONTRAST TECHNIQUE: Multidetector CT imaging of the abdomen and pelvis was performed following the standard protocol without IV contrast. RADIATION DOSE REDUCTION: This exam was performed according to the departmental dose-optimization program which includes automated exposure control, adjustment of the mA and/or kV according to patient size and/or use of iterative reconstruction technique. COMPARISON:  CT abdomen and pelvis 02/06/2010 FINDINGS: Lower chest: No acute abnormality. Hepatobiliary: No focal  liver abnormality is seen. No gallstones, gallbladder wall thickening, or biliary dilatation. Pancreas: Unremarkable. No pancreatic ductal dilatation or surrounding inflammatory changes. Spleen: Normal in size without focal abnormality. Adrenals/Urinary Tract: Adrenal glands are unremarkable. Kidneys are normal, without renal calculi, focal lesion, or hydronephrosis. Bladder is unremarkable. Stomach/Bowel: The stomach is distended with a large amount of debris. There is wall thickening of the distal esophagus with mild surrounding inflammatory stranding and fluid. No evidence of bowel wall thickening, distention, or inflammatory changes. There is sigmoid colon diverticulosis. The appendix is not visualized. Vascular/Lymphatic: Aortic atherosclerosis. No enlarged abdominal or pelvic lymph nodes. Reproductive: Uterus and bilateral adnexa are unremarkable. Other: No abdominal wall hernia or abnormality. No abdominopelvic ascites. Musculoskeletal: No acute or significant osseous findings. IMPRESSION: 1. Wall thickening of the distal esophagus with surrounding inflammatory stranding and fluid compatible with esophagitis. 2. The stomach is distended with a large amount of debris. Correlate clinically. 3. Sigmoid colon diverticulosis. 4. Aortic atherosclerosis. Aortic Atherosclerosis (ICD10-I70.0).  Electronically Signed   By: Tyron Gallon M.D.   On: 08/06/2023 16:24   CT HEAD WO CONTRAST ( ) Result Date: 08/06/2023 CLINICAL DATA:  Syncope/presyncope. Cerebrovascular cause suspected. Dizziness. EXAM: CT HEAD WITHOUT CONTRAST TECHNIQUE: Contiguous axial images were obtained from the base of the skull through the vertex without intravenous contrast. RADIATION DOSE REDUCTION: This exam was performed according to the departmental dose-optimization program which includes automated exposure control, adjustment of the mA and/or kV according to patient size and/or use of iterative reconstruction technique. COMPARISON:  MRI 10/18/2021 FINDINGS: Brain: No sign of acute or subacute stroke. No mass, hemorrhage, hydrocephalus or extra-axial collection. Minimal small vessel changes of the white matter shown by MRI are not appreciable by CT. Vascular: No abnormal vascular finding. Skull: Normal Sinuses/Orbits: Clear/normal Other: None IMPRESSION: Normal head CT. No abnormality seen to explain the presenting symptoms. Electronically Signed   By: Bettylou Brunner M.D.   On: 08/06/2023 12:09    Scheduled Meds:  aspirin  EC  81 mg Oral Daily   atorvastatin   80 mg Oral Daily   docusate sodium   100 mg Oral Daily   enoxaparin  (LOVENOX ) injection  30 mg Subcutaneous Q24H   insulin  aspart  0-9 Units Subcutaneous TID WC   metoprolol  tartrate  25 mg Oral BID   QUEtiapine   100 mg Oral QHS   Continuous Infusions:  sodium chloride  125 mL/hr at 08/06/23 1938     LOS: 0 days   Time spent: 50 minutes  Unk Garb, DO  [facility]  08/07/2023, 10:14 AM

## 2023-08-07 NOTE — ED Notes (Signed)
 This NT assisted pt to bedside commode and back into bed. Pt is comfortably resting in bed with no further needs at this time.

## 2023-08-07 NOTE — Hospital Course (Signed)
 HPI: Katelyn Cooper is a 68 y.o. female with medical history significant of type 2 diabetes on mounjaro since 05/2023, depression, anxiety, CAD, CHF, COPD, OSA, history of gastric ulcer, who presents to the emergency department for evaluation of generalized weakness.  Patient reports that she just got back from vacation in [place]  on 5/1. She reports not long after getting back she was feeling fatigued but assumed it was due to travel. She began feeling achey all over. She went to bed Monday night and woke up vomiting. When she was changing the sheets she reports she fell back against the wall and hit her right elbow. She did not hit her head or lose consciousness. This morning she attempted a meal replacement drink and she began vomiting again so presented to urgent care, who urged her to come to ED instead.    At present she denies any nausea, chest pain, dyspnea. She denies abdominal pain or bloating sensation but notes that she has no appetite.   In the ED labs are notable for AKI, severe hypercalcemia >14, and CT with distended stomach. She is mildly tachycardic. She was give 500cc NS.  Significant Events: Admitted 08/06/2023 for hypercalcemia   Significant Labs: WBC 12.4, HgB 15.3, plt 221 Na 135, K 3.7, CO2 of 24, BUN 20, Scr 1.91 Ca 14.2, TP 8.4, alb 4.2, AST 42, ALT 45, alk phos 85 Mg 1.9  Significant Imaging Studies: CT head Normal head CT. No abnormality seen to explain the presenting symptoms. CT abd/pelvis Wall thickening of the distal esophagus with surrounding inflammatory stranding and fluid compatible with esophagitis. 2. The stomach is distended with a large amount of debris. Correlate clinically. 3. Sigmoid colon diverticulosis. 4. Aortic atherosclerosis.  Antibiotic Therapy: Anti-infectives (From admission, onward)    None       Procedures:   Consultants:

## 2023-08-07 NOTE — ED Notes (Signed)
 Pt provided with [place] @ 2 lpm while sleeping. Pt states that she normally uses CPAP at home but did not have with her tonight.

## 2023-08-07 NOTE — ED Notes (Signed)
 Critical lab 13.3- Calcium .

## 2023-08-07 NOTE — Subjective & Objective (Addendum)
 Patient seen and examined.  Stable. Calcium  10.5. pt wants to go home. I have asked to stop all vitamins and supplements until she is seen by PCP.

## 2023-08-07 NOTE — ED Notes (Signed)
 Pt ABCs intact. RR even and unlabored. Pt in NAD. Bed in lowest locked position. Call bell in reach. Denies needs at this time.

## 2023-08-08 DIAGNOSIS — E119 Type 2 diabetes mellitus without complications: Secondary | ICD-10-CM | POA: Diagnosis not present

## 2023-08-08 DIAGNOSIS — N179 Acute kidney failure, unspecified: Secondary | ICD-10-CM | POA: Diagnosis not present

## 2023-08-08 DIAGNOSIS — I5032 Chronic diastolic (congestive) heart failure: Secondary | ICD-10-CM | POA: Diagnosis not present

## 2023-08-08 LAB — CBC WITH DIFFERENTIAL/PLATELET
Abs Immature Granulocytes: 0.02 10*3/uL (ref 0.00–0.07)
Basophils Absolute: 0 10*3/uL (ref 0.0–0.1)
Basophils Relative: 1 %
Eosinophils Absolute: 0.2 10*3/uL (ref 0.0–0.5)
Eosinophils Relative: 4 %
HCT: 38.5 % (ref 36.0–46.0)
Hemoglobin: 12.1 g/dL (ref 12.0–15.0)
Immature Granulocytes: 0 %
Lymphocytes Relative: 29 %
Lymphs Abs: 1.5 10*3/uL (ref 0.7–4.0)
MCH: 29.7 pg (ref 26.0–34.0)
MCHC: 31.4 g/dL (ref 30.0–36.0)
MCV: 94.4 fL (ref 80.0–100.0)
Monocytes Absolute: 0.3 10*3/uL (ref 0.1–1.0)
Monocytes Relative: 6 %
Neutro Abs: 3.3 10*3/uL (ref 1.7–7.7)
Neutrophils Relative %: 60 %
Platelets: 121 10*3/uL — ABNORMAL LOW (ref 150–400)
RBC: 4.08 MIL/uL (ref 3.87–5.11)
RDW: 15.2 % (ref 11.5–15.5)
WBC: 5.4 10*3/uL (ref 4.0–10.5)
nRBC: 0 % (ref 0.0–0.2)

## 2023-08-08 LAB — COMPREHENSIVE METABOLIC PANEL WITH GFR
ALT: 33 U/L (ref 0–44)
AST: 33 U/L (ref 15–41)
Albumin: 3.4 g/dL — ABNORMAL LOW (ref 3.5–5.0)
Alkaline Phosphatase: 70 U/L (ref 38–126)
Anion gap: 9 (ref 5–15)
BUN: 18 mg/dL (ref 8–23)
CO2: 25 mmol/L (ref 22–32)
Calcium: 10.6 mg/dL — ABNORMAL HIGH (ref 8.9–10.3)
Chloride: 107 mmol/L (ref 98–111)
Creatinine, Ser: 1.63 mg/dL — ABNORMAL HIGH (ref 0.44–1.00)
GFR, Estimated: 34 mL/min — ABNORMAL LOW (ref >60)
Glucose, Bld: 121 mg/dL — ABNORMAL HIGH (ref 70–99)
Potassium: 3 mmol/L — ABNORMAL LOW (ref 3.5–5.1)
Sodium: 141 mmol/L (ref 135–145)
Total Bilirubin: 0.6 mg/dL (ref 0.0–1.2)
Total Protein: 7 g/dL (ref 6.5–8.1)

## 2023-08-08 LAB — ECHOCARDIOGRAM COMPLETE
Area-P 1/2: 4.68 cm2
Height: 63 in
S' Lateral: 3 cm
Weight: 2567.92 [oz_av]

## 2023-08-08 LAB — PROTEIN ELECTROPHORESIS, SERUM
A/G Ratio: 0.9 (ref 0.7–1.7)
Albumin ELP: 3 g/dL (ref 2.9–4.4)
Alpha-1-Globulin: 0.2 g/dL (ref 0.0–0.4)
Alpha-2-Globulin: 1 g/dL (ref 0.4–1.0)
Beta Globulin: 1.2 g/dL (ref 0.7–1.3)
Gamma Globulin: 0.8 g/dL (ref 0.4–1.8)
Globulin, Total: 3.2 g/dL (ref 2.2–3.9)
Total Protein ELP: 6.2 g/dL (ref 6.0–8.5)

## 2023-08-08 LAB — CBG MONITORING, ED
Glucose-Capillary: 124 mg/dL — ABNORMAL HIGH (ref 70–99)
Glucose-Capillary: 136 mg/dL — ABNORMAL HIGH (ref 70–99)

## 2023-08-08 LAB — PARATHYROID HORMONE, INTACT (NO CA): PTH: 9 pg/mL — ABNORMAL LOW (ref 15–65)

## 2023-08-08 LAB — MAGNESIUM: Magnesium: 1.8 mg/dL (ref 1.7–2.4)

## 2023-08-08 LAB — GLUCOSE, CAPILLARY
Glucose-Capillary: 100 mg/dL — ABNORMAL HIGH (ref 70–99)
Glucose-Capillary: 94 mg/dL (ref 70–99)

## 2023-08-08 LAB — CALCIUM, IONIZED: Calcium, Ionized, Serum: 7.2 mg/dL — ABNORMAL HIGH (ref 4.5–5.6)

## 2023-08-08 LAB — PHOSPHORUS: Phosphorus: 3.1 mg/dL (ref 2.5–4.6)

## 2023-08-08 LAB — VITAMIN D 25 HYDROXY (VIT D DEFICIENCY, FRACTURES): Vit D, 25-Hydroxy: 64.59 ng/mL (ref 30–100)

## 2023-08-08 MED ORDER — ENOXAPARIN SODIUM 40 MG/0.4ML IJ SOSY
40.0000 mg | PREFILLED_SYRINGE | INTRAMUSCULAR | Status: DC
  Administered 2023-08-08: 40 mg via SUBCUTANEOUS
  Filled 2023-08-08: qty 0.4

## 2023-08-08 MED ORDER — PANTOPRAZOLE SODIUM 40 MG PO TBEC
40.0000 mg | DELAYED_RELEASE_TABLET | Freq: Two times a day (BID) | ORAL | Status: DC
  Administered 2023-08-08 – 2023-08-09 (×3): 40 mg via ORAL
  Filled 2023-08-08 (×3): qty 1

## 2023-08-08 MED ORDER — SODIUM CHLORIDE 0.9 % IV SOLN: INTRAVENOUS | Status: AC

## 2023-08-08 MED ORDER — POTASSIUM CHLORIDE CRYS ER 20 MEQ PO TBCR
40.0000 meq | EXTENDED_RELEASE_TABLET | ORAL | Status: AC
  Administered 2023-08-08 (×2): 40 meq via ORAL
  Filled 2023-08-08 (×2): qty 2

## 2023-08-08 NOTE — Progress Notes (Signed)
 PROGRESS NOTE    Katelyn Cooper  CWC:[id number] DOB: [birth year] DOA: 08/06/2023 PCP: Lamon Pillow, MD  Subjective: Patient seen and examined.  Met with patient and her husband Katelyn Cooper at bedside.   Husband was coming up to bring in all the patient medications.  Patient is on multiple OTC vitamins.  She was taking both 5000 international units of vitamin D3 along with another 1000 units of vitamin D3 in 2 separate pills.  She was also given another 2000 international units of vitamin D with a multivitamin.  She is also taking bone health supplement of calcium  and another 1000 units of vitamin D.  Patient also taking a protein shake supplement that has additional vitamin D.  Patient also taking multiple OTC medications that contain calcium .  She is also taking generic form of Tums.  I think this is a case of vitamin D toxicity along with overuse of calcium  carbonate.  Still waiting on vitamin D levels.  Acute kidney injury is improved.  Serum creatinine down to 1.63.  Calcium  down to 10.6.   Hospital Course: HPI: Katelyn Cooper is a 68 y.o. female with medical history significant of type 2 diabetes on mounjaro since 05/2023, depression, anxiety, CAD, CHF, COPD, OSA, history of gastric ulcer, who presents to the emergency department for evaluation of generalized weakness.  Patient reports that she just got back from vacation in [place]  on 5/1. She reports not long after getting back she was feeling fatigued but assumed it was due to travel. She began feeling achey all over. She went to bed Monday night and woke up vomiting. When she was changing the sheets she reports she fell back against the wall and hit her right elbow. She did not hit her head or lose consciousness. This morning she attempted a meal replacement drink and she began vomiting again so presented to urgent care, who urged her to come to ED instead.    At present she denies any nausea, chest pain, dyspnea. She denies abdominal pain or  bloating sensation but notes that she has no appetite.   In the ED labs are notable for AKI, severe hypercalcemia >14, and CT with distended stomach. She is mildly tachycardic. She was give 500cc NS.  Significant Events: Admitted 08/06/2023 for hypercalcemia   Significant Labs: WBC 12.4, HgB 15.3, plt 221 Na 135, K 3.7, CO2 of 24, BUN 20, Scr 1.91 Ca 14.2, TP 8.4, alb 4.2, AST 42, ALT 45, alk phos 85 Mg 1.9  Significant Imaging Studies: CT head Normal head CT. No abnormality seen to explain the presenting symptoms. CT abd/pelvis Wall thickening of the distal esophagus with surrounding inflammatory stranding and fluid compatible with esophagitis. 2. The stomach is distended with a large amount of debris. Correlate clinically. 3. Sigmoid colon diverticulosis. 4. Aortic atherosclerosis.  Antibiotic Therapy: Anti-infectives (From admission, onward)    None       Procedures:   Consultants:     Assessment and Plan: * Hypercalcemia 08-07-2023 unclear the cause of her hypercalcemia. Mildly improved with IVF. PTH pending. Will add vit D levels. Asked husband to bring in all of pt's pill bottles so it can be checked for vit D doses and calcium  doses. May need neoplastic workup of this doesn't show anything.  08-08-2023 likely due to vitamin D toxicity along with overuse of calcium  carbonate.  Awaiting vitamin D labs.  AKI (acute kidney injury) (HCC) 08-07-2023 check serum and urine electrophoresis. Continue with IVF. Holding lasix .  08-08-2023 Acute  kidney injury is improved. Serum creatinine down to 1.63. Calcium  down to 10.6.   Continue with IV fluids 1 more day.  Essential hypertension 08-07-2023 stable. On lopressor .  Chronic heart failure with preserved ejection fraction (HFpEF) (HCC) 08-07-2023 on lopressor . Holding lasix  due to AKI. Repeat echo ordered. Last echo in 2023 showed LVEF 55%  08-08-2023 lopressor  on hold due to orthostatic hypotension yesterday.  Diabetes  mellitus type 2, controlled (HCC) 08-07-2023 on SSI.  08-08-2023 stable   DVT prophylaxis: enoxaparin  (LOVENOX ) injection 40 mg Start: 08/08/23 2200 Place TED hose Start: 08/07/23 1713    Code Status: Full Code Family Communication: discussed with pt and husband mark at bedside Disposition Plan: return home Reason for continuing need for hospitalization: remains on IVF, checking calcium  levels.  Objective: Vitals:   08/08/23 0100 08/08/23 0345 08/08/23 0515 08/08/23 0600  BP: (!) 113/57 126/64 112/72 (!) 143/71  Pulse: 96 100 (!) 102 (!) 110  Resp:      Temp:      TempSrc:      SpO2: 94% 99% 100% 100%  Weight:      Height:       No intake or output data in the 24 hours ending 08/08/23 1032 Filed Weights   08/06/23 1140  Weight: 72.8 kg    Examination:  Physical Exam Vitals and nursing note reviewed.  Constitutional:      General: She is not in acute distress.    Appearance: She is not toxic-appearing or diaphoretic.  HENT:     Head: Normocephalic and atraumatic.     Nose: Nose normal.  Eyes:     General: No scleral icterus. Cardiovascular:     Rate and Rhythm: Normal rate and regular rhythm.  Pulmonary:     Effort: Pulmonary effort is normal.     Breath sounds: Normal breath sounds.  Abdominal:     General: Bowel sounds are normal. There is no distension.     Palpations: Abdomen is soft.     Tenderness: There is no abdominal tenderness.  Musculoskeletal:     Right lower leg: No edema.     Left lower leg: No edema.  Skin:    General: Skin is warm and dry.     Capillary Refill: Capillary refill takes less than 2 seconds.  Neurological:     Mental Status: She is alert and oriented to person, place, and time.     Data Reviewed: I have personally reviewed following labs and imaging studies  CBC: Recent Labs  Lab 08/06/23 1142 08/06/23 1918 08/07/23 0508 08/08/23 0509  WBC 12.4* 10.1 7.1 5.4  NEUTROABS  --  7.5 4.5 3.3  HGB 15.3* 13.9 12.3 12.1   HCT 46.8* 42.9 37.9 38.5  MCV 90.0 91.5 92.7 94.4  PLT 221 169 142* 121*   Basic Metabolic Panel: Recent Labs  Lab 08/06/23 1142 08/06/23 1918 08/08/23 0509  NA 135 136 141  K 3.7 3.6 3.0*  CL 94* 96* 107  CO2 24 28 25   GLUCOSE 185* 124* 121*  BUN 20 21 18   CREATININE 1.91* 1.89* 1.63*  CALCIUM  14.2* 13.3* 10.6*  MG 1.9  --  1.8  PHOS  --   --  3.1   GFR: Estimated Creatinine Clearance: 31.2 mL/min (A) (by C-G formula based on SCr of 1.63 mg/dL (H)). Liver Function Tests: Recent Labs  Lab 08/06/23 1142 08/06/23 1918 08/08/23 0509  AST 42* 40 33  ALT 45* 40 33  ALKPHOS 85 79 70  BILITOT  0.9 0.8 0.6  PROT 8.4* 7.5 7.0  ALBUMIN 4.2 3.7 3.4*   BNP (last 3 results) Recent Labs    02/25/23 1016  BNP 37.3   HbA1C: Recent Labs    08/06/23 1918  HGBA1C 6.6*   CBG: Recent Labs  Lab 08/07/23 0716 08/07/23 1143 08/07/23 1636 08/07/23 2231 08/08/23 0837  GLUCAP 116* 130* 100* 111* 124*   Radiology Studies: CT ABDOMEN PELVIS WO CONTRAST Result Date: 08/06/2023 CLINICAL DATA:  Acute abdominal pain a EXAM: CT ABDOMEN AND PELVIS WITHOUT CONTRAST TECHNIQUE: Multidetector CT imaging of the abdomen and pelvis was performed following the standard protocol without IV contrast. RADIATION DOSE REDUCTION: This exam was performed according to the departmental dose-optimization program which includes automated exposure control, adjustment of the mA and/or kV according to patient size and/or use of iterative reconstruction technique. COMPARISON:  CT abdomen and pelvis 02/06/2010 FINDINGS: Lower chest: No acute abnormality. Hepatobiliary: No focal liver abnormality is seen. No gallstones, gallbladder wall thickening, or biliary dilatation. Pancreas: Unremarkable. No pancreatic ductal dilatation or surrounding inflammatory changes. Spleen: Normal in size without focal abnormality. Adrenals/Urinary Tract: Adrenal glands are unremarkable. Kidneys are normal, without renal calculi, focal  lesion, or hydronephrosis. Bladder is unremarkable. Stomach/Bowel: The stomach is distended with a large amount of debris. There is wall thickening of the distal esophagus with mild surrounding inflammatory stranding and fluid. No evidence of bowel wall thickening, distention, or inflammatory changes. There is sigmoid colon diverticulosis. The appendix is not visualized. Vascular/Lymphatic: Aortic atherosclerosis. No enlarged abdominal or pelvic lymph nodes. Reproductive: Uterus and bilateral adnexa are unremarkable. Other: No abdominal wall hernia or abnormality. No abdominopelvic ascites. Musculoskeletal: No acute or significant osseous findings. IMPRESSION: 1. Wall thickening of the distal esophagus with surrounding inflammatory stranding and fluid compatible with esophagitis. 2. The stomach is distended with a large amount of debris. Correlate clinically. 3. Sigmoid colon diverticulosis. 4. Aortic atherosclerosis. Aortic Atherosclerosis (ICD10-I70.0). Electronically Signed   By: Tyron Gallon M.D.   On: 08/06/2023 16:24   CT HEAD WO CONTRAST ( ) Result Date: 08/06/2023 CLINICAL DATA:  Syncope/presyncope. Cerebrovascular cause suspected. Dizziness. EXAM: CT HEAD WITHOUT CONTRAST TECHNIQUE: Contiguous axial images were obtained from the base of the skull through the vertex without intravenous contrast. RADIATION DOSE REDUCTION: This exam was performed according to the departmental dose-optimization program which includes automated exposure control, adjustment of the mA and/or kV according to patient size and/or use of iterative reconstruction technique. COMPARISON:  MRI 10/18/2021 FINDINGS: Brain: No sign of acute or subacute stroke. No mass, hemorrhage, hydrocephalus or extra-axial collection. Minimal small vessel changes of the white matter shown by MRI are not appreciable by CT. Vascular: No abnormal vascular finding. Skull: Normal Sinuses/Orbits: Clear/normal Other: None IMPRESSION: Normal head CT. No  abnormality seen to explain the presenting symptoms. Electronically Signed   By: Bettylou Brunner M.D.   On: 08/06/2023 12:09    Scheduled Meds:  aspirin  EC  81 mg Oral Daily   atorvastatin   80 mg Oral Daily   docusate sodium   100 mg Oral Daily   enoxaparin  (LOVENOX ) injection  40 mg Subcutaneous Q24H   insulin  aspart  0-9 Units Subcutaneous TID WC   potassium chloride   40 mEq Oral Q4H   QUEtiapine   100 mg Oral QHS   Continuous Infusions:  sodium chloride  100 mL/hr at 08/08/23 1029     LOS: 1 day   Time spent: 45 minutes  Unk Garb, DO  [facility]  08/08/2023, 10:32 AM

## 2023-08-08 NOTE — Progress Notes (Signed)
 Occupational Therapy Evaluation Patient Details Name: Katelyn Cooper MRN: [id number] DOB: 1955-05-21 Today's Date: 08/08/2023   History of Present Illness   68 y.o. female with medical history significant of type 2 diabetes on mounjaro since 05/2023, depression, anxiety, CAD, CHF, COPD, OSA, history of gastric ulcer, who presents to the emergency department for evaluation of generalized weakness.  Patient reports that she just got back from vacation in [place]  on 5/1. She reports not long after getting back she was feeling fatigued but assumed it was due to travel. She began feeling achey all over. She went to bed Monday night and woke up vomiting. When she was changing the sheets she reports she fell back against the wall and hit her right elbow.     Clinical Impressions Pt was seen for OT evaluation this date. Prior to hospital admission, pt was indep in all ADL/IADLs. Pt lives with her husband who is able to willing to assist her as needed. Pt presents to acute OT demonstrating impaired ADL performance and functional mobility due to dizziness when standing (See OT problem list for additional functional deficits). Indep with donning/doffing bilateral socks using figure 4 method. Pt currently requires CGA-HHA during amb due to dizziness with position changes. Pt completed orthostatic vitals during the session, with mild symptoms, pt eager to amb within the room. Initial CGA digressed to HHA to aide in dizzy spells. Visitor in room at the end of session, pt retired in bed with all needs in reach. Pt would benefit from skilled OT services to address noted impairments and functional limitations (see below for any additional details) in order to maximize safety and independence while minimizing falls risk and caregiver burden. OT will follow acutely.  Orthostatic vitals: Sitting BP: 126/49 (MAP72) , Standing BP: 95/71(MAP79), 3-min standing 113/92 (MAP100)     If plan is discharge home, recommend the  following:   A little help with walking and/or transfers;Assist for transportation     Functional Status Assessment   Patient has had a recent decline in their functional status and demonstrates the ability to make significant improvements in function in a reasonable and predictable amount of time.     Equipment Recommendations   None recommended by OT     Recommendations for Other Services         Precautions/Restrictions   Precautions Precautions: Fall Restrictions Weight Bearing Restrictions Per Provider Order: No     Mobility Bed Mobility Overal bed mobility: Modified Independent                  Transfers Overall transfer level: Modified independent Equipment used: None               General transfer comment: Tolerated orthostatic vitals in standing, wanted to walk around room HHA due to dizziness ~106ft      Balance Overall balance assessment: Needs assistance Sitting-balance support: No upper extremity supported Sitting balance-Leahy Scale: Normal     Standing balance support: No upper extremity supported, Single extremity supported Standing balance-Leahy Scale: Fair Standing balance comment: pt standing balance affected by dizziness                           ADL either performed or assessed with clinical judgement   ADL Overall ADL's : Needs assistance/impaired Eating/Feeding: Independent                   Lower Body Dressing: Independent (Sitting on EOB)  Toilet Transfer: Contact guard assist;Ambulation (Simulated toilet t/f)           Functional mobility during ADLs: Contact guard assist General ADL Comments: CGA amb due to dizziness, indep don/doff bilateral socks     Vision         Perception         Praxis         Pertinent Vitals/Pain Pain Assessment Pain Assessment: No/denies pain     Extremity/Trunk Assessment Upper Extremity Assessment Upper Extremity Assessment: Overall WFL for  tasks assessed   Lower Extremity Assessment Lower Extremity Assessment: Generalized weakness;Defer to PT evaluation   Cervical / Trunk Assessment Cervical / Trunk Assessment: Normal   Communication Communication Communication: No apparent difficulties   Cognition Arousal: Alert Behavior During Therapy: WFL for tasks assessed/performed Cognition: No apparent impairments             OT - Cognition Comments: A/Ox4                 Following commands: Intact       Cueing  General Comments   Cueing Techniques: Verbal cues  Completed orthostatic vitals during session; Sitting BP: 126/49 (MAP72) , Standing BP: 95/71(MAP79), 3-min standing 113/92 (MAP100)   Exercises Exercises: Other exercises Other Exercises Other Exercises: Edu: Role of OT, safety transfers with onset of dizziness   Shoulder Instructions      Home Living Family/patient expects to be discharged to:: Private residence Living Arrangements: Spouse/significant other Available Help at Discharge: Available 24 hours/day Type of Home: House Home Access: Stairs to enter Secretary/administrator of Steps: 3 Entrance Stairs-Rails: None Home Layout: One level     Bathroom Shower/Tub: Chief Strategy Officer: Standard Bathroom Accessibility: Yes How Accessible: Accessible via walker Home Equipment: Rolling Walker (2 wheels)          Prior Functioning/Environment Prior Level of Function : Independent/Modified Independent;Driving             Mobility Comments: report she walks 1-2 miles 3x/wk with a friend, drives and able to stay active ADLs Comments: independent    OT Problem List: Decreased strength;Decreased activity tolerance;Impaired balance (sitting and/or standing);Decreased safety awareness;Decreased knowledge of precautions   OT Treatment/Interventions: Self-care/ADL training;Therapeutic exercise;Energy conservation;DME and/or AE instruction;Therapeutic activities;Cognitive  remediation/compensation;Patient/family education;Balance training      OT Goals(Current goals can be found in the care plan section)   Acute Rehab OT Goals Patient Stated Goal: to feel better Time For Goal Achievement: 08/22/23 Potential to Achieve Goals: Good   OT Frequency:  Min 1X/week    Co-evaluation              AM-PAC OT "6 Clicks" Daily Activity     Outcome Measure Help from another person eating meals?: None Help from another person taking care of personal grooming?: None Help from another person toileting, which includes using toliet, bedpan, or urinal?: A Little Help from another person bathing (including washing, rinsing, drying)?: A Little Help from another person to put on and taking off regular upper body clothing?: None Help from another person to put on and taking off regular lower body clothing?: None 6 Click Score: 22   End of Session Equipment Utilized During Treatment: Gait belt Nurse Communication: Mobility status  Activity Tolerance: Patient tolerated treatment well Patient left: in bed;with call bell/phone within reach;with bed alarm set;with family/visitor present  OT Visit Diagnosis: Unsteadiness on feet (R26.81);Muscle weakness (generalized) (M62.81);Other abnormalities of gait and mobility (R26.89);Dizziness and giddiness (R42)  Time: 1200-1223 OT Time Calculation (min): 23 min Charges:  OT General Charges $OT Visit: 1 Visit OT Evaluation $OT Eval Moderate Complexity: 1 Mod OT Treatments $Self Care/Home Management : 8-22 mins  Rosaria Common M.S. OTR/L  08/08/23, 1:33 PM

## 2023-08-08 NOTE — Evaluation (Addendum)
 Clinical/Bedside Swallow Evaluation Patient Details  Name: Katelyn Cooper MRN: [id number] Date of Birth: [birth year]  Today's Date: 08/08/2023 Time: SLP Start Time (ACUTE ONLY): 1025 SLP Stop Time (ACUTE ONLY): 1130 SLP Time Calculation (min) (ACUTE ONLY): 65 min  Past Medical History:  Past Medical History:  Diagnosis Date   (HFpEF) heart failure with preserved ejection fraction (HCC)    a. 12/2015 Echo: EF 55-60%, nl RV size/fxn, no significant valvular abnormalities; b. 10/2017 Echo: EF 50-55%, antsept, ant HK. Gr2 DD. Mild MR. Nl RV fxn. PASP .   Actinic keratosis    Anxiety    Basal cell carcinoma 08/08/2017   L nose above mid nasal alar groove   CHF (congestive heart failure) (HCC)    Depression    Diabetes type 2, controlled (HCC)    diet-controlled   Esophageal stricture    GERD (gastroesophageal reflux disease)    Hiatal hernia    History of chicken pox    Hyperlipidemia    Hypertension    Internal hemorrhoids    Non-obstructive CAD (coronary artery disease)    a. 12/2015 MV: apical ant and apical reversible defect. EF 55-65%; b. 01/2016 Cath: LM nl, LAD nl, SP1 40ost, LCX nl, OM1 30, RCA 30p/m, EDP 15-37mmHg; c. 10/2017 Cath: LM nl, LAD nl, SP1 30ost, LCX nl, OM1 30, RCA 30p/m. EF 55%.   Obstructive sleep apnea    PAD (peripheral artery disease) (HCC)    a. 1990's s/p prior LCIA stenting; b. 12/2015 ABI: R 1.05, L 1.03.   Panic disorder    Past Surgical History:  Past Surgical History:  Procedure Laterality Date   AORTA SURGERY  1999   stent placement; ? left iliac artery intervention   APPENDECTOMY  1998   BASAL CELL CARCINOMA EXCISION     C5 - fusion N/A 10/10/2021   CARDIAC CATHETERIZATION N/A 01/12/2016   Procedure: Left Heart Cath and Coronary Angiography;  Surgeon: Sammy Crisp, MD;  Location: [facility] INVASIVE CV LAB;  Service: Cardiovascular;  Laterality: N/A;   COLONOSCOPY  07/17/2019   FOOT SURGERY     Right   pap smear  03/2010   Done by Dr. Renea Carrion, normal   REFRACTIVE SURGERY     right   RIB RESECTION  [id number]   RIGHT/LEFT HEART CATH AND CORONARY ANGIOGRAPHY N/A 11/08/2017   Procedure: RIGHT/LEFT HEART CATH AND CORONARY ANGIOGRAPHY;  Surgeon: Wenona Hamilton, MD;  Location: [facility] INVASIVE CV LAB;  Service: Cardiovascular;  Laterality: N/A;   SHOULDER SURGERY  2005   left   UPPER GASTROINTESTINAL ENDOSCOPY  07/17/2019   UPPER GI ENDOSCOPY  09/19/2011   Stricture at GE junction, dilated. Mild gastritis and duodenitis. Small hiatal hernia   HPI:  Pt is a 68 y.o. female with medical history significant of type 2 diabetes on Mounjaro since 05/2023, depression, anxiety, CAD, CHF, COPD, OSA, history of Gastric ulcer and Hiatal Hernia and REFLUX/Esophageal phase Dysmotility w/ Dilation, who presents to the emergency department for evaluation of generalized weakness.   Patient reports that she just got back from wedding in [place]  on 5/1.  She reports not long after getting back she was feeling "more" fatigued and her leg "hurt" but assumed it was due to travel.  She endorsed "stress" of the event/travel.  She went to bed Monday night and woke up vomiting.  Pt endorses Baseline REFLUX activity but is not on a PPI.  Pt had EGDs in 2021, 2018, 2013 per chart.   CT of  Abd this admit: Lower chest: No acute abnormality. Wall thickening of the distal esophagus with surrounding  inflammatory stranding and fluid compatible with esophagitis.  2. The stomach is distended with a large amount of debris.    Assessment / Plan / Recommendation  Clinical Impression   Pt seen for BSE today. Pt awake, verbal and particiated appropriately during session. A/O x4; followed instructions. Husband present. She engaged easily and eager to learn any information to "get me better". OF NOTE: Pt was recently traveling and at a wedding w/ health concerns = increased stress.  On RA, afebrile, WBC not elevated.    OF NOTE: Pt does strongly endorse s/s of REFLUX at home;  unsure of her Medication regimen to manage her REFLUX s/s. She reports episodes of Esophageal phase Dysmotility and recent waking up to "vomiting". CT of Abd this admit: Lower chest: No acute abnormality. Wall thickening of the distal esophagus with surrounding  inflammatory stranding and fluid compatible with esophagitis.  2. The stomach is distended with a large amount of debris.    Pt appears to present w/ quite functional oropharyngeal phase swallowing w/ No overt, oropharyngeal phase dysphagia appreciated during oral intake of trials. No neuromuscular swallowing deficits appreciated. Pt appears at reduced risk for aspiration from an oropharyngeal phase standpoint following general aspiration precautions. HOWEVER, pt has a baseline presentation of REFLUX and Esophageal phase Dysmotility as well as episodes of REFLUX behavior at home. Noted the CT scan results this admit.  ANY Esophageal phase Dysmotility or Regurgitation of Reflux material can increase risk for aspiration of the Reflux material during Retrograde flow thus impact Voicing and Pulmonary status. Pt described issues of globus, dry throat clearing, and fullness. She has had Dilation in the past per her report.    Pt sat EOB and consumed several trials of thin liquids Via Cup, purees, and soft solid foods w/ No overt clinical s/s of aspiration noted; clear vocal quality b/t trials, no decline in pulmonary status, no cough, no decline in O2 sats(99%). Oral phase appeared [facility] for bolus management, mastication, and timely A-P transfer/clearing of material. Mastication appropriate for boluses. OM exam was [facility] for oral clearing; lingual/labial movements. No unilateral weakness. Speech clear. Pt fed self.   Recommend continue a Regular diet (moistened foods) w/ thin liquids w/ well-cut and moistened meats/solids. Avoid problematic foods. General aspiration precautions. Rest Breaks during meals/oral intake to allow for Esophageal clearing. REFLUX  precautions strongly recommended to lessen chance for Regurgitation -- HOB elevated at night when sleeping. Do not lie down post meals.    Recommend pt f/u w/ GI for assessment/management of REFLUX and tx as indicated -- pt stated she is looking for a new GI MD. Discussion and Handouts given on REFLUX, impact of REFLUX on swallowing and breathing, behaviors to manage REFLUX, and foods/diet. No further skilled ST services indicated. MD to reconsult ST services if any New needs while admitted. NSG updated. Pt and Husband appreciative of Education information. SLP Visit Diagnosis: Dysphagia, unspecified (R13.10) (Esophageal phase Dysmotility -- Baseline Hiatal Hernia; current Esophagitis presention per Imaging)    Aspiration Risk   (reduced from an oropharyngeal phase standpoint)    Diet Recommendation   Thin;Age appropriate regular (chopped meats, moistened well) = well-cut and moistened meats/solids. Avoid problematic foods. General aspiration precautions. Rest Breaks during meals/oral intake to allow for Esophageal clearing. REFLUX precautions strongly recommended to lessen chance for Regurgitation -- HOB elevated at night when sleeping. Do not lie down post meals.  Medication Administration: Whole meds with puree    Other  Recommendations Recommended Consults: Consider GI evaluation;Consider esophageal assessment (Dietician support) Oral Care Recommendations: Oral care BID;Patient independent with oral care    Recommendations for follow up therapy are one component of a multi-disciplinary discharge planning process, led by the attending physician.  Recommendations may be updated based on patient status, additional functional criteria and insurance authorization.  Follow up Recommendations No SLP follow up      Assistance Recommended at Discharge  PRN  Functional Status Assessment Patient has not had a recent decline in their functional status (in oropharyngeal phase swallowing -- Esophageal  phase dysmotility Baseline)  Frequency and Duration  (n/a)   (n/a)       Prognosis Prognosis for improved oropharyngeal function: Good Barriers to Reach Goals: Time post onset;Severity of deficits Barriers/Prognosis Comment: Esophageal phase Dysmotility -- Baseline Hiatal Hernia; current Esophagitis presention per Imaging      Swallow Study   General Date of Onset: 08/06/23 HPI: Pt is a 68 y.o. female with medical history significant of type 2 diabetes on Mounjaro since 05/2023, depression, anxiety, CAD, CHF, COPD, OSA, history of Gastric ulcer and Hiatal Hernia and REFLUX/Esophageal phase Dysmotility w/ Dilation, who presents to the emergency department for evaluation of generalized weakness.   Patient reports that she just got back from wedding in [place]  on 5/1.  She reports not long after getting back she was feeling "more" fatigued and her leg "hurt" but assumed it was due to travel.  She endorsed "stress" of the event/travel.  She went to bed Monday night and woke up vomiting.  Pt endorses Baseline REFLUX activity but is not on a PPI.  Pt had EGDs in 2021, 2018, 2013 per chart.   CT of Abd this admit: Lower chest: No acute abnormality. Wall thickening of the distal esophagus with surrounding  inflammatory stranding and fluid compatible with esophagitis.  2. The stomach is distended with a large amount of debris. Type of Study: Bedside Swallow Evaluation Previous Swallow Assessment: none Diet Prior to this Study: NPO (s/p coughing this morning w/ po Pill/water; has been on a regular diet w/ thins) Temperature Spikes Noted: No (WBC WNL) Respiratory Status: Room air History of Recent Intubation: No Behavior/Cognition: Alert;Cooperative;Pleasant mood Oral Cavity Assessment: Within Functional Limits Oral Care Completed by SLP: Recent completion by staff Oral Cavity - Dentition: Adequate natural dentition Vision: Functional for self-feeding Self-Feeding Abilities: Able to feed self;Needs set  up Patient Positioning: Upright in bed (EOB) Baseline Vocal Quality: Normal Volitional Cough: Strong    Oral/Motor/Sensory Function Overall Oral Motor/Sensory Function: Within functional limits   Ice Chips Ice chips: Not tested   Thin Liquid Thin Liquid: Within functional limits Presentation: Cup;Self Fed (10+ trials)    Nectar Thick Nectar Thick Liquid: Not tested   Honey Thick Honey Thick Liquid: Not tested   Puree Puree: Within functional limits Presentation: Spoon;Self Fed (4 trials)   Solid     Solid: Within functional limits Presentation: Self Fed (6 trials)        Darla Edward, MS, CCC-SLP Speech Language Pathologist Rehab Services; [facility] [place] [phone] (ascom) Dravin Lance 08/08/2023,6:31 PM

## 2023-08-09 DIAGNOSIS — I10 Essential (primary) hypertension: Secondary | ICD-10-CM | POA: Diagnosis not present

## 2023-08-09 DIAGNOSIS — N179 Acute kidney failure, unspecified: Secondary | ICD-10-CM | POA: Diagnosis not present

## 2023-08-09 DIAGNOSIS — I5032 Chronic diastolic (congestive) heart failure: Secondary | ICD-10-CM | POA: Diagnosis not present

## 2023-08-09 LAB — COMPREHENSIVE METABOLIC PANEL WITH GFR
ALT: 32 U/L (ref 0–44)
AST: 35 U/L (ref 15–41)
Albumin: 3.6 g/dL (ref 3.5–5.0)
Alkaline Phosphatase: 69 U/L (ref 38–126)
Anion gap: 7 (ref 5–15)
BUN: 15 mg/dL (ref 8–23)
CO2: 24 mmol/L (ref 22–32)
Calcium: 10.5 mg/dL — ABNORMAL HIGH (ref 8.9–10.3)
Chloride: 108 mmol/L (ref 98–111)
Creatinine, Ser: 1.5 mg/dL — ABNORMAL HIGH (ref 0.44–1.00)
GFR, Estimated: 37 mL/min — ABNORMAL LOW (ref >60)
Glucose, Bld: 129 mg/dL — ABNORMAL HIGH (ref 70–99)
Potassium: 4.1 mmol/L (ref 3.5–5.1)
Sodium: 139 mmol/L (ref 135–145)
Total Bilirubin: 0.9 mg/dL (ref 0.0–1.2)
Total Protein: 7.5 g/dL (ref 6.5–8.1)

## 2023-08-09 LAB — CBC WITH DIFFERENTIAL/PLATELET
Abs Immature Granulocytes: 0.01 10*3/uL (ref 0.00–0.07)
Basophils Absolute: 0 10*3/uL (ref 0.0–0.1)
Basophils Relative: 0 %
Eosinophils Absolute: 0.2 10*3/uL (ref 0.0–0.5)
Eosinophils Relative: 3 %
HCT: 40.8 % (ref 36.0–46.0)
Hemoglobin: 13.2 g/dL (ref 12.0–15.0)
Immature Granulocytes: 0 %
Lymphocytes Relative: 25 %
Lymphs Abs: 1.4 10*3/uL (ref 0.7–4.0)
MCH: 29.5 pg (ref 26.0–34.0)
MCHC: 32.4 g/dL (ref 30.0–36.0)
MCV: 91.1 fL (ref 80.0–100.0)
Monocytes Absolute: 0.3 10*3/uL (ref 0.1–1.0)
Monocytes Relative: 6 %
Neutro Abs: 3.7 10*3/uL (ref 1.7–7.7)
Neutrophils Relative %: 66 %
Platelets: 130 10*3/uL — ABNORMAL LOW (ref 150–400)
RBC: 4.48 MIL/uL (ref 3.87–5.11)
RDW: 15.2 % (ref 11.5–15.5)
WBC: 5.7 10*3/uL (ref 4.0–10.5)
nRBC: 0 % (ref 0.0–0.2)

## 2023-08-09 LAB — GLUCOSE, CAPILLARY
Glucose-Capillary: 146 mg/dL — ABNORMAL HIGH (ref 70–99)
Glucose-Capillary: 131 mg/dL — ABNORMAL HIGH (ref 70–99)

## 2023-08-09 LAB — CALCITRIOL (1,25 DI-OH VIT D): Vit D, 1,25-Dihydroxy: 15.6 pg/mL — ABNORMAL LOW (ref 24.8–81.5)

## 2023-08-09 LAB — MAGNESIUM: Magnesium: 1.9 mg/dL (ref 1.7–2.4)

## 2023-08-09 MED ORDER — SUCRALFATE 1 G PO TABS: 1.0000 g | ORAL_TABLET | Freq: Three times a day (TID) | ORAL | 0 refills | Status: DC

## 2023-08-09 MED ORDER — PANTOPRAZOLE SODIUM 40 MG PO TBEC: 40.0000 mg | DELAYED_RELEASE_TABLET | Freq: Two times a day (BID) | ORAL | 0 refills | Status: DC

## 2023-08-09 MED ORDER — SUCRALFATE 1 G PO TABS: 1.0000 g | ORAL_TABLET | Freq: Three times a day (TID) | ORAL | Status: DC

## 2023-08-09 MED ORDER — METOPROLOL TARTRATE 25 MG PO TABS: 25.0000 mg | ORAL_TABLET | Freq: Two times a day (BID) | ORAL | Status: DC

## 2023-08-09 NOTE — Discharge Instructions (Signed)
 [facility] [address]  [place], [place]  [phone] Open 24 hours  They will call you to set up when they can come for assessment

## 2023-08-09 NOTE — Progress Notes (Signed)
 PROGRESS NOTE    Katelyn Cooper  XBJ:[id number] DOB: March 12, 1956 DOA: 08/06/2023 PCP: Lamon Pillow, MD  Subjective: Patient seen and examined.  Stable. Calcium  10.5. pt wants to go home. I have asked to stop all vitamins and supplements until she is seen by PCP.   Hospital Course: HPI: Katelyn Cooper is a 68 y.o. female with medical history significant of type 2 diabetes on mounjaro since 05/2023, depression, anxiety, CAD, CHF, COPD, OSA, history of gastric ulcer, who presents to the emergency department for evaluation of generalized weakness.  Patient reports that she just got back from vacation in [place]  on 5/1. She reports not long after getting back she was feeling fatigued but assumed it was due to travel. She began feeling achey all over. She went to bed Monday night and woke up vomiting. When she was changing the sheets she reports she fell back against the wall and hit her right elbow. She did not hit her head or lose consciousness. This morning she attempted a meal replacement drink and she began vomiting again so presented to urgent care, who urged her to come to ED instead.    At present she denies any nausea, chest pain, dyspnea. She denies abdominal pain or bloating sensation but notes that she has no appetite.   In the ED labs are notable for AKI, severe hypercalcemia >14, and CT with distended stomach. She is mildly tachycardic. She was give 500cc NS.  Significant Events: Admitted 08/06/2023 for hypercalcemia   Significant Labs: WBC 12.4, HgB 15.3, plt 221 Na 135, K 3.7, CO2 of 24, BUN 20, Scr 1.91 Ca 14.2, TP 8.4, alb 4.2, AST 42, ALT 45, alk phos 85 Mg 1.9  Significant Imaging Studies: CT head Normal head CT. No abnormality seen to explain the presenting symptoms. CT abd/pelvis Wall thickening of the distal esophagus with surrounding inflammatory stranding and fluid compatible with esophagitis. 2. The stomach is distended with a large amount of debris. Correlate  clinically. 3. Sigmoid colon diverticulosis. 4. Aortic atherosclerosis.  Antibiotic Therapy: Anti-infectives (From admission, onward)    None       Procedures:   Consultants:     Assessment and Plan: * Hypercalcemia 08-07-2023 unclear the cause of her hypercalcemia. Mildly improved with IVF. PTH pending. Will add vit D levels. Asked husband to bring in all of pt's pill bottles so it can be checked for vit D doses and calcium  doses. May need neoplastic workup of this doesn't show anything.  08-08-2023 likely due to vitamin D  toxicity along with overuse of calcium  carbonate.  Awaiting vitamin D  labs.  08-09-2023 vitamin D  25-hydroxy is normal. Her Calcitriol level is still pending. She can f/u with PCP on that lab. Stop all vitamins and calcium . Bid protonix  and carafate for GERD. Stable for DC to home.   AKI (acute kidney injury) (HCC) 08-07-2023 check serum and urine electrophoresis. Continue with IVF. Holding lasix .  08-08-2023 Acute kidney injury is improved. Serum creatinine down to 1.63. Calcium  down to 10.6.   Continue with IV fluids 1 more day.  08-09-2023 Scr 1.5 today.  Pt to remain off lasix , ACEI/ARB, NSAIDs. She will continue to push po liquids at home. Will need repeat BMP in PCP office in 1-2 weeks.   Essential hypertension 08-07-2023 stable. On lopressor .  08-09-2023 stable. Restart lopressor .  Chronic heart failure with preserved ejection fraction (HFpEF) (HCC) 08-07-2023 on lopressor . Holding lasix  due to AKI. Repeat echo ordered. Last echo in 2023 showed LVEF 55%  08-08-2023 lopressor  on hold due to orthostatic hypotension yesterday.  08-09-2023 stable. Restart lopressor . Hold lasix  at discharge for now.  Diabetes mellitus type 2, controlled (HCC) 08-07-2023 on SSI.  08-08-2023 stable  08-09-2023 stable  DVT prophylaxis: enoxaparin  (LOVENOX ) injection 40 mg Start: 08/08/23 2200 Place TED hose Start: 08/07/23 1713    Code Status: Full  Code Family Communication: no family at bedside. Pt is decisional. Disposition Plan: return home Reason for continuing need for hospitalization: stable for DC.  Objective: Vitals:   08/08/23 2028 08/09/23 0344 08/09/23 0828 08/09/23 0830  BP: (!) 132/47 (!) 147/69 (!) 159/84 (!) 158/74  Pulse: 97 (!) 109 (!) 110 (!) 105  Resp: 18 18 16    Temp: 98.8 F (37.1 C) 98.6 F (37 C) 98.6 F (37 C)   TempSrc: Oral Oral    SpO2: 98% 97% 97% 99%  Weight:      Height:        Intake/Output Summary (Last 24 hours) at 08/09/2023 1207 Last data filed at 08/08/2023 1928 Gross per 24 hour  Intake 120 ml  Output --  Net 120 ml   Filed Weights   08/06/23 1140  Weight: 72.8 kg    Examination:  Physical Exam Vitals and nursing note reviewed.  Constitutional:      General: She is not in acute distress.    Appearance: Normal appearance. She is not toxic-appearing or diaphoretic.  HENT:     Head: Normocephalic and atraumatic.     Nose: Nose normal.  Cardiovascular:     Rate and Rhythm: Normal rate and regular rhythm.  Pulmonary:     Effort: Pulmonary effort is normal.     Breath sounds: Normal breath sounds.  Abdominal:     General: Bowel sounds are normal. There is no distension.     Palpations: Abdomen is soft.  Musculoskeletal:     Right lower leg: No edema.     Left lower leg: No edema.  Skin:    General: Skin is warm and dry.     Capillary Refill: Capillary refill takes less than 2 seconds.  Neurological:     Mental Status: She is alert and oriented to person, place, and time.     Data Reviewed: I have personally reviewed following labs and imaging studies  CBC: Recent Labs  Lab 08/06/23 1142 08/06/23 1918 08/07/23 0508 08/08/23 0509 08/09/23 0615  WBC 12.4* 10.1 7.1 5.4 5.7  NEUTROABS  --  7.5 4.5 3.3 3.7  HGB 15.3* 13.9 12.3 12.1 13.2  HCT 46.8* 42.9 37.9 38.5 40.8  MCV 90.0 91.5 92.7 94.4 91.1  PLT 221 169 142* 121* 130*   Basic Metabolic Panel: Recent Labs   Lab 08/06/23 1142 08/06/23 1918 08/08/23 0509 08/09/23 0615  NA 135 136 141 139  K 3.7 3.6 3.0* 4.1  CL 94* 96* 107 108  CO2 24 28 25 24   GLUCOSE 185* 124* 121* 129*  BUN 20 21 18 15   CREATININE 1.91* 1.89* 1.63* 1.50*  CALCIUM  14.2* 13.3* 10.6* 10.5*  MG 1.9  --  1.8 1.9  PHOS  --   --  3.1  --    GFR: Estimated Creatinine Clearance: 33.9 mL/min (A) (by C-G formula based on SCr of 1.5 mg/dL (H)). Liver Function Tests: Recent Labs  Lab 08/06/23 1142 08/06/23 1918 08/08/23 0509 08/09/23 0615  AST 42* 40 33 35  ALT 45* 40 33 32  ALKPHOS 85 79 70 69  BILITOT 0.9 0.8 0.6 0.9  PROT 8.4*  7.5 7.0 7.5  ALBUMIN 4.2 3.7 3.4* 3.6   BNP (last 3 results) Recent Labs    02/25/23 1016  BNP 37.3   HbA1C: Recent Labs    08/06/23 1918  HGBA1C 6.6*   Vitamin D  Level: Lab Results  Component Value Date/Time   VD25OH 64.59 08/08/2023 05:09 AM    CBG: Recent Labs  Lab 08/08/23 1319 08/08/23 1546 08/08/23 2120 08/09/23 0832 08/09/23 1134  GLUCAP 136* 100* 94 146* 131*   Radiology Studies: ECHOCARDIOGRAM COMPLETE Result Date: 08/08/2023    ECHOCARDIOGRAM REPORT   Patient Name:   KASHAYLA NETTO Hadley Date of Exam: 08/07/2023 Medical Rec #:  [id number]       Height:       63.0 in Accession #:    [id number]      Weight:       160.5 lb Date of Birth:  25-Dec-1955       BSA:          1.761 m Patient Age:    68 years        BP:           141/90 mmHg Patient Gender: F               HR:           88 bpm. Exam Location:  [facility] Procedure: 2D Echo, Cardiac Doppler and Color Doppler (Both Spectral and Color            Flow Doppler were utilized during procedure). Indications:     I50.9 Congestive heart failure  History:         Patient has prior history of Echocardiogram examinations, most                  recent 12/01/2021. CHF; Risk Factors:Hypertension, Diabetes and                  Dyslipidemia. Obstructive sleep apnea.  Sonographer:     Brigid Canada RDCS Referring Phys:  [id number]  ALEXANDRA DEZII Diagnosing Phys: Timothy Gollan MD IMPRESSIONS  1. Left ventricular ejection fraction, by estimation, is 55 to 60%. Left ventricular ejection fraction by PLAX is 58 %. The left ventricle has normal function. The left ventricle demonstrates regional wall motion abnormalities (basal to mid septal wall hypokinesis). Left ventricular diastolic parameters are consistent with Grade I diastolic dysfunction (impaired relaxation).  2. Right ventricular systolic function is normal. The right ventricular size is normal. There is normal pulmonary artery systolic pressure. The estimated right ventricular systolic pressure is 27.1 mmHg.  3. The mitral valve is normal in structure. Moderate mitral valve regurgitation. No evidence of mitral stenosis.  4. The aortic valve is tricuspid. Aortic valve regurgitation is not visualized. No aortic stenosis is present.  5. The inferior vena cava is normal in size with greater than 50% respiratory variability, suggesting right atrial pressure of 3 mmHg. FINDINGS  Left Ventricle: Left ventricular ejection fraction, by estimation, is 55 to 60%. Left ventricular ejection fraction by PLAX is 58 %. The left ventricle has normal function. The left ventricle demonstrates regional wall motion abnormalities. Strain was performed and the global longitudinal strain is indeterminate. The left ventricular internal cavity size was normal in size. There is no left ventricular hypertrophy. Left ventricular diastolic parameters are consistent with Grade I diastolic dysfunction  (impaired relaxation). Right Ventricle: The right ventricular size is normal. No increase in right ventricular wall thickness. Right ventricular systolic function is normal. There is normal pulmonary  artery systolic pressure. The tricuspid regurgitant velocity is 2.35 m/s, and  with an assumed right atrial pressure of 5 mmHg, the estimated right ventricular systolic pressure is 27.1 mmHg. Left Atrium: Left atrial size  was normal in size. Right Atrium: Right atrial size was normal in size. Pericardium: There is no evidence of pericardial effusion. Mitral Valve: The mitral valve is normal in structure. Moderate mitral valve regurgitation. No evidence of mitral valve stenosis. Tricuspid Valve: The tricuspid valve is normal in structure. Tricuspid valve regurgitation is mild . No evidence of tricuspid stenosis. Aortic Valve: The aortic valve is tricuspid. Aortic valve regurgitation is not visualized. No aortic stenosis is present. Pulmonic Valve: The pulmonic valve was normal in structure. Pulmonic valve regurgitation is not visualized. No evidence of pulmonic stenosis. Aorta: The aortic root is normal in size and structure. Venous: The inferior vena cava is normal in size with greater than 50% respiratory variability, suggesting right atrial pressure of 3 mmHg. IAS/Shunts: No atrial level shunt detected by color flow Doppler. Additional Comments: 3D was performed not requiring image post processing on an independent workstation and was indeterminate.  LEFT VENTRICLE PLAX 2D LV EF:         Left            Diastology                ventricular     LV e' medial:    7.07 cm/s                ejection        LV E/e' medial:  14.1                fraction by     LV e' lateral:   9.09 cm/s                PLAX is 58      LV E/e' lateral: 11.0                %. LVIDd:         4.30 cm LVIDs:         3.00 cm LV PW:         0.80 cm LV IVS:        0.80 cm LVOT diam:     1.80 cm LV SV:         48 LV SV Index:   27 LVOT Area:     2.54 cm  RIGHT VENTRICLE             IVC RV Basal diam:  3.30 cm     IVC diam: 1.30 cm RV S prime:     16.43 cm/s TAPSE (M-mode): 2.4 cm LEFT ATRIUM             Index        RIGHT ATRIUM          Index LA diam:        3.70 cm 2.10 cm/m   RA Area:     8.54 cm LA Vol (A2C):   30.7 ml 17.43 ml/m  RA Volume:   17.40 ml 9.88 ml/m LA Vol (A4C):   28.6 ml 16.24 ml/m LA Biplane Vol: 29.6 ml 16.81 ml/m  AORTIC VALVE LVOT  Vmax:   103.83 cm/s LVOT Vmean:  71.567 cm/s LVOT VTI:    0.187 m  AORTA Ao Root diam: 2.70 cm Ao Asc diam:  2.80 cm MITRAL VALVE  TRICUSPID VALVE MV Area (PHT): 4.68 cm     TR Peak grad:   22.1 mmHg MV Decel Time: 162 msec     TR Vmax:        235.00 cm/s MV E velocity: 99.60 cm/s MV A velocity: 145.50 cm/s  SHUNTS MV E/A ratio:  0.68         Systemic VTI:  0.19 m                             Systemic Diam: 1.80 cm Belva Boyden MD Electronically signed by Belva Boyden MD Signature Date/Time: 08/08/2023/4:08:19 PM    Final     Scheduled Meds:  aspirin  EC  81 mg Oral Daily   atorvastatin   80 mg Oral Daily   docusate sodium   100 mg Oral Daily   enoxaparin  (LOVENOX ) injection  40 mg Subcutaneous Q24H   insulin  aspart  0-9 Units Subcutaneous TID WC   metoprolol  tartrate  25 mg Oral BID   pantoprazole   40 mg Oral BID   QUEtiapine   100 mg Oral QHS   sucralfate  1 g Oral TID WC & HS   Continuous Infusions:   LOS: 2 days   Time spent: 50 minutes  Unk Garb, DO  [facility]  08/09/2023, 12:07 PM

## 2023-08-09 NOTE — Discharge Summary (Signed)
 [facility] Physician Discharge Summary   Patient name: Katelyn Cooper  Admit date:     08/06/2023  Discharge date: 08/09/2023  Attending Physician: Farrel Hones, Shanell Aden [[id number]]  Discharge Physician: Unk Garb   PCP: Lamon Pillow, MD  Admitted From: Home Disposition:  Home  Recommendations for Outpatient Follow-up:  Follow up with PCP in 1-2 weeks Needs repeat BMP/BMET in PCP office in 1-2 weeks Please follow up on the following pending results: Calcitriol level  Home Health:No Equipment/Devices: None    Discharge Condition:Stable CODE STATUS:FULL Diet recommendation: Diabetic Fluid Restriction: None  Hospital Summary: HPI: Katelyn Cooper is a 68 y.o. female with medical history significant of type 2 diabetes on mounjaro since 05/2023, depression, anxiety, CAD, CHF, COPD, OSA, history of gastric ulcer, who presents to the emergency department for evaluation of generalized weakness.  Patient reports that she just got back from vacation in [place]  on 5/1. She reports not long after getting back she was feeling fatigued but assumed it was due to travel. She began feeling achey all over. She went to bed Monday night and woke up vomiting. When she was changing the sheets she reports she fell back against the wall and hit her right elbow. She did not hit her head or lose consciousness. This morning she attempted a meal replacement drink and she began vomiting again so presented to urgent care, who urged her to come to ED instead.    At present she denies any nausea, chest pain, dyspnea. She denies abdominal pain or bloating sensation but notes that she has no appetite.   In the ED labs are notable for AKI, severe hypercalcemia >14, and CT with distended stomach. She is mildly tachycardic. She was give 500cc NS.  Significant Events: Admitted 08/06/2023 for hypercalcemia   Significant Labs: WBC 12.4, HgB 15.3, plt 221 Na 135, K 3.7, CO2 of 24, BUN 20, Scr 1.91 Ca 14.2, TP 8.4, alb 4.2,  AST 42, ALT 45, alk phos 85 Mg 1.9  Significant Imaging Studies: CT head Normal head CT. No abnormality seen to explain the presenting symptoms. CT abd/pelvis Wall thickening of the distal esophagus with surrounding inflammatory stranding and fluid compatible with esophagitis. 2. The stomach is distended with a large amount of debris. Correlate clinically. 3. Sigmoid colon diverticulosis. 4. Aortic atherosclerosis.  Antibiotic Therapy: Anti-infectives (From admission, onward)    None       Procedures:   Consultants:    Hospital Course by Problem: * Hypercalcemia 08-07-2023 unclear the cause of her hypercalcemia. Mildly improved with IVF. PTH pending. Will add vit D levels. Asked husband to bring in all of pt's pill bottles so it can be checked for vit D doses and calcium  doses. May need neoplastic workup of this doesn't show anything.  08-08-2023 likely due to vitamin D  toxicity along with overuse of calcium  carbonate.  Awaiting vitamin D  labs.  08-09-2023 vitamin D  25-hydroxy is normal. Her Calcitriol level is still pending. She can f/u with PCP on that lab. Stop all vitamins and calcium . Bid protonix  and carafate for GERD. Stable for DC to home.   AKI (acute kidney injury) (HCC) 08-07-2023 check serum and urine electrophoresis. Continue with IVF. Holding lasix .  08-08-2023 Acute kidney injury is improved. Serum creatinine down to 1.63. Calcium  down to 10.6.   Continue with IV fluids 1 more day.  08-09-2023 Scr 1.5 today.  Pt to remain off lasix , ACEI/ARB, NSAIDs. She will continue to push po liquids at home. Will need repeat  BMP in PCP office in 1-2 weeks.   Essential hypertension 08-07-2023 stable. On lopressor .  08-09-2023 stable. Restart lopressor .  Chronic heart failure with preserved ejection fraction (HFpEF) (HCC) 08-07-2023 on lopressor . Holding lasix  due to AKI. Repeat echo ordered. Last echo in 2023 showed LVEF 55%  08-08-2023 lopressor  on hold due to  orthostatic hypotension yesterday.  08-09-2023 stable. Restart lopressor . Hold lasix  at discharge for now.  Diabetes mellitus type 2, controlled (HCC) 08-07-2023 on SSI.  08-08-2023 stable  08-09-2023 stable    Discharge Diagnoses:  Principal Problem:   Hypercalcemia Active Problems:   AKI (acute kidney injury) (HCC)   Diabetes mellitus type 2, controlled (HCC)   Chronic heart failure with preserved ejection fraction (HFpEF) [facility])   Essential hypertension   Discharge Instructions  Discharge Instructions     Ambulatory referral to Gastroenterology   Complete by: As directed    What is the reason for referral?: Other Comment - esophageal stricture?   Call MD for:  difficulty breathing, headache or visual disturbances   Complete by: As directed    Call MD for:  extreme fatigue   Complete by: As directed    Call MD for:  hives   Complete by: As directed    Call MD for:  persistant dizziness or light-headedness   Complete by: As directed    Call MD for:  persistant nausea and vomiting   Complete by: As directed    Call MD for:  redness, tenderness, or signs of infection (pain, swelling, redness, odor or green/yellow discharge around incision site)   Complete by: As directed    Call MD for:  severe uncontrolled pain   Complete by: As directed    Call MD for:  temperature >100.4   Complete by: As directed    Diet - low sodium heart healthy   Complete by: As directed    Diet Carb Modified   Complete by: As directed    Discharge instructions   Complete by: As directed    1. Follow up with your primary care provider in 1-2 weeks following discharge from hospital.   Increase activity slowly   Complete by: As directed       Allergies as of 08/09/2023       Reactions   Lovastatin    depression   Paroxetine Dermatitis   Paroxetine Hcl    itching   Lamotrigine Rash        Medication List     PAUSE taking these medications    furosemide  20 MG tablet Wait to  take this until your doctor or other care provider tells you to start again. Commonly known as: LASIX  Take 1 tablet by mouth every other day and may take 1 tablet by mouth  as needed for increased fluid or weight gain on days that you would normally not take a tablet.   potassium chloride  20 MEQ/15ML (10%) Soln Wait to take this until your doctor or other care provider tells you to start again. Take 7.5 mLs (10 mEq total) by mouth daily as needed (with furosemide ).       STOP taking these medications    b complex vitamins capsule   lansoprazole  15 MG capsule Commonly known as: PREVACID    MULTI-VITAMIN GUMMIES PO   Vitamin D3 125 MCG (5000 UT) Tabs   Vraylar  1.5 MG capsule Generic drug: cariprazine        TAKE these medications    acetaminophen  500 MG tablet Commonly known as: TYLENOL  Take 500  mg by mouth every 6 (six) hours as needed.   albuterol  108 (90 Base) MCG/ACT inhaler Commonly known as: VENTOLIN  HFA inhale 2 puffs by mouth every 6 hours if needed for wheezing or shortness of breath   aspirin  EC 81 MG tablet Take 1 tablet (81 mg total) by mouth daily.   atorvastatin  80 MG tablet Commonly known as: LIPITOR Take 1 tablet by mouth daily.   cyclobenzaprine  10 MG tablet Commonly known as: FLEXERIL  Take 10 mg by mouth 3 (three) times daily.   doxepin  10 MG capsule Commonly known as: SINEQUAN  Take 10 mg by mouth at bedtime as needed (insomnia). What changed: Another medication with the same name was removed. Continue taking this medication, and follow the directions you see here.   ezetimibe  10 MG tablet Commonly known as: ZETIA  Take 1 tablet by mouth daily.   fluticasone  50 MCG/ACT nasal spray Commonly known as: FLONASE  SPRAY 2 SPRAYS INTO EACH NOSTRIL EVERY DAY   glucose blood test strip ONETOUCH ULTRA MINI STRIPS AND LANCETS. Use one daily to check blood sugar.   metFORMIN  500 MG 24 hr tablet Commonly known as: GLUCOPHAGE -XR Take 1 tablet (500 mg  total) by mouth 2 (two) times daily.   metoprolol  tartrate 25 MG tablet Commonly known as: LOPRESSOR  Take 1 tablet by mouth twice daily.   pantoprazole  40 MG tablet Commonly known as: PROTONIX  Take 1 tablet (40 mg total) by mouth 2 (two) times daily.   QUEtiapine  100 MG tablet Commonly known as: SEROQUEL  Take 1 tablet (100 mg total) by mouth at bedtime. Start taking on: Aug 13, 2023 What changed: These instructions start on Aug 13, 2023. If you are unsure what to do until then, ask your doctor or other care provider.   sucralfate 1 g tablet Commonly known as: CARAFATE Take 1 tablet (1 g total) by mouth 4 (four) times daily -  with meals and at bedtime.   venlafaxine  XR 75 MG 24 hr capsule Commonly known as: EFFEXOR -XR Take 1 capsule (75 mg total) by mouth daily with breakfast. Start taking on: Aug 16, 2023        Allergies  Allergen Reactions   Lovastatin     depression   Paroxetine Dermatitis   Paroxetine Hcl     itching   Lamotrigine Rash    Discharge Exam: Vitals:   08/09/23 0828 08/09/23 0830  BP: (!) 159/84 (!) 158/74  Pulse: (!) 110 (!) 105  Resp: 16   Temp: 98.6 F (37 C)   SpO2: 97% 99%    Physical Exam Vitals and nursing note reviewed.  Constitutional:      General: She is not in acute distress.    Appearance: Normal appearance. She is not toxic-appearing or diaphoretic.  HENT:     Head: Normocephalic and atraumatic.     Nose: Nose normal.  Cardiovascular:     Rate and Rhythm: Normal rate and regular rhythm.  Pulmonary:     Effort: Pulmonary effort is normal.     Breath sounds: Normal breath sounds.  Abdominal:     General: Bowel sounds are normal. There is no distension.     Palpations: Abdomen is soft.  Musculoskeletal:     Right lower leg: No edema.     Left lower leg: No edema.  Skin:    General: Skin is warm and dry.     Capillary Refill: Capillary refill takes less than 2 seconds.  Neurological:     Mental Status: She is alert  and oriented to person, place, and time.     The results of significant diagnostics from this hospitalization (including imaging, microbiology, ancillary and laboratory) are listed below for reference.     Labs: BNP (last 3 results) Recent Labs    02/25/23 1016  BNP 37.3   Basic Metabolic Panel: Recent Labs  Lab 08/06/23 1142 08/06/23 1918 08/08/23 0509 08/09/23 0615  NA 135 136 141 139  K 3.7 3.6 3.0* 4.1  CL 94* 96* 107 108  CO2 24 28 25 24   GLUCOSE 185* 124* 121* 129*  BUN 20 21 18 15   CREATININE 1.91* 1.89* 1.63* 1.50*  CALCIUM  14.2* 13.3* 10.6* 10.5*  MG 1.9  --  1.8 1.9  PHOS  --   --  3.1  --    Liver Function Tests: Recent Labs  Lab 08/06/23 1142 08/06/23 1918 08/08/23 0509 08/09/23 0615  AST 42* 40 33 35  ALT 45* 40 33 32  ALKPHOS 85 79 70 69  BILITOT 0.9 0.8 0.6 0.9  PROT 8.4* 7.5 7.0 7.5  ALBUMIN 4.2 3.7 3.4* 3.6   CBC: Recent Labs  Lab 08/06/23 1142 08/06/23 1918 08/07/23 0508 08/08/23 0509 08/09/23 0615  WBC 12.4* 10.1 7.1 5.4 5.7  NEUTROABS  --  7.5 4.5 3.3 3.7  HGB 15.3* 13.9 12.3 12.1 13.2  HCT 46.8* 42.9 37.9 38.5 40.8  MCV 90.0 91.5 92.7 94.4 91.1  PLT 221 169 142* 121* 130*   CBG: Recent Labs  Lab 08/08/23 1319 08/08/23 1546 08/08/23 2120 08/09/23 0832 08/09/23 1134  GLUCAP 136* 100* 94 146* 131*   Hgb A1c Recent Labs    08/06/23 1918  HGBA1C 6.6*   Vitamin D  Level: Lab Results  Component Value Date/Time   VD25OH 64.59 08/08/2023 05:09 AM   Sepsis Labs Recent Labs  Lab 08/06/23 1918 08/07/23 0508 08/08/23 0509 08/09/23 0615  WBC 10.1 7.1 5.4 5.7    Procedures/Studies: ECHOCARDIOGRAM COMPLETE Result Date: 08/08/2023    ECHOCARDIOGRAM REPORT   Patient Name:   SHERITA PICKELL Simoni Date of Exam: 08/07/2023 Medical Rec #:  [id number]       Height:       63.0 in Accession #:    [id number]      Weight:       160.5 lb Date of Birth:  1955/08/14       BSA:          1.761 m Patient Age:    68 years        BP:            141/90 mmHg Patient Gender: F               HR:           88 bpm. Exam Location:  [facility] Procedure: 2D Echo, Cardiac Doppler and Color Doppler (Both Spectral and Color            Flow Doppler were utilized during procedure). Indications:     I50.9 Congestive heart failure  History:         Patient has prior history of Echocardiogram examinations, most                  recent 12/01/2021. CHF; Risk Factors:Hypertension, Diabetes and                  Dyslipidemia. Obstructive sleep apnea.  Sonographer:     Brigid Canada RDCS Referring Phys:  [id number] ALEXANDRA DEZII Diagnosing Phys: Timothy Gollan MD IMPRESSIONS  1. Left ventricular ejection fraction, by estimation, is 55 to 60%. Left ventricular ejection fraction by PLAX is 58 %. The left ventricle has normal function. The left ventricle demonstrates regional wall motion abnormalities (basal to mid septal wall hypokinesis). Left ventricular diastolic parameters are consistent with Grade I diastolic dysfunction (impaired relaxation).  2. Right ventricular systolic function is normal. The right ventricular size is normal. There is normal pulmonary artery systolic pressure. The estimated right ventricular systolic pressure is 27.1 mmHg.  3. The mitral valve is normal in structure. Moderate mitral valve regurgitation. No evidence of mitral stenosis.  4. The aortic valve is tricuspid. Aortic valve regurgitation is not visualized. No aortic stenosis is present.  5. The inferior vena cava is normal in size with greater than 50% respiratory variability, suggesting right atrial pressure of 3 mmHg. FINDINGS  Left Ventricle: Left ventricular ejection fraction, by estimation, is 55 to 60%. Left ventricular ejection fraction by PLAX is 58 %. The left ventricle has normal function. The left ventricle demonstrates regional wall motion abnormalities. Strain was performed and the global longitudinal strain is indeterminate. The left ventricular internal cavity size was normal in  size. There is no left ventricular hypertrophy. Left ventricular diastolic parameters are consistent with Grade I diastolic dysfunction  (impaired relaxation). Right Ventricle: The right ventricular size is normal. No increase in right ventricular wall thickness. Right ventricular systolic function is normal. There is normal pulmonary artery systolic pressure. The tricuspid regurgitant velocity is 2.35 m/s, and  with an assumed right atrial pressure of 5 mmHg, the estimated right ventricular systolic pressure is 27.1 mmHg. Left Atrium: Left atrial size was normal in size. Right Atrium: Right atrial size was normal in size. Pericardium: There is no evidence of pericardial effusion. Mitral Valve: The mitral valve is normal in structure. Moderate mitral valve regurgitation. No evidence of mitral valve stenosis. Tricuspid Valve: The tricuspid valve is normal in structure. Tricuspid valve regurgitation is mild . No evidence of tricuspid stenosis. Aortic Valve: The aortic valve is tricuspid. Aortic valve regurgitation is not visualized. No aortic stenosis is present. Pulmonic Valve: The pulmonic valve was normal in structure. Pulmonic valve regurgitation is not visualized. No evidence of pulmonic stenosis. Aorta: The aortic root is normal in size and structure. Venous: The inferior vena cava is normal in size with greater than 50% respiratory variability, suggesting right atrial pressure of 3 mmHg. IAS/Shunts: No atrial level shunt detected by color flow Doppler. Additional Comments: 3D was performed not requiring image post processing on an independent workstation and was indeterminate.  LEFT VENTRICLE PLAX 2D LV EF:         Left            Diastology                ventricular     LV e' medial:    7.07 cm/s                ejection        LV E/e' medial:  14.1                fraction by     LV e' lateral:   9.09 cm/s                PLAX is 58      LV E/e' lateral: 11.0                %. LVIDd:  4.30 cm LVIDs:          3.00 cm LV PW:         0.80 cm LV IVS:        0.80 cm LVOT diam:     1.80 cm LV SV:         48 LV SV Index:   27 LVOT Area:     2.54 cm  RIGHT VENTRICLE             IVC RV Basal diam:  3.30 cm     IVC diam: 1.30 cm RV S prime:     16.43 cm/s TAPSE (M-mode): 2.4 cm LEFT ATRIUM             Index        RIGHT ATRIUM          Index LA diam:        3.70 cm 2.10 cm/m   RA Area:     8.54 cm LA Vol (A2C):   30.7 ml 17.43 ml/m  RA Volume:   17.40 ml 9.88 ml/m LA Vol (A4C):   28.6 ml 16.24 ml/m LA Biplane Vol: 29.6 ml 16.81 ml/m  AORTIC VALVE LVOT Vmax:   103.83 cm/s LVOT Vmean:  71.567 cm/s LVOT VTI:    0.187 m  AORTA Ao Root diam: 2.70 cm Ao Asc diam:  2.80 cm MITRAL VALVE                TRICUSPID VALVE MV Area (PHT): 4.68 cm     TR Peak grad:   22.1 mmHg MV Decel Time: 162 msec     TR Vmax:        235.00 cm/s MV E velocity: 99.60 cm/s MV A velocity: 145.50 cm/s  SHUNTS MV E/A ratio:  0.68         Systemic VTI:  0.19 m                             Systemic Diam: 1.80 cm Belva Boyden MD Electronically signed by Belva Boyden MD Signature Date/Time: 08/08/2023/4:08:19 PM    Final    CT ABDOMEN PELVIS WO CONTRAST Result Date: 08/06/2023 CLINICAL DATA:  Acute abdominal pain a EXAM: CT ABDOMEN AND PELVIS WITHOUT CONTRAST TECHNIQUE: Multidetector CT imaging of the abdomen and pelvis was performed following the standard protocol without IV contrast. RADIATION DOSE REDUCTION: This exam was performed according to the departmental dose-optimization program which includes automated exposure control, adjustment of the mA and/or kV according to patient size and/or use of iterative reconstruction technique. COMPARISON:  CT abdomen and pelvis 02/06/2010 FINDINGS: Lower chest: No acute abnormality. Hepatobiliary: No focal liver abnormality is seen. No gallstones, gallbladder wall thickening, or biliary dilatation. Pancreas: Unremarkable. No pancreatic ductal dilatation or surrounding inflammatory changes. Spleen: Normal in  size without focal abnormality. Adrenals/Urinary Tract: Adrenal glands are unremarkable. Kidneys are normal, without renal calculi, focal lesion, or hydronephrosis. Bladder is unremarkable. Stomach/Bowel: The stomach is distended with a large amount of debris. There is wall thickening of the distal esophagus with mild surrounding inflammatory stranding and fluid. No evidence of bowel wall thickening, distention, or inflammatory changes. There is sigmoid colon diverticulosis. The appendix is not visualized. Vascular/Lymphatic: Aortic atherosclerosis. No enlarged abdominal or pelvic lymph nodes. Reproductive: Uterus and bilateral adnexa are unremarkable. Other: No abdominal wall hernia or abnormality. No abdominopelvic ascites. Musculoskeletal: No acute or significant osseous findings. IMPRESSION: 1. Wall thickening of the  distal esophagus with surrounding inflammatory stranding and fluid compatible with esophagitis. 2. The stomach is distended with a large amount of debris. Correlate clinically. 3. Sigmoid colon diverticulosis. 4. Aortic atherosclerosis. Aortic Atherosclerosis (ICD10-I70.0). Electronically Signed   By: Tyron Gallon M.D.   On: 08/06/2023 16:24   CT HEAD WO CONTRAST ( ) Result Date: 08/06/2023 CLINICAL DATA:  Syncope/presyncope. Cerebrovascular cause suspected. Dizziness. EXAM: CT HEAD WITHOUT CONTRAST TECHNIQUE: Contiguous axial images were obtained from the base of the skull through the vertex without intravenous contrast. RADIATION DOSE REDUCTION: This exam was performed according to the departmental dose-optimization program which includes automated exposure control, adjustment of the mA and/or kV according to patient size and/or use of iterative reconstruction technique. COMPARISON:  MRI 10/18/2021 FINDINGS: Brain: No sign of acute or subacute stroke. No mass, hemorrhage, hydrocephalus or extra-axial collection. Minimal small vessel changes of the white matter shown by MRI are not appreciable  by CT. Vascular: No abnormal vascular finding. Skull: Normal Sinuses/Orbits: Clear/normal Other: None IMPRESSION: Normal head CT. No abnormality seen to explain the presenting symptoms. Electronically Signed   By: Bettylou Brunner M.D.   On: 08/06/2023 12:09    Time coordinating discharge: 50 mins  SIGNED:  Unk Garb, DO [facility] 08/09/23, 12:09 PM

## 2023-08-09 NOTE — TOC Initial Note (Signed)
 Transition of Care [facility]) - Initial/Assessment Note    Patient Details  Name: Katelyn Cooper MRN: [id number] Date of Birth: [birth year]  Transition of Care [facility]) CM/SW Contact:    Loman Risk, RN Phone Number: 08/09/2023, 1:00 PM  Clinical Narrative:                    Admitted [id number]  Admitted from: home with husband PCP: [facility]    Therapy recommending home health.  Patient in agreement and states that she does not have a preference of agency.  Referral made to [facility]       Patient Goals and CMS Choice            Expected Discharge Plan and Services         Expected Discharge Date: 08/09/23                                    Prior Living Arrangements/Services                       Activities of Daily Living   ADL Screening (condition at time of admission) Independently performs ADLs?: No Does the patient have a NEW difficulty with bathing/dressing/toileting/self-feeding that is expected to last >3 days?: Yes (Initiates electronic notice to provider for possible OT consult) Does the patient have a NEW difficulty with getting in/out of bed, walking, or climbing stairs that is expected to last >3 days?: Yes (Initiates electronic notice to provider for possible PT consult) Does the patient have a NEW difficulty with communication that is expected to last >3 days?: No Is the patient deaf or have difficulty hearing?: No Does the patient have difficulty seeing, even when wearing glasses/contacts?: No Does the patient have difficulty concentrating, remembering, or making decisions?: No  Permission Sought/Granted                  Emotional Assessment              Admission diagnosis:  Hypercalcemia [E83.52] Generalized weakness [R53.1] Acute kidney injury (HCC) [N17.9] Patient Active Problem List   Diagnosis Date Noted   AKI (acute kidney injury) (HCC) 08/07/2023   Hypercalcemia 08/06/2023   Centrilobular  emphysema (HCC) 06/07/2022   Insomnia 12/18/2021   MDD (major depressive disorder), recurrent, in partial remission (HCC) 12/18/2021   PSVT (paroxysmal supraventricular tachycardia) (HCC) 08/10/2020   Pain 05/31/2020   Vasovagal syncope 05/31/2020   Type 2 diabetes mellitus with diabetic peripheral angiopathy without gangrene, without long-term current use of insulin  (HCC) 03/16/2020   Bipolar disorder in remission (HCC) 02/09/2019   Post traumatic stress disorder (PTSD) 02/09/2019   Chronic heart failure with preserved ejection fraction (HFpEF) (HCC) 11/15/2017   Essential hypertension 11/15/2017   Obstructive sleep apnea 11/15/2017   NICM (nonischemic cardiomyopathy) (HCC) 11/13/2017   CAD in native artery 11/13/2017   Menopausal symptom 06/13/2017   Osteopenia 06/13/2017   Uterine leiomyoma 06/13/2017   PAD (peripheral artery disease) (HCC) 09/19/2016   Hyperlipidemia associated with type 2 diabetes mellitus (HCC) 09/19/2016   Coronary artery disease involving native coronary artery of native heart without angina pectoris 09/05/2016   Dermatitis, nummular 01/05/2015   Allergic rhinitis 12/23/2014   Ataxia 12/23/2014   Back ache 12/23/2014   Cervical muscle strain 12/23/2014   Depression 12/23/2014   Eczema 12/23/2014   GERD (gastroesophageal reflux disease) 12/23/2014   History  of anemia 12/23/2014   Pure hypercholesterolemia 12/23/2014   Tremor 12/23/2014   Vitamin D  deficiency 11/03/2013   Diabetes mellitus type 2, controlled (HCC) 08/13/2011   Tobacco use 05/10/2011   PCP:  Lamon Pillow, MD Pharmacy:   [email or web address] - [place], [place] - [address] [place] [address] Phone: [phone] Fax: [phone]  [id number] [place], [place] - [address] [place] [address] Phone: [phone] Fax: [phone]     Social Drivers of Health (SDOH) Social  History: SDOH Screenings   Food Insecurity: No Food Insecurity (08/07/2023)  Housing: Low Risk  (08/07/2023)  Transportation Needs: No Transportation Needs (08/07/2023)  Utilities: Not At Risk (08/07/2023)  Alcohol Screen: Low Risk  (01/30/2022)  Depression (PHQ2-9): Low Risk  (02/25/2023)  Financial Resource Strain: Low Risk  (12/12/2021)  Physical Activity: Inactive (12/12/2021)  Social Connections: Socially Isolated (08/07/2023)  Stress: No Stress Concern Present (12/12/2021)  Tobacco Use: Medium Risk (08/06/2023)   SDOH Interventions:     Readmission Risk Interventions     No data to display

## 2023-08-10 ENCOUNTER — Other Ambulatory Visit: Payer: Self-pay | Admitting: Physician Assistant

## 2023-08-10 LAB — PROTEIN ELECTRO, RANDOM URINE
Albumin ELP, Urine: 25.6 %
Alpha-1-Globulin, U: 5.6 %
Alpha-2-Globulin, U: 14.6 %
Beta Globulin, U: 30.4 %
Gamma Globulin, U: 23.8 %
Total Protein, Urine: 23.5 mg/dL

## 2023-08-12 NOTE — Progress Notes (Unsigned)
 Cardiology Office Note    Date:  08/13/2023   ID:  Katelyn Cooper, DOB Jun 02, 1955, MRN [id number]  PCP:  Lamon Pillow, MD  Cardiologist:  Sammy Crisp, MD  Electrophysiologist:  None   Chief Complaint: Follow-up  History of Present Illness:   Katelyn Cooper is a 68 y.o. female with history of nonobstructive CAD, PAD status post left iliac stenting, HFpEF, mild to moderate mitral regurgitation, vasovagal syncope, DM2, thoracic outlet syndrome status post bilateral rib resections in 1990, HLD, COPD with ongoing tobacco use, GERD complicated by esophageal stricture, OSA on CPAP, cervical radiculopathy, and anxiety/depression who presents for follow-up of CAD.   LHC from 01/2016 demonstrated 40% stenosis in the proximal aspect of a large first septal perforator along with 30% OM1 stenosis and 30% proximal and mid RCA stenoses with an LVEDP of 15 to 20 mmHg.  R/LHC in 10/2017 showed 30% stenosis at the first septal perforator, OM1 30% stenosis, sequential 30% proximal and mid RCA stenoses, upper normal filling pressures and normal pulmonary artery pressure with a moderately reduced cardiac output as outlined below.  Lexiscan  MPI from 03/2019 was low risk without evidence of ischemia or scar coronary artery calcification noted.  LVEF greater than 65%.  Echo from 04/2019 showed an EF of 60 to 65%, grade 1 diastolic dysfunction with elevated filling pressures, normal RV size and systolic function, no significant valvular abnormality, and normal CVP.   She was evaluated for syncope in 05/2020 that occurred after stubbing her toe with associated severe pain.  Outpatient cardiac monitor in 05/2020 showed a predominant rhythm of sinus with 5 runs of PSVT with the longest episode lasting 18.8 seconds as well as rare PACs and PVCs.  Episode of syncope was felt to be precipitated by vasovagal episode complicated by orthostatic hypotension in the setting of dehydration.   She was admitted to the hospital  in 09/2021 with lower extremity weakness with noted decreased oral intake and dysphagia following a cervical fusion in the context of cervical collar.  High-sensitivity troponin negative x2.  Blood cultures negative.  CBC consistent with hemoconcentration.  She was evaluated by orthopedics and PT with recommendation for home health PT.   She was seen in the office in 10/2021 and reported several episodes of profound lower extremity weakness leading up to her hospital admission, which was felt to be noncardiac in etiology.  Echo in 12/2021 showed an EF of 55 to 60%, septal wall hypokinesis consistent with known left bundle branch block, normal LV diastolic function parameters, normal RV systolic function and ventricular cavity size, mildly elevated PASP estimated at 36.4 mmHg, mild to moderate mitral regurgitation, and an estimated right atrial pressure of 3 mmHg.  Zio patch at that time showed a predominant rhythm of sinus with an average rate of 85 bpm (range 66 to 132 bpm), a single atrial run lasting 5 beats was noted, rare PACs and PVCs, no sustained arrhythmias or prolonged pauses.  No patient triggered events.  She was last seen in the office in 05/2022 and was doing well from a cardiac perspective.  She did note some bilateral thigh discomfort and weakness.  She continued to smoke 1 pack/day.  Subsequent ABIs in 07/2022 were normal and the previously placed left common iliac artery stent was patent.  She was admitted to the hospital from 5/6 through 08/09/2023 with generalized weakness after returning from [place]  with associated achiness and emesis.  She was found to have AKI with a serum creatinine of  1.91 and was hypercalcemic with a calcium  of 14.2.  CT showed no acute intracranial pathology.  CT of the abdomen/pelvis was compatible with esophagitis and with a distended segment.  Echo showed an EF of 55 to 60%, basal to mid septal wall hypokinesis, grade 1 diastolic dysfunction, normal RV systolic function,  ventricular cavity size, and RVSP, moderate mitral valve regurgitation, and an estimated right atrial pressure of 3 mmHg.  She had symptomatic improvement in AKI with the holding of furosemide  and IV fluids.  She comes in accompanied by her husband today and is without symptoms of angina or cardiac decompensation.  She continues to note generalized malaise and fatigue with weakness since her hospitalization.  She has had two near falls versus near syncopal episodes since hospital discharge.  She continues to struggle with concentration, vision changes, and tremor in her upper extremities.  She indicates she can no longer read her handwriting.  No significant dyspnea, lower extremity swelling, orthopnea, or early satiety.  Quit smoking in 11/2022.   Labs independently reviewed: 08/2023 - potassium 4.1, BUN 15, serum creatinine 1.5, albumin 3.6, AST/ALT normal, Hgb 13.2, PLT 130, magnesium 1.9, A1c 6.6 02/2023 - BNP 37, TC 126, TG 232, HDL 38, LDL 51 09/2021 - TSH normal  Past Medical History:  Diagnosis Date   (HFpEF) heart failure with preserved ejection fraction (HCC)    a. 12/2015 Echo: EF 55-60%, nl RV size/fxn, no significant valvular abnormalities; b. 10/2017 Echo: EF 50-55%, antsept, ant HK. Gr2 DD. Mild MR. Nl RV fxn. PASP .   Actinic keratosis    Anxiety    Basal cell carcinoma 08/08/2017   L nose above mid nasal alar groove   CHF (congestive heart failure) (HCC)    Depression    Diabetes type 2, controlled (HCC)    diet-controlled   Esophageal stricture    GERD (gastroesophageal reflux disease)    Hiatal hernia    History of chicken pox    Hyperlipidemia    Hypertension    Internal hemorrhoids    Non-obstructive CAD (coronary artery disease)    a. 12/2015 MV: apical ant and apical reversible defect. EF 55-65%; b. 01/2016 Cath: LM nl, LAD nl, SP1 40ost, LCX nl, OM1 30, RCA 30p/m, EDP 15-82mmHg; c. 10/2017 Cath: LM nl, LAD nl, SP1 30ost, LCX nl, OM1 30, RCA 30p/m. EF 55%.    Obstructive sleep apnea    PAD (peripheral artery disease) (HCC)    a. 1990's s/p prior LCIA stenting; b. 12/2015 ABI: R 1.05, L 1.03.   Panic disorder     Past Surgical History:  Procedure Laterality Date   AORTA SURGERY  1999   stent placement; ? left iliac artery intervention   APPENDECTOMY  1998   BASAL CELL CARCINOMA EXCISION     C5 - fusion N/A 10/10/2021   CARDIAC CATHETERIZATION N/A 01/12/2016   Procedure: Left Heart Cath and Coronary Angiography;  Surgeon: Sammy Crisp, MD;  Location[facility] INVASIVE CV LAB;  Service: Cardiovascular;  Laterality: N/A;   COLONOSCOPY  07/17/2019   FOOT SURGERY     Right   pap smear  03/2010   Done by Dr. Renea Carrion, normal   REFRACTIVE SURGERY     right   RIB RESECTION  [id number]   RIGHT/LEFT HEART CATH AND CORONARY ANGIOGRAPHY N/A 11/08/2017   Procedure: RIGHT/LEFT HEART CATH AND CORONARY ANGIOGRAPHY;  Surgeon: Wenona Hamilton, MD;  Location: [facility] INVASIVE CV LAB;  Service: Cardiovascular;  Laterality: N/A;   SHOULDER SURGERY  2005   left   UPPER GASTROINTESTINAL ENDOSCOPY  07/17/2019   UPPER GI ENDOSCOPY  09/19/2011   Stricture at GE junction, dilated. Mild gastritis and duodenitis. Small hiatal hernia    Current Medications: Current Meds  Medication Sig   acetaminophen  (TYLENOL ) 500 MG tablet Take 500 mg by mouth every 6 (six) hours as needed.   albuterol  (VENTOLIN  HFA) 108 (90 Base) MCG/ACT inhaler inhale 2 puffs by mouth every 6 hours if needed for wheezing or shortness of breath   aspirin  EC 81 MG tablet Take 1 tablet (81 mg total) by mouth daily.   atorvastatin  (LIPITOR) 80 MG tablet Take 1 tablet by mouth daily.   doxepin  (SINEQUAN ) 10 MG capsule Take 10 mg by mouth at bedtime as needed (insomnia).   ezetimibe  (ZETIA ) 10 MG tablet Take 1 tablet by mouth daily.   glucose blood test strip ONETOUCH ULTRA MINI STRIPS AND LANCETS. Use one daily to check blood sugar.   metFORMIN  (GLUCOPHAGE -XR) 500 MG 24 hr tablet Take 1 tablet  (500 mg total) by mouth 2 (two) times daily.   pantoprazole  (PROTONIX ) 40 MG tablet Take 1 tablet (40 mg total) by mouth 2 (two) times daily.   QUEtiapine  (SEROQUEL ) 100 MG tablet Take 1 tablet (100 mg total) by mouth at bedtime.   sucralfate (CARAFATE) 1 g tablet Take 1 tablet (1 g total) by mouth 4 (four) times daily -  with meals and at bedtime.   [START ON 08/16/2023] venlafaxine  XR (EFFEXOR -XR) 75 MG 24 hr capsule Take 1 capsule (75 mg total) by mouth daily with breakfast.   [DISCONTINUED] metoprolol  tartrate (LOPRESSOR ) 25 MG tablet Take 1 tablet by mouth twice daily.    Allergies:   Lovastatin, Paroxetine, Paroxetine hcl, and Lamotrigine   Social History   Socioeconomic History   Marital status: Married    Spouse name: mark   Number of children: 0   Years of education: Not on file   Highest education level: High school graduate  Occupational History   Occupation: computer-retired    Employer: [employer]  Tobacco Use   Smoking status: Former    Current packs/day: 0.00    Types: Cigarettes    Quit date: 11/16/2022    Years since quitting: 0.7   Smokeless tobacco: Never  Vaping Use   Vaping status: Former  Substance and Sexual Activity   Alcohol use: No    Alcohol/week: 0.0 standard drinks of alcohol   Drug use: No   Sexual activity: Not Currently  Other Topics Concern   Not on file  Social History Narrative   Not on file   Social Drivers of Health   Financial Resource Strain: Low Risk  (12/12/2021)   Overall Financial Resource Strain (CARDIA)    Difficulty of Paying Living Expenses: Not hard at all  Food Insecurity: No Food Insecurity (08/07/2023)   Hunger Vital Sign    Worried About Running Out of Food in the Last Year: Never true    Ran Out of Food in the Last Year: Never true  Transportation Needs: No Transportation Needs (08/07/2023)   PRAPARE - Administrator, Civil Service (Medical): No    Lack of Transportation (Non-Medical): No  Physical Activity:  Inactive (12/12/2021)   Exercise Vital Sign    Days of Exercise per Week: 0 days    Minutes of Exercise per Session: 0 min  Stress: No Stress Concern Present (12/12/2021)   [employer] of Occupational Health - Occupational Stress Questionnaire  Feeling of Stress : Only a little  Social Connections: Socially Isolated (08/07/2023)   Social Connection and Isolation Panel [NHANES]    Frequency of Communication with Friends and Family: Once a week    Frequency of Social Gatherings with Friends and Family: Never    Attends Religious Services: Never    Diplomatic Services operational officer: No    Attends Engineer, structural: Never    Marital Status: Married     Family History:  The patient's family history includes Alcohol abuse in her father, mother, and another family member; Bipolar disorder in her sister and another family member; Breast cancer in her sister; Cancer in her mother; Depression in her mother and another family member; Drug abuse in her sister; Emphysema in her mother and sister; Esophageal cancer in her maternal aunt; Heart disease in her maternal grandmother, mother, and another family member; Lung cancer in her mother; Mood Disorder in her father; Stroke in her maternal grandmother; Vision loss in an other family member. There is no history of Colon cancer, Colon polyps, Rectal cancer, or Stomach cancer.  ROS:   12-point review of systems is negative unless otherwise noted in the HPI.   EKGs/Labs/Other Studies Reviewed:    Studies reviewed were summarized above. The additional studies were reviewed today:  2D echo 08/07/2023: 1. Left ventricular ejection fraction, by estimation, is 55 to 60%. Left  ventricular ejection fraction by PLAX is 58 %. The left ventricle has  normal function. The left ventricle demonstrates regional wall motion  abnormalities (basal to mid septal wall  hypokinesis). Left ventricular diastolic parameters are consistent with  Grade  I diastolic dysfunction (impaired relaxation).   2. Right ventricular systolic function is normal. The right ventricular  size is normal. There is normal pulmonary artery systolic pressure. The  estimated right ventricular systolic pressure is 27.1 mmHg.   3. The mitral valve is normal in structure. Moderate mitral valve  regurgitation. No evidence of mitral stenosis.   4. The aortic valve is tricuspid. Aortic valve regurgitation is not  visualized. No aortic stenosis is present.   5. The inferior vena cava is normal in size with greater than 50%  respiratory variability, suggesting right atrial pressure of 3 mmHg.  __________  2D echo 12/01/2021: 1. Left ventricular ejection fraction, by estimation, is 55 to 60%. The  left ventricle has normal function. Septal wall hypokinesis consistent  with bundle branch block. Left ventricular diastolic parameters were  normal.   2. Right ventricular systolic function is normal. The right ventricular  size is normal. There is mildly elevated pulmonary artery systolic  pressure. The estimated right ventricular systolic pressure is 36.4 mmHg.   3. The mitral valve is normal in structure. Mild to moderate mitral valve  regurgitation. No evidence of mitral stenosis.   4. The aortic valve is normal in structure. Aortic valve regurgitation is  not visualized. No aortic stenosis is present.   5. The inferior vena cava is normal in size with greater than 50%  respiratory variability, suggesting right atrial pressure of 3 mmHg.  __________   Zio patch 10/2021:   The patient was monitored for 13 days, 14 hours.   The predominant rhythm was sinus with an average rate of 85 bpm (range 66-132 bpm).   There were rare PACs and PVCs.  A single atrial run lasting 5 beats occurred, with a maximum rate of 111 bpm.   No sustained arrhythmia or prolonged pause was identified.  There were no patient triggered events.   Predominantly sinus rhythm with rare PACs and  PVCs as well as a single brief supraventricular run.  No significant arrhythmia observed. __________   Zio patch 05/2020: The patient was monitored for 13 days, 19 hours. The predominant rhythm was sinus with an average rate of 84 bpm (range 65-143 bpm in sinus). There were rare PAC's and PVC's. Five atrial runs lasting up to 18.8 seconds occurred with a maximum rate of 156 bpm. No sustained arrhythmia or prolonged pause was identified. There were no patient triggered events.   Predominantly sinus rhythm with a few episodes of PSVT as well as rare PAC's and PVC's. __________   2D echo 04/14/2019: 1. Left ventricular ejection fraction, by visual estimation, is 60 to  65%. The left ventricle has normal function. There is no left ventricular  hypertrophy.   2. Elevated left atrial pressure.   3. Left ventricular diastolic parameters are consistent with Grade I  diastolic dysfunction (impaired relaxation).   4. The left ventricle has no regional wall motion abnormalities.   5. Global right ventricle has normal systolic function.The right  ventricular size is normal. No increase in right ventricular wall  thickness.   6. Left atrial size was normal.   7. Right atrial size was normal.   8. The mitral valve is normal in structure. Trivial mitral valve  regurgitation. No evidence of mitral stenosis.   9. The tricuspid valve is not well visualized.  10. The aortic valve was not well visualized. Aortic valve regurgitation  is not visualized. No evidence of aortic valve sclerosis or stenosis.  11. The pulmonic valve was not well visualized. Pulmonic valve  regurgitation is not visualized.  12. TR signal is inadequate for assessing pulmonary artery systolic  pressure.  13. The inferior vena cava is normal in size with greater than 50%  respiratory variability, suggesting right atrial pressure of 3 mmHg.  14. The interatrial septum was not well visualized.   In comparison to the previous  echocardiogram(s): Anterior/anterior septum  hypokinesis seen on previous test 11/06/2017. EF 50-55%. __________   Lexiscan  MPI 03/20/2019: Pharmacological myocardial perfusion imaging study with no significant  ischemia Normal wall motion, EF estimated at 85% No EKG changes concerning for ischemia at peak stress or in recovery. CT attenuation correction images with mild coronary calcification at bifurcation of LAD/left circumflex, moderate calcification of the descending aorta Resting EKG with sinus rhythm, left bundle branch block Low risk scan __________   [facility] 11/08/2017: Ost 1st Sept lesion is 30% stenosed. 1st Mrg lesion is 30% stenosed. Prox RCA lesion is 30% stenosed. Mid RCA lesion is 30% stenosed. The left ventricular systolic function is normal. LV end diastolic pressure is mildly elevated.   1.  Mild nonobstructive coronary artery disease. 2.  Normal LV systolic function with an EF of 55% with borderline mid anterior wall hypokinesis likely due to IVCD. 3.  Right heart catheterization showed high normal filling pressures, normal pulmonary pressure and moderately reduced cardiac output.  Pulmonary capillary wedge pressure was 11 mmHg, PA pressure was 27 over 11 mmHg, cardiac output was 3.1 with a cardiac index of 1.8.   Recommendations: Continue medical therapy for diastolic heart failure.  Volume status has improved close to normal and thus I am going to switch furosemide  to oral. __________   2D echo 11/06/2017: - Left ventricle: The cavity size was normal. Wall thickness was    normal. Systolic function was normal. The estimated ejection  fraction was in the range of 50% to 55%. There is hypokinesis of    the anteroseptal and anterior myocardium. Features are consistent    with a pseudonormal left ventricular filling pattern, with    concomitant abnormal relaxation and increased filling pressure    (grade 2 diastolic dysfunction). Doppler parameters are    consistent  with high ventricular filling pressure.  - Mitral valve: There was mild regurgitation.  - Right ventricle: The cavity size was normal. Wall thickness was    normal. Systolic function was normal.  - Pulmonary arteries: Systolic pressure was mildly increased,    estimated to be 35 mm Hg.   Impressions:   - Compared with prior echo from 12/28/15, anterior/anteroseptal    hypokinesis is new. __________   [facility] 01/12/2016: Conclusions: Mild, non-obstructive coronary artery disease.  There is slightly sluggish flow throughout the coronary arteries, which may reflect microvascular dysfunction. Upper normal to mildly elevated left ventricular filling pressure.   Recommendations Continue risk factor modification. Uptitrate metoprolol  and continue long-acting nitrate for possible component of microvascular dysfunction. Outpatient follow-up with myself. __________   2D echo 12/28/2015: - Procedure narrative: Transthoracic echocardiography. Image    quality was fair. The study was technically difficult.  - Left ventricle: The cavity size was normal. Wall thickness was    normal. Systolic function was normal. The estimated ejection    fraction was in the range of 55% to 60%. Wall motion was normal    grossly; there were no regional wall motion abnormalities. Left    ventricular diastolic function parameters were normal for the    patient&'s age. __________   Nuclear stress test 12/23/2015: There was no ST segment deviation noted during stress. Defect 1: There is a small defect of mild severity present in the apical anterior and apex location. Findings suggestive small area of ischemia. However, shifting breast atteunation artifact can not be excluded. This is a low risk study. The left ventricular ejection fraction is normal (55-65%). TID was present.   EKG:  EKG is ordered today.  The EKG ordered today demonstrates NSR, 79 bpm, LBBB  Recent Labs: 02/25/2023: BNP 37.3 08/09/2023: ALT 32;  BUN 15; Creatinine, Ser 1.50; Hemoglobin 13.2; Magnesium 1.9; Platelets 130; Potassium 4.1; Sodium 139  Recent Lipid Panel    Component Value Date/Time   CHOL 126 02/25/2023 1016   TRIG 232 (H) 02/25/2023 1016   HDL 38 (L) 02/25/2023 1016   CHOLHDL 3.3 02/25/2023 1016   CHOLHDL 3.7 05/08/2022 0914   VLDL 39 05/08/2022 0914   LDLCALC 51 02/25/2023 1016   LDLDIRECT 57 02/25/2023 1016   LDLDIRECT 104 (H) 05/08/2022 0914    PHYSICAL EXAM:    VS:  BP (!) 100/50 (BP Location: Left Arm, Patient Position: Sitting, Cuff Size: Normal)   Pulse 77   Ht 5\' 3"  (1.6 m)   Wt 145 lb (65.8 kg)   SpO2 95%   BMI 25.69 kg/m   BMI: Body mass index is 25.69 kg/m.  Physical Exam  Wt Readings from Last 3 Encounters:  08/13/23 145 lb (65.8 kg)  08/06/23 160 lb 7.9 oz (72.8 kg)  06/17/23 158 lb (71.7 kg)     ASSESSMENT & PLAN:   Nonobstructive CAD: No symptoms of angina at this time.  Continue aggressive risk factor modification including aspirin  81 mg, atorvastatin  80 mg, and ezetimibe  10 mg.  Reduce Lopressor  to 12.5 mg twice daily given relative hypotension.  HFpEF/pulmonary hypertension: Euvolemic and well compensated.  Normal RVSP by  echo earlier this month.  No longer on furosemide , would continue to hold at this time.  Defer escalation of GDMT given lack of heart failure symptoms, preserved LV systolic function, normal RVSP, and relative hypotension.  Mitral regurgitation: Moderate mitral regurgitation noted by echo earlier this month.  Repeat echo in 1 to 2 years.  PAD: Status post left common iliac artery stenting with normal ABIs and lower extremity arterial ultrasound demonstrating patent stent in 07/2022.  Anticipate scheduling of follow-up noninvasive imaging at next visit.  HLD: LDL 51 in 02/2023.  Remains on atorvastatin  80 mg and ezetimibe  10 mg.  Palpitations with generalized weakness and history of hypercalcemia: Reduce Lopressor  to 12.5 mg twice daily given generalized weakness.   Place ZIO AT to evaluate for conduction abnormality.  LBBB: Stable.  Recent echo without significant structural abnormality.    COPD with prior tobacco use: No acute exacerbation.  Quit smoking in 11/2022.  Generalized weakness with tremor and visual changes with associated hyperkalemia and AKI: Check BMP.  Has follow-up with PCP later this week.  May need to consider neurology referral, will defer to PCP.  Remains off of furosemide  and KCl.    Disposition: F/u with Dr. Nolan Battle or an APP in 6 weeks.   Medication Adjustments/Labs and Tests Ordered: Current medicines are reviewed at length with the patient today.  Concerns regarding medicines are outlined above. Medication changes, Labs and Tests ordered today are summarized above and listed in the Patient Instructions accessible in Encounters.   Signed, Varney Gentleman, PA-C 08/13/2023 1:26 PM     [facility] [place] [address] [place], [place] [address] [phone]

## 2023-08-13 ENCOUNTER — Encounter: Payer: Self-pay | Admitting: Physician Assistant

## 2023-08-13 ENCOUNTER — Ambulatory Visit: Attending: Physician Assistant | Admitting: Physician Assistant

## 2023-08-13 ENCOUNTER — Ambulatory Visit

## 2023-08-13 VITALS — BP 100/50 | HR 77 | Ht 63.0 in | Wt 145.0 lb

## 2023-08-13 DIAGNOSIS — I2510 Atherosclerotic heart disease of native coronary artery without angina pectoris: Secondary | ICD-10-CM

## 2023-08-13 DIAGNOSIS — I739 Peripheral vascular disease, unspecified: Secondary | ICD-10-CM

## 2023-08-13 DIAGNOSIS — I340 Nonrheumatic mitral (valve) insufficiency: Secondary | ICD-10-CM

## 2023-08-13 DIAGNOSIS — J449 Chronic obstructive pulmonary disease, unspecified: Secondary | ICD-10-CM

## 2023-08-13 DIAGNOSIS — N179 Acute kidney failure, unspecified: Secondary | ICD-10-CM

## 2023-08-13 DIAGNOSIS — I5032 Chronic diastolic (congestive) heart failure: Secondary | ICD-10-CM | POA: Diagnosis not present

## 2023-08-13 DIAGNOSIS — R002 Palpitations: Secondary | ICD-10-CM

## 2023-08-13 DIAGNOSIS — I272 Pulmonary hypertension, unspecified: Secondary | ICD-10-CM

## 2023-08-13 DIAGNOSIS — R531 Weakness: Secondary | ICD-10-CM

## 2023-08-13 DIAGNOSIS — I447 Left bundle-branch block, unspecified: Secondary | ICD-10-CM

## 2023-08-13 DIAGNOSIS — E785 Hyperlipidemia, unspecified: Secondary | ICD-10-CM

## 2023-08-13 MED ORDER — METOPROLOL TARTRATE 25 MG PO TABS: 12.5000 mg | ORAL_TABLET | Freq: Two times a day (BID) | ORAL | 3 refills | Status: AC
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
## 2023-08-13 NOTE — Patient Instructions (Signed)
 Medication Instructions:  Your physician recommends the following medication changes.  DECREASE: Metoprolol  12.5 mg (1/2 tablet) 2 times daily  *If you need a refill on your cardiac medications before your next appointment, please call your pharmacy*  Lab Work: Your provider would like for you to have following labs drawn today Basic Metabolic Panel.   If you have labs (blood work) drawn today and your tests are completely normal, you will receive your results only by: MyChart Message (if you have MyChart) OR A paper copy in the mail If you have any lab test that is abnormal or we need to change your treatment, we will call you to review the results.  Testing/Procedures: Your physician has recommended that you wear a Zio monitor.   This monitor is a medical device that records the heart's electrical activity. Doctors most often use these monitors to diagnose arrhythmias. Arrhythmias are problems with the speed or rhythm of the heartbeat. The monitor is a small device applied to your chest. You can wear one while you do your normal daily activities. While wearing this monitor if you have any symptoms to push the button and record what you felt. Once you have worn this monitor for the period of time provider prescribed (Usually 14 days), you will return the monitor device in the postage paid box. Once it is returned they will download the data collected and provide us  with a report which the provider will then review and we will call you with those results. Important tips:  Avoid showering during the first 24 hours of wearing the monitor. Avoid excessive sweating to help maximize wear time. Do not submerge the device, no hot tubs, and no swimming pools. Keep any lotions or oils away from the patch. After 24 hours you may shower with the patch on. Take brief showers with your back facing the shower head.  Do not remove patch once it has been placed because that will interrupt data and decrease  adhesive wear time. Push the button when you have any symptoms and write down what you were feeling. Once you have completed wearing your monitor, remove and place into box which has postage paid and place in your outgoing mailbox.  If for some reason you have misplaced your box then call our office and we can provide another box and/or mail it off for you.   Follow-Up: At [facility], you and your health needs are our priority.  As part of our continuing mission to provide you with exceptional heart care, our providers are all part of one team.  This team includes your primary Cardiologist (physician) and Advanced Practice Providers or APPs (Physician Assistants and Nurse Practitioners) who all work together to provide you with the care you need, when you need it.  Your next appointment:   6 week(s)  Provider:   Varney Gentleman, PA-C    We recommend signing up for the patient portal called "MyChart".  Sign up information is provided on this After Visit Summary.  MyChart is used to connect with patients for Virtual Visits (Telemedicine).  Patients are able to view lab/test results, encounter notes, upcoming appointments, etc.  Non-urgent messages can be sent to your provider as well.   To learn more about what you can do with MyChart, go to [email or web address].

## 2023-08-14 ENCOUNTER — Ambulatory Visit: Payer: Self-pay | Admitting: Physician Assistant

## 2023-08-14 DIAGNOSIS — Z79899 Other long term (current) drug therapy: Secondary | ICD-10-CM

## 2023-08-14 LAB — BASIC METABOLIC PANEL WITH GFR
BUN/Creatinine Ratio: 11 — ABNORMAL LOW (ref 12–28)
BUN: 12 mg/dL (ref 8–27)
CO2: 21 mmol/L (ref 20–29)
Calcium: 9.1 mg/dL (ref 8.7–10.3)
Chloride: 101 mmol/L (ref 96–106)
Creatinine, Ser: 1.11 mg/dL — ABNORMAL HIGH (ref 0.57–1.00)
Glucose: 138 mg/dL — ABNORMAL HIGH (ref 70–99)
Potassium: 3.3 mmol/L — ABNORMAL LOW (ref 3.5–5.2)
Sodium: 140 mmol/L (ref 134–144)
eGFR: 54 mL/min/{1.73_m2} — ABNORMAL LOW (ref >59)

## 2023-08-15 ENCOUNTER — Other Ambulatory Visit: Payer: Self-pay | Admitting: Emergency Medicine

## 2023-08-15 DIAGNOSIS — Z79899 Other long term (current) drug therapy: Secondary | ICD-10-CM

## 2023-08-15 MED ORDER — POTASSIUM CHLORIDE ER 10 MEQ PO TBCR: 10.0000 meq | EXTENDED_RELEASE_TABLET | Freq: Every day | ORAL | 3 refills | Status: DC

## 2023-08-16 ENCOUNTER — Encounter: Payer: Self-pay | Admitting: Family Medicine

## 2023-08-16 ENCOUNTER — Ambulatory Visit (INDEPENDENT_AMBULATORY_CARE_PROVIDER_SITE_OTHER): Admitting: Family Medicine

## 2023-08-16 ENCOUNTER — Ambulatory Visit: Payer: BC Managed Care – PPO | Admitting: Family Medicine

## 2023-08-16 DIAGNOSIS — Z87891 Personal history of nicotine dependence: Secondary | ICD-10-CM | POA: Insufficient documentation

## 2023-08-16 DIAGNOSIS — K219 Gastro-esophageal reflux disease without esophagitis: Secondary | ICD-10-CM | POA: Diagnosis not present

## 2023-08-16 DIAGNOSIS — E1151 Type 2 diabetes mellitus with diabetic peripheral angiopathy without gangrene: Secondary | ICD-10-CM | POA: Diagnosis not present

## 2023-08-16 DIAGNOSIS — Z7984 Long term (current) use of oral hypoglycemic drugs: Secondary | ICD-10-CM

## 2023-08-16 NOTE — Progress Notes (Signed)
 Established patient visit   Patient: Katelyn Cooper   DOB: [birth year]   68 y.o. Female  MRN: [id number] Visit Date: 08/16/2023  Today's healthcare provider: Jeralene Mom, MD   Chief Complaint  Patient presents with   Hospitalization Follow-up    Dx: Hypercalcemia Admit date:08/06/2023 Discharge: 08/09/2023 Patient had labs done 3 days ago   Subjective    Discussed the use of AI scribe software for clinical note transcription with the patient, who gave verbal consent to proceed.  History of Present Illness   Katelyn Cooper "Babette Bolognese" is a 68 year old female presents for follow up of 4 day hospitalization for hypercalcemia and acid reflux who presents for management of these conditions.  She reports onset of severe fatigue and illness following a trip to [place] . Her intake of a multivitamin and several individual vitamins, along with significant consumption of over-the-counter antacids for worsening acid reflux. She presented to ER on 08/06/2023 with serum calcium  of 14.2 with an otherwise generally normal workup. Hypercalcemia was presumed to be secondary to excessive intake of oral calcium  containing OTC reflux medications including. She has a history of acid reflux and has been taking more reflux medications than usual due to persistent heartburn. She frequently used chewable antacids, even keeping them by her bedside for nighttime use. 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 She previously had a GI doctor who managed her esophageal dilation, but he passed away, and she is now seeking a new specialist. During her hospital stay, she was started on sucralfate and pantoprazole . Since starting these medications, she no longer experiences heartburn and has stopped taking over-the-counter heartburn medications. She is taking pantoprazole  twice daily and sucralfate four times daily since discharge. She was also advised to stop Mounjaro upon discharge.   She has a  history of heart issues and was evaluated by her cardiologist due to concerns about her heart's involvement in calcium  regulation. She was placed on a heart monitor and had blood work done yesterday, including a CMP and GFR, to assess kidney function and calcium  levels. Her calcium  levels have since returned to normal, but her potassium was slightly low.  She has a history of diabetes and was started on Mounjaro in February and titrated to 5mg  in March, and discontinued with recent hospitalization. She is currently managing her diabetes with metformin , taking two doses daily. She has not been regularly checking her blood sugar but plans to monitor it weekly. Her A1c was checked in the hospital and was within a good range.  She quit smoking in August of the previous year. She reports dizziness and brain fog, which have improved since her calcium  levels normalized. She also experienced difficulty with balance and coordination during her recent illness.     Lab Results  Component Value Date   NA 140 08/13/2023   K 3.3 (L) 08/13/2023   CREATININE 1.11 (H) 08/13/2023   EGFR 54 (L) 08/13/2023   GLUCOSE 138 (H) 08/13/2023   Lab Results  Component Value Date   HGBA1C 6.6 (H) 08/06/2023   HGBA1C 8.0 (H) 02/25/2023   HGBA1C 6.9 (A) 10/24/2022     Medications: Outpatient Medications Prior to Visit  Medication Sig   acetaminophen  (TYLENOL ) 500 MG tablet Take 500 mg by mouth every 6 (six) hours as needed.   albuterol  (VENTOLIN  HFA) 108 (90 Base) MCG/ACT inhaler inhale 2 puffs by mouth every 6 hours if needed for wheezing or shortness of breath   atorvastatin  (LIPITOR)  80 MG tablet Take 1 tablet by mouth daily.   doxepin  (SINEQUAN ) 10 MG capsule Take 10 mg by mouth at bedtime as needed (insomnia).   ezetimibe  (ZETIA ) 10 MG tablet Take 1 tablet by mouth daily.   metFORMIN  (GLUCOPHAGE -XR) 500 MG 24 hr tablet Take 1 tablet (500 mg total) by mouth 2 (two) times daily.   metoprolol  tartrate (LOPRESSOR )  25 MG tablet Take 0.5 tablets (12.5 mg total) by mouth 2 (two) times daily.   pantoprazole  (PROTONIX ) 40 MG tablet Take 1 tablet (40 mg total) by mouth 2 (two) times daily.   potassium chloride  (KLOR-CON ) 10 MEQ tablet Take 1 tablet (10 mEq total) by mouth daily.   QUEtiapine  (SEROQUEL ) 100 MG tablet Take 1 tablet (100 mg total) by mouth at bedtime.   sucralfate (CARAFATE) 1 g tablet Take 1 tablet (1 g total) by mouth 4 (four) times daily -  with meals and at bedtime.   venlafaxine  XR (EFFEXOR -XR) 75 MG 24 hr capsule Take 1 capsule (75 mg total) by mouth daily with breakfast.   aspirin  EC 81 MG tablet Take 1 tablet (81 mg total) by mouth daily.   cyclobenzaprine  (FLEXERIL ) 10 MG tablet Take 10 mg by mouth 3 (three) times daily. (Patient not taking: Reported on 08/13/2023)   fluticasone  (FLONASE ) 50 MCG/ACT nasal spray SPRAY 2 SPRAYS INTO EACH NOSTRIL EVERY DAY (Patient not taking: No sig reported)   [Paused] furosemide  (LASIX ) 20 MG tablet Take 1 tablet by mouth every other day and may take 1 tablet by mouth  as needed for increased fluid or weight gain on days that you would normally not take a tablet. (Patient not taking: Reported on 08/13/2023)   glucose blood test strip ONETOUCH ULTRA MINI STRIPS AND LANCETS. Use one daily to check blood sugar. (Patient not taking: Reported on 08/16/2023)   No facility-administered medications prior to visit.       Objective    BP (!) 99/59 (BP Location: Left Arm, Patient Position: Sitting, Cuff Size: Normal)   Pulse 76   Temp 98.1 F (36.7 C) (Oral)   Wt 146 lb 6.4 oz (66.4 kg)   SpO2 97%   BMI 25.93 kg/m   Physical Exam   General appearance: Well developed, well nourished female, cooperative and in no acute distress Head: Normocephalic, without obvious abnormality, atraumatic Respiratory: Respirations even and unlabored, normal respiratory rate Extremities: All extremities are intact.  Skin: Skin color, texture, turgor normal. No rashes seen   Psych: Appropriate mood and affect. Neurologic: Mental status: Alert, oriented to person, place, and time, thought content appropriate.    Assessment & Plan       Hypercalcemia Recent severe hypercalcemia likely from excessive calcium -containing antacids. Calcium  levels normalized. Cardiologist monitoring cardiac effects. Blood work ordered to assess kidney function and calcium  regulation. - Follow up with cardiologist for heart monitoring and trending calcium  levels. - Review blood work results to assess kidney function.  Gastroesophageal reflux disease (GERD) Chronic GERD managed with pantoprazole  and sucralfate. Adjusted sleeping position. Informed about reducing pantoprazole  to avoid complications. - Continue pantoprazole  40 mg twice daily for one month, then reduce to once daily. - Continue sucralfate four times daily. - Schedule appointment with new GI specialist for further evaluation and management. - Refill pantoprazole  and sucralfate if GI appointment is delayed.  Dysphagia Dysphagia likely related to GERD and esophageal narrowing. Establishing care with new GI specialist for evaluation and potential esophageal dilation. - Schedule appointment with new GI specialist for evaluation and potential  esophageal dilation.  Type 2 diabetes mellitus Previously managed with Mounjaro, discontinued due to GERD concerns. Currently on metformin . Advised to monitor blood glucose levels. - Continue metformin  twice daily. - Check blood glucose at least once a week. - Monitor for blood glucose levels above mid-200s and report if consistently high. - Schedule follow-up for A1c in early August.  Dizziness Dizziness possibly related to medication changes and electrolyte imbalances. Reports improvement but occasional dizziness persists. - Report if dizziness persists or worsens.    Return in about 12 weeks (around 11/08/2023) for Diabetes.    Jeralene Mom, MD  [facility] [phone] (phone) [phone] (fax)  [facility]

## 2023-08-16 NOTE — Patient Instructions (Signed)
 [name]  Please review the attached list of medications and notify my office if there are any errors.   . Please bring all of your medications to every appointment so we can make sure that our medication list is the same as yours.

## 2023-08-18 DIAGNOSIS — R002 Palpitations: Secondary | ICD-10-CM | POA: Diagnosis not present

## 2023-08-20 ENCOUNTER — Telehealth: Payer: Self-pay | Admitting: Family Medicine

## 2023-08-20 ENCOUNTER — Other Ambulatory Visit: Payer: Self-pay

## 2023-08-20 ENCOUNTER — Ambulatory Visit (INDEPENDENT_AMBULATORY_CARE_PROVIDER_SITE_OTHER): Admitting: Psychiatry

## 2023-08-20 ENCOUNTER — Encounter: Payer: Self-pay | Admitting: Psychiatry

## 2023-08-20 VITALS — BP 109/64 | HR 83 | Temp 97.2°F | Ht 63.0 in | Wt 146.6 lb

## 2023-08-20 DIAGNOSIS — F411 Generalized anxiety disorder: Secondary | ICD-10-CM

## 2023-08-20 DIAGNOSIS — G4700 Insomnia, unspecified: Secondary | ICD-10-CM

## 2023-08-20 DIAGNOSIS — F3341 Major depressive disorder, recurrent, in partial remission: Secondary | ICD-10-CM | POA: Diagnosis not present

## 2023-08-20 MED ORDER — DOXEPIN HCL 10 MG PO CAPS: 10.0000 mg | ORAL_CAPSULE | Freq: Every evening | ORAL | 1 refills | Status: DC | PRN

## 2023-08-20 NOTE — Telephone Encounter (Signed)
 Copied from CRM [id number]. Topic: Clinical - Medical Advice >> Aug 19, 2023  4:35 PM Chuck Crater wrote: Reason for CRM: Patient states that her [facility] doctor passed away and patient wants to know if there are any good [place] doctor around [place] or around her area.

## 2023-08-21 ENCOUNTER — Other Ambulatory Visit: Payer: Self-pay

## 2023-08-21 ENCOUNTER — Telehealth: Payer: Self-pay

## 2023-08-21 DIAGNOSIS — K219 Gastro-esophageal reflux disease without esophagitis: Secondary | ICD-10-CM

## 2023-08-21 NOTE — Telephone Encounter (Signed)
 Recommend Dr. Ole Berkeley at [facility] or Dr. Corky Diener at [facility].

## 2023-08-21 NOTE — Telephone Encounter (Signed)
 Called patient no answer, states not being able to complete call. Attempted call twice, unable to leave VM.

## 2023-08-21 NOTE — Telephone Encounter (Signed)
 Copied from CRM [id number]. Topic: Clinical - Medical Advice >> Aug 19, 2023  4:35 PM Chuck Crater wrote: Reason for CRM: Patient states that her [facility] doctor passed away and patient wants to know if there are any good [place] doctor around [place] or around her area. >> Aug 21, 2023 12:13 PM Donald Frost wrote: The patient called back returning Evelyn's phone call. Please return her call to the home number listed and leave a message as she has a 1pm appt today.

## 2023-08-21 NOTE — Telephone Encounter (Signed)
 Spoke with patient, verbalized understanding. Discussed if she would like a referral placed. Patient agreed she would like one, placed referral and a referral coordinator would stay in contact.

## 2023-08-27 DIAGNOSIS — Z79899 Other long term (current) drug therapy: Secondary | ICD-10-CM | POA: Diagnosis not present

## 2023-08-28 ENCOUNTER — Ambulatory Visit: Payer: Self-pay | Admitting: Physician Assistant

## 2023-08-28 LAB — BASIC METABOLIC PANEL WITH GFR
BUN/Creatinine Ratio: 7 — ABNORMAL LOW (ref 12–28)
BUN: 7 mg/dL — ABNORMAL LOW (ref 8–27)
CO2: 22 mmol/L (ref 20–29)
Calcium: 8.3 mg/dL — ABNORMAL LOW (ref 8.7–10.3)
Chloride: 102 mmol/L (ref 96–106)
Creatinine, Ser: 0.94 mg/dL (ref 0.57–1.00)
Glucose: 161 mg/dL — ABNORMAL HIGH (ref 70–99)
Potassium: 3.2 mmol/L — ABNORMAL LOW (ref 3.5–5.2)
Sodium: 143 mmol/L (ref 134–144)
eGFR: 66 mL/min/{1.73_m2} (ref >59)

## 2023-09-03 ENCOUNTER — Encounter: Payer: Self-pay | Admitting: Family Medicine

## 2023-09-03 DIAGNOSIS — K219 Gastro-esophageal reflux disease without esophagitis: Secondary | ICD-10-CM

## 2023-09-04 MED ORDER — SUCRALFATE 1 G PO TABS: 1.0000 g | ORAL_TABLET | Freq: Three times a day (TID) | ORAL | 5 refills | Status: AC

## 2023-09-04 MED ORDER — PANTOPRAZOLE SODIUM 40 MG PO TBEC: 40.0000 mg | DELAYED_RELEASE_TABLET | Freq: Two times a day (BID) | ORAL | 5 refills | Status: DC

## 2023-09-11 ENCOUNTER — Other Ambulatory Visit: Payer: Self-pay | Admitting: Family Medicine

## 2023-09-11 DIAGNOSIS — K219 Gastro-esophageal reflux disease without esophagitis: Secondary | ICD-10-CM

## 2023-09-11 MED ORDER — PANTOPRAZOLE SODIUM 40 MG PO TBEC: 40.0000 mg | DELAYED_RELEASE_TABLET | Freq: Every day | ORAL | Status: DC

## 2023-09-11 NOTE — Progress Notes (Signed)
 Reduce to one daily per last office note.

## 2023-09-14 ENCOUNTER — Other Ambulatory Visit: Payer: Self-pay | Admitting: Psychiatry

## 2023-09-15 ENCOUNTER — Other Ambulatory Visit: Payer: Self-pay | Admitting: Family Medicine

## 2023-09-15 ENCOUNTER — Other Ambulatory Visit: Payer: Self-pay | Admitting: Internal Medicine

## 2023-09-15 DIAGNOSIS — E119 Type 2 diabetes mellitus without complications: Secondary | ICD-10-CM

## 2023-09-16 DIAGNOSIS — R002 Palpitations: Secondary | ICD-10-CM | POA: Diagnosis not present

## 2023-09-23 NOTE — Progress Notes (Unsigned)
 Cardiology Office Note    Date:  09/24/2023   ID:  Katelyn Cooper, Katelyn Cooper Dec 18, 1955, MRN [id number]  PCP:  [name], MD  Cardiologist:  Lonni Hanson, MD  Electrophysiologist:  None   Chief Complaint: Follow-up  History of Present Illness:   Katelyn Cooper is a 68 y.o. female with history of nonobstructive CAD, PAD status post left iliac stenting, HFpEF, mild to moderate mitral regurgitation, vasovagal syncope, DM2, thoracic outlet syndrome status post bilateral rib resections in 1990, HLD, COPD with ongoing tobacco use, GERD complicated by esophageal stricture, OSA on CPAP, cervical radiculopathy, and anxiety/depression who presents for follow-up of CAD and PAD.  LHC from 01/2016 demonstrated 40% stenosis in the proximal aspect of a large first septal perforator along with 30% OM1 stenosis and 30% proximal and mid RCA stenoses.  R/LHC in 10/2017 showed 30% stenosis at the first septal perforator, OM1 30% stenosis, sequential 30% proximal and mid RCA stenoses, upper normal filling pressures and normal pulmonary artery pressure with a moderately reduced cardiac output as outlined below.  Lexiscan  MPI from 03/2019 was low risk without evidence of ischemia or scar coronary artery calcification noted.  LVEF greater than 65%.  Echo from 04/2019 showed an EF of 60 to 65%, grade 1 diastolic dysfunction with elevated filling pressures, normal RV size and systolic function, no significant valvular abnormality, and normal CVP.   She was evaluated for syncope in 05/2020 that occurred after stubbing her toe with associated severe pain.  Outpatient cardiac monitor in 05/2020 showed a predominant rhythm of sinus with 5 runs of PSVT with the longest episode lasting 18.8 seconds as well as rare PACs and PVCs.  Episode of syncope was felt to be precipitated by vasovagal episode complicated by orthostatic hypotension in the setting of dehydration.   She was admitted to the hospital in 09/2021 with lower  extremity weakness with noted decreased oral intake and dysphagia following a cervical fusion in the context of cervical collar.  High-sensitivity troponin negative x2.  Blood cultures negative.  CBC consistent with hemoconcentration.  She was evaluated by orthopedics and PT with recommendation for home health PT.   She was seen in the office in 10/2021 and reported several episodes of profound lower extremity weakness leading up to her hospital admission, which was felt to be noncardiac in etiology.  Echo in 12/2021 showed an EF of 55 to 60%, septal wall hypokinesis consistent with known left bundle branch block, normal LV diastolic function parameters, normal RV systolic function and ventricular cavity size, mildly elevated PASP estimated at 36.4 mmHg, mild to moderate mitral regurgitation, and an estimated right atrial pressure of 3 mmHg.  Zio patch at that time showed a predominant rhythm of sinus with an average rate of 85 bpm (range 66 to 132 bpm), a single atrial run lasting 5 beats was noted, rare PACs and PVCs, no sustained arrhythmias or prolonged pauses.  No patient triggered events.  She was seen in the office in 05/2022 and noted bilateral thigh discomfort and weakness.  She continued to smoke 1 pack/day.  Subsequent ABIs in 07/2022 were normal and the previously placed left common iliac artery stent was patent.   She was admitted to the hospital in 08/2023 with generalized weakness after returning from [place]  with associated achiness and emesis.  She was found to have AKI with a serum creatinine of 1.91 and was hypercalcemic with a calcium  of 14.2.  CT showed no acute intracranial pathology.  CT of the  abdomen/pelvis was compatible with esophagitis and with a distended segment.  Echo showed an EF of 55 to 60%, basal to mid septal wall hypokinesis, grade 1 diastolic dysfunction, normal RV systolic function, ventricular cavity size, and RVSP, moderate mitral valve regurgitation, and an estimated right  atrial pressure of 3 mmHg.  She had symptomatic improvement in AKI with the holding of furosemide  and IV fluids.  She was last seen in the office on 08/13/2023 and continued to note generalized malaise and fatigue following her hospitalization.  She also had 2 near falls versus near syncopal episodes.  Zio patch in 08/2023 showed a predominant rhythm of sinus with 5 episodes of SVT lasting up to 10 beats, and rare PACs and PVCs.  Patient triggered event corresponded to sinus tachycardia.  She comes in doing well from a cardiac perspective and is without symptoms of angina or cardiac decompensation.  No palpitations, dyspnea, near-syncope, or syncope.  Overall, feels much better when compared to her last visit and feels back to baseline.  She is now back to walking 2 miles 3 days/week without cardiac limitation or symptoms of claudication.  Has not needed any as needed furosemide .  No falls or symptoms concerning for bleeding.  No progressive orthopnea.  Overall feels well at this time and does not have any acute cardiac concerns currently.   Labs independently reviewed: 08/2023 - BUN 7, serum creatinine 0.94, potassium 3.2, calcium  8.3, albumin 3.6, AST/ALT normal, Hgb 13.2, PLT 130, magnesium 1.9, A1c 6.6 02/2023 - BNP 37, TC 126, TG 232, HDL 38, LDL 51 09/2021 - TSH normal  Past Medical History:  Diagnosis Date   (HFpEF) heart failure with preserved ejection fraction (HCC)    a. 12/2015 Echo: EF 55-60%, nl RV size/fxn, no significant valvular abnormalities; b. 10/2017 Echo: EF 50-55%, antsept, ant HK. Gr2 DD. Mild MR. Nl RV fxn. PASP .   Actinic keratosis    Anxiety    Basal cell carcinoma 08/08/2017   L nose above mid nasal alar groove   CHF (congestive heart failure) (HCC)    Depression    Diabetes type 2, controlled (HCC)    diet-controlled   Esophageal stricture    GERD (gastroesophageal reflux disease)    Hiatal hernia    History of chicken pox    Hyperlipidemia    Hypertension     Internal hemorrhoids    Non-obstructive CAD (coronary artery disease)    a. 12/2015 MV: apical ant and apical reversible defect. EF 55-65%; b. 01/2016 Cath: LM nl, LAD nl, SP1 40ost, LCX nl, OM1 30, RCA 30p/m, EDP 15-48mmHg; c. 10/2017 Cath: LM nl, LAD nl, SP1 30ost, LCX nl, OM1 30, RCA 30p/m. EF 55%.   Obstructive sleep apnea    PAD (peripheral artery disease) (HCC)    a. 1990's s/p prior LCIA stenting; b. 12/2015 ABI: R 1.05, L 1.03.   Panic disorder     Past Surgical History:  Procedure Laterality Date   AORTA SURGERY  1999   stent placement; ? left iliac artery intervention   APPENDECTOMY  1998   BASAL CELL CARCINOMA EXCISION     C5 - fusion N/A 10/10/2021   CARDIAC CATHETERIZATION N/A 01/12/2016   Procedure: Left Heart Cath and Coronary Angiography;  Surgeon: Lonni Hanson, MD;  Location: [facility] INVASIVE CV LAB;  Service: Cardiovascular;  Laterality: N/A;   COLONOSCOPY  07/17/2019   FOOT SURGERY     Right   pap smear  03/2010   Done by Dr. Jon Rummer,  normal   REFRACTIVE SURGERY     right   RIB RESECTION  8009,8006   RIGHT/LEFT HEART CATH AND CORONARY ANGIOGRAPHY N/A 11/08/2017   Procedure: RIGHT/LEFT HEART CATH AND CORONARY ANGIOGRAPHY;  Surgeon: Darron Deatrice LABOR, MD;  Location: [facility] INVASIVE CV LAB;  Service: Cardiovascular;  Laterality: N/A;   SHOULDER SURGERY  2005   left   UPPER GASTROINTESTINAL ENDOSCOPY  07/17/2019   UPPER GI ENDOSCOPY  09/19/2011   Stricture at GE junction, dilated. Mild gastritis and duodenitis. Small hiatal hernia    Current Medications: Current Meds  Medication Sig   acetaminophen  (TYLENOL ) 500 MG tablet Take 500 mg by mouth every 6 (six) hours as needed.   albuterol  (VENTOLIN  HFA) 108 (90 Base) MCG/ACT inhaler inhale 2 puffs by mouth every 6 hours if needed for wheezing or shortness of breath   atorvastatin  (LIPITOR) 80 MG tablet Take 1 tablet by mouth daily.   doxepin  (SINEQUAN ) 10 MG capsule Take 1 capsule (10 mg total) by mouth at  bedtime as needed (insomnia).   ezetimibe  (ZETIA ) 10 MG tablet Take 1 tablet by mouth daily.   fluticasone  (FLONASE ) 50 MCG/ACT nasal spray SPRAY 2 SPRAYS INTO EACH NOSTRIL EVERY DAY   glucose blood test strip ONETOUCH ULTRA MINI STRIPS AND LANCETS. Use one daily to check blood sugar.   metFORMIN  (GLUCOPHAGE -XR) 500 MG 24 hr tablet Take 1 tablet by mouth twice daily.   metoprolol  tartrate (LOPRESSOR ) 25 MG tablet Take 0.5 tablets (12.5 mg total) by mouth 2 (two) times daily.   pantoprazole  (PROTONIX ) 40 MG tablet Take 1 tablet (40 mg total) by mouth daily.   potassium chloride  (KLOR-CON ) 10 MEQ tablet Take 1 tablet (10 mEq total) by mouth daily.   QUEtiapine  (SEROQUEL ) 100 MG tablet Take 1 tablet (100 mg total) by mouth at bedtime.   sucralfate  (CARAFATE ) 1 g tablet Take 1 tablet (1 g total) by mouth 4 (four) times daily -  with meals and at bedtime.   venlafaxine  XR (EFFEXOR -XR) 75 MG 24 hr capsule Take 1 capsule (75 mg total) by mouth daily with breakfast.    Allergies:   Lovastatin, Paroxetine, Paroxetine hcl, and Lamotrigine   Social History   Socioeconomic History   Marital status: Married    Spouse name: mark   Number of children: 0   Years of education: Not on file   Highest education level: High school graduate  Occupational History   Occupation: computer-retired    Employer: [employer]  Tobacco Use   Smoking status: Former    Current packs/day: 0.00    Types: Cigarettes    Quit date: 11/16/2022    Years since quitting: 0.8   Smokeless tobacco: Never  Vaping Use   Vaping status: Former  Substance and Sexual Activity   Alcohol use: No    Alcohol/week: 0.0 standard drinks of alcohol   Drug use: No   Sexual activity: Not Currently  Other Topics Concern   Not on file  Social History Narrative   Not on file   Social Drivers of Health   Financial Resource Strain: Low Risk  (12/12/2021)   Overall Financial Resource Strain (CARDIA)    Difficulty of Paying Living Expenses:  Not hard at all  Food Insecurity: No Food Insecurity (08/07/2023)   Hunger Vital Sign    Worried About Running Out of Food in the Last Year: Never true    Ran Out of Food in the Last Year: Never true  Transportation Needs: No Transportation Needs (08/07/2023)  PRAPARE - Administrator, Civil Service (Medical): No    Lack of Transportation (Non-Medical): No  Physical Activity: Inactive (12/12/2021)   Exercise Vital Sign    Days of Exercise per Week: 0 days    Minutes of Exercise per Session: 0 min  Stress: No Stress Concern Present (12/12/2021)   [employer] of Occupational Health - Occupational Stress Questionnaire    Feeling of Stress : Only a little  Social Connections: Socially Isolated (08/07/2023)   Social Connection and Isolation Panel    Frequency of Communication with Friends and Family: Once a week    Frequency of Social Gatherings with Friends and Family: Never    Attends Religious Services: Never    Diplomatic Services operational officer: No    Attends Engineer, structural: Never    Marital Status: Married     Family History:  The patient's family history includes Alcohol abuse in her father, mother, and another family member; Bipolar disorder in her sister and another family member; Breast cancer in her sister; Cancer in her mother; Depression in her mother and another family member; Drug abuse in her sister; Emphysema in her mother and sister; Esophageal cancer in her maternal aunt; Heart disease in her maternal grandmother, mother, and another family member; Lung cancer in her mother; Mood Disorder in her father; Stroke in her maternal grandmother; Vision loss in an other family member. There is no history of Colon cancer, Colon polyps, Rectal cancer, or Stomach cancer.  ROS:   12-point review of systems is negative unless otherwise noted in the HPI.   EKGs/Labs/Other Studies Reviewed:    Studies reviewed were summarized above. The additional  studies were reviewed today:  Zio patch 08/2023:   The patient was monitored for 13 days, 23 hours.   The predominant rhythm was sinus with an average rate of 81 bpm (range 62-130 bpm in sinus).   There were rare PACs and PVCs.   Five supraventricular runs were observed, lasting up to 10 beats with a maximum rate of 162 bpm.   No sustained arrhythmia or prolonged pause was seen.   Patient triggered event corresponded to sinus tachycardia.   Predominantly sinus rhythm with rare PACs and PVCs as well as a few brief supraventricular runs. __________  2D echo 08/07/2023: 1. Left ventricular ejection fraction, by estimation, is 55 to 60%. Left  ventricular ejection fraction by PLAX is 58 %. The left ventricle has  normal function. The left ventricle demonstrates regional wall motion  abnormalities (basal to mid septal wall  hypokinesis). Left ventricular diastolic parameters are consistent with  Grade I diastolic dysfunction (impaired relaxation).   2. Right ventricular systolic function is normal. The right ventricular  size is normal. There is normal pulmonary artery systolic pressure. The  estimated right ventricular systolic pressure is 27.1 mmHg.   3. The mitral valve is normal in structure. Moderate mitral valve  regurgitation. No evidence of mitral stenosis.   4. The aortic valve is tricuspid. Aortic valve regurgitation is not  visualized. No aortic stenosis is present.   5. The inferior vena cava is normal in size with greater than 50%  respiratory variability, suggesting right atrial pressure of 3 mmHg.  __________   2D echo 12/01/2021: 1. Left ventricular ejection fraction, by estimation, is 55 to 60%. The  left ventricle has normal function. Septal wall hypokinesis consistent  with bundle branch block. Left ventricular diastolic parameters were  normal.   2. Right ventricular  systolic function is normal. The right ventricular  size is normal. There is mildly elevated pulmonary  artery systolic  pressure. The estimated right ventricular systolic pressure is 36.4 mmHg.   3. The mitral valve is normal in structure. Mild to moderate mitral valve  regurgitation. No evidence of mitral stenosis.   4. The aortic valve is normal in structure. Aortic valve regurgitation is  not visualized. No aortic stenosis is present.   5. The inferior vena cava is normal in size with greater than 50%  respiratory variability, suggesting right atrial pressure of 3 mmHg.  __________   Zio patch 10/2021:   The patient was monitored for 13 days, 14 hours.   The predominant rhythm was sinus with an average rate of 85 bpm (range 66-132 bpm).   There were rare PACs and PVCs.  A single atrial run lasting 5 beats occurred, with a maximum rate of 111 bpm.   No sustained arrhythmia or prolonged pause was identified.   There were no patient triggered events.   Predominantly sinus rhythm with rare PACs and PVCs as well as a single brief supraventricular run.  No significant arrhythmia observed. __________   Zio patch 05/2020: The patient was monitored for 13 days, 19 hours. The predominant rhythm was sinus with an average rate of 84 bpm (range 65-143 bpm in sinus). There were rare PAC's and PVC's. Five atrial runs lasting up to 18.8 seconds occurred with a maximum rate of 156 bpm. No sustained arrhythmia or prolonged pause was identified. There were no patient triggered events.   Predominantly sinus rhythm with a few episodes of PSVT as well as rare PAC's and PVC's. __________   2D echo 04/14/2019: 1. Left ventricular ejection fraction, by visual estimation, is 60 to  65%. The left ventricle has normal function. There is no left ventricular  hypertrophy.   2. Elevated left atrial pressure.   3. Left ventricular diastolic parameters are consistent with Grade I  diastolic dysfunction (impaired relaxation).   4. The left ventricle has no regional wall motion abnormalities.   5. Global right  ventricle has normal systolic function.The right  ventricular size is normal. No increase in right ventricular wall  thickness.   6. Left atrial size was normal.   7. Right atrial size was normal.   8. The mitral valve is normal in structure. Trivial mitral valve  regurgitation. No evidence of mitral stenosis.   9. The tricuspid valve is not well visualized.  10. The aortic valve was not well visualized. Aortic valve regurgitation  is not visualized. No evidence of aortic valve sclerosis or stenosis.  11. The pulmonic valve was not well visualized. Pulmonic valve  regurgitation is not visualized.  12. TR signal is inadequate for assessing pulmonary artery systolic  pressure.  13. The inferior vena cava is normal in size with greater than 50%  respiratory variability, suggesting right atrial pressure of 3 mmHg.  14. The interatrial septum was not well visualized.   In comparison to the previous echocardiogram(s): Anterior/anterior septum  hypokinesis seen on previous test 11/06/2017. EF 50-55%. __________   Lexiscan  MPI 03/20/2019: Pharmacological myocardial perfusion imaging study with no significant  ischemia Normal wall motion, EF estimated at 85% No EKG changes concerning for ischemia at peak stress or in recovery. CT attenuation correction images with mild coronary calcification at bifurcation of LAD/left circumflex, moderate calcification of the descending aorta Resting EKG with sinus rhythm, left bundle branch block Low risk scan __________   [facility] 11/08/2017:  Ost 1st Sept lesion is 30% stenosed. 1st Mrg lesion is 30% stenosed. Prox RCA lesion is 30% stenosed. Mid RCA lesion is 30% stenosed. The left ventricular systolic function is normal. LV end diastolic pressure is mildly elevated.   1.  Mild nonobstructive coronary artery disease. 2.  Normal LV systolic function with an EF of 55% with borderline mid anterior wall hypokinesis likely due to IVCD. 3.  Right heart  catheterization showed high normal filling pressures, normal pulmonary pressure and moderately reduced cardiac output.  Pulmonary capillary wedge pressure was 11 mmHg, PA pressure was 27 over 11 mmHg, cardiac output was 3.1 with a cardiac index of 1.8.   Recommendations: Continue medical therapy for diastolic heart failure.  Volume status has improved close to normal and thus I am going to switch furosemide  to oral. __________   2D echo 11/06/2017: - Left ventricle: The cavity size was normal. Wall thickness was    normal. Systolic function was normal. The estimated ejection    fraction was in the range of 50% to 55%. There is hypokinesis of    the anteroseptal and anterior myocardium. Features are consistent    with a pseudonormal left ventricular filling pattern, with    concomitant abnormal relaxation and increased filling pressure    (grade 2 diastolic dysfunction). Doppler parameters are    consistent with high ventricular filling pressure.  - Mitral valve: There was mild regurgitation.  - Right ventricle: The cavity size was normal. Wall thickness was    normal. Systolic function was normal.  - Pulmonary arteries: Systolic pressure was mildly increased,    estimated to be 35 mm Hg.   Impressions:   - Compared with prior echo from 12/28/15, anterior/anteroseptal    hypokinesis is new. __________   [facility] 01/12/2016: Conclusions: Mild, non-obstructive coronary artery disease.  There is slightly sluggish flow throughout the coronary arteries, which may reflect microvascular dysfunction. Upper normal to mildly elevated left ventricular filling pressure.   Recommendations Continue risk factor modification. Uptitrate metoprolol  and continue long-acting nitrate for possible component of microvascular dysfunction. Outpatient follow-up with myself. __________   2D echo 12/28/2015: - Procedure narrative: Transthoracic echocardiography. Image    quality was fair. The study was technically  difficult.  - Left ventricle: The cavity size was normal. Wall thickness was    normal. Systolic function was normal. The estimated ejection    fraction was in the range of 55% to 60%. Wall motion was normal    grossly; there were no regional wall motion abnormalities. Left    ventricular diastolic function parameters were normal for the    patient&'s age. __________   Nuclear stress test 12/23/2015: There was no ST segment deviation noted during stress. Defect 1: There is a small defect of mild severity present in the apical anterior and apex location. Findings suggestive small area of ischemia. However, shifting breast atteunation artifact can not be excluded. This is a low risk study. The left ventricular ejection fraction is normal (55-65%). TID was present.   EKG:  EKG is not ordered today.    Recent Labs: 02/25/2023: BNP 37.3 08/09/2023: ALT 32; Hemoglobin 13.2; Magnesium 1.9; Platelets 130 08/27/2023: BUN 7; Creatinine, Ser 0.94; Potassium 3.2; Sodium 143  Recent Lipid Panel    Component Value Date/Time   CHOL 126 02/25/2023 1016   TRIG 232 (H) 02/25/2023 1016   HDL 38 (L) 02/25/2023 1016   CHOLHDL 3.3 02/25/2023 1016   CHOLHDL 3.7 05/08/2022 0914   VLDL 39 05/08/2022 0914  LDLCALC 51 02/25/2023 1016   LDLDIRECT 57 02/25/2023 1016   LDLDIRECT 104 (H) 05/08/2022 0914    PHYSICAL EXAM:    VS:  BP 138/68 (BP Location: Left Arm, Patient Position: Sitting, Cuff Size: Normal)   Pulse 99   Resp 18   Ht 5' 3 (1.6 m)   Wt 150 lb 6.4 oz (68.2 kg)   SpO2 96%   BMI 26.64 kg/m   BMI: Body mass index is 26.64 kg/m.  Physical Exam Vitals reviewed.  Constitutional:      Appearance: She is well-developed.  HENT:     Head: Normocephalic and atraumatic.   Eyes:     General:        Right eye: No discharge.        Left eye: No discharge.    Cardiovascular:     Rate and Rhythm: Normal rate and regular rhythm.     Pulses:          Posterior tibial pulses are 2+ on the  right side and 2+ on the left side.     Heart sounds: S1 normal and S2 normal. Heart sounds not distant. No midsystolic click and no opening snap. Murmur heard.     Systolic murmur is present with a grade of 2/6 at the lower left sternal border.     No friction rub.  Pulmonary:     Effort: Pulmonary effort is normal. No respiratory distress.     Breath sounds: Normal breath sounds. No decreased breath sounds, wheezing, rhonchi or rales.  Chest:     Chest wall: No tenderness.   Musculoskeletal:     Cervical back: Normal range of motion.     Right lower leg: No edema.     Left lower leg: No edema.   Skin:    General: Skin is warm and dry.     Nails: There is no clubbing.   Neurological:     Mental Status: She is alert and oriented to person, place, and time.   Psychiatric:        Speech: Speech normal.        Behavior: Behavior normal.        Thought Content: Thought content normal.        Judgment: Judgment normal.     Wt Readings from Last 3 Encounters:  09/24/23 150 lb 6.4 oz (68.2 kg)  08/16/23 146 lb 6.4 oz (66.4 kg)  08/13/23 145 lb (65.8 kg)     ASSESSMENT & PLAN:   Nonobstructive CAD: No symptoms of angina.  Continue aggressive risk factor modification including aspirin  81 mg, atorvastatin  80 mg, ezetimibe  10 mg, and Lopressor  12.5 mg twice daily.  No indication for further ischemic testing at this time.  HFpEF/pulmonary hypertension: Euvolemic and well compensated.  Normal RVSP by echo in 08/2023.  Has not needed any as needed furosemide .  Defer escalation of GDMT given lack of heart failure symptoms, preserved LV systolic function, and normal RVSP.  Mitral regurgitation: Moderate by echo in 08/2023.  Repeat echo in 1 to 2 years.  PAD: Status post left common iliac artery stenting with normal ABIs and lower extremity arterial ultrasound demonstrating patent stent in 07/2022.  Remains on aspirin  and statin as outlined below.  Continue with walking regimen.  Schedule  ABIs and aortoiliac ultrasound.  HLD: LDL 51 in 02/2023.  Remains on atorvastatin  80 mg and ezetimibe  10 mg.  PSVT: Quiescent.  Remains on Lopressor  12.5 mg twice daily.  LBBB: Stable on recent  EKG.  Recent echo without significant structural abnormality.  No symptoms of syncope.  No further cardiac testing indicated at this time.  COPD with prior tobacco use: No acute exacerbation.  Quit tobacco in 11/2022.  Hypokalemia: Currently on KCl 10 mEq daily.  Check BMP.      Disposition: F/u with Dr. Mady or an APP in 6 months.   Medication Adjustments/Labs and Tests Ordered: Current medicines are reviewed at length with the patient today.  Concerns regarding medicines are outlined above. Medication changes, Labs and Tests ordered today are summarized above and listed in the Patient Instructions accessible in Encounters.   Signed, Bernardino Bring, PA-C 09/24/2023 10:19 AM     [facility] [address] [place], [place] [address] [phone]

## 2023-09-24 ENCOUNTER — Encounter: Payer: Self-pay | Admitting: Physician Assistant

## 2023-09-24 ENCOUNTER — Ambulatory Visit: Attending: Physician Assistant | Admitting: Physician Assistant

## 2023-09-24 VITALS — BP 138/68 | HR 99 | Resp 18 | Ht 63.0 in | Wt 150.4 lb

## 2023-09-24 DIAGNOSIS — Z79899 Other long term (current) drug therapy: Secondary | ICD-10-CM

## 2023-09-24 DIAGNOSIS — E785 Hyperlipidemia, unspecified: Secondary | ICD-10-CM

## 2023-09-24 DIAGNOSIS — J449 Chronic obstructive pulmonary disease, unspecified: Secondary | ICD-10-CM

## 2023-09-24 DIAGNOSIS — I5032 Chronic diastolic (congestive) heart failure: Secondary | ICD-10-CM

## 2023-09-24 DIAGNOSIS — I272 Pulmonary hypertension, unspecified: Secondary | ICD-10-CM | POA: Diagnosis not present

## 2023-09-24 DIAGNOSIS — E876 Hypokalemia: Secondary | ICD-10-CM

## 2023-09-24 DIAGNOSIS — I739 Peripheral vascular disease, unspecified: Secondary | ICD-10-CM

## 2023-09-24 DIAGNOSIS — I2510 Atherosclerotic heart disease of native coronary artery without angina pectoris: Secondary | ICD-10-CM | POA: Diagnosis not present

## 2023-09-24 DIAGNOSIS — I340 Nonrheumatic mitral (valve) insufficiency: Secondary | ICD-10-CM | POA: Diagnosis not present

## 2023-09-24 DIAGNOSIS — I447 Left bundle-branch block, unspecified: Secondary | ICD-10-CM

## 2023-09-24 DIAGNOSIS — I471 Supraventricular tachycardia, unspecified: Secondary | ICD-10-CM

## 2023-09-24 NOTE — Patient Instructions (Signed)
 Medication Instructions:  Your physician recommends that you continue on your current medications as directed. Please refer to the Current Medication list given to you today.   *If you need a refill on your cardiac medications before your next appointment, please call your pharmacy*  Lab Work: Your provider would like for you to have following labs drawn today BMeT.   If you have labs (blood work) drawn today and your tests are completely normal, you will receive your results only by: MyChart Message (if you have MyChart) OR A paper copy in the mail If you have any lab test that is abnormal or we need to change your treatment, we will call you to review the results.  Testing/Procedures: Your physician has requested that you have an ankle brachial index (ABI). During this test an ultrasound and blood pressure cuff are used to evaluate the arteries that supply the arms and legs with blood.  Allow thirty minutes for this exam.  There are no restrictions or special instructions.  This will take place at [address] [facility] [address], [place] [address]   Your physician has recommended that you have an AORTA/ILIAC Duplex. This is a noninvasive diagnostic test that uses ultrasound technology to look at the aorta and iliac arteries to detect any restriction to blood flow to the buttocks, groin, legs or feet.  No food after 11PM the night before.  Water is OK. (Don't drink liquids if you have been instructed not to for ANOTHER test). Avoid foods that produce bowel gas, for 24 hours prior to exam (see below). No breakfast, no chewing gum, no smoking or carbonated beverages. Patient may take morning medications with water. Come in for test at least 15 minutes early to register. This will take approximately 60 minutes This will take place at [address] [facility] [address], [place] [address]  Please note: We ask at that you not bring children with you during  ultrasound (echo/ vascular) testing. Due to room size and safety concerns, children are not allowed in the ultrasound rooms during exams. Our front office staff cannot provide observation of children in our lobby area while testing is being conducted. An adult accompanying a patient to their appointment will only be allowed in the ultrasound room at the discretion of the ultrasound technician under special circumstances. We apologize for any inconvenience.   Follow-Up: At [facility], you and your health needs are our priority.  As part of our continuing mission to provide you with exceptional heart care, our providers are all part of one team.  This team includes your primary Cardiologist (physician) and Advanced Practice Providers or APPs (Physician Assistants and Nurse Practitioners) who all work together to provide you with the care you need, when you need it.  Your next appointment:   6 month(s)  Provider:   You may see Lonni Hanson, MD or Bernardino Bring, PA-C  We recommend signing up for the patient portal called MyChart.  Sign up information is provided on this After Visit Summary.  MyChart is used to connect with patients for Virtual Visits (Telemedicine).  Patients are able to view lab/test results, encounter notes, upcoming appointments, etc.  Non-urgent messages can be sent to your provider as well.   To learn more about what you can do with MyChart, go to [email or web address].

## 2023-09-25 ENCOUNTER — Ambulatory Visit (INDEPENDENT_AMBULATORY_CARE_PROVIDER_SITE_OTHER): Payer: BC Managed Care – PPO | Admitting: Internal Medicine

## 2023-09-25 ENCOUNTER — Encounter: Payer: Self-pay | Admitting: Internal Medicine

## 2023-09-25 VITALS — BP 108/60 | HR 83 | Temp 98.3°F | Ht 63.0 in | Wt 150.2 lb

## 2023-09-25 DIAGNOSIS — G4733 Obstructive sleep apnea (adult) (pediatric): Secondary | ICD-10-CM

## 2023-09-25 DIAGNOSIS — J432 Centrilobular emphysema: Secondary | ICD-10-CM

## 2023-09-25 NOTE — Patient Instructions (Signed)
 Excellent Job A+ G[employer]AR!!  Continue CPAP as prescribed  Patient Instructions Continue to use CPAP every night, minimum of 4-6 hours a night.  Change equipment every 30 days or as directed by DME.  Wash your tubing with warm soap and water daily, hang to dry. Wash humidifier portion weekly. Use bottled, distilled water and change daily   Be aware of reduced alertness and do not drive or operate heavy machinery if experiencing this or drowsiness.  Exercise encouraged, as tolerated. Encouraged proper weight management.  Important to get eight or more hours of sleep  Limiting the use of the computer and television before bedtime.  Decrease naps during the day, so night time sleep will become enhanced.  Limit caffeine, and sleep deprivation.    Avoid Allergens and Irritants Avoid secondhand smoke Avoid SICK contacts Recommend  Masking  when appropriate Recommend Keep up-to-date with vaccinations  Continue Albuterol  as needed

## 2023-09-25 NOTE — Progress Notes (Signed)
 [facility] Consultation      Date: 09/25/2023,   MRN# [id number] Katelyn Cooper [facility] 1956/01/20   CT chest 12/2015 Left Lung nodule approx 6 MM-no change from previous CT scans  CT chest 3.7.19 The left lower lobe nodule has not increased in size significantly from previous CAT scan  CT chest 05/2019 Stable 5 mm left lung nodule, consistent with benign perifissural lymph node. No active lung disease.  ECHO 8/19 left ventricular filling pattern, with   concomitant abnormal relaxation and increased filling pressure   (grade 2 diastolic dysfunction).  HST 2017 AHI of 12   Pulmonary function test reviewed in detail with patient today Ratio less than 80% with FEV1 68% predicted DLCO WNL, No significant BD response Spirometry loops have some slight concavity to exp limb Minimal obstructive airways disease   CC Follow-up assessment COPD Follow-up assessment for OSA Follow up CT chest low-dose CT lung cancer screening   HPI Assessment of COPD No exacerbation at this time No evidence of heart failure at this time No evidence or signs of infection at this time No respiratory distress No fevers, chills, nausea, vomiting, diarrhea No evidence of lower extremity edema No evidence hemoptysis   Assessment of OSA Discussed sleep data and reviewed with patient.  Encouraged proper weight management.  Discussed driving precautions and its relationship with hypersomnolence.  Discussed sleep hygiene, and benefits of a fixed sleep waked time.  The importance of getting eight or more hours of sleep discussed with patient.  Discussed limiting the use of the computer and television before bedtime.  Decrease naps during the day, so night time sleep will become enhanced.  Limit caffeine, and sleep deprivation.   Patient uses and benefits from therapy Using CPAP nightly and with naps Pressure setting is comfortable and is sleeping well.  Excellent compliance report  reviewed in detail with patient 100% for days and greater than 4 hours AHI reduced to 2 CPAP of 11 Excellent control of OSA    Assessment of LDCT 06/2023 CT chest lung cancer screening follow-up MARCH 2025 Stable 5 mm lung nodule Continue protocol as scheduled CT chest  Independently Reviewed By Me Today 09/25/2023 Findings reviewed in detail with patient       Current Medication:   Current Outpatient Medications:    acetaminophen  (TYLENOL ) 500 MG tablet, Take 500 mg by mouth every 6 (six) hours as needed., Disp: , Rfl:    albuterol  (VENTOLIN  HFA) 108 (90 Base) MCG/ACT inhaler, inhale 2 puffs by mouth every 6 hours if needed for wheezing or shortness of breath, Disp: 18 g, Rfl: 6   aspirin  EC 81 MG tablet, Take 1 tablet (81 mg total) by mouth daily., Disp: , Rfl:    atorvastatin  (LIPITOR) 80 MG tablet, Take 1 tablet by mouth daily., Disp: 90 tablet, Rfl: 3   cyclobenzaprine  (FLEXERIL ) 10 MG tablet, Take 10 mg by mouth 3 (three) times daily., Disp: , Rfl:    doxepin  (SINEQUAN ) 10 MG capsule, Take 1 capsule (10 mg total) by mouth at bedtime as needed (insomnia)., Disp: 90 capsule, Rfl: 0   ezetimibe  (ZETIA ) 10 MG tablet, Take 1 tablet by mouth daily., Disp: 90 tablet, Rfl: 3   fluticasone  (FLONASE ) 50 MCG/ACT nasal spray, SPRAY 2 SPRAYS INTO EACH NOSTRIL EVERY DAY, Disp: 48 mL, Rfl: 1   [Paused] furosemide  (LASIX ) 20 MG tablet, Take 1 tablet by mouth every other day and may take 1 tablet by mouth  as needed for increased fluid or weight  gain on days that you would normally not take a tablet., Disp: 90 tablet, Rfl: 2   glucose blood test strip, ONETOUCH ULTRA MINI STRIPS AND LANCETS. Use one daily to check blood sugar., Disp: 100 each, Rfl: 3   metFORMIN  (GLUCOPHAGE -XR) 500 MG 24 hr tablet, Take 1 tablet by mouth twice daily., Disp: 180 tablet, Rfl: 4   metoprolol  tartrate (LOPRESSOR ) 25 MG tablet, Take 0.5 tablets (12.5 mg total) by mouth 2 (two) times daily., Disp: 90 tablet, Rfl: 3    pantoprazole  (PROTONIX ) 40 MG tablet, Take 1 tablet (40 mg total) by mouth daily., Disp: , Rfl:    potassium chloride  (KLOR-CON ) 10 MEQ tablet, Take 1 tablet (10 mEq total) by mouth daily., Disp: 90 tablet, Rfl: 3   QUEtiapine  (SEROQUEL ) 100 MG tablet, Take 1 tablet (100 mg total) by mouth at bedtime., Disp: 90 tablet, Rfl: 1   sucralfate  (CARAFATE ) 1 g tablet, Take 1 tablet (1 g total) by mouth 4 (four) times daily -  with meals and at bedtime., Disp: 120 tablet, Rfl: 5   venlafaxine  XR (EFFEXOR -XR) 75 MG 24 hr capsule, Take 1 capsule (75 mg total) by mouth daily with breakfast., Disp: 90 capsule, Rfl: 0     ALLERGIES   Lovastatin, Paroxetine, Paroxetine hcl, and Lamotrigine  BP 108/60 (BP Location: Right Arm, Patient Position: Sitting, Cuff Size: Large)   Pulse 83   Temp 98.3 F (36.8 C) (Oral)   Ht 5' 3 (1.6 m)   Wt 150 lb 3.2 oz (68.1 kg)   SpO2 95%   BMI 26.61 kg/m    Review of Systems: Gen:  Denies  fever, sweats, chills weight loss  HEENT: Denies blurred vision, double vision, ear pain, eye pain, hearing loss, nose bleeds, sore throat Cardiac:  No dizziness, chest pain or heaviness, chest tightness,edema, No JVD Resp:   No cough, -sputum production, -shortness of breath,-wheezing, -hemoptysis,  Other:  All other systems negative   Physical Examination:   General Appearance: No distress  EYES PERRLA, EOM intact.   NECK Supple, No JVD Pulmonary: normal breath sounds, No wheezing.  CardiovascularNormal S1,S2.  No m/r/g.   Abdomen: Benign, Soft, non-tender. Neurology UE/LE 5/5 strength, no focal deficits Ext pulses intact, cap refill intact ALL OTHER ROS ARE NEGATIVE      ASSESSMENT/PLAN   68 year old white female seen today for follow-up assessment for COPD underlying grade 2 diastolic dysfunction with underlying sleep apnea enrolled in lung cancer screening program due to extensive smoking history   Assessment of COPD mild FEV1 60% predicted  No  exacerbation at this time  No indication for antibiotics or prednisone   Albuterol  as needed  Patient is not on any maintenance inhaler therapy at this time Avoid Allergens and Irritants Avoid secondhand smoke Avoid SICK contacts Recommend  Masking  when appropriate Recommend Keep up-to-date with vaccinations    Assessment of OSA Previous AHI 12 Continue CPAP as prescribed  Excellent compliance report Reviewed compliance report in detail with patient Patient definitely benefits the use of CPAP therapy as prescribed Using CPAP nightly and with naps Pressure setting is comfortable and is sleeping well. CPAP prescription 11 AHI reduced to 2  No evidence of acute heart failure at this time No respiratory distress No fevers, chills, nausea, vomiting, diarrhea No evidence hemoptysis  Patient Instructions Continue to use CPAP every night, minimum of 4-6 hours a night.  Change equipment every 30 days or as directed by DME.  Wash your tubing with warm soap and water  daily, hang to dry. Wash humidifier portion weekly. Use bottled, distilled water and change daily   Be aware of reduced alertness and do not drive or operate heavy machinery if experiencing this or drowsiness.  Exercise encouraged, as tolerated. Encouraged proper weight management.  Important to get eight or more hours of sleep  Limiting the use of the computer and television before bedtime.  Decrease naps during the day, so night time sleep will become enhanced.  Limit caffeine, and sleep deprivation.  HTN, stroke, uncontrolled diabetes and heart failure are potential risk factors.  Risk of untreated sleep apnea including cardiac arrhthymias, stroke, DM, pulm HTN.    Diastolic heart failure grade 2 Follow-up cardiology   Lung cancer screening program Abnormal CT scan Lung cancer screening protocol CT chest March 2025 reviewed in detail with patient independently Stable left lung nodule Follow-up annually   Patient at risk for cancer   MEDICATION ADJUSTMENTS/LABS AND TESTS ORDERED: Albuterol  as needed Follow-up low-dose CT scan screening annually Continue CPAP as prescribed Avoid secondhand smoke Avoid SICK contacts Recommend  Masking  when appropriate Recommend Keep up-to-date with vaccinations   CURRENT MEDICATIONS REVIEWED AT LENGTH WITH PATIENT TODAY   Patient  satisfied with Plan of action and management. All questions answered   Follow up 6 months   I spent a total of 41 minutes reviewing chart data, face-to-face evaluation with the patient, counseling and coordination of care as detailed above.      Nickolas Alm Cellar, M.D.  [facility]  Medical Director [facility] Medical Director [facility] Cardio-Pulmonary Department

## 2023-09-30 DIAGNOSIS — E119 Type 2 diabetes mellitus without complications: Secondary | ICD-10-CM | POA: Diagnosis not present

## 2023-09-30 DIAGNOSIS — M7541 Impingement syndrome of right shoulder: Secondary | ICD-10-CM | POA: Diagnosis not present

## 2023-10-06 NOTE — Progress Notes (Unsigned)
 [facility] MD/PA/NP OP Progress Note  10/06/2023 2:29 PM Katelyn Cooper  MRN:  [id number]  Chief Complaint: No chief complaint on file.  HPI: *** Visit Diagnosis: No diagnosis found.  Past Psychiatric History: ***  Past Medical History:  Past Medical History:  Diagnosis Date   (HFpEF) heart failure with preserved ejection fraction (HCC)    a. 12/2015 Echo: EF 55-60%, nl RV size/fxn, no significant valvular abnormalities; b. 10/2017 Echo: EF 50-55%, antsept, ant HK. Gr2 DD. Mild MR. Nl RV fxn. PASP .   Actinic keratosis    Anxiety    Basal cell carcinoma 08/08/2017   L nose above mid nasal alar groove   CHF (congestive heart failure) (HCC)    Depression    Diabetes type 2, controlled (HCC)    diet-controlled   Esophageal stricture    GERD (gastroesophageal reflux disease)    Hiatal hernia    History of chicken pox    Hyperlipidemia    Hypertension    Internal hemorrhoids    Non-obstructive CAD (coronary artery disease)    a. 12/2015 MV: apical ant and apical reversible defect. EF 55-65%; b. 01/2016 Cath: LM nl, LAD nl, SP1 40ost, LCX nl, OM1 30, RCA 30p/m, EDP 15-3mmHg; c. 10/2017 Cath: LM nl, LAD nl, SP1 30ost, LCX nl, OM1 30, RCA 30p/m. EF 55%.   Obstructive sleep apnea    PAD (peripheral artery disease) (HCC)    a. 1990's s/p prior LCIA stenting; b. 12/2015 ABI: R 1.05, L 1.03.   Panic disorder     Past Surgical History:  Procedure Laterality Date   AORTA SURGERY  1999   stent placement; ? left iliac artery intervention   APPENDECTOMY  1998   BASAL CELL CARCINOMA EXCISION     C5 - fusion N/A 10/10/2021   CARDIAC CATHETERIZATION N/A 01/12/2016   Procedure: Left Heart Cath and Coronary Angiography;  Surgeon: Lonni Hanson, MD;  Location: [facility] INVASIVE CV LAB;  Service: Cardiovascular;  Laterality: N/A;   COLONOSCOPY  07/17/2019   FOOT SURGERY     Right   pap smear  03/2010   Done by Dr. Jon Rummer, normal   REFRACTIVE SURGERY     right   RIB RESECTION  8009,8006    RIGHT/LEFT HEART CATH AND CORONARY ANGIOGRAPHY N/A 11/08/2017   Procedure: RIGHT/LEFT HEART CATH AND CORONARY ANGIOGRAPHY;  Surgeon: Darron Deatrice LABOR, MD;  Location: [facility] INVASIVE CV LAB;  Service: Cardiovascular;  Laterality: N/A;   SHOULDER SURGERY  2005   left   UPPER GASTROINTESTINAL ENDOSCOPY  07/17/2019   UPPER GI ENDOSCOPY  09/19/2011   Stricture at GE junction, dilated. Mild gastritis and duodenitis. Small hiatal hernia    Family Psychiatric History: ***  Family History:  Family History  Problem Relation Age of Onset   Lung cancer Mother        was a smoker   Emphysema Mother    Cancer Mother        Lung   Alcohol abuse Mother    Depression Mother    Heart disease Mother    Drug abuse Sister    Breast cancer Sister        x 2  (30&50)   Emphysema Sister    Bipolar disorder Sister    Alcohol abuse Other    Depression Other    Bipolar disorder Other    Heart disease Other    Vision loss Other    Alcohol abuse Father    Mood Disorder Father  Heart disease Maternal Grandmother    Stroke Maternal Grandmother    Esophageal cancer Maternal Aunt    Colon cancer Neg Hx    Colon polyps Neg Hx    Rectal cancer Neg Hx    Stomach cancer Neg Hx     Social History:  Social History   Socioeconomic History   Marital status: Married    Spouse name: mark   Number of children: 0   Years of education: Not on file   Highest education level: High school graduate  Occupational History   Occupation: computer-retired    Employer: [employer]  Tobacco Use   Smoking status: Former    Current packs/day: 0.00    Types: Cigarettes    Quit date: 11/16/2022    Years since quitting: 0.8   Smokeless tobacco: Never  Vaping Use   Vaping status: Former  Substance and Sexual Activity   Alcohol use: No    Alcohol/week: 0.0 standard drinks of alcohol   Drug use: No   Sexual activity: Not Currently  Other Topics Concern   Not on file  Social History Narrative   Not on file    Social Drivers of Health   Financial Resource Strain: Low Risk  (12/12/2021)   Overall Financial Resource Strain (CARDIA)    Difficulty of Paying Living Expenses: Not hard at all  Food Insecurity: No Food Insecurity (08/07/2023)   Hunger Vital Sign    Worried About Running Out of Food in the Last Year: Never true    Ran Out of Food in the Last Year: Never true  Transportation Needs: No Transportation Needs (08/07/2023)   PRAPARE - Administrator, Civil Service (Medical): No    Lack of Transportation (Non-Medical): No  Physical Activity: Inactive (12/12/2021)   Exercise Vital Sign    Days of Exercise per Week: 0 days    Minutes of Exercise per Session: 0 min  Stress: No Stress Concern Present (12/12/2021)   [employer] of Occupational Health - Occupational Stress Questionnaire    Feeling of Stress : Only a little  Social Connections: Socially Isolated (08/07/2023)   Social Connection and Isolation Panel    Frequency of Communication with Friends and Family: Once a week    Frequency of Social Gatherings with Friends and Family: Never    Attends Religious Services: Never    Database administrator or Organizations: No    Attends Banker Meetings: Never    Marital Status: Married    Allergies:  Allergies  Allergen Reactions   Lovastatin     depression   Paroxetine Dermatitis   Paroxetine Hcl     itching   Lamotrigine Rash    Metabolic Disorder Labs: Lab Results  Component Value Date   HGBA1C 6.6 (H) 08/06/2023   MPG 142.72 08/06/2023   No results found for: PROLACTIN Lab Results  Component Value Date   CHOL 126 02/25/2023   TRIG 232 (H) 02/25/2023   HDL 38 (L) 02/25/2023   CHOLHDL 3.3 02/25/2023   VLDL 39 05/08/2022   LDLCALC 51 02/25/2023   LDLCALC 78 05/08/2022   Lab Results  Component Value Date   TSH 0.934 10/18/2021   TSH 1.660 06/02/2020    Therapeutic Level Labs: No results found for: LITHIUM No results found for:  VALPROATE No results found for: CBMZ  Current Medications: Current Outpatient Medications  Medication Sig Dispense Refill   acetaminophen  (TYLENOL ) 500 MG tablet Take 500 mg by mouth every 6 (  six) hours as needed.     albuterol  (VENTOLIN  HFA) 108 (90 Base) MCG/ACT inhaler inhale 2 puffs by mouth every 6 hours if needed for wheezing or shortness of breath 18 g 6   aspirin  EC 81 MG tablet Take 1 tablet (81 mg total) by mouth daily.     atorvastatin  (LIPITOR) 80 MG tablet Take 1 tablet by mouth daily. 90 tablet 3   cyclobenzaprine  (FLEXERIL ) 10 MG tablet Take 10 mg by mouth 3 (three) times daily.     doxepin  (SINEQUAN ) 10 MG capsule Take 1 capsule (10 mg total) by mouth at bedtime as needed (insomnia). 90 capsule 0   ezetimibe  (ZETIA ) 10 MG tablet Take 1 tablet by mouth daily. 90 tablet 3   fluticasone  (FLONASE ) 50 MCG/ACT nasal spray SPRAY 2 SPRAYS INTO EACH NOSTRIL EVERY DAY 48 mL 1   [Paused] furosemide  (LASIX ) 20 MG tablet Take 1 tablet by mouth every other day and may take 1 tablet by mouth  as needed for increased fluid or weight gain on days that you would normally not take a tablet. 90 tablet 2   glucose blood test strip ONETOUCH ULTRA MINI STRIPS AND LANCETS. Use one daily to check blood sugar. 100 each 3   metFORMIN  (GLUCOPHAGE -XR) 500 MG 24 hr tablet Take 1 tablet by mouth twice daily. 180 tablet 4   metoprolol  tartrate (LOPRESSOR ) 25 MG tablet Take 0.5 tablets (12.5 mg total) by mouth 2 (two) times daily. 90 tablet 3   pantoprazole  (PROTONIX ) 40 MG tablet Take 1 tablet (40 mg total) by mouth daily.     potassium chloride  (KLOR-CON ) 10 MEQ tablet Take 1 tablet (10 mEq total) by mouth daily. 90 tablet 3   QUEtiapine  (SEROQUEL ) 100 MG tablet Take 1 tablet (100 mg total) by mouth at bedtime. 90 tablet 1   sucralfate  (CARAFATE ) 1 g tablet Take 1 tablet (1 g total) by mouth 4 (four) times daily -  with meals and at bedtime. 120 tablet 5   venlafaxine  XR (EFFEXOR -XR) 75 MG 24 hr capsule  Take 1 capsule (75 mg total) by mouth daily with breakfast. 90 capsule 0   No current facility-administered medications for this visit.     Musculoskeletal: Strength & Muscle Tone: {desc; muscle tone:32375} Gait & Station: {PE GAIT ED WJUO:77474} Patient leans: {Patient Leans:21022755}  Psychiatric Specialty Exam: Review of Systems  There were no vitals taken for this visit.There is no height or weight on file to calculate BMI.  General Appearance: {Appearance:22683}  Eye Contact:  {BHH EYE CONTACT:22684}  Speech:  {Speech:22685}  Volume:  {Volume (PAA):22686}  Mood:  {BHH MOOD:22306}  Affect:  {Affect (PAA):22687}  Thought Process:  {Thought Process (PAA):22688}  Orientation:  {BHH ORIENTATION (PAA):22689}  Thought Content: {Thought Content:22690}   Suicidal Thoughts:  {ST/HT (PAA):22692}  Homicidal Thoughts:  {ST/HT (PAA):22692}  Memory:  {BHH MEMORY:22881}  Judgement:  {Judgement (PAA):22694}  Insight:  {Insight (PAA):22695}  Psychomotor Activity:  {Psychomotor (PAA):22696}  Concentration:  {Concentration:21399}  Recall:  {BHH GOOD/FAIR/POOR:22877}  Fund of Knowledge: {BHH GOOD/FAIR/POOR:22877}  Language: {BHH GOOD/FAIR/POOR:22877}  Akathisia:  {BHH YES OR NO:22294}  Handed:  {Handed:22697}  AIMS (if indicated): {Desc; done/not:10129}  Assets:  {Assets (PAA):22698}  ADL's:  {BHH JIO'D:77709}  Cognition: {chl bhh cognition:304700322}  Sleep:  {BHH GOOD/FAIR/POOR:22877}   Screenings: AIMS    Flowsheet Row Office Visit from 03/10/2018 in [facility]  AIMS Total Score 0   GAD-7    Flowsheet Row Office Visit from 08/16/2023 in  [facility] Office Visit from 02/25/2023 in [facility]  Total GAD-7 Score 0 0   PHQ2-9    Flowsheet Row Office Visit from 08/16/2023 in [facility] Office Visit from 02/25/2023 in [facility]  Office Visit from 01/14/2023 in [facility] Office Visit from 10/24/2022 in [facility] Office Visit from 10/08/2022 in [facility]  PHQ-2 Total Score 0 0 0 0 0  PHQ-9 Total Score 1 1 -- 1 --   Flowsheet Row ED to Hosp-Admission (Discharged) from 08/06/2023 in [facility] Most recent reading at 08/07/2023 12:57 PM UC from 08/06/2023 in [facility]  Most recent reading at 08/06/2023 10:29 AM ED to Hosp-Admission (Discharged) from 10/18/2021 in [facility] Most recent reading at 10/18/2021 12:28 AM  C-SSRS RISK CATEGORY No Risk No Risk No Risk     Assessment and Plan: ***  Collaboration of Care: Collaboration of Care: [facility] OP Collaboration of Care:[id number]}  Patient/Guardian was advised Release of Information must be obtained prior to any record release in order to collaborate their care with an outside provider. Patient/Guardian was advised if they have not already done so to contact the registration department to sign all necessary forms in order for us  to release information regarding their care.   Consent: Patient/Guardian gives verbal consent for treatment and assignment of benefits for services provided during this visit. Patient/Guardian expressed understanding and agreed to proceed.    Katheren Sleet, MD 10/06/2023, 2:29 PM

## 2023-10-10 ENCOUNTER — Other Ambulatory Visit: Payer: Self-pay

## 2023-10-10 ENCOUNTER — Encounter: Payer: Self-pay | Admitting: Psychiatry

## 2023-10-10 ENCOUNTER — Ambulatory Visit (INDEPENDENT_AMBULATORY_CARE_PROVIDER_SITE_OTHER): Admitting: Psychiatry

## 2023-10-10 VITALS — BP 97/62 | HR 82 | Temp 96.5°F | Ht 63.0 in | Wt 152.2 lb

## 2023-10-10 DIAGNOSIS — F411 Generalized anxiety disorder: Secondary | ICD-10-CM | POA: Diagnosis not present

## 2023-10-10 DIAGNOSIS — G4700 Insomnia, unspecified: Secondary | ICD-10-CM

## 2023-10-10 DIAGNOSIS — F3341 Major depressive disorder, recurrent, in partial remission: Secondary | ICD-10-CM | POA: Diagnosis not present

## 2023-10-10 DIAGNOSIS — Z79899 Other long term (current) drug therapy: Secondary | ICD-10-CM | POA: Diagnosis not present

## 2023-10-10 NOTE — Patient Instructions (Signed)
 Continue venlafaxine  75 mg daily Continue quetiapine  100 mg at night  Continue doxepin  10 mg at night as needed for insomnia Consider Nutrition or dietician referral Next appointment- 9/2 at 11 am

## 2023-10-11 ENCOUNTER — Ambulatory Visit: Payer: Self-pay | Admitting: Physician Assistant

## 2023-10-11 LAB — BASIC METABOLIC PANEL WITH GFR
BUN/Creatinine Ratio: 8 — ABNORMAL LOW (ref 12–28)
BUN: 8 mg/dL (ref 8–27)
CO2: 19 mmol/L — ABNORMAL LOW (ref 20–29)
Calcium: 9.4 mg/dL (ref 8.7–10.3)
Chloride: 98 mmol/L (ref 96–106)
Creatinine, Ser: 0.95 mg/dL (ref 0.57–1.00)
Glucose: 204 mg/dL — ABNORMAL HIGH (ref 70–99)
Potassium: 3.9 mmol/L (ref 3.5–5.2)
Sodium: 138 mmol/L (ref 134–144)
eGFR: 65 mL/min/1.73 (ref >59)

## 2023-10-23 ENCOUNTER — Ambulatory Visit: Attending: Physician Assistant

## 2023-10-23 ENCOUNTER — Ambulatory Visit (INDEPENDENT_AMBULATORY_CARE_PROVIDER_SITE_OTHER)

## 2023-10-23 DIAGNOSIS — Z95828 Presence of other vascular implants and grafts: Secondary | ICD-10-CM

## 2023-10-23 DIAGNOSIS — I739 Peripheral vascular disease, unspecified: Secondary | ICD-10-CM | POA: Diagnosis not present

## 2023-10-23 LAB — VAS US ABI WITH/WO TBI
Left ABI: 1.05
Right ABI: 1.15

## 2023-11-08 ENCOUNTER — Ambulatory Visit (INDEPENDENT_AMBULATORY_CARE_PROVIDER_SITE_OTHER): Admitting: Family Medicine

## 2023-11-08 ENCOUNTER — Other Ambulatory Visit: Payer: Self-pay | Admitting: Psychiatry

## 2023-11-08 ENCOUNTER — Encounter: Payer: Self-pay | Admitting: Family Medicine

## 2023-11-08 VITALS — BP 108/60 | HR 81 | Temp 98.2°F | Ht 63.0 in

## 2023-11-08 DIAGNOSIS — R2689 Other abnormalities of gait and mobility: Secondary | ICD-10-CM | POA: Diagnosis not present

## 2023-11-08 DIAGNOSIS — E1151 Type 2 diabetes mellitus with diabetic peripheral angiopathy without gangrene: Secondary | ICD-10-CM

## 2023-11-08 DIAGNOSIS — K219 Gastro-esophageal reflux disease without esophagitis: Secondary | ICD-10-CM

## 2023-11-08 DIAGNOSIS — Z7984 Long term (current) use of oral hypoglycemic drugs: Secondary | ICD-10-CM

## 2023-11-08 DIAGNOSIS — M545 Low back pain, unspecified: Secondary | ICD-10-CM | POA: Diagnosis not present

## 2023-11-08 DIAGNOSIS — F3341 Major depressive disorder, recurrent, in partial remission: Secondary | ICD-10-CM

## 2023-11-08 DIAGNOSIS — I10 Essential (primary) hypertension: Secondary | ICD-10-CM | POA: Diagnosis not present

## 2023-11-08 LAB — POCT GLYCOSYLATED HEMOGLOBIN (HGB A1C)
Est. average glucose Bld gHb Est-mCnc: 180
Hemoglobin A1C: 7.9 % — AB (ref 4.0–5.6)

## 2023-11-08 NOTE — Patient Instructions (Signed)
 [name]  Please review the attached list of medications and notify my office if there are any errors.   . Please bring all of your medications to every appointment so we can make sure that our medication list is the same as yours.

## 2023-11-08 NOTE — Progress Notes (Signed)
 Established patient visit   Patient: Katelyn Cooper   DOB: Jun 30, 1955   68 y.o. Female  MRN: [id number] Visit Date: 11/08/2023  Today's healthcare provider: Nancyann Perry, MD   Chief Complaint  Patient presents with   Medical Management of Chronic Issues    Patient would like to know if you would recommend for her to get shingrix, she had live vaccine in 2014   Dizziness    Patient states she has been off balance for about 2 weeks. States she will bump into objects and has to hold onto things to keep balance   Back Pain    Lower right side of back, onset 3 weeks. Patient states there was no injury that she recalls    Subjective    Discussed the use of AI scribe software for clinical note transcription with the patient, who gave verbal consent to proceed.  History of Present Illness   Katelyn Cooper is a 68 year old female with type 2 diabetes who presents for follow-up on her blood sugar management.  Her last A1c was 6.6% in early May. She is currently taking metformin  but discontinued Mounjaro  due to severe heartburn, which required frequent use of Tums which led to ER visit for hypercalcemia. She is now on sucralfate  and pantoprazole  for heartburn management and is scheduled to follow up with GI in November. Her blood sugar levels have been generally good, although she does not check them frequently at home. She walks at the mall three days a week, covering over two miles each day, which takes about fifty minutes.  She has been experiencing right-sided lower back pain for about two weeks. The pain is bothersome, especially when sitting. She suspects it might be related to walking in old sandals and has since switched to new tennis shoes. She has not had any recent imaging of her back but recalls having an MRI two years ago.  She reports balance issues that started about three weeks ago, with occasional episodes of feeling like she is losing her balance. This is not a  constant issue, and she has not experienced any spinning sensations or double vision. She had a CT scan recently, which was normal.  She is concerned about her shingles vaccination status, as she is unsure if she received the second dose of the new vaccine. Her family history includes siblings who have had shingles.     Lab Results  Component Value Date   NA 138 10/10/2023   CL 98 10/10/2023   K 3.9 10/10/2023   CO2 19 (L) 10/10/2023   BUN 8 10/10/2023   CREATININE 0.95 10/10/2023   EGFR 65 10/10/2023   CALCIUM  9.4 10/10/2023   PHOS 3.1 08/08/2023   ALBUMIN 3.6 08/09/2023   GLUCOSE 204 (H) 10/10/2023   Lab Results  Component Value Date   CHOL 126 02/25/2023   HDL 38 (L) 02/25/2023   LDLCALC 51 02/25/2023   LDLDIRECT 57 02/25/2023   TRIG 232 (H) 02/25/2023   CHOLHDL 3.3 02/25/2023     Medications: Outpatient Medications Prior to Visit  Medication Sig   acetaminophen  (TYLENOL ) 500 MG tablet Take 500 mg by mouth every 6 (six) hours as needed.   albuterol  (VENTOLIN  HFA) 108 (90 Base) MCG/ACT inhaler inhale 2 puffs by mouth every 6 hours if needed for wheezing or shortness of breath   aspirin  EC 81 MG tablet Take 1 tablet (81 mg total) by mouth daily.   atorvastatin  (LIPITOR) 80  MG tablet Take 1 tablet by mouth daily.   doxepin  (SINEQUAN ) 10 MG capsule Take 1 capsule (10 mg total) by mouth at bedtime as needed (insomnia).   ezetimibe  (ZETIA ) 10 MG tablet Take 1 tablet by mouth daily.   fluticasone  (FLONASE ) 50 MCG/ACT nasal spray SPRAY 2 SPRAYS INTO EACH NOSTRIL EVERY DAY   [Paused] furosemide  (LASIX ) 20 MG tablet Take 1 tablet by mouth every other day and may take 1 tablet by mouth  as needed for increased fluid or weight gain on days that you would normally not take a tablet.   glucose blood test strip ONETOUCH ULTRA MINI STRIPS AND LANCETS. Use one daily to check blood sugar.   metFORMIN  (GLUCOPHAGE -XR) 500 MG 24 hr tablet Take 1 tablet by mouth twice daily.   metoprolol   tartrate (LOPRESSOR ) 25 MG tablet Take 0.5 tablets (12.5 mg total) by mouth 2 (two) times daily.   pantoprazole  (PROTONIX ) 40 MG tablet Take 1 tablet (40 mg total) by mouth daily.   potassium chloride  (KLOR-CON ) 10 MEQ tablet Take 1 tablet (10 mEq total) by mouth daily.   QUEtiapine  (SEROQUEL ) 100 MG tablet Take 1 tablet (100 mg total) by mouth at bedtime.   sucralfate  (CARAFATE ) 1 g tablet Take 1 tablet (1 g total) by mouth 4 (four) times daily -  with meals and at bedtime.   venlafaxine  XR (EFFEXOR -XR) 75 MG 24 hr capsule Take 1 capsule (75 mg total) by mouth daily with breakfast.   [DISCONTINUED] cyclobenzaprine  (FLEXERIL ) 10 MG tablet Take 10 mg by mouth 3 (three) times daily.   No facility-administered medications prior to visit.   Review of Systems     Objective    BP 108/60 (BP Location: Left Arm, Patient Position: Sitting, Cuff Size: Normal)   Pulse 81   Temp 98.2 F (36.8 C) (Oral)   Ht 5' 3 (1.6 m)   SpO2 95%   BMI 26.96 kg/m   Physical Exam   General: Appearance:    Well developed, well nourished female in no acute distress  Eyes:    PERRL, conjunctiva/corneas clear, EOM's intact       Lungs:     Clear to auscultation bilaterally, respirations unlabored  Heart:    Normal heart rate. Normal rhythm. No murmurs, rubs, or gallops.    MS:   All extremities are intact.  Mild tenderness right lateral LS spine. No gross deformities.   Neurologic:   Awake, alert, oriented x 3. No apparent focal neurological defect. Normal finger to nose, normal walking gait, negative Romberg.         Results for orders placed or performed in visit on 11/08/23  POCT HgB A1C  Result Value Ref Range   Hemoglobin A1C 7.9 (A) 4.0 - 5.6 %   Est. average glucose Bld gHb Est-mCnc 180      Assessment & Plan        Type 2 diabetes mellitus A1c increased to 7.8%. Off Mounjaro  due to heartburn. Managed with metformin . Dietary changes and weight loss considered for glycemic control. Possible  medication adjustment if no improvement in 3-4 months. - Refer to nutritionist for dietary management and weight loss support. - Continue metformin . No GLP-1/GIPs due to severe heartburn - Monitor A1c and consider medication adjustment if A1c does not improve in 3-4 months.  Obesity Interest in weight loss with regular walking and dietary changes. - Refer to nutritionist for dietary management and weight loss support.  Gastroesophageal reflux disease and dysphagia Managed with sucralfate  and  pantoprazole . Dysphagia likely esophageal, awaiting dilation in November. - Continue sucralfate  and pantoprazole . - Await esophageal dilation procedure in November.  Low back pain with lumbar degenerative disc disease Chronic pain in right lower back, likely arthritis or disc disease. MRI showed disc disease. Avoid NSAIDs due to reflux. - Use lidocaine  patches or Salonpas for pain management. - Consider follow-up x-ray if pain worsens or does not improve in 3-4 weeks.  Impaired balance Intermittent balance issues, no continuous symptoms or neurological deficits. Recent CT normal. Possible medication or age-related. - Consider physical therapy for balance exercises if symptoms persist.  Shingles vaccination No record of second dose of new shingles vaccine. Family history of shingles, wishes to avoid it. - Administer second dose of new shingles vaccine.    Return in about 4 months (around 03/09/2024) for Diabetes.     Nancyann Perry, MD  [facility] [phone] (phone) [phone] (fax)  [facility]

## 2023-11-11 DIAGNOSIS — M7541 Impingement syndrome of right shoulder: Secondary | ICD-10-CM | POA: Diagnosis not present

## 2023-11-11 DIAGNOSIS — M25811 Other specified joint disorders, right shoulder: Secondary | ICD-10-CM | POA: Diagnosis not present

## 2023-11-26 ENCOUNTER — Encounter: Payer: Self-pay | Admitting: Family Medicine
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
## 2023-12-03 ENCOUNTER — Encounter: Payer: Self-pay | Admitting: Psychiatry

## 2023-12-03 ENCOUNTER — Ambulatory Visit (INDEPENDENT_AMBULATORY_CARE_PROVIDER_SITE_OTHER): Admitting: Psychiatry

## 2023-12-03 VITALS — BP 126/72 | HR 95 | Temp 97.9°F | Ht 63.0 in | Wt 152.0 lb

## 2023-12-03 DIAGNOSIS — G4700 Insomnia, unspecified: Secondary | ICD-10-CM

## 2023-12-03 DIAGNOSIS — F411 Generalized anxiety disorder: Secondary | ICD-10-CM

## 2023-12-03 DIAGNOSIS — F3341 Major depressive disorder, recurrent, in partial remission: Secondary | ICD-10-CM

## 2023-12-03 MED ORDER — DOXEPIN HCL 10 MG PO CAPS: 10.0000 mg | ORAL_CAPSULE | Freq: Every evening | ORAL | 0 refills | Status: DC | PRN

## 2023-12-03 NOTE — Patient Instructions (Signed)
 Continue venlafaxine  75 mg daily Continue quetiapine  100 mg at night  Continue doxepin  10 mg at night as needed for insomnia Next appointment- 11/4 at 10:30

## 2023-12-05 ENCOUNTER — Encounter: Payer: Self-pay | Admitting: Dietician

## 2023-12-05 ENCOUNTER — Encounter: Attending: Family Medicine | Admitting: Dietician

## 2023-12-05 VITALS — Ht 63.0 in | Wt 152.0 lb

## 2023-12-05 DIAGNOSIS — E1151 Type 2 diabetes mellitus with diabetic peripheral angiopathy without gangrene: Secondary | ICD-10-CM | POA: Insufficient documentation

## 2023-12-05 DIAGNOSIS — E785 Hyperlipidemia, unspecified: Secondary | ICD-10-CM | POA: Diagnosis not present

## 2023-12-05 DIAGNOSIS — K219 Gastro-esophageal reflux disease without esophagitis: Secondary | ICD-10-CM | POA: Insufficient documentation

## 2023-12-05 DIAGNOSIS — I10 Essential (primary) hypertension: Secondary | ICD-10-CM | POA: Insufficient documentation

## 2023-12-05 DIAGNOSIS — E1169 Type 2 diabetes mellitus with other specified complication: Secondary | ICD-10-CM

## 2023-12-05 DIAGNOSIS — Z713 Dietary counseling and surveillance: Secondary | ICD-10-CM | POA: Insufficient documentation

## 2023-12-05 DIAGNOSIS — R1310 Dysphagia, unspecified: Secondary | ICD-10-CM | POA: Insufficient documentation

## 2023-12-05 DIAGNOSIS — E119 Type 2 diabetes mellitus without complications: Secondary | ICD-10-CM

## 2023-12-05 NOTE — Patient Instructions (Addendum)
 For flavor in water, try Splash water [employer] product), or a cold-water infuser like [place] or [place] brands Plan to include a vegetable with most meals, focus on favorites most of the time. Try cooking in air fryer, or roast in oven if not on grill. Include at least a small portion of a protein food with most meals. Peanut butter, eggs, [place] yogurt (try blending vanilla with 1/3 cup peanut butter and 2-3 tsps honey for a tasty fruit dip or for graham crackers). This can help prevent weak, droopy feeling between meals Keep up the regular exercise, great job!

## 2023-12-05 NOTE — Progress Notes (Signed)
 Medical Nutrition Therapy: Visit start time: 1030  end time: 1130  Assessment:   Referral Diagnosis: Type 2 Diabetes Other medical history/ diagnoses: hyperlipidemia, HTN, GERD, overweight Psychosocial issues/ stress concerns: PTSD, history of bipolar disorder, depression  Medications, supplements: reconciled list in medical record   Current weight: 152lbs Height: 5'3 BMI: 26.93 Patient's personal weight goal: ideally 120lbs, would like to lose at least 10lbs  Progress and evaluation:  Diet choices limited right now due to esophageal  reflux/ stricture; has had dilation done 2x in past, states she needs it again. Current symptoms include heartburn, pain after/ difficulty swallowing some foods  Food allergies: avoiding acidic foods due to GERD Reports selective eating, does not like most veg (does eat zucchini/ yellow squash if grilled, likes mushrooms, onions, cucumber, carrots, some lettuce) Patient reports following low sodium diet for BP control by choosing low/ reduced sodium packaged foods, limiting restaurant foods She states she sometimes feels weak in between meals. Patient seeks help with weight loss to help with heart health, diabetes Recent lab results: 11/08/23 HbA1C 7.9%; 10/10/23 glucose 20411/25/24 total cholesterol 126, HDL 38, LDL 51, Triglycerides 232   Dietary Intake:  Usual eating pattern includes 2-3 meals and 1-2 snacks per day. Dining out frequency: 0-1 Who plans meals/ buys groceries? Self Who prepares meals? self  Breakfast: oatmeal (inst, low sugar); sm bowl cereal ie frosted flakes Snack: occasionally 100-cal granola bar Lunch: 2pc toast with butter; leftovers ie rice with butter -- without meat/ cheese; occ 1/2 ham and cheese sandwich Snack: fruit ie watermelon, cantaloupe; lite cheese stick; used to enjoy nuts/ pecans Supper: salisbury steak with low sodium gravy; grilled cheese sandwich; nachos; PBJ sandwich with chips; breakfast tacos/ reg tacos -- often  tex-mex, tortillas, does like beans Snack: sometimes sm amt chocolate Beverages: water, pepsi  Physical activity: walking 2.5 miles, 3x a week in mall   Intervention:   Nutrition Care Education:   Basic nutrition: basic food groups; appropriate nutrient balance; appropriate meal and snack schedule; general nutrition guidelines    Weight control: importance of low sugar and low fat choices; estimated energy needs for weight loss at 1200 kcal, provided guidance for 45% CHO, 25% pro, (30% fat) Advanced nutrition: cooking techniques for vegetables Diabetes: appropriate meal and snack schedule; appropriate carb intake and balance, healthy carb choices; role of fiber, protein, fat; physical activity Hypertension: identifying food sources of potassium, magnesium Hyperlipidemia: healthy and unhealthy fats; role of fiber; role of exercise   Other intervention notes: Patient has been working on positive and appropriate diet changes, and is motivated to continue.  Established goals to increase vegetable intake, to include protein sources regularly throughout the day    Nutritional Diagnosis:  [place]-2.1 Inpaired nutrition utilization and [place]-2.2 Altered nutrition-related laboratory As related to Type 2 diabetes, hyperlipidemia, hypertension.  As evidenced by elevated HbA1C, elevated triglycerides, total and LDL cholesterol and HTN controlled with medications. [place]-3.3 Overweight/obesity As related to history of excess calories, inadequate physical activity, stress/ medications.  As evidenced by patient with current BMI of 27.   Education Materials given:  [employer] guidelines for Diabetes Designer, industrial/product with food lists, sample meal pattern Sample menus Visit summary with goals/ instructions   Learner/ who was taught:  Patient   Level of understanding: Verbalizes/ demonstrates competency  Demonstrated degree of understanding via:   Teach back Learning barriers: None  Willingness to  learn/ readiness for change: Eager, change in progress  Monitoring and Evaluation:  Dietary intake, exercise, glucose control, and  body weight      follow up: 01/16/24

## 2023-12-06 DIAGNOSIS — G4733 Obstructive sleep apnea (adult) (pediatric): Secondary | ICD-10-CM | POA: Diagnosis not present

## 2023-12-16 ENCOUNTER — Emergency Department

## 2023-12-16 ENCOUNTER — Other Ambulatory Visit
Admission: RE | Admit: 2023-12-16 | Discharge: 2023-12-16 | Disposition: A | Discharge status: Home / Self Care | Source: Ambulatory Visit | Attending: Cardiology | Admitting: Cardiology

## 2023-12-16 ENCOUNTER — Other Ambulatory Visit: Payer: Self-pay

## 2023-12-16 ENCOUNTER — Emergency Department
Admission: EM | Admit: 2023-12-16 | Discharge: 2023-12-16 | Disposition: A | Discharge status: Home / Self Care | Attending: Emergency Medicine | Admitting: Emergency Medicine

## 2023-12-16 ENCOUNTER — Telehealth: Payer: Self-pay | Admitting: Internal Medicine

## 2023-12-16 ENCOUNTER — Ambulatory Visit: Payer: Self-pay | Admitting: Cardiology

## 2023-12-16 ENCOUNTER — Ambulatory Visit: Attending: Cardiology | Admitting: Cardiology

## 2023-12-16 ENCOUNTER — Encounter: Payer: Self-pay | Admitting: Cardiology

## 2023-12-16 VITALS — BP 132/60 | HR 81 | Ht 63.0 in | Wt 150.8 lb

## 2023-12-16 DIAGNOSIS — R7989 Other specified abnormal findings of blood chemistry: Secondary | ICD-10-CM | POA: Insufficient documentation

## 2023-12-16 DIAGNOSIS — I2510 Atherosclerotic heart disease of native coronary artery without angina pectoris: Secondary | ICD-10-CM | POA: Insufficient documentation

## 2023-12-16 DIAGNOSIS — I739 Peripheral vascular disease, unspecified: Secondary | ICD-10-CM | POA: Diagnosis not present

## 2023-12-16 DIAGNOSIS — K219 Gastro-esophageal reflux disease without esophagitis: Secondary | ICD-10-CM | POA: Diagnosis not present

## 2023-12-16 DIAGNOSIS — I700 Atherosclerosis of aorta: Secondary | ICD-10-CM | POA: Diagnosis not present

## 2023-12-16 DIAGNOSIS — J432 Centrilobular emphysema: Secondary | ICD-10-CM | POA: Diagnosis not present

## 2023-12-16 DIAGNOSIS — I10 Essential (primary) hypertension: Secondary | ICD-10-CM | POA: Diagnosis not present

## 2023-12-16 DIAGNOSIS — R1310 Dysphagia, unspecified: Secondary | ICD-10-CM | POA: Diagnosis not present

## 2023-12-16 DIAGNOSIS — R0602 Shortness of breath: Secondary | ICD-10-CM

## 2023-12-16 DIAGNOSIS — I272 Pulmonary hypertension, unspecified: Secondary | ICD-10-CM

## 2023-12-16 DIAGNOSIS — I340 Nonrheumatic mitral (valve) insufficiency: Secondary | ICD-10-CM

## 2023-12-16 DIAGNOSIS — Z713 Dietary counseling and surveillance: Secondary | ICD-10-CM | POA: Diagnosis not present

## 2023-12-16 DIAGNOSIS — I5032 Chronic diastolic (congestive) heart failure: Secondary | ICD-10-CM | POA: Diagnosis not present

## 2023-12-16 DIAGNOSIS — E876 Hypokalemia: Secondary | ICD-10-CM

## 2023-12-16 DIAGNOSIS — E1151 Type 2 diabetes mellitus with diabetic peripheral angiopathy without gangrene: Secondary | ICD-10-CM | POA: Diagnosis not present

## 2023-12-16 DIAGNOSIS — I509 Heart failure, unspecified: Secondary | ICD-10-CM | POA: Diagnosis not present

## 2023-12-16 DIAGNOSIS — I471 Supraventricular tachycardia, unspecified: Secondary | ICD-10-CM

## 2023-12-16 DIAGNOSIS — J449 Chronic obstructive pulmonary disease, unspecified: Secondary | ICD-10-CM | POA: Diagnosis not present

## 2023-12-16 DIAGNOSIS — E119 Type 2 diabetes mellitus without complications: Secondary | ICD-10-CM | POA: Insufficient documentation

## 2023-12-16 DIAGNOSIS — E785 Hyperlipidemia, unspecified: Secondary | ICD-10-CM

## 2023-12-16 LAB — D-DIMER, QUANTITATIVE: D-Dimer, Quant: 1.06 ug{FEU}/mL — ABNORMAL HIGH (ref 0.00–0.50)

## 2023-12-16 LAB — CBC
HCT: 38.2 % (ref 36.0–46.0)
Hemoglobin: 12.4 g/dL (ref 12.0–15.0)
MCH: 29 pg (ref 26.0–34.0)
MCHC: 32.5 g/dL (ref 30.0–36.0)
MCV: 89.5 fL (ref 80.0–100.0)
Platelets: 123 K/uL — ABNORMAL LOW (ref 150–400)
RBC: 4.27 MIL/uL (ref 3.87–5.11)
RDW: 14 % (ref 11.5–15.5)
WBC: 9 K/uL (ref 4.0–10.5)
nRBC: 0 % (ref 0.0–0.2)

## 2023-12-16 LAB — BASIC METABOLIC PANEL WITH GFR
Anion gap: 12 (ref 5–15)
BUN: 8 mg/dL (ref 8–23)
CO2: 27 mmol/L (ref 22–32)
Calcium: 9 mg/dL (ref 8.9–10.3)
Chloride: 102 mmol/L (ref 98–111)
Creatinine, Ser: 0.94 mg/dL (ref 0.44–1.00)
GFR, Estimated: >60 mL/min (ref >60)
Glucose, Bld: 158 mg/dL — ABNORMAL HIGH (ref 70–99)
Potassium: 2.8 mmol/L — ABNORMAL LOW (ref 3.5–5.1)
Sodium: 141 mmol/L (ref 135–145)

## 2023-12-16 LAB — MAGNESIUM: Magnesium: 1.4 mg/dL — ABNORMAL LOW (ref 1.7–2.4)

## 2023-12-16 LAB — BRAIN NATRIURETIC PEPTIDE: B Natriuretic Peptide: 132.4 pg/mL — ABNORMAL HIGH (ref 0.0–100.0)

## 2023-12-16 MED ORDER — IPRATROPIUM-ALBUTEROL 0.5-2.5 (3) MG/3ML IN SOLN
6.0000 mL | Freq: Once | RESPIRATORY_TRACT | Status: AC
  Administered 2023-12-16: 6 mL via RESPIRATORY_TRACT
  Filled 2023-12-16: qty 6

## 2023-12-16 MED ORDER — MAGNESIUM OXIDE -MG SUPPLEMENT 400 (240 MG) MG PO TABS
400.0000 mg | ORAL_TABLET | Freq: Once | ORAL | Status: AC
  Administered 2023-12-16: 400 mg via ORAL
  Filled 2023-12-16: qty 1

## 2023-12-16 MED ORDER — PREDNISONE 20 MG PO TABS
60.0000 mg | ORAL_TABLET | Freq: Once | ORAL | Status: AC
  Administered 2023-12-16: 60 mg via ORAL
  Filled 2023-12-16: qty 3

## 2023-12-16 MED ORDER — IOHEXOL 350 MG/ML SOLN
75.0000 mL | Freq: Once | INTRAVENOUS | Status: AC | PRN
Start: 2023-12-16 — End: 2023-12-16
  Administered 2023-12-16: 75 mL via INTRAVENOUS

## 2023-12-16 MED ORDER — POTASSIUM CHLORIDE CRYS ER 20 MEQ PO TBCR
40.0000 meq | EXTENDED_RELEASE_TABLET | Freq: Once | ORAL | Status: AC
  Administered 2023-12-16: 40 meq via ORAL
  Filled 2023-12-16: qty 2

## 2023-12-16 NOTE — Telephone Encounter (Signed)
 Patient called to report that she has been experiencing shortness of breath both at rest and with exertion. She denies swelling but stated the symptoms are similar to when she previously had fluid in her lungs. She reported that the last night she experienced difficulty breathing while lying down, her CPAP machine provided some relief and allowed her to sleep. The patient also mentioned that she was previously prescribed Lasix , which helped with fluid buildup.  An appointment is scheduled for today, 12/16/23, at 11:20 AM with Tylene Lunch for further evaluation.

## 2023-12-16 NOTE — Progress Notes (Addendum)
 Cardiology Office Note   Date:  12/16/2023  ID:  Katelyn Cooper, DOB 08-Aug-1955, MRN [id number] PCP: Katelyn Nancyann BRAVO, MD  [facility] Providers Cardiologist:  Lonni Hanson, MD     History of Present Illness Katelyn Cooper is a 68 y.o. female with a past medical history of nonobstructive coronary artery disease, peripheral arterial disease status post left iliac stenting, HFpEF, mild to moderate mitral regurgitation, vasovagal syncope, type 2 diabetes, thoracic outlet syndrome status post bilateral rib resections (1990), hyperlipidemia, COPD with ongoing tobacco use, GERD complicated by esophageal stricture, OSA on CPAP, cervical radiculopathy, anxiety and depression, who presents today for complaints of shortness of breath.   Previous left heart catheterization 01/2016 demonstrated 40% stenosis in the proximal aspect of the large first septal perforator along with 30% OM1 stenosis and 30% proximal and mid RCA stenosis.  R/LHC in 10/2017 showed 30% stenosis of the first septal perforator, OM1 30% stenosis, sequential 30% proximal and mid RCA stenosis, upper normal filling pressures and normal pulmonary artery pressure with moderately reduced cardiac output as outlined.  Lexiscan  MPI from 03/2019 was low risk without evidence of ischemia or scar, coronary artery calcification noted.  LVEF greater than 65%.  Echocardiogram from 04/2019 showed an EF of 60-65%, G1 DD and elevated filling pressures, normal RV size and systolic function, no significant valvular abnormality and normal CVP.  She was evaluated for syncope 05/2020 that occurred after stubbing her toe with associated severe pain.  Outpatient cardiac monitor 05/2020 showed predominant rhythm is sinus with 5 runs of PSVT with the longest episode lasting 18.8 seconds as well as rare PACs and PVCs.  Episode of syncope was felt to be precipitated by vasovagal episode complicated by orthostatic hypotension in the setting of dehydration.  She  was admitted to the hospital 09/2021 with lower extremity weakness and noted decreased oral intake and dysphagia following a cervical fusion in the context of cervical collar.  High-sensitivity troponin was negative x 2.  She was evaluated 10/2021 reported several episodes of profound lower extremity weakness leading up to her hospital admission that was felt to be noncardiac in etiology.  Echocardiogram in 12/2021 showed an EF of 55 to 60%, septal wall hypokinesis, consistent with known left bundle branch block, normal LV diastolic function parameters, normal RV systolic function, mildly elevated PASP estimated at 36.4 mmHg, mild to moderate mitral regurgitation.  Zio patch at that time showed predominant rhythm that was sinus with an average heart rate of 85 bpm, a single atrial run lasting 5 beats, rare PACs and PVCs no sustained arrhythmias or prolonged pauses.  She was seen in office 05/2022 noted bilateral thigh discomfort and weakness.  She continues to smoke 1 pack/day and subsequent ABIs in 07/2022 were normal and the previously placed left common iliac stent was patent.  She was admitted to the hospital in 08/2023 for generalized weakness after returning from [place] .  She was found to have AKI with serum creatinine 1.91 and was hypercalcemic with a calcium  of 14.2.  CT showed no acute intracranial pathology.  CT of the abdomen/pelvis was compatible with esophagitis and a distended segment.  Echo revealed an LVEF 55 to 60%, G1 DD, normal right ventricular systolic function and cavity size, moderate mitral valve regurgitation.  She had symptomatic improvement in AKI with the holding of furosemide  and IV fluids.  When she was evaluated in clinic 08/13/2023 she continued to note generalized malaise and fatigue following her hospitalization.  She also had 2 near  falls versus near syncopal episodes.  Zio patch in 08/2023 showed predominant rhythm of sinus with 5 episodes of SVT lasting up to 10 beats.  She was last  evaluated in clinic 09/20/2023 stating for the cardiac perspective she was doing well.  Overall feels much better compared to her last visit felt back to baseline.  She continued to walk 2 miles 3 days a week without cardiac limitation or symptoms of claudication.  There were no changes made to her medication regimen and no further testing that was scheduled at that time.  She called the triage line this morning with abrupt onset of shortness of breath.  She stated that she could breathe easier within the morning and it gradually worsened into the afternoon and evening.  If it had not been for her CPAP she would not have been able to sleep yesterday evening and last night.  She also has noted that she can hear herself wheezing which is different.  Her weight has not changed but she does tend noted a large intake of oral fluids as of late.  She stated that previously during her hospitalization she had an AKI and was told to pause taking her furosemide .  She stated that recently her potassium was restarted but she has not had any recent lab done to see what her levels were.  She has noted as of late that she has had a cough and last evening had elevated head of her bed.  She denies any chest pain, swelling, lightheadedness or dizziness.  States that she has been compliant with her medications.  Denied any prior hospitalizations or visits to the emergency department since her last discharge.  ROS: 10 point review of system has been reviewed and considered negative except was been listed in the HPI  Studies Reviewed EKG Interpretation Date/Time:  Monday December 16 2023 11:26:34 EDT Ventricular Rate:  81 PR Interval:  130 QRS Duration:  74 QT Interval:  396 QTC Calculation: 460 R Axis:   49  Text Interpretation: Normal sinus rhythm ST & T wave abnormality, consider anterolateral ischemia When compared with ECG of 13-Aug-2023 08:14, Left bundle branch block is no longer Present Confirmed by Gerard Frederick  ([id number]) on 12/16/2023 1:41:34 PM    Zio patch 08/2023:   The patient was monitored for 13 days, 23 hours.   The predominant rhythm was sinus with an average rate of 81 bpm (range 62-130 bpm in sinus).   There were rare PACs and PVCs.   Five supraventricular runs were observed, lasting up to 10 beats with a maximum rate of 162 bpm.   No sustained arrhythmia or prolonged pause was seen.   Patient triggered event corresponded to sinus tachycardia.   Predominantly sinus rhythm with rare PACs and PVCs as well as a few brief supraventricular runs.   2D echo 08/07/2023: 1. Left ventricular ejection fraction, by estimation, is 55 to 60%. Left  ventricular ejection fraction by PLAX is 58 %. The left ventricle has  normal function. The left ventricle demonstrates regional wall motion  abnormalities (basal to mid septal wall  hypokinesis). Left ventricular diastolic parameters are consistent with  Grade I diastolic dysfunction (impaired relaxation).   2. Right ventricular systolic function is normal. The right ventricular  size is normal. There is normal pulmonary artery systolic pressure. The  estimated right ventricular systolic pressure is 27.1 mmHg.   3. The mitral valve is normal in structure. Moderate mitral valve  regurgitation. No evidence of mitral stenosis.  4. The aortic valve is tricuspid. Aortic valve regurgitation is not  visualized. No aortic stenosis is present.   5. The inferior vena cava is normal in size with greater than 50%  respiratory variability, suggesting right atrial pressure of 3 mmHg.    2D echo 12/01/2021: 1. Left ventricular ejection fraction, by estimation, is 55 to 60%. The  left ventricle has normal function. Septal wall hypokinesis consistent  with bundle branch block. Left ventricular diastolic parameters were  normal.   2. Right ventricular systolic function is normal. The right ventricular  size is normal. There is mildly elevated pulmonary artery systolic   pressure. The estimated right ventricular systolic pressure is 36.4 mmHg.   3. The mitral valve is normal in structure. Mild to moderate mitral valve  regurgitation. No evidence of mitral stenosis.   4. The aortic valve is normal in structure. Aortic valve regurgitation is  not visualized. No aortic stenosis is present.   5. The inferior vena cava is normal in size with greater than 50%  respiratory variability, suggesting right atrial pressure of 3 mmHg.    Zio patch 10/2021:   The patient was monitored for 13 days, 14 hours.   The predominant rhythm was sinus with an average rate of 85 bpm (range 66-132 bpm).   There were rare PACs and PVCs.  A single atrial run lasting 5 beats occurred, with a maximum rate of 111 bpm.   No sustained arrhythmia or prolonged pause was identified.   There were no patient triggered events.   Predominantly sinus rhythm with rare PACs and PVCs as well as a single brief supraventricular run.  No significant arrhythmia observed.  Zio patch 05/2020: The patient was monitored for 13 days, 19 hours. The predominant rhythm was sinus with an average rate of 84 bpm (range 65-143 bpm in sinus). There were rare PAC's and PVC's. Five atrial runs lasting up to 18.8 seconds occurred with a maximum rate of 156 bpm. No sustained arrhythmia or prolonged pause was identified. There were no patient triggered events.   Predominantly sinus rhythm with a few episodes of PSVT as well as rare PAC's and PVC's.   2D echo 04/14/2019: 1. Left ventricular ejection fraction, by visual estimation, is 60 to  65%. The left ventricle has normal function. There is no left ventricular  hypertrophy.   2. Elevated left atrial pressure.   3. Left ventricular diastolic parameters are consistent with Grade I  diastolic dysfunction (impaired relaxation).   4. The left ventricle has no regional wall motion abnormalities.   5. Global right ventricle has normal systolic function.The right   ventricular size is normal. No increase in right ventricular wall  thickness.   6. Left atrial size was normal.   7. Right atrial size was normal.   8. The mitral valve is normal in structure. Trivial mitral valve  regurgitation. No evidence of mitral stenosis.   9. The tricuspid valve is not well visualized.  10. The aortic valve was not well visualized. Aortic valve regurgitation  is not visualized. No evidence of aortic valve sclerosis or stenosis.  11. The pulmonic valve was not well visualized. Pulmonic valve  regurgitation is not visualized.  12. TR signal is inadequate for assessing pulmonary artery systolic  pressure.  13. The inferior vena cava is normal in size with greater than 50%  respiratory variability, suggesting right atrial pressure of 3 mmHg.  14. The interatrial septum was not well visualized.   In comparison to the  previous echocardiogram(s): Anterior/anterior septum  hypokinesis seen on previous test 11/06/2017. EF 50-55%.   Lexiscan  MPI 03/20/2019: Pharmacological myocardial perfusion imaging study with no significant  ischemia Normal wall motion, EF estimated at 85% No EKG changes concerning for ischemia at peak stress or in recovery. CT attenuation correction images with mild coronary calcification at bifurcation of LAD/left circumflex, moderate calcification of the descending aorta Resting EKG with sinus rhythm, left bundle branch block Low risk scan   [facility] 11/08/2017: Ost 1st Sept lesion is 30% stenosed. 1st Mrg lesion is 30% stenosed. Prox RCA lesion is 30% stenosed. Mid RCA lesion is 30% stenosed. The left ventricular systolic function is normal. LV end diastolic pressure is mildly elevated.   1.  Mild nonobstructive coronary artery disease. 2.  Normal LV systolic function with an EF of 55% with borderline mid anterior wall hypokinesis likely due to IVCD. 3.  Right heart catheterization showed high normal filling pressures, normal pulmonary pressure  and moderately reduced cardiac output.  Pulmonary capillary wedge pressure was 11 mmHg, PA pressure was 27 over 11 mmHg, cardiac output was 3.1 with a cardiac index of 1.8.   Recommendations: Continue medical therapy for diastolic heart failure.  Volume status has improved close to normal and thus I am going to switch furosemide  to oral.   2D echo 11/06/2017: - Left ventricle: The cavity size was normal. Wall thickness was    normal. Systolic function was normal. The estimated ejection    fraction was in the range of 50% to 55%. There is hypokinesis of    the anteroseptal and anterior myocardium. Features are consistent    with a pseudonormal left ventricular filling pattern, with    concomitant abnormal relaxation and increased filling pressure    (grade 2 diastolic dysfunction). Doppler parameters are    consistent with high ventricular filling pressure.  - Mitral valve: There was mild regurgitation.  - Right ventricle: The cavity size was normal. Wall thickness was    normal. Systolic function was normal.  - Pulmonary arteries: Systolic pressure was mildly increased,    estimated to be 35 mm Hg.   Impressions:  - Compared with prior echo from 12/28/15, anterior/anteroseptal    hypokinesis is new.  LHC 01/12/2016: Conclusions: Mild, non-obstructive coronary artery disease.  There is slightly sluggish flow throughout the coronary arteries, which may reflect microvascular dysfunction. Upper normal to mildly elevated left ventricular filling pressure.   Recommendations Continue risk factor modification. Uptitrate metoprolol  and continue long-acting nitrate for possible component of microvascular dysfunction. Outpatient follow-up with myself.   2D echo 12/28/2015: - Procedure narrative: Transthoracic echocardiography. Image    quality was fair. The study was technically difficult.  - Left ventricle: The cavity size was normal. Wall thickness was    normal. Systolic function was  normal. The estimated ejection    fraction was in the range of 55% to 60%. Wall motion was normal    grossly; there were no regional wall motion abnormalities. Left    ventricular diastolic function parameters were normal for the    patient&'s age.   Nuclear stress test 12/23/2015: There was no ST segment deviation noted during stress. Defect 1: There is a small defect of mild severity present in the apical anterior and apex location. Findings suggestive small area of ischemia. However, shifting breast atteunation artifact can not be excluded. This is a low risk study. The left ventricular ejection fraction is normal (55-65%). TID was present. Risk Assessment/Calculations  Physical Exam VS:  BP 132/60   Pulse 81   Ht 5' 3 (1.6 m)   Wt 150 lb 12.8 oz (68.4 kg)   SpO2 98%   BMI 26.71 kg/m        Wt Readings from Last 3 Encounters:  12/16/23 150 lb 12.8 oz (68.4 kg)  12/05/23 152 lb (68.9 kg)  09/25/23 150 lb 3.2 oz (68.1 kg)    GEN: Well nourished, well developed in no acute distress NECK: No JVD; No carotid bruits CARDIAC: RRR, II/VI systolic murmur LLSB, without rubs or gallops RESPIRATORY:  Clear to auscultation with slightly diminished lower bases, forced expiratory wheezing noted ABDOMEN: Soft, non-tender, non-distended EXTREMITIES:  No edema; No deformity   ASSESSMENT AND PLAN Abrupt onset of shortness of breath that started last evening.  Patient has not been taking as needed furosemide  as it was stopped on her discharge from the hospital back in May due to AKI.  She denies any swelling or weight gain.  With abrupt onset shortness of breath she is being sent for labs today, BNP, D-dimer, BMP and a CBC.  She has been advised to take her furosemide  for the next 3 days and then back to as needed status unless she has called and advised otherwise.  She has also been advised that if any of her labs came back concerning she would be advised to report to the emergency  department next steps would be given to her via telephone.  Nonobstructive CAD without angina.  EKG shows no acute ischemic changes.  She is to continue with aggressive risk factor modification including restarting her aspirin  81 mg daily, atorvastatin  80 mg daily, ezetimibe  10 mg daily and Lopressor  12.5 mg twice daily.  No further ischemic testing has been ordered at this time.  HFpEF/pulmonary hypertension where she appears to be euvolemic and well compensated on exam and normal RVSP-echo on 08/2023.  Has no longer been taking as needed furosemide .  She has been sent for updated labs today with a recent history of AKI prior to escalating GDMT for HFpEF.  We did discuss initiating of spironolactone as she has a history of hypokalemia.  Medication changes have been deferred until return.  PAD status post left common iliac artery stenting with normal ABIs lower extremity arterial ultrasound with a patent stent/2024.  Restarted on aspirin  therapy today continue with statin therapy.  Hyperlipidemia with an LDL of 51.  Remains on atorvastatin  80 mg daily and ezetimibe  10 mg daily.  Yearly labs with PCP or in November.  PSVT that has been quiescent.  She remains on Lopressor  12.5 mg twice daily.  Chronic left bundle branch block not noted on EKG today.  Will continue to monitor with surveillance studies.  COPD with prior tobacco use without acute exacerbation patient states that she quit smoking in 11/2022.  Hypokalemia where she was recently started back on KCl 10 mEq daily.  Being sent for an updated BMP today.       Dispo: Patient to return to clinic to see MD/APP in 4 weeks or sooner if needed for reevaluation.  Signed, Deeana Atwater, NP

## 2023-12-16 NOTE — ED Provider Notes (Signed)
 Care of this patient assumed from prior physician at 1500 pending CTA of the chest, reevaluation, disposition. Please see prior physician note for further details.  Briefly, this is a 68 year old female with history of CAD, CHF, COPD presenting to the emergency department for evaluation of shortness of breath.  Seen by her cardiology office earlier today.  There, she reported worsened shortness of breath.  Initial plan had been for a 3-day course of outpatient Lasix .  She had labs performed demonstrating a low potassium of 2.8 as well as an elevated D-dimer at 1.06.  In the setting of this, she was directed to the ER for potassium repletion, CTA of the chest.   CTA of the chest pending at the time I assumed care.  This resulted without evidence of PE or other acute abnormality.  Reviewed outpatient documentation from cardiology, did recommend magnesium  level which has been ordered.  Patient was ordered for oral potassium repletion.  In obtaining further history she does note that she has been having some wheezing and does have a history of COPD.  Suspect her shortness of breath may be multifactorial.  Took oral Lasix  earlier today and reports that she has been peeing frequently since this, so we will hold off on further Lasix .  BNP only slightly elevated at 132 on labs from earlier today.  Patient reassessed after nebulizer treatment and steroids.  Reports feeling somewhat jittery, but not significantly improved breathing.  Considered discharge with steroid course, but with limited improvement and history of diabetes patient prefers to hold off which I do think is reasonable.  Magnesium  level did result low at 1.4, given oral repletion here.  Patient does report some ongoing shortness of breath, but is comfortable with discharge home with plan continue taking Lasix  as directed by her cardiology team.  I did recommend that she take a double dose of her potassium for the next 3 days (20 mill equivalents) while  taking the Lasix  to prevent worsening hypokalemia.  Do think patient is stable for discharge home.  Strict return precautions provided.  Patient discharged in stable condition.   Levander Slate, MD 12/16/23 1728

## 2023-12-16 NOTE — Telephone Encounter (Signed)
 Pt c/o Shortness Of Breath: STAT if SOB developed within the last 24 hours or pt is noticeably SOB on the phone  1. Are you currently SOB (can you hear that pt is SOB on the phone)? Yes   2. How long have you been experiencing SOB? Started yesterday  3. Are you SOB when sitting or when up moving around? Both   4. Are you currently experiencing any other symptoms? Mild cough

## 2023-12-16 NOTE — ED Triage Notes (Addendum)
 Pt comes with sob. Pt states this started on Saturday. Pt states worse yesterday. Pt states she went to her heart doc today and they did bloodwork and then called and told pt to come to eD bc dimer and something else was elevated.   Pt had labs done at pcp so no new labs

## 2023-12-16 NOTE — Discharge Instructions (Signed)
 You are seen in the ER for shortness of breath.  Your CT scan fortunately did not show signs of a blood clot.  Your potassium magnesium  levels were both low.  You were given supplementation here.  Please take 2 of your 10 mEq potassium pills daily for the next 3 days while you are taking Lasix  daily.  Follow-up with your cardiology team for further evaluation.  Return to the ER for new or worsening symptoms.

## 2023-12-16 NOTE — Progress Notes (Signed)
 With elevated D-dimer and significantly low potassium level recommend patient present to the emergency department for CTA of the chest to rule out PE and to replete potassium.  Would likely benefit from a magnesium  level as well.

## 2023-12-16 NOTE — Progress Notes (Signed)
 Called patient to advise that she go to the ED due to low potassium and positive d-Dimer- patient verbalized understanding and agreement with plan

## 2023-12-16 NOTE — Patient Instructions (Signed)
 Medication Instructions:  Your physician recommends the following medication changes.  START TAKING: Aspirin  81 mg daily Lasix  20 mg x 3 days then return to as needed   *If you need a refill on your cardiac medications before your next appointment, please call your pharmacy*  Lab Work: Your provider would like for you to have following labs drawn today BNP, BMP, CBC, D-dimer.   If you have labs (blood work) drawn today and your tests are completely normal, you will receive your results only by: MyChart Message (if you have MyChart) OR A paper copy in the mail If you have any lab test that is abnormal or we need to change your treatment, we will call you to review the results.  Testing/Procedures: No test ordered today   Follow-Up: At [facility], you and your health needs are our priority.  As part of our continuing mission to provide you with exceptional heart care, our providers are all part of one team.  This team includes your primary Cardiologist (physician) and Advanced Practice Providers or APPs (Physician Assistants and Nurse Practitioners) who all work together to provide you with the care you need, when you need it.  Your next appointment:   4 week(s)  Provider:   You may see Lonni Hanson, MD or one of the following Advanced Practice Providers on your designated Care Team:   Lonni Meager, NP Lesley Maffucci, PA-C Bernardino Bring, PA-C Cadence [place], PA-C Tylene Lunch, NP Barnie Hila, NP

## 2023-12-16 NOTE — Progress Notes (Signed)
 BNP not significantly elevated.

## 2023-12-16 NOTE — ED Provider Notes (Signed)
 [facility] Provider Note    Event Date/Time   First MD Initiated Contact with Patient 12/16/23 1442     (approximate)   History   Shortness of Breath   HPI  Katelyn Cooper is a 68 y.o. female with a history of CAD, CHF, diabetes who was sent from cardiology office for shortness of breath, elevated D-dimer.  Patient reports her breathing was quite labored last night, she thinks is related to likely fluid in her lungs which she has had in the past.  She is no longer on Lasix  however.     Physical Exam   Triage Vital Signs: ED Triage Vitals  Encounter Vitals Group     BP 12/16/23 1404 (!) 149/67     Girls Systolic BP Percentile --      Girls Diastolic BP Percentile --      Boys Systolic BP Percentile --      Boys Diastolic BP Percentile --      Pulse Rate 12/16/23 1404 93     Resp 12/16/23 1404 18     Temp 12/16/23 1404 98 F (36.7 C)     Temp src --      SpO2 12/16/23 1356 94 %     Weight 12/16/23 1438 68.4 kg (150 lb 12.7 oz)     Height 12/16/23 1438 1.6 m (5' 3)     Head Circumference --      Peak Flow --      Pain Score 12/16/23 1356 0     Pain Loc --      Pain Education --      Exclude from Growth Chart --     Most recent vital signs: Vitals:   12/16/23 1356 12/16/23 1404  BP:  (!) 149/67  Pulse:  93  Resp:  18  Temp:  98 F (36.7 C)  SpO2: 94% 99%     General: Awake, no distress.  CV:  Good peripheral perfusion.  Resp:  Normal effort.  Bibasilar rales Abd:  No distention.  Other:  No lower extremity edema or spine   ED Results / Procedures / Treatments   Labs (all labs ordered are listed, but only abnormal results are displayed) Labs Reviewed - No data to display   EKG  ED ECG REPORT I, Lamar Price, the attending physician, personally viewed and interpreted this ECG.  Date: 12/16/2023  Rhythm: normal sinus rhythm QRS Axis: normal Intervals: Left bundle branch block ST/T Wave abnormalities:  normal Narrative Interpretation: no evidence of acute ischemia    RADIOLOGY     PROCEDURES:  Critical Care performed:   Procedures   MEDICATIONS ORDERED IN ED: Medications - No data to display   IMPRESSION / MDM / ASSESSMENT AND PLAN / ED COURSE  I reviewed the triage vital signs and the nursing notes. Patient's presentation is most consistent with acute presentation with potential threat to life or bodily function.  Patient presents with shortness of breath as detailed above.  She reports significant work of breathing last night, improved when she used her CPAP.  Differential includes pulmonary edema/CHF, she does have an elevated D-dimer, CTA pending  Labs were drawn earlier today, no repeat labs necessary at this time.  I have asked my colleague to follow-up on CT scan, anticipate admission for shortness of breath.        FINAL CLINICAL IMPRESSION(S) / ED DIAGNOSES   Final diagnoses:  SOB (shortness of breath)     Rx /  DC Orders   ED Discharge Orders     None        Note:  This document was prepared using Dragon voice recognition software and may include unintentional dictation errors.   Arlander Charleston, MD 12/16/23 1455

## 2023-12-16 NOTE — ED Notes (Signed)
 Patient is calling husband for a ride. Patient appears anxious, hands are shaking. Patient states it began after the neb treatment.

## 2023-12-17 ENCOUNTER — Encounter: Payer: Self-pay | Admitting: Internal Medicine

## 2023-12-17 DIAGNOSIS — Z79899 Other long term (current) drug therapy: Secondary | ICD-10-CM

## 2023-12-17 NOTE — Telephone Encounter (Signed)
 Recommend that she limit her overall fluid intake to between 60 and 64 ounces per day. Also recommend repeat BMP and Magnesium  in 1 week with the low results yesterday.

## 2023-12-24 DIAGNOSIS — Z79899 Other long term (current) drug therapy: Secondary | ICD-10-CM | POA: Diagnosis not present

## 2023-12-25 ENCOUNTER — Telehealth: Payer: Self-pay | Admitting: Internal Medicine

## 2023-12-25 DIAGNOSIS — E876 Hypokalemia: Secondary | ICD-10-CM

## 2023-12-25 LAB — BASIC METABOLIC PANEL WITH GFR
BUN/Creatinine Ratio: 9 — ABNORMAL LOW (ref 12–28)
BUN: 9 mg/dL (ref 8–27)
CO2: 19 mmol/L — ABNORMAL LOW (ref 20–29)
Calcium: 9 mg/dL (ref 8.7–10.3)
Chloride: 97 mmol/L (ref 96–106)
Creatinine, Ser: 1.04 mg/dL — ABNORMAL HIGH (ref 0.57–1.00)
Glucose: 280 mg/dL — ABNORMAL HIGH (ref 70–99)
Potassium: 2.9 mmol/L — CL (ref 3.5–5.2)
Sodium: 138 mmol/L (ref 134–144)
eGFR: 58 mL/min/1.73 — ABNORMAL LOW (ref >59)

## 2023-12-25 LAB — MAGNESIUM: Magnesium: 1.3 mg/dL — ABNORMAL LOW (ref 1.6–2.3)

## 2023-12-25 MED ORDER — POTASSIUM CHLORIDE ER 20 MEQ PO TBCR: 20.0000 meq | EXTENDED_RELEASE_TABLET | Freq: Every day | ORAL | 3 refills | Status: DC

## 2023-12-25 NOTE — Telephone Encounter (Signed)
Critical lab results

## 2023-12-25 NOTE — Telephone Encounter (Signed)
 Received call for Critical Potassium of 2.9. Will forward to DOD.

## 2023-12-25 NOTE — Telephone Encounter (Signed)
 Received the following from Dr. Arida.  Increase potassium chloride  to 20 mEq twice daily for 3 days then down to 20 meq once daily after that. Recheck basic metabolic profile on Monday   Called patient and notified her of the recommendations. Patient verbalizes understanding. Prescription sent to preferred pharmacy. Order placed for BMP

## 2023-12-26 ENCOUNTER — Ambulatory Visit: Payer: Self-pay | Admitting: Cardiology

## 2023-12-26 NOTE — Progress Notes (Signed)
 Managed by DOD on 12/25/2023.

## 2023-12-30 ENCOUNTER — Other Ambulatory Visit: Payer: Self-pay

## 2023-12-30 DIAGNOSIS — E876 Hypokalemia: Secondary | ICD-10-CM

## 2023-12-31 LAB — BASIC METABOLIC PANEL WITH GFR
BUN/Creatinine Ratio: 9 — ABNORMAL LOW (ref 12–28)
BUN: 10 mg/dL (ref 8–27)
CO2: 18 mmol/L — ABNORMAL LOW (ref 20–29)
Calcium: 9.8 mg/dL (ref 8.7–10.3)
Chloride: 100 mmol/L (ref 96–106)
Creatinine, Ser: 1.07 mg/dL — ABNORMAL HIGH (ref 0.57–1.00)
Glucose: 252 mg/dL — ABNORMAL HIGH (ref 70–99)
Potassium: 4 mmol/L (ref 3.5–5.2)
Sodium: 138 mmol/L (ref 134–144)
eGFR: 56 mL/min/1.73 — ABNORMAL LOW (ref >59)

## 2024-01-01 ENCOUNTER — Ambulatory Visit: Payer: Self-pay | Admitting: Cardiovascular Disease

## 2024-01-13 ENCOUNTER — Encounter: Payer: Self-pay | Admitting: Physician Assistant

## 2024-01-13 ENCOUNTER — Ambulatory Visit: Attending: Physician Assistant | Admitting: Physician Assistant

## 2024-01-13 VITALS — BP 100/52 | HR 78 | Ht 63.0 in | Wt 152.1 lb

## 2024-01-13 DIAGNOSIS — R1310 Dysphagia, unspecified: Secondary | ICD-10-CM

## 2024-01-13 DIAGNOSIS — J449 Chronic obstructive pulmonary disease, unspecified: Secondary | ICD-10-CM

## 2024-01-13 DIAGNOSIS — I447 Left bundle-branch block, unspecified: Secondary | ICD-10-CM

## 2024-01-13 DIAGNOSIS — I340 Nonrheumatic mitral (valve) insufficiency: Secondary | ICD-10-CM | POA: Diagnosis not present

## 2024-01-13 DIAGNOSIS — I272 Pulmonary hypertension, unspecified: Secondary | ICD-10-CM | POA: Diagnosis not present

## 2024-01-13 DIAGNOSIS — E785 Hyperlipidemia, unspecified: Secondary | ICD-10-CM

## 2024-01-13 DIAGNOSIS — Z79899 Other long term (current) drug therapy: Secondary | ICD-10-CM

## 2024-01-13 DIAGNOSIS — I5032 Chronic diastolic (congestive) heart failure: Secondary | ICD-10-CM

## 2024-01-13 DIAGNOSIS — I2510 Atherosclerotic heart disease of native coronary artery without angina pectoris: Secondary | ICD-10-CM

## 2024-01-13 DIAGNOSIS — I739 Peripheral vascular disease, unspecified: Secondary | ICD-10-CM

## 2024-01-13 DIAGNOSIS — I471 Supraventricular tachycardia, unspecified: Secondary | ICD-10-CM

## 2024-01-13 NOTE — Patient Instructions (Signed)
 Medication Instructions:  Your physician recommends that you continue on your current medications as directed. Please refer to the Current Medication list given to you today.   *If you need a refill on your cardiac medications before your next appointment, please call your pharmacy*  Lab Work: Your provider would like for you to have following labs drawn today BMeT and Mag level.   If you have labs (blood work) drawn today and your tests are completely normal, you will receive your results only by: MyChart Message (if you have MyChart) OR A paper copy in the mail If you have any lab test that is abnormal or we need to change your treatment, we will call you to review the results.  Follow-Up: At [facility], you and your health needs are our priority.  As part of our continuing mission to provide you with exceptional heart care, our providers are all part of one team.  This team includes your primary Cardiologist (physician) and Advanced Practice Providers or APPs (Physician Assistants and Nurse Practitioners) who all work together to provide you with the care you need, when you need it.  Your next appointment:   2 month(s)  Provider:   You may see Lonni Hanson, MD or Bernardino Bring, PA-C

## 2024-01-13 NOTE — Progress Notes (Signed)
 Cardiology Office Note    Date:  01/13/2024   ID:  Katelyn Cooper, DOB [birth year], MRN [id number]  PCP:  [name], MD  Cardiologist:  Lonni Hanson, MD  Electrophysiologist:  None   Chief Complaint: ED follow-up  History of Present Illness:   Katelyn Cooper is a 68 y.o. female with history of nonobstructive CAD, PAD status post left iliac stenting, HFpEF, mild to moderate mitral regurgitation, vasovagal syncope, DM2, thoracic outlet syndrome status post bilateral rib resections in 1990, HLD, COPD with ongoing tobacco use, GERD complicated by esophageal stricture, OSA on CPAP, cervical radiculopathy, and anxiety/depression who presents for ED follow-up of dyspnea.  LHC from 01/2016 demonstrated 40% stenosis in the proximal aspect of a large first septal perforator along with 30% OM1 stenosis and 30% proximal and mid RCA stenoses.  R/LHC in 10/2017 showed 30% stenosis at the first septal perforator, OM1 30% stenosis, sequential 30% proximal and mid RCA stenoses, upper normal filling pressures and normal pulmonary artery pressure with a moderately reduced cardiac output as outlined below.  Lexiscan  MPI from 03/2019 was low risk without evidence of ischemia or scar coronary artery calcification noted.  LVEF greater than 65%.  Echo from 04/2019 showed an EF of 60 to 65%, grade 1 diastolic dysfunction with elevated filling pressures, normal RV size and systolic function, no significant valvular abnormality, and normal CVP.   She was evaluated for syncope in 05/2020 that occurred after stubbing her toe with associated severe pain.  Outpatient cardiac monitor in 05/2020 showed a predominant rhythm of sinus with 5 runs of PSVT with the longest episode lasting 18.8 seconds as well as rare PACs and PVCs.  Episode of syncope was felt to be precipitated by vasovagal episode complicated by orthostatic hypotension in the setting of dehydration.   She was admitted to the hospital in 09/2021 with lower  extremity weakness with noted decreased oral intake and dysphagia following a cervical fusion in the context of cervical collar.  High-sensitivity troponin negative x2.  Blood cultures negative.  CBC consistent with hemoconcentration.  She was evaluated by orthopedics and PT with recommendation for home health PT.   She was seen in the office in 10/2021 and reported several episodes of profound lower extremity weakness leading up to her hospital admission, which was felt to be noncardiac in etiology.  Echo in 12/2021 showed an EF of 55 to 60%, septal wall hypokinesis consistent with known left bundle branch block, normal LV diastolic function parameters, normal RV systolic function and ventricular cavity size, mildly elevated PASP estimated at 36.4 mmHg, mild to moderate mitral regurgitation, and an estimated right atrial pressure of 3 mmHg.  Zio patch at that time showed a predominant rhythm of sinus with an average rate of 85 bpm (range 66 to 132 bpm), a single atrial run lasting 5 beats was noted, rare PACs and PVCs, no sustained arrhythmias or prolonged pauses.  No patient triggered events.  She was seen in the office in 05/2022 and noted bilateral thigh discomfort and weakness.  She continued to smoke 1 pack/day.  Subsequent ABIs in 07/2022 were normal and the previously placed left common iliac artery stent was patent.   She was admitted to the hospital in 08/2023 with generalized weakness after returning from [place]  with associated achiness and emesis.  She was found to have AKI with a serum creatinine of 1.91 and was hypercalcemic with a calcium  of 14.2.  CT showed no acute intracranial pathology.  CT of the  abdomen/pelvis was compatible with esophagitis and with a distended segment.  Echo showed an EF of 55 to 60%, basal to mid septal wall hypokinesis, grade 1 diastolic dysfunction, normal RV systolic function, ventricular cavity size, and RVSP, moderate mitral valve regurgitation, and an estimated right  atrial pressure of 3 mmHg.  She had symptomatic improvement in AKI with the holding of furosemide  and IV fluids.   She was seen in the office on 08/13/2023 and continued to note generalized malaise and fatigue following her hospitalization.  She also had 2 near falls versus near syncopal episodes.  Zio patch in 08/2023 showed a predominant rhythm of sinus with 5 episodes of SVT lasting up to 10 beats, and rare PACs and PVCs.  Patient triggered event corresponded to sinus tachycardia.  ABIs normal bilaterally in 10/2023 with vascular ultrasound demonstrating patent iliac stent.  She was seen in the office on 12/16/2023 with abrupt onset of shortness of breath without frank angina.  Labs obtained at that time showed a mildly elevated D-dimer and hypokalemia.  In the setting she was advised to go to the ED.  She was seen in the ED on 12/16/2023 for evaluation of abrupt onset of shortness of breath and elevated D-dimer.  EKG showed sinus rhythm with LBBB.  BNP 132.  CTA chest negative for PE or other acute chest finding with aortic atherosclerosis, emphysema, and stable mediastinal and hilar lymph nodes noted.  At that time, she reported good urine output with added furosemide .  Her dyspnea was improving.  Potassium was repleted and she was advised to follow-up with cardiology.  She comes in today accompanied by her husband is doing well from a cardiac perspective.  Dyspnea is improved.  No symptoms of frank chest pain.  No dizziness, presyncope, or syncope.  No palpitations.  No lower extremity swelling, abdominal distention, or progressive orthopnea.  She reports leading up to the above she was drinking large amounts of watermelon flavored water and feels like this may have contributed.  She continues to have issues with significant dysphagia and has previously required esophageal dilatation.  She is establishing with a new gastroenterologist next month for reevaluation of recurrent dysphagia.  Weight has been  stable.  Currently taking furosemide  20 mg every other day with potassium 40 mEq daily.  Having to crush pills due to dysphagia.   Labs independently reviewed: 12/2023 - BUN 10, serum creatinine 1.07, potassium 4.0, magnesium  1.3, BNP 132, D-dimer 1.06, Hgb 12.4, PLT 123 11/2023 - A1c 7.9 08/2023 - albumin 3.6, AST/ALT normal 02/2023 - TC 126, TG 232, HDL 38, LDL 51  09/2021 - TSH normal  Past Medical History:  Diagnosis Date   (HFpEF) heart failure with preserved ejection fraction (HCC)    a. 12/2015 Echo: EF 55-60%, nl RV size/fxn, no significant valvular abnormalities; b. 10/2017 Echo: EF 50-55%, antsept, ant HK. Gr2 DD. Mild MR. Nl RV fxn. PASP .   Actinic keratosis    Anxiety    Basal cell carcinoma 08/08/2017   L nose above mid nasal alar groove   CHF (congestive heart failure) (HCC)    Depression    Diabetes type 2, controlled (HCC)    diet-controlled   Esophageal stricture    GERD (gastroesophageal reflux disease)    Hiatal hernia    History of chicken pox    Hyperlipidemia    Hypertension    Internal hemorrhoids    Non-obstructive CAD (coronary artery disease)    a. 12/2015 MV: apical ant and apical  reversible defect. EF 55-65%; b. 01/2016 Cath: LM nl, LAD nl, SP1 40ost, LCX nl, OM1 30, RCA 30p/m, EDP 15-53mmHg; c. 10/2017 Cath: LM nl, LAD nl, SP1 30ost, LCX nl, OM1 30, RCA 30p/m. EF 55%.   Obstructive sleep apnea    PAD (peripheral artery disease)    a. 1990's s/p prior LCIA stenting; b. 12/2015 ABI: R 1.05, L 1.03.   Panic disorder     Past Surgical History:  Procedure Laterality Date   AORTA SURGERY  1999   stent placement; ? left iliac artery intervention   APPENDECTOMY  1998   BASAL CELL CARCINOMA EXCISION     C5 - fusion N/A 10/10/2021   CARDIAC CATHETERIZATION N/A 01/12/2016   Procedure: Left Heart Cath and Coronary Angiography;  Surgeon: Lonni Hanson, MD;  Location: [facility] INVASIVE CV LAB;  Service: Cardiovascular;  Laterality: N/A;   COLONOSCOPY  07/17/2019    FOOT SURGERY     Right   pap smear  03/2010   Done by Dr. Jon Rummer, normal   REFRACTIVE SURGERY     right   RIB RESECTION  8009,8006   RIGHT/LEFT HEART CATH AND CORONARY ANGIOGRAPHY N/A 11/08/2017   Procedure: RIGHT/LEFT HEART CATH AND CORONARY ANGIOGRAPHY;  Surgeon: Darron Deatrice LABOR, MD;  Location: [facility] INVASIVE CV LAB;  Service: Cardiovascular;  Laterality: N/A;   SHOULDER SURGERY  2005   left   UPPER GASTROINTESTINAL ENDOSCOPY  07/17/2019   UPPER GI ENDOSCOPY  09/19/2011   Stricture at GE junction, dilated. Mild gastritis and duodenitis. Small hiatal hernia    Current Medications: Current Meds  Medication Sig   albuterol  (VENTOLIN  HFA) 108 (90 Base) MCG/ACT inhaler inhale 2 puffs by mouth every 6 hours if needed for wheezing or shortness of breath   aspirin  EC 81 MG tablet Take 1 tablet (81 mg total) by mouth daily.   atorvastatin  (LIPITOR) 80 MG tablet Take 1 tablet by mouth daily.   clobetasol ointment (TEMOVATE) 0.05 % PLEASE SEE ATTACHED FOR DETAILED DIRECTIONS   doxepin  (SINEQUAN ) 10 MG capsule Take 1 capsule (10 mg total) by mouth at bedtime as needed (insomnia).   ezetimibe  (ZETIA ) 10 MG tablet Take 1 tablet by mouth daily.   furosemide  (LASIX ) 20 MG tablet Take 1 tablet by mouth every other day and may take 1 tablet by mouth  as needed for increased fluid or weight gain on days that you would normally not take a tablet.   metFORMIN  (GLUCOPHAGE -XR) 500 MG 24 hr tablet Take 1 tablet by mouth twice daily.   metoprolol  tartrate (LOPRESSOR ) 25 MG tablet Take 0.5 tablets (12.5 mg total) by mouth 2 (two) times daily.   pantoprazole  (PROTONIX ) 40 MG tablet Take 1 tablet (40 mg total) by mouth daily. (Patient taking differently: Take 80 mg by mouth daily.)   potassium chloride  20 MEQ TBCR Take 1 tablet (20 mEq total) by mouth daily. (Patient taking differently: Take 40 mEq by mouth daily.)   QUEtiapine  (SEROQUEL ) 100 MG tablet Take 1 tablet (100 mg total) by mouth at  bedtime.   sucralfate  (CARAFATE ) 1 g tablet Take 1 tablet (1 g total) by mouth 4 (four) times daily -  with meals and at bedtime.   venlafaxine  XR (EFFEXOR -XR) 75 MG 24 hr capsule Take 1 capsule (75 mg total) by mouth daily with breakfast.    Allergies:   Mounjaro  [tirzepatide ], Lovastatin, Paroxetine hcl, and Lamotrigine   Social History   Socioeconomic History   Marital status: Married    Spouse name:  mark   Number of children: 0   Years of education: Not on file   Highest education level: 12th grade  Occupational History   Occupation: computer-retired    Employer: [employer]  Tobacco Use   Smoking status: Former    Current packs/day: 0.00    Types: Cigarettes    Quit date: 11/16/2022    Years since quitting: 1.1   Smokeless tobacco: Never  Vaping Use   Vaping status: Former  Substance and Sexual Activity   Alcohol use: No    Alcohol/week: 0.0 standard drinks of alcohol   Drug use: No   Sexual activity: Not Currently  Other Topics Concern   Not on file  Social History Narrative   Not on file   Social Drivers of Health   Financial Resource Strain: Low Risk  (11/07/2023)   Overall Financial Resource Strain (CARDIA)    Difficulty of Paying Living Expenses: Not hard at all  Food Insecurity: No Food Insecurity (11/07/2023)   Hunger Vital Sign    Worried About Running Out of Food in the Last Year: Never true    Ran Out of Food in the Last Year: Never true  Transportation Needs: No Transportation Needs (11/07/2023)   PRAPARE - Administrator, Civil Service (Medical): No    Lack of Transportation (Non-Medical): No  Physical Activity: Sufficiently Active (11/07/2023)   Exercise Vital Sign    Days of Exercise per Week: 3 days    Minutes of Exercise per Session: 130 min  Stress: No Stress Concern Present (11/07/2023)   [employer] of Occupational Health - Occupational Stress Questionnaire    Feeling of Stress: Not at all  Social Connections: Moderately Isolated  (11/07/2023)   Social Connection and Isolation Panel    Frequency of Communication with Friends and Family: Once a week    Frequency of Social Gatherings with Friends and Family: Three times a week    Attends Religious Services: Never    Active Member of Clubs or Organizations: No    Attends Engineer, structural: Not on file    Marital Status: Married     Family History:  The patient's family history includes Alcohol abuse in her father, mother, and another family member; Bipolar disorder in her sister and another family member; Breast cancer in her sister; Cancer in her mother; Depression in her mother and another family member; Drug abuse in her sister; Emphysema in her mother and sister; Esophageal cancer in her maternal aunt; Heart disease in her maternal grandmother, mother, and another family member; Lung cancer in her mother; Mood Disorder in her father; Stroke in her maternal grandmother; Vision loss in an other family member. There is no history of Colon cancer, Colon polyps, Rectal cancer, or Stomach cancer.  ROS:   12-point review of systems is negative unless otherwise noted in the HPI.   EKGs/Labs/Other Studies Reviewed:    Studies reviewed were summarized above. The additional studies were reviewed today:  ABI 10/23/2023: ABI/TBIToday's ABIToday's TBIPrevious ABIPrevious TBI  +-------+-----------+-----------+------------+------------+  Right 1.15       0.73       1.10        0.44          +-------+-----------+-----------+------------+------------+  Left  1.05       0.97       1.12        0.79          +-------+-----------+-----------+------------+------------+   Bilateral ABIs appear essentially unchanged. Bilateral  TBIs appear  increased.    Summary:  Right: Resting right ankle-brachial index is within normal range. The  right toe-brachial index is normal.   Left: Resting left ankle-brachial index is within normal range. The left   toe-brachial index is normal.  __________  Aortoiliac ultrasound 10/23/2023: Summary:  Abdominal Aorta: No evidence of an abdominal aortic aneurysm was  visualized. The largest aortic measurement is 1.9 cm. Previous diameter  measurement was 1.2 cm obtained on 07/30/22. Not well visualized on  previous study.   Patent left commom iliac arery stent.   IVC/Iliac: There is no evidence of thrombus involving the IVC.  __________  Zio patch 08/2023:   The patient was monitored for 13 days, 23 hours.   The predominant rhythm was sinus with an average rate of 81 bpm (range 62-130 bpm in sinus).   There were rare PACs and PVCs.   Five supraventricular runs were observed, lasting up to 10 beats with a maximum rate of 162 bpm.   No sustained arrhythmia or prolonged pause was seen.   Patient triggered event corresponded to sinus tachycardia.   Predominantly sinus rhythm with rare PACs and PVCs as well as a few brief supraventricular runs. __________   2D echo 08/07/2023: 1. Left ventricular ejection fraction, by estimation, is 55 to 60%. Left  ventricular ejection fraction by PLAX is 58 %. The left ventricle has  normal function. The left ventricle demonstrates regional wall motion  abnormalities (basal to mid septal wall  hypokinesis). Left ventricular diastolic parameters are consistent with  Grade I diastolic dysfunction (impaired relaxation).   2. Right ventricular systolic function is normal. The right ventricular  size is normal. There is normal pulmonary artery systolic pressure. The  estimated right ventricular systolic pressure is 27.1 mmHg.   3. The mitral valve is normal in structure. Moderate mitral valve  regurgitation. No evidence of mitral stenosis.   4. The aortic valve is tricuspid. Aortic valve regurgitation is not  visualized. No aortic stenosis is present.   5. The inferior vena cava is normal in size with greater than 50%  respiratory variability, suggesting right  atrial pressure of 3 mmHg.  __________   2D echo 12/01/2021: 1. Left ventricular ejection fraction, by estimation, is 55 to 60%. The  left ventricle has normal function. Septal wall hypokinesis consistent  with bundle branch block. Left ventricular diastolic parameters were  normal.   2. Right ventricular systolic function is normal. The right ventricular  size is normal. There is mildly elevated pulmonary artery systolic  pressure. The estimated right ventricular systolic pressure is 36.4 mmHg.   3. The mitral valve is normal in structure. Mild to moderate mitral valve  regurgitation. No evidence of mitral stenosis.   4. The aortic valve is normal in structure. Aortic valve regurgitation is  not visualized. No aortic stenosis is present.   5. The inferior vena cava is normal in size with greater than 50%  respiratory variability, suggesting right atrial pressure of 3 mmHg.  __________   Zio patch 10/2021:   The patient was monitored for 13 days, 14 hours.   The predominant rhythm was sinus with an average rate of 85 bpm (range 66-132 bpm).   There were rare PACs and PVCs.  A single atrial run lasting 5 beats occurred, with a maximum rate of 111 bpm.   No sustained arrhythmia or prolonged pause was identified.   There were no patient triggered events.   Predominantly sinus rhythm with rare PACs  and PVCs as well as a single brief supraventricular run.  No significant arrhythmia observed. __________   Zio patch 05/2020: The patient was monitored for 13 days, 19 hours. The predominant rhythm was sinus with an average rate of 84 bpm (range 65-143 bpm in sinus). There were rare PAC's and PVC's. Five atrial runs lasting up to 18.8 seconds occurred with a maximum rate of 156 bpm. No sustained arrhythmia or prolonged pause was identified. There were no patient triggered events.   Predominantly sinus rhythm with a few episodes of PSVT as well as rare PAC's and PVC's. __________   2D echo  04/14/2019: 1. Left ventricular ejection fraction, by visual estimation, is 60 to  65%. The left ventricle has normal function. There is no left ventricular  hypertrophy.   2. Elevated left atrial pressure.   3. Left ventricular diastolic parameters are consistent with Grade I  diastolic dysfunction (impaired relaxation).   4. The left ventricle has no regional wall motion abnormalities.   5. Global right ventricle has normal systolic function.The right  ventricular size is normal. No increase in right ventricular wall  thickness.   6. Left atrial size was normal.   7. Right atrial size was normal.   8. The mitral valve is normal in structure. Trivial mitral valve  regurgitation. No evidence of mitral stenosis.   9. The tricuspid valve is not well visualized.  10. The aortic valve was not well visualized. Aortic valve regurgitation  is not visualized. No evidence of aortic valve sclerosis or stenosis.  11. The pulmonic valve was not well visualized. Pulmonic valve  regurgitation is not visualized.  12. TR signal is inadequate for assessing pulmonary artery systolic  pressure.  13. The inferior vena cava is normal in size with greater than 50%  respiratory variability, suggesting right atrial pressure of 3 mmHg.  14. The interatrial septum was not well visualized.   In comparison to the previous echocardiogram(s): Anterior/anterior septum  hypokinesis seen on previous test 11/06/2017. EF 50-55%. __________   Lexiscan  MPI 03/20/2019: Pharmacological myocardial perfusion imaging study with no significant  ischemia Normal wall motion, EF estimated at 85% No EKG changes concerning for ischemia at peak stress or in recovery. CT attenuation correction images with mild coronary calcification at bifurcation of LAD/left circumflex, moderate calcification of the descending aorta Resting EKG with sinus rhythm, left bundle branch block Low risk scan __________   [facility] 11/08/2017: Ost 1st Sept  lesion is 30% stenosed. 1st Mrg lesion is 30% stenosed. Prox RCA lesion is 30% stenosed. Mid RCA lesion is 30% stenosed. The left ventricular systolic function is normal. LV end diastolic pressure is mildly elevated.   1.  Mild nonobstructive coronary artery disease. 2.  Normal LV systolic function with an EF of 55% with borderline mid anterior wall hypokinesis likely due to IVCD. 3.  Right heart catheterization showed high normal filling pressures, normal pulmonary pressure and moderately reduced cardiac output.  Pulmonary capillary wedge pressure was 11 mmHg, PA pressure was 27 over 11 mmHg, cardiac output was 3.1 with a cardiac index of 1.8.   Recommendations: Continue medical therapy for diastolic heart failure.  Volume status has improved close to normal and thus I am going to switch furosemide  to oral. __________   2D echo 11/06/2017: - Left ventricle: The cavity size was normal. Wall thickness was    normal. Systolic function was normal. The estimated ejection    fraction was in the range of 50% to 55%. There is hypokinesis  of    the anteroseptal and anterior myocardium. Features are consistent    with a pseudonormal left ventricular filling pattern, with    concomitant abnormal relaxation and increased filling pressure    (grade 2 diastolic dysfunction). Doppler parameters are    consistent with high ventricular filling pressure.  - Mitral valve: There was mild regurgitation.  - Right ventricle: The cavity size was normal. Wall thickness was    normal. Systolic function was normal.  - Pulmonary arteries: Systolic pressure was mildly increased,    estimated to be 35 mm Hg.   Impressions:   - Compared with prior echo from 12/28/15, anterior/anteroseptal    hypokinesis is new. __________   [facility] 01/12/2016: Conclusions: Mild, non-obstructive coronary artery disease.  There is slightly sluggish flow throughout the coronary arteries, which may reflect microvascular  dysfunction. Upper normal to mildly elevated left ventricular filling pressure.   Recommendations Continue risk factor modification. Uptitrate metoprolol  and continue long-acting nitrate for possible component of microvascular dysfunction. Outpatient follow-up with myself. __________   2D echo 12/28/2015: - Procedure narrative: Transthoracic echocardiography. Image    quality was fair. The study was technically difficult.  - Left ventricle: The cavity size was normal. Wall thickness was    normal. Systolic function was normal. The estimated ejection    fraction was in the range of 55% to 60%. Wall motion was normal    grossly; there were no regional wall motion abnormalities. Left    ventricular diastolic function parameters were normal for the    patient&'s age. __________   Nuclear stress test 12/23/2015: There was no ST segment deviation noted during stress. Defect 1: There is a small defect of mild severity present in the apical anterior and apex location. Findings suggestive small area of ischemia. However, shifting breast atteunation artifact can not be excluded. This is a low risk study. The left ventricular ejection fraction is normal (55-65%). TID was present.   EKG:  EKG is ordered today.  The EKG ordered today demonstrates NSR, 78 bpm, LBBB  Recent Labs: 08/09/2023: ALT 32 12/16/2023: B Natriuretic Peptide 132.4; Hemoglobin 12.4; Platelets 123 12/24/2023: Magnesium  1.3 12/30/2023: BUN 10; Creatinine, Ser 1.07; Potassium 4.0; Sodium 138  Recent Lipid Panel    Component Value Date/Time   CHOL 126 02/25/2023 1016   TRIG 232 (H) 02/25/2023 1016   HDL 38 (L) 02/25/2023 1016   CHOLHDL 3.3 02/25/2023 1016   CHOLHDL 3.7 05/08/2022 0914   VLDL 39 05/08/2022 0914   LDLCALC 51 02/25/2023 1016   LDLDIRECT 57 02/25/2023 1016   LDLDIRECT 104 (H) 05/08/2022 0914    PHYSICAL EXAM:    VS:  BP (!) 100/52 (BP Location: Left Arm, Patient Position: Sitting, Cuff Size: Normal)    Pulse 78   Ht 5' 3 (1.6 m)   Wt 152 lb 2 oz (69 kg)   SpO2 97%   BMI 26.95 kg/m   BMI: Body mass index is 26.95 kg/m.  Physical Exam Vitals reviewed.  Constitutional:      Appearance: She is well-developed.  HENT:     Head: Normocephalic and atraumatic.  Eyes:     General:        Right eye: No discharge.        Left eye: No discharge.  Cardiovascular:     Rate and Rhythm: Normal rate and regular rhythm.     Pulses:          Posterior tibial pulses are 2+ on the right side and  2+ on the left side.     Heart sounds: S1 normal and S2 normal. Heart sounds not distant. No midsystolic click and no opening snap. Murmur heard.     Systolic murmur is present with a grade of 2/6 at the upper left sternal border.     No friction rub.  Pulmonary:     Effort: Pulmonary effort is normal. No respiratory distress.     Breath sounds: Normal breath sounds. No decreased breath sounds, wheezing, rhonchi or rales.  Musculoskeletal:     Cervical back: Normal range of motion.     Right lower leg: No edema.     Left lower leg: No edema.  Skin:    General: Skin is warm and dry.     Nails: There is no clubbing.  Neurological:     Mental Status: She is alert and oriented to person, place, and time.  Psychiatric:        Speech: Speech normal.        Behavior: Behavior normal.        Thought Content: Thought content normal.        Judgment: Judgment normal.     Wt Readings from Last 3 Encounters:  01/13/24 152 lb 2 oz (69 kg)  12/16/23 150 lb 12.7 oz (68.4 kg)  12/16/23 150 lb 12.8 oz (68.4 kg)     ASSESSMENT & PLAN:   Nonobstructive CAD: No symptoms of frank angina.  Continue aggressive risk factor modification including aspirin  81 mg, atorvastatin  80 mg, ezetimibe  10 mg, and Lopressor  12.5 mg twice daily.  Should symptoms of dyspnea return moving forward, consider coronary CTA.  HFpEF/pulmonary hypertension: Dyspnea much improved following diuresis, currently on furosemide  20 mg every  other day with KCl 40 mEq daily.  Suspect volume overload was in the setting of increased fluid intake which is now improved.  Recent echo showed preserved LV systolic function with normal RVSP.  Update BMP with recommendations on further guidance of furosemide  and potassium based on results.  Limit fluid intake to less than 2 L daily.  Mitral regurgitation: Moderate by echo in 08/2023.  Repeat echo in 1 to 2 years.  PAD: Status post left common iliac artery stenting with normal ABIs and lower extremity arterial ultrasound demonstrating patent stent in 10/2023.  Remains on aspirin  and statin as outlined below.  Continue with walking regimen.   HLD: LDL 51 in 02/2023.  She remains on atorvastatin  80 mg and ezetimibe  10 mg.  PSVT: Quiescent.  Remains on Lopressor  12.5 mg twice daily.  LBBB: Stable.  Recent echo without significant structural abnormality.  No symptoms of syncope.  COPD with prior tobacco use: Stable.  Likely contributing to dyspnea.  Quit tobacco in 11/2022.  Hypomagnesemia: Check magnesium  level.  Dysphagia: Has follow-up with GI.    Disposition: F/u with Dr. Mady or an APP in 2 months.   Medication Adjustments/Labs and Tests Ordered: Current medicines are reviewed at length with the patient today.  Concerns regarding medicines are outlined above. Medication changes, Labs and Tests ordered today are summarized above and listed in the Patient Instructions accessible in Encounters.   Signed, Bernardino Bring, PA-C 01/13/2024 10:10 AM     [facility] [place] [address] [place], [place] [address] [phone]

## 2024-01-14 ENCOUNTER — Ambulatory Visit: Payer: Self-pay | Admitting: Physician Assistant

## 2024-01-14 DIAGNOSIS — Z79899 Other long term (current) drug therapy: Secondary | ICD-10-CM

## 2024-01-14 LAB — BASIC METABOLIC PANEL WITH GFR
BUN/Creatinine Ratio: 11 — ABNORMAL LOW (ref 12–28)
BUN: 10 mg/dL (ref 8–27)
CO2: 20 mmol/L (ref 20–29)
Calcium: 9.2 mg/dL (ref 8.7–10.3)
Chloride: 102 mmol/L (ref 96–106)
Creatinine, Ser: 0.95 mg/dL (ref 0.57–1.00)
Glucose: 257 mg/dL — ABNORMAL HIGH (ref 70–99)
Potassium: 4.1 mmol/L (ref 3.5–5.2)
Sodium: 139 mmol/L (ref 134–144)
eGFR: 65 mL/min/1.73 (ref >59)

## 2024-01-14 LAB — MAGNESIUM: Magnesium: 1.6 mg/dL (ref 1.6–2.3)

## 2024-01-14 MED ORDER — POTASSIUM CHLORIDE ER 10 MEQ PO CPCR: 20.0000 meq | ORAL_CAPSULE | ORAL | 3 refills | Status: DC

## 2024-01-14 MED ORDER — MAGNESIUM OXIDE 400 MG PO TABS: 400.0000 mg | ORAL_TABLET | Freq: Every day | ORAL | 3 refills | Status: AC

## 2024-01-15 ENCOUNTER — Encounter: Payer: Self-pay | Admitting: Family Medicine

## 2024-01-16 ENCOUNTER — Ambulatory Visit: Admitting: Dietician

## 2024-01-22 ENCOUNTER — Ambulatory Visit: Payer: BC Managed Care – PPO | Admitting: Dermatology

## 2024-01-22 ENCOUNTER — Encounter: Payer: Self-pay | Admitting: Dermatology

## 2024-01-22 DIAGNOSIS — L738 Other specified follicular disorders: Secondary | ICD-10-CM

## 2024-01-22 DIAGNOSIS — L578 Other skin changes due to chronic exposure to nonionizing radiation: Secondary | ICD-10-CM | POA: Diagnosis not present

## 2024-01-22 DIAGNOSIS — W908XXA Exposure to other nonionizing radiation, initial encounter: Secondary | ICD-10-CM | POA: Diagnosis not present

## 2024-01-22 DIAGNOSIS — L821 Other seborrheic keratosis: Secondary | ICD-10-CM

## 2024-01-22 DIAGNOSIS — Z85828 Personal history of other malignant neoplasm of skin: Secondary | ICD-10-CM

## 2024-01-22 DIAGNOSIS — D229 Melanocytic nevi, unspecified: Secondary | ICD-10-CM

## 2024-01-22 DIAGNOSIS — D1801 Hemangioma of skin and subcutaneous tissue: Secondary | ICD-10-CM

## 2024-01-22 DIAGNOSIS — L814 Other melanin hyperpigmentation: Secondary | ICD-10-CM | POA: Diagnosis not present

## 2024-01-22 DIAGNOSIS — D692 Other nonthrombocytopenic purpura: Secondary | ICD-10-CM

## 2024-01-22 DIAGNOSIS — Z1283 Encounter for screening for malignant neoplasm of skin: Secondary | ICD-10-CM | POA: Diagnosis not present

## 2024-01-22 DIAGNOSIS — L820 Inflamed seborrheic keratosis: Secondary | ICD-10-CM | POA: Diagnosis not present

## 2024-01-22 DIAGNOSIS — Z7189 Other specified counseling: Secondary | ICD-10-CM

## 2024-01-22 NOTE — Progress Notes (Signed)
 Follow-Up Visit   Subjective  Katelyn Cooper is a 68 y.o. female who presents for the following: Skin Cancer Screening and Full Body Skin Exam  The patient presents for Total-Body Skin Exam (TBSE) for skin cancer screening and mole check. The patient has spots, moles and lesions to be evaluated, some may be new or changing and the patient may have concern these could be cancer.  The following portions of the chart were reviewed this encounter and updated as appropriate: medications, allergies, medical history  Review of Systems:  No other skin or systemic complaints except as noted in HPI or Assessment and Plan.  Objective  Well appearing patient in no apparent distress; mood and affect are within normal limits.  A full examination was performed including scalp, head, eyes, ears, nose, lips, neck, chest, axillae, abdomen, back, buttocks, bilateral upper extremities, bilateral lower extremities, hands, feet, fingers, toes, fingernails, and toenails. All findings within normal limits unless otherwise noted below.   Relevant physical exam findings are noted in the Assessment and Plan.  L sideburn x 1,R upper back parspinal x 1 (2) Stuck on waxy paps with erythema  Assessment & Plan   SKIN CANCER SCREENING PERFORMED TODAY.  ACTINIC DAMAGE - Chronic condition, secondary to cumulative UV/sun exposure - diffuse scaly erythematous macules with underlying dyspigmentation - Recommend daily broad spectrum sunscreen SPF 30+ to sun-exposed areas, reapply every 2 hours as needed.  - Staying in the shade or wearing long sleeves, sun glasses (UVA+UVB protection) and wide brim hats (4-inch brim around the entire circumference of the hat) are also recommended for sun protection.  - Call for new or changing lesions.  LENTIGINES, SEBORRHEIC KERATOSES, HEMANGIOMAS - Benign normal skin lesions - Benign-appearing - Call for any changes  MELANOCYTIC NEVI - Tan-brown and/or pink-flesh-colored  symmetric macules and papules - Benign appearing on exam today - Observation - Call clinic for new or changing moles - Recommend daily use of broad spectrum spf 30+ sunscreen to sun-exposed areas.   HISTORY OF BASAL CELL CARCINOMA OF THE SKIN - L nose above mid nasal alar groove, 08/08/2017, tx with Mohs - No evidence of recurrence today - Recommend regular full body skin exams - Recommend daily broad spectrum sunscreen SPF 30+ to sun-exposed areas, reapply every 2 hours as needed.  - Call if any new or changing lesions are noted between office visits  Purpura - Chronic; persistent and recurrent.  Treatable, but not curable. - Violaceous macules and patches - Benign - Related to trauma, age, sun damage and/or use of blood thinners, chronic use of topical and/or oral steroids - Observe - Can use OTC arnica containing moisturizer such as Dermend Bruise Formula if desired - Call for worsening or other concerns  INFLAMED SEBORRHEIC KERATOSIS (2) L sideburn x 1,R upper back parspinal x 1 (2) Symptomatic, irritating, patient would like treated. If R upper back paraspinal present in 2 mths RTC for additional tx. Destruction of lesion - L sideburn x 1,R upper back parspinal x 1 (2) Complexity: simple   Destruction method: cryotherapy   Informed consent: discussed and consent obtained   Timeout:  patient name, date of birth, surgical site, and procedure verified Lesion destroyed using liquid nitrogen: Yes   Region frozen until ice ball extended beyond lesion: Yes   Outcome: patient tolerated procedure well with no complications   Post-procedure details: wound care instructions given     Sebaceous Hyperplasia - Small yellow papules with a central dell - Benign-appearing - Observe.  Call for changes.  Return in about 1 year (around 01/21/2025) for TBSE - hx BCC.  LILLETTE Rosina Mayans, CMA, am acting as scribe for Alm Rhyme, MD .   Documentation: I have reviewed the above documentation  for accuracy and completeness, and I agree with the above.  Alm Rhyme, MD

## 2024-01-22 NOTE — Patient Instructions (Signed)

## 2024-01-26 ENCOUNTER — Other Ambulatory Visit: Payer: Self-pay | Admitting: Psychiatry

## 2024-02-04 ENCOUNTER — Other Ambulatory Visit: Payer: Self-pay | Admitting: Emergency Medicine

## 2024-02-04 ENCOUNTER — Ambulatory Visit: Admitting: Psychiatry

## 2024-02-04 DIAGNOSIS — Z8719 Personal history of other diseases of the digestive system: Secondary | ICD-10-CM | POA: Diagnosis not present

## 2024-02-04 DIAGNOSIS — K209 Esophagitis, unspecified without bleeding: Secondary | ICD-10-CM | POA: Diagnosis not present

## 2024-02-04 DIAGNOSIS — R1319 Other dysphagia: Secondary | ICD-10-CM | POA: Diagnosis not present

## 2024-02-04 DIAGNOSIS — Z79899 Other long term (current) drug therapy: Secondary | ICD-10-CM

## 2024-02-04 DIAGNOSIS — R1310 Dysphagia, unspecified: Secondary | ICD-10-CM | POA: Diagnosis not present

## 2024-02-05 ENCOUNTER — Ambulatory Visit: Payer: Self-pay | Admitting: Physician Assistant

## 2024-02-05 LAB — BASIC METABOLIC PANEL WITH GFR
BUN/Creatinine Ratio: 14 (ref 12–28)
BUN: 13 mg/dL (ref 8–27)
CO2: 21 mmol/L (ref 20–29)
Calcium: 9.8 mg/dL (ref 8.7–10.3)
Chloride: 98 mmol/L (ref 96–106)
Creatinine, Ser: 0.96 mg/dL (ref 0.57–1.00)
Glucose: 163 mg/dL — ABNORMAL HIGH (ref 70–99)
Potassium: 4.7 mmol/L (ref 3.5–5.2)
Sodium: 137 mmol/L (ref 134–144)
eGFR: 64 mL/min/1.73 (ref >59)

## 2024-02-13 DIAGNOSIS — M8580 Other specified disorders of bone density and structure, unspecified site: Secondary | ICD-10-CM | POA: Diagnosis not present

## 2024-02-13 DIAGNOSIS — Z01419 Encounter for gynecological examination (general) (routine) without abnormal findings: Secondary | ICD-10-CM | POA: Diagnosis not present

## 2024-02-13 DIAGNOSIS — Z124 Encounter for screening for malignant neoplasm of cervix: Secondary | ICD-10-CM | POA: Diagnosis not present

## 2024-02-13 DIAGNOSIS — Z1231 Encounter for screening mammogram for malignant neoplasm of breast: Secondary | ICD-10-CM | POA: Diagnosis not present
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
## 2024-02-14 ENCOUNTER — Ambulatory Visit

## 2024-02-14 ENCOUNTER — Other Ambulatory Visit: Payer: Self-pay

## 2024-02-14 ENCOUNTER — Encounter: Admission: RE | Disposition: A | Payer: Self-pay | Source: Home / Self Care | Attending: Gastroenterology

## 2024-02-14 ENCOUNTER — Encounter: Payer: Self-pay | Admitting: Gastroenterology

## 2024-02-14 ENCOUNTER — Ambulatory Visit
Admission: RE | Admit: 2024-02-14 | Discharge: 2024-02-14 | Disposition: A | Discharge status: Home / Self Care | Attending: Gastroenterology | Admitting: Gastroenterology

## 2024-02-14 DIAGNOSIS — Z87891 Personal history of nicotine dependence: Secondary | ICD-10-CM | POA: Diagnosis not present

## 2024-02-14 DIAGNOSIS — R1319 Other dysphagia: Secondary | ICD-10-CM | POA: Diagnosis not present

## 2024-02-14 DIAGNOSIS — Z8719 Personal history of other diseases of the digestive system: Secondary | ICD-10-CM | POA: Diagnosis not present

## 2024-02-14 DIAGNOSIS — R933 Abnormal findings on diagnostic imaging of other parts of digestive tract: Secondary | ICD-10-CM | POA: Diagnosis not present

## 2024-02-14 DIAGNOSIS — I110 Hypertensive heart disease with heart failure: Secondary | ICD-10-CM | POA: Diagnosis not present

## 2024-02-14 DIAGNOSIS — J449 Chronic obstructive pulmonary disease, unspecified: Secondary | ICD-10-CM | POA: Diagnosis not present

## 2024-02-14 DIAGNOSIS — I5032 Chronic diastolic (congestive) heart failure: Secondary | ICD-10-CM | POA: Diagnosis not present

## 2024-02-14 DIAGNOSIS — I2510 Atherosclerotic heart disease of native coronary artery without angina pectoris: Secondary | ICD-10-CM | POA: Diagnosis not present

## 2024-02-14 DIAGNOSIS — K209 Esophagitis, unspecified without bleeding: Secondary | ICD-10-CM | POA: Diagnosis not present

## 2024-02-14 DIAGNOSIS — E1151 Type 2 diabetes mellitus with diabetic peripheral angiopathy without gangrene: Secondary | ICD-10-CM | POA: Diagnosis not present

## 2024-02-14 DIAGNOSIS — R1314 Dysphagia, pharyngoesophageal phase: Secondary | ICD-10-CM | POA: Diagnosis not present

## 2024-02-14 DIAGNOSIS — Q394 Esophageal web: Secondary | ICD-10-CM | POA: Diagnosis not present

## 2024-02-14 DIAGNOSIS — K2100 Gastro-esophageal reflux disease with esophagitis, without bleeding: Secondary | ICD-10-CM | POA: Insufficient documentation

## 2024-02-14 HISTORY — PX: ESOPHAGEAL DILATION: SHX303

## 2024-02-14 HISTORY — PX: ESOPHAGOGASTRODUODENOSCOPY: SHX5428

## 2024-02-14 LAB — GLUCOSE, CAPILLARY: Glucose-Capillary: 139 mg/dL — ABNORMAL HIGH (ref 70–99)

## 2024-02-14 SURGERY — EGD (ESOPHAGOGASTRODUODENOSCOPY)
Anesthesia: General

## 2024-02-14 MED ORDER — PROPOFOL 500 MG/50ML IV EMUL
INTRAVENOUS | Status: DC | PRN
  Administered 2024-02-14: 100 mg via INTRAVENOUS
  Administered 2024-02-14: 150 ug/kg/min via INTRAVENOUS

## 2024-02-14 MED ORDER — LIDOCAINE HCL (PF) 2 % IJ SOLN
INTRAMUSCULAR | Status: DC | PRN
  Administered 2024-02-14: 100 mg via INTRADERMAL

## 2024-02-14 MED ORDER — SODIUM CHLORIDE 0.9 % IV SOLN: INTRAVENOUS | Status: DC

## 2024-02-14 NOTE — H&P (Signed)
 Corinn JONELLE Brooklyn, MD [facility], [facility] [address]  [place], [place] [address]  Main: [phone] Fax:  [phone] Pager: [phone]   Primary Care Physician:  [name], MD Primary Gastroenterologist:  Dr. Corinn JONELLE Brooklyn  Pre-Procedure History & Physical: HPI:  Katelyn Cooper is a 68 y.o. female is here for an endoscopy.   Past Medical History:  Diagnosis Date   (HFpEF) heart failure with preserved ejection fraction (HCC)    a. 12/2015 Echo: EF 55-60%, nl RV size/fxn, no significant valvular abnormalities; b. 10/2017 Echo: EF 50-55%, antsept, ant HK. Gr2 DD. Mild MR. Nl RV fxn. PASP .   Actinic keratosis    Anxiety    Basal cell carcinoma 08/08/2017   L nose above mid nasal alar groove - tx with Mohs   CHF (congestive heart failure) (HCC)    Depression    Diabetes type 2, controlled (HCC)    diet-controlled   Esophageal stricture    GERD (gastroesophageal reflux disease)    Hiatal hernia    History of chicken pox    Hyperlipidemia    Hypertension    Internal hemorrhoids    Non-obstructive CAD (coronary artery disease)    a. 12/2015 MV: apical ant and apical reversible defect. EF 55-65%; b. 01/2016 Cath: LM nl, LAD nl, SP1 40ost, LCX nl, OM1 30, RCA 30p/m, EDP 15-28mmHg; c. 10/2017 Cath: LM nl, LAD nl, SP1 30ost, LCX nl, OM1 30, RCA 30p/m. EF 55%.   Obstructive sleep apnea    PAD (peripheral artery disease)    a. 1990's s/p prior LCIA stenting; b. 12/2015 ABI: R 1.05, L 1.03.   Panic disorder     Past Surgical History:  Procedure Laterality Date   AORTA SURGERY  1999   stent placement; ? left iliac artery intervention   APPENDECTOMY  1998   BASAL CELL CARCINOMA EXCISION     C5 - fusion N/A 10/10/2021   CARDIAC CATHETERIZATION N/A 01/12/2016   Procedure: Left Heart Cath and Coronary Angiography;  Surgeon: Lonni Hanson, MD;  Location: [facility] INVASIVE CV LAB;  Service: Cardiovascular;  Laterality: N/A;   COLONOSCOPY   07/17/2019   FOOT SURGERY     Right   pap smear  03/2010   Done by Dr. Jon Rummer, normal   REFRACTIVE SURGERY     right   RIB RESECTION  8009,8006   RIGHT/LEFT HEART CATH AND CORONARY ANGIOGRAPHY N/A 11/08/2017   Procedure: RIGHT/LEFT HEART CATH AND CORONARY ANGIOGRAPHY;  Surgeon: Darron Deatrice LABOR, MD;  Location: [facility] INVASIVE CV LAB;  Service: Cardiovascular;  Laterality: N/A;   SHOULDER SURGERY  2005   left   UPPER GASTROINTESTINAL ENDOSCOPY  07/17/2019   UPPER GI ENDOSCOPY  09/19/2011   Stricture at GE junction, dilated. Mild gastritis and duodenitis. Small hiatal hernia    Prior to Admission medications   Medication Sig Start Date End Date Taking? Authorizing Provider  albuterol  (VENTOLIN  HFA) 108 (90 Base) MCG/ACT inhaler inhale 2 puffs by mouth every 6 hours if needed for wheezing or shortness of breath 09/26/22   Isaiah Scrivener, MD  aspirin  EC 81 MG tablet Take 1 tablet (81 mg total) by mouth daily. 12/12/15   End, Lonni, MD  atorvastatin  (LIPITOR) 80 MG tablet Take 1 tablet by mouth daily. 09/17/23   Dunn, Bernardino HERO, PA-C  clobetasol ointment (TEMOVATE) 0.05 % PLEASE SEE ATTACHED FOR DETAILED DIRECTIONS    [provider]  doxepin  (SINEQUAN ) 10 MG capsule Take 1 capsule (  10 mg total) by mouth at bedtime as needed (insomnia). 12/18/23 03/17/24  Vickey Mettle, MD  ezetimibe  (ZETIA ) 10 MG tablet Take 1 tablet by mouth daily. 06/05/23   Abigail Bernardino HERO, PA-C  furosemide  (LASIX ) 20 MG tablet Take 1 tablet by mouth every other day and may take 1 tablet by mouth  as needed for increased fluid or weight gain on days that you would normally not take a tablet. 04/18/23   Dunn, Bernardino HERO, PA-C  magnesium  oxide (MAG-OX) 400 MG tablet Take 1 tablet (400 mg total) by mouth daily. 01/14/24   Dunn, Bernardino HERO, PA-C  metFORMIN  (GLUCOPHAGE -XR) 500 MG 24 hr tablet Take 1 tablet by mouth twice daily. 09/15/23   [name], MD  metoprolol  tartrate (LOPRESSOR ) 25 MG tablet Take 0.5 tablets (12.5 mg  total) by mouth 2 (two) times daily. 08/13/23   Abigail Bernardino HERO, PA-C  pantoprazole  (PROTONIX ) 40 MG tablet Take 1 tablet (40 mg total) by mouth daily. Patient taking differently: Take 80 mg by mouth daily. 09/11/23   [name], MD  potassium chloride  (MICRO-K ) 10 MEQ CR capsule Take 2 capsules (20 mEq total) by mouth every other day. Take on the same day as Lasix . 01/14/24   Dunn, Bernardino HERO, PA-C  QUEtiapine  (SEROQUEL ) 100 MG tablet Take 1 tablet (100 mg total) by mouth at bedtime. 08/13/23 02/09/24  Vickey Mettle, MD  sucralfate  (CARAFATE ) 1 g tablet Take 1 tablet (1 g total) by mouth 4 (four) times daily -  with meals and at bedtime. 09/04/23   [name], MD  venlafaxine  XR (EFFEXOR -XR) 75 MG 24 hr capsule Take 1 capsule (75 mg total) by mouth daily with breakfast. 12/02/23 05/30/24  Vickey Mettle, MD    Allergies as of 02/05/2024 - Review Complete 01/22/2024  Allergen Reaction Noted   Mounjaro  [tirzepatide ]  11/08/2023   Lovastatin  12/23/2014   Paroxetine hcl  05/09/2011   Lamotrigine Rash 03/01/2015    Family History  Problem Relation Age of Onset   Lung cancer Mother        was a smoker   Emphysema Mother    Cancer Mother        Lung   Alcohol abuse Mother    Depression Mother    Heart disease Mother    Drug abuse Sister    Breast cancer Sister        x 2  (30&50)   Emphysema Sister    Bipolar disorder Sister    Alcohol abuse Other    Depression Other    Bipolar disorder Other    Heart disease Other    Vision loss Other    Alcohol abuse Father    Mood Disorder Father    Heart disease Maternal Grandmother    Stroke Maternal Grandmother    Esophageal cancer Maternal Aunt    Colon cancer Neg Hx    Colon polyps Neg Hx    Rectal cancer Neg Hx    Stomach cancer Neg Hx     Social History   Socioeconomic History   Marital status: Married    Spouse name: mark   Number of children: 0   Years of education: Not on file   Highest education level: 12th grade   Occupational History   Occupation: computer-retired    Employer: [employer]  Tobacco Use   Smoking status: Former    Current packs/day: 0.00    Types: Cigarettes    Quit date: 11/16/2022  Years since quitting: 1.2   Smokeless tobacco: Never  Vaping Use   Vaping status: Former  Substance and Sexual Activity   Alcohol use: No    Alcohol/week: 0.0 standard drinks of alcohol   Drug use: No   Sexual activity: Not Currently  Other Topics Concern   Not on file  Social History Narrative   Not on file   Social Drivers of Health   Financial Resource Strain: Low Risk  (02/04/2024)   Received from [facility]   Overall Financial Resource Strain (CARDIA)    Difficulty of Paying Living Expenses: Not hard at all  Food Insecurity: No Food Insecurity (02/04/2024)   Received from [facility]   Hunger Vital Sign    Within the past 12 months, you worried that your food would run out before you got the money to buy more.: Never true    Within the past 12 months, the food you bought just didn't last and you didn't have money to get more.: Never true  Transportation Needs: No Transportation Needs (02/04/2024)   Received from [facility] - Transportation    In the past 12 months, has lack of transportation kept you from medical appointments or from getting medications?: No    Lack of Transportation (Non-Medical): No  Physical Activity: Sufficiently Active (11/07/2023)   Exercise Vital Sign    Days of Exercise per Week: 3 days    Minutes of Exercise per Session: 130 min  Stress: No Stress Concern Present (11/07/2023)   [employer] - Occupational Stress Questionnaire    Feeling of Stress: Not at all  Social Connections: Moderately Isolated (11/07/2023)   Social Connection and Isolation Panel    Frequency of Communication with Friends and Family: Once a week    Frequency of Social Gatherings with Friends and  Family: Three times a week    Attends Religious Services: Never    Active Member of Clubs or Organizations: No    Attends Banker Meetings: Not on file    Marital Status: Married  Catering Manager Violence: Not At Risk (08/07/2023)   Humiliation, Afraid, Rape, and Kick questionnaire    Fear of Current or Ex-Partner: No    Emotionally Abused: No    Physically Abused: No    Sexually Abused: No    Review of Systems: See HPI, otherwise negative ROS  Physical Exam: BP (!) 130/58   Pulse 85   Temp (!) 97 F (36.1 C) (Temporal)   Resp 16   Ht 5' 3 (1.6 m)   Wt 68 kg   SpO2 96%   BMI 26.57 kg/m  General:   Alert,  pleasant and cooperative in NAD Head:  Normocephalic and atraumatic. Neck:  Supple; no masses or thyromegaly. Lungs:  Clear throughout to auscultation.    Heart:  Regular rate and rhythm. Abdomen:  Soft, nontender and nondistended. Normal bowel sounds, without guarding, and without rebound.   Neurologic:  Alert and  oriented x4;  grossly normal neurologically.  Impression/Plan: Katelyn Cooper is here for an endoscopy to be performed for esophageal dysphagia   Risks, benefits, limitations, and alternatives regarding  endoscopy have been reviewed with the patient.  Questions have been answered.  All parties agreeable.   Corinn Brooklyn, MD  02/14/2024, 9:43 AM

## 2024-02-14 NOTE — Op Note (Signed)
 [facility] Patient Name: Katelyn Cooper Procedure Date: 02/14/2024 9:42 AM MRN: [id number] Account #: [id number] Date of Birth: Mar 26, 1956 Admit Type: Outpatient Age: 68 Room: [facility] ENDO ROOM 4 Gender: Female Note Status: Finalized Instrument Name: Upper GI Scope [id number] Procedure:             Upper GI endoscopy Indications:           Esophageal dysphagia, , Abnormal CT of the GI tract,                         esophagus, h/o GERD, esophagitis Providers:             Corinn Jess Brooklyn MD, MD Referring MD:          Nancyann BRAVO. Gasper, MD (Referring MD) Medicines:             General Anesthesia Complications:         No immediate complications. Estimated blood loss: None. Procedure:             Pre-Anesthesia Assessment:                        - Prior to the procedure, a History and Physical was                         performed, and patient medications and allergies were                         reviewed. The patient is competent. The risks and                         benefits of the procedure and the sedation options and                         risks were discussed with the patient. All questions                         were answered and informed consent was obtained.                         Patient identification and proposed procedure were                         verified by the physician, the nurse, the                         anesthesiologist, the anesthetist and the technician                         in the pre-procedure area in the procedure room in the                         endoscopy suite. Mental Status Examination: alert and                         oriented. Airway Examination: normal oropharyngeal                         airway and neck mobility. Respiratory Examination:  clear to auscultation. CV Examination: normal.                         Prophylactic Antibiotics: The patient does not require                          prophylactic antibiotics. Prior Anticoagulants: The                         patient has taken no anticoagulant or antiplatelet                         agents. ASA Grade Assessment: III - A patient with                         severe systemic disease. After reviewing the risks and                         benefits, the patient was deemed in satisfactory                         condition to undergo the procedure. The anesthesia                         plan was to use general anesthesia. Immediately prior                         to administration of medications, the patient was                         re-assessed for adequacy to receive sedatives. The                         heart rate, respiratory rate, oxygen saturations,                         blood pressure, adequacy of pulmonary ventilation, and                         response to care were monitored throughout the                         procedure. The physical status of the patient was                         re-assessed after the procedure.                        After obtaining informed consent, the endoscope was                         passed under direct vision. Throughout the procedure,                         the patient's blood pressure, pulse, and oxygen                         saturations were monitored continuously. The Endoscope  was introduced through the mouth, and advanced to the                         second part of duodenum. The upper GI endoscopy was                         accomplished without difficulty. The patient tolerated                         the procedure well. 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 A TTS dilator was       passed through the scope. Dilation with an 11-08-08 mm x 5.5 cm CRE       balloon and a 01-11-11 mm x 5.5 cm CRE balloon dilator was performed to       11 mm. The dilation site was examined following endoscope reinsertion       and showed  mild mucosal disruption and moderate improvement in luminal       narrowing. Estimated blood loss: none.      Esophagogastric landmarks were identified: the gastroesophageal junction       was found at 38 cm from the incisors.      The middle third of the esophagus, lower third of the esophagus and       gastroesophageal junction were normal. Biopsies were taken with a cold       forceps for histology.      The entire examined stomach was normal.      The cardia and gastric fundus were normal on retroflexion.      The duodenal bulb and second portion of the duodenum were normal. Impression:            - Web in the upper third of the esophagus. Dilated.                        - Esophagogastric landmarks identified.                        - Normal middle third of esophagus, lower third of                         esophagus and gastroesophageal junction. Biopsied.                        - Normal stomach.                        - Normal duodenal bulb and second portion of the                         duodenum. Recommendation:        - Await pathology results.                        - Discharge patient to home (with escort).                        - Chopped diet and mechanical soft diet indefinitely.                        -  Continue present medications.                        - Follow an antireflux regimen indefinitely. Procedure Code(s):     --- Professional ---                        [phone], Esophagogastroduodenoscopy, flexible,                         transoral; with transendoscopic balloon dilation of                         esophagus (less than 30 mm diameter)                        43239, 59, Esophagogastroduodenoscopy, flexible,                         transoral; with biopsy, single or multiple Diagnosis Code(s):     --- Professional ---                        Q39.4, Esophageal web                        R13.14, Dysphagia, pharyngoesophageal phase                        R93.3, Abnormal  findings on diagnostic imaging of                         other parts of digestive tract CPT copyright 2022 American Medical Association. All rights reserved. The codes documented in this report are preliminary and upon coder review may  be revised to meet current compliance requirements. Dr. Corinn Brooklyn Corinn Jess Brooklyn MD, MD 02/14/2024 10:17:27 AM This report has been signed electronically. Number of Addenda: 0 Note Initiated On: 02/14/2024 9:42 AM Estimated Blood Loss:  Estimated blood loss: none.      [facility]

## 2024-02-14 NOTE — Transfer of Care (Signed)
 Immediate Anesthesia Transfer of Care Note  Patient: Katelyn Cooper [facility]  Procedure(s) Performed: EGD (ESOPHAGOGASTRODUODENOSCOPY) DILATION, ESOPHAGUS  Patient Location: Endoscopy Unit  Anesthesia Type:General  Level of Consciousness: awake  Airway & Oxygen Therapy: Patient Spontanous Breathing  Post-op Assessment: Report given to RN and Post -op Vital signs reviewed and stable  Post vital signs: Reviewed and stable  Last Vitals:  Vitals Value Taken Time  BP 132/63 02/14/24 10:17  Temp 35.6 C 02/14/24 10:17  Pulse 90 02/14/24 10:17  Resp 17 02/14/24 10:17  SpO2 100 % 02/14/24 10:17    Last Pain:  Vitals:   02/14/24 1017  TempSrc: Tympanic  PainSc: 0-No pain         Complications: There were no known notable events for this encounter.

## 2024-02-14 NOTE — Anesthesia Preprocedure Evaluation (Addendum)
 Anesthesia Evaluation  Patient identified by MRN, date of birth, ID band Patient awake    Reviewed: Allergy & Precautions, H&P , NPO status , Patient's Chart, lab work & pertinent test results  Airway Mallampati: II  TM Distance: >3 FB Neck ROM: full    Dental  (+) Edentulous Lower, Edentulous Upper   Pulmonary COPD, former smoker   Pulmonary exam normal        Cardiovascular Exercise Tolerance: Good hypertension, + CAD, + Peripheral Vascular Disease and +CHF  Normal cardiovascular exam     Neuro/Psych  PSYCHIATRIC DISORDERS  Depression    negative neurological ROS     GI/Hepatic negative GI ROS, Neg liver ROS,,,  Endo/Other  diabetes, Type 2    Renal/GU negative Renal ROS  negative genitourinary   Musculoskeletal   Abdominal Normal abdominal exam  (+)   Peds  Hematology negative hematology ROS (+)   Anesthesia Other Findings Past Medical History: No date: (HFpEF) heart failure with preserved ejection fraction (HCC)     Comment:  a. 12/2015 Echo: EF 55-60%, nl RV size/fxn, no               significant valvular abnormalities; b. 10/2017 Echo: EF               50-55%, antsept, ant HK. Gr2 DD. Mild MR. Nl RV fxn. PASP              . No date: Actinic keratosis No date: Anxiety 08/08/2017: Basal cell carcinoma     Comment:  L nose above mid nasal alar groove - tx with Mohs No date: CHF (congestive heart failure) (HCC) No date: Depression No date: Diabetes type 2, controlled (HCC)     Comment:  diet-controlled No date: Esophageal stricture No date: GERD (gastroesophageal reflux disease) No date: Hiatal hernia No date: History of chicken pox No date: Hyperlipidemia No date: Hypertension No date: Internal hemorrhoids No date: Non-obstructive CAD (coronary artery disease)     Comment:  a. 12/2015 MV: apical ant and apical reversible defect.               EF 55-65%; b. 01/2016 Cath: LM nl, LAD nl, SP1 40ost,  LCX              nl, OM1 30, RCA 30p/m, EDP 15-32mmHg; c. 10/2017 Cath: LM               nl, LAD nl, SP1 30ost, LCX nl, OM1 30, RCA 30p/m. EF 55%. No date: Obstructive sleep apnea No date: PAD (peripheral artery disease)     Comment:  a. 1990's s/p prior LCIA stenting; b. 12/2015 ABI: R               1.05, L 1.03. No date: Panic disorder  Past Surgical History: 1999: AORTA SURGERY     Comment:  stent placement; ? left iliac artery intervention 1998: APPENDECTOMY No date: BASAL CELL CARCINOMA EXCISION 10/10/2021: C5 - fusion; N/A 01/12/2016: CARDIAC CATHETERIZATION; N/A     Comment:  Procedure: Left Heart Cath and Coronary Angiography;                Surgeon: Lonni Hanson, MD;  Location: [facility] INVASIVE CV               LAB;  Service: Cardiovascular;  Laterality: N/A; 07/17/2019: COLONOSCOPY No date: FOOT SURGERY     Comment:  Right 03/2010: pap smear     Comment:  Done by Dr. Jon Rummer,  normal No date: REFRACTIVE SURGERY     Comment:  right 8009,8006: RIB RESECTION 11/08/2017: RIGHT/LEFT HEART CATH AND CORONARY ANGIOGRAPHY; N/A     Comment:  Procedure: RIGHT/LEFT HEART CATH AND CORONARY               ANGIOGRAPHY;  Surgeon: Darron Deatrice LABOR, MD;  Location:               [facility] INVASIVE CV LAB;  Service: Cardiovascular;                Laterality: N/A; 2005: SHOULDER SURGERY     Comment:  left 07/17/2019: UPPER GASTROINTESTINAL ENDOSCOPY 09/19/2011: UPPER GI ENDOSCOPY     Comment:  Stricture at GE junction, dilated. Mild gastritis and               duodenitis. Small hiatal hernia  BMI    Body Mass Index: 26.57 kg/m      Reproductive/Obstetrics negative OB ROS                              Anesthesia Physical Anesthesia Plan  ASA: 3  Anesthesia Plan: General   Post-op Pain Management: Minimal or no pain anticipated   Induction: Intravenous  PONV Risk Score and Plan: Propofol infusion and TIVA  Airway Management Planned: Natural  Airway  Additional Equipment:   Intra-op Plan:   Post-operative Plan:   Informed Consent: I have reviewed the patients History and Physical, chart, labs and discussed the procedure including the risks, benefits and alternatives for the proposed anesthesia with the patient or authorized representative who has indicated his/her understanding and acceptance.     Dental Advisory Given  Plan Discussed with: CRNA and Surgeon  Anesthesia Plan Comments:          Anesthesia Quick Evaluation

## 2024-02-14 NOTE — Anesthesia Postprocedure Evaluation (Signed)
 Anesthesia Post Note  Patient: Katelyn Cooper [facility]  Procedure(s) Performed: EGD (ESOPHAGOGASTRODUODENOSCOPY) DILATION, ESOPHAGUS  Patient location during evaluation: Endoscopy Anesthesia Type: General Level of consciousness: awake and alert Pain management: pain level controlled Vital Signs Assessment: post-procedure vital signs reviewed and stable Respiratory status: spontaneous breathing, nonlabored ventilation and respiratory function stable Cardiovascular status: blood pressure returned to baseline and stable Postop Assessment: no apparent nausea or vomiting Anesthetic complications: no   There were no known notable events for this encounter.   Last Vitals:  Vitals:   02/14/24 1027 02/14/24 1038  BP: (!) 118/57 (!) 115/45  Pulse: 81 78  Resp: 16 19  Temp:    SpO2: 100% 100%    Last Pain:  Vitals:   02/14/24 1038  TempSrc:   PainSc: 0-No pain                 Camellia Merilee Louder

## 2024-02-17 ENCOUNTER — Other Ambulatory Visit: Payer: Self-pay | Admitting: Obstetrics and Gynecology

## 2024-02-17 DIAGNOSIS — M8580 Other specified disorders of bone density and structure, unspecified site: Secondary | ICD-10-CM

## 2024-02-17 LAB — SURGICAL PATHOLOGY

## 2024-02-18 ENCOUNTER — Ambulatory Visit (INDEPENDENT_AMBULATORY_CARE_PROVIDER_SITE_OTHER): Admitting: Psychiatry

## 2024-02-18 ENCOUNTER — Other Ambulatory Visit: Payer: Self-pay

## 2024-02-18 ENCOUNTER — Encounter: Payer: Self-pay | Admitting: Psychiatry

## 2024-02-18 VITALS — BP 107/62 | HR 91 | Temp 97.3°F | Ht 63.0 in | Wt 152.6 lb

## 2024-02-18 DIAGNOSIS — F411 Generalized anxiety disorder: Secondary | ICD-10-CM

## 2024-02-18 DIAGNOSIS — G4700 Insomnia, unspecified: Secondary | ICD-10-CM | POA: Diagnosis not present

## 2024-02-18 DIAGNOSIS — F3341 Major depressive disorder, recurrent, in partial remission: Secondary | ICD-10-CM | POA: Diagnosis not present

## 2024-02-18 MED ORDER — QUETIAPINE FUMARATE 100 MG PO TABS: 100.0000 mg | ORAL_TABLET | Freq: Every day | ORAL | 1 refills | Status: AC

## 2024-02-18 MED ORDER — HYDROXYZINE HCL 25 MG PO TABS: 25.0000 mg | ORAL_TABLET | Freq: Every day | ORAL | 1 refills | Status: DC | PRN

## 2024-02-18 MED ORDER — VENLAFAXINE HCL ER 150 MG PO CP24: 150.0000 mg | ORAL_CAPSULE | Freq: Every day | ORAL | 1 refills | Status: DC

## 2024-02-18 NOTE — Patient Instructions (Signed)
 Increase venlafaxine  150 mg daily Continue quetiapine  100 mg at night  Start hydroxyzine 25 mg daily as needed for anxiety Continue doxepin  10 mg at night as needed for insomnia Next appointment- 1/13 at 2:30

## 2024-02-19 ENCOUNTER — Ambulatory Visit: Payer: Self-pay | Admitting: Gastroenterology

## 2024-02-19 ENCOUNTER — Encounter: Payer: Self-pay | Admitting: Dietician

## 2024-02-19 NOTE — Progress Notes (Signed)
 Pathology results from upper endoscopy came back normal She had esophageal web in upper esophagus which was dilated to 11 mm.  Therefore, recommend repeat EGD with dilation in 4 weeks Continue Protonix  40 mg 1-2 times daily indefinitely  RV

## 2024-02-25 ENCOUNTER — Telehealth: Payer: Self-pay

## 2024-02-25 ENCOUNTER — Other Ambulatory Visit: Payer: Self-pay | Admitting: Psychiatry

## 2024-02-25 MED ORDER — DOXEPIN HCL 10 MG PO CAPS: 10.0000 mg | ORAL_CAPSULE | Freq: Every evening | ORAL | 0 refills | Status: AC | PRN

## 2024-02-25 NOTE — Telephone Encounter (Signed)
 Patient called to report that the  hydrOXYzine  (ATARAX ) 25 MG tablet  is not working and she would like to go back to the doxepin  (SINEQUAN ) 10 MG capsule  she is requesting to do the 20 mg she does need a refill and would like for it to be sent to the   [id number] [place], [place] - [address] Phone: [phone]  Fax: [phone]

## 2024-02-25 NOTE — Telephone Encounter (Signed)
 I do not recommend increasing the dose of doxepin  at this time since we have also increased the venlafaxine  dose. Please advise her to resume doxepin  at 10 mg. I believe she has medication to last till next month. I have sent a refill to the pharmacy.

## 2024-02-29 ENCOUNTER — Other Ambulatory Visit: Payer: Self-pay | Admitting: Psychiatry

## 2024-02-29 ENCOUNTER — Other Ambulatory Visit: Payer: Self-pay | Admitting: Family Medicine

## 2024-02-29 DIAGNOSIS — K219 Gastro-esophageal reflux disease without esophagitis: Secondary | ICD-10-CM

## 2024-03-08 DIAGNOSIS — D696 Thrombocytopenia, unspecified: Secondary | ICD-10-CM | POA: Insufficient documentation

## 2024-03-09 ENCOUNTER — Encounter: Payer: Self-pay | Admitting: Family Medicine

## 2024-03-09 ENCOUNTER — Other Ambulatory Visit: Payer: Self-pay | Admitting: Medical Genetics

## 2024-03-09 ENCOUNTER — Ambulatory Visit: Admitting: Family Medicine

## 2024-03-09 VITALS — BP 116/57 | HR 90 | Ht 63.0 in | Wt 148.8 lb

## 2024-03-09 DIAGNOSIS — N9089 Other specified noninflammatory disorders of vulva and perineum: Secondary | ICD-10-CM | POA: Diagnosis not present

## 2024-03-09 DIAGNOSIS — I2510 Atherosclerotic heart disease of native coronary artery without angina pectoris: Secondary | ICD-10-CM

## 2024-03-09 DIAGNOSIS — Z7984 Long term (current) use of oral hypoglycemic drugs: Secondary | ICD-10-CM

## 2024-03-09 DIAGNOSIS — E1151 Type 2 diabetes mellitus with diabetic peripheral angiopathy without gangrene: Secondary | ICD-10-CM

## 2024-03-09 DIAGNOSIS — R413 Other amnesia: Secondary | ICD-10-CM | POA: Diagnosis not present

## 2024-03-09 DIAGNOSIS — A630 Anogenital (venereal) warts: Secondary | ICD-10-CM | POA: Diagnosis not present

## 2024-03-09 DIAGNOSIS — I10 Essential (primary) hypertension: Secondary | ICD-10-CM | POA: Diagnosis not present

## 2024-03-09 DIAGNOSIS — E1169 Type 2 diabetes mellitus with other specified complication: Secondary | ICD-10-CM

## 2024-03-09 DIAGNOSIS — E785 Hyperlipidemia, unspecified: Secondary | ICD-10-CM

## 2024-03-09 DIAGNOSIS — R9230 Dense breasts, unspecified: Secondary | ICD-10-CM | POA: Diagnosis not present

## 2024-03-09 DIAGNOSIS — D696 Thrombocytopenia, unspecified: Secondary | ICD-10-CM

## 2024-03-09 NOTE — Progress Notes (Signed)
 Established patient visit   Patient: Katelyn Cooper   DOB: [birth year]   68 y.o. Female  MRN: [id number] Visit Date: 03/09/2024  Today's healthcare provider: Nancyann Perry, MD   Chief Complaint  Patient presents with   Follow-up    Follow up for diabetes.   Subjective    Discussed the use of AI scribe software for clinical note transcription with the patient, who gave verbal consent to proceed.  History of Present Illness   Katelyn Cooper is a 68 year old female with type 2 diabetes, hypertension, and hyperlipidemia who presents for a follow-up visit.  She does not regularly monitor her blood glucose levels at home. Her metformin  dosage was increased to 3 a day in October due to elevated blood sugar levels a labs done by her cardiologist, and she is currently taking an additional dose without any side effects.  For hypertension, she does not monitor her blood pressure at home. Her metoprolol  dosage was adjusted to half a tablet in the morning and half in the evening. No chest pain, heart flutters, or shortness of breath.  Her venlafaxine  dosage was increased from 75 mg to 150 mg by her psychiatrist about a month ago, significantly improving her mood. She had been feeling 'bummed and down' prior to the adjustment.  She is concerned about her memory, noting frequent forgetfulness and repeating questions to her husband, which can be frustrating for him. She wonders if this is related to aging or her medications.  No swelling in her hands or feet, but when swelling occurs, it affects her lungs.     Lab Results  Component Value Date   HGBA1C 7.9 (A) 11/08/2023   HGBA1C 6.6 (H) 08/06/2023   HGBA1C 8.0 (H) 02/25/2023   Lab Results  Component Value Date   NA 137 02/04/2024   K 4.7 02/04/2024   CREATININE 0.96 02/04/2024   EGFR 64 02/04/2024   GLUCOSE 163 (H) 02/04/2024   Lab Results  Component Value Date   CHOL 126 02/25/2023   HDL 38 (L) 02/25/2023   LDLCALC  51 02/25/2023   LDLDIRECT 57 02/25/2023   TRIG 232 (H) 02/25/2023   CHOLHDL 3.3 02/25/2023   Lab Results  Component Value Date   VD25OH 64.59 08/08/2023   Lab Results  Component Value Date   WBC 9.0 12/16/2023   HGB 12.4 12/16/2023   HCT 38.2 12/16/2023   MCV 89.5 12/16/2023   PLT 123 (L) 12/16/2023    No results found for: VITAMINB12   Medications: Outpatient Medications Prior to Visit  Medication Sig   albuterol  (VENTOLIN  HFA) 108 (90 Base) MCG/ACT inhaler inhale 2 puffs by mouth every 6 hours if needed for wheezing or shortness of breath   aspirin  EC 81 MG tablet Take 1 tablet (81 mg total) by mouth daily.   atorvastatin  (LIPITOR) 80 MG tablet Take 1 tablet by mouth daily.   [START ON 03/17/2024] doxepin  (SINEQUAN ) 10 MG capsule Take 1 capsule (10 mg total) by mouth at bedtime as needed (insomnia).   ezetimibe  (ZETIA ) 10 MG tablet Take 1 tablet by mouth daily.   furosemide  (LASIX ) 20 MG tablet Take 1 tablet by mouth every other day and may take 1 tablet by mouth  as needed for increased fluid or weight gain on days that you would normally not take a tablet.   magnesium  oxide (MAG-OX) 400 MG tablet Take 1 tablet (400 mg total) by mouth daily.   metFORMIN  (GLUCOPHAGE -XR) 500  MG 24 hr tablet Take 1 tablet by mouth twice daily. (Patient taking differently: Take by mouth. Take one every morning and 2 in the evening with food)   metoprolol  tartrate (LOPRESSOR ) 25 MG tablet Take 0.5 tablets (12.5 mg total) by mouth 2 (two) times daily.   pantoprazole  (PROTONIX ) 40 MG tablet TAKE 1 TABLET BY MOUTH TWICE A DAY   potassium chloride  (MICRO-K ) 10 MEQ CR capsule Take 2 capsules (20 mEq total) by mouth every other day. Take on the same day as Lasix .   QUEtiapine  (SEROQUEL ) 100 MG tablet Take 1 tablet (100 mg total) by mouth at bedtime.   venlafaxine  XR (EFFEXOR -XR) 150 MG 24 hr capsule Take 1 capsule (150 mg total) by mouth daily with breakfast.   hydrOXYzine  (ATARAX ) 25 MG tablet Take 1  tablet (25 mg total) by mouth daily as needed for anxiety. (Patient not taking: Reported on 03/09/2024)   sucralfate  (CARAFATE ) 1 g tablet Take 1 tablet (1 g total) by mouth 4 (four) times daily -  with meals and at bedtime. (Patient not taking: Reported on 03/09/2024)   No facility-administered medications prior to visit.        Objective    BP (!) 116/57 (BP Location: Right Arm, Patient Position: Sitting, Cuff Size: Normal)   Pulse 90   Ht 5' 3 (1.6 m)   Wt 148 lb 12.8 oz (67.5 kg)   SpO2 90%   BMI 26.36 kg/m   Physical Exam   General: Appearance:    Well developed, well nourished female in no acute distress  Eyes:    PERRL, conjunctiva/corneas clear, EOM's intact       Lungs:     Clear to auscultation bilaterally, respirations unlabored  Heart:    Normal heart rate. Normal rhythm. No murmurs, rubs, or gallops.    MS:   All extremities are intact.    Neurologic:   Awake, alert, oriented x 3. No apparent focal neurological defect.         Assessment & Plan    1. Type 2 diabetes mellitus with diabetic peripheral angiopathy without gangrene, without long-term current use of insulin  (HCC) (Primary) Doing well since adding 3rd daily metformin , but not checking home blood sugars.  - Urine Albumin/Creatinine with ratio (send out) [LAB689] - Hemoglobin A1c  2. Hyperlipidemia associated with type 2 diabetes mellitus (HCC) She is tolerating atorvastatin  ezetimibe  well with no adverse effects.   - CBC - Comprehensive metabolic panel with GFR - Lipid panel  3. Coronary artery disease involving native coronary artery of native heart without angina pectoris Asymptomatic. Compliant with medication.  Continue aggressive risk factor modification. Continue routine follow up with cardiology.   4. Essential hypertension Well controlled.  Continue current medications.    5. Thrombocytopenia Check CBC today.   6. Memory change A*Ox3 today, but told by her husband she frequently repeats  questions that were recently answered.   - Vitamin B12 - TSH  Return in about 4 months (around 07/08/2024).     Nancyann Perry, MD  [facility] [phone] (phone) [phone] (fax)  [facility]

## 2024-03-09 NOTE — Patient Instructions (Signed)
 Katelyn Cooper  Please review the attached list of medications and notify my office if there are any errors.   . Please bring all of your medications to every appointment so we can make sure that our medication list is the same as yours.

## 2024-03-10 LAB — MICROALBUMIN / CREATININE URINE RATIO

## 2024-03-10 LAB — LIPID PANEL

## 2024-03-11 ENCOUNTER — Other Ambulatory Visit: Payer: Self-pay | Admitting: Psychiatry

## 2024-03-12 ENCOUNTER — Ambulatory Visit: Payer: Self-pay | Admitting: Family Medicine

## 2024-03-12 DIAGNOSIS — E119 Type 2 diabetes mellitus without complications: Secondary | ICD-10-CM

## 2024-03-12 LAB — COMPREHENSIVE METABOLIC PANEL WITH GFR
ALT: 29 IU/L (ref 0–32)
AST: 40 IU/L (ref 0–40)
Albumin: 4.2 g/dL (ref 3.9–4.9)
Alkaline Phosphatase: 117 IU/L (ref 49–135)
BUN/Creatinine Ratio: 11 — AB (ref 12–28)
BUN: 13 mg/dL (ref 8–27)
Bilirubin Total: 0.4 mg/dL (ref 0.0–1.2)
CO2: 23 mmol/L (ref 20–29)
Calcium: 9.1 mg/dL (ref 8.7–10.3)
Chloride: 95 mmol/L — AB (ref 96–106)
Creatinine, Ser: 1.15 mg/dL — AB (ref 0.57–1.00)
Globulin, Total: 3.3 g/dL (ref 1.5–4.5)
Glucose: 234 mg/dL — AB (ref 70–99)
Potassium: 3.5 mmol/L (ref 3.5–5.2)
Sodium: 138 mmol/L (ref 134–144)
Total Protein: 7.5 g/dL (ref 6.0–8.5)
eGFR: 52 mL/min/1.73 — AB (ref >59)

## 2024-03-12 LAB — VITAMIN B12: Vitamin B-12: 378 pg/mL (ref 232–1245)

## 2024-03-12 LAB — MICROALBUMIN / CREATININE URINE RATIO

## 2024-03-12 LAB — CBC
Hematocrit: 37.9 % (ref 34.0–46.6)
Hemoglobin: 11.9 g/dL (ref 11.1–15.9)
MCH: 28.2 pg (ref 26.6–33.0)
MCHC: 31.4 g/dL — ABNORMAL LOW (ref 31.5–35.7)
MCV: 90 fL (ref 79–97)
Platelets: 178 x10E3/uL (ref 150–450)
RBC: 4.22 x10E6/uL (ref 3.77–5.28)
RDW: 14.5 % (ref 11.7–15.4)
WBC: 8.1 x10E3/uL (ref 3.4–10.8)

## 2024-03-12 LAB — HEMOGLOBIN A1C
Est. average glucose Bld gHb Est-mCnc: 186 mg/dL
Hgb A1c MFr Bld: 8.1 % — ABNORMAL HIGH (ref 4.8–5.6)

## 2024-03-12 LAB — LIPID PANEL
Cholesterol, Total: 118 mg/dL (ref 100–199)
HDL: 50 mg/dL (ref >39)
LDL CALC COMMENT:: 2.4 ratio (ref 0.0–4.4)
LDL Chol Calc (NIH): 38 mg/dL (ref 0–99)
Triglycerides: 184 mg/dL — AB (ref 0–149)
VLDL Cholesterol Cal: 30 mg/dL (ref 5–40)

## 2024-03-12 LAB — TSH: TSH: 3.23 u[IU]/mL (ref 0.450–4.500)

## 2024-03-12 MED ORDER — METFORMIN HCL ER 500 MG PO TB24: 1000.0000 mg | ORAL_TABLET | Freq: Two times a day (BID) | ORAL | Status: DC

## 2024-03-13 ENCOUNTER — Encounter: Payer: Self-pay | Admitting: Gastroenterology

## 2024-03-13 ENCOUNTER — Ambulatory Visit

## 2024-03-13 ENCOUNTER — Encounter: Admission: RE | Disposition: A | Payer: Self-pay | Attending: Gastroenterology

## 2024-03-13 ENCOUNTER — Ambulatory Visit
Admission: RE | Admit: 2024-03-13 | Discharge: 2024-03-13 | Disposition: A | Attending: Gastroenterology | Admitting: Gastroenterology

## 2024-03-13 DIAGNOSIS — I5032 Chronic diastolic (congestive) heart failure: Secondary | ICD-10-CM | POA: Diagnosis not present

## 2024-03-13 DIAGNOSIS — Z79899 Other long term (current) drug therapy: Secondary | ICD-10-CM | POA: Diagnosis not present

## 2024-03-13 DIAGNOSIS — R1319 Other dysphagia: Secondary | ICD-10-CM | POA: Diagnosis not present

## 2024-03-13 DIAGNOSIS — F32A Depression, unspecified: Secondary | ICD-10-CM | POA: Diagnosis not present

## 2024-03-13 DIAGNOSIS — I110 Hypertensive heart disease with heart failure: Secondary | ICD-10-CM | POA: Diagnosis not present

## 2024-03-13 DIAGNOSIS — Z7982 Long term (current) use of aspirin: Secondary | ICD-10-CM | POA: Diagnosis not present

## 2024-03-13 DIAGNOSIS — Z7984 Long term (current) use of oral hypoglycemic drugs: Secondary | ICD-10-CM | POA: Diagnosis not present

## 2024-03-13 DIAGNOSIS — Q394 Esophageal web: Secondary | ICD-10-CM | POA: Diagnosis not present

## 2024-03-13 DIAGNOSIS — E1151 Type 2 diabetes mellitus with diabetic peripheral angiopathy without gangrene: Secondary | ICD-10-CM | POA: Diagnosis not present

## 2024-03-13 DIAGNOSIS — I2510 Atherosclerotic heart disease of native coronary artery without angina pectoris: Secondary | ICD-10-CM | POA: Diagnosis not present

## 2024-03-13 DIAGNOSIS — E785 Hyperlipidemia, unspecified: Secondary | ICD-10-CM | POA: Diagnosis not present

## 2024-03-13 DIAGNOSIS — J449 Chronic obstructive pulmonary disease, unspecified: Secondary | ICD-10-CM | POA: Diagnosis not present

## 2024-03-13 DIAGNOSIS — Z7951 Long term (current) use of inhaled steroids: Secondary | ICD-10-CM | POA: Diagnosis not present

## 2024-03-13 DIAGNOSIS — Z87891 Personal history of nicotine dependence: Secondary | ICD-10-CM | POA: Diagnosis not present

## 2024-03-13 DIAGNOSIS — I509 Heart failure, unspecified: Secondary | ICD-10-CM | POA: Diagnosis not present

## 2024-03-13 DIAGNOSIS — R1314 Dysphagia, pharyngoesophageal phase: Secondary | ICD-10-CM | POA: Diagnosis not present

## 2024-03-13 DIAGNOSIS — R1310 Dysphagia, unspecified: Secondary | ICD-10-CM | POA: Diagnosis not present

## 2024-03-13 HISTORY — PX: ESOPHAGOGASTRODUODENOSCOPY: SHX5428

## 2024-03-13 LAB — GLUCOSE, CAPILLARY: Glucose-Capillary: 149 mg/dL — ABNORMAL HIGH (ref 70–99)

## 2024-03-13 SURGERY — EGD (ESOPHAGOGASTRODUODENOSCOPY)
Anesthesia: General

## 2024-03-13 MED ORDER — NYSTATIN 100000 UNIT/ML MT SUSP: 5.0000 mL | Freq: Four times a day (QID) | OROMUCOSAL | 0 refills | Status: AC

## 2024-03-13 MED ORDER — SODIUM CHLORIDE 0.9 % IV SOLN
INTRAVENOUS | Status: DC
  Administered 2024-03-13: 20 mL/h via INTRAVENOUS

## 2024-03-13 MED ORDER — PROPOFOL 10 MG/ML IV BOLUS
INTRAVENOUS | Status: DC | PRN
  Administered 2024-03-13: 40 mg via INTRAVENOUS

## 2024-03-13 MED ORDER — PROPOFOL 500 MG/50ML IV EMUL
INTRAVENOUS | Status: DC | PRN
  Administered 2024-03-13: 125 ug/kg/min via INTRAVENOUS

## 2024-03-13 MED ORDER — LIDOCAINE HCL (CARDIAC) PF 100 MG/5ML IV SOSY
PREFILLED_SYRINGE | INTRAVENOUS | Status: DC | PRN
  Administered 2024-03-13: 100 mg via INTRAVENOUS

## 2024-03-13 MED ORDER — FENTANYL CITRATE (PF) 100 MCG/2ML IJ SOLN
INTRAMUSCULAR | Status: AC
  Filled 2024-03-13: qty 2

## 2024-03-13 MED ORDER — FENTANYL CITRATE (PF) 100 MCG/2ML IJ SOLN
INTRAMUSCULAR | Status: DC | PRN
  Administered 2024-03-13: 50 ug via INTRAVENOUS

## 2024-03-13 NOTE — Transfer of Care (Signed)
 Immediate Anesthesia Transfer of Care Note  Patient: Xia Stohr [facility]  Procedure(s) Performed: EGD (ESOPHAGOGASTRODUODENOSCOPY)  Patient Location: PACU  Anesthesia Type:General  Level of Consciousness: awake and alert   Airway & Oxygen Therapy: Patient Spontanous Breathing  Post-op Assessment: Report given to RN and Post -op Vital signs reviewed and stable  Post vital signs: Reviewed and stable  Last Vitals:  Vitals Value Taken Time  BP 126/75 03/13/24 12:13  Temp 35.9 C 03/13/24 12:13  Pulse 88 03/13/24 12:13  Resp 18 03/13/24 12:13  SpO2 95 % 03/13/24 12:13    Last Pain:  Vitals:   03/13/24 1213  TempSrc: Temporal  PainSc: Asleep         Complications: No notable events documented.

## 2024-03-13 NOTE — Op Note (Signed)
 [facility] Patient Name: Katelyn Cooper Procedure Date: 03/13/2024 11:58 AM MRN: [id number] Account #: [id number] Date of Birth: 1955-05-16 Admit Type: Outpatient Age: 68 Room: [facility] ENDO ROOM 4 Gender: Female Note Status: Finalized Instrument Name: Upper GI Scope [id number] Procedure:             Upper GI endoscopy Indications:           Esophageal dysphagia Providers:             Corinn Jess Brooklyn MD, MD Referring MD:          Nancyann BRAVO. Gasper, MD (Referring MD) Medicines:             General Anesthesia Complications:         No immediate complications. Estimated blood loss: None. Procedure:             Pre-Anesthesia Assessment:                        - Prior to the procedure, a History and Physical was                         performed, and patient medications and allergies were                         reviewed. The patient is competent. The risks and                         benefits of the procedure and the sedation options and                         risks were discussed with the patient. All questions                         were answered and informed consent was obtained.                         Patient identification and proposed procedure were                         verified by the physician, the nurse, the                         anesthesiologist, the anesthetist and the technician                         in the pre-procedure area in the procedure room in the                         endoscopy suite. Mental Status Examination: alert and                         oriented. Airway Examination: normal oropharyngeal                         airway and neck mobility. Respiratory Examination:                         clear to auscultation. CV Examination: normal.  Prophylactic Antibiotics: The patient does not require                         prophylactic antibiotics. Prior Anticoagulants: The                         patient has  taken no anticoagulant or antiplatelet                         agents. ASA Grade Assessment: III - A patient with                         severe systemic disease. After reviewing the risks and                         benefits, the patient was deemed in satisfactory                         condition to undergo the procedure. The anesthesia                         plan was to use general anesthesia. Immediately prior                         to administration of medications, the patient was                         re-assessed for adequacy to receive sedatives. The                         heart rate, respiratory rate, oxygen saturations,                         blood pressure, adequacy of pulmonary ventilation, and                         response to care were monitored throughout the                         procedure. The physical status of the patient was                         re-assessed after the procedure.                        After obtaining informed consent, the endoscope was                         passed under direct vision. Throughout the procedure,                         the patient's blood pressure, pulse, and oxygen                         saturations were monitored continuously. The Endoscope                         was introduced through the mouth, and advanced to the  second part of duodenum. The upper GI endoscopy was                         accomplished without difficulty. The patient tolerated                         the procedure well. Findings:      A web was found in the upper third of the esophagus, traversed with mild       resistance after superficial mucosal tear.      The entire examined stomach was normal.      The duodenal bulb and second portion of the duodenum were normal. Impression:            - Web in the upper third of the esophagus.                        - Normal stomach.                        - Normal duodenal bulb and second  portion of the                         duodenum.                        - No specimens collected. Recommendation:        - Discharge patient to home (with escort).                        - Chopped diet indefinitely.                        - Continue present medications. Procedure Code(s):     --- Professional ---                        [phone], Esophagogastroduodenoscopy, flexible,                         transoral; diagnostic, including collection of                         specimen(s) by brushing or washing, when performed                         (separate procedure) Diagnosis Code(s):     --- Professional ---                        Q39.4, Esophageal web                        R13.14, Dysphagia, pharyngoesophageal phase CPT copyright 2022 American Medical Association. All rights reserved. The codes documented in this report are preliminary and upon coder review may  be revised to meet current compliance requirements. Dr. Corinn Brooklyn Corinn Jess Brooklyn MD, MD 03/13/2024 12:13:44 PM This report has been signed electronically. Number of Addenda: 0 Note Initiated On: 03/13/2024 11:58 AM Estimated Blood Loss:  Estimated blood loss: none. Estimated blood loss: none.      [facility]

## 2024-03-13 NOTE — H&P (Signed)
 Katelyn JONELLE Brooklyn, MD [facility]enter[facility], DHIP [address]  , [place] [address]  Main: [phone] Fax:  [phone] Pager: [phone]   Primary Care Physician:  Katelyn Nancyann BRAVO, MD Primary Gastroenterologist:  Dr. Corinn JONELLE Cooper  Pre-Procedure History & Physical: HPI:  Katelyn Cooper is a 68 y.o. female is here for an endoscopy.   Past Medical History:  Diagnosis Date   (HFpEF) heart failure with preserved ejection fraction (HCC)    a. 12/2015 Echo: EF 55-60%, nl RV size/fxn, no significant valvular abnormalities; b. 10/2017 Echo: EF 50-55%, antsept, ant HK. Gr2 DD. Mild MR. Nl RV fxn. PASP .   Actinic keratosis    Anxiety    Basal cell carcinoma 08/08/2017   L nose above mid nasal alar groove - tx with Mohs   CHF (congestive heart failure) (HCC)    Depression    Diabetes type 2, controlled (HCC)    diet-controlled   Esophageal stricture    GERD (gastroesophageal reflux disease)    Hiatal hernia    History of chicken pox    Hyperlipidemia    Hypertension    Internal hemorrhoids    Non-obstructive CAD (coronary artery disease)    a. 12/2015 MV: apical ant and apical reversible defect. EF 55-65%; b. 01/2016 Cath: LM nl, LAD nl, SP1 40ost, LCX nl, OM1 30, RCA 30p/m, EDP 15-13mmHg; c. 10/2017 Cath: LM nl, LAD nl, SP1 30ost, LCX nl, OM1 30, RCA 30p/m. EF 55%.   Obstructive sleep apnea    PAD (peripheral artery disease)    a. 1990's s/p prior LCIA stenting; b. 12/2015 ABI: R 1.05, L 1.03.   Panic disorder     Past Surgical History:  Procedure Laterality Date   AORTA SURGERY  1999   stent placement; ? left iliac artery intervention   APPENDECTOMY  1998   BASAL CELL CARCINOMA EXCISION     C5 - fusion N/A 10/10/2021   CARDIAC CATHETERIZATION N/A 01/12/2016   Procedure: Left Heart Cath and Coronary Angiography;  Surgeon: Lonni Hanson, MD;  Location: [facility] INVASIVE CV LAB;  Service: Cardiovascular;  Laterality: N/A;   COLONOSCOPY   07/17/2019   ESOPHAGEAL DILATION  02/14/2024   Procedure: DILATION, ESOPHAGUS;  Surgeon: [name], MD;  Location: [facility] ENDOSCOPY;  Service: Gastroenterology;;   ESOPHAGOGASTRODUODENOSCOPY N/A 02/14/2024   Procedure: EGD (ESOPHAGOGASTRODUODENOSCOPY);  Surgeon: [name], MD;  Location: [facility] ENDOSCOPY;  Service: Gastroenterology;  Laterality: N/A;   FOOT SURGERY     Right   pap smear  03/2010   Done by Dr. Jon Rummer, normal   REFRACTIVE SURGERY     right   RIB RESECTION  [id number]   RIGHT/LEFT HEART CATH AND CORONARY ANGIOGRAPHY N/A 11/08/2017   Procedure: RIGHT/LEFT HEART CATH AND CORONARY ANGIOGRAPHY;  Surgeon: Darron Deatrice LABOR, MD;  Location: [facility] INVASIVE CV LAB;  Service: Cardiovascular;  Laterality: N/A;   SHOULDER SURGERY  2005   left   UPPER GASTROINTESTINAL ENDOSCOPY  07/17/2019   UPPER GI ENDOSCOPY  09/19/2011   Stricture at GE junction, dilated. Mild gastritis and duodenitis. Small hiatal hernia    Prior to Admission medications  Medication Sig Start Date End Date Taking? Authorizing Provider  albuterol  (VENTOLIN  HFA) 108 (90 Base) MCG/ACT inhaler inhale 2 puffs by mouth every 6 hours if needed for wheezing or shortness of breath 09/26/22  Yes Kasa, Nickolas, MD  aspirin  EC 81 MG tablet Take 1 tablet (81 mg total) by mouth daily. 12/12/15  Yes  End, Lonni, MD  atorvastatin  (LIPITOR) 80 MG tablet Take 1 tablet by mouth daily. 09/17/23  Yes Dunn, Bernardino HERO, PA-C  doxepin  (SINEQUAN ) 10 MG capsule Take 1 capsule (10 mg total) by mouth at bedtime as needed (insomnia). 03/17/24 06/15/24 Yes Hisada, Katheren, MD  ezetimibe  (ZETIA ) 10 MG tablet Take 1 tablet by mouth daily. 06/05/23  Yes Dunn, Bernardino HERO, PA-C  furosemide  (LASIX ) 20 MG tablet Take 1 tablet by mouth every other day and may take 1 tablet by mouth  as needed for increased fluid or weight gain on days that you would normally not take a tablet. 04/18/23  Yes Dunn, Bernardino HERO, PA-C  hydrOXYzine  (ATARAX ) 25 MG tablet  Take 1 tablet (25 mg total) by mouth daily as needed for anxiety. 03/19/24 06/17/24 Yes Hisada, Katheren, MD  magnesium  oxide (MAG-OX) 400 MG tablet Take 1 tablet (400 mg total) by mouth daily. 01/14/24  Yes Dunn, Bernardino HERO, PA-C  metFORMIN  (GLUCOPHAGE -XR) 500 MG 24 hr tablet Take 2 tablets (1,000 mg total) by mouth 2 (two) times daily. 03/12/24  Yes Katelyn Nancyann BRAVO, MD  metoprolol  tartrate (LOPRESSOR ) 25 MG tablet Take 0.5 tablets (12.5 mg total) by mouth 2 (two) times daily. 08/13/23  Yes Dunn, Ryan M, PA-C  pantoprazole  (PROTONIX ) 40 MG tablet TAKE 1 TABLET BY MOUTH TWICE A DAY 03/01/24  Yes Katelyn Nancyann BRAVO, MD  potassium chloride  (MICRO-K ) 10 MEQ CR capsule Take 2 capsules (20 mEq total) by mouth every other day. Take on the same day as Lasix . 01/14/24  Yes Dunn, Bernardino HERO, PA-C  QUEtiapine  (SEROQUEL ) 100 MG tablet Take 1 tablet (100 mg total) by mouth at bedtime. 02/18/24 08/16/24 Yes Hisada, Reina, MD  venlafaxine  XR (EFFEXOR -XR) 150 MG 24 hr capsule Take 1 capsule (150 mg total) by mouth daily with breakfast. 03/19/24 06/17/24 Yes Hisada, Katheren, MD  sucralfate  (CARAFATE ) 1 g tablet Take 1 tablet (1 g total) by mouth 4 (four) times daily -  with meals and at bedtime. Patient not taking: Reported on 03/09/2024 09/04/23   Katelyn Nancyann BRAVO, MD    Allergies as of 02/19/2024 - Review Complete 02/18/2024  Allergen Reaction Noted   Mounjaro  [tirzepatide ]  11/08/2023   Lovastatin  12/23/2014   Paroxetine hcl  05/09/2011   Lamotrigine Rash 03/01/2015    Family History  Problem Relation Age of Onset   Lung cancer Mother        was a smoker   Emphysema Mother    Cancer Mother        Lung   Alcohol abuse Mother    Depression Mother    Heart disease Mother    Drug abuse Sister    Breast cancer Sister        x 2  (30&50)   Emphysema Sister    Bipolar disorder Sister    Alcohol abuse Other    Depression Other    Bipolar disorder Other    Heart disease Other    Vision loss Other    Alcohol abuse  Father    Mood Disorder Father    Heart disease Maternal Grandmother    Stroke Maternal Grandmother    Esophageal cancer Maternal Aunt    Colon cancer Neg Hx    Colon polyps Neg Hx    Rectal cancer Neg Hx    Stomach cancer Neg Hx     Social History   Socioeconomic History   Marital status: Married    Spouse name: mark   Number of children:  0   Years of education: Not on file   Highest education level: 12th grade  Occupational History   Occupation: computer-retired    Employer: [employer]  Tobacco Use   Smoking status: Former    Current packs/day: 0.00    Types: Cigarettes    Quit date: 11/16/2022    Years since quitting: 1.3   Smokeless tobacco: Never  Vaping Use   Vaping status: Former  Substance and Sexual Activity   Alcohol use: No    Alcohol/week: 0.0 standard drinks of alcohol   Drug use: No   Sexual activity: Not Currently  Other Topics Concern   Not on file  Social History Narrative   Not on file   Social Drivers of Health   Tobacco Use: Medium Risk (03/13/2024)   Patient History    Smoking Tobacco Use: Former    Smokeless Tobacco Use: Never    Passive Exposure: Not on Actuary Strain: Low Risk  (02/04/2024)   Received from [facility]   Overall Financial Resource Strain (CARDIA)    Difficulty of Paying Living Expenses: Not hard at all  Food Insecurity: No Food Insecurity (02/04/2024)   Received from [facility]   Epic    Within the past 12 months, you worried that your food would run out before you got the money to buy more.: Never true    Within the past 12 months, the food you bought just didn't last and you didn't have money to get more.: Never true  Transportation Needs: No Transportation Needs (02/04/2024)   Received from [facility] - Transportation    In the past 12 months, has lack of transportation kept you from medical appointments or from getting medications?: No     Lack of Transportation (Non-Medical): No  Physical Activity: Sufficiently Active (11/07/2023)   Exercise Vital Sign    Days of Exercise per Week: 3 days    Minutes of Exercise per Session: 130 min  Stress: No Stress Concern Present (11/07/2023)   [employer] of [employer]ational [employer] - Occupational Stress Questionnaire    Feeling of Stress: Not at all  Social Connections: Moderately Isolated (11/07/2023)   Social Connection and Isolation Panel    Frequency of Communication with Friends and Family: Once a week    Frequency of Social Gatherings with Friends and Family: Three times a week    Attends Religious Services: Never    Active Member of Clubs or Organizations: No    Attends Banker Meetings: Not on file    Marital Status: Married  Catering Manager Violence: Not At Risk (08/07/2023)   Humiliation, Afraid, Rape, and Kick questionnaire    Fear of Current or Ex-Partner: No    Emotionally Abused: No    Physically Abused: No    Sexually Abused: No  Depression (PHQ2-9): Low Risk (03/09/2024)   Depression (PHQ2-9)    PHQ-2 Score: 1  Alcohol Screen: Low Risk (01/30/2022)   Alcohol Screen    Last Alcohol Screening Score (AUDIT): 0  Housing: Low Risk  (02/04/2024)   Received from [facility]   Epic    In the last 12 months, was there a time when you were not able to pay the mortgage or rent on time?: No    In the past 12 months, how many times have you moved where you were living?: 0    At any time in the past 12 months,  were you homeless or living in a shelter (including now)?: No  Utilities: Not At Risk (02/04/2024)   Received from [facility]   Epic    In the past 12 months has the electric, gas, oil, or water company threatened to shut off services in your home?: No  Health Literacy: Not on file    Review of Systems: See HPI, otherwise negative ROS  Physical Exam: BP 129/63   Pulse 99   Temp (!) 96.7 F (35.9 C) (Temporal)    Resp 20   Ht 5' 3 (1.6 m)   Wt 67.9 kg   SpO2 93%   BMI 26.54 kg/m  General:   Alert,  pleasant and cooperative in NAD Head:  Normocephalic and atraumatic. Neck:  Supple; no masses or thyromegaly. Lungs:  Clear throughout to auscultation.    Heart:  Regular rate and rhythm. Abdomen:  Soft, nontender and nondistended. Normal bowel sounds, without guarding, and without rebound.   Neurologic:  Alert and  oriented x4;  grossly normal neurologically.  Impression/Plan: Katelyn Cooper is here for an endoscopy to be performed for dysphagia  Risks, benefits, limitations, and alternatives regarding  endoscopy have been reviewed with the patient.  Questions have been answered.  All parties agreeable.   Katelyn Brooklyn, MD  03/13/2024, 10:46 AM

## 2024-03-13 NOTE — Progress Notes (Unsigned)
 Cardiology Office Note    Date:  03/13/2024   ID:  Mystique, Bjelland 03/10/1956, MRN [id number]  PCP:  [name], MD  Cardiologist:  Lonni Hanson, MD  Electrophysiologist:  None   Chief Complaint: Follow-up  History of Present Illness:   Katelyn Cooper is a 68 y.o. female with history of nonobstructive CAD, PAD status post left iliac stenting, HFpEF, mild to moderate mitral regurgitation, vasovagal syncope, DM2, thoracic outlet syndrome status post bilateral rib resections in 1990, HLD, COPD with ongoing tobacco use, GERD complicated by esophageal stricture, OSA on CPAP, cervical radiculopathy, and anxiety/depression who presents for ***  LHC from 01/2016 demonstrated 40% stenosis in the proximal aspect of a large first septal perforator along with 30% OM1 stenosis and 30% proximal and mid RCA stenoses.  R/LHC in 10/2017 showed 30% stenosis at the first septal perforator, OM1 30% stenosis, sequential 30% proximal and mid RCA stenoses, upper normal filling pressures and normal pulmonary artery pressure with a moderately reduced cardiac output as outlined below.  Lexiscan  MPI from 03/2019 was low risk without evidence of ischemia or scar coronary artery calcification noted.  LVEF greater than 65%.  Echo from 04/2019 showed an EF of 60 to 65%, grade 1 diastolic dysfunction with elevated filling pressures, normal RV size and systolic function, no significant valvular abnormality, and normal CVP.   She was evaluated for syncope in 05/2020 that occurred after stubbing her toe with associated severe pain.  Outpatient cardiac monitor in 05/2020 showed a predominant rhythm of sinus with 5 runs of PSVT with the longest episode lasting 18.8 seconds as well as rare PACs and PVCs.  Episode of syncope was felt to be precipitated by vasovagal episode complicated by orthostatic hypotension in the setting of dehydration.   She was admitted to the hospital in 09/2021 with lower extremity weakness with  noted decreased oral intake and dysphagia following a cervical fusion in the context of cervical collar.  High-sensitivity troponin negative x2.  Blood cultures negative.  CBC consistent with hemoconcentration.  She was evaluated by orthopedics and PT with recommendation for home health PT.   She was seen in the office in 10/2021 and reported several episodes of profound lower extremity weakness leading up to her hospital admission, which was felt to be noncardiac in etiology.  Echo in 12/2021 showed an EF of 55 to 60%, septal wall hypokinesis consistent with known left bundle branch block, normal LV diastolic function parameters, normal RV systolic function and ventricular cavity size, mildly elevated PASP estimated at 36.4 mmHg, mild to moderate mitral regurgitation, and an estimated right atrial pressure of 3 mmHg.  Zio patch at that time showed a predominant rhythm of sinus with an average rate of 85 bpm (range 66 to 132 bpm), a single atrial run lasting 5 beats was noted, rare PACs and PVCs, no sustained arrhythmias or prolonged pauses.  No patient triggered events.  She was seen in the office in 05/2022 and noted bilateral thigh discomfort and weakness.  She continued to smoke 1 pack/day.  Subsequent ABIs in 07/2022 were normal and the previously placed left common iliac artery stent was patent.   She was admitted to the hospital in 08/2023 with generalized weakness after returning from [place]  with associated achiness and emesis.  She was found to have AKI with a serum creatinine of 1.91 and was hypercalcemic with a calcium  of 14.2.  CT showed no acute intracranial pathology.  CT of the abdomen/pelvis was compatible with  esophagitis and with a distended segment.  Echo showed an EF of 55 to 60%, basal to mid septal wall hypokinesis, grade 1 diastolic dysfunction, normal RV systolic function, ventricular cavity size, and RVSP, moderate mitral valve regurgitation, and an estimated right atrial pressure of 3 mmHg.   She had symptomatic improvement in AKI with the holding of furosemide  and IV fluids.   She was seen in the office on 08/13/2023 and continued to note generalized malaise and fatigue following her hospitalization.  She also had 2 near falls versus near syncopal episodes.  Zio patch in 08/2023 showed a predominant rhythm of sinus with 5 episodes of SVT lasting up to 10 beats, and rare PACs and PVCs.  Patient triggered event corresponded to sinus tachycardia.  ABIs normal bilaterally in 10/2023 with vascular ultrasound demonstrating patent iliac stent.   She was seen in the office on 12/16/2023 with abrupt onset of shortness of breath without frank angina.  Labs obtained at that time showed a mildly elevated D-dimer and hypokalemia.  In the setting she was advised to go to the ED.  She was seen in the ED on 12/16/2023 for evaluation of abrupt onset of shortness of breath and elevated D-dimer.  EKG showed sinus rhythm with LBBB.  BNP 132.  CTA chest negative for PE or other acute chest finding with aortic atherosclerosis, emphysema, and stable mediastinal and hilar lymph nodes noted.  At that time, she reported good urine output with added furosemide .  Her dyspnea was improving.  Potassium was repleted.  She was last seen in the office in 01/2024 noting improvement in dyspnea and was without symptoms of frank chest pain.  She was having significant issues with dysphagia and was evaluated by GI, undergoing EGD on 02/14/2024 with a repeat EGD on 03/13/2024.   ***   Labs independently reviewed: 03/2024 - TSH normal, A1c 8.1, TC 118, TG 184, HDL 50, LDL 38, BUN 13, serum creatinine 1.15, potassium 3.5, albumin 4.2, AST/ALT normal, Hgb 11.9, PLT 178 01/2024 - magnesium  1.6  Past Medical History:  Diagnosis Date   (HFpEF) heart failure with preserved ejection fraction (HCC)    a. 12/2015 Echo: EF 55-60%, nl RV size/fxn, no significant valvular abnormalities; b. 10/2017 Echo: EF 50-55%, antsept, ant HK. Gr2 DD. Mild  MR. Nl RV fxn. PASP .   Actinic keratosis    Anxiety    Basal cell carcinoma 08/08/2017   L nose above mid nasal alar groove - tx with Mohs   CHF (congestive heart failure) (HCC)    Depression    Diabetes type 2, controlled (HCC)    diet-controlled   Esophageal stricture    GERD (gastroesophageal reflux disease)    Hiatal hernia    History of chicken pox    Hyperlipidemia    Hypertension    Internal hemorrhoids    Non-obstructive CAD (coronary artery disease)    a. 12/2015 MV: apical ant and apical reversible defect. EF 55-65%; b. 01/2016 Cath: LM nl, LAD nl, SP1 40ost, LCX nl, OM1 30, RCA 30p/m, EDP 15-49mmHg; c. 10/2017 Cath: LM nl, LAD nl, SP1 30ost, LCX nl, OM1 30, RCA 30p/m. EF 55%.   Obstructive sleep apnea    PAD (peripheral artery disease)    a. 1990's s/p prior LCIA stenting; b. 12/2015 ABI: R 1.05, L 1.03.   Panic disorder     Past Surgical History:  Procedure Laterality Date   AORTA SURGERY  1999   stent placement; ? left iliac artery intervention  APPENDECTOMY  1998   BASAL CELL CARCINOMA EXCISION     C5 - fusion N/A 10/10/2021   CARDIAC CATHETERIZATION N/A 01/12/2016   Procedure: Left Heart Cath and Coronary Angiography;  Surgeon: Lonni Hanson, MD;  Location: [facility] INVASIVE CV LAB;  Service: Cardiovascular;  Laterality: N/A;   COLONOSCOPY  07/17/2019   ESOPHAGEAL DILATION  02/14/2024   Procedure: DILATION, ESOPHAGUS;  Surgeon: Unk Corinn Skiff, MD;  Location: [facility] ENDOSCOPY;  Service: Gastroenterology;;   ESOPHAGOGASTRODUODENOSCOPY N/A 02/14/2024   Procedure: EGD (ESOPHAGOGASTRODUODENOSCOPY);  Surgeon: Unk Corinn Skiff, MD;  Location: [facility] ENDOSCOPY;  Service: Gastroenterology;  Laterality: N/A;   FOOT SURGERY     Right   pap smear  03/2010   Done by Dr. Jon Rummer, normal   REFRACTIVE SURGERY     right   RIB RESECTION  8009,8006   RIGHT/LEFT HEART CATH AND CORONARY ANGIOGRAPHY N/A 11/08/2017   Procedure: RIGHT/LEFT HEART CATH AND CORONARY  ANGIOGRAPHY;  Surgeon: Darron Deatrice LABOR, MD;  Location: [facility] INVASIVE CV LAB;  Service: Cardiovascular;  Laterality: N/A;   SHOULDER SURGERY  2005   left   UPPER GASTROINTESTINAL ENDOSCOPY  07/17/2019   UPPER GI ENDOSCOPY  09/19/2011   Stricture at GE junction, dilated. Mild gastritis and duodenitis. Small hiatal hernia    Current Medications: Active Medications[1]  Allergies:   Mounjaro  [tirzepatide ], Lovastatin, Paroxetine hcl, and Lamotrigine   Social History   Socioeconomic History   Marital status: Married    Spouse name: mark   Number of children: 0   Years of education: Not on file   Highest education level: 12th grade  Occupational History   Occupation: computer-retired    Associate Professor: [employer]  Tobacco Use   Smoking status: Former    Current packs/day: 0.00    Types: Cigarettes    Quit date: 11/16/2022    Years since quitting: 1.3   Smokeless tobacco: Never  Vaping Use   Vaping status: Former  Substance and Sexual Activity   Alcohol use: No    Alcohol/week: 0.0 standard drinks of alcohol   Drug use: No   Sexual activity: Not Currently  Other Topics Concern   Not on file  Social History Narrative   Not on file   Social Drivers of Health   Tobacco Use: Medium Risk (03/13/2024)   Patient History    Smoking Tobacco Use: Former    Smokeless Tobacco Use: Never    Passive Exposure: Not on Actuary Strain: Low Risk  (02/04/2024)   Received from [facility]   Overall Financial Resource Strain (CARDIA)    Difficulty of Paying Living Expenses: Not hard at all  Food Insecurity: No Food Insecurity (02/04/2024)   Received from [facility]   Epic    Within the past 12 months, you worried that your food would run out before you got the money to buy more.: Never true    Within the past 12 months, the food you bought just didn't last and you didn't have money to get more.: Never true  Transportation Needs: No  Transportation Needs (02/04/2024)   Received from [facility] - Transportation    In the past 12 months, has lack of transportation kept you from medical appointments or from getting medications?: No    Lack of Transportation (Non-Medical): No  Physical Activity: Sufficiently Active (11/07/2023)   Exercise Vital Sign    Days of Exercise per Week: 3 days  Minutes of Exercise per Session: 130 min  Stress: No Stress Concern Present (11/07/2023)   [employer] - Occupational Stress Questionnaire    Feeling of Stress: Not at all  Social Connections: Moderately Isolated (11/07/2023)   Social Connection and Isolation Panel    Frequency of Communication with Friends and Family: Once a week    Frequency of Social Gatherings with Friends and Family: Three times a week    Attends Religious Services: Never    Active Member of Clubs or Organizations: No    Attends Banker Meetings: Not on file    Marital Status: Married  Depression (PHQ2-9): Low Risk (03/09/2024)   Depression (PHQ2-9)    PHQ-2 Score: 1  Alcohol Screen: Low Risk (01/30/2022)   Alcohol Screen    Last Alcohol Screening Score (AUDIT): 0  Housing: Low Risk  (02/04/2024)   Received from [facility]   Epic    In the last 12 months, was there a time when you were not able to pay the mortgage or rent on time?: No    In the past 12 months, how many times have you moved where you were living?: 0    At any time in the past 12 months, were you homeless or living in a shelter (including now)?: No  Utilities: Not At Risk (02/04/2024)   Received from [facility]   Epic    In the past 12 months has the electric, gas, oil, or water company threatened to shut off services in your home?: No  Health Literacy: Not on file     Family History:  The patient's family history includes Alcohol abuse in her father, mother, and another family member;  Bipolar disorder in her sister and another family member; Breast cancer in her sister; Cancer in her mother; Depression in her mother and another family member; Drug abuse in her sister; Emphysema in her mother and sister; Esophageal cancer in her maternal aunt; Heart disease in her maternal grandmother, mother, and another family member; Lung cancer in her mother; Mood Disorder in her father; Stroke in her maternal grandmother; Vision loss in an other family member. There is no history of Colon cancer, Colon polyps, Rectal cancer, or Stomach cancer.  ROS:   12-point review of systems is negative unless otherwise noted in the HPI.   EKGs/Labs/Other Studies Reviewed:    Studies reviewed were summarized above. The additional studies were reviewed today:  ABI 10/23/2023: ABI/TBIToday's ABIToday's TBIPrevious ABIPrevious TBI  +-------+-----------+-----------+------------+------------+  Right 1.15       0.73       1.10        0.44          +-------+-----------+-----------+------------+------------+  Left  1.05       0.97       1.12        0.79          +-------+-----------+-----------+------------+------------+   Bilateral ABIs appear essentially unchanged. Bilateral TBIs appear  increased.    Summary:  Right: Resting right ankle-brachial index is within normal range. The  right toe-brachial index is normal.   Left: Resting left ankle-brachial index is within normal range. The left  toe-brachial index is normal.  __________   Aortoiliac ultrasound 10/23/2023: Summary:  Abdominal Aorta: No evidence of an abdominal aortic aneurysm was  visualized. The largest aortic measurement is 1.9 cm. Previous diameter  measurement was 1.2 cm obtained on 07/30/22. Not well visualized on  previous  study.   Patent left commom iliac arery stent.   IVC/Iliac: There is no evidence of thrombus involving the IVC.  __________   Zio patch 08/2023:   The patient was monitored for 13 days,  23 hours.   The predominant rhythm was sinus with an average rate of 81 bpm (range 62-130 bpm in sinus).   There were rare PACs and PVCs.   Five supraventricular runs were observed, lasting up to 10 beats with a maximum rate of 162 bpm.   No sustained arrhythmia or prolonged pause was seen.   Patient triggered event corresponded to sinus tachycardia.   Predominantly sinus rhythm with rare PACs and PVCs as well as a few brief supraventricular runs. __________   2D echo 08/07/2023: 1. Left ventricular ejection fraction, by estimation, is 55 to 60%. Left  ventricular ejection fraction by PLAX is 58 %. The left ventricle has  normal function. The left ventricle demonstrates regional wall motion  abnormalities (basal to mid septal wall  hypokinesis). Left ventricular diastolic parameters are consistent with  Grade I diastolic dysfunction (impaired relaxation).   2. Right ventricular systolic function is normal. The right ventricular  size is normal. There is normal pulmonary artery systolic pressure. The  estimated right ventricular systolic pressure is 27.1 mmHg.   3. The mitral valve is normal in structure. Moderate mitral valve  regurgitation. No evidence of mitral stenosis.   4. The aortic valve is tricuspid. Aortic valve regurgitation is not  visualized. No aortic stenosis is present.   5. The inferior vena cava is normal in size with greater than 50%  respiratory variability, suggesting right atrial pressure of 3 mmHg.  __________   2D echo 12/01/2021: 1. Left ventricular ejection fraction, by estimation, is 55 to 60%. The  left ventricle has normal function. Septal wall hypokinesis consistent  with bundle branch block. Left ventricular diastolic parameters were  normal.   2. Right ventricular systolic function is normal. The right ventricular  size is normal. There is mildly elevated pulmonary artery systolic  pressure. The estimated right ventricular systolic pressure is 36.4 mmHg.    3. The mitral valve is normal in structure. Mild to moderate mitral valve  regurgitation. No evidence of mitral stenosis.   4. The aortic valve is normal in structure. Aortic valve regurgitation is  not visualized. No aortic stenosis is present.   5. The inferior vena cava is normal in size with greater than 50%  respiratory variability, suggesting right atrial pressure of 3 mmHg.  __________   Zio patch 10/2021:   The patient was monitored for 13 days, 14 hours.   The predominant rhythm was sinus with an average rate of 85 bpm (range 66-132 bpm).   There were rare PACs and PVCs.  A single atrial run lasting 5 beats occurred, with a maximum rate of 111 bpm.   No sustained arrhythmia or prolonged pause was identified.   There were no patient triggered events.   Predominantly sinus rhythm with rare PACs and PVCs as well as a single brief supraventricular run.  No significant arrhythmia observed. __________   Zio patch 05/2020: The patient was monitored for 13 days, 19 hours. The predominant rhythm was sinus with an average rate of 84 bpm (range 65-143 bpm in sinus). There were rare PAC's and PVC's. Five atrial runs lasting up to 18.8 seconds occurred with a maximum rate of 156 bpm. No sustained arrhythmia or prolonged pause was identified. There were no patient triggered events.  Predominantly sinus rhythm with a few episodes of PSVT as well as rare PAC's and PVC's. __________   2D echo 04/14/2019: 1. Left ventricular ejection fraction, by visual estimation, is 60 to  65%. The left ventricle has normal function. There is no left ventricular  hypertrophy.   2. Elevated left atrial pressure.   3. Left ventricular diastolic parameters are consistent with Grade I  diastolic dysfunction (impaired relaxation).   4. The left ventricle has no regional wall motion abnormalities.   5. Global right ventricle has normal systolic function.The right  ventricular size is normal. No increase in  right ventricular wall  thickness.   6. Left atrial size was normal.   7. Right atrial size was normal.   8. The mitral valve is normal in structure. Trivial mitral valve  regurgitation. No evidence of mitral stenosis.   9. The tricuspid valve is not well visualized.  10. The aortic valve was not well visualized. Aortic valve regurgitation  is not visualized. No evidence of aortic valve sclerosis or stenosis.  11. The pulmonic valve was not well visualized. Pulmonic valve  regurgitation is not visualized.  12. TR signal is inadequate for assessing pulmonary artery systolic  pressure.  13. The inferior vena cava is normal in size with greater than 50%  respiratory variability, suggesting right atrial pressure of 3 mmHg.  14. The interatrial septum was not well visualized.   In comparison to the previous echocardiogram(s): Anterior/anterior septum  hypokinesis seen on previous test 11/06/2017. EF 50-55%. __________   Lexiscan  MPI 03/20/2019: Pharmacological myocardial perfusion imaging study with no significant  ischemia Normal wall motion, EF estimated at 85% No EKG changes concerning for ischemia at peak stress or in recovery. CT attenuation correction images with mild coronary calcification at bifurcation of LAD/left circumflex, moderate calcification of the descending aorta Resting EKG with sinus rhythm, left bundle branch block Low risk scan __________   [facility] 11/08/2017: Ost 1st Sept lesion is 30% stenosed. 1st Mrg lesion is 30% stenosed. Prox RCA lesion is 30% stenosed. Mid RCA lesion is 30% stenosed. The left ventricular systolic function is normal. LV end diastolic pressure is mildly elevated.   1.  Mild nonobstructive coronary artery disease. 2.  Normal LV systolic function with an EF of 55% with borderline mid anterior wall hypokinesis likely due to IVCD. 3.  Right heart catheterization showed high normal filling pressures, normal pulmonary pressure and moderately reduced  cardiac output.  Pulmonary capillary wedge pressure was 11 mmHg, PA pressure was 27 over 11 mmHg, cardiac output was 3.1 with a cardiac index of 1.8.   Recommendations: Continue medical therapy for diastolic heart failure.  Volume status has improved close to normal and thus I am going to switch furosemide  to oral. __________   2D echo 11/06/2017: - Left ventricle: The cavity size was normal. Wall thickness was    normal. Systolic function was normal. The estimated ejection    fraction was in the range of 50% to 55%. There is hypokinesis of    the anteroseptal and anterior myocardium. Features are consistent    with a pseudonormal left ventricular filling pattern, with    concomitant abnormal relaxation and increased filling pressure    (grade 2 diastolic dysfunction). Doppler parameters are    consistent with high ventricular filling pressure.  - Mitral valve: There was mild regurgitation.  - Right ventricle: The cavity size was normal. Wall thickness was    normal. Systolic function was normal.  - Pulmonary arteries: Systolic  pressure was mildly increased,    estimated to be 35 mm Hg.   Impressions:   - Compared with prior echo from 12/28/15, anterior/anteroseptal    hypokinesis is new. __________   [facility] 01/12/2016: Conclusions: Mild, non-obstructive coronary artery disease.  There is slightly sluggish flow throughout the coronary arteries, which may reflect microvascular dysfunction. Upper normal to mildly elevated left ventricular filling pressure.   Recommendations Continue risk factor modification. Uptitrate metoprolol  and continue long-acting nitrate for possible component of microvascular dysfunction. Outpatient follow-up with myself. __________   2D echo 12/28/2015: - Procedure narrative: Transthoracic echocardiography. Image    quality was fair. The study was technically difficult.  - Left ventricle: The cavity size was normal. Wall thickness was    normal. Systolic  function was normal. The estimated ejection    fraction was in the range of 55% to 60%. Wall motion was normal    grossly; there were no regional wall motion abnormalities. Left    ventricular diastolic function parameters were normal for the    patient&'s age. __________   Nuclear stress test 12/23/2015: There was no ST segment deviation noted during stress. Defect 1: There is a small defect of mild severity present in the apical anterior and apex location. Findings suggestive small area of ischemia. However, shifting breast atteunation artifact can not be excluded. This is a low risk study. The left ventricular ejection fraction is normal (55-65%). TID was present.   EKG:  EKG is ordered today.  The EKG ordered today demonstrates ***  Recent Labs: 12/16/2023: B Natriuretic Peptide 132.4 01/13/2024: Magnesium  1.6 03/09/2024: ALT 29; BUN 13; Creatinine, Ser 1.15; Hemoglobin 11.9; Platelets 178; Potassium 3.5; Sodium 138; TSH 3.230  Recent Lipid Panel    Component Value Date/Time   CHOL 118 03/09/2024 0846   TRIG 184 (H) 03/09/2024 0846   HDL 50 03/09/2024 0846   CHOLHDL 2.4 03/09/2024 0846   CHOLHDL 3.7 05/08/2022 0914   VLDL 39 05/08/2022 0914   LDLCALC 38 03/09/2024 0846   LDLDIRECT 57 02/25/2023 1016   LDLDIRECT 104 (H) 05/08/2022 0914    PHYSICAL EXAM:    VS:  There were no vitals taken for this visit.  BMI: There is no height or weight on file to calculate BMI.  Physical Exam  Wt Readings from Last 3 Encounters:  03/13/24 149 lb 12.8 oz (67.9 kg)  03/09/24 148 lb 12.8 oz (67.5 kg)  02/14/24 150 lb (68 kg)     ASSESSMENT & PLAN:   Nonobstructive CAD:  HFpEF/pulmonary hypertension:  Mitral regurgitation:  PAD:  HLD: LDL 38 in 03/2024.  PSVT:  LBBB:  COPD with prior tobacco use:   {Are you ordering a CV Procedure (e.g. stress test, cath, DCCV, TEE, etc)?   Press F2        :[id number]}     Disposition: F/u with Dr. Mady or an APP in  ***.   Medication Adjustments/Labs and Tests Ordered: Current medicines are reviewed at length with the patient today.  Concerns regarding medicines are outlined above. Medication changes, Labs and Tests ordered today are summarized above and listed in the Patient Instructions accessible in Encounters.   Bonney Bernardino Bring, PA-C 03/13/2024 12:27 PM     [facility] [place] [address] [place], [place] [address] [phone]     [1]  No outpatient medications have been marked as taking for the 03/20/24 encounter (Appointment) with Bring Bernardino HERO, PA-C.

## 2024-03-13 NOTE — Anesthesia Preprocedure Evaluation (Signed)
 Anesthesia Evaluation  Patient identified by MRN, date of birth, ID band Patient awake    Reviewed: Allergy & Precautions, H&P , NPO status , Patient's Chart, lab work & pertinent test results  Airway Mallampati: II  TM Distance: >3 FB Neck ROM: full    Dental  (+) Edentulous Lower, Edentulous Upper   Pulmonary COPD, former smoker   Pulmonary exam normal        Cardiovascular Exercise Tolerance: Good hypertension, + CAD, + Peripheral Vascular Disease and +CHF  Normal cardiovascular exam     Neuro/Psych  PSYCHIATRIC DISORDERS  Depression    negative neurological ROS     GI/Hepatic negative GI ROS, Neg liver ROS,,,  Endo/Other  diabetes, Type 2    Renal/GU negative Renal ROS  negative genitourinary   Musculoskeletal   Abdominal Normal abdominal exam  (+)   Peds  Hematology negative hematology ROS (+)   Anesthesia Other Findings Past Medical History: No date: (HFpEF) heart failure with preserved ejection fraction (HCC)     Comment:  a. 12/2015 Echo: EF 55-60%, nl RV size/fxn, no               significant valvular abnormalities; b. 10/2017 Echo: EF               50-55%, antsept, ant HK. Gr2 DD. Mild MR. Nl RV fxn. PASP              . No date: Actinic keratosis No date: Anxiety 08/08/2017: Basal cell carcinoma     Comment:  L nose above mid nasal alar groove - tx with Mohs No date: CHF (congestive heart failure) (HCC) No date: Depression No date: Diabetes type 2, controlled (HCC)     Comment:  diet-controlled No date: Esophageal stricture No date: GERD (gastroesophageal reflux disease) No date: Hiatal hernia No date: History of chicken pox No date: Hyperlipidemia No date: Hypertension No date: Internal hemorrhoids No date: Non-obstructive CAD (coronary artery disease)     Comment:  a. 12/2015 MV: apical ant and apical reversible defect.               EF 55-65%; b. 01/2016 Cath: LM nl, LAD nl, SP1 40ost,  LCX              nl, OM1 30, RCA 30p/m, EDP 15-32mmHg; c. 10/2017 Cath: LM               nl, LAD nl, SP1 30ost, LCX nl, OM1 30, RCA 30p/m. EF 55%. No date: Obstructive sleep apnea No date: PAD (peripheral artery disease)     Comment:  a. 1990's s/p prior LCIA stenting; b. 12/2015 ABI: R               1.05, L 1.03. No date: Panic disorder  Past Surgical History: 1999: AORTA SURGERY     Comment:  stent placement; ? left iliac artery intervention 1998: APPENDECTOMY No date: BASAL CELL CARCINOMA EXCISION 10/10/2021: C5 - fusion; N/A 01/12/2016: CARDIAC CATHETERIZATION; N/A     Comment:  Procedure: Left Heart Cath and Coronary Angiography;                Surgeon: Lonni Hanson, MD;  Location: [facility] INVASIVE CV               LAB;  Service: Cardiovascular;  Laterality: N/A; 07/17/2019: COLONOSCOPY No date: FOOT SURGERY     Comment:  Right 03/2010: pap smear     Comment:  Done by Dr. Jon Rummer,  normal No date: REFRACTIVE SURGERY     Comment:  right 8009,8006: RIB RESECTION 11/08/2017: RIGHT/LEFT HEART CATH AND CORONARY ANGIOGRAPHY; N/A     Comment:  Procedure: RIGHT/LEFT HEART CATH AND CORONARY               ANGIOGRAPHY;  Surgeon: Darron Deatrice LABOR, MD;  Location:               [facility] INVASIVE CV LAB;  Service: Cardiovascular;                Laterality: N/A; 2005: SHOULDER SURGERY     Comment:  left 07/17/2019: UPPER GASTROINTESTINAL ENDOSCOPY 09/19/2011: UPPER GI ENDOSCOPY     Comment:  Stricture at GE junction, dilated. Mild gastritis and               duodenitis. Small hiatal hernia  BMI    Body Mass Index: 26.57 kg/m      Reproductive/Obstetrics negative OB ROS                              Anesthesia Physical Anesthesia Plan  ASA: 3  Anesthesia Plan: General   Post-op Pain Management: Minimal or no pain anticipated   Induction: Intravenous  PONV Risk Score and Plan: Propofol infusion and TIVA  Airway Management Planned: Natural  Airway  Additional Equipment:   Intra-op Plan:   Post-operative Plan:   Informed Consent: I have reviewed the patients History and Physical, chart, labs and discussed the procedure including the risks, benefits and alternatives for the proposed anesthesia with the patient or authorized representative who has indicated his/her understanding and acceptance.     Dental Advisory Given  Plan Discussed with: CRNA and Surgeon  Anesthesia Plan Comments:          Anesthesia Quick Evaluation

## 2024-03-20 ENCOUNTER — Ambulatory Visit: Attending: Physician Assistant | Admitting: Physician Assistant

## 2024-03-20 ENCOUNTER — Encounter: Payer: Self-pay | Admitting: Physician Assistant

## 2024-03-20 VITALS — BP 100/50 | HR 88 | Ht 63.0 in | Wt 149.6 lb

## 2024-03-20 DIAGNOSIS — J449 Chronic obstructive pulmonary disease, unspecified: Secondary | ICD-10-CM

## 2024-03-20 DIAGNOSIS — E785 Hyperlipidemia, unspecified: Secondary | ICD-10-CM

## 2024-03-20 DIAGNOSIS — I447 Left bundle-branch block, unspecified: Secondary | ICD-10-CM

## 2024-03-20 DIAGNOSIS — I5032 Chronic diastolic (congestive) heart failure: Secondary | ICD-10-CM

## 2024-03-20 DIAGNOSIS — I739 Peripheral vascular disease, unspecified: Secondary | ICD-10-CM

## 2024-03-20 DIAGNOSIS — A630 Anogenital (venereal) warts: Secondary | ICD-10-CM | POA: Diagnosis not present

## 2024-03-20 DIAGNOSIS — I471 Supraventricular tachycardia, unspecified: Secondary | ICD-10-CM

## 2024-03-20 DIAGNOSIS — Z79899 Other long term (current) drug therapy: Secondary | ICD-10-CM

## 2024-03-20 DIAGNOSIS — I2510 Atherosclerotic heart disease of native coronary artery without angina pectoris: Secondary | ICD-10-CM | POA: Diagnosis not present

## 2024-03-20 DIAGNOSIS — I340 Nonrheumatic mitral (valve) insufficiency: Secondary | ICD-10-CM

## 2024-03-20 DIAGNOSIS — R9230 Dense breasts, unspecified: Secondary | ICD-10-CM | POA: Diagnosis not present

## 2024-03-20 DIAGNOSIS — I272 Pulmonary hypertension, unspecified: Secondary | ICD-10-CM | POA: Diagnosis not present

## 2024-03-20 MED ORDER — POTASSIUM CHLORIDE ER 10 MEQ PO CPCR: 20.0000 meq | ORAL_CAPSULE | ORAL | 3 refills | Status: AC

## 2024-03-20 MED ORDER — FUROSEMIDE 20 MG PO TABS: 20.0000 mg | ORAL_TABLET | ORAL | 3 refills | Status: AC

## 2024-03-20 NOTE — Patient Instructions (Signed)
 Medication Instructions:  Your physician recommends the following medication changes.  START TAKING: Lasix  20 mg every Monday, Wednesday, and Friday  Potassium Chloride  20 mg every Monday, Wednesday, and Friday   *If you need a refill on your cardiac medications before your next appointment, please call your pharmacy*  Lab Work: Your provider would like for you to return in 10 days to have the following labs drawn: BMeT.   Please go to [facility] [address] ([facility]) [address], [place] [address] You do not need an appointment.  They are open from 8 am- 4:30 pm.  Lunch from 1:00 pm- 2:00 pm You DO NOT need to be fasting.   You may also go to one of the following [facility]:  [address] [place], [place] [address] Phone: [phone] Lab hours: Mon-Fri 8 am- 5 pm    Lunch 12 pm- 1 pm  [address] [place],  [place]  [address]  [place]  Phone: [phone] Lab hours: 7 am- 4 pm Lunch 12 pm-1 pm   [address] [place],  [place]  [address]  [place]  Phone: [phone] Lab hours: Mon-Fri 8 am- 5 pm    Lunch 12 pm- 1 pm  If you have labs (blood work) drawn today and your tests are completely normal, you will receive your results only by: MyChart Message (if you have MyChart) OR A paper copy in the mail If you have any lab test that is abnormal or we need to change your treatment, we will call you to review the results.  Follow-Up: At [facility], you and your health needs are our priority.  As part of our continuing mission to provide you with exceptional heart care, our providers are all part of one team.  This team includes your primary Cardiologist (physician) and Advanced Practice Providers or APPs (Physician Assistants and Nurse Practitioners) who all work together to provide you with the care you need, when you need it.  Your next appointment:   6 month(s)  Provider:   You may see Lonni Hanson, MD or Bernardino Bring, PA-C

## 2024-03-21 NOTE — Anesthesia Postprocedure Evaluation (Signed)
"   Anesthesia Post Note  Patient: Katelyn Cooper [facility]  Procedure(s) Performed: EGD (ESOPHAGOGASTRODUODENOSCOPY)  Patient location during evaluation: Endoscopy Anesthesia Type: General Level of consciousness: awake and alert Pain management: pain level controlled Vital Signs Assessment: post-procedure vital signs reviewed and stable Respiratory status: spontaneous breathing, nonlabored ventilation, respiratory function stable and patient connected to nasal cannula oxygen Cardiovascular status: blood pressure returned to baseline and stable Postop Assessment: no apparent nausea or vomiting Anesthetic complications: no   No notable events documented.   Last Vitals:  Vitals:   03/13/24 1233 03/13/24 1243  BP: (!) 115/56 111/62  Pulse: 78 79  Resp: 13 15  Temp:    SpO2: 100% 93%    Last Pain:  Vitals:   03/16/24 0728  TempSrc:   PainSc: 0-No pain                 Prentice Murphy      "

## 2024-04-01 DIAGNOSIS — Z79899 Other long term (current) drug therapy: Secondary | ICD-10-CM | POA: Diagnosis not present

## 2024-04-02 LAB — BASIC METABOLIC PANEL WITH GFR
BUN/Creatinine Ratio: 12 (ref 12–28)
BUN: 12 mg/dL (ref 8–27)
CO2: 21 mmol/L (ref 20–29)
Calcium: 9.8 mg/dL (ref 8.7–10.3)
Chloride: 101 mmol/L (ref 96–106)
Creatinine, Ser: 1.02 mg/dL — ABNORMAL HIGH (ref 0.57–1.00)
Glucose: 158 mg/dL — ABNORMAL HIGH (ref 70–99)
Potassium: 3.9 mmol/L (ref 3.5–5.2)
Sodium: 139 mmol/L (ref 134–144)
eGFR: 60 mL/min/1.73

## 2024-04-03 ENCOUNTER — Ambulatory Visit: Payer: Self-pay | Admitting: Student
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
## 2024-04-13 ENCOUNTER — Telehealth: Payer: Self-pay | Admitting: Family Medicine

## 2024-04-13 ENCOUNTER — Other Ambulatory Visit: Payer: Self-pay

## 2024-04-13 DIAGNOSIS — E119 Type 2 diabetes mellitus without complications: Secondary | ICD-10-CM

## 2024-04-13 MED ORDER — METFORMIN HCL ER 500 MG PO TB24: ORAL_TABLET | ORAL | 1 refills | Status: AC

## 2024-04-13 NOTE — Telephone Encounter (Signed)
 Copied from CRM [id number]. Topic: Clinical - Prescription Issue >> Apr 13, 2024 11:18 AM Nessti S wrote: Reason for CRM: pt called in because she wanted to make pcp aware that her script for metFORMIN  (GLUCOPHAGE -XR) 500 MG 24 hr tablet is to be increased to 3x a day per his requests. She would like a new script for metFORMIN  (GLUCOPHAGE -XR) 500 MG 24 hr tablet

## 2024-04-14 ENCOUNTER — Ambulatory Visit (INDEPENDENT_AMBULATORY_CARE_PROVIDER_SITE_OTHER): Admitting: Psychiatry

## 2024-04-14 ENCOUNTER — Encounter: Payer: Self-pay | Admitting: Psychiatry

## 2024-04-14 ENCOUNTER — Other Ambulatory Visit: Payer: Self-pay

## 2024-04-14 VITALS — BP 102/60 | HR 99 | Temp 97.8°F | Ht 63.0 in | Wt 148.8 lb

## 2024-04-14 DIAGNOSIS — F3341 Major depressive disorder, recurrent, in partial remission: Secondary | ICD-10-CM | POA: Diagnosis not present

## 2024-04-14 DIAGNOSIS — G4700 Insomnia, unspecified: Secondary | ICD-10-CM

## 2024-04-14 DIAGNOSIS — F411 Generalized anxiety disorder: Secondary | ICD-10-CM | POA: Diagnosis not present

## 2024-04-14 NOTE — Patient Instructions (Signed)
 Continue venlafaxine  150 mg daily Continue quetiapine  100 mg at night  Hold hydroxyzine   Continue doxepin  10 mg at night as needed for insomnia-

## 2024-04-17 ENCOUNTER — Ambulatory Visit: Admitting: Physician Assistant

## 2024-04-17 DIAGNOSIS — I471 Supraventricular tachycardia, unspecified: Secondary | ICD-10-CM

## 2024-04-17 DIAGNOSIS — I2510 Atherosclerotic heart disease of native coronary artery without angina pectoris: Secondary | ICD-10-CM

## 2024-04-17 DIAGNOSIS — F3170 Bipolar disorder, currently in remission, most recent episode unspecified: Secondary | ICD-10-CM

## 2024-04-17 DIAGNOSIS — J432 Centrilobular emphysema: Secondary | ICD-10-CM

## 2024-04-17 DIAGNOSIS — E1151 Type 2 diabetes mellitus with diabetic peripheral angiopathy without gangrene: Secondary | ICD-10-CM

## 2024-04-17 DIAGNOSIS — I10 Essential (primary) hypertension: Secondary | ICD-10-CM

## 2024-04-17 DIAGNOSIS — E7800 Pure hypercholesterolemia, unspecified: Secondary | ICD-10-CM

## 2024-04-17 DIAGNOSIS — K219 Gastro-esophageal reflux disease without esophagitis: Secondary | ICD-10-CM

## 2024-04-17 DIAGNOSIS — E1169 Type 2 diabetes mellitus with other specified complication: Secondary | ICD-10-CM

## 2024-04-17 DIAGNOSIS — M8580 Other specified disorders of bone density and structure, unspecified site: Secondary | ICD-10-CM

## 2024-04-17 DIAGNOSIS — E559 Vitamin D deficiency, unspecified: Secondary | ICD-10-CM

## 2024-04-17 DIAGNOSIS — I739 Peripheral vascular disease, unspecified: Secondary | ICD-10-CM

## 2024-04-17 DIAGNOSIS — F3341 Major depressive disorder, recurrent, in partial remission: Secondary | ICD-10-CM

## 2024-04-17 DIAGNOSIS — G4733 Obstructive sleep apnea (adult) (pediatric): Secondary | ICD-10-CM

## 2024-04-17 DIAGNOSIS — D696 Thrombocytopenia, unspecified: Secondary | ICD-10-CM

## 2024-04-17 DIAGNOSIS — Z1231 Encounter for screening mammogram for malignant neoplasm of breast: Secondary | ICD-10-CM

## 2024-04-20 ENCOUNTER — Encounter: Payer: Self-pay | Admitting: Family Medicine

## 2024-04-20 ENCOUNTER — Ambulatory Visit: Admitting: Family Medicine

## 2024-04-20 VITALS — BP 115/48 | HR 85 | Resp 16 | Ht 63.0 in | Wt 150.0 lb

## 2024-04-20 DIAGNOSIS — E119 Type 2 diabetes mellitus without complications: Secondary | ICD-10-CM | POA: Diagnosis not present

## 2024-04-20 DIAGNOSIS — S46911A Strain of unspecified muscle, fascia and tendon at shoulder and upper arm level, right arm, initial encounter: Secondary | ICD-10-CM

## 2024-04-20 DIAGNOSIS — L309 Dermatitis, unspecified: Secondary | ICD-10-CM | POA: Diagnosis not present

## 2024-04-20 MED ORDER — CLOTRIMAZOLE-BETAMETHASONE 1-0.05 % EX CREA: 1.0000 | TOPICAL_CREAM | Freq: Two times a day (BID) | CUTANEOUS | 0 refills | Status: AC

## 2024-04-20 MED ORDER — MUPIROCIN 2 % EX OINT: 1.0000 | TOPICAL_OINTMENT | Freq: Two times a day (BID) | CUTANEOUS | 0 refills | Status: AC

## 2024-04-20 NOTE — Progress Notes (Signed)
 "     Established patient visit   Patient: Katelyn Cooper   DOB: [birth year]   69 y.o. Female  MRN: [id number] Visit Date: 04/20/2024  Today's healthcare provider: Nancyann Perry, MD   Chief Complaint  Patient presents with   Acute Visit    Peeling skin on rt foot (pt has been dealing with this several wk)   Subjective    Discussed the use of AI scribe software for clinical note transcription with the patient, who gave verbal consent to proceed.  History of Present Illness   Katelyn Cooper is a 69 year old female with diabetes who presents with a persistent foot rash.  She has had a rash on her foot for approximately three weeks. Initially, she attributed it to dry skin exacerbated by cold weather and attempted treatment with Neosporin, which was ineffective. She then used an athlete's foot fungus medication for about ten days without improvement. The rash, initially localized to one foot, has started to spread and is now causing pain, particularly when wearing socks and shoes.  She is concerned about the rash due to her history of diabetes. She typically uses Vaseline lotion on her feet, especially during dry and cold conditions, and wears socks and house shoes at home. She ensures her feet are well-dried after bathing.  Additionally, she mentions a fall in the bathroom a couple of weeks ago while attempting to apply medication to her foot, resulting in injury to her tailbone and shoulder. The shoulder pain has persisted for about a week, affecting her ability to engage in activities like sewing. She has been using Salonpas patches with lidocaine  for pain relief. She can move her shoulder but experiences pain with movement.  She reported that her feet started to itch, particularly on her big foot. She has pain in her foot and shoulder, with the foot pain worsening when wearing socks and shoes.       Medications: Show/hide medication list[1] Review of Systems     Objective     BP (!) 115/48 (BP Location: Left Arm, Patient Position: Sitting, Cuff Size: Normal)   Pulse 85   Resp 16   Ht 5' 3 (1.6 m)   Wt 150 lb (68 kg)   SpO2 99%   BMI 26.57 kg/m   Physical Exam  Patches of dry flaky skin and a few skin fissures on toes and side of feet.    Assessment & Plan        Foot dermatitis in type 2 diabetes mellitus Foot dermatitis with skin splitting and flaking, unresponsive to initial treatment. Painful with socks and shoes. Risk of complications due to diabetes. - Prescribed Lotrisone  for intact skin. - Prescribed Bactroban  for split skin.  Shoulder muscle strain Shoulder pain post-fall, likely muscle strain with possible micro tear. Pain affects daily activities. - Apply heat to shoulder 2-3 times daily. - Consider menthol-based balm at night.         Nancyann Perry, MD  [facility] [phone] (phone) [phone] (fax)  [facility]    [1]  Outpatient Medications Prior to Visit  Medication Sig   albuterol  (VENTOLIN  HFA) 108 (90 Base) MCG/ACT inhaler inhale 2 puffs by mouth every 6 hours if needed for wheezing or shortness of breath   aspirin  EC 81 MG tablet Take 1 tablet (81 mg total) by mouth daily.   atorvastatin  (LIPITOR) 80 MG tablet Take 1 tablet by mouth daily.   doxepin  (SINEQUAN ) 10  MG capsule Take 1 capsule (10 mg total) by mouth at bedtime as needed (insomnia).   ezetimibe  (ZETIA ) 10 MG tablet Take 1 tablet by mouth daily.   furosemide  (LASIX ) 20 MG tablet Take 1 tablet (20 mg total) by mouth every Monday, Wednesday, and Friday.   magnesium  oxide (MAG-OX) 400 MG tablet Take 1 tablet (400 mg total) by mouth daily.   metFORMIN  (GLUCOPHAGE -XR) 500 MG 24 hr tablet Take 1 PO every am and 2 PO every PM   metoprolol  tartrate (LOPRESSOR ) 25 MG tablet Take 0.5 tablets (12.5 mg total) by mouth 2 (two) times daily.   nystatin  (MYCOSTATIN ) 100000 UNIT/ML suspension Take 5 mLs (500,000 Units total) by  mouth 4 (four) times daily.   pantoprazole  (PROTONIX ) 40 MG tablet TAKE 1 TABLET BY MOUTH TWICE A DAY   potassium chloride  (MICRO-K ) 10 MEQ CR capsule Take 2 capsules (20 mEq total) by mouth every Monday, Wednesday, and Friday. Take on the same day as Lasix .   QUEtiapine  (SEROQUEL ) 100 MG tablet Take 1 tablet (100 mg total) by mouth at bedtime.   venlafaxine  XR (EFFEXOR -XR) 150 MG 24 hr capsule Take 1 capsule (150 mg total) by mouth daily with breakfast.   No facility-administered medications prior to visit.   "

## 2024-04-27 ENCOUNTER — Other Ambulatory Visit: Payer: Self-pay | Admitting: Psychiatry

## 2024-04-27 DIAGNOSIS — F3341 Major depressive disorder, recurrent, in partial remission: Secondary | ICD-10-CM

## 2024-05-02 ENCOUNTER — Other Ambulatory Visit: Payer: Self-pay | Admitting: Psychiatry

## 2024-06-08 ENCOUNTER — Ambulatory Visit: Admitting: Psychiatry

## 2024-07-08 ENCOUNTER — Ambulatory Visit: Admitting: Family Medicine

## 2024-07-15 ENCOUNTER — Ambulatory Visit: Admitting: Internal Medicine

## 2024-09-21 ENCOUNTER — Ambulatory Visit: Admitting: Physician Assistant

## 2025-01-27 ENCOUNTER — Ambulatory Visit: Admitting: Dermatology
